# Patient Record
Sex: Female | Born: 1945 | Race: White | Hispanic: No | Marital: Married | Smoking: Former smoker
Health system: Southern US, Community
[De-identification: ages and names within clinical notes are randomized; demographics above are authoritative.]

## PROBLEM LIST (undated history)

## (undated) ENCOUNTER — Emergency Department (HOSPITAL_COMMUNITY): Payer: PPO | Source: Home / Self Care

## (undated) DIAGNOSIS — D649 Anemia, unspecified: Secondary | ICD-10-CM

## (undated) DIAGNOSIS — D329 Benign neoplasm of meninges, unspecified: Secondary | ICD-10-CM

## (undated) DIAGNOSIS — R918 Other nonspecific abnormal finding of lung field: Secondary | ICD-10-CM

## (undated) DIAGNOSIS — K900 Celiac disease: Secondary | ICD-10-CM

## (undated) DIAGNOSIS — R931 Abnormal findings on diagnostic imaging of heart and coronary circulation: Secondary | ICD-10-CM

## (undated) DIAGNOSIS — R9431 Abnormal electrocardiogram [ECG] [EKG]: Secondary | ICD-10-CM

## (undated) DIAGNOSIS — M810 Age-related osteoporosis without current pathological fracture: Secondary | ICD-10-CM

## (undated) DIAGNOSIS — Z8701 Personal history of pneumonia (recurrent): Secondary | ICD-10-CM

## (undated) DIAGNOSIS — Z9221 Personal history of antineoplastic chemotherapy: Secondary | ICD-10-CM

## (undated) DIAGNOSIS — R0600 Dyspnea, unspecified: Secondary | ICD-10-CM

## (undated) DIAGNOSIS — E785 Hyperlipidemia, unspecified: Secondary | ICD-10-CM

## (undated) DIAGNOSIS — I700 Atherosclerosis of aorta: Secondary | ICD-10-CM

## (undated) DIAGNOSIS — Z923 Personal history of irradiation: Secondary | ICD-10-CM

## (undated) DIAGNOSIS — B351 Tinea unguium: Secondary | ICD-10-CM

## (undated) DIAGNOSIS — C801 Malignant (primary) neoplasm, unspecified: Secondary | ICD-10-CM

## (undated) HISTORY — DX: Atherosclerosis of aorta: I70.0

## (undated) HISTORY — DX: Age-related osteoporosis without current pathological fracture: M81.0

## (undated) HISTORY — PX: ESOPHAGOGASTRODUODENOSCOPY: SHX1529

## (undated) HISTORY — DX: Abnormal electrocardiogram (ECG) (EKG): R94.31

## (undated) HISTORY — PX: LUNG LOBECTOMY: SHX167

## (undated) HISTORY — DX: Other nonspecific abnormal finding of lung field: R91.8

## (undated) HISTORY — PX: COLONOSCOPY: SHX174

## (undated) HISTORY — DX: Tinea unguium: B35.1

## (undated) HISTORY — DX: Benign neoplasm of meninges, unspecified: D32.9

## (undated) HISTORY — DX: Hyperlipidemia, unspecified: E78.5

## (undated) HISTORY — DX: Anemia, unspecified: D64.9

## (undated) HISTORY — DX: Abnormal findings on diagnostic imaging of heart and coronary circulation: R93.1

## (undated) HISTORY — DX: Personal history of pneumonia (recurrent): Z87.01

---

## 2009-05-26 ENCOUNTER — Ambulatory Visit: Payer: Self-pay | Admitting: Internal Medicine

## 2009-08-30 ENCOUNTER — Ambulatory Visit: Payer: Self-pay | Admitting: Unknown Physician Specialty

## 2009-08-31 LAB — PATHOLOGY REPORT

## 2010-09-14 ENCOUNTER — Ambulatory Visit: Payer: Self-pay | Admitting: Internal Medicine

## 2011-05-17 DIAGNOSIS — C801 Malignant (primary) neoplasm, unspecified: Secondary | ICD-10-CM

## 2011-05-17 HISTORY — DX: Malignant (primary) neoplasm, unspecified: C80.1

## 2011-06-01 ENCOUNTER — Ambulatory Visit: Payer: Self-pay | Admitting: Internal Medicine

## 2011-06-13 ENCOUNTER — Ambulatory Visit: Payer: Self-pay | Admitting: Internal Medicine

## 2011-06-15 ENCOUNTER — Ambulatory Visit: Payer: Self-pay | Admitting: Cardiothoracic Surgery

## 2011-06-17 ENCOUNTER — Ambulatory Visit: Payer: Self-pay | Admitting: Cardiothoracic Surgery

## 2011-06-29 LAB — CBC CANCER CENTER
Basophil #: 0.1 x10 3/mm (ref 0.0–0.1)
Basophil %: 1.3 %
Eosinophil %: 2.3 %
HCT: 38.2 % (ref 35.0–47.0)
Lymphocyte #: 2.3 x10 3/mm (ref 1.0–3.6)
Lymphocyte %: 34.2 %
MCHC: 32.5 g/dL (ref 32.0–36.0)
Monocyte #: 0.7 x10 3/mm (ref 0.2–0.9)
Neutrophil #: 3.5 x10 3/mm (ref 1.4–6.5)
Neutrophil %: 52.3 %
RBC: 4.11 10*6/uL (ref 3.80–5.20)
RDW: 14.6 % — ABNORMAL HIGH (ref 11.5–14.5)

## 2011-06-29 LAB — COMPREHENSIVE METABOLIC PANEL
Albumin: 3.8 g/dL (ref 3.4–5.0)
Alkaline Phosphatase: 98 U/L (ref 50–136)
Anion Gap: 6 — ABNORMAL LOW (ref 7–16)
Chloride: 101 mmol/L (ref 98–107)
Co2: 29 mmol/L (ref 21–32)
Creatinine: 0.78 mg/dL (ref 0.60–1.30)
EGFR (African American): >60
EGFR (Non-African Amer.): >60
Glucose: 95 mg/dL (ref 65–99)
SGPT (ALT): 27 U/L
Sodium: 136 mmol/L (ref 136–145)

## 2011-07-03 ENCOUNTER — Inpatient Hospital Stay: Payer: Self-pay

## 2011-07-04 LAB — CBC WITH DIFFERENTIAL/PLATELET
Basophil #: 0 10*3/uL (ref 0.0–0.1)
Basophil %: 0.4 %
Eosinophil #: 0 10*3/uL (ref 0.0–0.7)
Eosinophil %: 0.3 %
HCT: 30.3 % — ABNORMAL LOW (ref 35.0–47.0)
MCHC: 32.7 g/dL (ref 32.0–36.0)
Monocyte #: 0.7 x10 3/mm (ref 0.2–0.9)
Neutrophil %: 68.3 %
RDW: 14.2 % (ref 11.5–14.5)

## 2011-07-04 LAB — BASIC METABOLIC PANEL
Chloride: 107 mmol/L (ref 98–107)
EGFR (African American): >60

## 2011-07-05 LAB — CBC WITH DIFFERENTIAL/PLATELET
Basophil %: 0.4 %
Eosinophil #: 0.1 10*3/uL (ref 0.0–0.7)
Eosinophil %: 0.7 %
HCT: 27.3 % — ABNORMAL LOW (ref 35.0–47.0)
HGB: 9 g/dL — ABNORMAL LOW (ref 12.0–16.0)
Lymphocyte %: 11.3 %
MCHC: 33.1 g/dL (ref 32.0–36.0)
MCV: 93 fL (ref 80–100)
Monocyte #: 0.9 x10 3/mm (ref 0.2–0.9)
Monocyte %: 9 %
Platelet: 171 10*3/uL (ref 150–440)
RBC: 2.93 10*6/uL — ABNORMAL LOW (ref 3.80–5.20)
WBC: 9.4 10*3/uL (ref 3.6–11.0)

## 2011-07-05 LAB — BASIC METABOLIC PANEL
Calcium, Total: 7.4 mg/dL — ABNORMAL LOW (ref 8.5–10.1)
Creatinine: 0.73 mg/dL (ref 0.60–1.30)
EGFR (Non-African Amer.): >60
Glucose: 158 mg/dL — ABNORMAL HIGH (ref 65–99)
Sodium: 134 mmol/L — ABNORMAL LOW (ref 136–145)

## 2011-07-06 LAB — CBC WITH DIFFERENTIAL/PLATELET
Basophil %: 0.8 %
Eosinophil %: 2.9 %
Lymphocyte #: 2.1 10*3/uL (ref 1.0–3.6)
Monocyte %: 12.1 %
Platelet: 170 10*3/uL (ref 150–440)
RDW: 14.4 % (ref 11.5–14.5)
WBC: 7.8 10*3/uL (ref 3.6–11.0)

## 2011-07-07 LAB — BASIC METABOLIC PANEL
Chloride: 101 mmol/L (ref 98–107)
Co2: 27 mmol/L (ref 21–32)
Creatinine: 0.63 mg/dL (ref 0.60–1.30)
EGFR (African American): >60
Potassium: 3.6 mmol/L (ref 3.5–5.1)
Sodium: 136 mmol/L (ref 136–145)

## 2011-07-07 LAB — PLATELET COUNT: Platelet: 222 10*3/uL (ref 150–440)

## 2011-07-07 LAB — HEMATOCRIT: HCT: 28.6 % — ABNORMAL LOW (ref 35.0–47.0)

## 2011-07-08 LAB — CBC WITH DIFFERENTIAL/PLATELET
Basophil #: 0 10*3/uL (ref 0.0–0.1)
Eosinophil %: 2 %
HGB: 9.3 g/dL — ABNORMAL LOW (ref 12.0–16.0)
Lymphocyte #: 1.3 10*3/uL (ref 1.0–3.6)
MCH: 30.8 pg (ref 26.0–34.0)
MCHC: 33.4 g/dL (ref 32.0–36.0)
MCV: 92 fL (ref 80–100)
Monocyte #: 0.8 x10 3/mm (ref 0.2–0.9)
Neutrophil #: 4.1 10*3/uL (ref 1.4–6.5)
Neutrophil %: 64.8 %

## 2011-07-17 ENCOUNTER — Ambulatory Visit: Payer: Self-pay | Admitting: Oncology

## 2011-08-08 LAB — CBC CANCER CENTER
Basophil #: 0.1 x10 3/mm (ref 0.0–0.1)
Basophil %: 0.9 %
Eosinophil #: 0.1 x10 3/mm (ref 0.0–0.7)
HCT: 33.7 % — ABNORMAL LOW (ref 35.0–47.0)
HGB: 11 g/dL — ABNORMAL LOW (ref 12.0–16.0)
Lymphocyte %: 32.2 %
MCH: 30.3 pg (ref 26.0–34.0)
MCHC: 32.5 g/dL (ref 32.0–36.0)
MCV: 93 fL (ref 80–100)
Monocyte #: 0.7 x10 3/mm (ref 0.2–0.9)
Monocyte %: 10.7 %
Neutrophil #: 3.6 x10 3/mm (ref 1.4–6.5)
RDW: 14.5 % (ref 11.5–14.5)
WBC: 6.5 x10 3/mm (ref 3.6–11.0)

## 2011-08-08 LAB — BASIC METABOLIC PANEL
Anion Gap: 10 (ref 7–16)
BUN: 15 mg/dL (ref 7–18)
EGFR (Non-African Amer.): >60
Glucose: 89 mg/dL (ref 65–99)
Osmolality: 278 (ref 275–301)
Sodium: 139 mmol/L (ref 136–145)

## 2011-08-17 ENCOUNTER — Ambulatory Visit: Payer: Self-pay | Admitting: Oncology

## 2011-08-22 LAB — CBC CANCER CENTER
Basophil %: 0.6 %
Eosinophil %: 1.7 %
HCT: 32.1 % — ABNORMAL LOW (ref 35.0–47.0)
Lymphocyte #: 1.7 x10 3/mm (ref 1.0–3.6)
Lymphocyte %: 40.9 %
MCH: 31.3 pg (ref 26.0–34.0)
MCHC: 33.8 g/dL (ref 32.0–36.0)
MCV: 93 fL (ref 80–100)
Monocyte #: 0.6 x10 3/mm (ref 0.2–0.9)
Neutrophil #: 1.8 x10 3/mm (ref 1.4–6.5)
Neutrophil %: 43.3 %
Platelet: 274 x10 3/mm (ref 150–440)
RDW: 14.2 % (ref 11.5–14.5)

## 2011-08-29 LAB — CBC CANCER CENTER
Basophil #: 0.1 x10 3/mm (ref 0.0–0.1)
Basophil %: 1.4 %
Eosinophil %: 2.1 %
HGB: 10.7 g/dL — ABNORMAL LOW (ref 12.0–16.0)
MCH: 30.8 pg (ref 26.0–34.0)
MCV: 93 fL (ref 80–100)
Monocyte #: 1 x10 3/mm — ABNORMAL HIGH (ref 0.2–0.9)
Monocyte %: 18.4 %
Platelet: 404 x10 3/mm (ref 150–440)
RBC: 3.46 10*6/uL — ABNORMAL LOW (ref 3.80–5.20)

## 2011-09-05 LAB — CBC CANCER CENTER
Basophil #: 0.1 x10 3/mm (ref 0.0–0.1)
HGB: 11.2 g/dL — ABNORMAL LOW (ref 12.0–16.0)
Lymphocyte #: 2.2 x10 3/mm (ref 1.0–3.6)
MCH: 30.1 pg (ref 26.0–34.0)
MCV: 94 fL (ref 80–100)
Monocyte #: 0.7 x10 3/mm (ref 0.2–0.9)
Monocyte %: 10.1 %
Neutrophil %: 56.5 %
Platelet: 323 x10 3/mm (ref 150–440)
RDW: 15 % — ABNORMAL HIGH (ref 11.5–14.5)
WBC: 7.1 x10 3/mm (ref 3.6–11.0)

## 2011-09-05 LAB — COMPREHENSIVE METABOLIC PANEL
Albumin: 3.4 g/dL (ref 3.4–5.0)
Anion Gap: 10 (ref 7–16)
BUN: 13 mg/dL (ref 7–18)
Bilirubin,Total: 0.2 mg/dL (ref 0.2–1.0)
Creatinine: 0.85 mg/dL (ref 0.60–1.30)
EGFR (African American): >60
Glucose: 110 mg/dL — ABNORMAL HIGH (ref 65–99)
Potassium: 3.7 mmol/L (ref 3.5–5.1)
SGOT(AST): 33 U/L (ref 15–37)
Sodium: 136 mmol/L (ref 136–145)
Total Protein: 7.8 g/dL (ref 6.4–8.2)

## 2011-09-12 LAB — CBC CANCER CENTER
Eosinophil #: 0 x10 3/mm (ref 0.0–0.7)
HCT: 32.3 % — ABNORMAL LOW (ref 35.0–47.0)
Lymphocyte %: 36.4 %
MCHC: 33 g/dL (ref 32.0–36.0)
Monocyte #: 0.4 x10 3/mm (ref 0.2–0.9)
Monocyte %: 9.5 %
Neutrophil #: 2.3 x10 3/mm (ref 1.4–6.5)
Neutrophil %: 52 %
Platelet: 208 x10 3/mm (ref 150–440)
RDW: 14.3 % (ref 11.5–14.5)
WBC: 4.3 x10 3/mm (ref 3.6–11.0)

## 2011-09-17 ENCOUNTER — Ambulatory Visit: Payer: Self-pay | Admitting: Oncology

## 2011-09-19 LAB — CBC CANCER CENTER
Eosinophil %: 0.9 %
HCT: 32.2 % — ABNORMAL LOW (ref 35.0–47.0)
HGB: 10.6 g/dL — ABNORMAL LOW (ref 12.0–16.0)
Lymphocyte #: 1.8 x10 3/mm (ref 1.0–3.6)
MCH: 30.3 pg (ref 26.0–34.0)
MCHC: 32.8 g/dL (ref 32.0–36.0)
MCV: 93 fL (ref 80–100)
Monocyte #: 0.7 x10 3/mm (ref 0.2–0.9)
Monocyte %: 14.6 %
Neutrophil #: 2.3 x10 3/mm (ref 1.4–6.5)
Platelet: 239 x10 3/mm (ref 150–440)
RBC: 3.48 10*6/uL — ABNORMAL LOW (ref 3.80–5.20)
WBC: 4.9 x10 3/mm (ref 3.6–11.0)

## 2011-09-26 LAB — CBC CANCER CENTER
Basophil #: 0.1 x10 3/mm (ref 0.0–0.1)
Basophil %: 1.2 %
Eosinophil #: 0.1 x10 3/mm (ref 0.0–0.7)
HCT: 34.2 % — ABNORMAL LOW (ref 35.0–47.0)
HGB: 11.3 g/dL — ABNORMAL LOW (ref 12.0–16.0)
Lymphocyte #: 2 x10 3/mm (ref 1.0–3.6)
Lymphocyte %: 39.8 %
MCH: 30.3 pg (ref 26.0–34.0)
MCHC: 33 g/dL (ref 32.0–36.0)
MCV: 92 fL (ref 80–100)
Neutrophil #: 2 x10 3/mm (ref 1.4–6.5)
Platelet: 420 x10 3/mm (ref 150–440)
RBC: 3.72 10*6/uL — ABNORMAL LOW (ref 3.80–5.20)
WBC: 5 x10 3/mm (ref 3.6–11.0)

## 2011-10-03 LAB — CBC CANCER CENTER
Basophil #: 0.1 x10 3/mm (ref 0.0–0.1)
Eosinophil #: 0 x10 3/mm (ref 0.0–0.7)
HCT: 34.7 % — ABNORMAL LOW (ref 35.0–47.0)
Lymphocyte #: 2.3 x10 3/mm (ref 1.0–3.6)
Lymphocyte %: 32.5 %
MCH: 30.2 pg (ref 26.0–34.0)
MCHC: 32.3 g/dL (ref 32.0–36.0)
MCV: 94 fL (ref 80–100)
Monocyte %: 10.5 %
Neutrophil %: 55.1 %
Platelet: 272 x10 3/mm (ref 150–440)
RBC: 3.71 10*6/uL — ABNORMAL LOW (ref 3.80–5.20)
RDW: 14.9 % — ABNORMAL HIGH (ref 11.5–14.5)
WBC: 7 x10 3/mm (ref 3.6–11.0)

## 2011-10-03 LAB — COMPREHENSIVE METABOLIC PANEL
Albumin: 3.5 g/dL (ref 3.4–5.0)
Alkaline Phosphatase: 92 U/L (ref 50–136)
Bilirubin,Total: 0.2 mg/dL (ref 0.2–1.0)
Calcium, Total: 8.4 mg/dL — ABNORMAL LOW (ref 8.5–10.1)
EGFR (African American): >60
Glucose: 109 mg/dL — ABNORMAL HIGH (ref 65–99)
Osmolality: 276 (ref 275–301)
SGOT(AST): 36 U/L (ref 15–37)
SGPT (ALT): 40 U/L (ref 12–78)
Sodium: 137 mmol/L (ref 136–145)
Total Protein: 7.8 g/dL (ref 6.4–8.2)

## 2011-10-10 LAB — CBC CANCER CENTER
Basophil #: 0 x10 3/mm (ref 0.0–0.1)
Basophil %: 1 %
Eosinophil %: 0.7 %
HCT: 34 % — ABNORMAL LOW (ref 35.0–47.0)
HGB: 11 g/dL — ABNORMAL LOW (ref 12.0–16.0)
MCH: 30.4 pg (ref 26.0–34.0)
MCHC: 32.5 g/dL (ref 32.0–36.0)
MCV: 93 fL (ref 80–100)
Monocyte #: 0.5 x10 3/mm (ref 0.2–0.9)
Monocyte %: 11.3 %
RBC: 3.64 10*6/uL — ABNORMAL LOW (ref 3.80–5.20)

## 2011-10-17 ENCOUNTER — Ambulatory Visit: Payer: Self-pay | Admitting: Oncology

## 2011-10-17 LAB — CBC CANCER CENTER
Basophil #: 0 x10 3/mm (ref 0.0–0.1)
Eosinophil #: 0.1 x10 3/mm (ref 0.0–0.7)
Eosinophil %: 0.5 %
HCT: 32.6 % — ABNORMAL LOW (ref 35.0–47.0)
HGB: 10.6 g/dL — ABNORMAL LOW (ref 12.0–16.0)
Lymphocyte #: 2.2 x10 3/mm (ref 1.0–3.6)
Lymphocyte %: 23.1 %
Monocyte %: 9.9 %
Neutrophil %: 66.2 %
RBC: 3.53 10*6/uL — ABNORMAL LOW (ref 3.80–5.20)
WBC: 9.5 x10 3/mm (ref 3.6–11.0)

## 2011-10-24 LAB — CBC CANCER CENTER
Eosinophil #: 0.1 x10 3/mm (ref 0.0–0.7)
Eosinophil %: 0.8 %
HCT: 32 % — ABNORMAL LOW (ref 35.0–47.0)
Lymphocyte #: 2.5 x10 3/mm (ref 1.0–3.6)
Lymphocyte %: 30.4 %
MCV: 93 fL (ref 80–100)
Monocyte %: 19 %
Platelet: 379 x10 3/mm (ref 150–440)
RBC: 3.45 10*6/uL — ABNORMAL LOW (ref 3.80–5.20)
RDW: 15.9 % — ABNORMAL HIGH (ref 11.5–14.5)
WBC: 8.3 x10 3/mm (ref 3.6–11.0)

## 2011-10-31 LAB — CBC CANCER CENTER
Basophil #: 0.1 x10 3/mm (ref 0.0–0.1)
Eosinophil #: 0 x10 3/mm (ref 0.0–0.7)
HCT: 32.5 % — ABNORMAL LOW (ref 35.0–47.0)
Lymphocyte #: 1.8 x10 3/mm (ref 1.0–3.6)
Lymphocyte %: 28 %
MCH: 30.3 pg (ref 26.0–34.0)
MCHC: 32.4 g/dL (ref 32.0–36.0)
MCV: 94 fL (ref 80–100)
Monocyte #: 0.7 x10 3/mm (ref 0.2–0.9)
Monocyte %: 11.3 %
Neutrophil #: 3.7 x10 3/mm (ref 1.4–6.5)
RBC: 3.47 10*6/uL — ABNORMAL LOW (ref 3.80–5.20)
RDW: 15.4 % — ABNORMAL HIGH (ref 11.5–14.5)

## 2011-10-31 LAB — COMPREHENSIVE METABOLIC PANEL
Albumin: 3.1 g/dL — ABNORMAL LOW (ref 3.4–5.0)
Anion Gap: 18 — ABNORMAL HIGH (ref 7–16)
BUN: 14 mg/dL (ref 7–18)
Bilirubin,Total: 0.2 mg/dL (ref 0.2–1.0)
Calcium, Total: 8.5 mg/dL (ref 8.5–10.1)
EGFR (African American): >60
Glucose: 117 mg/dL — ABNORMAL HIGH (ref 65–99)
Potassium: 3.9 mmol/L (ref 3.5–5.1)
SGOT(AST): 25 U/L (ref 15–37)
SGPT (ALT): 26 U/L (ref 12–78)
Total Protein: 7.9 g/dL (ref 6.4–8.2)

## 2011-10-31 LAB — MAGNESIUM: Magnesium: 1.9 mg/dL

## 2011-11-07 LAB — BASIC METABOLIC PANEL
Anion Gap: 12 (ref 7–16)
Calcium, Total: 8.4 mg/dL — ABNORMAL LOW (ref 8.5–10.1)
Chloride: 101 mmol/L (ref 98–107)
Co2: 24 mmol/L (ref 21–32)
Creatinine: 0.87 mg/dL (ref 0.60–1.30)
EGFR (African American): >60
EGFR (Non-African Amer.): >60
Glucose: 120 mg/dL — ABNORMAL HIGH (ref 65–99)
Osmolality: 277 (ref 275–301)

## 2011-11-07 LAB — CBC CANCER CENTER
Basophil %: 0.9 %
Eosinophil %: 0.6 %
HGB: 10.5 g/dL — ABNORMAL LOW (ref 12.0–16.0)
Lymphocyte #: 1.5 x10 3/mm (ref 1.0–3.6)
MCV: 93 fL (ref 80–100)
Monocyte #: 0.3 x10 3/mm (ref 0.2–0.9)
Neutrophil #: 2.2 x10 3/mm (ref 1.4–6.5)
RBC: 3.45 10*6/uL — ABNORMAL LOW (ref 3.80–5.20)
WBC: 4.1 x10 3/mm (ref 3.6–11.0)

## 2011-11-17 ENCOUNTER — Ambulatory Visit: Payer: Self-pay | Admitting: Oncology

## 2011-12-12 LAB — CBC CANCER CENTER
Basophil #: 0.1 x10 3/mm (ref 0.0–0.1)
Basophil %: 1.1 %
Eosinophil #: 0.1 x10 3/mm (ref 0.0–0.7)
HCT: 34.8 % — ABNORMAL LOW (ref 35.0–47.0)
HGB: 11.5 g/dL — ABNORMAL LOW (ref 12.0–16.0)
Lymphocyte #: 1.8 x10 3/mm (ref 1.0–3.6)
Lymphocyte %: 30 %
MCH: 32.2 pg (ref 26.0–34.0)
MCHC: 33 g/dL (ref 32.0–36.0)
MCV: 98 fL (ref 80–100)
Monocyte #: 0.7 x10 3/mm (ref 0.2–0.9)
Monocyte %: 12.1 %
Neutrophil #: 3.3 x10 3/mm (ref 1.4–6.5)
Neutrophil %: 55.4 %
Platelet: 284 x10 3/mm (ref 150–440)
RDW: 17.6 % — ABNORMAL HIGH (ref 11.5–14.5)
WBC: 6 x10 3/mm (ref 3.6–11.0)

## 2011-12-17 ENCOUNTER — Ambulatory Visit: Payer: Self-pay | Admitting: Oncology

## 2011-12-19 LAB — CBC CANCER CENTER
Basophil #: 0.1 x10 3/mm (ref 0.0–0.1)
Basophil %: 1.3 %
Eosinophil #: 0.1 x10 3/mm (ref 0.0–0.7)
Eosinophil %: 1.1 %
HGB: 10.3 g/dL — ABNORMAL LOW (ref 12.0–16.0)
Lymphocyte #: 2 x10 3/mm (ref 1.0–3.6)
MCH: 32.5 pg (ref 26.0–34.0)
MCHC: 33.7 g/dL (ref 32.0–36.0)
MCV: 96 fL (ref 80–100)
Monocyte #: 0.8 x10 3/mm (ref 0.2–0.9)
Neutrophil %: 50.5 %
Platelet: 239 x10 3/mm (ref 150–440)
RBC: 3.17 10*6/uL — ABNORMAL LOW (ref 3.80–5.20)
RDW: 17.2 % — ABNORMAL HIGH (ref 11.5–14.5)
WBC: 5.8 x10 3/mm (ref 3.6–11.0)

## 2011-12-26 LAB — CBC CANCER CENTER
Eosinophil %: 1 %
Lymphocyte %: 26 %
Monocyte %: 13.8 %
Neutrophil %: 58.1 %
Platelet: 208 x10 3/mm (ref 150–440)
RBC: 3.24 10*6/uL — ABNORMAL LOW (ref 3.80–5.20)
WBC: 5.3 x10 3/mm (ref 3.6–11.0)

## 2012-01-02 LAB — CBC CANCER CENTER
Basophil #: 0 x10 3/mm (ref 0.0–0.1)
Eosinophil #: 0.1 x10 3/mm (ref 0.0–0.7)
Eosinophil %: 1.6 %
Lymphocyte %: 28.1 %
MCV: 97 fL (ref 80–100)
Monocyte #: 0.7 x10 3/mm (ref 0.2–0.9)
Monocyte %: 14.4 %
Neutrophil %: 55 %
Platelet: 207 x10 3/mm (ref 150–440)
RBC: 3.19 10*6/uL — ABNORMAL LOW (ref 3.80–5.20)
RDW: 15.6 % — ABNORMAL HIGH (ref 11.5–14.5)
WBC: 4.8 x10 3/mm (ref 3.6–11.0)

## 2012-01-16 LAB — CBC CANCER CENTER
Basophil #: 0.1 x10 3/mm (ref 0.0–0.1)
Basophil %: 1.3 %
Eosinophil #: 0.1 x10 3/mm (ref 0.0–0.7)
HCT: 32.3 % — ABNORMAL LOW (ref 35.0–47.0)
HGB: 10.7 g/dL — ABNORMAL LOW (ref 12.0–16.0)
Lymphocyte %: 28.5 %
MCHC: 33.1 g/dL (ref 32.0–36.0)
Monocyte %: 18.2 %
Neutrophil #: 2.5 x10 3/mm (ref 1.4–6.5)
Neutrophil %: 50.3 %
RBC: 3.33 10*6/uL — ABNORMAL LOW (ref 3.80–5.20)
WBC: 4.9 x10 3/mm (ref 3.6–11.0)

## 2012-01-17 ENCOUNTER — Ambulatory Visit: Payer: Self-pay | Admitting: Oncology

## 2012-01-23 LAB — CBC CANCER CENTER
Basophil #: 0 x10 3/mm (ref 0.0–0.1)
Eosinophil #: 0.1 x10 3/mm (ref 0.0–0.7)
Eosinophil %: 1.7 %
Lymphocyte %: 25.6 %
MCH: 32.6 pg (ref 26.0–34.0)
MCHC: 33.9 g/dL (ref 32.0–36.0)
Monocyte %: 16 %
Neutrophil #: 2.3 x10 3/mm (ref 1.4–6.5)
Neutrophil %: 55.7 %
Platelet: 216 x10 3/mm (ref 150–440)
RBC: 3.31 10*6/uL — ABNORMAL LOW (ref 3.80–5.20)
RDW: 14.9 % — ABNORMAL HIGH (ref 11.5–14.5)
WBC: 4.1 x10 3/mm (ref 3.6–11.0)

## 2012-02-06 LAB — COMPREHENSIVE METABOLIC PANEL
Alkaline Phosphatase: 106 U/L (ref 50–136)
Anion Gap: 8 (ref 7–16)
BUN: 16 mg/dL (ref 7–18)
Bilirubin,Total: 0.2 mg/dL (ref 0.2–1.0)
Calcium, Total: 8.7 mg/dL (ref 8.5–10.1)
Chloride: 102 mmol/L (ref 98–107)
Co2: 28 mmol/L (ref 21–32)
EGFR (African American): >60
EGFR (Non-African Amer.): >60
Glucose: 85 mg/dL (ref 65–99)
Osmolality: 276 (ref 275–301)
SGOT(AST): 38 U/L — ABNORMAL HIGH (ref 15–37)
SGPT (ALT): 33 U/L (ref 12–78)
Total Protein: 8.4 g/dL — ABNORMAL HIGH (ref 6.4–8.2)

## 2012-02-06 LAB — CBC CANCER CENTER
Basophil #: 0.1 x10 3/mm (ref 0.0–0.1)
Basophil %: 1.4 %
Eosinophil %: 2.3 %
HGB: 11.1 g/dL — ABNORMAL LOW (ref 12.0–16.0)
Lymphocyte #: 1 x10 3/mm (ref 1.0–3.6)
Lymphocyte %: 20.2 %
MCH: 32.7 pg (ref 26.0–34.0)
MCV: 96 fL (ref 80–100)
Monocyte #: 0.9 x10 3/mm (ref 0.2–0.9)
Neutrophil #: 3 x10 3/mm (ref 1.4–6.5)
Neutrophil %: 58.9 %
Platelet: 223 x10 3/mm (ref 150–440)
RBC: 3.39 10*6/uL — ABNORMAL LOW (ref 3.80–5.20)
WBC: 5.1 x10 3/mm (ref 3.6–11.0)

## 2012-02-17 ENCOUNTER — Ambulatory Visit: Payer: Self-pay | Admitting: Oncology

## 2012-03-16 ENCOUNTER — Ambulatory Visit: Payer: Self-pay | Admitting: Oncology

## 2012-05-30 ENCOUNTER — Ambulatory Visit: Payer: Self-pay | Admitting: Oncology

## 2012-06-03 ENCOUNTER — Ambulatory Visit: Payer: Self-pay | Admitting: Oncology

## 2012-06-06 LAB — COMPREHENSIVE METABOLIC PANEL
Anion Gap: 11 (ref 7–16)
BUN: 17 mg/dL (ref 7–18)
Bilirubin,Total: 0.3 mg/dL (ref 0.2–1.0)
Calcium, Total: 9.2 mg/dL (ref 8.5–10.1)
Creatinine: 0.97 mg/dL (ref 0.60–1.30)
EGFR (African American): >60
Osmolality: 282 (ref 275–301)
Potassium: 3.9 mmol/L (ref 3.5–5.1)
SGPT (ALT): 35 U/L (ref 12–78)
Sodium: 141 mmol/L (ref 136–145)

## 2012-06-06 LAB — CBC CANCER CENTER
Basophil %: 1.8 %
Eosinophil #: 0.2 x10 3/mm (ref 0.0–0.7)
Eosinophil %: 3.7 %
Lymphocyte #: 1.2 x10 3/mm (ref 1.0–3.6)
Lymphocyte %: 25.7 %
MCH: 29.7 pg (ref 26.0–34.0)
Monocyte #: 0.6 x10 3/mm (ref 0.2–0.9)
Monocyte %: 12.6 %
Neutrophil %: 56.2 %
Platelet: 222 x10 3/mm (ref 150–440)
RBC: 3.7 10*6/uL — ABNORMAL LOW (ref 3.80–5.20)
RDW: 15.4 % — ABNORMAL HIGH (ref 11.5–14.5)

## 2012-06-16 ENCOUNTER — Ambulatory Visit: Payer: Self-pay | Admitting: Oncology

## 2012-07-29 ENCOUNTER — Ambulatory Visit: Payer: Self-pay | Admitting: Unknown Physician Specialty

## 2012-09-05 ENCOUNTER — Ambulatory Visit: Payer: Self-pay | Admitting: Oncology

## 2012-09-16 ENCOUNTER — Ambulatory Visit: Payer: Self-pay | Admitting: Oncology

## 2012-11-27 ENCOUNTER — Ambulatory Visit: Payer: Self-pay | Admitting: Oncology

## 2012-12-10 ENCOUNTER — Ambulatory Visit: Payer: Self-pay | Admitting: Oncology

## 2012-12-10 LAB — CBC CANCER CENTER
Basophil #: 0.1 x10 3/mm (ref 0.0–0.1)
Eosinophil %: 3.3 %
Lymphocyte %: 22 %
MCHC: 33.2 g/dL (ref 32.0–36.0)
MCV: 93 fL (ref 80–100)
Monocyte #: 0.6 x10 3/mm (ref 0.2–0.9)
RBC: 3.73 10*6/uL — ABNORMAL LOW (ref 3.80–5.20)
RDW: 13 % (ref 11.5–14.5)

## 2012-12-10 LAB — COMPREHENSIVE METABOLIC PANEL
Albumin: 3.6 g/dL (ref 3.4–5.0)
Alkaline Phosphatase: 76 U/L
Anion Gap: 8 (ref 7–16)
BUN: 19 mg/dL — ABNORMAL HIGH (ref 7–18)
Bilirubin,Total: 0.4 mg/dL (ref 0.2–1.0)
Calcium, Total: 9.2 mg/dL (ref 8.5–10.1)
Chloride: 103 mmol/L (ref 98–107)
Co2: 28 mmol/L (ref 21–32)
Creatinine: 0.97 mg/dL (ref 0.60–1.30)
EGFR (Non-African Amer.): 60 — ABNORMAL LOW
Glucose: 89 mg/dL (ref 65–99)
Osmolality: 279 (ref 275–301)
Potassium: 3.8 mmol/L (ref 3.5–5.1)
Total Protein: 8.4 g/dL — ABNORMAL HIGH (ref 6.4–8.2)

## 2012-12-16 ENCOUNTER — Ambulatory Visit: Payer: Self-pay | Admitting: Oncology

## 2012-12-16 LAB — PROT IMMUNOELECTROPHORES(ARMC)

## 2013-01-16 ENCOUNTER — Ambulatory Visit: Payer: Self-pay | Admitting: Oncology

## 2013-01-16 ENCOUNTER — Ambulatory Visit: Payer: Self-pay | Admitting: Internal Medicine

## 2013-03-05 ENCOUNTER — Ambulatory Visit: Payer: Self-pay | Admitting: Oncology

## 2013-03-16 ENCOUNTER — Ambulatory Visit: Payer: Self-pay | Admitting: Oncology

## 2013-05-21 DIAGNOSIS — Z859 Personal history of malignant neoplasm, unspecified: Secondary | ICD-10-CM | POA: Insufficient documentation

## 2013-05-21 DIAGNOSIS — D649 Anemia, unspecified: Secondary | ICD-10-CM | POA: Insufficient documentation

## 2013-05-21 DIAGNOSIS — E785 Hyperlipidemia, unspecified: Secondary | ICD-10-CM | POA: Insufficient documentation

## 2013-06-17 ENCOUNTER — Ambulatory Visit: Payer: Self-pay | Admitting: Oncology

## 2013-09-02 ENCOUNTER — Ambulatory Visit: Payer: Self-pay | Admitting: Radiation Oncology

## 2013-09-16 ENCOUNTER — Ambulatory Visit: Payer: Self-pay | Admitting: Radiation Oncology

## 2013-09-17 ENCOUNTER — Ambulatory Visit: Payer: Self-pay

## 2013-10-16 ENCOUNTER — Ambulatory Visit: Payer: Self-pay | Admitting: Radiation Oncology

## 2013-11-26 DIAGNOSIS — M810 Age-related osteoporosis without current pathological fracture: Secondary | ICD-10-CM | POA: Insufficient documentation

## 2014-03-10 ENCOUNTER — Ambulatory Visit: Payer: Self-pay | Admitting: Oncology

## 2014-03-17 ENCOUNTER — Ambulatory Visit
Admit: 2014-03-17 | Disposition: A | Discharge status: Home / Self Care | Payer: Self-pay | Attending: Oncology | Admitting: Oncology

## 2014-04-17 ENCOUNTER — Ambulatory Visit
Admit: 2014-04-17 | Disposition: A | Discharge status: Home / Self Care | Payer: Self-pay | Attending: Oncology | Admitting: Oncology

## 2014-05-01 ENCOUNTER — Other Ambulatory Visit: Payer: Self-pay | Admitting: Oncology

## 2014-05-01 DIAGNOSIS — C3410 Malignant neoplasm of upper lobe, unspecified bronchus or lung: Secondary | ICD-10-CM

## 2014-05-05 NOTE — Consult Note (Signed)
Reason for Visit: This 69 year old Female patient presents to the clinic for initial evaluation of  Lung cancer .   Referred by Dr. Oliva Bustard.  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 She went on to have right upper lobectomy in portion of right lower lobe resected for a grade 2 invasive adenocarcinoma measuring 3.3 cm. Parietal and visceral pleural were involved. Pleural margin was 0.3 mm.5 lymph nodes were sampled at the time of surgery all negative for metastatic disease. Patient tolerated her surgery well and is undergone adjuvant 4 cycles of cis-platinum Alimta chemotherapy. Case is been discussed with surgical oncology as well as medical oncology and adjuvant radiation therapy opinion is sought. Patient is doing well at this time. She tolerated her chemotherapy extremely well. She has no cough hemoptysis or chest tightness at this time. She is having no problems with dyspnea on exertion.  Past Hx:    Lung cancer:    None: None  Past, Family and Social History:   Past Medical History positive    Respiratory Lung cancer as described above    Past Surgical History Lobectomy as described above    Family History noncontributory    Social History noncontributory    Additional Past Medical and Surgical History Patient is accompanied with her husband today. She is a nonsmoker   Allergies:   No  Known Allergies:   Home Meds:  Home Medications: Medication Instructions Status  Reglan 10 mg oral tablet 1 tab(s) orally 2 times a day Active  multivitamin 1 tab(s) orally once a day Active  folic acid 1 mg oral tablet 1 tab(s) orally once a day Active   Review of Systems:   General negative    Performance Status (ECOG) 0    Skin negative    Breast negative    Ophthalmologic negative    ENMT negative    Respiratory and Thorax see HPI    Cardiovascular negative    Gastrointestinal negative    Genitourinary negative    Musculoskeletal negative    Neurological negative    Psychiatric negative    Hematology/Lymphatics negative    Endocrine negative    Allergic/Immunologic negative    Review of Systems   as per nurse's notesPatient denies any weight loss, fatigue, weakness, fever, chills or night sweats. Patient denies any loss of vision, blurred vision. Patient denies any ringing  of the ears or hearing loss. No irregular heartbeat. Patient denies heart murmur or history of fainting. Patient denies any chest pain or pain radiating to her upper extremities. Patient denies any shortness of breath, difficulty breathing at night, cough or hemoptysis. Patient denies any swelling in the lower legs. Patient denies any nausea vomiting, vomiting of blood, or coffee ground material in the vomitus. Patient denies any stomach pain. Patient states has had normal bowel movements no significant constipation or diarrhea. Patient denies any dysuria, hematuria or significant  nocturia. Patient denies any problems walking, swelling in the joints or loss of balance. Patient denies any skin changes, loss of hair or loss of weight. Patient denies any excessive worrying or anxiety or significant depression. Patient denies any problems with insomnia. Patient denies excessive thirst, polyuria, polydipsia. Patient denies any swollen glands, patient denies easy bruising or easy bleeding. Patient denies  any recent infections, allergies or URI. Patient "s visual fields have not changed significantly in recent time.  Nursing Notes:  Nursing Vital Signs and Chemo Nursing Nursing Notes: *CC Vital Signs Flowsheet:   07-Nov-13 10:19   Temp Temperature 98.9   Pulse Pulse 69   Respirations Respirations 20   SBP SBP 159   DBP DBP 82   Pain Scale (0-10)  0   Current Weight (kg) (kg) 54.2   Height (cm) centimeters 168   BSA (m2) 1.6   Physical Exam:  General/Skin/HEENT:   General normal    Skin normal    Eyes normal    ENMT normal    Head and Neck normal    Additional PE Well-developed well-nourished female in NAD. There is no cervical or supraclavicular adenopathy identified. Lungs are clear to A&P cardiac examination shows regular rate and rhythm. Thoracotomy scar is well-healed. Abdomen is benign with no organomegaly or masses noted.   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  Plan:   I discussed the case with medical oncology and thoracic oncology. Based on the close margin as well as invasion of the visceral parietal pleura believe she would benefit from adjuvant radiation therapy treating the area of original known tumor involvement by PET CT criteria. Would plan on delivering 6000 cGy over 6 weeks that region. Will not treat her draining lymph nodes since 5 lymph nodes were negative for metastatic disease on surgical resection. Risks and benefits of treatment including some damage to normal lung volume, fatigue, possible skin irritation, possible alteration blood counts were all explained  in detail to the patient. I would like to use IMRT treatment planning and delivery to spare as much normal lung volume as possible. I have set the patient up for CT simulation early next week.  I would like to take this opportunity to thank you for allowing me to continue to participate in this patient's care.  CC Referral:   cc: Dr. Ramonita Lab   Electronic Signatures: Baruch Gouty, Roda Shutters (MD)  (Signed [phone] 12:26)  Authored: HPI, Diagnosis, Past Hx, PFSH, Allergies, Home Meds, ROS, Nursing Notes, Physical Exam, Encounter Assessment and Plan, CC Referring Physician   Last Updated: 07-Nov-13 12:26 by Armstead Peaks (MD)

## 2014-05-10 NOTE — Consult Note (Signed)
PATIENT NAME:  Krista Evans, Krista Evans MR#:  [id number] DATE OF BIRTH:  02-13-1945  DATE OF CONSULTATION:  07/04/2011  REFERRING PHYSICIAN:  Dr. Genevive Bi  CONSULTING PHYSICIAN:  Aldrick Derrig A. Posey Pronto, MD  PRIMARY CARE PHYSICIAN: Dr. Ramonita Lab   REASON FOR CONSULTATION: Postoperative medical management.   HISTORY OF PRESENT ILLNESS: Krista Evans is a 69 year old Caucasian female with no past medical history was admitted by Dr. Genevive Bi and is status post elective right-sided thoracotomy with right upper lobe lobectomy and right lower lobe wedge resection for presumed lung cancer. Patient is admitted postoperative in the Intensive Care Unit along with pain management. She is currently awake, following commands and denies any shortness of breath or wheezing. She is currently on an epidural in place followed by anesthesiology with IV fentanyl. She had a little bit of low blood pressure for which she got bolus of IV fluids and is asymptomatic. Her oxygen has been weaned off and she does continue to have a right-sided chest tube placed postoperatively.   PAST MEDICAL HISTORY: Right upper lobe lung mass.   FAMILY HISTORY: No family members with coronary artery disease.   SOCIAL HISTORY: Denies alcohol or tobacco use but has history of remote use of tobacco.   ALLERGIES: No known drug allergies.   HOME MEDICATIONS: Multivitamin p.o. daily.   REVIEW OF SYSTEMS: CONSTITUTIONAL: No fever, fatigue, weakness. EYES: No blurred or double vision. ENT: No tinnitus, ear pain, hearing loss. RESPIRATORY: No shortness of breath, wheeze, hemoptysis. CARDIOVASCULAR: Minimal chest pain at chest tube site. No palpitations or hypertension. GASTROINTESTINAL: No nausea, vomiting, diarrhea, or abdominal pain. GENITOURINARY: No dysuria, hematuria. ENDOCRINE: No polyuria, nocturia. HEMATOLOGY: No anemia or easy bruising. SKIN: No acne, rash. MUSCULOSKELETAL: No arthritis. NEUROLOGIC: No cerebrovascular accident, transient ischemic attack. PSYCH:  No anxiety or depression. All other systems reviewed and negative.   PHYSICAL EXAMINATION:  GENERAL: Patient is awake, alert, oriented x3, not in acute distress.   VITAL SIGNS: Afebrile, pulse 76, blood pressure 105/55, sats 94% on 1 liter.   HEENT: Atraumatic, normocephalic. Pupils are equal, round, and reactive to light and accommodation. Extraocular movements intact. Oral mucosa is moist.   NECK: Supple. No JVD. No carotid bruit.   RESPIRATORY: Clear to auscultation bilaterally. No rales, rhonchi, respiratory distress, or labored breathing.   CARDIOVASCULAR: Both the heart sounds are normal. Rate, rhythm is regular. PMI not lateralized. Chest nontender.   EXTREMITIES: Good pedal pulses, good femoral pulses. No lower extremity edema.   ABDOMEN: Soft, benign, nontender. No organomegaly. Positive bowel sounds.   NEUROLOGIC: Grossly intact cranial nerves II through XII. No motor or sensory deficit.   SKIN: Warm and dry.   LABORATORY, DIAGNOSTIC AND RADIOLOGICAL DATA:  Chest x-ray: Persistent small right pneumothorax, possible interstitial edema in the left lung.   White count 7.8, hemoglobin and hematocrit 9.9 and 30.3, platelet count 195. Basic metabolic panel within normal limits except calcium of 7.5.   Echo Doppler showed LVEF was 50%. There is mild to moderate MR. There is mild tricuspid regurgitation. No aortic regurgitation. EKG showed normal sinus rhythm. T wave inversion in inferior leads. Total protein 8.9. Rest of LFTs within normal limits. PT-INR 11.9 and 0.8.   ASSESSMENT: 69 year old Krista Evans with:  1. Hypotension postoperative day which improved with IV fluids. I added p.r.n. Ultram. Echo noted with ejection fraction of 50% with mild MR and TR.  2. Status post right upper lobe lobectomy, right lower lobe wedge resection for presumed right upper lobe lung mass/malignancy. Patient  is status post right-sided chest tube placement, managed by Dr. Genevive Bi.  3. Anemia.  Continue to monitor. Transfuse as needed.  4. Deep vein thrombosis prophylaxis. Continue with heparin.  5. Patient should be able to be transferred out to surgical floor later today if okay with Dr. Genevive Bi and Dr. Mortimer Fries.   Thank you for the consult. Will follow while patient is in house.   TIME SPENT: 50 minutes.  ____________________________ Hart Rochester Posey Pronto, MD sap:cms D: 07/04/2011 83:07:46 ET T: 07/04/2011 14:01:43 ET JOB#: [id number]  cc: Sulaiman Imbert A. Posey Pronto, MD, <Dictator> Adin Hector, MD Lew Dawes. Genevive Bi, MD Mariane Duval, MD  Ilda Basset MD ELECTRONICALLY SIGNED 07/08/2011 15:46

## 2014-05-10 NOTE — Discharge Summary (Signed)
PATIENT NAME:  ELESE, RANE MR#:  [id number] DATE OF BIRTH:  08-26-1945  DATE OF ADMISSION:  07/03/2011 DATE OF DISCHARGE:  07/08/2011  ADMITTING DIAGNOSIS: Right upper lobe mass.   DISCHARGE DIAGNOSIS: Adenocarcinoma right upper lobes pathologically staged as T3 N0 M0.   HOSPITAL COURSE: Ms. Zarazua is a 69 year old female with a history of tobacco use in the past. She was recently found to have a right upper lobe mass and was admitted to the hospital on 07/03/2011 where she was taken to the operating room and underwent a right upper lobectomy with partial chest wall resection as well as right lower lobe wedge resection. Her postoperative course was complicated by a slight air leak. However, she was able have her chest tubes removed on June 22nd following which a chest x-ray showed no change. Her wounds were clean and dry and she was discharged to home. She was scheduled for follow-up in one week. At the time of discharge she was given a prescription for Norco 5/325 to take as needed.   ____________________________ Lew Dawes Genevive Bi, MD teo:drc D: 07/10/2011 13:57:47 ET T: 07/11/2011 11:11:11 ET JOB#: [id number]  cc: Christia Reading E. Genevive Bi, MD, <Dictator> Louis Matte MD ELECTRONICALLY SIGNED 07/26/2011 9:40

## 2014-05-10 NOTE — Consult Note (Signed)
PATIENT NAME:  Krista Evans, STREED MR#:  [id number] DATE OF BIRTH:  1945/10/21  DATE OF CONSULTATION:  07/03/2011  REFERRING PHYSICIAN:  Lucilla Edin, MD  CONSULTING PHYSICIAN:  Corey Skains, MD PRIMARY CARE PHYSICIAN: Tama High, III, MD   REASON FOR CONSULTATION: EKG changes with known lung disease and cardiovascular risk, has lung cancer.   HISTORY OF PRESENT ILLNESS: The patient is a 69 year old female with no history of cardiovascular disease in the past, having lung cancer and possible need for lung surgery. The patient has had only minimal shortness of breath with some physical activity on occasion but nothing significantly new consistent with angina or congestive heart failure. The patient has had no history of hypertension, hyperlipidemia, or any other cardiovascular issue requiring further intervention. The patient now has had an EKG for preoperative assessment showing normal sinus rhythm and T wave inversions concerning for cardiovascular disease. In addition to that, the patient had an echocardiogram showing normal LV systolic function with minor valvular heart disease but no evidence of previous myocardial infarction or congestive heart failure; therefore, the patient is likely at lower risk.   REVIEW OF SYSTEMS: The remainder of review of systems is negative for vision change, ringing in the ears, hearing loss, cough, congestion, heartburn, nausea, vomiting, diarrhea, bloody stools, stomach pain, extremity pain, leg weakness, cramping of the buttocks, known blood clots, headaches, blackouts, dizzy spells, nosebleeds, congestion, trouble swallowing, frequent urination, urination at night, muscle weakness, numbness, anxiety, depression, skin lesions, or skin rashes.   PAST MEDICAL HISTORY: Lung cancer.   FAMILY HISTORY: No family members with early onset of cardiovascular disease.   SOCIAL HISTORY: Currently denies alcohol or tobacco use but has remote tobacco use.   ALLERGIES: No  known drug allergies.   CURRENT MEDICATIONS: As listed.   PHYSICAL EXAMINATION:  VITAL SIGNS: Blood pressure 126/68 bilaterally, heart rate 70 upright, reclining, and regular.   GENERAL: She is a well-appearing female in no acute distress.   HEENT: No icterus, thyromegaly, ulcers, hemorrhage, or xanthelasma.   CARDIOVASCULAR: Regular rate and rhythm. Normal S1 and S2 without murmur, gallop, or rub. Point of maximal impulse is normal size and placement. Carotid upstroke normal without bruit. Jugular venous pressure is normal.   LUNGS: Lungs have a few basilar crackles with a few wheezes.   ABDOMEN: Soft, nontender, without hepatosplenomegaly or masses. Abdominal aorta is normal size without bruit.   EXTREMITIES: 2+ bilateral pulses in dorsal, pedal, radial, and femoral arteries without lower extremity edema, cyanosis, clubbing, or ulcers.   NEUROLOGIC: She is oriented to time, place, and person with normal mood and affect.   ASSESSMENT: The patient is a 69 year old female with abnormal EKG and minimal shortness of breath but no evidence of angina or congestive heart failure, with an echocardiogram showing normal LV systolic function and minimal valvular heart disease, at lowest risk possible for cardiovascular complication with lung surgery.   RECOMMENDATIONS:  1. Proceed to lung surgery due to low risk. The patient will have monitoring postoperatively but no evidence of need in adjustment or addition of medications or other diagnostic testing necessary. The patient will not have any restrictions to rehabilitation.  2. Further treatment options after recovery.  ____________________________ Corey Skains, MD bjk:cbb D: 07/03/2011 12:15:39 ET T: 07/03/2011 12:42:02 ET JOB#: [id number] cc: Corey Skains, MD, <Dictator> Corey Skains MD ELECTRONICALLY SIGNED 07/05/2011 10:41

## 2014-05-10 NOTE — Consult Note (Signed)
Reason for Visit: This 69 year old Female patient presents to the clinic for initial evaluation of  Lung cancer .   Referred by Dr. Oliva Bustard.  Diagnosis:   Chief Complaint/Diagnosis   69 year old female status post wedge resection for adenocarcinoma of the right lower lobe adenocarcinoma stage IIB (T3, N0, M0)   Pathology Report Pathology report reviewed    Imaging Report CT scans and PET CT scan reviewed    Referral Report Clinical notes reviewed    Planned Treatment Regimen Adjuvant postoperative radiation    HPI   patient is a 69 year old female who presented with a several month history of dry hacking cough. She was treated with empiric antibiotics with no significant improvement. Chest x-ray was abnormal followed by CT scan. She was found to have a right lower lobe mass with partial invasion of the chest wall. She underwent wedge resectionshowing a 3.3 cm invasive adenocarcinoma with invasion of the parietal pleura. Margins were clear but close at 0.3 mm. 2 lobar lymph nodes were negative. A hilar lymph node was also negative.5 lymph nodes all were examined all negative for metastatic disease. Because of chest wall invasion this was a pathologic T3 lesion. She has done well postoperatively. She specifically denies hacking cough at this time. Her by mouth intake is good and she does have some right lateral chest wall tenderness secondary to surgery although this is improving.  Past Hx:    None: None  Past, Family and Social History:   Past Medical History noncontributory    Family History noncontributory    Social History noncontributory    Additional Past Medical and Surgical History Accompanied by husband today   Allergies:   No Known Allergies:   Home Meds:  Home Medications: Medication Instructions Status  multivitamin 1 tab(s) orally once a day Active  tylenol hydrocodone 5/325 1 tab(s) orally once a day Active   Review of Systems:   General negative     Performance Status (ECOG) 0    Skin negative    Breast negative    Ophthalmologic negative    ENMT negative    Respiratory and Thorax see HPI    Cardiovascular negative    Gastrointestinal negative    Genitourinary negative    Musculoskeletal negative    Neurological negative    Psychiatric negative    Hematology/Lymphatics negative    Endocrine negative    Allergic/Immunologic negative   Nursing Notes:  Nursing Vital Signs and Chemo Nursing Nursing Notes: *CC Vital Signs Flowsheet:   11-Jul-13 14:47   Temp Temperature 97.8   Pulse Pulse 62   Respirations Respirations 16   SBP SBP 110   DBP DBP 67   Pain Scale (0-10)  0   Current Weight (kg) (kg) 55   Height (cm) centimeters 168   BSA (m2) 1.6   Physical Exam:  General/Skin/HEENT:   General normal    Skin normal    Eyes normal    ENMT normal    Head and Neck normal    Additional PE Well-developed thin female in NAD. She is multiple laparoscopic incisions which are well healed. Lungs are clear to A&P cardiac examination shows regular rate and rhythm. Abdomen is benign with no organomegaly or masses noted.   Breasts/Resp/CV/GI/GU:   Respiratory and Thorax normal    Cardiovascular normal    Gastrointestinal normal    Genitourinary normal   MS/Neuro/Psych/Lymph:   Musculoskeletal normal    Neurological normal    Lymphatics normal   Assessment  and Plan:  Impression:   stage IIB adenocarcinoma of the right lower lobe status post resection with extremely close margin in 69 year old female  Plan:   case was presented at our weekly tumor conference. Based on extremely close margin surgical oncology and medical oncology and radiation oncology all believe adjuvant postoperative radiation therapy would be indicated. I have ordered this to the patient. I would plan on delivering 5000 cGy over 5 weeks using three-dimensional treatment planning. Concurrent chemotherapy may be an option and I will  discuss the case again with Dr. Oliva Bustard. Patient is going away to the beach and allow her in a few more weeks to heal and start treatment planning in early August. Risks and benefits of treatment includingloss of normal lung volume, skin reaction, fatigue and alteration of blood counts were all explained in detail to the patient and her husband. Situation appointment was given.  I would like to take this opportunity to thank you for allowing me to continue to participate in this patient's care.  CC Referral:   cc: Dr. Ramonita Lab, Dr. Raul Del   Electronic Signatures: Baruch Gouty, Roda Shutters (MD)  (Signed 11-Jul-13 15:45)  Authored: HPI, Diagnosis, Past Hx, PFSH, Allergies, Home Meds, ROS, Nursing Notes, Physical Exam, Encounter Assessment and Plan, CC Referring Physician   Last Updated: 11-Jul-13 15:45 by Armstead Peaks (MD)

## 2014-05-10 NOTE — Op Note (Signed)
PATIENT NAME:  Krista Evans, Krista Evans MR#:  [id number] DATE OF BIRTH:  Oct 15, 1945  DATE OF PROCEDURE:  07/03/2011  PREOPERATIVE DIAGNOSIS: Right upper lobe mass.   POSTOPERATIVE DIAGNOSIS: Right upper lobe mass.  OPERATION PERFORMED:  1. Preoperative bronchoscopy to assess endobronchial anatomy.  2. Right thoracotomy with extensive lysis of adhesions; right upper lobectomy with wedge resection right lower lobe (en bloc).   SURGEON: Hercules Hasler E. Genevive Bi, MD  ASSISTANT: Rodena Goldmann, III, MD   INDICATIONS FOR PROCEDURE: Krista Evans is a 69 year old white female with a previous long-standing smoking history. She was recently found to have a right upper lobe mass. This was evaluated with a chest CT and PET scan. This was highly suspicious for malignancy, and she was offered the above-named procedure for definitive diagnosis and treatment.   DESCRIPTION OF PROCEDURE: The patient was brought to the operating suite and placed in the supine position. General endotracheal anesthesia was given through a double-lumen tube. Preoperative bronchoscopy was carried out. The distal trachea and all endobronchial anatomy was normal. There was no evidence of tumor. The patient was then rolled into the decubitus position for a right thoracotomy. All pressure points were carefully padded. The patient was prepped and draped in the usual sterile fashion. A posterolateral fifth interspace thoracotomy was performed. The chest was entered. The serratus muscle was spared. The pleural space was obliterated with adhesions. These required extensive dissection. The thoracoscope was brought onto the field to give [place] better visualization. Once we had had the entire lung freed up, we then identified the tumor. The tumor was located in the upper lobe which crossed the fissure into the lower lobe. While I was opening the chest, I could feel the tumor and went extrapleural to be certain that we got the entire tumor. Once this was complete, we  could then feel that there were no other lesions in the lung. We started by dividing the inferior pulmonary ligament. We identified the middle lobe and upper lobe veins. We opened the fissure posteriorly and completed the fissure using a stapling device. This was done in such a manner as to include the generous portion of the right lower lobe superior segment. This then left [place] with the upper lobe with a small portion of the lower lobe attached. We then identified the arterial branches to the upper lobe, and these were taken with vascular staplers. The venous branches of the upper lobe were also taken with a vascular stapler. The bronchus was the only remaining structure, and the lymph nodes surrounding the bronchus were swept up into the specimen, and the bronchus was secured with a green load on the endoscopic stapler. The middle and lower lobes ventilated quite nicely. The bronchus was transected and checked at 30 cm of water pressure. There was no air leak. There were some air leaks along the cut surfaces of the parenchyma. We then copiously irrigated the chest and secured all hemostasis. The pulmonary arterial branch required one single 6-0 Prolene. The extrapleural dissection on the right upper lobe was marked with a multitude of small clips. This was used to identify this area and if there was any potential for postoperative radiation therapy. We then opened the pretracheal space and took the lymph nodes in the pretracheal space, including the R2 and R4 nodes. These were sent. The chest was drained with two chest tubes. An angled and a straight tube were inserted. The chest was then closed with #2 Vicryl paracostal sutures. The thoracoscopy port was closed  with multiple layers of running absorbable sutures. Nylon was used on the skin. On the thoracotomy wound, the subcutaneous tissues were closed with 2-0 Vicryl and the skin with skin clips. Sterile dressings were applied. The patient was then rolled in the  supine position where she was extubated and taken to the recovery room in stable condition.   ____________________________ Lew Dawes Genevive Bi, MD teo:cbb D: 07/03/2011 17:24:08 ET T: 07/03/2011 17:34:34 ET JOB#: [id number]  cc: Christia Reading E. Genevive Bi, MD, <Dictator> Adin Hector, MD Micheline Maze, MD Louis Matte MD ELECTRONICALLY SIGNED 07/06/2011 14:33

## 2014-06-22 ENCOUNTER — Ambulatory Visit
Admission: RE | Admit: 2014-06-22 | Discharge: 2014-06-22 | Disposition: A | Discharge status: Home / Self Care | Payer: PPO | Source: Ambulatory Visit | Attending: Oncology | Admitting: Oncology

## 2014-06-22 DIAGNOSIS — Z87891 Personal history of nicotine dependence: Secondary | ICD-10-CM | POA: Insufficient documentation

## 2014-06-22 DIAGNOSIS — C3410 Malignant neoplasm of upper lobe, unspecified bronchus or lung: Secondary | ICD-10-CM

## 2014-06-22 DIAGNOSIS — Z902 Acquired absence of lung [part of]: Secondary | ICD-10-CM | POA: Diagnosis not present

## 2014-06-22 DIAGNOSIS — I2510 Atherosclerotic heart disease of native coronary artery without angina pectoris: Secondary | ICD-10-CM | POA: Insufficient documentation

## 2014-06-22 HISTORY — DX: Malignant (primary) neoplasm, unspecified: C80.1

## 2014-07-21 ENCOUNTER — Inpatient Hospital Stay: Payer: PPO | Attending: Oncology

## 2014-07-21 ENCOUNTER — Other Ambulatory Visit: Payer: Self-pay | Admitting: *Deleted

## 2014-07-21 DIAGNOSIS — Z800 Family history of malignant neoplasm of digestive organs: Secondary | ICD-10-CM | POA: Diagnosis not present

## 2014-07-21 DIAGNOSIS — Z9221 Personal history of antineoplastic chemotherapy: Secondary | ICD-10-CM | POA: Insufficient documentation

## 2014-07-21 DIAGNOSIS — C3490 Malignant neoplasm of unspecified part of unspecified bronchus or lung: Secondary | ICD-10-CM

## 2014-07-21 DIAGNOSIS — I7090 Unspecified atherosclerosis: Secondary | ICD-10-CM | POA: Insufficient documentation

## 2014-07-21 DIAGNOSIS — Z87891 Personal history of nicotine dependence: Secondary | ICD-10-CM | POA: Diagnosis not present

## 2014-07-21 DIAGNOSIS — Z79899 Other long term (current) drug therapy: Secondary | ICD-10-CM | POA: Diagnosis not present

## 2014-07-21 DIAGNOSIS — Z85118 Personal history of other malignant neoplasm of bronchus and lung: Secondary | ICD-10-CM | POA: Diagnosis not present

## 2014-07-21 DIAGNOSIS — Z923 Personal history of irradiation: Secondary | ICD-10-CM | POA: Insufficient documentation

## 2014-07-21 LAB — CBC WITH DIFFERENTIAL/PLATELET
BASOS ABS: 0.1 10*3/uL (ref 0–0.1)
BASOS PCT: 1 %
EOS ABS: 0.2 10*3/uL (ref 0–0.7)
EOS PCT: 3 %
HEMATOCRIT: 33.6 % — AB (ref 35.0–47.0)
Hemoglobin: 11.1 g/dL — ABNORMAL LOW (ref 12.0–16.0)
Lymphocytes Relative: 31 %
Lymphs Abs: 1.8 10*3/uL (ref 1.0–3.6)
MCH: 30 pg (ref 26.0–34.0)
MCHC: 32.9 g/dL (ref 32.0–36.0)
MCV: 91.2 fL (ref 80.0–100.0)
MONO ABS: 0.5 10*3/uL (ref 0.2–0.9)
Monocytes Relative: 10 %
Neutro Abs: 3.2 10*3/uL (ref 1.4–6.5)
Neutrophils Relative %: 55 %
Platelets: 274 10*3/uL (ref 150–440)
RBC: 3.69 MIL/uL — ABNORMAL LOW (ref 3.80–5.20)
RDW: 13.8 % (ref 11.5–14.5)
WBC: 5.7 10*3/uL (ref 3.6–11.0)

## 2014-07-21 LAB — COMPREHENSIVE METABOLIC PANEL
ALT: 20 U/L (ref 14–54)
AST: 29 U/L (ref 15–41)
Albumin: 3.8 g/dL (ref 3.5–5.0)
Alkaline Phosphatase: 70 U/L (ref 38–126)
Anion gap: 5 (ref 5–15)
BUN: 16 mg/dL (ref 6–20)
CO2: 25 mmol/L (ref 22–32)
Calcium: 8.3 mg/dL — ABNORMAL LOW (ref 8.9–10.3)
Chloride: 102 mmol/L (ref 101–111)
Creatinine, Ser: 0.92 mg/dL (ref 0.44–1.00)
GFR calc Af Amer: >60 mL/min (ref >60)
Glucose, Bld: 110 mg/dL — ABNORMAL HIGH (ref 65–99)
Potassium: 3.9 mmol/L (ref 3.5–5.1)
SODIUM: 132 mmol/L — AB (ref 135–145)
Total Bilirubin: 0.4 mg/dL (ref 0.3–1.2)
Total Protein: 8.1 g/dL (ref 6.5–8.1)

## 2014-07-27 ENCOUNTER — Inpatient Hospital Stay: Payer: PPO | Admitting: Oncology

## 2014-08-04 ENCOUNTER — Encounter: Payer: Self-pay | Admitting: Oncology

## 2014-08-04 ENCOUNTER — Inpatient Hospital Stay (HOSPITAL_BASED_OUTPATIENT_CLINIC_OR_DEPARTMENT_OTHER): Payer: PPO | Admitting: Oncology

## 2014-08-04 VITALS — BP 134/72 | HR 67 | Temp 97.2°F | Wt 131.8 lb

## 2014-08-04 DIAGNOSIS — K900 Celiac disease: Secondary | ICD-10-CM | POA: Insufficient documentation

## 2014-08-04 DIAGNOSIS — Z800 Family history of malignant neoplasm of digestive organs: Secondary | ICD-10-CM

## 2014-08-04 DIAGNOSIS — Z9221 Personal history of antineoplastic chemotherapy: Secondary | ICD-10-CM | POA: Diagnosis not present

## 2014-08-04 DIAGNOSIS — Z87891 Personal history of nicotine dependence: Secondary | ICD-10-CM

## 2014-08-04 DIAGNOSIS — Z85118 Personal history of other malignant neoplasm of bronchus and lung: Secondary | ICD-10-CM | POA: Diagnosis not present

## 2014-08-04 DIAGNOSIS — Z923 Personal history of irradiation: Secondary | ICD-10-CM

## 2014-08-04 DIAGNOSIS — I7090 Unspecified atherosclerosis: Secondary | ICD-10-CM

## 2014-08-04 DIAGNOSIS — C3411 Malignant neoplasm of upper lobe, right bronchus or lung: Secondary | ICD-10-CM

## 2014-08-04 DIAGNOSIS — Z79899 Other long term (current) drug therapy: Secondary | ICD-10-CM

## 2014-08-04 NOTE — Progress Notes (Signed)
[place] @ [facility] Telephone:(336) [phone]  Fax:(336) [place]: 24-Oct-1945  MR#: [id number]  AYT#:[id number]  Patient Care Team: Adin Hector, MD as PCP - General (Internal Medicine)  CHIEF COMPLAINT:  Chief Complaint  Patient presents with  . 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  She has finished radiation therapy is also being followed by Dr. Baruch Gouty with her last visit in his office on September 03, 2013.         Cancer of lung   08/04/2014 Initial Diagnosis Cancer of lung    69 year old lady with a history of carcinoma of lung adenocarcinoma.  T3 N0 M0 tumor stage II B status post resection chemotherapy and radiation therapy. History of slightly increased and light chain but SIEP showing poor recall gammopathy in November of 2014 INTERVAL HISTORY:   68 year old lady came today further follow-up regarding carcinoma of lung.  Had a CT scan.  No chest pain no hemoptysis no shortness of breath no bony pain.  Appetite has been stable.  Patient is getting regular mammogram and endoscopy done. REVIEW OF SYSTEMS:   GENERAL:  Feels good.  Active.  No fevers, sweats or weight loss. PERFORMANCE STATUS (ECOG)0 HEENT:  No visual changes, runny nose, sore throat, mouth sores or tenderness. Lungs: No shortness of breath or cough.  No hemoptysis. Cardiac:  No chest pain, palpitations, orthopnea, or PND. GI:  No nausea, vomiting, diarrhea, constipation, melena or hematochezia. GU:  No urgency, frequency, dysuria, or hematuria. Musculoskeletal:  No back pain.  No joint pain.  No muscle tenderness. Extremities:  No pain or swelling. Skin:  No rashes or skin changes. Neuro:  No headache, numbness or weakness,  balance or coordination issues. Endocrine:  No diabetes, thyroid issues, hot flashes or night sweats. Psych:  No mood changes, depression or anxiety. Pain:  No focal pain. Review of systems:  All other systems reviewed and found to be negative. As per HPI. Otherwise, a complete review of systems is negatve.  PAST MEDICAL HISTORY: Past Medical History  Diagnosis Date  . Cancer 05/2011    Right upper Lung CA with partial Lobectomy.    Significant History/PMH:   Lung cancer:    None: None             Smoking History: Former smoker  PFSH: Family History: Mother had colon cancer. Father died of COPD.  Social History: negative alcohol, negative tobacco  Comments: patient had previous history of smoking for 25 years or more but quit smoking in 1988.  Additional Past Medical and Surgical History: None.   ADVANCED DIRECTIVES:  Patient does have advance healthcare directive, Patient   does not desire to make any changes HEALTH MAINTENANCE: History  Substance Use Topics  . Smoking status: Former Research scientist (life sciences)  . Smokeless tobacco: Not on file  . Alcohol Use: Not on file      Allergies  Allergen Reactions  . Barley Grass Other (See Comments)    And rye Doesn't absorb in small intestines  . Wheat Bran Other (See Comments)    Doesn't absorb in small intestines    Current Outpatient Prescriptions  Medication Sig Dispense Refill  . alendronate (FOSAMAX) 70 MG tablet Take by mouth.    Marland Kitchen  calcium carbonate (OS-CAL) 600 MG TABS tablet Take by mouth.    . Cholecalciferol 1000 UNITS tablet Take by mouth.    . Multiple Vitamin (MULTI-VITAMINS) TABS Take by mouth.    . potassium chloride (K-DUR,KLOR-CON) 10 MEQ tablet Take by mouth.    . TOLAK 4 % CREA APPLY TO LEGS DAILY FOR 3 WEEKS BEFORE HAVING PDT LIGHT  0   No current facility-administered medications for this visit.    OBJECTIVE:  Filed Vitals:   08/04/14 1155  BP: 134/72  Pulse: 67  Temp: 97.2 F (36.2 C)     Body mass  index is 21.94 kg/(m^2).    ECOG FS:0 - Asymptomatic  PHYSICAL EXAM: GENERAL:  Well developed, well nourished, sitting comfortably in the exam room in no acute distress. MENTAL STATUS:  Alert and oriented to person, place and time. HEAD: .  Normocephalic, atraumatic, face symmetric, no Cushingoid features.  ENT:  Oropharynx clear without lesion.  Tongue normal. Mucous membranes moist.  RESPIRATORY:  Clear to auscultation without rales, wheezes or rhonchi. CARDIOVASCULAR:  Regular rate and rhythm without murmur, rub or gallop.  ABDOMEN:  Soft, non-tender, with active bowel sounds, and no hepatosplenomegaly.  No masses. BACK:  No CVA tenderness.  No tenderness on percussion of the back or rib cage. SKIN:  No rashes, ulcers or lesions. EXTREMITIES: No edema, no skin discoloration or tenderness.  No palpable cords. LYMPH NODES: No palpable cervical, supraclavicular, axillary or inguinal adenopathy  NEUROLOGICAL: Unremarkable. PSYCH:  Appropriate. CT scan of the chest (June, 2016 )  IMPRESSION: 1. Status post right upper lobectomy, without recurrent or metastatic disease. 2. Atherosclerosis, including within the coronary arteries.  LAB RESULTS:  No visits with results within 2 Day(s) from this visit. Latest known visit with results is:  Appointment on 07/21/2014  Component Date Value Ref Range Status  . WBC 07/21/2014 5.7  3.6 - 11.0 K/uL Final  . RBC 07/21/2014 3.69* 3.80 - 5.20 MIL/uL Final  . Hemoglobin 07/21/2014 11.1* 12.0 - 16.0 g/dL Final  . HCT 07/21/2014 33.6* 35.0 - 47.0 % Final  . MCV 07/21/2014 91.2  80.0 - 100.0 fL Final  . MCH 07/21/2014 30.0  26.0 - 34.0 pg Final  . MCHC 07/21/2014 32.9  32.0 - 36.0 g/dL Final  . RDW 07/21/2014 13.8  11.5 - 14.5 % Final  . Platelets 07/21/2014 274  150 - 440 K/uL Final  . Neutrophils Relative % 07/21/2014 55   Final  . Neutro Abs 07/21/2014 3.2  1.4 - 6.5 K/uL Final  . Lymphocytes Relative 07/21/2014 31   Final  . Lymphs Abs  07/21/2014 1.8  1.0 - 3.6 K/uL Final  . Monocytes Relative 07/21/2014 10   Final  . Monocytes Absolute 07/21/2014 0.5  0.2 - 0.9 K/uL Final  . Eosinophils Relative 07/21/2014 3   Final  . Eosinophils Absolute 07/21/2014 0.2  0 - 0.7 K/uL Final  . Basophils Relative 07/21/2014 1   Final  . Basophils Absolute 07/21/2014 0.1  0 - 0.1 K/uL Final  . Sodium 07/21/2014 132* 135 - 145 mmol/L Final  . Potassium 07/21/2014 3.9  3.5 - 5.1 mmol/L Final  . Chloride 07/21/2014 102  101 - 111 mmol/L Final  . CO2 07/21/2014 25  22 - 32 mmol/L Final  . Glucose, Bld 07/21/2014 110* 65 - 99 mg/dL Final  . BUN 07/21/2014 16  6 - 20 mg/dL Final  . Creatinine, Ser 07/21/2014 0.92  0.44 - 1.00 mg/dL Final  . Calcium 07/21/2014  8.3* 8.9 - 10.3 mg/dL Final  . Total Protein 07/21/2014 8.1  6.5 - 8.1 g/dL Final  . Albumin 07/21/2014 3.8  3.5 - 5.0 g/dL Final  . AST 07/21/2014 29  15 - 41 U/L Final  . ALT 07/21/2014 20  14 - 54 U/L Final  . Alkaline Phosphatase 07/21/2014 70  38 - 126 U/L Final  . Total Bilirubin 07/21/2014 0.4  0.3 - 1.2 mg/dL Final  . GFR calc non Af Amer 07/21/2014 >60  >60 mL/min Final  . GFR calc Af Amer 07/21/2014 >60  >60 mL/min Final   Comment: (NOTE) The eGFR has been calculated using the CKD EPI equation. This calculation has not been validated in all clinical situations. eGFR's persistently <60 mL/min signify possible Chronic Kidney Disease.   . Anion gap 07/21/2014 5  5 - 15 Final       ASSESSMENT: Cancer of lung status post resection chemotherapy and radiation therapy on an adjuvant basis.  There is no evidence of recurrent disease on clinical examination.  MEDICAL DECISION MAKING:  Lab data has been reviewed.  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 She also understands that She can call clinic at any time with any questions, concerns, or complaints.    No matching staging information was found for the patient.  Forest Gleason, MD   08/04/2014 12:14 PM

## 2014-08-04 NOTE — Progress Notes (Signed)
Patient does not have living will.  Former smoker.

## 2014-09-02 ENCOUNTER — Ambulatory Visit: Payer: PPO | Attending: Radiation Oncology | Admitting: Radiation Oncology

## 2015-02-04 ENCOUNTER — Inpatient Hospital Stay: Payer: PPO | Attending: Oncology

## 2015-02-04 DIAGNOSIS — Z79899 Other long term (current) drug therapy: Secondary | ICD-10-CM | POA: Insufficient documentation

## 2015-02-04 DIAGNOSIS — R0602 Shortness of breath: Secondary | ICD-10-CM | POA: Insufficient documentation

## 2015-02-04 DIAGNOSIS — Z85118 Personal history of other malignant neoplasm of bronchus and lung: Secondary | ICD-10-CM | POA: Insufficient documentation

## 2015-02-04 DIAGNOSIS — Z9221 Personal history of antineoplastic chemotherapy: Secondary | ICD-10-CM | POA: Diagnosis not present

## 2015-02-04 DIAGNOSIS — Z923 Personal history of irradiation: Secondary | ICD-10-CM | POA: Insufficient documentation

## 2015-02-04 DIAGNOSIS — Z87891 Personal history of nicotine dependence: Secondary | ICD-10-CM | POA: Insufficient documentation

## 2015-02-04 DIAGNOSIS — C3411 Malignant neoplasm of upper lobe, right bronchus or lung: Secondary | ICD-10-CM

## 2015-02-04 LAB — CBC WITH DIFFERENTIAL/PLATELET
BASOS ABS: 0.1 10*3/uL (ref 0–0.1)
Basophils Relative: 2 %
EOS PCT: 3 %
Eosinophils Absolute: 0.1 10*3/uL (ref 0–0.7)
HCT: 34.9 % — ABNORMAL LOW (ref 35.0–47.0)
HEMOGLOBIN: 11.7 g/dL — AB (ref 12.0–16.0)
LYMPHS PCT: 31 %
Lymphs Abs: 1.4 10*3/uL (ref 1.0–3.6)
MCH: 30.5 pg (ref 26.0–34.0)
MCHC: 33.4 g/dL (ref 32.0–36.0)
MCV: 91.4 fL (ref 80.0–100.0)
Monocytes Absolute: 0.4 10*3/uL (ref 0.2–0.9)
Monocytes Relative: 10 %
NEUTROS ABS: 2.4 10*3/uL (ref 1.4–6.5)
NEUTROS PCT: 54 %
PLATELETS: 223 10*3/uL (ref 150–440)
RBC: 3.82 MIL/uL (ref 3.80–5.20)
RDW: 14.2 % (ref 11.5–14.5)
WBC: 4.4 10*3/uL (ref 3.6–11.0)

## 2015-02-04 LAB — COMPREHENSIVE METABOLIC PANEL
ALT: 15 U/L (ref 14–54)
AST: 22 U/L (ref 15–41)
Albumin: 3.9 g/dL (ref 3.5–5.0)
Alkaline Phosphatase: 71 U/L (ref 38–126)
Anion gap: 5 (ref 5–15)
BUN: 25 mg/dL — ABNORMAL HIGH (ref 6–20)
CHLORIDE: 105 mmol/L (ref 101–111)
CO2: 25 mmol/L (ref 22–32)
CREATININE: 0.87 mg/dL (ref 0.44–1.00)
Calcium: 9 mg/dL (ref 8.9–10.3)
GFR calc Af Amer: >60 mL/min (ref >60)
GFR calc non Af Amer: >60 mL/min (ref >60)
Glucose, Bld: 92 mg/dL (ref 65–99)
Potassium: 3.8 mmol/L (ref 3.5–5.1)
SODIUM: 135 mmol/L (ref 135–145)
Total Bilirubin: 0.5 mg/dL (ref 0.3–1.2)
Total Protein: 8.2 g/dL — ABNORMAL HIGH (ref 6.5–8.1)

## 2015-02-05 LAB — PROTEIN ELECTROPHORESIS, SERUM
A/G Ratio: 0.9 (ref 0.7–1.7)
ALPHA-1-GLOBULIN: 0.2 g/dL (ref 0.0–0.4)
Albumin ELP: 3.5 g/dL (ref 2.9–4.4)
Alpha-2-Globulin: 0.7 g/dL (ref 0.4–1.0)
Beta Globulin: 1 g/dL (ref 0.7–1.3)
GAMMA GLOBULIN: 1.9 g/dL — AB (ref 0.4–1.8)
Globulin, Total: 3.9 g/dL (ref 2.2–3.9)
TOTAL PROTEIN ELP: 7.4 g/dL (ref 6.0–8.5)

## 2015-02-05 LAB — KAPPA/LAMBDA LIGHT CHAINS
Kappa free light chain: 36.9 mg/L — ABNORMAL HIGH (ref 3.30–19.40)
Kappa, lambda light chain ratio: 1.64 (ref 0.26–1.65)
Lambda free light chains: 22.45 mg/L (ref 5.71–26.30)

## 2015-02-11 ENCOUNTER — Other Ambulatory Visit: Payer: Self-pay | Admitting: Internal Medicine

## 2015-02-11 ENCOUNTER — Inpatient Hospital Stay (HOSPITAL_BASED_OUTPATIENT_CLINIC_OR_DEPARTMENT_OTHER): Payer: PPO | Admitting: Oncology

## 2015-02-11 VITALS — BP 138/74 | HR 18 | Temp 94.6°F | Resp 18 | Wt 137.3 lb

## 2015-02-11 DIAGNOSIS — R0602 Shortness of breath: Secondary | ICD-10-CM | POA: Diagnosis not present

## 2015-02-11 DIAGNOSIS — C3411 Malignant neoplasm of upper lobe, right bronchus or lung: Secondary | ICD-10-CM

## 2015-02-11 DIAGNOSIS — Z85118 Personal history of other malignant neoplasm of bronchus and lung: Secondary | ICD-10-CM | POA: Diagnosis not present

## 2015-02-11 DIAGNOSIS — Z79899 Other long term (current) drug therapy: Secondary | ICD-10-CM

## 2015-02-11 DIAGNOSIS — Z9221 Personal history of antineoplastic chemotherapy: Secondary | ICD-10-CM | POA: Diagnosis not present

## 2015-02-11 DIAGNOSIS — Z1231 Encounter for screening mammogram for malignant neoplasm of breast: Secondary | ICD-10-CM

## 2015-02-11 DIAGNOSIS — Z87891 Personal history of nicotine dependence: Secondary | ICD-10-CM

## 2015-02-11 DIAGNOSIS — Z923 Personal history of irradiation: Secondary | ICD-10-CM | POA: Diagnosis not present

## 2015-02-11 NOTE — Progress Notes (Signed)
[place]  Telephone:(336) [phone]  Fax:(336) [place] DOB: 24-Aug-1945  MR#: [id number]  UMP#:[id number]  Patient Care Team: Adin Hector, MD as PCP - General (Internal Medicine)  CHIEF COMPLAINT:  Chief Complaint  Patient presents with  . Lung Cancer    INTERVAL HISTORY:  Patient is here for further follow-up regarding carcinoma of lung. She is status post right upper lobectomy and portion of right lower lobe resection for stage IIb, T3 N0 M0,as well as adjuvant chemotherapy, and radiation therapy.she reports overall feeling very well. Denies any complaints of acute shortness of breath, hemoptysis, chest pain. Patient also with a history of slightly increased serum light chains. She does report having some intermittent shortness of breath with exertional activities such as exercise.  REVIEW OF SYSTEMS:   Review of Systems  Constitutional: Negative for fever, chills, weight loss, malaise/fatigue and diaphoresis.  HENT: Negative.   Eyes: Negative.   Respiratory: Negative for cough, hemoptysis, sputum production, shortness of breath and wheezing.   Cardiovascular: Negative for chest pain, palpitations, orthopnea, claudication, leg swelling and PND.  Gastrointestinal: Negative for heartburn, nausea, vomiting, abdominal pain, diarrhea, constipation, blood in stool and melena.  Genitourinary: Negative.   Musculoskeletal: Negative.   Skin: Negative.   Neurological: Negative for dizziness, tingling, focal weakness, seizures and weakness.  Endo/Heme/Allergies: Does not bruise/bleed easily.  Psychiatric/Behavioral: Negative for depression. The patient is not nervous/anxious and does not have insomnia.     As per HPI. Otherwise, a complete review of systems is negatve.  ONCOLOGY HISTORY: Oncology History   Chief Complaint/Diagnosis:   70 year old female status post right upper lobe lobectomy and portion of right lower lobe for IIb (T3, N0, M0)  invasive adenocarcinoma with parietal pleural involvement and close margin at .3 mm status post adjuvant chemotherapy.  She has finished radiation therapy is also being followed by Dr. Baruch Gouty with her last visit in his office on September 03, 2013.         Cancer of lung ([place])   08/04/2014 Initial Diagnosis Cancer of lung    PAST MEDICAL HISTORY: Past Medical History  Diagnosis Date  . Cancer 05/2011    Right upper Lung CA with partial Lobectomy.    PAST SURGICAL HISTORY: No past surgical history on file.  FAMILY HISTORY No family history on file.  GYNECOLOGIC HISTORY:  No LMP recorded. Patient is postmenopausal.     ADVANCED DIRECTIVES:    HEALTH MAINTENANCE: Social History  Substance Use Topics  . Smoking status: Former Research scientist (life sciences)  . Smokeless tobacco: Not on file  . Alcohol Use: Not on file     Allergies  Allergen Reactions  . Barley Grass Other (See Comments)    And rye Doesn't absorb in small intestines  . Wheat Bran Other (See Comments)    Doesn't absorb in small intestines    Current Outpatient Prescriptions  Medication Sig Dispense Refill  . calcium carbonate (OS-CAL) 600 MG TABS tablet Take by mouth.    . Cholecalciferol 1000 UNITS tablet Take by mouth.    . Multiple Vitamin (MULTI-VITAMINS) TABS Take by mouth.     No current facility-administered medications for this visit.    OBJECTIVE: BP 138/74 mmHg  Pulse 18  Temp(Src) 94.6 F (34.8 C) (Tympanic)  Resp 18  Wt 137 lb 5.6 oz (62.3 kg)   Body mass index is 22.86 kg/(m^2).    ECOG FS:0 - Asymptomatic  General: Well-developed, well-nourished, no acute distress. Eyes: Pink  conjunctiva, anicteric sclera. HEENT: Normocephalic, moist mucous membranes, clear oropharnyx. Lungs: Clear to auscultation bilaterally. Heart: Regular rate and rhythm. No rubs, murmurs, or gallops. Abdomen: Soft, nontender, nondistended. No organomegaly noted, normoactive bowel sounds. Musculoskeletal: No edema, cyanosis, or  clubbing. Neuro: Alert, answering all questions appropriately. Cranial nerves grossly intact. Skin: No rashes or petechiae noted. Psych: Normal affect. Lymphatics: No cervical, clavicular LAD.   LAB RESULTS:  No visits with results within 3 Day(s) from this visit. Latest known visit with results is:  Appointment on 02/04/2015  Component Date Value Ref Range Status  . WBC 02/04/2015 4.4  3.6 - 11.0 K/uL Final  . RBC 02/04/2015 3.82  3.80 - 5.20 MIL/uL Final  . Hemoglobin 02/04/2015 11.7* 12.0 - 16.0 g/dL Final  . HCT 02/04/2015 34.9* 35.0 - 47.0 % Final  . MCV 02/04/2015 91.4  80.0 - 100.0 fL Final  . MCH 02/04/2015 30.5  26.0 - 34.0 pg Final  . MCHC 02/04/2015 33.4  32.0 - 36.0 g/dL Final  . RDW 02/04/2015 14.2  11.5 - 14.5 % Final  . Platelets 02/04/2015 223  150 - 440 K/uL Final  . Neutrophils Relative % 02/04/2015 54   Final  . Neutro Abs 02/04/2015 2.4  1.4 - 6.5 K/uL Final  . Lymphocytes Relative 02/04/2015 31   Final  . Lymphs Abs 02/04/2015 1.4  1.0 - 3.6 K/uL Final  . Monocytes Relative 02/04/2015 10   Final  . Monocytes Absolute 02/04/2015 0.4  0.2 - 0.9 K/uL Final  . Eosinophils Relative 02/04/2015 3   Final  . Eosinophils Absolute 02/04/2015 0.1  0 - 0.7 K/uL Final  . Basophils Relative 02/04/2015 2   Final  . Basophils Absolute 02/04/2015 0.1  0 - 0.1 K/uL Final  . Sodium 02/04/2015 135  135 - 145 mmol/L Final  . Potassium 02/04/2015 3.8  3.5 - 5.1 mmol/L Final  . Chloride 02/04/2015 105  101 - 111 mmol/L Final  . CO2 02/04/2015 25  22 - 32 mmol/L Final  . Glucose, Bld 02/04/2015 92  65 - 99 mg/dL Final  . BUN 02/04/2015 25* 6 - 20 mg/dL Final  . Creatinine, Ser 02/04/2015 0.87  0.44 - 1.00 mg/dL Final  . Calcium 02/04/2015 9.0  8.9 - 10.3 mg/dL Final  . Total Protein 02/04/2015 8.2* 6.5 - 8.1 g/dL Final  . Albumin 02/04/2015 3.9  3.5 - 5.0 g/dL Final  . AST 02/04/2015 22  15 - 41 U/L Final  . ALT 02/04/2015 15  14 - 54 U/L Final  . Alkaline Phosphatase  02/04/2015 71  38 - 126 U/L Final  . Total Bilirubin 02/04/2015 0.5  0.3 - 1.2 mg/dL Final  . GFR calc non Af Amer 02/04/2015 >60  >60 mL/min Final  . GFR calc Af Amer 02/04/2015 >60  >60 mL/min Final   Comment: (NOTE) The eGFR has been calculated using the CKD EPI equation. This calculation has not been validated in all clinical situations. eGFR's persistently <60 mL/min signify possible Chronic Kidney Disease.   . Anion gap 02/04/2015 5  5 - 15 Final  . Kappa free light chain 02/04/2015 36.90* 3.30 - 19.40 mg/L Final  . Lamda free light chains 02/04/2015 22.45  5.71 - 26.30 mg/L Final  . Kappa, lamda light chain ratio 02/04/2015 1.64  0.26 - 1.65 Final   Comment: (NOTE) Performed At: [facility] [place], [place] [id number] Lindon Romp MD [id number]   . Total Protein ELP 02/04/2015 7.4  6.0 -  8.5 g/dL Final  . Albumin ELP 02/04/2015 3.5  2.9 - 4.4 g/dL Final  . Alpha-1-Globulin 02/04/2015 0.2  0.0 - 0.4 g/dL Final  . Alpha-2-Globulin 02/04/2015 0.7  0.4 - 1.0 g/dL Final  . Beta Globulin 02/04/2015 1.0  0.7 - 1.3 g/dL Final  . Gamma Globulin 02/04/2015 1.9* 0.4 - 1.8 g/dL Final  . M-Spike, % 02/04/2015 Not Observed  Not Observed g/dL Final  . SPE Interp. 02/04/2015 Comment   Final   Comment: (NOTE) The SPE pattern reflects a polyclonal increase in gamma globulin due to numerous clones of plasma cells producing heterogeneous antibody in response to some form of antigenic stimulus.  Hypergammaglob- ulinemia is found in a wide variety of infectious, non-infectious, and autoimmune disease states. Evidence of monoclonal protein is not apparent. Performed At: [facility] [place], [place] [id number] Lindon Romp MD QN:[id number]   . Comment 02/04/2015 Comment   Final   Comment: (NOTE) Protein electrophoresis scan will follow via computer, mail, or courier delivery.   Marland Kitchen GLOBULIN, TOTAL 02/04/2015 3.9  2.2 - 3.9 g/dL  Corrected  . A/G Ratio 02/04/2015 0.9  0.7 - 1.7 Corrected    STUDIES: No results found.  ASSESSMENT:  Adenocarcinoma of lung, stage IIB, T3 N0 M0.  PLAN:   1. Lung cancer. Patient is status post right upper lobectomy as well as a right lower lobe resection, adjuvant chemotherapy, as well as radiation therapy. Her most recent CT scan was in June 2016 and showed no evidence of any recurrent or progressive disease. She overall is feeling very well. Interested in increasing her exercise, discussed care program with patient and will make referral. We will schedule for routine follow-up again in 6 months. We'll also schedule patient to have a CT scan of the chest prior to next appointment for reevaluation. 2. Slightly elevated light chain.labs have been reviewed and are stable. No M spike seen on SPEP.  Patient expressed understanding and was in agreement with this plan. She also understands that She can call clinic at any time with any questions, concerns, or complaints.   Dr. Oliva Bustard was available for consultation and review of plan of care for this patient.   Evlyn Kanner, NP   02/11/2015 11:44 AM

## 2015-02-18 ENCOUNTER — Ambulatory Visit: Payer: PPO

## 2015-02-25 ENCOUNTER — Other Ambulatory Visit: Payer: Self-pay | Admitting: Internal Medicine

## 2015-02-25 ENCOUNTER — Ambulatory Visit
Admission: RE | Admit: 2015-02-25 | Discharge: 2015-02-25 | Disposition: A | Discharge status: Home / Self Care | Payer: PPO | Source: Ambulatory Visit | Attending: Internal Medicine | Admitting: Internal Medicine

## 2015-02-25 DIAGNOSIS — Z1231 Encounter for screening mammogram for malignant neoplasm of breast: Secondary | ICD-10-CM

## 2015-05-03 DIAGNOSIS — C44729 Squamous cell carcinoma of skin of left lower limb, including hip: Secondary | ICD-10-CM | POA: Diagnosis not present

## 2015-05-03 DIAGNOSIS — L281 Prurigo nodularis: Secondary | ICD-10-CM | POA: Diagnosis not present

## 2015-05-03 DIAGNOSIS — D485 Neoplasm of uncertain behavior of skin: Secondary | ICD-10-CM | POA: Diagnosis not present

## 2015-05-25 DIAGNOSIS — D649 Anemia, unspecified: Secondary | ICD-10-CM | POA: Diagnosis not present

## 2015-05-25 DIAGNOSIS — E785 Hyperlipidemia, unspecified: Secondary | ICD-10-CM | POA: Diagnosis not present

## 2015-05-25 DIAGNOSIS — M810 Age-related osteoporosis without current pathological fracture: Secondary | ICD-10-CM | POA: Diagnosis not present

## 2015-06-01 DIAGNOSIS — L989 Disorder of the skin and subcutaneous tissue, unspecified: Secondary | ICD-10-CM | POA: Diagnosis not present

## 2015-06-01 DIAGNOSIS — E784 Other hyperlipidemia: Secondary | ICD-10-CM | POA: Diagnosis not present

## 2015-06-01 DIAGNOSIS — D6489 Other specified anemias: Secondary | ICD-10-CM | POA: Diagnosis not present

## 2015-06-01 DIAGNOSIS — K900 Celiac disease: Secondary | ICD-10-CM | POA: Diagnosis not present

## 2015-06-01 DIAGNOSIS — M810 Age-related osteoporosis without current pathological fracture: Secondary | ICD-10-CM | POA: Diagnosis not present

## 2015-06-01 DIAGNOSIS — E559 Vitamin D deficiency, unspecified: Secondary | ICD-10-CM | POA: Diagnosis not present

## 2015-06-01 DIAGNOSIS — Z85118 Personal history of other malignant neoplasm of bronchus and lung: Secondary | ICD-10-CM | POA: Diagnosis not present

## 2015-06-15 DIAGNOSIS — C44729 Squamous cell carcinoma of skin of left lower limb, including hip: Secondary | ICD-10-CM | POA: Diagnosis not present

## 2015-07-26 DIAGNOSIS — C44729 Squamous cell carcinoma of skin of left lower limb, including hip: Secondary | ICD-10-CM | POA: Diagnosis not present

## 2015-07-26 DIAGNOSIS — L80 Vitiligo: Secondary | ICD-10-CM | POA: Diagnosis not present

## 2015-08-09 ENCOUNTER — Ambulatory Visit
Admission: RE | Admit: 2015-08-09 | Discharge: 2015-08-09 | Disposition: A | Discharge status: Home / Self Care | Payer: PPO | Source: Ambulatory Visit | Attending: Family Medicine | Admitting: Family Medicine

## 2015-08-09 DIAGNOSIS — C3411 Malignant neoplasm of upper lobe, right bronchus or lung: Secondary | ICD-10-CM | POA: Diagnosis not present

## 2015-08-09 DIAGNOSIS — I700 Atherosclerosis of aorta: Secondary | ICD-10-CM | POA: Insufficient documentation

## 2015-08-09 LAB — POCT I-STAT CREATININE: CREATININE: 0.9 mg/dL (ref 0.44–1.00)

## 2015-08-09 MED ORDER — IOPAMIDOL (ISOVUE-300) INJECTION 61%
75.0000 mL | Freq: Once | INTRAVENOUS | Status: AC | PRN
  Administered 2015-08-09: 75 mL via INTRAVENOUS

## 2015-08-11 ENCOUNTER — Ambulatory Visit: Payer: PPO | Admitting: Oncology

## 2015-08-11 ENCOUNTER — Other Ambulatory Visit: Payer: PPO

## 2015-08-12 ENCOUNTER — Other Ambulatory Visit: Payer: PPO

## 2015-08-12 ENCOUNTER — Ambulatory Visit: Payer: PPO | Admitting: Oncology

## 2015-08-12 ENCOUNTER — Ambulatory Visit: Payer: PPO | Admitting: Hematology and Oncology

## 2015-08-16 NOTE — Progress Notes (Signed)
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  INTERVAL HISTORY: Patient returns to clinic today for routine follow-up and discussion of her imaging results. She continues to feel well and remains asymptomatic. She has no neurologic complaints. She denies any recent fevers or illnesses. She has a good appetite and denies weight loss. She denies any chest pain, shortness of breath, cough, or hemoptysis. She denies any nausea, vomiting, constipation, or diarrhea. She has no urinary complaints. Patient feels at her baseline and offers no specific complaints today.  REVIEW OF SYSTEMS:   Review of Systems  Constitutional: Negative.  Negative for fever, malaise/fatigue and weight loss.  Respiratory: Negative.  Negative for cough, hemoptysis and shortness of breath.   Cardiovascular: Negative.  Negative for chest pain.  Gastrointestinal: Negative.  Negative for abdominal pain.  Genitourinary: Negative.   Musculoskeletal: Negative.   Neurological: Negative.  Negative for weakness.  Psychiatric/Behavioral: Negative.  The patient is not nervous/anxious.     As per HPI. Otherwise, a complete review of systems is negatve.   PAST MEDICAL HISTORY: Past Medical History:  Diagnosis Date  . Cancer [facility]) 05/2011   Right upper Lung CA with partial Lobectomy.    PAST SURGICAL HISTORY: No past surgical history on file.  FAMILY HISTORY Family History  Problem Relation Age of Onset  . Breast cancer Cousin 50    GYNECOLOGIC HISTORY:  No LMP recorded. Patient is postmenopausal.     ADVANCED DIRECTIVES:    HEALTH MAINTENANCE: Social History  Substance Use Topics  . Smoking status: Former Smoker    Types: Cigarettes    Quit date: 02/28/1986  .  Smokeless tobacco: Never Used  . Alcohol use No     Allergies  Allergen Reactions  . Barley Grass Other (See Comments)    And rye Doesn't absorb in small intestines  . Wheat Bran Other (See Comments)    Doesn't absorb in small intestines    Current Outpatient Prescriptions  Medication Sig Dispense Refill  . calcium carbonate (OS-CAL) 600 MG TABS tablet Take by mouth.    . Cholecalciferol 1000 UNITS tablet Take 2,000 Units by mouth.     . Multiple Vitamin (MULTI-VITAMINS) TABS Take by mouth.    . clobetasol cream (TEMOVATE) 0.05 %      No current facility-administered medications for this visit.     OBJECTIVE: BP 132/78 (BP Location: Left Arm, Patient Position: Sitting)   Pulse 78   Temp 97 F (36.1 C) (Tympanic)   Resp 17   Ht 5' 5.5" (1.664 m)   Wt 137 lb 14.4 oz (62.5 kg)   SpO2 97%   BMI 22.60 kg/m    Body mass index is 22.6 kg/m.    ECOG FS:0 - Asymptomatic  General: Well-developed, well-nourished, no acute distress. Eyes: Pink conjunctiva, anicteric sclera. Lungs: Clear to auscultation bilaterally. Heart: Regular rate and rhythm. No rubs, murmurs, or gallops. Abdomen: Soft, nontender, nondistended. No organomegaly noted, normoactive bowel sounds. Musculoskeletal: No edema, cyanosis, or clubbing. Neuro: Alert, answering all questions appropriately. Cranial nerves grossly intact. Skin: No rashes or petechiae noted. Psych: Normal affect.   LAB RESULTS:  Appointment on 08/17/2015  Component Date Value Ref Range Status  . WBC 08/17/2015 7.3  3.6 - 11.0 K/uL Final  .  RBC 08/17/2015 3.82  3.80 - 5.20 MIL/uL Final  . Hemoglobin 08/17/2015 12.0  12.0 - 16.0 g/dL Final  . HCT 08/17/2015 34.9* 35.0 - 47.0 % Final  . MCV 08/17/2015 91.3  80.0 - 100.0 fL Final  . MCH 08/17/2015 31.4  26.0 - 34.0 pg Final  . MCHC 08/17/2015 34.4  32.0 - 36.0 g/dL Final  . RDW 08/17/2015 13.9  11.5 - 14.5 % Final  . Platelets 08/17/2015 233  150 - 440 K/uL Final  . Neutrophils  Relative % 08/17/2015 67  % Final  . Neutro Abs 08/17/2015 4.9  1.4 - 6.5 K/uL Final  . Lymphocytes Relative 08/17/2015 24  % Final  . Lymphs Abs 08/17/2015 1.8  1.0 - 3.6 K/uL Final  . Monocytes Relative 08/17/2015 6  % Final  . Monocytes Absolute 08/17/2015 0.4  0.2 - 0.9 K/uL Final  . Eosinophils Relative 08/17/2015 2  % Final  . Eosinophils Absolute 08/17/2015 0.1  0 - 0.7 K/uL Final  . Basophils Relative 08/17/2015 1  % Final  . Basophils Absolute 08/17/2015 0.1  0 - 0.1 K/uL Final  . Sodium 08/17/2015 135  135 - 145 mmol/L Final  . Potassium 08/17/2015 3.5  3.5 - 5.1 mmol/L Final  . Chloride 08/17/2015 104  101 - 111 mmol/L Final  . CO2 08/17/2015 24  22 - 32 mmol/L Final  . Glucose, Bld 08/17/2015 124* 65 - 99 mg/dL Final  . BUN 08/17/2015 13  6 - 20 mg/dL Final  . Creatinine, Ser 08/17/2015 0.94  0.44 - 1.00 mg/dL Final  . Calcium 08/17/2015 9.4  8.9 - 10.3 mg/dL Final  . Total Protein 08/17/2015 8.5* 6.5 - 8.1 g/dL Final  . Albumin 08/17/2015 4.2  3.5 - 5.0 g/dL Final  . AST 08/17/2015 27  15 - 41 U/L Final  . ALT 08/17/2015 15  14 - 54 U/L Final  . Alkaline Phosphatase 08/17/2015 76  38 - 126 U/L Final  . Total Bilirubin 08/17/2015 0.7  0.3 - 1.2 mg/dL Final  . GFR calc non Af Amer 08/17/2015 59* >60 mL/min Final  . GFR calc Af Amer 08/17/2015 >60  >60 mL/min Final   Comment: (NOTE) The eGFR has been calculated using the CKD EPI equation. This calculation has not been validated in all clinical situations. eGFR's persistently <60 mL/min signify possible Chronic Kidney Disease.   . Anion gap 08/17/2015 7  5 - 15 Final    STUDIES: No results found.  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  PLAN:    1. 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 She also completed adjuvant chemotherapy and XRT. Her most recent , adjuvant  chemotherapy, as well as radiation therapy. Her most recent CT scan on August 09, 2015 reported no new or progressive disease. No intervention is needed at this time. Return to clinic in 1 year for repeat imaging and further evaluation.  2. Elevated protein: Previously, patient did not have an M spike but was noted to have a mildly elevated Free light chain.   Patient expressed understanding and was in agreement with this plan. She also understands that She can call clinic at any time with any questions, concerns, or complaints.   Lloyd Huger, MD   08/18/2015 11:35 PM

## 2015-08-17 ENCOUNTER — Ambulatory Visit: Payer: PPO | Admitting: Oncology

## 2015-08-17 ENCOUNTER — Inpatient Hospital Stay: Payer: PPO

## 2015-08-17 ENCOUNTER — Other Ambulatory Visit: Payer: PPO

## 2015-08-17 ENCOUNTER — Other Ambulatory Visit: Payer: Self-pay

## 2015-08-17 ENCOUNTER — Encounter (INDEPENDENT_AMBULATORY_CARE_PROVIDER_SITE_OTHER): Payer: Self-pay

## 2015-08-17 ENCOUNTER — Inpatient Hospital Stay: Payer: PPO | Attending: Oncology | Admitting: Oncology

## 2015-08-17 ENCOUNTER — Encounter: Payer: Self-pay | Admitting: Oncology

## 2015-08-17 VITALS — BP 132/78 | HR 78 | Temp 97.0°F | Resp 17 | Ht 65.5 in | Wt 137.9 lb

## 2015-08-17 DIAGNOSIS — Z9221 Personal history of antineoplastic chemotherapy: Secondary | ICD-10-CM | POA: Diagnosis not present

## 2015-08-17 DIAGNOSIS — Z923 Personal history of irradiation: Secondary | ICD-10-CM | POA: Insufficient documentation

## 2015-08-17 DIAGNOSIS — C3411 Malignant neoplasm of upper lobe, right bronchus or lung: Secondary | ICD-10-CM

## 2015-08-17 DIAGNOSIS — R7989 Other specified abnormal findings of blood chemistry: Secondary | ICD-10-CM | POA: Diagnosis not present

## 2015-08-17 DIAGNOSIS — Z79899 Other long term (current) drug therapy: Secondary | ICD-10-CM | POA: Insufficient documentation

## 2015-08-17 DIAGNOSIS — Z87891 Personal history of nicotine dependence: Secondary | ICD-10-CM

## 2015-08-17 DIAGNOSIS — Z803 Family history of malignant neoplasm of breast: Secondary | ICD-10-CM | POA: Diagnosis not present

## 2015-08-17 LAB — CBC WITH DIFFERENTIAL/PLATELET
Basophils Absolute: 0.1 10*3/uL (ref 0–0.1)
Basophils Relative: 1 %
Eosinophils Absolute: 0.1 10*3/uL (ref 0–0.7)
Eosinophils Relative: 2 %
HEMATOCRIT: 34.9 % — AB (ref 35.0–47.0)
HEMOGLOBIN: 12 g/dL (ref 12.0–16.0)
LYMPHS ABS: 1.8 10*3/uL (ref 1.0–3.6)
Lymphocytes Relative: 24 %
MCH: 31.4 pg (ref 26.0–34.0)
MCHC: 34.4 g/dL (ref 32.0–36.0)
MCV: 91.3 fL (ref 80.0–100.0)
MONO ABS: 0.4 10*3/uL (ref 0.2–0.9)
MONOS PCT: 6 %
NEUTROS ABS: 4.9 10*3/uL (ref 1.4–6.5)
NEUTROS PCT: 67 %
Platelets: 233 10*3/uL (ref 150–440)
RBC: 3.82 MIL/uL (ref 3.80–5.20)
RDW: 13.9 % (ref 11.5–14.5)
WBC: 7.3 10*3/uL (ref 3.6–11.0)

## 2015-08-17 LAB — COMPREHENSIVE METABOLIC PANEL
ALK PHOS: 76 U/L (ref 38–126)
ALT: 15 U/L (ref 14–54)
ANION GAP: 7 (ref 5–15)
AST: 27 U/L (ref 15–41)
Albumin: 4.2 g/dL (ref 3.5–5.0)
BILIRUBIN TOTAL: 0.7 mg/dL (ref 0.3–1.2)
BUN: 13 mg/dL (ref 6–20)
CALCIUM: 9.4 mg/dL (ref 8.9–10.3)
CO2: 24 mmol/L (ref 22–32)
Chloride: 104 mmol/L (ref 101–111)
Creatinine, Ser: 0.94 mg/dL (ref 0.44–1.00)
GFR, EST NON AFRICAN AMERICAN: 59 mL/min — AB (ref >60)
GLUCOSE: 124 mg/dL — AB (ref 65–99)
POTASSIUM: 3.5 mmol/L (ref 3.5–5.1)
Sodium: 135 mmol/L (ref 135–145)
TOTAL PROTEIN: 8.5 g/dL — AB (ref 6.5–8.1)

## 2015-08-17 NOTE — Progress Notes (Signed)
No changes since last visit that pt

## 2015-09-28 DIAGNOSIS — H2513 Age-related nuclear cataract, bilateral: Secondary | ICD-10-CM | POA: Diagnosis not present

## 2015-10-11 DIAGNOSIS — Z85828 Personal history of other malignant neoplasm of skin: Secondary | ICD-10-CM | POA: Diagnosis not present

## 2015-10-11 DIAGNOSIS — Z08 Encounter for follow-up examination after completed treatment for malignant neoplasm: Secondary | ICD-10-CM | POA: Diagnosis not present

## 2015-10-11 DIAGNOSIS — X32XXXA Exposure to sunlight, initial encounter: Secondary | ICD-10-CM | POA: Diagnosis not present

## 2015-10-11 DIAGNOSIS — L853 Xerosis cutis: Secondary | ICD-10-CM | POA: Diagnosis not present

## 2015-10-11 DIAGNOSIS — L570 Actinic keratosis: Secondary | ICD-10-CM | POA: Diagnosis not present

## 2015-10-19 DIAGNOSIS — Z23 Encounter for immunization: Secondary | ICD-10-CM | POA: Diagnosis not present

## 2015-11-23 DIAGNOSIS — Z0000 Encounter for general adult medical examination without abnormal findings: Secondary | ICD-10-CM | POA: Diagnosis not present

## 2016-04-18 ENCOUNTER — Encounter: Payer: Self-pay | Admitting: Emergency Medicine

## 2016-04-18 ENCOUNTER — Observation Stay
Admission: EM | Admit: 2016-04-18 | Discharge: 2016-04-19 | Disposition: A | Discharge status: Skilled Nursing Facility | Payer: PPO | Attending: Internal Medicine | Admitting: Internal Medicine

## 2016-04-18 ENCOUNTER — Emergency Department: Payer: PPO

## 2016-04-18 DIAGNOSIS — W109XXA Fall (on) (from) unspecified stairs and steps, initial encounter: Secondary | ICD-10-CM | POA: Diagnosis not present

## 2016-04-18 DIAGNOSIS — G8911 Acute pain due to trauma: Secondary | ICD-10-CM

## 2016-04-18 DIAGNOSIS — Z902 Acquired absence of lung [part of]: Secondary | ICD-10-CM | POA: Diagnosis not present

## 2016-04-18 DIAGNOSIS — M810 Age-related osteoporosis without current pathological fracture: Secondary | ICD-10-CM | POA: Insufficient documentation

## 2016-04-18 DIAGNOSIS — S32512A Fracture of superior rim of left pubis, initial encounter for closed fracture: Secondary | ICD-10-CM | POA: Diagnosis not present

## 2016-04-18 DIAGNOSIS — S329XXA Fracture of unspecified parts of lumbosacral spine and pelvis, initial encounter for closed fracture: Secondary | ICD-10-CM | POA: Diagnosis present

## 2016-04-18 DIAGNOSIS — Z91018 Allergy to other foods: Secondary | ICD-10-CM | POA: Insufficient documentation

## 2016-04-18 DIAGNOSIS — K900 Celiac disease: Secondary | ICD-10-CM | POA: Diagnosis not present

## 2016-04-18 DIAGNOSIS — Y92009 Unspecified place in unspecified non-institutional (private) residence as the place of occurrence of the external cause: Secondary | ICD-10-CM | POA: Insufficient documentation

## 2016-04-18 DIAGNOSIS — W19XXXA Unspecified fall, initial encounter: Secondary | ICD-10-CM | POA: Diagnosis not present

## 2016-04-18 DIAGNOSIS — S32501A Unspecified fracture of right pubis, initial encounter for closed fracture: Secondary | ICD-10-CM | POA: Diagnosis not present

## 2016-04-18 DIAGNOSIS — S32511A Fracture of superior rim of right pubis, initial encounter for closed fracture: Secondary | ICD-10-CM | POA: Diagnosis not present

## 2016-04-18 DIAGNOSIS — M25552 Pain in left hip: Secondary | ICD-10-CM | POA: Diagnosis not present

## 2016-04-18 DIAGNOSIS — Z79899 Other long term (current) drug therapy: Secondary | ICD-10-CM | POA: Diagnosis not present

## 2016-04-18 DIAGNOSIS — R52 Pain, unspecified: Secondary | ICD-10-CM

## 2016-04-18 DIAGNOSIS — M25559 Pain in unspecified hip: Secondary | ICD-10-CM | POA: Diagnosis not present

## 2016-04-18 DIAGNOSIS — Z85118 Personal history of other malignant neoplasm of bronchus and lung: Secondary | ICD-10-CM | POA: Diagnosis not present

## 2016-04-18 DIAGNOSIS — Z87891 Personal history of nicotine dependence: Secondary | ICD-10-CM | POA: Insufficient documentation

## 2016-04-18 DIAGNOSIS — S32599A Other specified fracture of unspecified pubis, initial encounter for closed fracture: Secondary | ICD-10-CM | POA: Diagnosis not present

## 2016-04-18 HISTORY — DX: Celiac disease: K90.0

## 2016-04-18 LAB — CBC
HEMATOCRIT: 32.9 % — AB (ref 35.0–47.0)
HEMOGLOBIN: 11.1 g/dL — AB (ref 12.0–16.0)
MCH: 30.4 pg (ref 26.0–34.0)
MCHC: 33.7 g/dL (ref 32.0–36.0)
MCV: 90 fL (ref 80.0–100.0)
Platelets: 215 10*3/uL (ref 150–440)
RBC: 3.66 MIL/uL — ABNORMAL LOW (ref 3.80–5.20)
RDW: 13.6 % (ref 11.5–14.5)
WBC: 13.9 10*3/uL — ABNORMAL HIGH (ref 3.6–11.0)

## 2016-04-18 LAB — BASIC METABOLIC PANEL
Anion gap: 8 (ref 5–15)
BUN: 21 mg/dL — AB (ref 6–20)
CHLORIDE: 101 mmol/L (ref 101–111)
CO2: 20 mmol/L — AB (ref 22–32)
CREATININE: 0.91 mg/dL (ref 0.44–1.00)
Calcium: 8.3 mg/dL — ABNORMAL LOW (ref 8.9–10.3)
GFR calc Af Amer: >60 mL/min (ref >60)
GFR calc non Af Amer: >60 mL/min (ref >60)
GLUCOSE: 132 mg/dL — AB (ref 65–99)
Potassium: 3.3 mmol/L — ABNORMAL LOW (ref 3.5–5.1)
Sodium: 129 mmol/L — ABNORMAL LOW (ref 135–145)

## 2016-04-18 LAB — TSH: TSH: 3.247 u[IU]/mL (ref 0.350–4.500)

## 2016-04-18 MED ORDER — ACETAMINOPHEN 325 MG PO TABS: 650.0000 mg | ORAL_TABLET | Freq: Four times a day (QID) | ORAL | Status: DC | PRN

## 2016-04-18 MED ORDER — ONDANSETRON HCL 4 MG/2ML IJ SOLN
4.0000 mg | Freq: Once | INTRAMUSCULAR | Status: AC
  Administered 2016-04-18: 4 mg via INTRAVENOUS
  Filled 2016-04-18: qty 2

## 2016-04-18 MED ORDER — ONDANSETRON HCL 4 MG PO TABS: 4.0000 mg | ORAL_TABLET | Freq: Four times a day (QID) | ORAL | Status: DC | PRN

## 2016-04-18 MED ORDER — ACETAMINOPHEN 650 MG RE SUPP
650.0000 mg | Freq: Four times a day (QID) | RECTAL | Status: DC | PRN
Start: 2016-04-18 — End: 2016-04-19

## 2016-04-18 MED ORDER — IBUPROFEN 600 MG PO TABS
600.0000 mg | ORAL_TABLET | Freq: Once | ORAL | Status: AC
Start: 2016-04-18 — End: 2016-04-18
  Administered 2016-04-18: 600 mg via ORAL
  Filled 2016-04-18: qty 1

## 2016-04-18 MED ORDER — SODIUM CHLORIDE 0.9 % IV SOLN: INTRAVENOUS | Status: DC

## 2016-04-18 MED ORDER — MORPHINE SULFATE (PF) 2 MG/ML IV SOLN
2.0000 mg | Freq: Once | INTRAVENOUS | Status: AC
  Administered 2016-04-18: 2 mg via INTRAVENOUS
  Filled 2016-04-18: qty 1

## 2016-04-18 MED ORDER — DOCUSATE SODIUM 100 MG PO CAPS
100.0000 mg | ORAL_CAPSULE | Freq: Two times a day (BID) | ORAL | Status: DC
  Administered 2016-04-18 – 2016-04-19 (×2): 100 mg via ORAL
  Filled 2016-04-18 (×2): qty 1

## 2016-04-18 MED ORDER — CALCIUM CARBONATE ANTACID 500 MG PO CHEW
600.0000 mg | CHEWABLE_TABLET | Freq: Two times a day (BID) | ORAL | Status: DC
  Administered 2016-04-18 – 2016-04-19 (×2): 600 mg via ORAL
  Filled 2016-04-18 (×2): qty 3

## 2016-04-18 MED ORDER — ENOXAPARIN SODIUM 40 MG/0.4ML ~~LOC~~ SOLN
40.0000 mg | SUBCUTANEOUS | Status: DC
  Administered 2016-04-18: 40 mg via SUBCUTANEOUS
  Filled 2016-04-18: qty 0.4

## 2016-04-18 MED ORDER — OXYCODONE-ACETAMINOPHEN 5-325 MG PO TABS
ORAL_TABLET | ORAL | Status: AC
  Administered 2016-04-18: 1 via ORAL
  Filled 2016-04-18: qty 1

## 2016-04-18 MED ORDER — ADULT MULTIVITAMIN W/MINERALS CH
1.0000 | ORAL_TABLET | Freq: Every day | ORAL | Status: DC
  Administered 2016-04-19: 1 via ORAL
  Filled 2016-04-18: qty 1

## 2016-04-18 MED ORDER — OXYCODONE HCL 5 MG PO TABS
5.0000 mg | ORAL_TABLET | ORAL | Status: DC | PRN
  Administered 2016-04-18 – 2016-04-19 (×4): 5 mg via ORAL
  Filled 2016-04-18 (×5): qty 1

## 2016-04-18 MED ORDER — ONDANSETRON HCL 4 MG/2ML IJ SOLN: 4.0000 mg | Freq: Four times a day (QID) | INTRAMUSCULAR | Status: DC | PRN

## 2016-04-18 MED ORDER — POTASSIUM CHLORIDE CRYS ER 20 MEQ PO TBCR
40.0000 meq | EXTENDED_RELEASE_TABLET | Freq: Once | ORAL | Status: AC
  Administered 2016-04-18: 40 meq via ORAL
  Filled 2016-04-18: qty 2

## 2016-04-18 MED ORDER — ACETAMINOPHEN 325 MG PO TABS
650.0000 mg | ORAL_TABLET | Freq: Once | ORAL | Status: AC
  Administered 2016-04-18: 650 mg via ORAL
  Filled 2016-04-18: qty 2

## 2016-04-18 MED ORDER — VITAMIN D 1000 UNITS PO TABS
2000.0000 [IU] | ORAL_TABLET | Freq: Every day | ORAL | Status: DC
  Administered 2016-04-18 – 2016-04-19 (×2): 2000 [IU] via ORAL
  Filled 2016-04-18 (×2): qty 2

## 2016-04-18 MED ORDER — OXYCODONE-ACETAMINOPHEN 5-325 MG PO TABS
1.0000 | ORAL_TABLET | Freq: Once | ORAL | Status: AC
  Administered 2016-04-18: 1 via ORAL

## 2016-04-18 MED ORDER — KETOROLAC TROMETHAMINE 15 MG/ML IJ SOLN: 15.0000 mg | Freq: Four times a day (QID) | INTRAMUSCULAR | Status: DC | PRN

## 2016-04-18 MED ORDER — POLYETHYLENE GLYCOL 3350 17 G PO PACK: 17.0000 g | PACK | Freq: Every day | ORAL | Status: DC | PRN

## 2016-04-18 NOTE — ED Triage Notes (Signed)
Pt reports slipping and falling down 2 steps onto concrete. Reports left hip pain and lower pelvic pain. Pt denies hitting head, denies LOC. Pt without shortening or rotation of left leg. Positive distal pulses bilaterally.

## 2016-04-18 NOTE — H&P (Addendum)
[place] [facility] [place] NAME: Krista Evans    MR#:  [id number]  DATE OF BIRTH:  [birth year]  DATE OF ADMISSION:  04/18/2016  PRIMARY CARE PHYSICIAN: Adin Hector, MD   REQUESTING/REFERRING PHYSICIAN: Dr. Lisa Roca  CHIEF COMPLAINT:   Chief Complaint  Patient presents with  . Hip Pain    HISTORY OF PRESENT ILLNESS:  Krista Evans  is a 71 y.o. female with a known history of Right lung cancer status post partial lobectomy, celiac disease on gluten-free diet who is fairly active at baseline presents to hospital after a fall and noted to have pelvic fracture. Patient has steps coming down the kitchen into the garage, she always feels slightly dizzy and unsteady on the steps. She might have missed a step and she said she fell down. She did not hit her head. She did not pass out. X-rays in the emergency room revealed right pubic ramus and pubic body fracture. She is unable to bear weight and is in significant amount of pain. She is being admitted under observation for the same. Denies any fevers or chills. No recent nausea, vomiting, cough or chest pain.  PAST MEDICAL HISTORY:   Past Medical History:  Diagnosis Date  . Cancer [facility]) 05/2011   Right upper Lung CA with partial Lobectomy.  . Celiac disease     PAST SURGICAL HISTORY:   Past Surgical History:  Procedure Laterality Date  . LUNG LOBECTOMY     right lung    SOCIAL HISTORY:   Social History  Substance Use Topics  . Smoking status: Former Smoker    Types: Cigarettes    Quit date: 02/28/1986  . Smokeless tobacco: Never Used  . Alcohol use No    FAMILY HISTORY:   Family History  Problem Relation Age of Onset  . Breast cancer Cousin 9  . CAD Sister     DRUG ALLERGIES:   Allergies  Allergen Reactions  . Barley Grass Other (See Comments)    And rye Doesn't absorb in small intestines  . Wheat Bran Other (See Comments)    Doesn't absorb in small intestines     REVIEW OF SYSTEMS:   Review of Systems  Constitutional: Negative for fever, malaise/fatigue and weight loss.  HENT: Negative for ear discharge, ear pain, hearing loss and nosebleeds.   Eyes: Negative for blurred vision, double vision and photophobia.  Respiratory: Negative for cough, hemoptysis, shortness of breath and wheezing.   Cardiovascular: Negative for chest pain, palpitations, orthopnea and leg swelling.  Gastrointestinal: Negative for abdominal pain, constipation, diarrhea, heartburn, melena, nausea and vomiting.  Genitourinary: Negative for dysuria and urgency.  Musculoskeletal: Positive for joint pain and myalgias. Negative for back pain and neck pain.  Skin: Negative for rash.  Neurological: Negative for dizziness, tremors, sensory change, speech change and focal weakness.  Endo/Heme/Allergies: Does not bruise/bleed easily.  Psychiatric/Behavioral: Negative for depression.    MEDICATIONS AT HOME:   Prior to Admission medications   Medication Sig Start Date End Date Taking? Authorizing Provider  calcium carbonate (OS-CAL) 600 MG TABS tablet Take by mouth.    Historical Provider, MD  Cholecalciferol 1000 UNITS tablet Take 2,000 Units by mouth.     Historical Provider, MD  clobetasol cream (TEMOVATE) 0.05 %  07/27/15   Historical Provider, MD  Multiple Vitamin (MULTI-VITAMINS) TABS Take by mouth.    Historical Provider, MD      VITAL SIGNS:  Blood pressure (!) 147/89, pulse  81, temperature 98.1 F (36.7 C), temperature source Oral, resp. rate 16, height 5' 5"  (1.651 m), weight 63.5 kg (140 lb), SpO2 92 %.  PHYSICAL EXAMINATION:   Physical Exam  GENERAL:  71 y.o.-year-old patient lying in the bed with no acute distress.  EYES: Pupils equal, round, reactive to light and accommodation. No scleral icterus. Extraocular muscles intact.  HEENT: Head atraumatic, normocephalic. Oropharynx and nasopharynx clear.  NECK:  Supple, no jugular venous distention. No thyroid  enlargement, no tenderness.  LUNGS: Normal breath sounds bilaterally, no wheezing, rales,rhonchi or crepitation. No use of accessory muscles of respiration.  CARDIOVASCULAR: S1, S2 normal. No rubs, or gallops. 2/6 systolic murmur present ABDOMEN: Soft, nontender, nondistended. Bowel sounds present. No organomegaly or mass.  EXTREMITIES: No pedal edema, cyanosis, or clubbing.  NEUROLOGIC: Cranial nerves II through XII are intact. Muscle strength 5/5 in all extremities- limited movement of left hip due to pain. Sensation intact. Gait not checked.  PSYCHIATRIC: The patient is alert and oriented x 3.  SKIN: No obvious rash, lesion, or ulcer.   LABORATORY PANEL:   CBC No results for input(s): WBC, HGB, HCT, PLT in the last 168 hours. ------------------------------------------------------------------------------------------------------------------  Chemistries  No results for input(s): NA, K, CL, CO2, GLUCOSE, BUN, CREATININE, CALCIUM, MG, AST, ALT, ALKPHOS, BILITOT in the last 168 hours.  Invalid input(s): GFRCGP ------------------------------------------------------------------------------------------------------------------  Cardiac Enzymes No results for input(s): TROPONINI in the last 168 hours. ------------------------------------------------------------------------------------------------------------------  RADIOLOGY:  Dg Sacrum/coccyx  Result Date: 04/18/2016 CLINICAL DATA:  Left hip pain after falling down stairs. EXAM: SACRUM AND COCCYX - 2+ VIEW COMPARISON:  Pelvis and left hip radiographs obtained at the same time. FINDINGS: Again demonstrated is a comminuted fracture of the right pubic body and superior pubic ramus. The sacroiliac joints have normal appearances. Unremarkable hips. Lower lumbar spine degenerative changes and mild scoliosis. Abdominal aortic calcifications. IMPRESSION: 1. Comminuted right pubic body and superior pubic ramus fracture. 2. Normal appearing sacroiliac  joints. 3. Aortic atherosclerosis. Electronically Signed   By: Claudie Revering M.D.   On: 04/18/2016 09:47   Dg Hip Unilat W Or Wo Pelvis 2-3 Views Left  Result Date: 04/18/2016 CLINICAL DATA:  Left hip pain after falling down stairs. EXAM: DG HIP (WITH OR WITHOUT PELVIS) 2-3V LEFT COMPARISON:  None. FINDINGS: Comminuted fracture of the right pubic body and superior pubic ramus. Normal appearing hips without fracture or dislocation. Diffuse osteopenia. Lower lumbar spine degenerative changes and mild scoliosis. IMPRESSION: 1. Comminuted right pubic body and superior pubic ramus fracture. 2. No hip fracture or dislocation. 3. Lower lumbar spine degenerative changes and mild scoliosis. Electronically Signed   By: Claudie Revering M.D.   On: 04/18/2016 09:46    EKG:   Orders placed or performed in visit on 06/17/11  . EKG 12-Lead    IMPRESSION AND PLAN:   Krista Evans  is a 71 y.o. female with a known history of Right lung cancer status post partial lobectomy, celiac disease on gluten-free diet who is fairly active at baseline presents to hospital after a fall and noted to have pelvic fracture.  #1 pelvic fracture-secondary to fall, x-rays with right pubic body communited fracture and superior pubic from his fracture. -Pain control, weight bearing as tolerated. -Physical therapy consulted and care management consult for home health. -Continue calcium and vitamin D supplements.  #2. Celiac Disease-stable. On a gluten-free diet.  #3 other labs are pending at this time.  #4 DVT prophylaxis-Lovenox if renal function is okay.   All the  records are reviewed and case discussed with ED provider. Management plans discussed with the patient, family and they are in agreement.  CODE STATUS: Full code  TOTAL TIME TAKING CARE OF THIS PATIENT: 50 minutes.    Gladstone Lighter M.D on 04/18/2016 at 1:11 PM  Between 7am to 6pm - Pager - [phone]  After 6pm go to [email or web address] - password [email or web address]  [facility]  Office  [phone]  CC: Primary care physician; Adin Hector, MD

## 2016-04-18 NOTE — ED Provider Notes (Signed)
[facility] Emergency Department Provider Note ____________________________________________   I have reviewed the triage vital signs and the triage nursing note.  HISTORY  Chief Complaint Hip Pain   Historian Patient  HPI Krista Evans is a 71 y.o. female presenting for evaluation of left hip pain after fall. She slipped and fell down 2 steps onto concrete on her backside and left hip. She states it is very difficult getting to her knees and she was unable to stand up. She came in by EMS. Denies striking her head. No loss conscious. No neck pain. No chest pain or trouble breathing. No abdominal pain. No sensory loss in her lower extremities.    Past Medical History:  Diagnosis Date  . Cancer [facility]) 05/2011   Right upper Lung CA with partial Lobectomy.    Patient Active Problem List   Diagnosis Date Noted  . CD (celiac disease) 08/04/2014  . Cancer of upper lobe of right lung ([place]) 08/04/2014  . Age related osteoporosis 11/26/2013  . Absolute anemia 05/21/2013  . H/O malignant neoplasm 05/21/2013  . HLD (hyperlipidemia) 05/21/2013    No past surgical history on file.  Prior to Admission medications   Medication Sig Start Date End Date Taking? Authorizing Provider  calcium carbonate (OS-CAL) 600 MG TABS tablet Take by mouth.    Historical Provider, MD  Cholecalciferol 1000 UNITS tablet Take 2,000 Units by mouth.     Historical Provider, MD  clobetasol cream (TEMOVATE) 0.05 %  07/27/15   Historical Provider, MD  Multiple Vitamin (MULTI-VITAMINS) TABS Take by mouth.    Historical Provider, MD    Allergies  Allergen Reactions  . Barley Grass Other (See Comments)    And rye Doesn't absorb in small intestines  . Wheat Bran Other (See Comments)    Doesn't absorb in small intestines    Family History  Problem Relation Age of Onset  . Breast cancer Cousin 3    Social History Social History  Substance Use Topics  . Smoking status: Former  Smoker    Types: Cigarettes    Quit date: 02/28/1986  . Smokeless tobacco: Never Used  . Alcohol use No    Review of Systems  Constitutional: Negative for fever. Eyes: Negative for visual changes. ENT: Negative for sore throat. Cardiovascular: Negative for chest pain. Respiratory: Negative for shortness of breath. Gastrointestinal: Negative for abdominal pain, vomiting and diarrhea. Genitourinary: Negative for dysuria. Musculoskeletal: Negative for back pain.  Left hip pain and tailbone pain Skin: Negative for rash. Neurological: Negative for headache. 10 point Review of Systems otherwise negative ____________________________________________   PHYSICAL EXAM:  VITAL SIGNS: ED Triage Vitals  Enc Vitals Group     BP 04/18/16 0901 (!) 158/90     Pulse Rate 04/18/16 0901 82     Resp 04/18/16 0901 16     Temp 04/18/16 0901 98.1 F (36.7 C)     Temp Source 04/18/16 0901 Oral     SpO2 04/18/16 0901 97 %     Weight 04/18/16 0901 140 lb (63.5 kg)     Height 04/18/16 0901 5' 5"  (1.651 m)     Head Circumference --      Peak Flow --      Pain Score 04/18/16 0902 7     Pain Loc --      Pain Edu? --      Excl. in [place]? --      Constitutional: Alert and oriented. Well appearing and in no distress. HEENT  Head: Normocephalic and atraumatic.      Eyes: Conjunctivae are normal. PERRL. Normal extraocular movements.      Ears:         Nose: No congestion/rhinnorhea.   Mouth/Throat: Mucous membranes are moist.   Neck: No stridor.  No posterior midline c spine tenderness. Cardiovascular/Chest: Normal rate, regular rhythm.  No murmurs, rubs, or gallops. Respiratory: Normal respiratory effort without tachypnea nor retractions. Breath sounds are clear and equal bilaterally. No wheezes/rales/rhonchi. Gastrointestinal: Soft. No distention, no guarding, no rebound. Nontender.    Genitourinary/rectal:Deferred Musculoskeletal: Pelvis stable.  Pain at left hip and posteriorly at  tailbone.  No upper extremity pain or rle pain. Neurologic:  Normal speech and language. No gross or focal neurologic deficits are appreciated. Skin:  Skin is warm, dry.  Discoloration and slightly open skin on left shin. Psychiatric: Mood and affect are normal. Speech and behavior are normal. Patient exhibits appropriate insight and judgment.   ____________________________________________  LABS (pertinent positives/negatives)  Labs Reviewed - No data to display  ____________________________________________    EKG I, Lisa Roca, MD, the attending physician have personally viewed and interpreted all ECGs.  None ____________________________________________  RADIOLOGY All Xrays were viewed by me. Imaging interpreted by Radiologist.  Left hip with pelvis dg: IMPRESSION: 1. Comminuted right pubic body and superior pubic ramus fracture. 2. No hip fracture or dislocation. 3. Lower lumbar spine degenerative changes and mild scoliosis.  Sacrum/coccyx dg: IMPRESSION: 1. Comminuted right pubic body and superior pubic ramus fracture. 2. Normal appearing sacroiliac joints. 3. Aortic atherosclerosis. __________________________________________  PROCEDURES  Procedure(s) performed: None  Critical Care performed: None  ____________________________________________   ED COURSE / ASSESSMENT AND PLAN  Pertinent labs & imaging results that were available during my care of the patient were reviewed by me and considered in my medical decision making (see chart for details).   Left hip and sacral pain after fall onto buttock and left hip onto concrete.  No additional traumatic injuries.  Left shin skin changes due to treatment for skin cancer.  X-ray shows a comminuted right sided pelvic fractures. Patient was initially very hesitant about any narcotic medications and tried Tylenol and ibuprofen. She was unable to stand or step.  I did give her Percocet. After an hour so we checked  again and she was still unable to move or have adequate pain control. Her son is here from [place] and in discussions we decided that admission for pain control and physical therapy evaluation was the best option.  I spoke with Dr. Sabra Heck, orthopedics, who recommended toe-touch weightbearing, admission for acute fracture pain control as needed.   CONSULTATIONS:  Mr. Sabra Heck, orthopedics, reviewed case over the phone and viewed images.  Patient / Family / Caregiver informed of clinical course, medical decision-making process, and agree with plan.    ___________________________________________   FINAL CLINICAL IMPRESSION(S) / ED DIAGNOSES   Final diagnoses:  Pain  Acute pain due to trauma  Closed fracture of right superior pubic ramus, initial encounter [facility])              Note: This dictation was prepared with Dragon dictation. Any transcriptional errors that result from this process are unintentional    Lisa Roca, MD 04/18/16 1234

## 2016-04-19 ENCOUNTER — Encounter
Admission: RE | Admit: 2016-04-19 | Discharge: 2016-04-19 | Disposition: A | Discharge status: Home / Self Care | Payer: PPO | Source: Ambulatory Visit | Attending: Internal Medicine | Admitting: Internal Medicine

## 2016-04-19 DIAGNOSIS — S32599A Other specified fracture of unspecified pubis, initial encounter for closed fracture: Secondary | ICD-10-CM | POA: Diagnosis not present

## 2016-04-19 DIAGNOSIS — S32511A Fracture of superior rim of right pubis, initial encounter for closed fracture: Secondary | ICD-10-CM | POA: Diagnosis not present

## 2016-04-19 DIAGNOSIS — R262 Difficulty in walking, not elsewhere classified: Secondary | ICD-10-CM | POA: Diagnosis not present

## 2016-04-19 DIAGNOSIS — M6281 Muscle weakness (generalized): Secondary | ICD-10-CM | POA: Diagnosis not present

## 2016-04-19 DIAGNOSIS — K900 Celiac disease: Secondary | ICD-10-CM | POA: Diagnosis not present

## 2016-04-19 DIAGNOSIS — M80051D Age-related osteoporosis with current pathological fracture, right femur, subsequent encounter for fracture with routine healing: Secondary | ICD-10-CM | POA: Diagnosis not present

## 2016-04-19 DIAGNOSIS — N390 Urinary tract infection, site not specified: Secondary | ICD-10-CM | POA: Diagnosis not present

## 2016-04-19 DIAGNOSIS — R6889 Other general symptoms and signs: Secondary | ICD-10-CM | POA: Diagnosis not present

## 2016-04-19 DIAGNOSIS — Z7401 Bed confinement status: Secondary | ICD-10-CM | POA: Diagnosis not present

## 2016-04-19 DIAGNOSIS — B9620 Unspecified Escherichia coli [E. coli] as the cause of diseases classified elsewhere: Secondary | ICD-10-CM | POA: Diagnosis not present

## 2016-04-19 DIAGNOSIS — Z9181 History of falling: Secondary | ICD-10-CM | POA: Diagnosis not present

## 2016-04-19 DIAGNOSIS — R41841 Cognitive communication deficit: Secondary | ICD-10-CM | POA: Diagnosis not present

## 2016-04-19 LAB — BASIC METABOLIC PANEL
Anion gap: 5 (ref 5–15)
BUN: 20 mg/dL (ref 6–20)
CO2: 25 mmol/L (ref 22–32)
CREATININE: 1.15 mg/dL — AB (ref 0.44–1.00)
Calcium: 8.8 mg/dL — ABNORMAL LOW (ref 8.9–10.3)
Chloride: 105 mmol/L (ref 101–111)
GFR calc Af Amer: 53 mL/min — ABNORMAL LOW (ref >60)
GFR, EST NON AFRICAN AMERICAN: 46 mL/min — AB (ref >60)
GLUCOSE: 99 mg/dL (ref 65–99)
POTASSIUM: 3.8 mmol/L (ref 3.5–5.1)
SODIUM: 135 mmol/L (ref 135–145)

## 2016-04-19 LAB — CBC
HEMATOCRIT: 30.8 % — AB (ref 35.0–47.0)
Hemoglobin: 10.5 g/dL — ABNORMAL LOW (ref 12.0–16.0)
MCH: 31.3 pg (ref 26.0–34.0)
MCHC: 34.2 g/dL (ref 32.0–36.0)
MCV: 91.6 fL (ref 80.0–100.0)
PLATELETS: 181 10*3/uL (ref 150–440)
RBC: 3.36 MIL/uL — ABNORMAL LOW (ref 3.80–5.20)
RDW: 13.8 % (ref 11.5–14.5)
WBC: 7.3 10*3/uL (ref 3.6–11.0)

## 2016-04-19 MED ORDER — OXYCODONE-ACETAMINOPHEN 5-325 MG PO TABS
1.0000 | ORAL_TABLET | Freq: Four times a day (QID) | ORAL | Status: DC | PRN
  Administered 2016-04-19: 1 via ORAL
  Filled 2016-04-19: qty 1

## 2016-04-19 MED ORDER — OXYCODONE-ACETAMINOPHEN 5-325 MG PO TABS: 1.0000 | ORAL_TABLET | Freq: Four times a day (QID) | ORAL | 0 refills | Status: DC | PRN

## 2016-04-19 NOTE — Care Management (Signed)
RNCM consult received for home health needs. PT recommending SNF. CSW following. RNCM to sign off. Call if needs arise. Case Closed

## 2016-04-19 NOTE — Care Management Obs Status (Signed)
[place] NOTIFICATION   Patient Details  Name: Krista Evans MRN: [id number] Date of Birth: [birth year]   Medicare Observation Status Notification Given:  Yes    Jolly Mango, RN 04/19/2016, 3:00 PM

## 2016-04-19 NOTE — Discharge Summary (Signed)
[place] [place] NAME: Cristina Ceniceros    MR#:  [id number]  DATE OF BIRTH:  Jan 31, 1945  DATE OF ADMISSION:  04/18/2016 ADMITTING PHYSICIAN: Gladstone Lighter, MD  DATE OF DISCHARGE: 04/19/2016  PRIMARY CARE PHYSICIAN: Tama High III, MD    ADMISSION DIAGNOSIS:  Pain [R52] Acute pain due to trauma [G89.11] Closed fracture of right superior pubic ramus, initial encounter ([place]) [S32.511A]  DISCHARGE DIAGNOSIS:  Right side Pelvic fracture s/p mechanical fall  SECONDARY DIAGNOSIS:   Past Medical History:  Diagnosis Date  . Cancer [facility]) 05/2011   Right upper Lung CA with partial Lobectomy.  . Celiac disease     HOSPITAL COURSE:   Andreya Lacks a 71 y.o. femalewith a known history of Right lung cancer status post partial lobectomy, celiac disease on gluten-free diet who is fairly active at baseline presents to hospital after a fall and noted to have pelvic fracture.  #1 Acute pelvic fracture-secondary to fall, x-rays with right pubic body communitedfracture and superior pubic from his fracture. -Pain control, weight bearing as tolerated. -Physical therapy consulted recommends Rehab -Continue calcium and vitamin D supplements.  #2. CeliacDisease-stable. On a gluten-free diet.  #3 DVT prophylaxis-Lovenox   Pt will be discharged to rehab today  CONSULTS OBTAINED:    DRUG ALLERGIES:   Allergies  Allergen Reactions  . Barley Grass Other (See Comments)    And rye Doesn't absorb in small intestines  . Wheat Bran Other (See Comments)    Doesn't absorb in small intestines    DISCHARGE MEDICATIONS:   Current Discharge Medication List    START taking these medications   Details  oxyCODONE-acetaminophen (PERCOCET/ROXICET) 5-325 MG tablet Take 1-2 tablets by mouth every 6 (six) hours as needed for moderate pain or severe pain. Qty: 25 tablet, Refills: 0      CONTINUE these medications which have NOT CHANGED    Details  calcium carbonate (OS-CAL) 600 MG TABS tablet Take by mouth.   Associated Diagnoses: Malignant neoplasm of upper lobe of right lung (HCC)    Cholecalciferol 1000 UNITS tablet Take 2,000 Units by mouth.    Associated Diagnoses: Malignant neoplasm of upper lobe of right lung (HCC)    clobetasol cream (TEMOVATE) 0.05 %     Multiple Vitamin (MULTI-VITAMINS) TABS Take by mouth.   Associated Diagnoses: Malignant neoplasm of upper lobe of right lung [facility])        If you experience worsening of your admission symptoms, develop shortness of breath, life threatening emergency, suicidal or homicidal thoughts you must seek medical attention immediately by calling 911 or calling your MD immediately  if symptoms less severe.  You Must read complete instructions/literature along with all the possible adverse reactions/side effects for all the Medicines you take and that have been prescribed to you. Take any new Medicines after you have completely understood and accept all the possible adverse reactions/side effects.   Please note  You were cared for by a hospitalist during your hospital stay. If you have any questions about your discharge medications or the care you received while you were in the hospital after you are discharged, you can call the unit and asked to speak with the hospitalist on call if the hospitalist that took care of you is not available. Once you are discharged, your primary care physician will handle any further medical issues. Please note that NO REFILLS for any discharge medications will be authorized once you are discharged, as it  is imperative that you return to your primary care physician (or establish a relationship with a primary care physician if you do not have one) for your aftercare needs so that they can reassess your need for medications and monitor your lab values. Today   SUBJECTIVE  Hip pain   VITAL SIGNS:  Blood pressure (!) 118/57, pulse 76, temperature  98.9 F (37.2 C), temperature source Oral, resp. rate 18, height 5' 5"  (1.651 m), weight 60.7 kg (133 lb 12.8 oz), SpO2 93 %.  I/O:   Intake/Output Summary (Last 24 hours) at 04/19/16 1408 Last data filed at 04/19/16 0417  Gross per 24 hour  Intake              720 ml  Output              350 ml  Net              370 ml    PHYSICAL EXAMINATION:  GENERAL:  71 y.o.-year-old patient lying in the bed with no acute distress.  EYES: Pupils equal, round, reactive to light and accommodation. No scleral icterus. Extraocular muscles intact.  HEENT: Head atraumatic, normocephalic. Oropharynx and nasopharynx clear.  NECK:  Supple, no jugular venous distention. No thyroid enlargement, no tenderness.  LUNGS: Normal breath sounds bilaterally, no wheezing, rales,rhonchi or crepitation. No use of accessory muscles of respiration.  CARDIOVASCULAR: S1, S2 normal. No murmurs, rubs, or gallops.  ABDOMEN: Soft, non-tender, non-distended. Bowel sounds present. No organomegaly or mass.  EXTREMITIES: No pedal edema, cyanosis, or clubbing.  NEUROLOGIC: Cranial nerves II through XII are intact. Muscle strength 5/5 in all extremities. Sensation intact. Gait not checked.  PSYCHIATRIC: The patient is alert and oriented x 3.  SKIN: No obvious rash, lesion, or ulcer.   DATA REVIEW:   CBC   Recent Labs Lab 04/19/16 0521  WBC 7.3  HGB 10.5*  HCT 30.8*  PLT 181    Chemistries   Recent Labs Lab 04/19/16 0521  NA 135  K 3.8  CL 105  CO2 25  GLUCOSE 99  BUN 20  CREATININE 1.15*  CALCIUM 8.8*    Microbiology Results   No results found for this or any previous visit (from the past 240 hour(s)).  RADIOLOGY:  Dg Sacrum/coccyx  Result Date: 04/18/2016 CLINICAL DATA:  Left hip pain after falling down stairs. EXAM: SACRUM AND COCCYX - 2+ VIEW COMPARISON:  Pelvis and left hip radiographs obtained at the same time. FINDINGS: Again demonstrated is a comminuted fracture of the right pubic body and  superior pubic ramus. The sacroiliac joints have normal appearances. Unremarkable hips. Lower lumbar spine degenerative changes and mild scoliosis. Abdominal aortic calcifications. IMPRESSION: 1. Comminuted right pubic body and superior pubic ramus fracture. 2. Normal appearing sacroiliac joints. 3. Aortic atherosclerosis. Electronically Signed   By: Claudie Revering M.D.   On: 04/18/2016 09:47   Dg Hip Unilat W Or Wo Pelvis 2-3 Views Left  Result Date: 04/18/2016 CLINICAL DATA:  Left hip pain after falling down stairs. EXAM: DG HIP (WITH OR WITHOUT PELVIS) 2-3V LEFT COMPARISON:  None. FINDINGS: Comminuted fracture of the right pubic body and superior pubic ramus. Normal appearing hips without fracture or dislocation. Diffuse osteopenia. Lower lumbar spine degenerative changes and mild scoliosis. IMPRESSION: 1. Comminuted right pubic body and superior pubic ramus fracture. 2. No hip fracture or dislocation. 3. Lower lumbar spine degenerative changes and mild scoliosis. Electronically Signed   By: Claudie Revering M.D.   On: 04/18/2016  09:46     Management plans discussed with the patient, family and they are in agreement.  CODE STATUS:     Code Status Orders        Start     Ordered   04/18/16 1646  Full code  Continuous     04/18/16 1645    Code Status History    Date Active Date Inactive Code Status Order ID Comments User Context   This patient has a current code status but no historical code status.      TOTAL TIME TAKING CARE OF THIS PATIENT: 40 minutes.    Jamile Rekowski M.D on 04/19/2016 at 2:08 PM  Between 7am to 6pm - Pager - [phone] After 6pm go to [email or web address] - password [email or web address] [facility]  Office  [phone]  CC: Primary care physician; Adin Hector, MD

## 2016-04-19 NOTE — Evaluation (Signed)
Physical Therapy Evaluation Patient Details Name: Krista Evans MRN: [id number] DOB: 1945-04-19 Today's Date: 04/19/2016   History of Present Illness  Pt is a 71 y.o. female s/p fall down 2 steps onto concrete on her backside and hip.  Imaging demonstrating comminuted R pubic body and superior pubic ramus fx.  Per ED MD note, Dr. Sabra Heck (orthopedics) recommending toe-touch WB'ing.  PMH includes R upper lung CA with partial lobectomy, celiac disease, HLD, anemia, and osteoporosis.  Clinical Impression  Prior to hospital admission, pt reports being independent with functional mobility.  Pt lives with her husband on main floor of home with steps to enter.  Pt's O2 on room air 83-87% upon PT entering room (nursing notified and placed pt on 2 L O2 via nasal cannula and O2 sats 94% or greater rest of PT session).  Currently pt is max assist x1 with bed mobility, max assist x2 with standing using RW, and pt unable to take any steps with max cueing and use of RW (pt assisted back into bed for safety).  Pt demonstrating L lean in sitting and standing requiring assist to reposition (anticipate L lean d/t R pelvic pain).  Pt would benefit from skilled PT to address noted impairments and functional limitations.  Recommend pt discharge to STR when medically appropriate.    Follow Up Recommendations SNF    Equipment Recommendations  Rolling walker with 5" wheels    Recommendations for Other Services       Precautions / Restrictions Precautions Precautions: Fall Restrictions Weight Bearing Restrictions: Yes Other Position/Activity Restrictions: Toe touch WB'ing R LE      Mobility  Bed Mobility Overal bed mobility: Needs Assistance Bed Mobility: Supine to Sit;Sit to Supine     Supine to sit: Max assist;HOB elevated Sit to supine: Max assist;HOB elevated   General bed mobility comments: vc's for technique and to use bed siderail; pt requiring assist for R>L LE and trunk; increased time to  perform; 2 assist to boost pt up in bed  Transfers Overall transfer level: Needs assistance Equipment used: Rolling walker (2 wheeled) Transfers: Sit to/from Stand Sit to Stand: Max assist;+2 physical assistance         General transfer comment: pt unable to stand with max vc's and tactile cues for positioning and technique with 1 assist (x2 trials) but pt able to stand with max assist x2  Ambulation/Gait     Assistive device: Rolling walker (2 wheeled)       General Gait Details: pt unable to initiate step with R LE or L LE with max vc's and tactile cues; increased L lean noted with time standing (pt utilizing RW)  Stairs            Wheelchair Mobility    Modified Rankin (Stroke Patients Only)       Balance Overall balance assessment: Needs assistance Sitting-balance support: Feet supported;Bilateral upper extremity supported Sitting balance-Leahy Scale: Poor Sitting balance - Comments: increased L lean with sitting requiring mod to max assist to stay close to midline; max vc's and tactile cues given attempting to correct lean Postural control: Left lateral lean Standing balance support: Bilateral upper extremity supported Standing balance-Leahy Scale: Poor Standing balance comment: increased L lean with standing using RW requiring mod assist for upright                             Pertinent Vitals/Pain Pain Assessment: Faces Faces Pain Scale: Hurts  even more (0/10 at rest) Pain Location: R pelvis with acitivity Pain Descriptors / Indicators: Sore Pain Intervention(s): Limited activity within patient's tolerance;Monitored during session;Premedicated before session;Repositioned  HR WFL during session.    Home Living Family/patient expects to be discharged to:: Private residence Living Arrangements: Spouse/significant other Available Help at Discharge: Family Type of Home: House Home Access: Stairs to enter Entrance Stairs-Rails: None Entrance  Stairs-Number of Steps: 3 Home Layout: Two level;Able to live on main level with bedroom/bathroom Home Equipment: Walker - 2 wheels      Prior Function Level of Independence: Independent         Comments: Home Living and PLOF information provided by pt although pt did not appear confident with all of her answers.     Hand Dominance        Extremity/Trunk Assessment   Upper Extremity Assessment Upper Extremity Assessment: Generalized weakness    Lower Extremity Assessment Lower Extremity Assessment: RLE deficits/detail;LLE deficits/detail RLE Deficits / Details: hip flexion at least 2/5; knee flexion/extension at least 3/5 AROM; DF at least 3/5 AROM LLE Deficits / Details: hip flexion, knee flexion/extension, and DF at least 3/5 AROM       Communication   Communication: No difficulties  Cognition Arousal/Alertness: Awake/alert   Overall Cognitive Status:  (Oriented to person, place, month/year, and situation.)                                 General Comments: Pt requiring increased time to respond to questions and to initiate movement with activities.      General Comments   Nursing cleared pt for participation in physical therapy.  Pt agreeable to PT session.    Exercises  Sitting/standing balance and posture training and transfer training x25 minutes.   Assessment/Plan    PT Assessment Patient needs continued PT services  PT Problem List Decreased strength;Decreased activity tolerance;Decreased balance;Decreased mobility;Decreased knowledge of use of DME;Decreased knowledge of precautions;Pain       PT Treatment Interventions DME instruction;Gait training;Stair training;Functional mobility training;Therapeutic activities;Therapeutic exercise;Balance training;Patient/family education    PT Goals (Current goals can be found in the Care Plan section)  Acute Rehab PT Goals Patient Stated Goal: to be able to walk again PT Goal Formulation: With  patient Time For Goal Achievement: 05/03/16 Potential to Achieve Goals: Good    Frequency 7X/week   Barriers to discharge Decreased caregiver support      Co-evaluation               End of Session Equipment Utilized During Treatment: Gait belt;Oxygen (2 L O2 via nasal cannula) Activity Tolerance: Patient limited by fatigue;Patient limited by pain Patient left: in bed;with call bell/phone within reach;with family/visitor present (B heels elevated via pillow; bed alarm not activating on bed (nursing notified who reported they would check on this); pt's son and husband present in room end of session) Nurse Communication: Mobility status;Precautions (O2 sats during session) PT Visit Diagnosis: Difficulty in walking, not elsewhere classified (R26.2);History of falling (Z91.81);Muscle weakness (generalized) (M62.81);Pain Pain - Right/Left: Right Pain - part of body: Hip    Time: 0160-1093 PT Time Calculation (min) (ACUTE ONLY): 40 min   Charges:   PT Evaluation $PT Eval Low Complexity: 1 Procedure PT Treatments $Therapeutic Activity: 23-37 mins   PT G Codes:   PT G-Codes **NOT FOR INPATIENT CLASS** Functional Assessment Tool Used: AM-PAC 6 Clicks Basic Mobility Functional Limitation: Mobility: Walking and moving  around Mobility: Walking and Moving Around Current Status [phone]): 100 percent impaired, limited or restricted Mobility: Walking and Moving Around Goal Status [phone]): At least 20 percent but less than 40 percent impaired, limited or restricted    Leitha Bleak, PT 04/19/16, 11:34 AM [phone]

## 2016-04-19 NOTE — NC FL2 (Signed)
[place] LEVEL OF CARE SCREENING TOOL     IDENTIFICATION  Patient Name: Krista Evans Birthdate: 10/11/1945 Sex: female Admission Date (Current Location): 04/18/2016  [place] and [place] Number:  Engineering geologist and Address:  [facility], [address] [place], [place] [address]      Provider Number: [id number]  Attending Physician Name and Address:  Fritzi Mandes, MD  Relative Name and Phone Number:       Current Level of Care: Hospital Recommended Level of Care: [place] Prior Approval Number:    Date Approved/Denied:   PASRR Number:  ([id number])  Discharge Plan: SNF    Current Diagnoses: Patient Active Problem List   Diagnosis Date Noted  . Pelvic fracture ([place]) 04/18/2016  . CD (celiac disease) 08/04/2014  . Cancer of upper lobe of right lung ([place]) 08/04/2014  . Age related osteoporosis 11/26/2013  . Absolute anemia 05/21/2013  . H/O malignant neoplasm 05/21/2013  . HLD (hyperlipidemia) 05/21/2013    Orientation RESPIRATION BLADDER Height & Weight     Self, Situation, Place  Normal Continent Weight: 133 lb 12.8 oz (60.7 kg) Height:  5' 5"  (165.1 cm)  BEHAVIORAL SYMPTOMS/MOOD NEUROLOGICAL BOWEL NUTRITION STATUS   (none)  (none) Continent Diet (Diet: Gluten Free )  AMBULATORY STATUS COMMUNICATION OF NEEDS Skin   Extensive Assist Verbally Normal                       Personal Care Assistance Level of Assistance  Bathing, Feeding, Dressing Bathing Assistance: Limited assistance Feeding assistance: Independent Dressing Assistance: Limited assistance     Functional Limitations Info  Sight, Hearing, Speech Sight Info: Adequate Hearing Info: Adequate Speech Info: Adequate    SPECIAL CARE FACTORS FREQUENCY  PT (By licensed PT), OT (By licensed OT)     PT Frequency:  (5) OT Frequency:  (5)            Contractures      Additional Factors Info  Code Status, Allergies Code Status  Info:  (Full Code. ) Allergies Info:  (Barley Grass, Wheat Bran)           Current Medications (04/19/2016):  This is the current hospital active medication list Current Facility-Administered Medications  Medication Dose Route Frequency Provider Last Rate Last Dose  . 0.9 %  sodium chloride infusion   Intravenous Continuous Gladstone Lighter, MD      . acetaminophen (TYLENOL) tablet 650 mg  650 mg Oral Q6H PRN Gladstone Lighter, MD       Or  . acetaminophen (TYLENOL) suppository 650 mg  650 mg Rectal Q6H PRN Gladstone Lighter, MD      . calcium carbonate (TUMS - dosed in mg elemental calcium) chewable tablet 600 mg of elemental calcium  600 mg of elemental calcium Oral BID WC Gladstone Lighter, MD   600 mg of elemental calcium at 04/19/16 0952  . cholecalciferol (VITAMIN D) tablet 2,000 Units  2,000 Units Oral Daily Gladstone Lighter, MD   2,000 Units at 04/19/16 [phone]  . docusate sodium (COLACE) capsule 100 mg  100 mg Oral BID Gladstone Lighter, MD   100 mg at 04/19/16 0952  . enoxaparin (LOVENOX) injection 40 mg  40 mg Subcutaneous Q24H Gladstone Lighter, MD   40 mg at 04/18/16 1755  . ketorolac (TORADOL) 15 MG/ML injection 15 mg  15 mg Intravenous Q6H PRN Gladstone Lighter, MD      . multivitamin with minerals tablet 1 tablet  1 tablet Oral Daily Gladstone Lighter, MD   1 tablet at 04/19/16 [phone]  . ondansetron (ZOFRAN) tablet 4 mg  4 mg Oral Q6H PRN Gladstone Lighter, MD       Or  . ondansetron (ZOFRAN) injection 4 mg  4 mg Intravenous Q6H PRN Gladstone Lighter, MD      . oxyCODONE-acetaminophen (PERCOCET/ROXICET) 5-325 MG per tablet 1-2 tablet  1-2 tablet Oral Q6H PRN Fritzi Mandes, MD   1 tablet at 04/19/16 [phone]  . polyethylene glycol (MIRALAX / GLYCOLAX) packet 17 g  17 g Oral Daily PRN Gladstone Lighter, MD         Discharge Medications: Please see discharge summary for a list of discharge medications.  Relevant Imaging Results:  Relevant Lab Results:   Additional Information   (SSN: [id number])  Tasmin Exantus, Veronia Beets, LCSW

## 2016-04-19 NOTE — Clinical Social Work Note (Signed)
Clinical Social Work Assessment  Patient Details  Name: Krista Evans MRN: [id number] Date of Birth: [birth year]  Date of referral:  04/19/16               Reason for consult:  Facility Placement                Permission sought to share information with:  Chartered certified accountant granted to share information::  Yes, Verbal Permission Granted  Name::      IT sales professional::   [place]   Relationship::     Contact Information:     Housing/Transportation Living arrangements for the past 2 months:  [place] of Information:  Patient, Adult Children Patient Interpreter Needed:  None Criminal Activity/Legal Involvement Pertinent to Current Situation/Hospitalization:  No - Comment as needed Significant Relationships:  Adult Children, Spouse Lives with:  Spouse Do you feel safe going back to the place where you live?  Yes Need for family participation in patient care:  Yes (Comment)  Care giving concerns:  Patient lives with her husband Fritz Pickerel in [place].    Social Worker assessment / plan: [place] representative (CSW) received SNF consult. PT is recommending SNF. CSW contacted patient's son Elta Guadeloupe to complete assessment. Per son patient lives in [place] with her husband and they would like for her to go to [place] for rehab. CSW explained that Health Team will have to approve SNF. Son verbalized his understanding. FL2 complete and faxed out.   CSW presented bed offers to son and he chose [facility]. Per MD patient is medically stable for D/C to [facility] today. Per [facility] admissions coordinator at [facility] patient can come today to room 201-A. RN will call report at [phone] and arrange EMS for transport. Health Team authorization has been received, auth # [id number]. CSW sent D/C orders to [facility] via [place]. Patient is aware of above and son Elta Guadeloupe is aware of above. Please reconsult if future social work needs arise. CSW signing off.    Employment status:  Retired Nurse, adult PT Recommendations:  [place] / Referral to community resources:  [place]  Patient/Family's Response to care:  Patient and son Elta Guadeloupe are agreeable for patient to go to [place] [facility] today.   Patient/Family's Understanding of and Emotional Response to Diagnosis, Current Treatment, and Prognosis:  Patient and son were very pleasant and thanked CSW for assistance.   Emotional Assessment Appearance:  Appears stated age Attitude/Demeanor/Rapport:    Affect (typically observed):  Accepting, Adaptable, Pleasant Orientation:  Oriented to Self, Oriented to Place, Fluctuating Orientation (Suspected and/or reported [place]) Alcohol / Substance use:  Not Applicable Psych involvement (Current and /or in the community):  No (Comment)  Discharge Needs  Concerns to be addressed:  Discharge Planning Concerns Readmission within the last 30 days:  No Current discharge risk:  None Barriers to Discharge:  No Barriers Identified   Dayton Kenley, Veronia Beets, LCSW 04/19/2016, 2:30 PM

## 2016-04-19 NOTE — Progress Notes (Signed)
[facility] [place] NAME: Krista Evans    MR#:  [id number]  DATE OF BIRTH:  Aug 19, 1945  SUBJECTIVE:  Came in after mechanical fall at home c/o hip pain  REVIEW OF SYSTEMS:   Review of Systems  Constitutional: Negative for chills, fever and weight loss.  HENT: Negative for ear discharge, ear pain and nosebleeds.   Eyes: Negative for blurred vision, pain and discharge.  Respiratory: Negative for sputum production, shortness of breath, wheezing and stridor.   Cardiovascular: Negative for chest pain, palpitations, orthopnea and PND.  Gastrointestinal: Negative for abdominal pain, diarrhea, nausea and vomiting.  Genitourinary: Negative for frequency and urgency.  Musculoskeletal: Positive for joint pain. Negative for back pain.  Neurological: Positive for weakness. Negative for sensory change, speech change and focal weakness.  Psychiatric/Behavioral: Negative for depression and hallucinations. The patient is not nervous/anxious.    Tolerating Diet:yes Tolerating PT: rehab  DRUG ALLERGIES:   Allergies  Allergen Reactions  . Barley Grass Other (See Comments)    And rye Doesn't absorb in small intestines  . Wheat Bran Other (See Comments)    Doesn't absorb in small intestines    VITALS:  Blood pressure (!) 128/59, pulse 76, temperature 98.7 F (37.1 C), temperature source Oral, resp. rate 19, height 5' 5"  (1.651 m), weight 60.7 kg (133 lb 12.8 oz), SpO2 93 %.  PHYSICAL EXAMINATION:   Physical Exam  GENERAL:  71 y.o.-year-old patient lying in the bed with no acute distress.  EYES: Pupils equal, round, reactive to light and accommodation. No scleral icterus. Extraocular muscles intact.  HEENT: Head atraumatic, normocephalic. Oropharynx and nasopharynx clear.  NECK:  Supple, no jugular venous distention. No thyroid enlargement, no tenderness.  LUNGS: Normal breath sounds bilaterally, no wheezing, rales, rhonchi. No use of  accessory muscles of respiration.  CARDIOVASCULAR: S1, S2 normal. No murmurs, rubs, or gallops.  ABDOMEN: Soft, nontender, nondistended. Bowel sounds present. No organomegaly or mass.  EXTREMITIES: No cyanosis, clubbing or edema b/l.    NEUROLOGIC: Cranial nerves II through XII are intact. No focal Motor or sensory deficits b/l.   PSYCHIATRIC:  patient is alert and oriented x 3.  SKIN: No obvious rash, lesion, or ulcer.   LABORATORY PANEL:  CBC  Recent Labs Lab 04/19/16 0521  WBC 7.3  HGB 10.5*  HCT 30.8*  PLT 181    Chemistries   Recent Labs Lab 04/19/16 0521  NA 135  K 3.8  CL 105  CO2 25  GLUCOSE 99  BUN 20  CREATININE 1.15*  CALCIUM 8.8*   Cardiac Enzymes No results for input(s): TROPONINI in the last 168 hours. RADIOLOGY:  Dg Sacrum/coccyx  Result Date: 04/18/2016 CLINICAL DATA:  Left hip pain after falling down stairs. EXAM: SACRUM AND COCCYX - 2+ VIEW COMPARISON:  Pelvis and left hip radiographs obtained at the same time. FINDINGS: Again demonstrated is a comminuted fracture of the right pubic body and superior pubic ramus. The sacroiliac joints have normal appearances. Unremarkable hips. Lower lumbar spine degenerative changes and mild scoliosis. Abdominal aortic calcifications. IMPRESSION: 1. Comminuted right pubic body and superior pubic ramus fracture. 2. Normal appearing sacroiliac joints. 3. Aortic atherosclerosis. Electronically Signed   By: Claudie Revering M.D.   On: 04/18/2016 09:47   Dg Hip Unilat W Or Wo Pelvis 2-3 Views Left  Result Date: 04/18/2016 CLINICAL DATA:  Left hip pain after falling down stairs. EXAM: DG HIP (WITH OR WITHOUT PELVIS) 2-3V LEFT COMPARISON:  None.  FINDINGS: Comminuted fracture of the right pubic body and superior pubic ramus. Normal appearing hips without fracture or dislocation. Diffuse osteopenia. Lower lumbar spine degenerative changes and mild scoliosis. IMPRESSION: 1. Comminuted right pubic body and superior pubic ramus fracture.  2. No hip fracture or dislocation. 3. Lower lumbar spine degenerative changes and mild scoliosis. Electronically Signed   By: Claudie Revering M.D.   On: 04/18/2016 09:46   ASSESSMENT AND PLAN:  Krista Evans  is a 71 y.o. female with a known history of Right lung cancer status post partial lobectomy, celiac disease on gluten-free diet who is fairly active at baseline presents to hospital after a fall and noted to have pelvic fracture.  #1 pelvic fracture-secondary to fall, x-rays with right pubic body communited fracture and superior pubic from his fracture. -Pain control, weight bearing as tolerated. -Physical therapy consulted recommends Rehab -Continue calcium and vitamin D supplements.  #2. Celiac Disease-stable. On a gluten-free diet.  #3 DVT prophylaxis-Lovenox   CSW for d/c planning. Pt to rehab once bed available  Case discussed with Care Management/Social Worker. Management plans discussed with the patient, family and they are in agreement.  CODE STATUS: full  DVT Prophylaxis: lovenox  TOTAL TIME TAKING CARE OF THIS PATIENT: 30 minutes.  >50% time spent on counselling and coordination of care  POSSIBLE D/C IN 1 DAYS, DEPENDING ON CLINICAL CONDITION.  Note: This dictation was prepared with Dragon dictation along with smaller phrase technology. Any transcriptional errors that result from this process are unintentional.  Cutter Passey M.D on 04/19/2016 at 11:59 AM  Between 7am to 6pm - Pager - [phone]  After 6pm go to [email or web address] - password [email or web address] [facility]  Office  [phone]  CC: Primary care physician; Adin Hector, MD

## 2016-04-19 NOTE — Clinical Social Work Placement (Signed)
   CLINICAL SOCIAL WORK PLACEMENT  NOTE  Date:  04/19/2016  Patient Details  Name: Krista Evans MRN: [id number] Date of Birth: 1945/04/15  Clinical Social Work is seeking post-discharge placement for this patient at the [place] level of care (*CSW will initial, date and re-position this form in  chart as items are completed):  Yes   Patient/family provided with [place] Work Department's list of facilities offering this level of care within the geographic area requested by the patient (or if unable, by the patient's family).  Yes   Patient/family informed of their freedom to choose among providers that offer the needed level of care, that participate in Medicare, Medicaid or managed care program needed by the patient, have an available bed and are willing to accept the patient.  Yes   Patient/family informed of [facility]'s ownership interest in [facility] and [facility], as well as of the fact that they are under no obligation to receive care at these facilities.  PASRR submitted to EDS on 04/19/16     PASRR number received on 04/19/16     Existing PASRR number confirmed on       FL2 transmitted to all facilities in geographic area requested by pt/family on 04/19/16     FL2 transmitted to all facilities within larger geographic area on       Patient informed that his/her managed care company has contracts with or will negotiate with certain facilities, including the following:        Yes   Patient/family informed of bed offers received.  Patient chooses bed at  [facility] )     Physician recommends and patient chooses bed at      Patient to be transferred to  [facility] ) on 04/19/16.  Patient to be transferred to facility by  [facility] )     Patient family notified on 04/19/16 of transfer.  Name of family member notified:   (Patient's son Elta Guadeloupe is aware of D/C today. )     PHYSICIAN       Additional  Comment:    _______________________________________________ Rogerick Baldwin, Veronia Beets, LCSW 04/19/2016, 2:30 PM

## 2016-04-19 NOTE — Progress Notes (Addendum)
Patient being discharged to [facility] via EMS. Iv removed with cath intact. Report called. Waiting for transport at this time. Attempted to wean off oxygen and was unable to. MD aware. Notified son that patient was leaving at this time and he would pick up her walker tomorrow.

## 2016-04-20 ENCOUNTER — Other Ambulatory Visit
Admission: RE | Admit: 2016-04-20 | Discharge: 2016-04-20 | Disposition: A | Discharge status: Home / Self Care | Payer: PPO | Source: Ambulatory Visit | Attending: Internal Medicine | Admitting: Internal Medicine

## 2016-04-20 ENCOUNTER — Other Ambulatory Visit: Payer: Self-pay | Admitting: Internal Medicine

## 2016-04-20 ENCOUNTER — Ambulatory Visit
Admission: RE | Admit: 2016-04-20 | Discharge: 2016-04-20 | Disposition: A | Discharge status: Home / Self Care | Payer: PPO | Source: Ambulatory Visit | Attending: Internal Medicine | Admitting: Internal Medicine

## 2016-04-20 DIAGNOSIS — R0902 Hypoxemia: Secondary | ICD-10-CM | POA: Insufficient documentation

## 2016-04-20 DIAGNOSIS — Z85118 Personal history of other malignant neoplasm of bronchus and lung: Secondary | ICD-10-CM | POA: Diagnosis not present

## 2016-04-20 DIAGNOSIS — M810 Age-related osteoporosis without current pathological fracture: Secondary | ICD-10-CM | POA: Diagnosis not present

## 2016-04-20 DIAGNOSIS — Z902 Acquired absence of lung [part of]: Secondary | ICD-10-CM | POA: Insufficient documentation

## 2016-04-20 DIAGNOSIS — Z7401 Bed confinement status: Secondary | ICD-10-CM | POA: Diagnosis not present

## 2016-04-20 DIAGNOSIS — I2510 Atherosclerotic heart disease of native coronary artery without angina pectoris: Secondary | ICD-10-CM | POA: Diagnosis not present

## 2016-04-20 DIAGNOSIS — I700 Atherosclerosis of aorta: Secondary | ICD-10-CM | POA: Insufficient documentation

## 2016-04-20 DIAGNOSIS — J438 Other emphysema: Secondary | ICD-10-CM | POA: Diagnosis not present

## 2016-04-20 DIAGNOSIS — R5082 Postprocedural fever: Secondary | ICD-10-CM | POA: Diagnosis not present

## 2016-04-20 DIAGNOSIS — C801 Malignant (primary) neoplasm, unspecified: Secondary | ICD-10-CM | POA: Diagnosis not present

## 2016-04-20 LAB — URINALYSIS, COMPLETE (UACMP) WITH MICROSCOPIC
Bilirubin Urine: NEGATIVE
Glucose, UA: NEGATIVE mg/dL
Hgb urine dipstick: NEGATIVE
Ketones, ur: 5 mg/dL — AB
Nitrite: NEGATIVE
PROTEIN: NEGATIVE mg/dL
Specific Gravity, Urine: 1.021 (ref 1.005–1.030)
pH: 5 (ref 5.0–8.0)

## 2016-04-20 MED ORDER — IOPAMIDOL (ISOVUE-370) INJECTION 76%
75.0000 mL | Freq: Once | INTRAVENOUS | Status: AC | PRN
  Administered 2016-04-20: 75 mL via INTRAVENOUS

## 2016-04-23 LAB — URINE CULTURE

## 2016-04-25 ENCOUNTER — Non-Acute Institutional Stay (SKILLED_NURSING_FACILITY): Payer: PPO | Admitting: Gerontology

## 2016-04-25 DIAGNOSIS — S329XXD Fracture of unspecified parts of lumbosacral spine and pelvis, subsequent encounter for fracture with routine healing: Secondary | ICD-10-CM

## 2016-04-25 DIAGNOSIS — S32501A Unspecified fracture of right pubis, initial encounter for closed fracture: Secondary | ICD-10-CM | POA: Diagnosis not present

## 2016-05-03 NOTE — Progress Notes (Signed)
Location:      Place of Service:  SNF (31) Provider:  Toni Arthurs, NP-C  BERT Odetta Pink, MD  Patient Care Team: Adin Hector, MD as PCP - General (Internal Medicine)  Extended Emergency Contact Information Primary Emergency Contact: Grigoryan,Larry B Address: [address]          [place], [place] [address] [place] of [place] Phone: [phone] Relation: Spouse Secondary Emergency Contact: Name,Mark  [place] Phone: [phone] Relation: Son  Code Status:  Full Goals of care: Advanced Directive information Advanced Directives 04/18/2016  Does Patient Have a Medical Advance Directive? No  Would patient like information on creating a medical advance directive? No - Patient declined     Chief Complaint  Patient presents with  . Follow-up    HPI:  Pt is a 71 y.o. female seen today for follow up following admission to the facility for rehab. Pt was hospitalized for a pelvic fracture resulting from a fall. Pt has been participating in PT and OT. She reports her pain is controlled. She is mobile in the wheelchair right now, but therapy will start working towards increased walking mobility soon. Pt reports her appetite is good. She is voiding well and having regular BMs. She is on an abt for UTI currently, without any adverse effects. No edema. She remains on O2 2L [place]. Will start to wean this as tolerated as she is not on oxygen at home. Pt denies n,v,d,f,c,cp,sob, ha, abd pain, dizziness, cough. VSS. No other complaints.       Past Medical History:  Diagnosis Date  . Cancer [facility]) 05/2011   Right upper Lung CA with partial Lobectomy.  . Celiac disease    Past Surgical History:  Procedure Laterality Date  . LUNG LOBECTOMY     right lung    Allergies  Allergen Reactions  . Barley Grass Other (See Comments)    And rye Doesn't absorb in small intestines  . Wheat Bran Other (See Comments)    Doesn't absorb in small intestines    Allergies  as of 04/25/2016      Reactions   Barley Grass Other (See Comments)   And rye Doesn't absorb in small intestines   Wheat Bran Other (See Comments)   Doesn't absorb in small intestines      Medication List       Accurate as of 04/25/16 11:59 PM. Always use your most recent med list.          calcium carbonate 600 MG Tabs tablet Commonly known as:  OS-CAL Take by mouth.   Cholecalciferol 1000 units tablet Take 2,000 Units by mouth.   clobetasol cream 0.05 % Commonly known as:  TEMOVATE   MULTI-VITAMINS Tabs Take by mouth.   oxyCODONE-acetaminophen 5-325 MG tablet Commonly known as:  PERCOCET/ROXICET Take 1-2 tablets by mouth every 6 (six) hours as needed for moderate pain or severe pain.       Review of Systems  Constitutional: Negative for activity change, appetite change, chills, diaphoresis and fever.  HENT: Negative for congestion, sneezing, sore throat, trouble swallowing and voice change.   Respiratory: Negative for apnea, cough, choking, chest tightness, shortness of breath and wheezing.   Cardiovascular: Negative for chest pain, palpitations and leg swelling.  Gastrointestinal: Negative for abdominal distention, abdominal pain, constipation, diarrhea and nausea.  Genitourinary: Negative for difficulty urinating, dysuria, frequency and urgency.  Musculoskeletal: Positive for arthralgias (typical arthritis). Negative for back pain, gait problem and  myalgias.  Skin: Negative for color change, pallor, rash and wound.  Neurological: Positive for weakness. Negative for dizziness, tremors, syncope, speech difficulty, numbness and headaches.  Psychiatric/Behavioral: Negative for agitation and behavioral problems.  All other systems reviewed and are negative.    There is no immunization history on file for this patient. Pertinent  Health Maintenance Due  Topic Date Due  . COLONOSCOPY  04/04/1993  . DEXA SCAN  04/04/2008  . PNA vac Low Risk Adult (1 of 2 - PCV13)  04/04/2008  . INFLUENZA VACCINE  08/16/2016  . MAMMOGRAM  02/24/2017   No flowsheet data found. Functional Status Survey:    Vitals:   04/25/16 2000  BP: (!) 148/65  Pulse: 85  Resp: 20  Temp: 98.5 F (36.9 C)  SpO2: 98%   There is no height or weight on file to calculate BMI. Physical Exam  Constitutional: She is oriented to person, place, and time. Vital signs are normal. She appears well-developed and well-nourished. She is active and cooperative. She does not appear ill. No distress.  HENT:  Head: Normocephalic and atraumatic.  Mouth/Throat: Uvula is midline, oropharynx is clear and moist and mucous membranes are normal. Mucous membranes are not pale, not dry and not cyanotic.  Eyes: Conjunctivae, EOM and lids are normal. Pupils are equal, round, and reactive to light.  Neck: Trachea normal, normal range of motion and full passive range of motion without pain. Neck supple. No JVD present. No tracheal deviation, no edema and no erythema present. No thyromegaly present.  Cardiovascular: Normal rate, regular rhythm, normal heart sounds, intact distal pulses and normal pulses.  Exam reveals no gallop, no distant heart sounds and no friction rub.   No murmur heard. Pulmonary/Chest: Effort normal. No accessory muscle usage. No respiratory distress. She has decreased breath sounds (h/o lung cancer with lobectomy) in the right upper field. She has no wheezes. She has no rales. She exhibits no tenderness.  Abdominal: Normal appearance and bowel sounds are normal. She exhibits no distension and no ascites. There is no tenderness.  Musculoskeletal: Normal range of motion. She exhibits no edema or tenderness.       Right hip: She exhibits decreased strength (Pelvic fracture) and bony tenderness.  Expected osteoarthritis, stiffness; Calves soft, supple. Negative Homan's sign. No edema  Neurological: She is alert and oriented to person, place, and time. She has normal strength.  Skin: Skin is  warm, dry and intact. She is not diaphoretic. No cyanosis. No pallor. Nails show no clubbing.  Psychiatric: She has a normal mood and affect. Her speech is normal and behavior is normal. Judgment and thought content normal. Cognition and memory are normal.  Nursing note and vitals reviewed.   Labs reviewed:  Recent Labs  08/17/15 1451 04/18/16 1315 04/19/16 0521  NA 135 129* 135  K 3.5 3.3* 3.8  CL 104 101 105  CO2 24 20* 25  GLUCOSE 124* 132* 99  BUN 13 21* 20  CREATININE 0.94 0.91 1.15*  CALCIUM 9.4 8.3* 8.8*    Recent Labs  08/17/15 1451  AST 27  ALT 15  ALKPHOS 76  BILITOT 0.7  PROT 8.5*  ALBUMIN 4.2    Recent Labs  08/17/15 1451 04/18/16 1315 04/19/16 0521  WBC 7.3 13.9* 7.3  NEUTROABS 4.9  --   --   HGB 12.0 11.1* 10.5*  HCT 34.9* 32.9* 30.8*  MCV 91.3 90.0 91.6  PLT 233 215 181   Lab Results  Component Value Date  TSH 3.247 04/18/2016   No results found for: HGBA1C No results found for: CHOL, HDL, LDLCALC, LDLDIRECT, TRIG, CHOLHDL  Significant Diagnostic Results in last 30 days:  Dg Sacrum/coccyx  Result Date: 04/18/2016 CLINICAL DATA:  Left hip pain after falling down stairs. EXAM: SACRUM AND COCCYX - 2+ VIEW COMPARISON:  Pelvis and left hip radiographs obtained at the same time. FINDINGS: Again demonstrated is a comminuted fracture of the right pubic body and superior pubic ramus. The sacroiliac joints have normal appearances. Unremarkable hips. Lower lumbar spine degenerative changes and mild scoliosis. Abdominal aortic calcifications. IMPRESSION: 1. Comminuted right pubic body and superior pubic ramus fracture. 2. Normal appearing sacroiliac joints. 3. Aortic atherosclerosis. Electronically Signed   By: Claudie Revering M.D.   On: 04/18/2016 09:47   Ct Angio Chest Pe W Or Wo Contrast  Result Date: 04/20/2016 CLINICAL DATA:  Hypoxia today. EXAM: CT ANGIOGRAPHY CHEST WITH CONTRAST TECHNIQUE: Multidetector CT imaging of the chest was performed using the  standard protocol during bolus administration of intravenous contrast. Multiplanar CT image reconstructions and MIPs were obtained to evaluate the vascular anatomy. CONTRAST:  75 cc Isovue 370. COMPARISON:  CT chest 08/09/2015 and 06/22/2014. FINDINGS: Cardiovascular: No pulmonary embolus is identified. Heart size is mildly enlarged. No pericardial effusion. Calcific aortic and coronary atherosclerosis is identified. Mediastinum/Nodes: Small hiatal hernia is seen. No lymphadenopathy. Thyroid gland is unremarkable. Lungs/Pleura: No pleural effusion. There is volume loss in the right chest consistent with history of prior right upper lobectomy. Pleural calcifications along the posterior aspect of the right upper chest are unchanged and likely related to prior treatment. The lungs are markedly emphysematous. Mild dependent atelectasis is noted. No nodule, mass or consolidative process. Upper Abdomen: No acute abnormality. Musculoskeletal: No fracture worrisome lesion is identified. Review of the MIP images confirms the above findings. IMPRESSION: Negative for pulmonary embolus or acute disease. Emphysema. Status post right upper lobectomy.  Appearance is unchanged. Calcific aortic and coronary atherosclerosis. Electronically Signed   By: Inge Rise M.D.   On: 04/20/2016 12:24   Dg Hip Unilat W Or Wo Pelvis 2-3 Views Left  Result Date: 04/18/2016 CLINICAL DATA:  Left hip pain after falling down stairs. EXAM: DG HIP (WITH OR WITHOUT PELVIS) 2-3V LEFT COMPARISON:  None. FINDINGS: Comminuted fracture of the right pubic body and superior pubic ramus. Normal appearing hips without fracture or dislocation. Diffuse osteopenia. Lower lumbar spine degenerative changes and mild scoliosis. IMPRESSION: 1. Comminuted right pubic body and superior pubic ramus fracture. 2. No hip fracture or dislocation. 3. Lower lumbar spine degenerative changes and mild scoliosis. Electronically Signed   By: Claudie Revering M.D.   On:  04/18/2016 09:46    Assessment/Plan 1. Closed nondisplaced fracture of pelvis with routine healing, unspecified part of pelvis, subsequent encounter  Continue working with PT/OT  OOB with assistance  Pain control for increased mobility  Hydrocodone/APAP 5/325 mg 1-2 tablets PO Q 4 hours prn   Family/ staff Communication:   Total Time:  Documentation:  Face to Face:  Family/Phone:   Labs/tests ordered:    Medication list reviewed and assessed for continued appropriateness. Monthly medication orders reviewed and signed.  Vikki Ports, NP-C Geriatrics [facility] [phone] [address], [place] [address] Cell Phone (Mon-Fri 8am-5pm):  [phone] On Call:  [phone] & follow prompts after 5pm & weekends Office Phone:  [phone] Office Fax:  [phone]

## 2016-05-05 ENCOUNTER — Non-Acute Institutional Stay (SKILLED_NURSING_FACILITY): Payer: PPO | Admitting: Gerontology

## 2016-05-05 DIAGNOSIS — S329XXD Fracture of unspecified parts of lumbosacral spine and pelvis, subsequent encounter for fracture with routine healing: Secondary | ICD-10-CM

## 2016-05-11 ENCOUNTER — Non-Acute Institutional Stay (SKILLED_NURSING_FACILITY): Payer: PPO | Admitting: Gerontology

## 2016-05-11 DIAGNOSIS — S329XXD Fracture of unspecified parts of lumbosacral spine and pelvis, subsequent encounter for fracture with routine healing: Secondary | ICD-10-CM | POA: Diagnosis not present

## 2016-05-11 NOTE — Progress Notes (Signed)
Location:      Place of Service:  SNF (31) Provider:  Toni Arthurs, NP-C  BERT Odetta Pink, MD  Patient Care Team: Adin Hector, MD as PCP - General (Internal Medicine)  Extended Emergency Contact Information Primary Emergency Contact: Mikkelsen,Larry B Address: [address]          [place], [place] [address] Johnnette Litter of [place] Phone: [phone] Relation: Spouse Secondary Emergency Contact: Janney,Mark  [place] Phone: [phone] Relation: Son  Code Status:  Full Goals of care: Advanced Directive information Advanced Directives 04/18/2016  Does Patient Have a Medical Advance Directive? No  Would patient like information on creating a medical advance directive? No - Patient declined     Chief Complaint  Patient presents with  . Discharge Note    HPI:  Pt is a 71 y.o. female seen today for discharge evaluation following admission to the facility for rehab. Pt was hospitalized for a pelvic fracture resulting from a fall. Pt has been participating in PT and OT. She reports her pain is controlled. She is mobile in the wheelchair and with a rolling walker. Pt reports her appetite is good. She is voiding well and having regular BMs. She has completed the course of abts for UTI without any adverse effects. No edema. Pt denies n,v,d,f,c,cp,sob, ha, abd pain, dizziness, cough. Pt reports she is nervous about going home but does not voice any specific concerns. I emphasized HH PT/OT would be following her and could address any home health needs that may arise after discharge. Pt was very appreciative of the care received. VSS. No other complaints.       Past Medical History:  Diagnosis Date  . 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  . Celiac disease    Past Surgical History:  Procedure Laterality Date  . LUNG LOBECTOMY     right lung    Allergies  Allergen Reactions  . Barley Grass Other (See Comments)    And  rye Doesn't absorb in small intestines  . Wheat Bran Other (See Comments)    Doesn't absorb in small intestines    Allergies as of 05/11/2016      Reactions   Barley Grass Other (See Comments)   And rye Doesn't absorb in small intestines   Wheat Bran Other (See Comments)   Doesn't absorb in small intestines      Medication List       Accurate as of 05/11/16  2:24 PM. Always use your most recent med list.          calcium carbonate 600 MG Tabs tablet Commonly known as:  OS-CAL Take by mouth.   Cholecalciferol 1000 units tablet Take 2,000 Units by mouth.   clobetasol cream 0.05 % Commonly known as:  TEMOVATE   MULTI-VITAMINS Tabs Take by mouth.   oxyCODONE-acetaminophen 5-325 MG tablet Commonly known as:  PERCOCET/ROXICET Take 1-2 tablets by mouth every 6 (six) hours as needed for moderate pain or severe pain.       Review of Systems  Constitutional: Negative for activity change, appetite change, chills, diaphoresis and fever.  HENT: Negative for congestion, sneezing, sore throat, trouble swallowing and voice change.   Respiratory: Negative for apnea, cough, choking, chest tightness, shortness of breath and wheezing.   Cardiovascular: Negative for chest pain, palpitations and leg swelling.  Gastrointestinal: Negative for abdominal distention, abdominal pain, constipation, diarrhea and nausea.  Genitourinary: Negative for difficulty urinating,  dysuria, frequency and urgency.  Musculoskeletal: Positive for arthralgias (typical arthritis). Negative for back pain, gait problem and myalgias.  Skin: Negative for color change, pallor, rash and wound.  Neurological: Positive for weakness. Negative for dizziness, tremors, syncope, speech difficulty, numbness and headaches.  Psychiatric/Behavioral: Negative for agitation and behavioral problems.  All other systems reviewed and are negative.    There is no immunization history on file for this patient. Pertinent  Health  Maintenance Due  Topic Date Due  . COLONOSCOPY  04/04/1993  . DEXA SCAN  04/04/2008  . PNA vac Low Risk Adult (1 of 2 - PCV13) 04/04/2008  . INFLUENZA VACCINE  08/16/2016  . MAMMOGRAM  02/24/2017   No flowsheet data found. Functional Status Survey:    Vitals:   05/10/16 2000  BP: 128/67  Pulse: 77  Resp: 19  Temp: 98.2 F (36.8 C)  SpO2: 98%   There is no height or weight on file to calculate BMI. Physical Exam  Constitutional: She is oriented to person, place, and time. Vital signs are normal. She appears well-developed and well-nourished. She is active and cooperative. She does not appear ill. No distress.  HENT:  Head: Normocephalic and atraumatic.  Mouth/Throat: Uvula is midline, oropharynx is clear and moist and mucous membranes are normal. Mucous membranes are not pale, not dry and not cyanotic.  Eyes: Conjunctivae, EOM and lids are normal. Pupils are equal, round, and reactive to light.  Neck: Trachea normal, normal range of motion and full passive range of motion without pain. Neck supple. No JVD present. No tracheal deviation, no edema and no erythema present. No thyromegaly present.  Cardiovascular: Normal rate, regular rhythm, normal heart sounds, intact distal pulses and normal pulses.  Exam reveals no gallop, no distant heart sounds and no friction rub.   No murmur heard. Pulmonary/Chest: Effort normal. No accessory muscle usage. No respiratory distress. She has decreased breath sounds (h/o lung cancer with lobectomy) in the right upper field. She has no wheezes. She has no rales. She exhibits no tenderness.  Abdominal: Normal appearance and bowel sounds are normal. She exhibits no distension and no ascites. There is no tenderness.  Musculoskeletal: Normal range of motion. She exhibits no edema or tenderness.       Right hip: She exhibits decreased strength (Pelvic fracture) and bony tenderness.  Expected osteoarthritis, stiffness; Calves soft, supple. Negative Homan's  sign. No edema  Neurological: She is alert and oriented to person, place, and time. She has normal strength.  Skin: Skin is warm, dry and intact. She is not diaphoretic. No cyanosis. No pallor. Nails show no clubbing.  Psychiatric: She has a normal mood and affect. Her speech is normal and behavior is normal. Judgment and thought content normal. Cognition and memory are normal.  Nursing note and vitals reviewed.   Labs reviewed:  Recent Labs  08/17/15 1451 04/18/16 1315 04/19/16 0521  NA 135 129* 135  K 3.5 3.3* 3.8  CL 104 101 105  CO2 24 20* 25  GLUCOSE 124* 132* 99  BUN 13 21* 20  CREATININE 0.94 0.91 1.15*  CALCIUM 9.4 8.3* 8.8*    Recent Labs  08/17/15 1451  AST 27  ALT 15  ALKPHOS 76  BILITOT 0.7  PROT 8.5*  ALBUMIN 4.2    Recent Labs  08/17/15 1451 04/18/16 1315 04/19/16 0521  WBC 7.3 13.9* 7.3  NEUTROABS 4.9  --   --   HGB 12.0 11.1* 10.5*  HCT 34.9* 32.9* 30.8*  MCV 91.3  90.0 91.6  PLT 233 215 181   Lab Results  Component Value Date   TSH 3.247 04/18/2016   No results found for: HGBA1C No results found for: CHOL, HDL, LDLCALC, LDLDIRECT, TRIG, CHOLHDL  Significant Diagnostic Results in last 30 days:  Dg Sacrum/coccyx  Result Date: 04/18/2016 CLINICAL DATA:  Left hip pain after falling down stairs. EXAM: SACRUM AND COCCYX - 2+ VIEW COMPARISON:  Pelvis and left hip radiographs obtained at the same time. FINDINGS: Again demonstrated is a comminuted fracture of the right pubic body and superior pubic ramus. The sacroiliac joints have normal appearances. Unremarkable hips. Lower lumbar spine degenerative changes and mild scoliosis. Abdominal aortic calcifications. IMPRESSION: 1. Comminuted right pubic body and superior pubic ramus fracture. 2. Normal appearing sacroiliac joints. 3. Aortic atherosclerosis. Electronically Signed   By: Claudie Revering M.D.   On: 04/18/2016 09:47   Ct Angio Chest Pe W Or Wo Contrast  Result Date: 04/20/2016 CLINICAL DATA:   Hypoxia today. EXAM: CT ANGIOGRAPHY CHEST WITH CONTRAST TECHNIQUE: Multidetector CT imaging of the chest was performed using the standard protocol during bolus administration of intravenous contrast. Multiplanar CT image reconstructions and MIPs were obtained to evaluate the vascular anatomy. CONTRAST:  75 cc Isovue 370. COMPARISON:  CT chest 08/09/2015 and 06/22/2014. FINDINGS: Cardiovascular: No pulmonary embolus is identified. Heart size is mildly enlarged. No pericardial effusion. Calcific aortic and coronary atherosclerosis is identified. Mediastinum/Nodes: Small hiatal hernia is seen. No lymphadenopathy. Thyroid gland is unremarkable. Lungs/Pleura: No pleural effusion. There is volume loss in the right chest consistent with history of prior right upper lobectomy. Pleural calcifications along the posterior aspect of the right upper chest are unchanged and likely related to prior treatment. The lungs are markedly emphysematous. Mild dependent atelectasis is noted. No nodule, mass or consolidative process. Upper Abdomen: No acute abnormality. Musculoskeletal: No fracture worrisome lesion is identified. Review of the MIP images confirms the above findings. IMPRESSION: Negative for pulmonary embolus or acute disease. Emphysema. Status post right upper lobectomy.  Appearance is unchanged. Calcific aortic and coronary atherosclerosis. Electronically Signed   By: Inge Rise M.D.   On: 04/20/2016 12:24   Dg Hip Unilat W Or Wo Pelvis 2-3 Views Left  Result Date: 04/18/2016 CLINICAL DATA:  Left hip pain after falling down stairs. EXAM: DG HIP (WITH OR WITHOUT PELVIS) 2-3V LEFT COMPARISON:  None. FINDINGS: Comminuted fracture of the right pubic body and superior pubic ramus. Normal appearing hips without fracture or dislocation. Diffuse osteopenia. Lower lumbar spine degenerative changes and mild scoliosis. IMPRESSION: 1. Comminuted right pubic body and superior pubic ramus fracture. 2. No hip fracture or  dislocation. 3. Lower lumbar spine degenerative changes and mild scoliosis. Electronically Signed   By: Claudie Revering M.D.   On: 04/18/2016 09:46    Assessment/Plan 1. Closed nondisplaced fracture of pelvis with routine healing, unspecified part of pelvis, subsequent encounter  Continue working with PT/OT  OOB with assistance  Pain control for increased mobility  Hydrocodone/APAP 5/325 mg 1-2 tablets PO Q 4 hours prn #25, no refills  Follow up with orthopedist asap after discharge for continuity of care   Family/ staff Communication:   Total Time:  Documentation:  Face to Face:  Family/Phone:   Labs/tests ordered:    Patient is being discharged with the following home health services:  HHPT/OT  Patient is being discharged with the following durable medical equipment:  walker  Patient has been advised to f/u with their PCP in 1-2 weeks to bring them  up to date on their rehab stay.  Social services at facility was responsible for arranging this appointment.  Pt was provided with a 30 day supply of prescriptions for medications and refills must be obtained from their PCP.  For controlled substances, a more limited supply may be provided adequate until PCP appointment only.  Medication list reviewed and assessed for continued appropriateness. Monthly medication orders reviewed and signed.  Vikki Ports, NP-C [facility] [phone] [address], [place] [address] Cell Phone (Mon-Fri 8am-5pm):  [phone] On Call:  [phone] & follow prompts after 5pm & weekends Office Phone:  [phone] Office Fax:  [phone]

## 2016-05-11 NOTE — Progress Notes (Signed)
Location:      Place of Service:  SNF (31) Provider:  Toni Arthurs, NP-C  BERT Odetta Pink, MD  Patient Care Team: Adin Hector, MD as PCP - General (Internal Medicine)  Extended Emergency Contact Information Primary Emergency Contact: Casimir,Larry B Address: [address]          [place], [place] [address] Johnnette Litter of [place] Phone: [phone] Relation: Spouse Secondary Emergency Contact: Murakami,Mark  [place] Phone: [phone] Relation: Son  Code Status:  Full Goals of care: Advanced Directive information Advanced Directives 04/18/2016  Does Patient Have a Medical Advance Directive? No  Would patient like information on creating a medical advance directive? No - Patient declined     Chief Complaint  Patient presents with  . Follow-up    HPI:  Pt is a 71 y.o. female seen today for follow up following admission to the facility for rehab. Pt was hospitalized for a pelvic fracture resulting from a fall. Pt has been participating in PT and OT. She reports her pain is controlled. She is mobile in the wheelchair right now, but therapy has been working towards increased walking mobility. Pt reports her appetite is good. She is voiding well and having regular BMs. She has completed the course of abts for UTI without any adverse effects. No edema. Pt denies n,v,d,f,c,cp,sob, ha, abd pain, dizziness, cough. VSS. No other complaints.       Past Medical History:  Diagnosis Date  . Cancer [facility]) 05/2011   Right upper Lung CA with partial Lobectomy.  . Celiac disease    Past Surgical History:  Procedure Laterality Date  . LUNG LOBECTOMY     right lung    Allergies  Allergen Reactions  . Barley Grass Other (See Comments)    And rye Doesn't absorb in small intestines  . Wheat Bran Other (See Comments)    Doesn't absorb in small intestines    Allergies as of 05/05/2016      Reactions   Barley Grass Other (See Comments)   And rye Doesn't  absorb in small intestines   Wheat Bran Other (See Comments)   Doesn't absorb in small intestines      Medication List       Accurate as of 05/05/16 11:59 PM. Always use your most recent med list.          calcium carbonate 600 MG Tabs tablet Commonly known as:  OS-CAL Take by mouth.   Cholecalciferol 1000 units tablet Take 2,000 Units by mouth.   clobetasol cream 0.05 % Commonly known as:  TEMOVATE   MULTI-VITAMINS Tabs Take by mouth.   oxyCODONE-acetaminophen 5-325 MG tablet Commonly known as:  PERCOCET/ROXICET Take 1-2 tablets by mouth every 6 (six) hours as needed for moderate pain or severe pain.       Review of Systems  Constitutional: Negative for activity change, appetite change, chills, diaphoresis and fever.  HENT: Negative for congestion, sneezing, sore throat, trouble swallowing and voice change.   Respiratory: Negative for apnea, cough, choking, chest tightness, shortness of breath and wheezing.   Cardiovascular: Negative for chest pain, palpitations and leg swelling.  Gastrointestinal: Negative for abdominal distention, abdominal pain, constipation, diarrhea and nausea.  Genitourinary: Negative for difficulty urinating, dysuria, frequency and urgency.  Musculoskeletal: Positive for arthralgias (typical arthritis). Negative for back pain, gait problem and myalgias.  Skin: Negative for color change, pallor, rash and wound.  Neurological: Positive for weakness. Negative for dizziness, tremors, syncope,  speech difficulty, numbness and headaches.  Psychiatric/Behavioral: Negative for agitation and behavioral problems.  All other systems reviewed and are negative.    There is no immunization history on file for this patient. Pertinent  Health Maintenance Due  Topic Date Due  . COLONOSCOPY  04/04/1993  . DEXA SCAN  04/04/2008  . PNA vac Low Risk Adult (1 of 2 - PCV13) 04/04/2008  . INFLUENZA VACCINE  08/16/2016  . MAMMOGRAM  02/24/2017   No flowsheet  data found. Functional Status Survey:    Vitals:   05/05/16 2020  BP: (!) 145/59  Pulse: 83  Resp: 18  Temp: 98.1 F (36.7 C)  SpO2: 94%   There is no height or weight on file to calculate BMI. Physical Exam  Constitutional: She is oriented to person, place, and time. Vital signs are normal. She appears well-developed and well-nourished. She is active and cooperative. She does not appear ill. No distress.  HENT:  Head: Normocephalic and atraumatic.  Mouth/Throat: Uvula is midline, oropharynx is clear and moist and mucous membranes are normal. Mucous membranes are not pale, not dry and not cyanotic.  Eyes: Conjunctivae, EOM and lids are normal. Pupils are equal, round, and reactive to light.  Neck: Trachea normal, normal range of motion and full passive range of motion without pain. Neck supple. No JVD present. No tracheal deviation, no edema and no erythema present. No thyromegaly present.  Cardiovascular: Normal rate, regular rhythm, normal heart sounds, intact distal pulses and normal pulses.  Exam reveals no gallop, no distant heart sounds and no friction rub.   No murmur heard. Pulmonary/Chest: Effort normal. No accessory muscle usage. No respiratory distress. She has decreased breath sounds (h/o lung cancer with lobectomy) in the right upper field. She has no wheezes. She has no rales. She exhibits no tenderness.  Abdominal: Normal appearance and bowel sounds are normal. She exhibits no distension and no ascites. There is no tenderness.  Musculoskeletal: Normal range of motion. She exhibits no edema or tenderness.       Right hip: She exhibits decreased strength (Pelvic fracture) and bony tenderness.  Expected osteoarthritis, stiffness; Calves soft, supple. Negative Homan's sign. No edema  Neurological: She is alert and oriented to person, place, and time. She has normal strength.  Skin: Skin is warm, dry and intact. She is not diaphoretic. No cyanosis. No pallor. Nails show no  clubbing.  Psychiatric: She has a normal mood and affect. Her speech is normal and behavior is normal. Judgment and thought content normal. Cognition and memory are normal.  Nursing note and vitals reviewed.   Labs reviewed:  Recent Labs  08/17/15 1451 04/18/16 1315 04/19/16 0521  NA 135 129* 135  K 3.5 3.3* 3.8  CL 104 101 105  CO2 24 20* 25  GLUCOSE 124* 132* 99  BUN 13 21* 20  CREATININE 0.94 0.91 1.15*  CALCIUM 9.4 8.3* 8.8*    Recent Labs  08/17/15 1451  AST 27  ALT 15  ALKPHOS 76  BILITOT 0.7  PROT 8.5*  ALBUMIN 4.2    Recent Labs  08/17/15 1451 04/18/16 1315 04/19/16 0521  WBC 7.3 13.9* 7.3  NEUTROABS 4.9  --   --   HGB 12.0 11.1* 10.5*  HCT 34.9* 32.9* 30.8*  MCV 91.3 90.0 91.6  PLT 233 215 181   Lab Results  Component Value Date   TSH 3.247 04/18/2016   No results found for: HGBA1C No results found for: CHOL, HDL, LDLCALC, LDLDIRECT, TRIG, CHOLHDL  Significant Diagnostic Results in last 30 days:  Dg Sacrum/coccyx  Result Date: 04/18/2016 CLINICAL DATA:  Left hip pain after falling down stairs. EXAM: SACRUM AND COCCYX - 2+ VIEW COMPARISON:  Pelvis and left hip radiographs obtained at the same time. FINDINGS: Again demonstrated is a comminuted fracture of the right pubic body and superior pubic ramus. The sacroiliac joints have normal appearances. Unremarkable hips. Lower lumbar spine degenerative changes and mild scoliosis. Abdominal aortic calcifications. IMPRESSION: 1. Comminuted right pubic body and superior pubic ramus fracture. 2. Normal appearing sacroiliac joints. 3. Aortic atherosclerosis. Electronically Signed   By: Claudie Revering M.D.   On: 04/18/2016 09:47   Ct Angio Chest Pe W Or Wo Contrast  Result Date: 04/20/2016 CLINICAL DATA:  Hypoxia today. EXAM: CT ANGIOGRAPHY CHEST WITH CONTRAST TECHNIQUE: Multidetector CT imaging of the chest was performed using the standard protocol during bolus administration of intravenous contrast. Multiplanar CT  image reconstructions and MIPs were obtained to evaluate the vascular anatomy. CONTRAST:  75 cc Isovue 370. COMPARISON:  CT chest 08/09/2015 and 06/22/2014. FINDINGS: Cardiovascular: No pulmonary embolus is identified. Heart size is mildly enlarged. No pericardial effusion. Calcific aortic and coronary atherosclerosis is identified. Mediastinum/Nodes: Small hiatal hernia is seen. No lymphadenopathy. Thyroid gland is unremarkable. Lungs/Pleura: No pleural effusion. There is volume loss in the right chest consistent with history of prior right upper lobectomy. Pleural calcifications along the posterior aspect of the right upper chest are unchanged and likely related to prior treatment. The lungs are markedly emphysematous. Mild dependent atelectasis is noted. No nodule, mass or consolidative process. Upper Abdomen: No acute abnormality. Musculoskeletal: No fracture worrisome lesion is identified. Review of the MIP images confirms the above findings. IMPRESSION: Negative for pulmonary embolus or acute disease. Emphysema. Status post right upper lobectomy.  Appearance is unchanged. Calcific aortic and coronary atherosclerosis. Electronically Signed   By: Inge Rise M.D.   On: 04/20/2016 12:24   Dg Hip Unilat W Or Wo Pelvis 2-3 Views Left  Result Date: 04/18/2016 CLINICAL DATA:  Left hip pain after falling down stairs. EXAM: DG HIP (WITH OR WITHOUT PELVIS) 2-3V LEFT COMPARISON:  None. FINDINGS: Comminuted fracture of the right pubic body and superior pubic ramus. Normal appearing hips without fracture or dislocation. Diffuse osteopenia. Lower lumbar spine degenerative changes and mild scoliosis. IMPRESSION: 1. Comminuted right pubic body and superior pubic ramus fracture. 2. No hip fracture or dislocation. 3. Lower lumbar spine degenerative changes and mild scoliosis. Electronically Signed   By: Claudie Revering M.D.   On: 04/18/2016 09:46    Assessment/Plan 1. Closed nondisplaced fracture of pelvis with routine  healing, unspecified part of pelvis, subsequent encounter  Continue working with PT/OT  OOB with assistance  Pain control for increased mobility  Hydrocodone/APAP 5/325 mg 1-2 tablets PO Q 4 hours prn   Family/ staff Communication:   Total Time:  Documentation:  Face to Face:  Family/Phone:   Labs/tests ordered:    Medication list reviewed and assessed for continued appropriateness. Monthly medication orders reviewed and signed.  Vikki Ports, NP-C Geriatrics [facility] [phone], [place] [address] Cell Phone (Mon-Fri 8am-5pm):  [phone] On Call:  [phone] & follow prompts after 5pm & weekends Office Phone:  [phone] Office Fax:  [phone]

## 2016-05-15 DIAGNOSIS — Z902 Acquired absence of lung [part of]: Secondary | ICD-10-CM | POA: Diagnosis not present

## 2016-05-15 DIAGNOSIS — B9620 Unspecified Escherichia coli [E. coli] as the cause of diseases classified elsewhere: Secondary | ICD-10-CM | POA: Diagnosis not present

## 2016-05-15 DIAGNOSIS — N390 Urinary tract infection, site not specified: Secondary | ICD-10-CM | POA: Diagnosis not present

## 2016-05-15 DIAGNOSIS — Z79891 Long term (current) use of opiate analgesic: Secondary | ICD-10-CM | POA: Diagnosis not present

## 2016-05-15 DIAGNOSIS — Z85118 Personal history of other malignant neoplasm of bronchus and lung: Secondary | ICD-10-CM | POA: Diagnosis not present

## 2016-05-15 DIAGNOSIS — Z9181 History of falling: Secondary | ICD-10-CM | POA: Diagnosis not present

## 2016-05-15 DIAGNOSIS — K900 Celiac disease: Secondary | ICD-10-CM | POA: Diagnosis not present

## 2016-05-15 DIAGNOSIS — R296 Repeated falls: Secondary | ICD-10-CM | POA: Diagnosis not present

## 2016-05-15 DIAGNOSIS — Z87891 Personal history of nicotine dependence: Secondary | ICD-10-CM | POA: Diagnosis not present

## 2016-05-15 DIAGNOSIS — M80051D Age-related osteoporosis with current pathological fracture, right femur, subsequent encounter for fracture with routine healing: Secondary | ICD-10-CM | POA: Diagnosis not present

## 2016-05-16 DIAGNOSIS — S32501A Unspecified fracture of right pubis, initial encounter for closed fracture: Secondary | ICD-10-CM | POA: Diagnosis not present

## 2016-05-17 DIAGNOSIS — B9620 Unspecified Escherichia coli [E. coli] as the cause of diseases classified elsewhere: Secondary | ICD-10-CM | POA: Diagnosis not present

## 2016-05-17 DIAGNOSIS — K900 Celiac disease: Secondary | ICD-10-CM | POA: Diagnosis not present

## 2016-05-17 DIAGNOSIS — M80051D Age-related osteoporosis with current pathological fracture, right femur, subsequent encounter for fracture with routine healing: Secondary | ICD-10-CM | POA: Diagnosis not present

## 2016-05-17 DIAGNOSIS — Z87891 Personal history of nicotine dependence: Secondary | ICD-10-CM | POA: Diagnosis not present

## 2016-05-17 DIAGNOSIS — Z85118 Personal history of other malignant neoplasm of bronchus and lung: Secondary | ICD-10-CM | POA: Diagnosis not present

## 2016-05-17 DIAGNOSIS — N390 Urinary tract infection, site not specified: Secondary | ICD-10-CM | POA: Diagnosis not present

## 2016-05-17 DIAGNOSIS — Z9181 History of falling: Secondary | ICD-10-CM | POA: Diagnosis not present

## 2016-05-17 DIAGNOSIS — R296 Repeated falls: Secondary | ICD-10-CM | POA: Diagnosis not present

## 2016-05-17 DIAGNOSIS — Z902 Acquired absence of lung [part of]: Secondary | ICD-10-CM | POA: Diagnosis not present

## 2016-05-17 DIAGNOSIS — Z79891 Long term (current) use of opiate analgesic: Secondary | ICD-10-CM | POA: Diagnosis not present

## 2016-05-23 DIAGNOSIS — Z902 Acquired absence of lung [part of]: Secondary | ICD-10-CM | POA: Diagnosis not present

## 2016-05-23 DIAGNOSIS — M80051D Age-related osteoporosis with current pathological fracture, right femur, subsequent encounter for fracture with routine healing: Secondary | ICD-10-CM | POA: Diagnosis not present

## 2016-05-23 DIAGNOSIS — Z79891 Long term (current) use of opiate analgesic: Secondary | ICD-10-CM | POA: Diagnosis not present

## 2016-05-23 DIAGNOSIS — K900 Celiac disease: Secondary | ICD-10-CM | POA: Diagnosis not present

## 2016-05-23 DIAGNOSIS — Z9181 History of falling: Secondary | ICD-10-CM | POA: Diagnosis not present

## 2016-05-23 DIAGNOSIS — R296 Repeated falls: Secondary | ICD-10-CM | POA: Diagnosis not present

## 2016-05-23 DIAGNOSIS — B9620 Unspecified Escherichia coli [E. coli] as the cause of diseases classified elsewhere: Secondary | ICD-10-CM | POA: Diagnosis not present

## 2016-05-23 DIAGNOSIS — Z85118 Personal history of other malignant neoplasm of bronchus and lung: Secondary | ICD-10-CM | POA: Diagnosis not present

## 2016-05-23 DIAGNOSIS — N390 Urinary tract infection, site not specified: Secondary | ICD-10-CM | POA: Diagnosis not present

## 2016-05-23 DIAGNOSIS — Z87891 Personal history of nicotine dependence: Secondary | ICD-10-CM | POA: Diagnosis not present

## 2016-05-26 DIAGNOSIS — Z79891 Long term (current) use of opiate analgesic: Secondary | ICD-10-CM | POA: Diagnosis not present

## 2016-05-26 DIAGNOSIS — Z902 Acquired absence of lung [part of]: Secondary | ICD-10-CM | POA: Diagnosis not present

## 2016-05-26 DIAGNOSIS — Z9181 History of falling: Secondary | ICD-10-CM | POA: Diagnosis not present

## 2016-05-26 DIAGNOSIS — R296 Repeated falls: Secondary | ICD-10-CM | POA: Diagnosis not present

## 2016-05-26 DIAGNOSIS — B9620 Unspecified Escherichia coli [E. coli] as the cause of diseases classified elsewhere: Secondary | ICD-10-CM | POA: Diagnosis not present

## 2016-05-26 DIAGNOSIS — Z85118 Personal history of other malignant neoplasm of bronchus and lung: Secondary | ICD-10-CM | POA: Diagnosis not present

## 2016-05-26 DIAGNOSIS — K900 Celiac disease: Secondary | ICD-10-CM | POA: Diagnosis not present

## 2016-05-26 DIAGNOSIS — N390 Urinary tract infection, site not specified: Secondary | ICD-10-CM | POA: Diagnosis not present

## 2016-05-26 DIAGNOSIS — Z87891 Personal history of nicotine dependence: Secondary | ICD-10-CM | POA: Diagnosis not present

## 2016-05-26 DIAGNOSIS — M80051D Age-related osteoporosis with current pathological fracture, right femur, subsequent encounter for fracture with routine healing: Secondary | ICD-10-CM | POA: Diagnosis not present

## 2016-05-30 DIAGNOSIS — N390 Urinary tract infection, site not specified: Secondary | ICD-10-CM | POA: Diagnosis not present

## 2016-05-30 DIAGNOSIS — Z902 Acquired absence of lung [part of]: Secondary | ICD-10-CM | POA: Diagnosis not present

## 2016-05-30 DIAGNOSIS — Z79891 Long term (current) use of opiate analgesic: Secondary | ICD-10-CM | POA: Diagnosis not present

## 2016-05-30 DIAGNOSIS — K900 Celiac disease: Secondary | ICD-10-CM | POA: Diagnosis not present

## 2016-05-30 DIAGNOSIS — Z87891 Personal history of nicotine dependence: Secondary | ICD-10-CM | POA: Diagnosis not present

## 2016-05-30 DIAGNOSIS — Z9181 History of falling: Secondary | ICD-10-CM | POA: Diagnosis not present

## 2016-05-30 DIAGNOSIS — R296 Repeated falls: Secondary | ICD-10-CM | POA: Diagnosis not present

## 2016-05-30 DIAGNOSIS — Z85118 Personal history of other malignant neoplasm of bronchus and lung: Secondary | ICD-10-CM | POA: Diagnosis not present

## 2016-05-30 DIAGNOSIS — M80051D Age-related osteoporosis with current pathological fracture, right femur, subsequent encounter for fracture with routine healing: Secondary | ICD-10-CM | POA: Diagnosis not present

## 2016-05-30 DIAGNOSIS — B9620 Unspecified Escherichia coli [E. coli] as the cause of diseases classified elsewhere: Secondary | ICD-10-CM | POA: Diagnosis not present

## 2016-05-31 DIAGNOSIS — E784 Other hyperlipidemia: Secondary | ICD-10-CM | POA: Diagnosis not present

## 2016-05-31 DIAGNOSIS — D6489 Other specified anemias: Secondary | ICD-10-CM | POA: Diagnosis not present

## 2016-05-31 DIAGNOSIS — E559 Vitamin D deficiency, unspecified: Secondary | ICD-10-CM | POA: Diagnosis not present

## 2016-06-01 ENCOUNTER — Other Ambulatory Visit: Payer: Self-pay | Admitting: Internal Medicine

## 2016-06-01 DIAGNOSIS — Z1231 Encounter for screening mammogram for malignant neoplasm of breast: Secondary | ICD-10-CM

## 2016-06-01 DIAGNOSIS — I700 Atherosclerosis of aorta: Secondary | ICD-10-CM | POA: Diagnosis not present

## 2016-06-01 DIAGNOSIS — E784 Other hyperlipidemia: Secondary | ICD-10-CM | POA: Diagnosis not present

## 2016-06-01 DIAGNOSIS — Z85118 Personal history of other malignant neoplasm of bronchus and lung: Secondary | ICD-10-CM | POA: Diagnosis not present

## 2016-06-01 DIAGNOSIS — Z0000 Encounter for general adult medical examination without abnormal findings: Secondary | ICD-10-CM | POA: Diagnosis not present

## 2016-06-01 DIAGNOSIS — Z8781 Personal history of (healed) traumatic fracture: Secondary | ICD-10-CM | POA: Diagnosis not present

## 2016-06-01 DIAGNOSIS — D649 Anemia, unspecified: Secondary | ICD-10-CM | POA: Diagnosis not present

## 2016-06-01 DIAGNOSIS — M810 Age-related osteoporosis without current pathological fracture: Secondary | ICD-10-CM | POA: Diagnosis not present

## 2016-06-01 DIAGNOSIS — K900 Celiac disease: Secondary | ICD-10-CM | POA: Diagnosis not present

## 2016-06-02 DIAGNOSIS — Z85118 Personal history of other malignant neoplasm of bronchus and lung: Secondary | ICD-10-CM | POA: Diagnosis not present

## 2016-06-02 DIAGNOSIS — M80051D Age-related osteoporosis with current pathological fracture, right femur, subsequent encounter for fracture with routine healing: Secondary | ICD-10-CM | POA: Diagnosis not present

## 2016-06-02 DIAGNOSIS — Z87891 Personal history of nicotine dependence: Secondary | ICD-10-CM | POA: Diagnosis not present

## 2016-06-02 DIAGNOSIS — Z79891 Long term (current) use of opiate analgesic: Secondary | ICD-10-CM | POA: Diagnosis not present

## 2016-06-02 DIAGNOSIS — N390 Urinary tract infection, site not specified: Secondary | ICD-10-CM | POA: Diagnosis not present

## 2016-06-02 DIAGNOSIS — R296 Repeated falls: Secondary | ICD-10-CM | POA: Diagnosis not present

## 2016-06-02 DIAGNOSIS — Z9181 History of falling: Secondary | ICD-10-CM | POA: Diagnosis not present

## 2016-06-02 DIAGNOSIS — K900 Celiac disease: Secondary | ICD-10-CM | POA: Diagnosis not present

## 2016-06-02 DIAGNOSIS — B9620 Unspecified Escherichia coli [E. coli] as the cause of diseases classified elsewhere: Secondary | ICD-10-CM | POA: Diagnosis not present

## 2016-06-02 DIAGNOSIS — Z902 Acquired absence of lung [part of]: Secondary | ICD-10-CM | POA: Diagnosis not present

## 2016-06-06 DIAGNOSIS — Z902 Acquired absence of lung [part of]: Secondary | ICD-10-CM | POA: Diagnosis not present

## 2016-06-06 DIAGNOSIS — R296 Repeated falls: Secondary | ICD-10-CM | POA: Diagnosis not present

## 2016-06-06 DIAGNOSIS — M80051D Age-related osteoporosis with current pathological fracture, right femur, subsequent encounter for fracture with routine healing: Secondary | ICD-10-CM | POA: Diagnosis not present

## 2016-06-06 DIAGNOSIS — B9620 Unspecified Escherichia coli [E. coli] as the cause of diseases classified elsewhere: Secondary | ICD-10-CM | POA: Diagnosis not present

## 2016-06-06 DIAGNOSIS — Z87891 Personal history of nicotine dependence: Secondary | ICD-10-CM | POA: Diagnosis not present

## 2016-06-06 DIAGNOSIS — K900 Celiac disease: Secondary | ICD-10-CM | POA: Diagnosis not present

## 2016-06-06 DIAGNOSIS — N390 Urinary tract infection, site not specified: Secondary | ICD-10-CM | POA: Diagnosis not present

## 2016-06-06 DIAGNOSIS — Z79891 Long term (current) use of opiate analgesic: Secondary | ICD-10-CM | POA: Diagnosis not present

## 2016-06-06 DIAGNOSIS — Z9181 History of falling: Secondary | ICD-10-CM | POA: Diagnosis not present

## 2016-06-06 DIAGNOSIS — Z85118 Personal history of other malignant neoplasm of bronchus and lung: Secondary | ICD-10-CM | POA: Diagnosis not present

## 2016-06-07 ENCOUNTER — Ambulatory Visit
Admission: RE | Admit: 2016-06-07 | Discharge: 2016-06-07 | Disposition: A | Discharge status: Home / Self Care | Payer: PPO | Source: Ambulatory Visit | Attending: Internal Medicine | Admitting: Internal Medicine

## 2016-06-07 DIAGNOSIS — Z1231 Encounter for screening mammogram for malignant neoplasm of breast: Secondary | ICD-10-CM | POA: Insufficient documentation

## 2016-06-13 DIAGNOSIS — M810 Age-related osteoporosis without current pathological fracture: Secondary | ICD-10-CM | POA: Diagnosis not present

## 2016-06-13 DIAGNOSIS — S32501A Unspecified fracture of right pubis, initial encounter for closed fracture: Secondary | ICD-10-CM | POA: Diagnosis not present

## 2016-06-21 DIAGNOSIS — B9620 Unspecified Escherichia coli [E. coli] as the cause of diseases classified elsewhere: Secondary | ICD-10-CM | POA: Diagnosis not present

## 2016-06-21 DIAGNOSIS — M80051D Age-related osteoporosis with current pathological fracture, right femur, subsequent encounter for fracture with routine healing: Secondary | ICD-10-CM | POA: Diagnosis not present

## 2016-06-21 DIAGNOSIS — N390 Urinary tract infection, site not specified: Secondary | ICD-10-CM | POA: Diagnosis not present

## 2016-06-21 DIAGNOSIS — K900 Celiac disease: Secondary | ICD-10-CM | POA: Diagnosis not present

## 2016-07-13 DIAGNOSIS — R102 Pelvic and perineal pain: Secondary | ICD-10-CM | POA: Diagnosis not present

## 2016-07-13 DIAGNOSIS — R2689 Other abnormalities of gait and mobility: Secondary | ICD-10-CM | POA: Diagnosis not present

## 2016-07-21 DIAGNOSIS — R102 Pelvic and perineal pain: Secondary | ICD-10-CM | POA: Diagnosis not present

## 2016-07-21 DIAGNOSIS — R2689 Other abnormalities of gait and mobility: Secondary | ICD-10-CM | POA: Diagnosis not present

## 2016-07-25 DIAGNOSIS — R2689 Other abnormalities of gait and mobility: Secondary | ICD-10-CM | POA: Diagnosis not present

## 2016-07-25 DIAGNOSIS — R102 Pelvic and perineal pain: Secondary | ICD-10-CM | POA: Diagnosis not present

## 2016-08-07 DIAGNOSIS — L280 Lichen simplex chronicus: Secondary | ICD-10-CM | POA: Diagnosis not present

## 2016-08-07 DIAGNOSIS — D225 Melanocytic nevi of trunk: Secondary | ICD-10-CM | POA: Diagnosis not present

## 2016-08-07 DIAGNOSIS — D2261 Melanocytic nevi of right upper limb, including shoulder: Secondary | ICD-10-CM | POA: Diagnosis not present

## 2016-08-07 DIAGNOSIS — Z85828 Personal history of other malignant neoplasm of skin: Secondary | ICD-10-CM | POA: Diagnosis not present

## 2016-08-15 ENCOUNTER — Telehealth: Payer: Self-pay | Admitting: *Deleted

## 2016-08-15 NOTE — Telephone Encounter (Signed)
Patient called to say she did not want to get a CT scan with contrast scheduled for 08/16/2016. She stated that another Dr. Had told her she did not need one. She was expressed concern over the necessity and the contrast. I advised her that she can cancel and discuss with Dr. Maryjane Hurter at her next appointment.

## 2016-08-16 ENCOUNTER — Ambulatory Visit: Payer: PPO

## 2016-08-17 ENCOUNTER — Ambulatory Visit: Payer: PPO | Admitting: Oncology

## 2016-08-21 NOTE — Progress Notes (Signed)
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  INTERVAL HISTORY: Patient returns to clinic today for routine follow-up and discussion of her imaging results from last April when she was in the hospital after a fall and fractured pelvis. She continues to require a cane to walk, but is nearly back to her baseline. She has no neurologic complaints. She denies any recent fevers or illnesses. She has a good appetite and denies weight loss. She denies any chest pain, shortness of breath, cough, or hemoptysis. She denies any nausea, vomiting, constipation, or diarrhea. She has no urinary complaints. Patient offers no further specific complaints today.  REVIEW OF SYSTEMS:   Review of Systems  Constitutional: Negative.  Negative for fever, malaise/fatigue and weight loss.  Respiratory: Negative.  Negative for cough, hemoptysis and shortness of breath.   Cardiovascular: Negative.  Negative for chest pain and leg swelling.  Gastrointestinal: Negative.  Negative for abdominal pain.  Genitourinary: Negative.   Musculoskeletal: Positive for joint pain.  Skin: Negative.  Negative for rash.  Neurological: Negative.  Negative for weakness.  Psychiatric/Behavioral: Negative.  The patient is not nervous/anxious.     As per HPI. Otherwise, a complete review of systems is negative.   PAST MEDICAL HISTORY: Past Medical History:  Diagnosis Date  . Cancer [facility]) 05/2011   Right upper Lung CA with partial Lobectomy.  . Celiac disease     PAST SURGICAL HISTORY: Past Surgical History:  Procedure Laterality Date  . LUNG LOBECTOMY     right lung    FAMILY HISTORY Family History  Problem Relation Age of Onset  . Breast cancer Cousin 61  . CAD Sister      GYNECOLOGIC HISTORY:  No LMP recorded. Patient is postmenopausal.     ADVANCED DIRECTIVES:    HEALTH MAINTENANCE: Social History  Substance Use Topics  . Smoking status: Former Smoker    Types: Cigarettes    Quit date: 02/28/1986  . Smokeless tobacco: Never Used  . Alcohol use No     Allergies  Allergen Reactions  . Barley Grass Other (See Comments)    And rye Doesn't absorb in small intestines  . Gluten Meal   . Wheat Bran Other (See Comments)    Doesn't absorb in small intestines    Current Outpatient Prescriptions  Medication Sig Dispense Refill  . alendronate (FOSAMAX) 70 MG tablet Take 70 mg by mouth once a week.    . calcium carbonate (OS-CAL) 600 MG TABS tablet Take by mouth.    . Cholecalciferol 1000 UNITS tablet Take 2,000 Units by mouth.     . clobetasol cream (TEMOVATE) 0.05 %     . Multiple Vitamin (MULTI-VITAMINS) TABS Take by mouth.    . oxyCODONE-acetaminophen (PERCOCET/ROXICET) 5-325 MG tablet Take 1-2 tablets by mouth every 6 (six) hours as needed for moderate pain or severe pain. 25 tablet 0   No current facility-administered medications for this visit.     OBJECTIVE: BP (!) 169/76 (BP Location: Right Arm, Patient Position: Sitting)   Pulse 73   Temp (!) 97.5 F (36.4 C) (Tympanic)   Wt 132 lb (59.9 kg)   BMI 21.97 kg/m    Body mass index is 21.97 kg/m.    ECOG FS:0 - Asymptomatic  General: Well-developed, well-nourished, no acute distress. Eyes: Pink conjunctiva, anicteric sclera. Lungs: Clear to auscultation bilaterally. Heart: Regular rate and rhythm. No rubs, murmurs, or gallops. Abdomen: Soft, nontender, nondistended. No organomegaly noted, normoactive bowel sounds. Musculoskeletal: No edema, cyanosis, or clubbing. Neuro: Alert, answering all questions appropriately. Cranial nerves grossly intact. Skin: No rashes or petechiae noted. Psych: Normal affect.   LAB RESULTS:  No visits with results within 3 Day(s) from this visit.   Latest known visit with results is:  Hospital Outpatient Visit on 04/20/2016  Component Date Value Ref Range Status  . Specimen Description 04/20/2016 URINE, RANDOM   Final  . Special Requests 04/20/2016 NONE   Final  . Culture 04/20/2016 >=100,000 COLONIES/mL ESCHERICHIA COLI*  Final  . Report Status 04/20/2016 04/23/2016 FINAL   Final  . Organism ID, Bacteria 04/20/2016 ESCHERICHIA COLI*  Final  . Color, Urine 04/20/2016 AMBER* YELLOW Final   BIOCHEMICALS MAY BE AFFECTED BY COLOR  . APPearance 04/20/2016 CLOUDY* CLEAR Final  . Specific Gravity, Urine 04/20/2016 1.021  1.005 - 1.030 Final  . pH 04/20/2016 5.0  5.0 - 8.0 Final  . Glucose, UA 04/20/2016 NEGATIVE  NEGATIVE mg/dL Final  . Hgb urine dipstick 04/20/2016 NEGATIVE  NEGATIVE Final  . Bilirubin Urine 04/20/2016 NEGATIVE  NEGATIVE Final  . Ketones, ur 04/20/2016 5* NEGATIVE mg/dL Final  . Protein, ur 04/20/2016 NEGATIVE  NEGATIVE mg/dL Final  . Nitrite 04/20/2016 NEGATIVE  NEGATIVE Final  . Leukocytes, UA 04/20/2016 LARGE* NEGATIVE Final  . RBC / HPF 04/20/2016 TOO NUMEROUS TO COUNT  0 - 5 RBC/hpf Final  . WBC, UA 04/20/2016 TOO NUMEROUS TO COUNT  0 - 5 WBC/hpf Final  . Bacteria, UA 04/20/2016 MANY* NONE SEEN Final  . Squamous Epithelial / LPF 04/20/2016 0-5* NONE SEEN Final  . Mucous 04/20/2016 PRESENT   Final    STUDIES: No results found.  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  PLAN:    1. 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 She also completed adjuvant chemotherapy and XRT. Her most recent CT scan on April 20, 2016 reported no new or progressive disease. No intervention is needed at this time. Patient is now greater than 5 years removed from completing her treatment and can be discharged from clinic. Please refer her back if there are any questions or concerns.  2.  Elevated protein: Previously, patient did not have an M spike but was noted to have a mildly elevated Free light chain. 3. Fractured pelvis: Continue rehabilitation and physical therapy as directed.  Approximately 20 minutes was spent in discussion of which greater than 50% was consultation.  Patient expressed understanding and was in agreement with this plan. She also understands that She can call clinic at any time with any questions, concerns, or complaints.   Lloyd Huger, MD   08/25/2016 4:48 PM

## 2016-08-22 ENCOUNTER — Inpatient Hospital Stay: Payer: PPO | Attending: Oncology | Admitting: Oncology

## 2016-08-22 VITALS — BP 169/76 | HR 73 | Temp 97.5°F | Wt 132.0 lb

## 2016-08-22 DIAGNOSIS — Z87891 Personal history of nicotine dependence: Secondary | ICD-10-CM | POA: Diagnosis not present

## 2016-08-22 DIAGNOSIS — C3411 Malignant neoplasm of upper lobe, right bronchus or lung: Secondary | ICD-10-CM | POA: Insufficient documentation

## 2016-08-22 DIAGNOSIS — Z8781 Personal history of (healed) traumatic fracture: Secondary | ICD-10-CM | POA: Insufficient documentation

## 2016-08-22 DIAGNOSIS — Z923 Personal history of irradiation: Secondary | ICD-10-CM | POA: Diagnosis not present

## 2016-08-22 DIAGNOSIS — K900 Celiac disease: Secondary | ICD-10-CM | POA: Insufficient documentation

## 2016-08-22 DIAGNOSIS — R7982 Elevated C-reactive protein (CRP): Secondary | ICD-10-CM | POA: Diagnosis not present

## 2016-08-22 DIAGNOSIS — Z9221 Personal history of antineoplastic chemotherapy: Secondary | ICD-10-CM | POA: Insufficient documentation

## 2016-08-22 DIAGNOSIS — Z9181 History of falling: Secondary | ICD-10-CM | POA: Insufficient documentation

## 2016-08-22 DIAGNOSIS — Z79899 Other long term (current) drug therapy: Secondary | ICD-10-CM | POA: Insufficient documentation

## 2016-08-22 DIAGNOSIS — Z803 Family history of malignant neoplasm of breast: Secondary | ICD-10-CM | POA: Insufficient documentation

## 2016-08-22 DIAGNOSIS — Z780 Asymptomatic menopausal state: Secondary | ICD-10-CM | POA: Diagnosis not present

## 2016-08-31 DIAGNOSIS — D649 Anemia, unspecified: Secondary | ICD-10-CM | POA: Diagnosis not present

## 2016-08-31 DIAGNOSIS — E784 Other hyperlipidemia: Secondary | ICD-10-CM | POA: Diagnosis not present

## 2016-09-04 DIAGNOSIS — K900 Celiac disease: Secondary | ICD-10-CM | POA: Diagnosis not present

## 2016-09-04 DIAGNOSIS — Z85118 Personal history of other malignant neoplasm of bronchus and lung: Secondary | ICD-10-CM | POA: Diagnosis not present

## 2016-09-04 DIAGNOSIS — D649 Anemia, unspecified: Secondary | ICD-10-CM | POA: Diagnosis not present

## 2016-09-04 DIAGNOSIS — E784 Other hyperlipidemia: Secondary | ICD-10-CM | POA: Diagnosis not present

## 2016-09-04 DIAGNOSIS — I700 Atherosclerosis of aorta: Secondary | ICD-10-CM | POA: Diagnosis not present

## 2016-09-04 DIAGNOSIS — M810 Age-related osteoporosis without current pathological fracture: Secondary | ICD-10-CM | POA: Diagnosis not present

## 2016-09-04 DIAGNOSIS — G8929 Other chronic pain: Secondary | ICD-10-CM | POA: Diagnosis not present

## 2016-09-04 DIAGNOSIS — M25552 Pain in left hip: Secondary | ICD-10-CM | POA: Diagnosis not present

## 2016-09-12 DIAGNOSIS — H2513 Age-related nuclear cataract, bilateral: Secondary | ICD-10-CM | POA: Diagnosis not present

## 2016-10-01 IMAGING — MG MM DIGITAL SCREENING BILAT W/ TOMO W/ CAD
9 of 12 series · 9 of 28 positions shown · non-contrast
Comparison: Previous exam(s).

CLINICAL DATA: Screening.

EXAM:
DIGITAL SCREENING BILATERAL MAMMOGRAM WITH 3D TOMO WITH CAD

[L MLO]
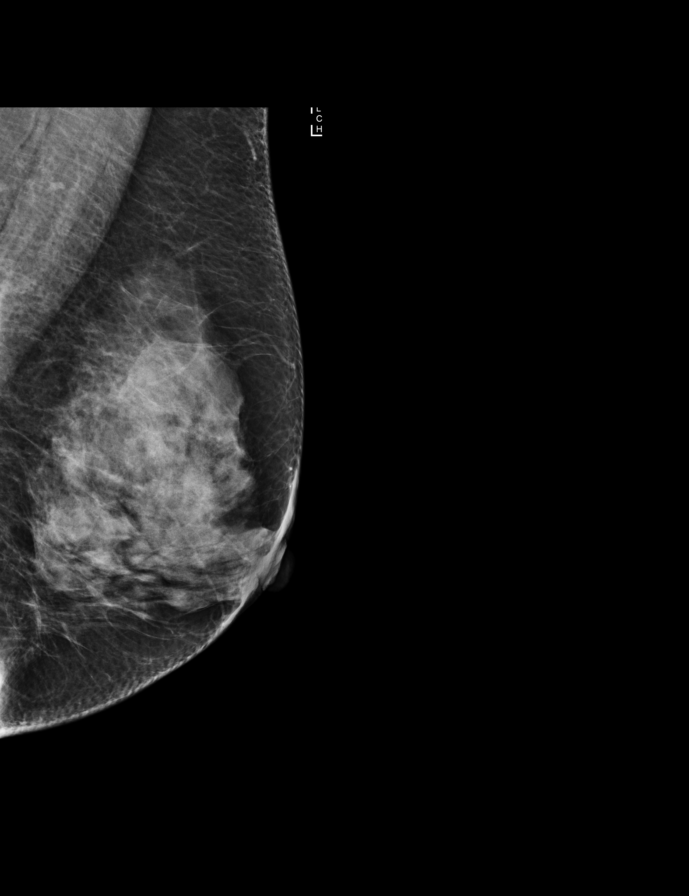

[L MLO synth-2D]
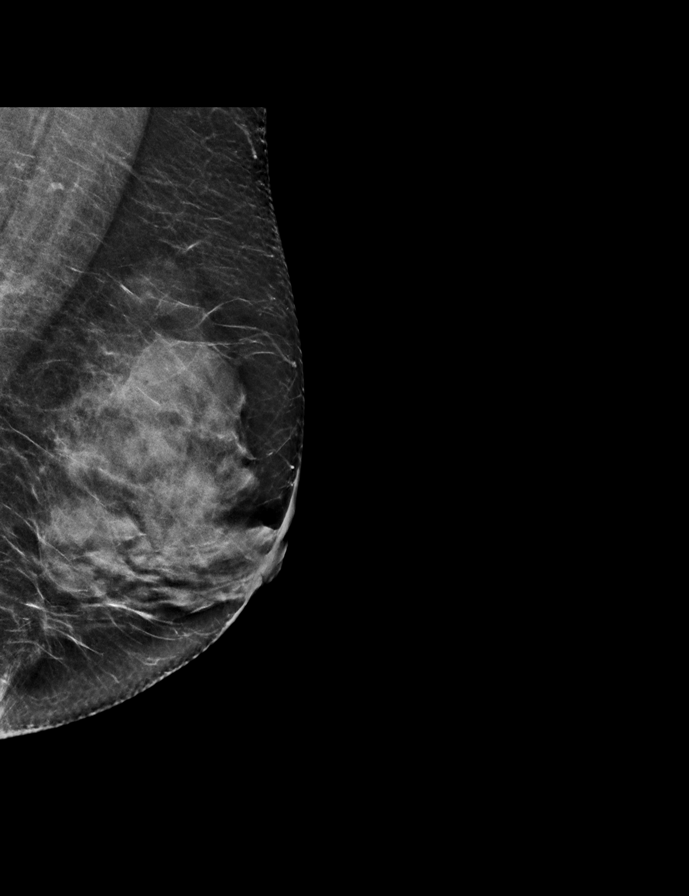

[L CC]
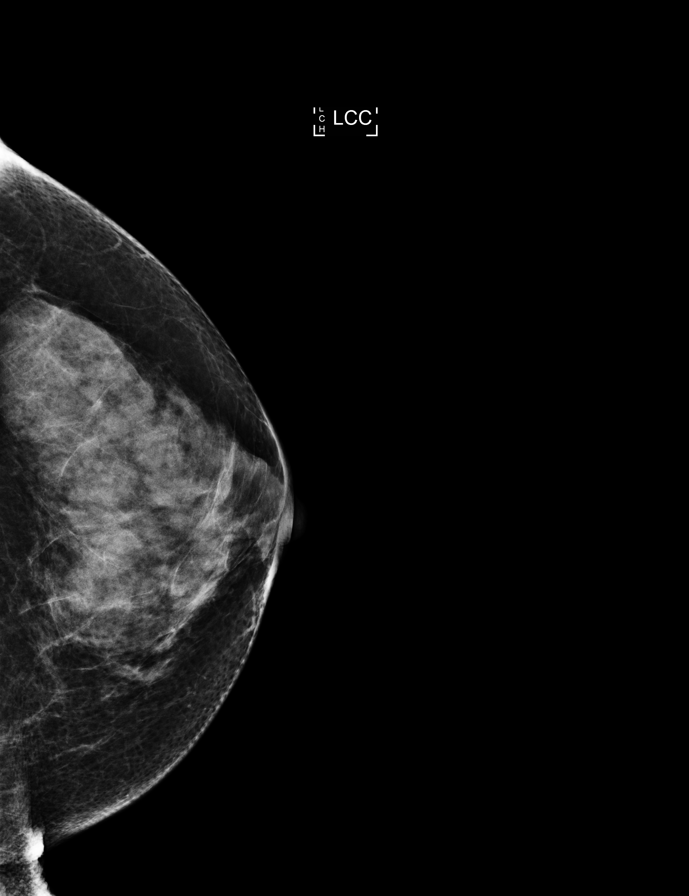

[R MLO]
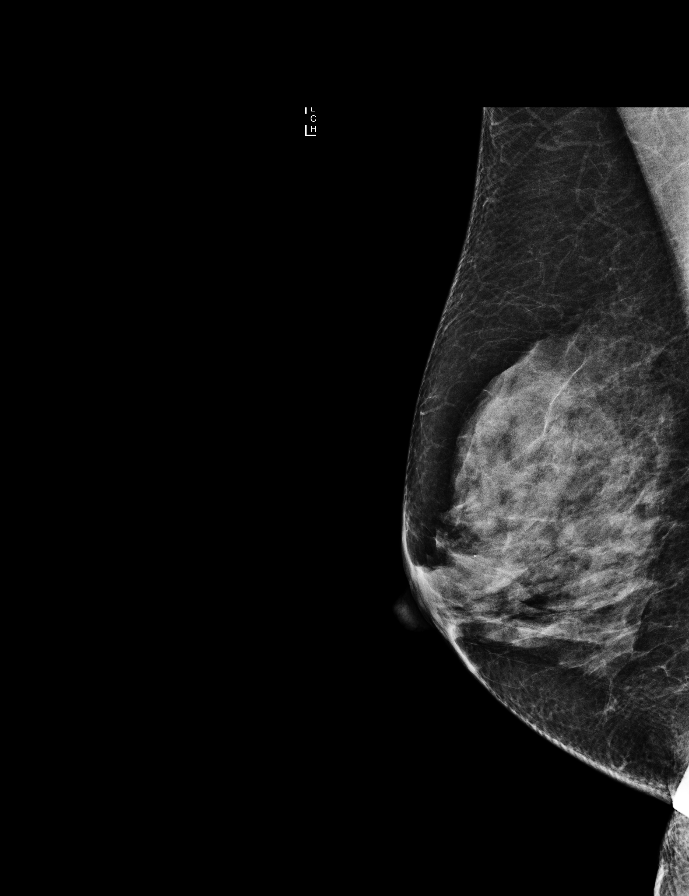

[R MLO synth-2D]
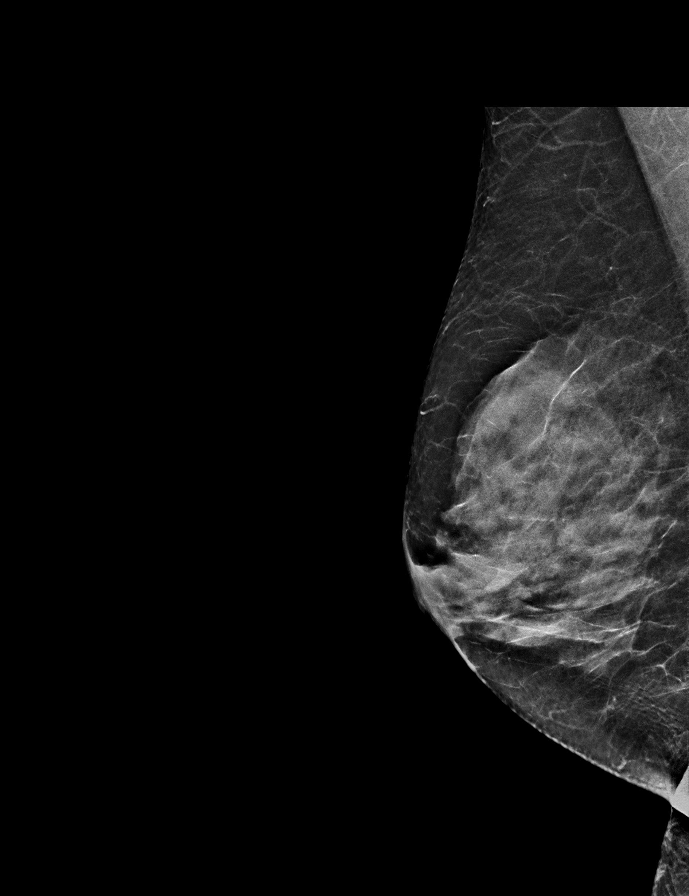

[L CC synth-2D]
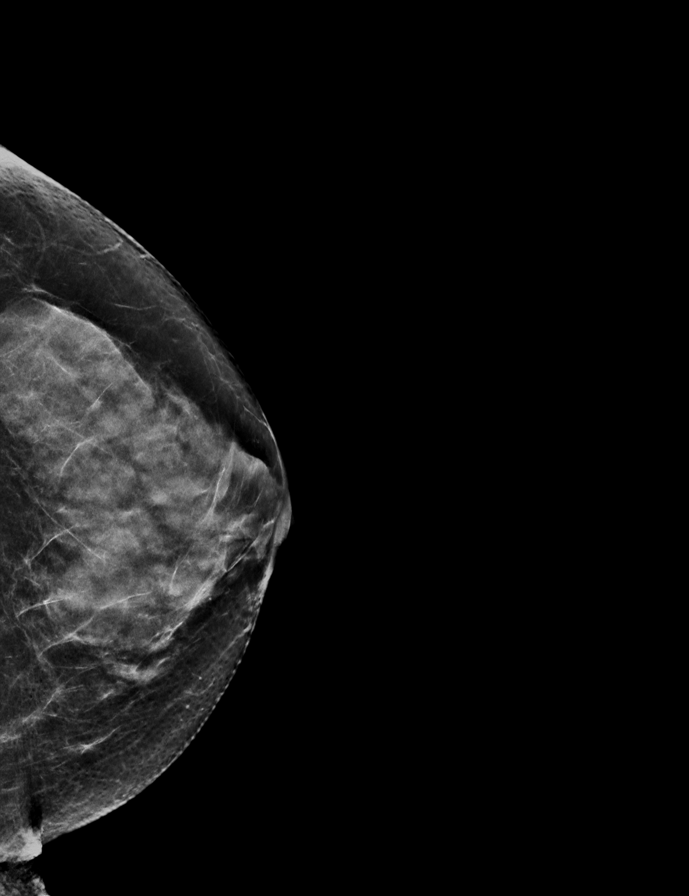

[R CC synth-2D]
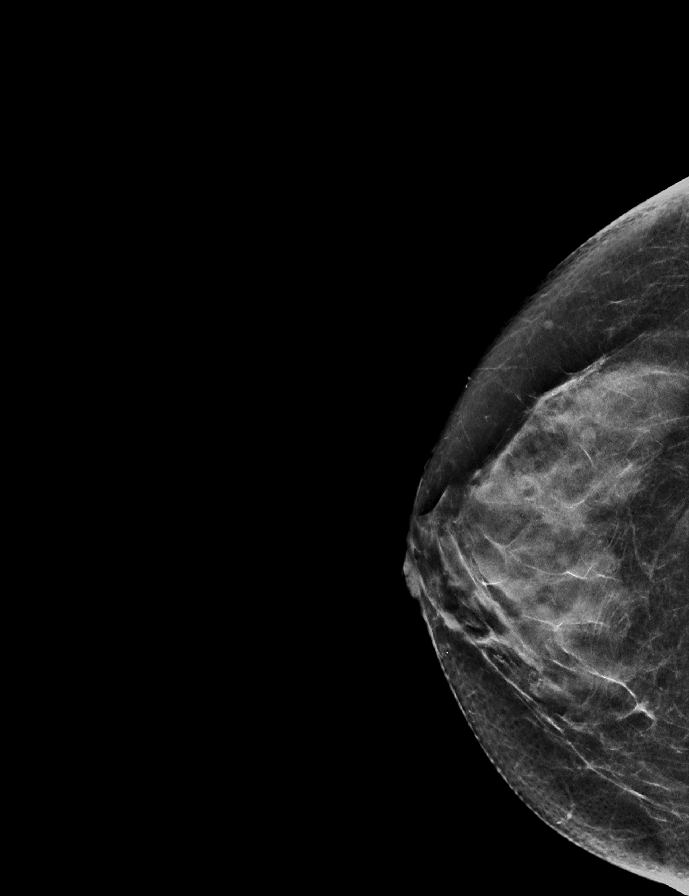

[R CC]
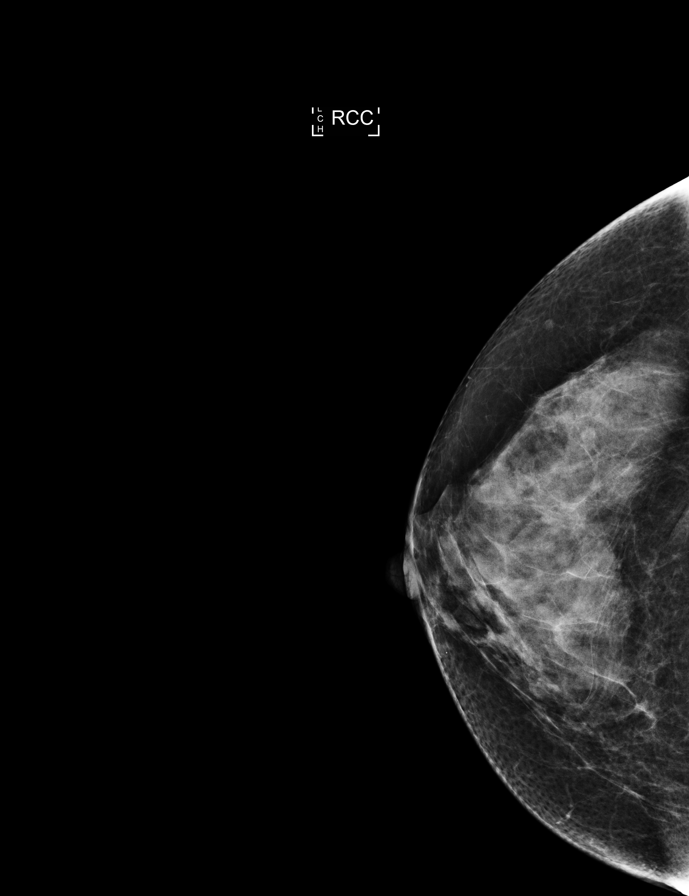

[R MLO tomo · tomo slice 29/56.0]
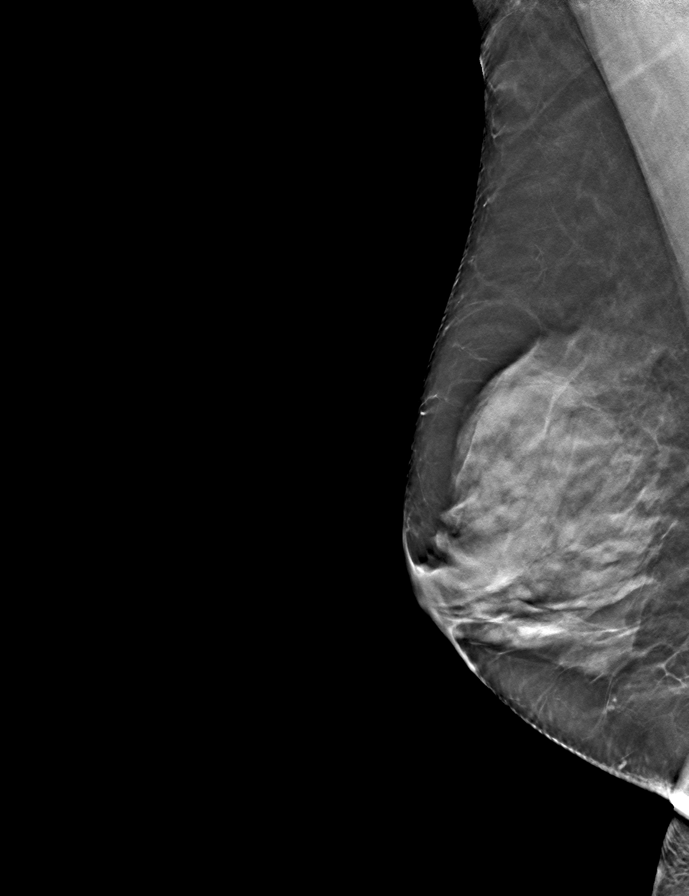

[9 of 28 positions shown; findings below may reference images not displayed]

ACR Breast Density Category d: The breast tissue is extremely dense,
which lowers the sensitivity of mammography.
FINDINGS: There are no findings suspicious for malignancy. Images were
processed with CAD.
IMPRESSION: No mammographic evidence of malignancy. A result letter of this
screening mammogram will be mailed directly to the patient.

RECOMMENDATION:
Screening mammogram in one year. (Code:JD-S-SN3)

BI-RADS CATEGORY  1: Negative.

## 2016-10-04 DIAGNOSIS — M1612 Unilateral primary osteoarthritis, left hip: Secondary | ICD-10-CM | POA: Diagnosis not present

## 2016-10-04 DIAGNOSIS — R2689 Other abnormalities of gait and mobility: Secondary | ICD-10-CM | POA: Diagnosis not present

## 2016-10-04 DIAGNOSIS — M21372 Foot drop, left foot: Secondary | ICD-10-CM | POA: Diagnosis not present

## 2016-10-24 DIAGNOSIS — R29898 Other symptoms and signs involving the musculoskeletal system: Secondary | ICD-10-CM | POA: Diagnosis not present

## 2016-10-24 DIAGNOSIS — R2681 Unsteadiness on feet: Secondary | ICD-10-CM | POA: Diagnosis not present

## 2016-11-21 ENCOUNTER — Ambulatory Visit: Payer: PPO | Attending: Neurology | Admitting: Physical Therapy

## 2016-11-21 ENCOUNTER — Encounter: Payer: Self-pay | Admitting: Physical Therapy

## 2016-11-21 DIAGNOSIS — R2681 Unsteadiness on feet: Secondary | ICD-10-CM

## 2016-11-21 DIAGNOSIS — M6281 Muscle weakness (generalized): Secondary | ICD-10-CM | POA: Insufficient documentation

## 2016-11-21 NOTE — Therapy (Signed)
[place] MAIN [facility] SERVICES [address] [place], [place], [address] Phone: [phone]   Fax:  [phone]  Physical Therapy Evaluation  Patient Details  Name: Krista Evans MRN: [id number] Date of Birth: 1945/04/12 Referring Provider: [name]; following up with [name];    Encounter Date: 11/21/2016  PT End of Session - 11/21/16 1247    Visit Number  1    Number of Visits  17    Date for PT Re-Evaluation  01/16/17    Authorization Type  gcode 1    Authorization Time Period  10    PT Start Time  1025    PT Stop Time  1115    PT Time Calculation (min)  50 min    Equipment Utilized During Treatment  Gait belt    Activity Tolerance  Patient tolerated treatment well;No increased pain    Behavior During Therapy  WFL for tasks assessed/performed       Past Medical History:  Diagnosis Date  . Cancer [facility]) 05/2011   Right upper Lung CA with partial Lobectomy.  . Celiac disease     Past Surgical History:  Procedure Laterality Date  . LUNG LOBECTOMY     right lung    There were no vitals filed for this visit.   Subjective Assessment - 11/21/16 1026    Subjective  "I still feel unsteady when I walk."     Pertinent History  71 yo Female reports falling at home and sustained multiple pelvic fractures; She had therapy at [facility] place for a while and reports that she didn't get as much rehab. she reports that she didn't recover well; She was discharged home and pursued home health PT; She had a few weeks of home health PT; Patient then pursued outpatient PT at [facility] for 1-2 months. At the time of discharge (Aug 2018) she was able to walk in home without AD holding onto furniture and would use SPC when outside home. She does have a RW but doesn't like to use it. She reports increased falls in last few months. She reports she was stepping on a bug and fell backwards during the summer; She denies any falls since she stopped  therapy; Patient reports that she is still walking in her home without the cane (holding onto furniture); She reports that she is still feels unsteady; Patient was referred to [name], neurologist for unsteadiness and foot drop; She will not be following up with [name]; [name] is her PCP and she will be following up with him; Patient denies any numbness/tingling; Patient reports that she was given HEP with outpatient therapy but stopped HEP a week after stopping therapy; She reports that when weather was pretty she would walk outside some, 2x a day; She reports since the weather has changed and gotten cooler she hasn't been walking as much and has started seeing a decline;     How long can you sit comfortably?  NA    How long can you stand comfortably?  30+ min;     How long can you walk comfortably?  <100 feet;     Currently in Pain?  Yes    Pain Score  0-No pain    Pain Location  Hip    Pain Orientation  Posterior    Pain Descriptors / Indicators  Aching    Pain Type  Chronic pain    Pain Onset  More than a month ago  Pain Frequency  Intermittent    Aggravating Factors   standing/walk for long distance;     Pain Relieving Factors  better with sitting/rest    Effect of Pain on Daily Activities  decreased standing/walking tolerance;     Multiple Pain Sites  No         [facility] PT Assessment - 11/21/16 0001      Assessment   Medical Diagnosis  Unsteadiness     Referring Provider  [name]; following up with [name];     Onset Date/Surgical Date  04/16/16    Hand Dominance  Left    Next MD Visit  none scheduled with Melrose Nakayama; following up with [name] in Feb 2019    Prior Therapy  Had multiple bouts of therapy including SNF, home health and outpatient PT; Finished outpatient in August 2018 for this condition;       Precautions   Precautions  Fall      Restrictions   Weight Bearing Restrictions  No      Balance Screen   Has the patient fallen in the past 6 months  Yes     How many times?  3    Has the patient had a decrease in activity level because of a fear of falling?   Yes    Is the patient reluctant to leave their home because of a fear of falling?   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; retired Corporate treasurer   Leisure  watch TV; visit family;       Cognition   Overall Cognitive Status  Difficult to assess seems to have some memory loss;    seems to have some memory loss;      Sensation   Light Touch  Appears Intact    Proprioception  Appears Intact      Coordination   Gross Motor Movements are Fluid and Coordinated  Yes    Fine Motor Movements are Fluid and Coordinated  Yes    Finger Nose Finger Test  accurate bilaterally;      Posture/Postural Control   Posture Comments  sits with erect posture, no abnormality noted; in standing:       ROM / Strength   AROM / PROM / Strength  AROM;Strength      AROM   Overall AROM Comments  BUE and BLE are [facility]      Strength   Right Hip Flexion  4-/5    Right Hip ABduction  4-/5    Right Hip ADduction  4-/5    Left Hip Flexion  3+/5    Left Hip ABduction  4-/5    Left Hip ADduction  4-/5    Right Knee Flexion  4/5    Right Knee Extension  4/5    Left Knee Flexion  4-/5    Left Knee Extension  4/5    Right Ankle Dorsiflexion  4/5    Left Ankle Dorsiflexion  4/5      Transfers   Comments  requires at least 1 HHA to push up from chair;  Ambulation/Gait   Gait Comments  ambulates without AD on level surface with wide base of support, arms bent, decreased gait speed with short step length; good foot clearance, no loss of balance laterally; when turning,  turns with wide base of support with short steps, slower speed;       Standardized Balance Assessment   Five times sit to stand comments   35 sec with 1 HHA (>15 sec indicates increased risk for falls)    10 Meter Walk  0.8 m/s without AD, home ambulator, slight risk for falls;       Berg Balance Test   Sit to Stand  Able to stand  independently using hands    Standing Unsupported  Able to stand 2 minutes with supervision    Sitting with Back Unsupported but Feet Supported on Floor or Stool  Able to sit safely and securely 2 minutes    Stand to Sit  Controls descent by using hands    Transfers  Able to transfer safely, definite need of hands    Standing Unsupported with Eyes Closed  Able to stand 10 seconds with supervision    Standing Ubsupported with Feet Together  Needs help to attain position but able to stand for 30 seconds with feet together    From Standing, Reach Forward with Outstretched Arm  Can reach forward >5 cm safely (2")    From Standing Position, Pick up Object from Floor  Able to pick up shoe, needs supervision    From Standing Position, Turn to Look Behind Over each Shoulder  Looks behind from both sides and weight shifts well    Turn 360 Degrees  Needs close supervision or verbal cueing    Standing Unsupported, Alternately Place Feet on Step/Stool  Needs assistance to keep from falling or unable to try    Standing Unsupported, One Foot in [employer] balance while stepping or standing    Standing on One Leg  Unable to try or needs assist to prevent fall    Total Score  30    Berg comment:  <36/56 indicates high fall risk;              Objective measurements completed on examination: See above findings.              PT Education - 11/21/16 1247    Education provided  Yes    Education Details  recommendations;     Person(s) Educated  Patient    Methods  Explanation    Comprehension  Verbalized understanding       PT Short Term Goals - 11/21/16  1521      PT SHORT TERM GOAL #1   Title  Patient will increase 10 meter walk test to >1.2ms as to improve gait speed for better community ambulation and to reduce fall risk.    Time  4    Period  Weeks    Status  New    Target Date  12/19/16      PT SHORT TERM GOAL #2   Title  Patient will be able to transfer sit<>Stand from regular height chair without pushing on arm rests to exhibit improved independence in sit<>Stand transfers and reduce fall risk;     Time  4    Period  Weeks    Status  New    Target Date  12/19/16      PT SHORT TERM GOAL #3   Title  Patient will demonstrate an improved  Berg Balance Score of >36/56  as to demonstrate improved balance with ADLs such as sitting/standing and transfer balance and reduced fall risk.     Time  4    Period  Weeks    Status  New    Target Date  12/19/16        PT Long Term Goals - 11/21/16 1522      PT LONG TERM GOAL #1   Title  Patient will be independent in home exercise program to improve strength/mobility for better functional independence with ADLs.    Time  8    Period  Weeks    Status  New    Target Date  01/16/17      PT LONG TERM GOAL #2   Title  Patient (> 35 years old) will complete five times sit to stand test in < 15 seconds indicating an increased LE strength and improved balance.    Time  8    Period  Weeks    Status  New    Target Date  01/16/17      PT LONG TERM GOAL #3   Title  Patient will demonstrate an improved Berg Balance Score of >42/56 as to demonstrate improved balance with ADLs such as sitting/standing and transfer balance and reduced fall risk.     Time  8    Period  Weeks    Status  New    Target Date  01/16/17      PT LONG TERM GOAL #4   Title  Patient will be able to negotiate a curb with LRAD, mod I exhibiting good safety awareness to improve community ambulation and reduce fall risk;     Time  8    Period  Weeks    Status  New    Target Date  01/16/17      PT LONG TERM GOAL #5    Title  Patient will increase BLE gross strength to 4+/5 as to improve functional strength for independent gait, increased standing tolerance and increased ADL ability.    Time  8    Period  Weeks    Status  New    Target Date  01/16/17             Plan - 11/21/16 1248    Clinical Impression Statement  71 yo Female presents to therapy reporting that she is unsteady with walking. Patient reports that this all began in April 2018 after a fall with pelvic fractures. She has had multiple bouts of PT and recently stopped outpatient PT in August 2018. Patient reports that even though she is able to walk short distances without AD she still feels unsteady and is not at her PLOF. Patient exhibits some weakness in BLE. She has difficulty with sit<>Stand transfers requiring at least 1 rail assist. Patient ambulates with wide base of support having difficulty with turns. Patient tested as a high fall risk with low Berg score. Patient would benefit from additional skilled PT Intervention to improve strength, balance and gait safety;     History and Personal Factors relevant to plan of care:  high fall risk, has steps to enter home, inconsistent with using AD; no recent falls; some memory issue;     Clinical Presentation  Evolving    Clinical Presentation due to:  high fall risk with decreased mobility;     Clinical Decision Making  Moderate    Rehab Potential  Fair    Clinical Impairments Affecting Rehab Potential  negative: concerned about HEP adherence and safety awareness;     PT Frequency  2x / week    PT Duration  8 weeks    PT Treatment/Interventions  ADLs/Self Care Home Management;Cryotherapy;Moist Heat;Gait training;Stair training;Functional mobility training;Therapeutic activities;Therapeutic exercise;Balance training;Neuromuscular re-education;Patient/family education;Energy conservation    PT Next Visit Plan  initiate HEP    PT Home Exercise Plan  will address next visit    Consulted and  Agree with Plan of Care  Patient       Patient will benefit from skilled therapeutic intervention in order to improve the following deficits and impairments:  Abnormal gait, Decreased endurance, Decreased strength, Decreased knowledge of use of DME, Decreased activity tolerance, Decreased balance, Decreased mobility, Difficulty walking, Decreased safety awareness  Visit Diagnosis: Unsteadiness on feet - Plan: PT plan of care cert/re-cert  Muscle weakness (generalized) - Plan: PT plan of care cert/re-cert  G-Codes - 45/80/99 1525    Functional Assessment Tool Used (Outpatient Only)  10 meter walk, Berg, transfer ability, clinical judgement;     Functional Limitation  Mobility: Walking and moving around    Mobility: Walking and Moving Around Current Status [phone])  At least 40 percent but less than 60 percent impaired, limited or restricted    Mobility: Walking and Moving Around Goal Status [phone])  At least 20 percent but less than 40 percent impaired, limited or restricted        Problem List Patient Active Problem List   Diagnosis Date Noted  . Pelvic fracture ([place]) 04/18/2016  . CD (celiac disease) 08/04/2014  . Cancer of upper lobe of right lung ([place]) 08/04/2014  . Age related osteoporosis 11/26/2013  . Absolute anemia 05/21/2013  . H/O malignant neoplasm 05/21/2013  . HLD (hyperlipidemia) 05/21/2013     Ebonie Westerlund PT, DPT 11/21/2016, 3:26 PM  [place] MAIN [facility] SERVICES [address] [place], [place], [address] Phone: [phone]   Fax:  [phone]  Name: Krista Evans MRN: [id number] Date of Birth: 02-14-1945

## 2016-11-23 ENCOUNTER — Ambulatory Visit: Payer: PPO | Admitting: Physical Therapy

## 2016-11-28 ENCOUNTER — Encounter: Payer: Self-pay | Admitting: Physical Therapy

## 2016-11-28 ENCOUNTER — Ambulatory Visit: Payer: PPO | Admitting: Physical Therapy

## 2016-11-28 DIAGNOSIS — M6281 Muscle weakness (generalized): Secondary | ICD-10-CM

## 2016-11-28 DIAGNOSIS — R2681 Unsteadiness on feet: Secondary | ICD-10-CM | POA: Diagnosis not present

## 2016-11-28 NOTE — Therapy (Signed)
[place] MAIN [facility] SERVICES [address] [place], [place], [address] Phone: [phone]   Fax:  [phone]  Physical Therapy Treatment  Patient Details  Name: Krista Evans MRN: [id number] Date of Birth: [birth year] Referring Provider: [name]; following up with [name];    Encounter Date: 11/28/2016  PT End of Session - 11/28/16 1027    Visit Number  2    Number of Visits  17    Date for PT Re-Evaluation  01/16/17    Authorization Type  gcode 2    Authorization Time Period  10    PT Start Time  1025    PT Stop Time  1100    PT Time Calculation (min)  35 min    Equipment Utilized During Treatment  Gait belt    Activity Tolerance  Patient tolerated treatment well;No increased pain    Behavior During Therapy  WFL for tasks assessed/performed       Past Medical History:  Diagnosis Date  . Cancer [facility]) 05/2011   Right upper Lung CA with partial Lobectomy.  . Celiac disease     Past Surgical History:  Procedure Laterality Date  . LUNG LOBECTOMY     right lung    There were no vitals filed for this visit.  Subjective Assessment - 11/28/16 1027    Subjective  Patient reports, "I have been trying to get up easier at home." She denies any pain; reports no changes since last session;     Pertinent History  71 yo Female reports falling at home and sustained multiple pelvic fractures; She had therapy at [facility] place for a while and reports that she didn't get as much rehab. she reports that she didn't recover well; She was discharged home and pursued home health PT; She had a few weeks of home health PT; Patient then pursued outpatient PT at [facility] for 1-2 months. At the time of discharge (Aug 2018) she was able to walk in home without AD holding onto furniture and would use SPC when outside home. She does have a RW but doesn't like to use it. She reports increased falls in last few months. She reports she was stepping on a bug  and fell backwards during the summer; She denies any falls since she stopped therapy; Patient reports that she is still walking in her home without the cane (holding onto furniture); She reports that she is still feels unsteady; Patient was referred to [name], neurologist for unsteadiness and foot drop; She will not be following up with [name]; [name] is her PCP and she will be following up with him; Patient denies any numbness/tingling; Patient reports that she was given HEP with outpatient therapy but stopped HEP a week after stopping therapy; She reports that when weather was pretty she would walk outside some, 2x a day; She reports since the weather has changed and gotten cooler she hasn't been walking as much and has started seeing a decline;     How long can you sit comfortably?  NA    How long can you stand comfortably?  30+ min;     How long can you walk comfortably?  <100 feet;     Currently in Pain?  No/denies         TREATMENT: Warm up on Nustep BUE/BLE level 2 x4 min  (unbilled):  PT initiated HEP: Seated with green tband around BLE: Hip flexion march x10 bilaterally; Hip abduction/ER x10  bilaterally; LAQ x10 bilaterally Ankle DF x10 bilaterally; Patient required min-moderate verbal/tactile cues for correct exercise technique including cues to increase ROM for better strengthening;  Seated ankle DF/PF for ankle ROM x10 reps;  Sit<>Stand from chair with pushing with 1 UE only x10 reps;  Backwards walking with rail to side (1 HHA as needed) x8 feet x4 laps; Patient required cues to increase step length for better reciprocal pattern; Patient reports increased fatigue with advanced exercise;   Standing on airex: Modified tandem stance (staggered stance): Head turns up/down, side/side x5 each foot in front with min A for safety Patient required min VCs for balance stability, including to increase trunk control for less loss of balance with smaller base of  support                    PT Education - 11/28/16 1027    Education provided  Yes    Education Details  HEP initiated, LE strengthening/balance;     Person(s) Educated  Patient    Methods  Explanation;Demonstration;Verbal cues;Handout    Comprehension  Verbalized understanding;Returned demonstration;Verbal cues required;Need further instruction       PT Short Term Goals - 11/21/16 1521      PT SHORT TERM GOAL #1   Title  Patient will increase 10 meter walk test to >1.54ms as to improve gait speed for better community ambulation and to reduce fall risk.    Time  4    Period  Weeks    Status  New    Target Date  12/19/16      PT SHORT TERM GOAL #2   Title  Patient will be able to transfer sit<>Stand from regular height chair without pushing on arm rests to exhibit improved independence in sit<>Stand transfers and reduce fall risk;     Time  4    Period  Weeks    Status  New    Target Date  12/19/16      PT SHORT TERM GOAL #3   Title  Patient will demonstrate an improved Berg Balance Score of >36/56  as to demonstrate improved balance with ADLs such as sitting/standing and transfer balance and reduced fall risk.     Time  4    Period  Weeks    Status  New    Target Date  12/19/16        PT Long Term Goals - 11/21/16 1522      PT LONG TERM GOAL #1   Title  Patient will be independent in home exercise program to improve strength/mobility for better functional independence with ADLs.    Time  8    Period  Weeks    Status  New    Target Date  01/16/17      PT LONG TERM GOAL #2   Title  Patient (> 67years old) will complete five times sit to stand test in < 15 seconds indicating an increased LE strength and improved balance.    Time  8    Period  Weeks    Status  New    Target Date  01/16/17      PT LONG TERM GOAL #3   Title  Patient will demonstrate an improved Berg Balance Score of >42/56 as to demonstrate improved balance with ADLs such as  sitting/standing and transfer balance and reduced fall risk.     Time  8    Period  Weeks    Status  New  Target Date  01/16/17      PT LONG TERM GOAL #4   Title  Patient will be able to negotiate a curb with LRAD, mod I exhibiting good safety awareness to improve community ambulation and reduce fall risk;     Time  8    Period  Weeks    Status  New    Target Date  01/16/17      PT LONG TERM GOAL #5   Title  Patient will increase BLE gross strength to 4+/5 as to improve functional strength for independent gait, increased standing tolerance and increased ADL ability.    Time  8    Period  Weeks    Status  New    Target Date  01/16/17            Plan - 11/28/16 1028    Clinical Impression Statement  Patient late to session; Instructed patient in HEP with LE strengthening and balance exercise. She requires min Vcs for correct positioning and LE movement for better strengthening; patient reports increased fatigue with advanced exercise. She required at least 1 rail with sit<>Stand and cues for forward weight shift for better transfer ability; Instructed patient in advanced balance exercise. She requires at least 1 rail assist with cues for weight shift and trunk control for better balance. she would benefit from additional skilled PT intervention to improve strength, balance and gait safety;     Rehab Potential  Fair    Clinical Impairments Affecting Rehab Potential  negative: concerned about HEP adherence and safety awareness;     PT Frequency  2x / week    PT Duration  8 weeks    PT Treatment/Interventions  ADLs/Self Care Home Management;Cryotherapy;Moist Heat;Gait training;Stair training;Functional mobility training;Therapeutic activities;Therapeutic exercise;Balance training;Neuromuscular re-education;Patient/family education;Energy conservation    PT Next Visit Plan  initiate HEP    PT Home Exercise Plan  will address next visit    Consulted and Agree with Plan of Care  Patient        Patient will benefit from skilled therapeutic intervention in order to improve the following deficits and impairments:  Abnormal gait, Decreased endurance, Decreased strength, Decreased knowledge of use of DME, Decreased activity tolerance, Decreased balance, Decreased mobility, Difficulty walking, Decreased safety awareness  Visit Diagnosis: Unsteadiness on feet  Muscle weakness (generalized)     Problem List Patient Active Problem List   Diagnosis Date Noted  . Pelvic fracture ([place]) 04/18/2016  . CD (celiac disease) 08/04/2014  . Cancer of upper lobe of right lung ([place]) 08/04/2014  . Age related osteoporosis 11/26/2013  . Absolute anemia 05/21/2013  . H/O malignant neoplasm 05/21/2013  . HLD (hyperlipidemia) 05/21/2013    Trotter,Margaret PT, DPT 11/28/2016, 10:54 AM  [place] MAIN [facility] SERVICES [address] [place], [place], [address] Phone: [phone]   Fax:  [phone]  Name: Krista Evans MRN: [id number] Date of Birth: 11/03/1945

## 2016-11-28 NOTE — Patient Instructions (Signed)
ABDUCTION: Sitting - Exercise Ball: Resistance Band (Active)   Sit with feet flat. With band tied around both legs, Lift right leg slightly and, against resistance band, draw it out to side. Complete __2_ sets of __10_ repetitions. Perform _2__ sessions per day.  Copyright  VHI. All rights reserved.  FLEXION: Sitting - Resistance Band (Active)   Sit, both feet flat. Have band tied around both legs above knees, lift right knee toward ceiling.Repeat with other knee Complete _2__ sets of _10__ repetitions. Perform _2__ sessions per day.  [email or web address]   Knee Extension: Resisted (Sitting)   With band looped around right ankle and under other foot, straighten leg with ankle loop. Keep other leg bent to increase resistance. Repeat _10___ times per set. Do __2__ sets per session. Do _2___ sessions per day.  http://[email or web address].[email or web address]   Copyright  VHI. All rights reserved.   Copyright  VHI. All rights reserved.  Copyright  VHI. All rights reserved.  FLEXION: Sitting - Resistance Band (Active)   Sit with right foot flat. Have band tied around both feet, bend ankle, bringing toes toward head. Complete __2_ sets of __10_ repetitions. Perform _2__ sessions per day.  Copyright  VHI. All rights reserved.  Toe / Heel Raise (Sitting)   Sitting, raise heels, then rock back on heels and raise toes. Repeat _10___ times.  Copyright  VHI. All rights reserved.  HIP / KNEE: Extension - Sit to Stand   Sitting, lean chest forward, raise hips up from surface. Straighten hips and knees. Weight bear equally on left and right sides. Backs of legs should not push off surface. __10_ reps per set, __2_ sets per day, _5__ days per week Use assistive device as needed.   Backward Walking   Walk backward, toes of each foot coming down first. Take long, even strides. Make sure you have a clear pathway with no obstructions when you do this. Stand beside counter and walk backward  And then  walk forward doing opposite directions; repeat 10 laps 2x a day at least 5 days a week.

## 2016-11-30 ENCOUNTER — Ambulatory Visit: Payer: PPO | Admitting: Physical Therapy

## 2016-12-05 ENCOUNTER — Ambulatory Visit: Payer: PPO | Admitting: Physical Therapy

## 2016-12-05 ENCOUNTER — Encounter: Payer: Self-pay | Admitting: Physical Therapy

## 2016-12-05 DIAGNOSIS — R2681 Unsteadiness on feet: Secondary | ICD-10-CM | POA: Diagnosis not present

## 2016-12-05 DIAGNOSIS — M6281 Muscle weakness (generalized): Secondary | ICD-10-CM

## 2016-12-05 NOTE — Therapy (Signed)
[place] MAIN [facility] SERVICES [address] [place], [place], [address] Phone: [phone]   Fax:  [phone]  Physical Therapy Treatment  Patient Details  Name: Krista Evans MRN: [id number] Date of Birth: 29-Jan-1945 Referring Provider: [name]; following up with [name];    Encounter Date: 12/05/2016  PT End of Session - 12/05/16 0931    Visit Number  3    Number of Visits  17    Date for PT Re-Evaluation  01/16/17    Authorization Type  gcode 3    Authorization Time Period  10    PT Start Time  0928    PT Stop Time  1010    PT Time Calculation (min)  42 min    Equipment Utilized During Treatment  Gait belt    Activity Tolerance  Patient tolerated treatment well;No increased pain    Behavior During Therapy  WFL for tasks assessed/performed       Past Medical History:  Diagnosis Date  . Cancer [facility]) 05/2011   Right upper Lung CA with partial Lobectomy.  . Celiac disease     Past Surgical History:  Procedure Laterality Date  . LUNG LOBECTOMY     right lung    There were no vitals filed for this visit.  Subjective Assessment - 12/05/16 0930    Subjective  Patient reports "I have been doing my exercise, but I don't think that I am much better." Patient denies any new falls; She reports no changes since last session;     Pertinent History  71 yo Female reports falling at home and sustained multiple pelvic fractures; She had therapy at [facility] place for a while and reports that she didn't get as much rehab. she reports that she didn't recover well; She was discharged home and pursued home health PT; She had a few weeks of home health PT; Patient then pursued outpatient PT at [facility] for 1-2 months. At the time of discharge (Aug 2018) she was able to walk in home without AD holding onto furniture and would use SPC when outside home. She does have a RW but doesn't like to use it. She reports increased falls in last few  months. She reports she was stepping on a bug and fell backwards during the summer; She denies any falls since she stopped therapy; Patient reports that she is still walking in her home without the cane (holding onto furniture); She reports that she is still feels unsteady; Patient was referred to [name], neurologist for unsteadiness and foot drop; She will not be following up with [name]; [name] is her PCP and she will be following up with him; Patient denies any numbness/tingling; Patient reports that she was given HEP with outpatient therapy but stopped HEP a week after stopping therapy; She reports that when weather was pretty she would walk outside some, 2x a day; She reports since the weather has changed and gotten cooler she hasn't been walking as much and has started seeing a decline;     How long can you sit comfortably?  NA    How long can you stand comfortably?  30+ min;     How long can you walk comfortably?  <100 feet;     Currently in Pain?  No/denies          TREATMENT: Warm up on Nustep BUE/BLE level 2 x4 min  (unbilled):  PT Advanced HEP: Standing with green tband around BLE:  Hip abduction x10 bilaterally; Hip extension x10 bilaterally; Side stepping x10 feet x2 laps each direction; Patient required min-moderate verbal/tactile cues for correct exercise technique including cues to increase ROM for better strengthening; She also required cues to avoid hip ER during abduction or knee flexion during hip extension for better hip strengthening;   Tandem stance on firm surface with 1-0 rail assist x10 sec hold x3 reps each foot in front with cues to improve core stabilization and upper trunk control for better static stance;  Leg press: BLE plate 75# 8G50 with cues to slow down LE movement for better LE control/strengthening; BLE heel raises 75# 2x10 with cues for positioning for better ankle strengthening;   4 square stepping clockwise x4 laps with min A and cues to  increase step length and narrow base of support; Patient had significant difficulty exhibiting short shuffled steps with wide base of support (ataxic gait pattern);  Instructed patient in side step over 1/2 bolster x5 each direction with B rail assist; Patient able to exhibit better step length with rail support. She required cues to increase step length for better foot clearance;  Forward/backward step over 1/2 bolster with B rail assist x5 each direction; Patient hesitant with backwards stepping due to weakness; She required cues for better weight shift and increase step length for better foot clearance;  Patient reports mild fatigue at end of session; She verbalized understanding in HEP exercise;                     PT Education - 12/05/16 0930    Education provided  Yes    Education Details  HEP advanced, LE strengthening, balance;     Person(s) Educated  Patient    Methods  Explanation;Demonstration;Verbal cues    Comprehension  Verbalized understanding;Returned demonstration;Verbal cues required;Need further instruction       PT Short Term Goals - 11/21/16 1521      PT SHORT TERM GOAL #1   Title  Patient will increase 10 meter walk test to >1.38ms as to improve gait speed for better community ambulation and to reduce fall risk.    Time  4    Period  Weeks    Status  New    Target Date  12/19/16      PT SHORT TERM GOAL #2   Title  Patient will be able to transfer sit<>Stand from regular height chair without pushing on arm rests to exhibit improved independence in sit<>Stand transfers and reduce fall risk;     Time  4    Period  Weeks    Status  New    Target Date  12/19/16      PT SHORT TERM GOAL #3   Title  Patient will demonstrate an improved Berg Balance Score of >36/56  as to demonstrate improved balance with ADLs such as sitting/standing and transfer balance and reduced fall risk.     Time  4    Period  Weeks    Status  New    Target Date  12/19/16         PT Long Term Goals - 11/21/16 1522      PT LONG TERM GOAL #1   Title  Patient will be independent in home exercise program to improve strength/mobility for better functional independence with ADLs.    Time  8    Period  Weeks    Status  New    Target Date  01/16/17  PT LONG TERM GOAL #2   Title  Patient (> 73 years old) will complete five times sit to stand test in < 15 seconds indicating an increased LE strength and improved balance.    Time  8    Period  Weeks    Status  New    Target Date  01/16/17      PT LONG TERM GOAL #3   Title  Patient will demonstrate an improved Berg Balance Score of >42/56 as to demonstrate improved balance with ADLs such as sitting/standing and transfer balance and reduced fall risk.     Time  8    Period  Weeks    Status  New    Target Date  01/16/17      PT LONG TERM GOAL #4   Title  Patient will be able to negotiate a curb with LRAD, mod I exhibiting good safety awareness to improve community ambulation and reduce fall risk;     Time  8    Period  Weeks    Status  New    Target Date  01/16/17      PT LONG TERM GOAL #5   Title  Patient will increase BLE gross strength to 4+/5 as to improve functional strength for independent gait, increased standing tolerance and increased ADL ability.    Time  8    Period  Weeks    Status  New    Target Date  01/16/17            Plan - 12/05/16 1029    Clinical Impression Statement  Patient instructed in advanced balance/strengthening exercise. Advanced HEP for better strengthening. She had some difficulty with standing tband exercise requiring cues to increase ROM for better strengthening. Patient instructed to hold onto counter with standing exercise for better balance control. She had significant difficulty with 4 square stepping due to impaired dynamic balance. Patient would benefit from additional skilled PT intervention to improve strength, balance and gait safety;     Rehab Potential   Fair    Clinical Impairments Affecting Rehab Potential  negative: concerned about HEP adherence and safety awareness;     PT Frequency  2x / week    PT Duration  8 weeks    PT Treatment/Interventions  ADLs/Self Care Home Management;Cryotherapy;Moist Heat;Gait training;Stair training;Functional mobility training;Therapeutic activities;Therapeutic exercise;Balance training;Neuromuscular re-education;Patient/family education;Energy conservation    PT Next Visit Plan  work on balance/strengthening    PT Home Exercise Plan  advanced- see patient instructions;     Consulted and Agree with Plan of Care  Patient       Patient will benefit from skilled therapeutic intervention in order to improve the following deficits and impairments:  Abnormal gait, Decreased endurance, Decreased strength, Decreased knowledge of use of DME, Decreased activity tolerance, Decreased balance, Decreased mobility, Difficulty walking, Decreased safety awareness  Visit Diagnosis: Unsteadiness on feet  Muscle weakness (generalized)     Problem List Patient Active Problem List   Diagnosis Date Noted  . Pelvic fracture ([place]) 04/18/2016  . CD (celiac disease) 08/04/2014  . Cancer of upper lobe of right lung ([place]) 08/04/2014  . Age related osteoporosis 11/26/2013  . Absolute anemia 05/21/2013  . H/O malignant neoplasm 05/21/2013  . HLD (hyperlipidemia) 05/21/2013    Trotter,Margaret PT, DPT 12/05/2016, 10:32 AM  [place] MAIN [facility] SERVICES [address] [place], [place], [address] Phone: [phone]   Fax:  [phone]  Name: Krista Evans MRN: [id number]  Date of Birth: [birth year]

## 2016-12-05 NOTE — Patient Instructions (Signed)
Tandem Standing   Stand beside kitchen sink and place one foot in front of the other, lift your hand and try to hold position for 10 sec. Repeat with other foot in front; Repeat 5 reps with each foot in front 5 days a week.Balance: Unilateral  Balance, Proprioception: Hip Abduction With Tubing   With tubing attached to both ankles, Standing holding onto counter, kick one leg out to side and then Return.  Repeat _10___ times  On each side.  Do ___2_ sessions per day.  [email or web address].[email or web address].[email or web address]   Balance, Proprioception: Hip Extension With Tubing   With tubing tied around both legs, holding onto kitchen counter, swing leg back. Return. Repeat _10___ times . Do __2__ sessions per day.  [email or web address].[email or web address]     Band Walk: Side Stepping   Tie band around legs, AROUND ANKLES. Step _10__ feet to one side, then step back to start. Repeat _2-3__ feet per session. Note: Small towel between band and skin eases rubbing.  [email or web address].[email or web address]

## 2016-12-12 ENCOUNTER — Ambulatory Visit: Payer: PPO | Admitting: Physical Therapy

## 2016-12-12 ENCOUNTER — Encounter: Payer: Self-pay | Admitting: Physical Therapy

## 2016-12-12 DIAGNOSIS — R2681 Unsteadiness on feet: Secondary | ICD-10-CM | POA: Diagnosis not present

## 2016-12-12 DIAGNOSIS — M6281 Muscle weakness (generalized): Secondary | ICD-10-CM

## 2016-12-12 NOTE — Therapy (Signed)
[place] MAIN [facility] SERVICES [address] [place], [place], [address] Phone: [phone]   Fax:  [phone]  Physical Therapy Treatment  Patient Details  Name: Krista Evans MRN: [id number] Date of Birth: 03-26-1945 Referring Provider: [name]; following up with [name];    Encounter Date: 12/12/2016  PT End of Session - 12/12/16 1104    Visit Number  4    Number of Visits  17    Date for PT Re-Evaluation  01/16/17    Authorization Type  gcode 4    Authorization Time Period  10    PT Start Time  0930    PT Stop Time  1015    PT Time Calculation (min)  45 min    Equipment Utilized During Treatment  Gait belt    Activity Tolerance  Patient tolerated treatment well;No increased pain    Behavior During Therapy  WFL for tasks assessed/performed       Past Medical History:  Diagnosis Date  . Cancer [facility]) 05/2011   Right upper Lung CA with partial Lobectomy.  . Celiac disease     Past Surgical History:  Procedure Laterality Date  . LUNG LOBECTOMY     right lung    There were no vitals filed for this visit.  Subjective Assessment - 12/12/16 1104    Subjective  Patient reports doing well; She denies any pain currently; Reports no new falls; Patient states that she has been working on her HEP and has had increased difficulty with tandem stance;     Pertinent History  71 yo Female reports falling at home and sustained multiple pelvic fractures; She had therapy at [facility] for a while and reports that she didn't get as much rehab. she reports that she didn't recover well; She was discharged home and pursued home health PT; She had a few weeks of home health PT; Patient then pursued outpatient PT at [facility] for 1-2 months. At the time of discharge (Aug 2018) she was able to walk in home without AD holding onto furniture and would use SPC when outside home. She does have a RW but doesn't like to use it. She reports increased  falls in last few months. She reports she was stepping on a bug and fell backwards during the summer; She denies any falls since she stopped therapy; Patient reports that she is still walking in her home without the cane (holding onto furniture); She reports that she is still feels unsteady; Patient was referred to [name], neurologist for unsteadiness and foot drop; She will not be following up with [name]; [name] is her PCP and she will be following up with him; Patient denies any numbness/tingling; Patient reports that she was given HEP with outpatient therapy but stopped HEP a week after stopping therapy; She reports that when weather was pretty she would walk outside some, 2x a day; She reports since the weather has changed and gotten cooler she hasn't been walking as much and has started seeing a decline;     How long can you sit comfortably?  NA    How long can you stand comfortably?  30+ min;     How long can you walk comfortably?  <100 feet;     Currently in Pain?  No/denies          TREATMENT: Warm up on Nustep BUE/BLE level 2 x5 min (unbilled):   Tandem stance on 1/2 bolster with 1-0  rail assist x10 sec hold x3 reps each foot in front with cues to improve core stabilization and upper trunk control for better static stance;  4 square stepping clockwise/counterclockwise x3 laps with min A and cues to increase step length and narrow base of support; multiple direction stepping x3 min with min A for safety and cues for sequencing;  Patient had significant difficulty exhibiting short shuffled steps with wide base of support (ataxic gait pattern);  Instructed patient in side step over 1/2 bolster x5 each direction with 1 rail assist; Patient able to exhibit better step length with rail support. She required cues to increase step length for better foot clearance;  Forward/backward step over 1/2 bolster with 1 rail assist x5 each direction; Patient hesitant with backwards  stepping due to weakness; She required cues for better weight shift and increase step length for better foot clearance;  Ladder drills: Attempted forward reciprocal gait pattern x4 laps; Patient required 1 HHA and max VCs for sequencing/foot placement. She had difficult not stepping on rungs with short shuffled step with ataxic gait pattern; after the 2nd attempt, patient able to progress to reciprocal walking with CGA and better smoothe steps with improved foot clearance;   Resisted walking, 7.5# forward/backward, x2 laps each with min A for safety and cues to slow down eccentric return for better dynamic balance; Patient had significant difficulty with backwards walking requiring 1 HHA and to  Improve weight shift for better dynamic balance;   Patient reports mild fatigue at end of session; She verbalized understanding in HEP exercise;                      PT Education - 12/12/16 1104    Education provided  Yes    Education Details  HEP reinforced, balance exercise    Person(s) Educated  Patient    Methods  Explanation;Demonstration;Verbal cues    Comprehension  Verbalized understanding;Returned demonstration;Verbal cues required;Need further instruction       PT Short Term Goals - 11/21/16 1521      PT SHORT TERM GOAL #1   Title  Patient will increase 10 meter walk test to >1.74ms as to improve gait speed for better community ambulation and to reduce fall risk.    Time  4    Period  Weeks    Status  New    Target Date  12/19/16      PT SHORT TERM GOAL #2   Title  Patient will be able to transfer sit<>Stand from regular height chair without pushing on arm rests to exhibit improved independence in sit<>Stand transfers and reduce fall risk;     Time  4    Period  Weeks    Status  New    Target Date  12/19/16      PT SHORT TERM GOAL #3   Title  Patient will demonstrate an improved Berg Balance Score of >36/56  as to demonstrate improved balance with ADLs such as  sitting/standing and transfer balance and reduced fall risk.     Time  4    Period  Weeks    Status  New    Target Date  12/19/16        PT Long Term Goals - 11/21/16 1522      PT LONG TERM GOAL #1   Title  Patient will be independent in home exercise program to improve strength/mobility for better functional independence with ADLs.    Time  8  Period  Weeks    Status  New    Target Date  01/16/17      PT LONG TERM GOAL #2   Title  Patient (> 62 years old) will complete five times sit to stand test in < 15 seconds indicating an increased LE strength and improved balance.    Time  8    Period  Weeks    Status  New    Target Date  01/16/17      PT LONG TERM GOAL #3   Title  Patient will demonstrate an improved Berg Balance Score of >42/56 as to demonstrate improved balance with ADLs such as sitting/standing and transfer balance and reduced fall risk.     Time  8    Period  Weeks    Status  New    Target Date  01/16/17      PT LONG TERM GOAL #4   Title  Patient will be able to negotiate a curb with LRAD, mod I exhibiting good safety awareness to improve community ambulation and reduce fall risk;     Time  8    Period  Weeks    Status  New    Target Date  01/16/17      PT LONG TERM GOAL #5   Title  Patient will increase BLE gross strength to 4+/5 as to improve functional strength for independent gait, increased standing tolerance and increased ADL ability.    Time  8    Period  Weeks    Status  New    Target Date  01/16/17            Plan - 12/12/16 1105    Clinical Impression Statement  Patient instructed in advanced dynamic balance exercise. She was able to exhibit better sequencing with 4 square but has significant difficulty when stepping backwards. Progress dynamic balance with reducing rail assist with stepping over obstacles. Instructed patient in ladder drills. Initially she had significant difficulty with ataxic pattern. However with increased repetition  was able to progress to reciprocal gait pattern; Patient would benefit from additional skilled PT intervention to improve dynamic balance and gait safety while improving LE strength;     Rehab Potential  Fair    Clinical Impairments Affecting Rehab Potential  negative: concerned about HEP adherence and safety awareness;     PT Frequency  2x / week    PT Duration  8 weeks    PT Treatment/Interventions  ADLs/Self Care Home Management;Cryotherapy;Moist Heat;Gait training;Stair training;Functional mobility training;Therapeutic activities;Therapeutic exercise;Balance training;Neuromuscular re-education;Patient/family education;Energy conservation    PT Next Visit Plan  work on balance/strengthening    PT Home Exercise Plan  advanced- see patient instructions;     Consulted and Agree with Plan of Care  Patient       Patient will benefit from skilled therapeutic intervention in order to improve the following deficits and impairments:  Abnormal gait, Decreased endurance, Decreased strength, Decreased knowledge of use of DME, Decreased activity tolerance, Decreased balance, Decreased mobility, Difficulty walking, Decreased safety awareness  Visit Diagnosis: Unsteadiness on feet  Muscle weakness (generalized)     Problem List Patient Active Problem List   Diagnosis Date Noted  . Pelvic fracture ([place]) 04/18/2016  . CD (celiac disease) 08/04/2014  . Cancer of upper lobe of right lung ([place]) 08/04/2014  . Age related osteoporosis 11/26/2013  . Absolute anemia 05/21/2013  . H/O malignant neoplasm 05/21/2013  . HLD (hyperlipidemia) 05/21/2013    Trotter,Margaret PT, DPT 12/12/2016, 11:13  AM  [place] MAIN [facility] SERVICES [address] [place], [place], [address] Phone: [phone]   Fax:  [phone]  Name: Krista Evans MRN: [id number] Date of Birth: 1945/03/31

## 2016-12-14 ENCOUNTER — Ambulatory Visit: Payer: PPO | Admitting: Physical Therapy

## 2016-12-21 ENCOUNTER — Ambulatory Visit: Payer: PPO | Admitting: Physical Therapy

## 2016-12-26 ENCOUNTER — Ambulatory Visit: Payer: PPO | Admitting: Physical Therapy

## 2016-12-28 ENCOUNTER — Ambulatory Visit: Payer: PPO | Admitting: Physical Therapy

## 2017-01-02 ENCOUNTER — Ambulatory Visit: Payer: PPO | Attending: Neurology | Admitting: Physical Therapy

## 2017-01-02 ENCOUNTER — Encounter: Payer: Self-pay | Admitting: Physical Therapy

## 2017-01-02 DIAGNOSIS — M6281 Muscle weakness (generalized): Secondary | ICD-10-CM | POA: Diagnosis not present

## 2017-01-02 DIAGNOSIS — R2681 Unsteadiness on feet: Secondary | ICD-10-CM | POA: Insufficient documentation

## 2017-01-02 NOTE — Therapy (Signed)
[place] MAIN [facility] SERVICES [address] [place], [place], [address] Phone: [phone]   Fax:  [phone]  Physical Therapy Treatment  Patient Details  Name: Krista Evans MRN: [id number] Date of Birth: August 26, 1945 Referring Provider: [name]; following up with [name];    Encounter Date: 01/02/2017  PT End of Session - 01/02/17 1022    Visit Number  5    Number of Visits  17    Date for PT Re-Evaluation  01/16/17    Authorization Type  gcode 5    Authorization Time Period  10    PT Start Time  1020    PT Stop Time  1100    PT Time Calculation (min)  40 min    Equipment Utilized During Treatment  Gait belt    Activity Tolerance  Patient tolerated treatment well;No increased pain    Behavior During Therapy  WFL for tasks assessed/performed       Past Medical History:  Diagnosis Date  . Cancer [facility]) 05/2011   Right upper Lung CA with partial Lobectomy.  . Celiac disease     Past Surgical History:  Procedure Laterality Date  . LUNG LOBECTOMY     right lung    There were no vitals filed for this visit.  Subjective Assessment - 01/02/17 1025    Subjective  Patient reports doing okay this morning; She states, "after the last time I was here I did fall when I went home. My legs just gave out from under me." She denies any injury related to fall; She denies any pain today;     Pertinent History  71 yo Female reports falling at home and sustained multiple pelvic fractures; She had therapy at [facility] for a while and reports that she didn't get as much rehab. she reports that she didn't recover well; She was discharged home and pursued home health PT; She had a few weeks of home health PT; Patient then pursued outpatient PT at [facility] for 1-2 months. At the time of discharge (Aug 2018) she was able to walk in home without AD holding onto furniture and would use SPC when outside home. She does have a RW but doesn't like to  use it. She reports increased falls in last few months. She reports she was stepping on a bug and fell backwards during the summer; She denies any falls since she stopped therapy; Patient reports that she is still walking in her home without the cane (holding onto furniture); She reports that she is still feels unsteady; Patient was referred to [name], neurologist for unsteadiness and foot drop; She will not be following up with [name]; [name] is her PCP and she will be following up with him; Patient denies any numbness/tingling; Patient reports that she was given HEP with outpatient therapy but stopped HEP a week after stopping therapy; She reports that when weather was pretty she would walk outside some, 2x a day; She reports since the weather has changed and gotten cooler she hasn't been walking as much and has started seeing a decline;     How long can you sit comfortably?  NA    How long can you stand comfortably?  30+ min;     How long can you walk comfortably?  <100 feet;     Currently in Pain?  No/denies         [facility] PT Assessment - 01/02/17 0001  Standardized Balance Assessment   Five times sit to stand comments   32 sec with 1 HHA (>15 sec indicates increased risk for falls, no change from 11/21/16 which was 35 sec with 1 HHA)    10 Meter Walk  0.77 m/s without AD, limited home ambulator, no significant change from 11/21/16 which was 0.8 m/s; did not use any assistive device;      Berg Balance Test   Sit to Stand  Able to stand  independently using hands    Standing Unsupported  Able to stand 2 minutes with supervision    Sitting with Back Unsupported but Feet Supported on Floor or Stool  Able to sit safely and securely 2 minutes    Stand to Sit  Controls descent by using hands    Transfers  Able to transfer safely, definite need of hands    Standing Unsupported with Eyes Closed  Able to stand 10 seconds with supervision    Standing Ubsupported with Feet Together  Needs  help to attain position but able to stand for 30 seconds with feet together    From Standing, Reach Forward with Outstretched Arm  Can reach forward >12 cm safely (5")    From Standing Position, Pick up Object from Floor  Able to pick up shoe, needs supervision    From Standing Position, Turn to Look Behind Over each Shoulder  Looks behind from both sides and weight shifts well    Turn 360 Degrees  Needs assistance while turning    Standing Unsupported, Alternately Place Feet on Step/Stool  Needs assistance to keep from falling or unable to try    Standing Unsupported, One Foot in Front  Able to take small step independently and hold 30 seconds    Standing on One Leg  Unable to try or needs assist to prevent fall    Total Score  32    Berg comment:  <36/56 indicates 100% risk for falls; no significant change from 11/21/16 which was 30/56     TREATMENT: Warm up on Nustep BUE/BLE level 0 x4 min (Unbilled);  PT instructed patient in 10 meter walk, 5 times sit<>stand and Berg Balance assessment to address goals; see above;    Patient educated in importance of HEP adherence/compliance as she has not been very adherent these last few weeks which has limited her progress; In addition spoke with patient and her caregiver about the importance of therapy attendance; Patient has missed several weeks due to various issues which has limited her progress as well;  Patient and caregiver expressed desire to continue with therapy and will be more adherent in the future.                   PT Education - 01/02/17 1026    Education provided  Yes    Education Details  HEP reinforced, progress towards goals;     Person(s) Educated  Patient    Methods  Explanation;Demonstration;Verbal cues    Comprehension  Verbalized understanding;Returned demonstration;Verbal cues required;Need further instruction       PT Short Term Goals - 01/02/17 1027      PT SHORT TERM GOAL #1   Title  Patient will  increase 10 meter walk test to >1.16ms as to improve gait speed for better community ambulation and to reduce fall risk.    Time  4    Period  Weeks    Status  Not Met    Target Date  12/19/16  PT SHORT TERM GOAL #2   Title  Patient will be able to transfer sit<>Stand from regular height chair without pushing on arm rests to exhibit improved independence in sit<>Stand transfers and reduce fall risk;     Time  4    Period  Weeks    Status  Not Met    Target Date  12/19/16      PT SHORT TERM GOAL #3   Title  Patient will demonstrate an improved Berg Balance Score of >36/56  as to demonstrate improved balance with ADLs such as sitting/standing and transfer balance and reduced fall risk.     Time  4    Period  Weeks    Status  Not Met    Target Date  12/19/16        PT Long Term Goals - 01/02/17 1027      PT LONG TERM GOAL #1   Title  Patient will be independent in home exercise program to improve strength/mobility for better functional independence with ADLs.    Time  8    Period  Weeks    Status  Not Met    Target Date  01/16/17      PT LONG TERM GOAL #2   Title  Patient (> 59 years old) will complete five times sit to stand test in < 15 seconds indicating an increased LE strength and improved balance.    Time  8    Period  Weeks    Status  Not Met    Target Date  01/16/17      PT LONG TERM GOAL #3   Title  Patient will demonstrate an improved Berg Balance Score of >42/56 as to demonstrate improved balance with ADLs such as sitting/standing and transfer balance and reduced fall risk.     Time  8    Period  Weeks    Status  Not Met    Target Date  01/16/17      PT LONG TERM GOAL #4   Title  Patient will be able to negotiate a curb with LRAD, mod I exhibiting good safety awareness to improve community ambulation and reduce fall risk;     Time  8    Period  Weeks    Status  Not Met    Target Date  01/16/17      PT LONG TERM GOAL #5   Title  Patient will increase  BLE gross strength to 4+/5 as to improve functional strength for independent gait, increased standing tolerance and increased ADL ability.    Time  8    Period  Weeks    Status  Not Met    Target Date  01/16/17            Plan - 01/02/17 1213    Clinical Impression Statement  patient instructed in outcome measures to assess progress towards goals. She demonstrates no significant change in gait speed, transfer ability or balance since starting therapy in November 2018. PT spoke with patient and she says that she is doing her exercises daily. Her husband reports that he has never seen her exercise. PT reinforced importance of HEP compliance and adherence to make progress. In addition, patient has missed several appointments becuase of bad weather and other issues. Reinforced importance of therapy attendance. patient would benefit from skilled PT intervention to improve strength, balance and gait safety; Patient verbalized understanding that she will need to be more compliant in this next month in order  to make progress to continue with therapy;     Rehab Potential  Fair    Clinical Impairments Affecting Rehab Potential  negative: concerned about HEP adherence and safety awareness;     PT Frequency  2x / week    PT Duration  8 weeks    PT Treatment/Interventions  ADLs/Self Care Home Management;Cryotherapy;Moist Heat;Gait training;Stair training;Functional mobility training;Therapeutic activities;Therapeutic exercise;Balance training;Neuromuscular re-education;Patient/family education;Energy conservation    PT Next Visit Plan  work on balance/strengthening    PT Home Exercise Plan  advanced- see patient instructions;     Consulted and Agree with Plan of Care  Patient       Patient will benefit from skilled therapeutic intervention in order to improve the following deficits and impairments:  Abnormal gait, Decreased endurance, Decreased strength, Decreased knowledge of use of DME, Decreased  activity tolerance, Decreased balance, Decreased mobility, Difficulty walking, Decreased safety awareness  Visit Diagnosis: Unsteadiness on feet  Muscle weakness (generalized)     Problem List Patient Active Problem List   Diagnosis Date Noted  . Pelvic fracture ([place]) 04/18/2016  . CD (celiac disease) 08/04/2014  . Cancer of upper lobe of right lung ([place]) 08/04/2014  . Age related osteoporosis 11/26/2013  . Absolute anemia 05/21/2013  . H/O malignant neoplasm 05/21/2013  . HLD (hyperlipidemia) 05/21/2013    Dimas Scheck PT, DPT 01/02/2017, 12:18 PM  [place] MAIN [facility] SERVICES [address] [place], [place], [address] Phone: [phone]   Fax:  [phone]  Name: Krista Evans MRN: [id number] Date of Birth: [birth year]

## 2017-01-04 ENCOUNTER — Ambulatory Visit: Payer: PPO | Admitting: Physical Therapy

## 2017-01-04 ENCOUNTER — Encounter: Payer: Self-pay | Admitting: Physical Therapy

## 2017-01-04 DIAGNOSIS — M6281 Muscle weakness (generalized): Secondary | ICD-10-CM

## 2017-01-04 DIAGNOSIS — R2681 Unsteadiness on feet: Secondary | ICD-10-CM

## 2017-01-04 NOTE — Therapy (Signed)
[place] MAIN [facility] SERVICES [address] [place], [place], [address] Phone: [phone]   Fax:  [phone]  Physical Therapy Treatment  Patient Details  Name: Krista Evans MRN: [id number] Date of Birth: [birth year] Referring Provider: [name]; following up with [name];    Encounter Date: 01/04/2017  PT End of Session - 01/04/17 1033    Visit Number  6    Number of Visits  17    Date for PT Re-Evaluation  01/16/17    Authorization Type  gcode 6    Authorization Time Period  10    PT Start Time  1020    PT Stop Time  1100    PT Time Calculation (min)  40 min    Equipment Utilized During Treatment  Gait belt    Activity Tolerance  Patient tolerated treatment well;No increased pain    Behavior During Therapy  WFL for tasks assessed/performed       Past Medical History:  Diagnosis Date  . Cancer [facility]) 05/2011   Right upper Lung CA with partial Lobectomy.  . Celiac disease     Past Surgical History:  Procedure Laterality Date  . LUNG LOBECTOMY     right lung    There were no vitals filed for this visit.  Subjective Assessment - 01/04/17 1031    Subjective  Patient reports doing her exercises more at home and states that she was more fatigued and sore. She reports no soreness today but states that she is a little tired;     Pertinent History  71 yo Female reports falling at home and sustained multiple pelvic fractures; She had therapy at [facility] for a while and reports that she didn't get as much rehab. she reports that she didn't recover well; She was discharged home and pursued home health PT; She had a few weeks of home health PT; Patient then pursued outpatient PT at [facility] for 1-2 months. At the time of discharge (Aug 2018) she was able to walk in home without AD holding onto furniture and would use SPC when outside home. She does have a RW but doesn't like to use it. She reports increased falls in last few  months. She reports she was stepping on a bug and fell backwards during the summer; She denies any falls since she stopped therapy; Patient reports that she is still walking in her home without the cane (holding onto furniture); She reports that she is still feels unsteady; Patient was referred to [name], neurologist for unsteadiness and foot drop; She will not be following up with [name]; [name] is her PCP and she will be following up with him; Patient denies any numbness/tingling; Patient reports that she was given HEP with outpatient therapy but stopped HEP a week after stopping therapy; She reports that when weather was pretty she would walk outside some, 2x a day; She reports since the weather has changed and gotten cooler she hasn't been walking as much and has started seeing a decline;     How long can you sit comfortably?  NA    How long can you stand comfortably?  30+ min;     How long can you walk comfortably?  <100 feet;     Currently in Pain?  No/denies          TREATMENT: Warm up on Nustep BUE/BLE level 2 x5 min (unbilled): Exercise: Leg press: BLE plate 90# 6E70 with cues to  avoid terminal knee extension and to slow down eccentric return for better motor control and strengthening; BLE heel raises 90# 2x12 with cues for positioning for better calf strengthening;   Seated: Hip flexion march 2# x15 bilaterally with visual cues to increase ROM to improve strengthening; LAQ 2# x15 bilaterally with cues to increase ROM for better strengthening; Seated hip adduction 3 sec hold x15 reps for better hip strengthening  Sit<>stand unsupported with yellow weighted ball overhead lift x10 reps with mod VCs to increase forward lean for better transfer ability; Patient had a lot of difficulty requiring mod A for better forward weight shift;   Balance: Attempted Resisted walking, 12.5# forward/backward, x1 laps each with mod A for safety and cues to slow down eccentric return for  better dynamic balance; Patient had significant posterior lean with less of balance and reports increased LE weakness having difficulty with exercise;   Patient reports mild fatigue at end of session; She verbalized understanding in HEP exercise;                     PT Education - 01/04/17 1031    Education provided  Yes    Education Details  HEP reinforced, LE strengthening, balance;     Person(s) Educated  Patient    Methods  Explanation;Demonstration;Verbal cues    Comprehension  Verbalized understanding;Returned demonstration;Verbal cues required;Need further instruction       PT Short Term Goals - 01/02/17 1027      PT SHORT TERM GOAL #1   Title  Patient will increase 10 meter walk test to >1.77ms as to improve gait speed for better community ambulation and to reduce fall risk.    Time  4    Period  Weeks    Status  Not Met    Target Date  12/19/16      PT SHORT TERM GOAL #2   Title  Patient will be able to transfer sit<>Stand from regular height chair without pushing on arm rests to exhibit improved independence in sit<>Stand transfers and reduce fall risk;     Time  4    Period  Weeks    Status  Not Met    Target Date  12/19/16      PT SHORT TERM GOAL #3   Title  Patient will demonstrate an improved Berg Balance Score of >36/56  as to demonstrate improved balance with ADLs such as sitting/standing and transfer balance and reduced fall risk.     Time  4    Period  Weeks    Status  Not Met    Target Date  12/19/16        PT Long Term Goals - 01/02/17 1027      PT LONG TERM GOAL #1   Title  Patient will be independent in home exercise program to improve strength/mobility for better functional independence with ADLs.    Time  8    Period  Weeks    Status  Not Met    Target Date  01/16/17      PT LONG TERM GOAL #2   Title  Patient (> 631years old) will complete five times sit to stand test in < 15 seconds indicating an increased LE strength and  improved balance.    Time  8    Period  Weeks    Status  Not Met    Target Date  01/16/17      PT LONG TERM GOAL #3  Title  Patient will demonstrate an improved Berg Balance Score of >42/56 as to demonstrate improved balance with ADLs such as sitting/standing and transfer balance and reduced fall risk.     Time  8    Period  Weeks    Status  Not Met    Target Date  01/16/17      PT LONG TERM GOAL #4   Title  Patient will be able to negotiate a curb with LRAD, mod I exhibiting good safety awareness to improve community ambulation and reduce fall risk;     Time  8    Period  Weeks    Status  Not Met    Target Date  01/16/17      PT LONG TERM GOAL #5   Title  Patient will increase BLE gross strength to 4+/5 as to improve functional strength for independent gait, increased standing tolerance and increased ADL ability.    Time  8    Period  Weeks    Status  Not Met    Target Date  01/16/17            Plan - 01/04/17 1200    Clinical Impression Statement  Patient instructed in advanced LE strengthening exercise. She required min VCs for correct exercise technique; Patient fatigues quickly requiring short rest breaks. In addition she often forgets instruction with new exercise requiring increased cues. Patient had significant difficulty with sit<>Stands unsupported. She also had significant difficulty with resisted walking due to poor weight shift. She would benefit from additional skilled PT intervention to improve strength, balance and gait safety;     Rehab Potential  Fair    Clinical Impairments Affecting Rehab Potential  negative: concerned about HEP adherence and safety awareness;     PT Frequency  2x / week    PT Duration  8 weeks    PT Treatment/Interventions  ADLs/Self Care Home Management;Cryotherapy;Moist Heat;Gait training;Stair training;Functional mobility training;Therapeutic activities;Therapeutic exercise;Balance training;Neuromuscular re-education;Patient/family  education;Energy conservation    PT Next Visit Plan  work on balance/strengthening    PT Bier  continue as given;     Consulted and Agree with Plan of Care  Patient       Patient will benefit from skilled therapeutic intervention in order to improve the following deficits and impairments:  Abnormal gait, Decreased endurance, Decreased strength, Decreased knowledge of use of DME, Decreased activity tolerance, Decreased balance, Decreased mobility, Difficulty walking, Decreased safety awareness  Visit Diagnosis: Unsteadiness on feet  Muscle weakness (generalized)     Problem List Patient Active Problem List   Diagnosis Date Noted  . Pelvic fracture ([place]) 04/18/2016  . CD (celiac disease) 08/04/2014  . Cancer of upper lobe of right lung ([place]) 08/04/2014  . Age related osteoporosis 11/26/2013  . Absolute anemia 05/21/2013  . H/O malignant neoplasm 05/21/2013  . HLD (hyperlipidemia) 05/21/2013    Brailyn Killion PT, DPT 01/04/2017, 12:02 PM  [place] MAIN [facility] SERVICES [address] [place], [place], [address] Phone: [phone]   Fax:  [phone]  Name: Krista Evans MRN: [id number] Date of Birth: 05/22/1945

## 2017-01-10 ENCOUNTER — Ambulatory Visit: Payer: PPO | Admitting: Physical Therapy

## 2017-01-10 ENCOUNTER — Encounter: Payer: Self-pay | Admitting: Physical Therapy

## 2017-01-10 DIAGNOSIS — R2681 Unsteadiness on feet: Secondary | ICD-10-CM | POA: Diagnosis not present

## 2017-01-10 DIAGNOSIS — M6281 Muscle weakness (generalized): Secondary | ICD-10-CM

## 2017-01-10 NOTE — Therapy (Signed)
[place] MAIN [facility] SERVICES [address] [place], [place], [address] Phone: [phone]   Fax:  [phone]  Physical Therapy Treatment  Patient Details  Name: Krista Evans MRN: [id number] Date of Birth: July 24, 1945 Referring Provider: [name]; following up with [name];    Encounter Date: 01/10/2017  PT End of Session - 01/10/17 1123    Visit Number  7    Number of Visits  17    Date for PT Re-Evaluation  01/16/17    Authorization Type  gcode 7    Authorization Time Period  10    PT Start Time  1115    PT Stop Time  1200    PT Time Calculation (min)  45 min    Equipment Utilized During Treatment  Gait belt    Activity Tolerance  Patient tolerated treatment well;No increased pain    Behavior During Therapy  WFL for tasks assessed/performed       Past Medical History:  Diagnosis Date  . Cancer [facility]) 05/2011   Right upper Lung CA with partial Lobectomy.  . Celiac disease     Past Surgical History:  Procedure Laterality Date  . LUNG LOBECTOMY     right lung    There were no vitals filed for this visit.  Subjective Assessment - 01/10/17 1122    Subjective  Patient reports doing okay; She states, "I am wearing heavier tennis shoes so its easier for me to walk." Her husband reports that she didn't exercise yesterday on Christmas but otherwise has been doing her exercises.     Pertinent History  71 yo Female reports falling at home and sustained multiple pelvic fractures; She had therapy at [facility] for a while and reports that she didn't get as much rehab. she reports that she didn't recover well; She was discharged home and pursued home health PT; She had a few weeks of home health PT; Patient then pursued outpatient PT at [facility] for 1-2 months. At the time of discharge (Aug 2018) she was able to walk in home without AD holding onto furniture and would use SPC when outside home. She does have a RW but doesn't like to  use it. She reports increased falls in last few months. She reports she was stepping on a bug and fell backwards during the summer; She denies any falls since she stopped therapy; Patient reports that she is still walking in her home without the cane (holding onto furniture); She reports that she is still feels unsteady; Patient was referred to [name], neurologist for unsteadiness and foot drop; She will not be following up with [name]; [name] is her PCP and she will be following up with him; Patient denies any numbness/tingling; Patient reports that she was given HEP with outpatient therapy but stopped HEP a week after stopping therapy; She reports that when weather was pretty she would walk outside some, 2x a day; She reports since the weather has changed and gotten cooler she hasn't been walking as much and has started seeing a decline;     How long can you sit comfortably?  NA    How long can you stand comfortably?  30+ min;     How long can you walk comfortably?  <100 feet;     Currently in Pain?  No/denies            TREATMENT: Warm up on Nustep BUE/BLE level 2 x41mn (unbilled): Exercise: Leg press:  BLE plate 105# 1W29 with cues to avoid terminal knee extension and to slow down eccentric return for better motor control and strengthening; BLE heel raises 105# 2x12 with cues for positioning for better calf strengthening;   Seated: Seated hip adduction 3 sec hold x15 reps for better hip strengthening  Standing: Mini squat with 2 HHA (fingertip hold) as needed for balance x10 reps with cues for positioning to improve quad control and LE strength; Standing with green tband around BLE: Hip abduction x12 bilaterally with cues to increase ROM for better hip strengthening and to keep knee straight for better hip strengthening; Hip extension x12 bilaterally; Required cues to keep knee straight for better gluteal/hamstring activation;  Side stepping x10 feet x2 laps each  direction with cues to keep feet oriented forward for better hip abductor strengthening;   Sit<>stand unsupported with yellow weighted ball overhead lift x10 reps with mod VCs to increase forward lean for better transfer ability; Patient had a lot of difficulty requiring mod A for better forward weight shift and increased time;   Balance: Resisted walking, 7.5# forward/backward, x1 laps each with mod A for safety and cues to slow down eccentric return for better dynamic balance;Patient had significant posterior lean with less of balance and reports increased LE weakness having difficulty with exercise;   Patient reports mild fatigue at end of session; She verbalized understanding in HEP exercise;                   PT Education - 01/10/17 1123    Education provided  Yes    Education Details  HEP reinforced, LE strengthening, balance;     Person(s) Educated  Patient    Methods  Explanation;Demonstration;Verbal cues    Comprehension  Verbalized understanding;Returned demonstration;Verbal cues required;Need further instruction       PT Short Term Goals - 01/02/17 1027      PT SHORT TERM GOAL #1   Title  Patient will increase 10 meter walk test to >1.86ms as to improve gait speed for better community ambulation and to reduce fall risk.    Time  4    Period  Weeks    Status  Not Met    Target Date  12/19/16      PT SHORT TERM GOAL #2   Title  Patient will be able to transfer sit<>Stand from regular height chair without pushing on arm rests to exhibit improved independence in sit<>Stand transfers and reduce fall risk;     Time  4    Period  Weeks    Status  Not Met    Target Date  12/19/16      PT SHORT TERM GOAL #3   Title  Patient will demonstrate an improved Berg Balance Score of >36/56  as to demonstrate improved balance with ADLs such as sitting/standing and transfer balance and reduced fall risk.     Time  4    Period  Weeks    Status  Not Met    Target Date   12/19/16        PT Long Term Goals - 01/02/17 1027      PT LONG TERM GOAL #1   Title  Patient will be independent in home exercise program to improve strength/mobility for better functional independence with ADLs.    Time  8    Period  Weeks    Status  Not Met    Target Date  01/16/17      PT LONG  TERM GOAL #2   Title  Patient (> 75 years old) will complete five times sit to stand test in < 15 seconds indicating an increased LE strength and improved balance.    Time  8    Period  Weeks    Status  Not Met    Target Date  01/16/17      PT LONG TERM GOAL #3   Title  Patient will demonstrate an improved Berg Balance Score of >42/56 as to demonstrate improved balance with ADLs such as sitting/standing and transfer balance and reduced fall risk.     Time  8    Period  Weeks    Status  Not Met    Target Date  01/16/17      PT LONG TERM GOAL #4   Title  Patient will be able to negotiate a curb with LRAD, mod I exhibiting good safety awareness to improve community ambulation and reduce fall risk;     Time  8    Period  Weeks    Status  Not Met    Target Date  01/16/17      PT LONG TERM GOAL #5   Title  Patient will increase BLE gross strength to 4+/5 as to improve functional strength for independent gait, increased standing tolerance and increased ADL ability.    Time  8    Period  Weeks    Status  Not Met    Target Date  01/16/17            Plan - 01/10/17 1255    Clinical Impression Statement  Patient instructed in advanced LE strengthening exercise. Advanced HEP with hip strengthening. Patient verbalized and demonstrated understanding; Patient does fatigue quickly; She had significant difficulty with sit<>stand requiring increased assistance and more time; Patient instructed in resisted walking at decreased resistance, but she continues to have trouble with weight shift and difficulty with walking; patient would benefit from additional skilled PT intervention to improve  strength, balance and gait safety;     Rehab Potential  Fair    Clinical Impairments Affecting Rehab Potential  negative: concerned about HEP adherence and safety awareness;     PT Frequency  2x / week    PT Duration  8 weeks    PT Treatment/Interventions  ADLs/Self Care Home Management;Cryotherapy;Moist Heat;Gait training;Stair training;Functional mobility training;Therapeutic activities;Therapeutic exercise;Balance training;Neuromuscular re-education;Patient/family education;Energy conservation    PT Next Visit Plan  work on balance/strengthening    PT [place]  continue as given;     Consulted and Agree with Plan of Care  Patient       Patient will benefit from skilled therapeutic intervention in order to improve the following deficits and impairments:  Abnormal gait, Decreased endurance, Decreased strength, Decreased knowledge of use of DME, Decreased activity tolerance, Decreased balance, Decreased mobility, Difficulty walking, Decreased safety awareness  Visit Diagnosis: Unsteadiness on feet  Muscle weakness (generalized)     Problem List Patient Active Problem List   Diagnosis Date Noted  . Pelvic fracture ([place]) 04/18/2016  . CD (celiac disease) 08/04/2014  . Cancer of upper lobe of right lung ([place]) 08/04/2014  . Age related osteoporosis 11/26/2013  . Absolute anemia 05/21/2013  . H/O malignant neoplasm 05/21/2013  . HLD (hyperlipidemia) 05/21/2013    Camey Edell PT, DPT 01/10/2017, 12:57 PM  [place] MAIN [facility] SERVICES [address] [place], [place], [address] Phone: [phone]   Fax:  [phone]  Name: Krista Evans [facility]  MRN: [id number] Date of Birth: [birth year]

## 2017-01-10 NOTE — Patient Instructions (Addendum)
Adduction: Hip - Knees Together (Sitting)    Sit with towel roll between knees. Push knees together. Hold for _5__ seconds. Repeat _15__ times. Do __2_ times a day.  Copyright  VHI. All rights reserved.    Mini Squat: Double Leg    With feet shoulder width apart, reach forward for balance and do a mini squat. Keep knees in line with second toe. Knees do not go past toes. (Hold onto kitchen counter/sink as needed for balance) Repeat _10__ times per set.  Do __2_ sets per session.  http://plyo.exer.us/70   Copyright  VHI. All rights reserved.

## 2017-01-18 ENCOUNTER — Encounter: Payer: Self-pay | Admitting: Physical Therapy

## 2017-01-18 ENCOUNTER — Ambulatory Visit: Payer: PPO | Attending: Neurology | Admitting: Physical Therapy

## 2017-01-18 DIAGNOSIS — R2681 Unsteadiness on feet: Secondary | ICD-10-CM | POA: Diagnosis not present

## 2017-01-18 DIAGNOSIS — M6281 Muscle weakness (generalized): Secondary | ICD-10-CM | POA: Insufficient documentation

## 2017-01-18 NOTE — Therapy (Signed)
[place] MAIN [facility] SERVICES [address] [place], [place], [address] Phone: [phone]   Fax:  [phone]  Physical Therapy Treatment  Patient Details  Name: Krista Evans MRN: [id number] Date of Birth: 1945/05/13 Referring Provider: [name]; following up with [name];    Encounter Date: 01/18/2017  PT End of Session - 01/18/17 1010    Visit Number  8    Number of Visits  25    Date for PT Re-Evaluation  02/15/17    Authorization Type  visits 2019: 1    PT Start Time  0093    PT Stop Time  1100    PT Time Calculation (min)  45 min    Equipment Utilized During Treatment  Gait belt    Activity Tolerance  Patient tolerated treatment well;No increased pain    Behavior During Therapy  WFL for tasks assessed/performed       Past Medical History:  Diagnosis Date  . 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  . Celiac disease     Past Surgical History:  Procedure Laterality Date  . LUNG LOBECTOMY     right lung    There were no vitals filed for this visit.  Subjective Assessment - 01/18/17 1025    Subjective  Patient reports feeling okay this morning. She reports compliance with HEP; She reports, "I have a little soreness/ache in my left hip. It could be because of the wet weather. I have had it before."     Pertinent History  72 yo Female reports falling at home and sustained multiple pelvic fractures; She had therapy at [facility] for a while and reports that she didn't get as much rehab. she reports that she didn't recover well; She was discharged home and pursued home health PT; She had a few weeks of home health PT; Patient then pursued outpatient PT at [facility] for 1-2 months. At the time of discharge (Aug 2018) she was able to walk in home without AD holding onto furniture and would use SPC when outside home. She does have a RW but doesn't like to use it. She reports increased falls in last few  months. She reports she was stepping on a bug and fell backwards during the summer; She denies any falls since she stopped therapy; Patient reports that she is still walking in her home without the cane (holding onto furniture); She reports that she is still feels unsteady; Patient was referred to [name], neurologist for unsteadiness and foot drop; She will not be following up with [name]; [name] is her PCP and she will be following up with him; Patient denies any numbness/tingling; Patient reports that she was given HEP with outpatient therapy but stopped HEP a week after stopping therapy; She reports that when weather was pretty she would walk outside some, 2x a day; She reports since the weather has changed and gotten cooler she hasn't been walking as much and has started seeing a decline;     How long can you sit comfortably?  NA    How long can you stand comfortably?  30+ min;     How long can you walk comfortably?  <100 feet;     Currently in Pain?  Yes    Pain Score  2     Pain Location  Hip    Pain Orientation  Left    Pain Descriptors / Indicators  Aching  Pain Type  Chronic pain    Pain Onset  More than a month ago    Pain Frequency  Intermittent    Aggravating Factors   worse with wet weather; standing/walking long distance    Pain Relieving Factors  better sitting/rest    Effect of Pain on Daily Activities  decreased standing/walking tolerance;     Multiple Pain Sites  No           TREATMENT: Warm up on Nustep BUE/BLE level 2 x21mn (unbilled): Exercise: Leg press: BLE plate 105# [id number] cues to avoid terminal knee extension and to slow down eccentric return for better motor control and strengthening; BLE heel raises 105# 2x12 with cues for positioning for better calf strengthening;  Seated: Seated hip adduction 3 sec hold x10 reps for better hip strengthening  Standing with green tband around BLE: Hip abduction x15 bilaterally with cues to increase ROM  for better hip strengthening and to keep knee straight for better hip strengthening;  Sit<>stand unsupported  x10 reps with mod VCs to increase forward lean for better transfer ability;Patient had a lot of difficulty requiring min A for better forward weight shift and increased time;   Balance: Standing on airex: Alternate toe taps on 4 inch step with 1 rail assist x12 bilaterally with mod VCs for sequencing, LE positioning and to improve weight shift and reduce UE rail assist for  Better balance challenge; Standing one foot on airex, one foot on 4 inch step with BUE ball pass side/side x10 reps each direction with min A for safety and cues to improve weight shift for less lean on parallel bars; Modified tandem stance, unsupported, head turns side/side x5 reps each direction with verbal cues to improve gaze stabilization for less dizziness/unsteadiness;   Patient reports moderate fatigue at end of session; She verbalized understanding in HEP exercise;                      PT Education - 01/18/17 1009    Education provided  Yes    Education Details  HEP reinforced, balance/LE strengthening;     Person(s) Educated  Patient    Methods  Explanation;Demonstration;Verbal cues    Comprehension  Verbalized understanding;Returned demonstration;Verbal cues required;Need further instruction       PT Short Term Goals - 01/18/17 1011      PT SHORT TERM GOAL #1   Title  Patient will increase 10 meter walk test to >1.061m as to improve gait speed for better community ambulation and to reduce fall risk.    Time  4    Period  Weeks    Status  Not Met    Target Date  02/15/17      PT SHORT TERM GOAL #2   Title  Patient will be able to transfer sit<>Stand from regular height chair without pushing on arm rests to exhibit improved independence in sit<>Stand transfers and reduce fall risk;     Time  4    Period  Weeks    Status  Not Met    Target Date  02/15/17      PT  SHORT TERM GOAL #3   Title  Patient will demonstrate an improved Berg Balance Score of >36/56  as to demonstrate improved balance with ADLs such as sitting/standing and transfer balance and reduced fall risk.     Time  4    Period  Weeks    Status  Not Met    Target  Date  02/15/17        PT Long Term Goals - 01/18/17 1010      PT LONG TERM GOAL #1   Title  Patient will be independent in home exercise program to improve strength/mobility for better functional independence with ADLs.    Time  4    Period  Weeks    Status  Not Met    Target Date  02/15/17      PT LONG TERM GOAL #2   Title  Patient (> 27 years old) will complete five times sit to stand test in < 15 seconds indicating an increased LE strength and improved balance.    Time  4    Period  Weeks    Status  Not Met    Target Date  02/15/17      PT LONG TERM GOAL #3   Title  Patient will demonstrate an improved Berg Balance Score of >42/56 as to demonstrate improved balance with ADLs such as sitting/standing and transfer balance and reduced fall risk.     Time  4    Period  Weeks    Status  Not Met    Target Date  02/15/17      PT LONG TERM GOAL #4   Title  Patient will be able to negotiate a curb with LRAD, mod I exhibiting good safety awareness to improve community ambulation and reduce fall risk;     Time  4    Period  Weeks    Status  Not Met    Target Date  02/15/17      PT LONG TERM GOAL #5   Title  Patient will increase BLE gross strength to 4+/5 as to improve functional strength for independent gait, increased standing tolerance and increased ADL ability.    Time  4    Period  Weeks    Status  Not Met    Target Date  02/15/17            Plan - 01/18/17 1141    Clinical Impression Statement  patient reports increased fatigue today; She had a harder time walking exhibiting a wider based of support and slower gait speed. Instructed patient in advanced balance exercise. patient had signficant  difficulty with toe taps especially with less rail assist. During balance exercise, patient became heavily fatigued. Concerned that patient is still having significant weakness despite reporting compliance with HEP; Reinforced importace of HEP compliance/adherence for strengthening. Patient would benefit from additional skilled PT intervention to improve strength, balance and gait safety;     Rehab Potential  Fair    Clinical Impairments Affecting Rehab Potential  negative: concerned about HEP adherence and safety awareness;     PT Frequency  2x / week    PT Duration  8 weeks    PT Treatment/Interventions  ADLs/Self Care Home Management;Cryotherapy;Moist Heat;Gait training;Stair training;Functional mobility training;Therapeutic activities;Therapeutic exercise;Balance training;Neuromuscular re-education;Patient/family education;Energy conservation    PT Next Visit Plan  work on balance/strengthening    PT [place]  continue as given;     Consulted and Agree with Plan of Care  Patient       Patient will benefit from skilled therapeutic intervention in order to improve the following deficits and impairments:  Abnormal gait, Decreased endurance, Decreased strength, Decreased knowledge of use of DME, Decreased activity tolerance, Decreased balance, Decreased mobility, Difficulty walking, Decreased safety awareness  Visit Diagnosis: Unsteadiness on feet  Muscle weakness (generalized)     Problem  List Patient Active Problem List   Diagnosis Date Noted  . Pelvic fracture ([place]) 04/18/2016  . CD (celiac disease) 08/04/2014  . Cancer of upper lobe of right lung ([place]) 08/04/2014  . Age related osteoporosis 11/26/2013  . Absolute anemia 05/21/2013  . H/O malignant neoplasm 05/21/2013  . HLD (hyperlipidemia) 05/21/2013    Trotter,Margaret PT, DPT 01/18/2017, 11:49 AM  [place] MAIN [facility] SERVICES [address] [place], [place], [address] Phone:  [phone]   Fax:  [phone]  Name: Krista Evans MRN: [id number] Date of Birth: Dec 30, 1945

## 2017-01-22 ENCOUNTER — Ambulatory Visit: Payer: PPO | Admitting: Physical Therapy

## 2017-01-22 ENCOUNTER — Encounter: Payer: Self-pay | Admitting: Physical Therapy

## 2017-01-22 DIAGNOSIS — R2681 Unsteadiness on feet: Secondary | ICD-10-CM

## 2017-01-22 DIAGNOSIS — M6281 Muscle weakness (generalized): Secondary | ICD-10-CM

## 2017-01-22 NOTE — Therapy (Signed)
[place] MAIN [facility] SERVICES [address] [place], [place], [address] Phone: [phone]   Fax:  [phone]  Physical Therapy Treatment  Patient Details  Name: Krista Evans MRN: [id number] Date of Birth: 1945/12/17 Referring Provider: [name]; following up with [name];    Encounter Date: 01/22/2017  PT End of Session - 01/22/17 0857    Visit Number  9    Number of Visits  25    Date for PT Re-Evaluation  02/15/17    Authorization Type  visits 2019: 2    PT Start Time  0845    PT Stop Time  0930    PT Time Calculation (min)  45 min    Equipment Utilized During Treatment  Gait belt    Activity Tolerance  Patient tolerated treatment well;No increased pain    Behavior During Therapy  WFL for tasks assessed/performed       Past Medical History:  Diagnosis Date  . Cancer [facility]) 05/2011   Right upper Lung CA with partial Lobectomy.  . Celiac disease     Past Surgical History:  Procedure Laterality Date  . LUNG LOBECTOMY     right lung    There were no vitals filed for this visit.  Subjective Assessment - 01/22/17 0856    Subjective  patient reports compliance with HEP; She reports still using older green band because, "It was more comfortable" Patient denies any soreness or pain today;     Pertinent History  72 yo Female reports falling at home and sustained multiple pelvic fractures; She had therapy at [facility] for a while and reports that she didn't get as much rehab. she reports that she didn't recover well; She was discharged home and pursued home health PT; She had a few weeks of home health PT; Patient then pursued outpatient PT at [facility] for 1-2 months. At the time of discharge (Aug 2018) she was able to walk in home without AD holding onto furniture and would use SPC when outside home. She does have a RW but doesn't like to use it. She reports increased falls in last few months. She reports she was stepping on a  bug and fell backwards during the summer; She denies any falls since she stopped therapy; Patient reports that she is still walking in her home without the cane (holding onto furniture); She reports that she is still feels unsteady; Patient was referred to [name], neurologist for unsteadiness and foot drop; She will not be following up with [name]; [name] is her PCP and she will be following up with him; Patient denies any numbness/tingling; Patient reports that she was given HEP with outpatient therapy but stopped HEP a week after stopping therapy; She reports that when weather was pretty she would walk outside some, 2x a day; She reports since the weather has changed and gotten cooler she hasn't been walking as much and has started seeing a decline;     How long can you sit comfortably?  NA    How long can you stand comfortably?  30+ min;     How long can you walk comfortably?  <100 feet;     Currently in Pain?  No/denies    Pain Onset  More than a month ago    Multiple Pain Sites  No            TREATMENT: Warm up on Nustep BUE/BLE level 2 x31mn (unbilled): Exercise: Leg press:  BLE plate105# 2x12 with cues to avoid terminal knee extension and to slow down eccentric return for better motor control and strengthening; BLE heel raises105# 2x12 with cues for positioning for better calf strengthening;  Seated: Seated hip adduction 3 sec hold x10 reps for better hip strengthening  Gait on treadmill 1.5- 1.0 mph with 2 HHA on treadmill x4 min with mod VCs to increase step length, improve ankle DF at heel strike for better foot clearance; patient had a harder time at the faster speed with increased toe drag; She was able to clear her foot better at the slower speed but was inconsistent;   Sit<>stand unsupported  x10 reps with mod VCs to increase forward lean for better transfer ability;Patient had a lot of difficulty requiring min A for better forward weight shiftand increased  time;  Balance: Standing on airex: Alternate toe taps on 4 inch step with 1 rail assist x12 bilaterally with mod VCs for sequencing, LE positioning and to improve weight shift and reduce UE rail assist for  Better balance challenge; Standing one foot on airex, one foot on 4 inch step with BUE ball pass side/side x10 reps each direction with min A for safety and cues to improve weight shift for less lean on parallel bars;   Patient reports moderate fatigue at end of session; She verbalized understanding in HEP exercise;                       PT Education - 01/22/17 0857    Education provided  Yes    Education Details  LE strengthening, balance, HEP reinforced;     Person(s) Educated  Patient    Methods  Explanation;Demonstration;Verbal cues    Comprehension  Verbalized understanding;Returned demonstration;Verbal cues required;Need further instruction       PT Short Term Goals - 01/18/17 1011      PT SHORT TERM GOAL #1   Title  Patient will increase 10 meter walk test to >1.28ms as to improve gait speed for better community ambulation and to reduce fall risk.    Time  4    Period  Weeks    Status  Not Met    Target Date  02/15/17      PT SHORT TERM GOAL #2   Title  Patient will be able to transfer sit<>Stand from regular height chair without pushing on arm rests to exhibit improved independence in sit<>Stand transfers and reduce fall risk;     Time  4    Period  Weeks    Status  Not Met    Target Date  02/15/17      PT SHORT TERM GOAL #3   Title  Patient will demonstrate an improved Berg Balance Score of >36/56  as to demonstrate improved balance with ADLs such as sitting/standing and transfer balance and reduced fall risk.     Time  4    Period  Weeks    Status  Not Met    Target Date  02/15/17        PT Long Term Goals - 01/18/17 1010      PT LONG TERM GOAL #1   Title  Patient will be independent in home exercise program to improve  strength/mobility for better functional independence with ADLs.    Time  4    Period  Weeks    Status  Not Met    Target Date  02/15/17      PT LONG TERM GOAL #  2   Title  Patient (> 7 years old) will complete five times sit to stand test in < 15 seconds indicating an increased LE strength and improved balance.    Time  4    Period  Weeks    Status  Not Met    Target Date  02/15/17      PT LONG TERM GOAL #3   Title  Patient will demonstrate an improved Berg Balance Score of >42/56 as to demonstrate improved balance with ADLs such as sitting/standing and transfer balance and reduced fall risk.     Time  4    Period  Weeks    Status  Not Met    Target Date  02/15/17      PT LONG TERM GOAL #4   Title  Patient will be able to negotiate a curb with LRAD, mod I exhibiting good safety awareness to improve community ambulation and reduce fall risk;     Time  4    Period  Weeks    Status  Not Met    Target Date  02/15/17      PT LONG TERM GOAL #5   Title  Patient will increase BLE gross strength to 4+/5 as to improve functional strength for independent gait, increased standing tolerance and increased ADL ability.    Time  4    Period  Weeks    Status  Not Met    Target Date  02/15/17            Plan - 01/22/17 3007    Clinical Impression Statement  Patient instructed in advanced LE strengthening and balance exercise. She was able to exhibit better toe taps with less rail assist but still requires min-mod A for safety due to poor weight shift. Patient instructed in gait on treadmill to facilitate better gait speed. She had difficulty clearing foot with increased toe drag. Patient would benefit from additional skilled PT intervention to improve strength, balance and gait safety;     Rehab Potential  Fair    Clinical Impairments Affecting Rehab Potential  negative: concerned about HEP adherence and safety awareness;     PT Frequency  2x / week    PT Duration  8 weeks    PT  Treatment/Interventions  ADLs/Self Care Home Management;Cryotherapy;Moist Heat;Gait training;Stair training;Functional mobility training;Therapeutic activities;Therapeutic exercise;Balance training;Neuromuscular re-education;Patient/family education;Energy conservation    PT Next Visit Plan  work on balance/strengthening    PT [place]  continue as given;     Consulted and Agree with Plan of Care  Patient       Patient will benefit from skilled therapeutic intervention in order to improve the following deficits and impairments:  Abnormal gait, Decreased endurance, Decreased strength, Decreased knowledge of use of DME, Decreased activity tolerance, Decreased balance, Decreased mobility, Difficulty walking, Decreased safety awareness  Visit Diagnosis: Unsteadiness on feet  Muscle weakness (generalized)     Problem List Patient Active Problem List   Diagnosis Date Noted  . Pelvic fracture ([place]) 04/18/2016  . CD (celiac disease) 08/04/2014  . Cancer of upper lobe of right lung ([place]) 08/04/2014  . Age related osteoporosis 11/26/2013  . Absolute anemia 05/21/2013  . H/O malignant neoplasm 05/21/2013  . HLD (hyperlipidemia) 05/21/2013    Elysa Womac PT, DPT 01/22/2017, 11:27 AM  [place] MAIN [facility] SERVICES [address] [place], [place], [address] Phone: [phone]   Fax:  [phone]  Name: ZAAKIRAH KISTNER MRN: [id number] Date  of Birth: 1945-07-05

## 2017-01-25 ENCOUNTER — Encounter: Payer: Self-pay | Admitting: Physical Therapy

## 2017-01-25 ENCOUNTER — Ambulatory Visit: Payer: PPO | Admitting: Physical Therapy

## 2017-01-25 DIAGNOSIS — R2681 Unsteadiness on feet: Secondary | ICD-10-CM

## 2017-01-25 DIAGNOSIS — M6281 Muscle weakness (generalized): Secondary | ICD-10-CM

## 2017-01-25 NOTE — Therapy (Signed)
[place] MAIN [facility] SERVICES [address] [place], [place], [address] Phone: [phone]   Fax:  [phone]  Physical Therapy Treatment  Patient Details  Name: Krista Evans MRN: [id number] Date of Birth: 1945/07/09 Referring Provider: [name]; following up with [name];    Encounter Date: 01/25/2017  PT End of Session - 01/25/17 0859    Visit Number  10    Number of Visits  25    Date for PT Re-Evaluation  02/15/17    Authorization Type  visits 2019: 3    PT Start Time  0850    PT Stop Time  0930    PT Time Calculation (min)  40 min    Equipment Utilized During Treatment  Gait belt    Activity Tolerance  Patient tolerated treatment well;No increased pain    Behavior During Therapy  WFL for tasks assessed/performed       Past Medical History:  Diagnosis Date  . Cancer [facility]) 05/2011   Right upper Lung CA with partial Lobectomy.  . Celiac disease     Past Surgical History:  Procedure Laterality Date  . LUNG LOBECTOMY     right lung    There were no vitals filed for this visit.  Subjective Assessment - 01/25/17 0859    Subjective  Patient reports doing well; denies any new changes; reports working on her exercises at home.     Pertinent History  72 yo Female reports falling at home and sustained multiple pelvic fractures; She had therapy at [facility] for a while and reports that she didn't get as much rehab. she reports that she didn't recover well; She was discharged home and pursued home health PT; She had a few weeks of home health PT; Patient then pursued outpatient PT at [facility] for 1-2 months. At the time of discharge (Aug 2018) she was able to walk in home without AD holding onto furniture and would use SPC when outside home. She does have a RW but doesn't like to use it. She reports increased falls in last few months. She reports she was stepping on a bug and fell backwards during the summer; She denies any  falls since she stopped therapy; Patient reports that she is still walking in her home without the cane (holding onto furniture); She reports that she is still feels unsteady; Patient was referred to [name], neurologist for unsteadiness and foot drop; She will not be following up with [name]; [name] is her PCP and she will be following up with him; Patient denies any numbness/tingling; Patient reports that she was given HEP with outpatient therapy but stopped HEP a week after stopping therapy; She reports that when weather was pretty she would walk outside some, 2x a day; She reports since the weather has changed and gotten cooler she hasn't been walking as much and has started seeing a decline;     How long can you sit comfortably?  NA    How long can you stand comfortably?  30+ min;     How long can you walk comfortably?  <100 feet;     Currently in Pain?  No/denies          TREATMENT: Warm up on Nustep BUE/BLE level 2 x46mn (unbilled): Exercise: Leg press: BLE plate105# 2x15 with cues to avoid terminal knee extension and to slow down eccentric return for better motor control and strengthening; BLE heel raises105# 2x15 with cues for positioning  for better calf strengthening;   Gait on treadmill 1.2 mph with 2 HHA on treadmill x4 min with mod VCs to increase step length, improve ankle DF at heel strike for better foot clearance; patient had a harder time at the faster speed with increased toe drag; She was able to clear her foot better at the slower speed but was inconsistent;   Balance: Standing on airex: Alternate toe taps on 4 inch step with 1 rail assist x12 bilaterally with mod VCs for sequencing, LE positioning and to improve weight shift and reduce UE rail assist for Better balance challenge; Patient had significant difficulty lifting LLE due to lack of weight shift to RLE;  Standing in front of  Mirror: Therapist to patient's right side with arm around hips,  facilitating right weight shift with LLE hip flexion x15 reps unsupported; Patient then progressed to LLE hip flexion unsupported with CGA and visual cues for lateral weight shift;  When patient shifts to right side she has no difficulty lifting LLE however she is inconsistent with falling to left side without cues;   Patient reportsmoderatefatigue at end of session; She verbalized understanding in HEP exercise;                       PT Education - 01/25/17 0859    Education provided  Yes    Education Details  LE strengthening, balance, HEP reinforced;     Person(s) Educated  Patient    Methods  Explanation;Demonstration;Verbal cues    Comprehension  Verbalized understanding;Returned demonstration;Verbal cues required;Need further instruction       PT Short Term Goals - 01/18/17 1011      PT SHORT TERM GOAL #1   Title  Patient will increase 10 meter walk test to >1.58ms as to improve gait speed for better community ambulation and to reduce fall risk.    Time  4    Period  Weeks    Status  Not Met    Target Date  02/15/17      PT SHORT TERM GOAL #2   Title  Patient will be able to transfer sit<>Stand from regular height chair without pushing on arm rests to exhibit improved independence in sit<>Stand transfers and reduce fall risk;     Time  4    Period  Weeks    Status  Not Met    Target Date  02/15/17      PT SHORT TERM GOAL #3   Title  Patient will demonstrate an improved Berg Balance Score of >36/56  as to demonstrate improved balance with ADLs such as sitting/standing and transfer balance and reduced fall risk.     Time  4    Period  Weeks    Status  Not Met    Target Date  02/15/17        PT Long Term Goals - 01/18/17 1010      PT LONG TERM GOAL #1   Title  Patient will be independent in home exercise program to improve strength/mobility for better functional independence with ADLs.    Time  4    Period  Weeks    Status  Not Met     Target Date  02/15/17      PT LONG TERM GOAL #2   Title  Patient (> 69years old) will complete five times sit to stand test in < 15 seconds indicating an increased LE strength and improved balance.    Time  4    Period  Weeks    Status  Not Met    Target Date  02/15/17      PT LONG TERM GOAL #3   Title  Patient will demonstrate an improved Berg Balance Score of >42/56 as to demonstrate improved balance with ADLs such as sitting/standing and transfer balance and reduced fall risk.     Time  4    Period  Weeks    Status  Not Met    Target Date  02/15/17      PT LONG TERM GOAL #4   Title  Patient will be able to negotiate a curb with LRAD, mod I exhibiting good safety awareness to improve community ambulation and reduce fall risk;     Time  4    Period  Weeks    Status  Not Met    Target Date  02/15/17      PT LONG TERM GOAL #5   Title  Patient will increase BLE gross strength to 4+/5 as to improve functional strength for independent gait, increased standing tolerance and increased ADL ability.    Time  4    Period  Weeks    Status  Not Met    Target Date  02/15/17            Plan - 01/25/17 1443    Clinical Impression Statement  Advanced LE strengthening exercise with increased repetition. Patient is still very slow with exercise requiring increased time to complete tasks. She reports increased fatigue at end of session; Patient instructed in dynamic balance exercise to facilitate better gait ability including 4 square/walking on treadmill, tandem walk; She requires min A for safety and cues to improve weight shift/upper trunk control for better balance. She would benefit from additional skilled PT intervention to improve strength, balance and gait safety;     Rehab Potential  Fair    Clinical Impairments Affecting Rehab Potential  negative: concerned about HEP adherence and safety awareness;     PT Frequency  2x / week    PT Duration  8 weeks    PT Treatment/Interventions   ADLs/Self Care Home Management;Cryotherapy;Moist Heat;Gait training;Stair training;Functional mobility training;Therapeutic activities;Therapeutic exercise;Balance training;Neuromuscular re-education;Patient/family education;Energy conservation    PT Next Visit Plan  work on balance/strengthening    PT [place]  continue as given;     Consulted and Agree with Plan of Care  Patient       Patient will benefit from skilled therapeutic intervention in order to improve the following deficits and impairments:  Abnormal gait, Decreased endurance, Decreased strength, Decreased knowledge of use of DME, Decreased activity tolerance, Decreased balance, Decreased mobility, Difficulty walking, Decreased safety awareness  Visit Diagnosis: Unsteadiness on feet  Muscle weakness (generalized)     Problem List Patient Active Problem List   Diagnosis Date Noted  . Pelvic fracture ([place]) 04/18/2016  . CD (celiac disease) 08/04/2014  . Cancer of upper lobe of right lung ([place]) 08/04/2014  . Age related osteoporosis 11/26/2013  . Absolute anemia 05/21/2013  . H/O malignant neoplasm 05/21/2013  . HLD (hyperlipidemia) 05/21/2013    Siedah Sedor PT, DPT 01/25/2017, 12:20 PM  [place] MAIN [facility] SERVICES [address] [place], [place], [address] Phone: [phone]   Fax:  [phone]  Name: Krista Evans MRN: [id number] Date of Birth: 1945/10/24

## 2017-01-30 ENCOUNTER — Ambulatory Visit: Payer: PPO | Admitting: Physical Therapy

## 2017-02-01 ENCOUNTER — Ambulatory Visit: Payer: PPO | Admitting: Physical Therapy

## 2017-02-06 ENCOUNTER — Ambulatory Visit: Payer: PPO | Admitting: Physical Therapy

## 2017-02-13 ENCOUNTER — Ambulatory Visit: Payer: PPO | Admitting: Physical Therapy

## 2017-02-13 ENCOUNTER — Encounter: Payer: Self-pay | Admitting: Physical Therapy

## 2017-02-13 DIAGNOSIS — M6281 Muscle weakness (generalized): Secondary | ICD-10-CM

## 2017-02-13 DIAGNOSIS — R2681 Unsteadiness on feet: Secondary | ICD-10-CM

## 2017-02-13 NOTE — Therapy (Signed)
[place] MAIN [facility] SERVICES [address] [place], [place], [address] Phone: [phone]   Fax:  [phone]  Physical Therapy Treatment/Progress Note  Patient Details  Name: Krista Evans MRN: [id number] Date of Birth: November 28, 1945 Referring Provider: [name]; following up with [name];    Encounter Date: 02/13/2017  PT End of Session - 02/13/17 0947    Visit Number  11    Number of Visits  25    Date for PT Re-Evaluation  02/15/17    Authorization Type  visits 2019: 4    PT Start Time  0943    PT Stop Time  1030    PT Time Calculation (min)  47 min    Equipment Utilized During Treatment  Gait belt    Activity Tolerance  Patient tolerated treatment well;No increased pain    Behavior During Therapy  WFL for tasks assessed/performed       Past Medical History:  Diagnosis Date  . Cancer [facility]) 05/2011   Right upper Lung CA with partial Lobectomy.  . Celiac disease     Past Surgical History:  Procedure Laterality Date  . LUNG LOBECTOMY     right lung    There were no vitals filed for this visit.  Subjective Assessment - 02/13/17 0946    Subjective  Patient reports doing well; She denies any pain; She reports feeling better after missing a few weeks due to sickness. She reports doing some exercise but states she hasn't been able to do a lot.     Pertinent History  72 yo Female reports falling at home and sustained multiple pelvic fractures; She had therapy at [facility] place for a while and reports that she didn't get as much rehab. she reports that she didn't recover well; She was discharged home and pursued home health PT; She had a few weeks of home health PT; Patient then pursued outpatient PT at [facility] for 1-2 months. At the time of discharge (Aug 2018) she was able to walk in home without AD holding onto furniture and would use SPC when outside home. She does have a RW but doesn't like to use it. She reports increased  falls in last few months. She reports she was stepping on a bug and fell backwards during the summer; She denies any falls since she stopped therapy; Patient reports that she is still walking in her home without the cane (holding onto furniture); She reports that she is still feels unsteady; Patient was referred to [name], neurologist for unsteadiness and foot drop; She will not be following up with [name]; [name] is her PCP and she will be following up with him; Patient denies any numbness/tingling; Patient reports that she was given HEP with outpatient therapy but stopped HEP a week after stopping therapy; She reports that when weather was pretty she would walk outside some, 2x a day; She reports since the weather has changed and gotten cooler she hasn't been walking as much and has started seeing a decline;     How long can you sit comfortably?  NA    How long can you stand comfortably?  30+ min;     How long can you walk comfortably?  <100 feet;     Currently in Pain?  No/denies         [facility] PT Assessment - 02/13/17 0001      Standardized Balance Assessment   Five times sit to stand comments  36 sec with 1 HHA on chair, (>15 sec indicates increased risk for falls, more impaired than 01/02/18 which was 32 sec with 1 HHA)    10 Meter Walk  0.8 m/s without AD, limited home/community ambulator, no significant change from 01/02/18 which was 0.77 m/s      Berg Balance Test   Sit to Stand  Able to stand  independently using hands    Standing Unsupported  Able to stand 2 minutes with supervision    Sitting with Back Unsupported but Feet Supported on Floor or Stool  Able to sit safely and securely 2 minutes    Stand to Sit  Controls descent by using hands    Transfers  Able to transfer safely, definite need of hands    Standing Unsupported with Eyes Closed  Able to stand 10 seconds with supervision    Standing Ubsupported with Feet Together  Able to place feet together independently and  stand for 1 minute with supervision    From Standing, Reach Forward with Outstretched Arm  Can reach forward >12 cm safely (5")    From Standing Position, Pick up Object from [place] to pick up shoe, needs supervision    From Standing Position, Turn to Look Behind Over each Shoulder  Looks behind from both sides and weight shifts well    Turn 360 Degrees  Needs assistance while turning    Standing Unsupported, Alternately Place Feet on Step/Stool  Needs assistance to keep from falling or unable to try    Standing Unsupported, One Foot in [employer] balance while stepping or standing    Standing on One Leg  Unable to try or needs assist to prevent fall    Total Score  32    Berg comment:  <36/56 indicates high fall risk, no change from 01/02/18 which was 32/56      TREATMENT: Warm up on Nustep BUE/BLE level 2 x4 min (Unbilled); PT instructed patient in 10 meter walk, 5 times sit<>Stand and Berg Balance assessment to address goals, see above;   Leg press: BLE plate 90# 7C58 with cues to slow down LE movement for better motor control and strengthening;  BLE heel raises 90# x12 with cues for positioning for better calf strengthening;  Standing with green tband around BLE: Hip abduction x10 bilaterally; HIp extension x10 bilaterally; Patient required min-moderate verbal/tactile cues for correct exercise technique including cues to increase ROM and avoid trunk lean for better strengthening;  Standing alternate march x10 reps with 1 rail assist with visual and verbal cues to increase ROM for better hip flexion and better balance control; Patient had significant difficulty lifting RLE due to LLE weakness and poor stance control;                    PT Education - 02/13/17 0947    Education provided  Yes    Education Details  progress towards goals, recommendations, HEP reinforced;     Person(s) Educated  Patient    Methods  Explanation;Demonstration;Verbal cues     Comprehension  Returned demonstration;Verbalized understanding;Verbal cues required;Need further instruction       PT Short Term Goals - 02/13/17 1004      PT SHORT TERM GOAL #1   Title  Patient will increase 10 meter walk test to >1.25ms as to improve gait speed for better community ambulation and to reduce fall risk.    Time  4    Period  Weeks  Status  Not Met    Target Date  02/15/17      PT SHORT TERM GOAL #2   Title  Patient will be able to transfer sit<>Stand from regular height chair without pushing on arm rests to exhibit improved independence in sit<>Stand transfers and reduce fall risk;     Time  4    Period  Weeks    Status  Not Met    Target Date  02/15/17      PT SHORT TERM GOAL #3   Title  Patient will demonstrate an improved Berg Balance Score of >36/56  as to demonstrate improved balance with ADLs such as sitting/standing and transfer balance and reduced fall risk.     Time  4    Period  Weeks    Status  Not Met    Target Date  02/15/17        PT Long Term Goals - 02/13/17 1005      PT LONG TERM GOAL #1   Title  Patient will be independent in home exercise program to improve strength/mobility for better functional independence with ADLs.    Time  4    Period  Weeks    Status  Not Met    Target Date  02/15/17      PT LONG TERM GOAL #2   Title  Patient (> 19 years old) will complete five times sit to stand test in < 15 seconds indicating an increased LE strength and improved balance.    Time  4    Period  Weeks    Status  Not Met    Target Date  02/15/17      PT LONG TERM GOAL #3   Title  Patient will demonstrate an improved Berg Balance Score of >42/56 as to demonstrate improved balance with ADLs such as sitting/standing and transfer balance and reduced fall risk.     Time  4    Period  Weeks    Status  Not Met    Target Date  02/15/17      PT LONG TERM GOAL #4   Title  Patient will be able to negotiate a curb with LRAD, mod I exhibiting good  safety awareness to improve community ambulation and reduce fall risk;     Time  4    Period  Weeks    Status  Not Met    Target Date  02/15/17      PT LONG TERM GOAL #5   Title  Patient will increase BLE gross strength to 4+/5 as to improve functional strength for independent gait, increased standing tolerance and increased ADL ability.    Time  4    Period  Weeks    Status  Not Met    Target Date  02/15/17            Plan - 02/13/17 1016    Clinical Impression Statement  Patient has missed 2 weeks of skilled PT Intervention due to sickness. She reports lack of adherence to her HEP. PT assessed progress towards goals. She exhibits no change in balance or gait/transfer ability. Patient expressed that she tries to do her HEP but states that sometimes she just doesn't get it done. PT educated patient in different options including supervised forever fit class. Patient would benefit from additional skilled PT intervention to advance HEP and discuss discharge planning for transition to forever fit class;     Rehab Potential  Fair    Clinical  Impairments Affecting Rehab Potential  negative: concerned about HEP adherence and safety awareness;     PT Frequency  2x / week    PT Duration  8 weeks    PT Treatment/Interventions  ADLs/Self Care Home Management;Cryotherapy;Moist Heat;Gait training;Stair training;Functional mobility training;Therapeutic activities;Therapeutic exercise;Balance training;Neuromuscular re-education;Patient/family education;Energy conservation    PT Next Visit Plan  work on balance/strengthening    PT [place]  continue as given;     Consulted and Agree with Plan of Care  Patient       Patient will benefit from skilled therapeutic intervention in order to improve the following deficits and impairments:  Abnormal gait, Decreased endurance, Decreased strength, Decreased knowledge of use of DME, Decreased activity tolerance, Decreased balance, Decreased  mobility, Difficulty walking, Decreased safety awareness  Visit Diagnosis: Unsteadiness on feet  Muscle weakness (generalized)     Problem List Patient Active Problem List   Diagnosis Date Noted  . Pelvic fracture ([place]) 04/18/2016  . CD (celiac disease) 08/04/2014  . Cancer of upper lobe of right lung ([place]) 08/04/2014  . Age related osteoporosis 11/26/2013  . Absolute anemia 05/21/2013  . H/O malignant neoplasm 05/21/2013  . HLD (hyperlipidemia) 05/21/2013    Donita Newland PT, DPT 02/13/2017, 12:34 PM  [place] MAIN [facility] SERVICES [address] [place], [place], [address] Phone: [phone]   Fax:  [phone]  Name: Krista Evans MRN: [id number] Date of Birth: [birth year]

## 2017-02-15 ENCOUNTER — Encounter: Payer: Self-pay | Admitting: Physical Therapy

## 2017-02-15 ENCOUNTER — Ambulatory Visit: Payer: PPO | Admitting: Physical Therapy

## 2017-02-15 DIAGNOSIS — R2681 Unsteadiness on feet: Secondary | ICD-10-CM

## 2017-02-15 DIAGNOSIS — M6281 Muscle weakness (generalized): Secondary | ICD-10-CM

## 2017-02-15 NOTE — Therapy (Signed)
[place] MAIN [facility] SERVICES [address] [place], [place], [address] Phone: [phone]   Fax:  [phone]  Physical Therapy Treatment/Discharge Summary  Patient Details  Name: Krista Evans MRN: [id number] Date of Birth: Mar 28, 1945 Referring Provider: [name]; following up with [name];    Encounter Date: 02/15/2017  PT End of Session - 02/15/17 1118    Visit Number  12    Number of Visits  25    Date for PT Re-Evaluation  02/15/17    Authorization Type  visits 2019: 5    PT Start Time  1112    PT Stop Time  1155    PT Time Calculation (min)  43 min    Equipment Utilized During Treatment  Gait belt    Activity Tolerance  Patient tolerated treatment well;No increased pain    Behavior During Therapy  WFL for tasks assessed/performed       Past Medical History:  Diagnosis Date  . Cancer [facility]) 05/2011   Right upper Lung CA with partial Lobectomy.  . Celiac disease     Past Surgical History:  Procedure Laterality Date  . LUNG LOBECTOMY     right lung    There were no vitals filed for this visit.  Subjective Assessment - 02/15/17 1117    Subjective  patient reports doing well; She denies any pain; Reports no new falls; "I just feel weak today."     Pertinent History  72 yo Female reports falling at home and sustained multiple pelvic fractures; She had therapy at [facility] for a while and reports that she didn't get as much rehab. she reports that she didn't recover well; She was discharged home and pursued home health PT; She had a few weeks of home health PT; Patient then pursued outpatient PT at [facility] for 1-2 months. At the time of discharge (Aug 2018) she was able to walk in home without AD holding onto furniture and would use SPC when outside home. She does have a RW but doesn't like to use it. She reports increased falls in last few months. She reports she was stepping on a bug and fell backwards during the  summer; She denies any falls since she stopped therapy; Patient reports that she is still walking in her home without the cane (holding onto furniture); She reports that she is still feels unsteady; Patient was referred to [name], neurologist for unsteadiness and foot drop; She will not be following up with [name]; [name] is her PCP and she will be following up with him; Patient denies any numbness/tingling; Patient reports that she was given HEP with outpatient therapy but stopped HEP a week after stopping therapy; She reports that when weather was pretty she would walk outside some, 2x a day; She reports since the weather has changed and gotten cooler she hasn't been walking as much and has started seeing a decline;     How long can you sit comfortably?  NA    How long can you stand comfortably?  30+ min;     How long can you walk comfortably?  <100 feet;     Currently in Pain?  No/denies                TREATMENT: Warm up on Nustep BUE/BLE level 2 x67mn (unbilled):  Exercise: Leg press: BLE plate105# 2x15 with cues to avoid terminal knee extension and to slow down eccentric return for better motor control  and strengthening; BLE heel raises105# x15 with cues for positioning for better calf strengthening;  Standing with red tband around BLE: Hip extension x15 bilaterally with cues to keep knee straight for better gluteal activation;  Side step over 1/2 bolster with red tband x5 reps each direction with 2 Rail assist with cues to increase step length for better foot clearance;    Balance:  Standing on 1/2 bolster: Heel/toe rock x15 with rail assist for balance; Feet in neutral, BUE arms overhead lift x10 reps with min A for safety and cues to improve ankle strategies for better balance/stance control;  Standing on airex: -modified tandem stance: BUE ball up/down unsupported standing x5 reps with each foot in front with min A for safety and cues to improve weight  shift through LE for better stance control; -feet slightly apart, BUE ball pass side/side x5 reps each direction with cues to keep both hands on ball to facilitate better trunk rotation and min A for stance control;  Patient reportsmoderatefatigue at end of session; She verbalized understanding in HEP exercise; Educated patient on transition to forever fit class;                 PT Education - 02/15/17 1118    Education provided  Yes    Education Details  LE strengthening, balance exercise, HEP reinforced;     Person(s) Educated  Patient    Methods  Explanation;Demonstration;Verbal cues    Comprehension  Verbalized understanding;Returned demonstration;Verbal cues required;Need further instruction       PT Short Term Goals - 02/13/17 1004      PT SHORT TERM GOAL #1   Title  Patient will increase 10 meter walk test to >1.36ms as to improve gait speed for better community ambulation and to reduce fall risk.    Time  4    Period  Weeks    Status  Not Met    Target Date  02/15/17      PT SHORT TERM GOAL #2   Title  Patient will be able to transfer sit<>Stand from regular height chair without pushing on arm rests to exhibit improved independence in sit<>Stand transfers and reduce fall risk;     Time  4    Period  Weeks    Status  Not Met    Target Date  02/15/17      PT SHORT TERM GOAL #3   Title  Patient will demonstrate an improved Berg Balance Score of >36/56  as to demonstrate improved balance with ADLs such as sitting/standing and transfer balance and reduced fall risk.     Time  4    Period  Weeks    Status  Not Met    Target Date  02/15/17        PT Long Term Goals - 02/13/17 1005      PT LONG TERM GOAL #1   Title  Patient will be independent in home exercise program to improve strength/mobility for better functional independence with ADLs.    Time  4    Period  Weeks    Status  Not Met    Target Date  02/15/17      PT LONG TERM GOAL #2   Title   Patient (> 626years old) will complete five times sit to stand test in < 15 seconds indicating an increased LE strength and improved balance.    Time  4    Period  Weeks    Status  Not  Met    Target Date  02/15/17      PT LONG TERM GOAL #3   Title  Patient will demonstrate an improved Berg Balance Score of >42/56 as to demonstrate improved balance with ADLs such as sitting/standing and transfer balance and reduced fall risk.     Time  4    Period  Weeks    Status  Not Met    Target Date  02/15/17      PT LONG TERM GOAL #4   Title  Patient will be able to negotiate a curb with LRAD, mod I exhibiting good safety awareness to improve community ambulation and reduce fall risk;     Time  4    Period  Weeks    Status  Not Met    Target Date  02/15/17      PT LONG TERM GOAL #5   Title  Patient will increase BLE gross strength to 4+/5 as to improve functional strength for independent gait, increased standing tolerance and increased ADL ability.    Time  4    Period  Weeks    Status  Not Met    Target Date  02/15/17            Plan - 02/15/17 1140    Clinical Impression Statement  Patient instructed in advanced LE strengthening exercise today; She does require cues to increase ROM for better hip strengthening and to improve foot clearance; Patient instructed in advanced balance exercise. She continues to have difficulty standing on compliant surfaces with less rail assist. She requires min A for balance control and cues for better postural support/weight shift. Patient has receieved 8+ weeks of skilled PT intervention. She shows no significant change in balance/strength likely due to lack of adherence to HEP. recommend patient be transitioned to forever fit supervised exercise class for patient continue working towards goals. Patient agreeable. Will disharge her at this time so that she can pursue supervised exercise class;     Rehab Potential  Fair    Clinical Impairments Affecting Rehab  Potential  negative: concerned about HEP adherence and safety awareness;     PT Frequency  2x / week    PT Duration  8 weeks    PT Treatment/Interventions  ADLs/Self Care Home Management;Cryotherapy;Moist Heat;Gait training;Stair training;Functional mobility training;Therapeutic activities;Therapeutic exercise;Balance training;Neuromuscular re-education;Patient/family education;Energy conservation    PT Next Visit Plan  work on balance/strengthening    PT [place]  continue as given;     Consulted and Agree with Plan of Care  Patient       Patient will benefit from skilled therapeutic intervention in order to improve the following deficits and impairments:  Abnormal gait, Decreased endurance, Decreased strength, Decreased knowledge of use of DME, Decreased activity tolerance, Decreased balance, Decreased mobility, Difficulty walking, Decreased safety awareness  Visit Diagnosis: Unsteadiness on feet  Muscle weakness (generalized)     Problem List Patient Active Problem List   Diagnosis Date Noted  . Pelvic fracture ([place]) 04/18/2016  . CD (celiac disease) 08/04/2014  . Cancer of upper lobe of right lung ([place]) 08/04/2014  . Age related osteoporosis 11/26/2013  . Absolute anemia 05/21/2013  . H/O malignant neoplasm 05/21/2013  . HLD (hyperlipidemia) 05/21/2013    Tyauna Lacaze,MargaretPT, DPT 02/15/2017, 11:44 AM  [place] MAIN [facility] SERVICES [address] [place], [place], [address] Phone: [phone]   Fax:  [phone]  Name: KALI DEADWYLER MRN: [id number] Date of Birth: 1945/10/02

## 2017-02-20 ENCOUNTER — Ambulatory Visit: Payer: PPO | Admitting: Physical Therapy

## 2017-02-22 ENCOUNTER — Ambulatory Visit: Payer: PPO | Admitting: Physical Therapy

## 2017-02-27 ENCOUNTER — Ambulatory Visit: Payer: PPO | Admitting: Physical Therapy

## 2017-03-01 ENCOUNTER — Ambulatory Visit: Payer: PPO | Admitting: Physical Therapy

## 2017-03-12 DIAGNOSIS — E7849 Other hyperlipidemia: Secondary | ICD-10-CM | POA: Diagnosis not present

## 2017-03-12 DIAGNOSIS — D649 Anemia, unspecified: Secondary | ICD-10-CM | POA: Diagnosis not present

## 2017-03-14 ENCOUNTER — Other Ambulatory Visit: Payer: Self-pay | Admitting: Internal Medicine

## 2017-03-14 DIAGNOSIS — I700 Atherosclerosis of aorta: Secondary | ICD-10-CM | POA: Diagnosis not present

## 2017-03-14 DIAGNOSIS — R531 Weakness: Secondary | ICD-10-CM | POA: Diagnosis not present

## 2017-03-14 DIAGNOSIS — M810 Age-related osteoporosis without current pathological fracture: Secondary | ICD-10-CM | POA: Diagnosis not present

## 2017-03-14 DIAGNOSIS — Z85118 Personal history of other malignant neoplasm of bronchus and lung: Secondary | ICD-10-CM

## 2017-03-14 DIAGNOSIS — Z8781 Personal history of (healed) traumatic fracture: Secondary | ICD-10-CM | POA: Diagnosis not present

## 2017-03-14 DIAGNOSIS — D649 Anemia, unspecified: Secondary | ICD-10-CM | POA: Diagnosis not present

## 2017-03-14 DIAGNOSIS — E7849 Other hyperlipidemia: Secondary | ICD-10-CM | POA: Diagnosis not present

## 2017-03-14 DIAGNOSIS — K900 Celiac disease: Secondary | ICD-10-CM | POA: Diagnosis not present

## 2017-03-15 ENCOUNTER — Other Ambulatory Visit: Payer: Self-pay | Admitting: Internal Medicine

## 2017-03-15 DIAGNOSIS — R531 Weakness: Secondary | ICD-10-CM

## 2017-03-15 DIAGNOSIS — Z85118 Personal history of other malignant neoplasm of bronchus and lung: Secondary | ICD-10-CM

## 2017-03-16 ENCOUNTER — Ambulatory Visit: Admission: RE | Admit: 2017-03-16 | Payer: PPO | Source: Ambulatory Visit

## 2017-03-17 ENCOUNTER — Ambulatory Visit
Admission: RE | Admit: 2017-03-17 | Discharge: 2017-03-17 | Disposition: A | Discharge status: Home / Self Care | Payer: PPO | Source: Ambulatory Visit | Attending: Internal Medicine | Admitting: Internal Medicine

## 2017-03-17 DIAGNOSIS — R531 Weakness: Secondary | ICD-10-CM

## 2017-03-17 DIAGNOSIS — Z85118 Personal history of other malignant neoplasm of bronchus and lung: Secondary | ICD-10-CM | POA: Diagnosis present

## 2017-03-17 DIAGNOSIS — R9089 Other abnormal findings on diagnostic imaging of central nervous system: Secondary | ICD-10-CM | POA: Insufficient documentation

## 2017-03-17 DIAGNOSIS — D320 Benign neoplasm of cerebral meninges: Secondary | ICD-10-CM | POA: Diagnosis not present

## 2017-03-17 DIAGNOSIS — G9389 Other specified disorders of brain: Secondary | ICD-10-CM | POA: Insufficient documentation

## 2017-03-17 DIAGNOSIS — E7849 Other hyperlipidemia: Secondary | ICD-10-CM | POA: Diagnosis present

## 2017-03-17 MED ORDER — GADOBENATE DIMEGLUMINE 529 MG/ML IV SOLN
13.0000 mL | Freq: Once | INTRAVENOUS | Status: AC | PRN
  Administered 2017-03-17: 13 mL via INTRAVENOUS

## 2017-04-10 DIAGNOSIS — D649 Anemia, unspecified: Secondary | ICD-10-CM | POA: Diagnosis not present

## 2017-04-10 DIAGNOSIS — M810 Age-related osteoporosis without current pathological fracture: Secondary | ICD-10-CM | POA: Diagnosis not present

## 2017-04-10 DIAGNOSIS — E7849 Other hyperlipidemia: Secondary | ICD-10-CM | POA: Diagnosis not present

## 2017-04-10 DIAGNOSIS — I700 Atherosclerosis of aorta: Secondary | ICD-10-CM | POA: Diagnosis not present

## 2017-04-10 DIAGNOSIS — K900 Celiac disease: Secondary | ICD-10-CM | POA: Diagnosis not present

## 2017-04-10 DIAGNOSIS — Z85118 Personal history of other malignant neoplasm of bronchus and lung: Secondary | ICD-10-CM | POA: Diagnosis not present

## 2017-04-10 DIAGNOSIS — Z8781 Personal history of (healed) traumatic fracture: Secondary | ICD-10-CM | POA: Diagnosis not present

## 2017-04-10 DIAGNOSIS — D329 Benign neoplasm of meninges, unspecified: Secondary | ICD-10-CM | POA: Diagnosis not present

## 2017-05-30 ENCOUNTER — Encounter: Payer: Self-pay | Admitting: Neurology

## 2017-05-30 ENCOUNTER — Ambulatory Visit (INDEPENDENT_AMBULATORY_CARE_PROVIDER_SITE_OTHER): Payer: PPO | Admitting: Neurology

## 2017-05-30 VITALS — BP 153/76 | HR 73 | Ht 65.0 in | Wt 126.0 lb

## 2017-05-30 DIAGNOSIS — G912 (Idiopathic) normal pressure hydrocephalus: Secondary | ICD-10-CM | POA: Diagnosis not present

## 2017-05-30 NOTE — Progress Notes (Signed)
Subjective:    Patient ID: Krista Evans is a 72 y.o. female.  HPI     History:   Dear Dr. Caryl Comes,   I saw your patient, Krista Evans, upon your kind request in my neurologic clinic today for initial consultation of her gait disorder, concern for NPH. The patient is accompanied by her husband and her son today. As you know, [name] is a 72 year old right-handed woman with an underlying medical history of lung cancer status post partial lobectomy, celiac disease managed with gluten-free diet, fall with pelvic fracture in April 2018 with subsequent inpatient rehabilitation, who has had a gait disorder for the past few years, about 3-4 years, per son. Gait disorder was gradual in onset, he noted that she started walking more wider based on slowly and cautiously. Up until her fall last year she was able to walk independently however. Her fall made her gait disorder much worse, this was a big setback for her. Her husband adds that she lost confidence and she is afraid of falling. I reviewed your office note from 04/10/2017, which you kindly included. She was seen at [facility] clinic neurology in October 2018. I reviewed the note. She has been in physical therapy. She recently had a brain MRI with and without contrast on 03/17/2017, I reviewed the results, including the report and images through the PACS system: IMPRESSION: 1. No acute or subacute infarct. Moderate for age cerebral white matter signal changes which are most commonly due to chronic small vessel disease. But no prior cortical infarct is identified, and no prior ischemia identified in the deep gray matter nuclei, brainstem, or cerebellum. 2. Lateral and third ventriculomegaly is nonspecific but favored to be ex vacuo in nature. In the appropriate clinical setting consider normal pressure hydrocephalus. 3. Subtle right frontal convexity meningioma, appears inconsequential. 4.  No metastatic disease identified.  She does not  endorse much in the way of memory loss. She has had some slurring of speech from time to time, some slowness in speech, denies any bladder dysfunction, no incontinence reported. She has not had any serious or recent falls but has had a couple of falls since her significant fall with pelvic fracture last year. Her husband reports that she slipped one time and had a forceful sitting down event.  She is fearful of falling. She has to either hold onto someone or uses her walker for shorter distances but feels insecure. When she was in physical therapy last year she did not feel she made any progress. Her husband feels that she had regressed after that. She lives with her husband in a single story home but there are 3 steps to get inside the house. She has a daughter who is not involved in the care of his far as I understand. She quit smoking in 1988, she does not utilize alcohol on a regular basis, she drinks some caffeine.   Her Past Medical History Is Significant For: Past Medical History:  Diagnosis Date  . Abnormal Q waves on electrocardiogram   . Anemia   . Aortic atherosclerosis ([place])   . Cancer [facility]) 05/2011   Right upper Lung CA with partial Lobectomy.  . Celiac disease   . Celiac disease   . Hyperlipidemia   . Hyperlipidemia   . Lung mass   . Meningioma ([place])   . Osteoporosis     Her Past Surgical History Is Significant For: Past Surgical History:  Procedure Laterality Date  . LUNG LOBECTOMY  right lung    Her Family History Is Significant For: Family History  Problem Relation Age of Onset  . Breast cancer Cousin 102  . CAD Sister   . Asthma Mother   . Heart disease Mother   . Hypertension Mother   . COPD Father   . Diabetes Maternal Grandmother   . Diabetes Maternal Grandfather     Her Social History Is Significant For: Social History   Socioeconomic History  . Marital status: Married    Spouse name: Not on file  . Number of children: Not on file  . Years of  education: Not on file  . Highest education level: Not on file  Occupational History  . Not on file  Social Needs  . Financial resource strain: Not on file  . Food insecurity:    Worry: Not on file    Inability: Not on file  . Transportation needs:    Medical: Not on file    Non-medical: Not on file  Tobacco Use  . Smoking status: Former Smoker    Types: Cigarettes    Last attempt to quit: 02/28/1986    Years since quitting: 31.2  . Smokeless tobacco: Never Used  Substance and Sexual Activity  . Alcohol use: No    Alcohol/week: 0.0 oz  . Drug use: No  . Sexual activity: Not on file  Lifestyle  . Physical activity:    Days per week: Not on file    Minutes per session: Not on file  . Stress: Not on file  Relationships  . Social connections:    Talks on phone: Not on file    Gets together: Not on file    Attends religious service: Not on file    Active member of club or organization: Not on file    Attends meetings of clubs or organizations: Not on file    Relationship status: Not on file  Other Topics Concern  . Not on file  Social History Narrative   Lives at home with husband    Her Allergies Are:  Allergies  Allergen Reactions  . Barley Grass Other (See Comments)    And rye Doesn't absorb in small intestines  . Gluten Meal   . Wheat Bran Other (See Comments)    Doesn't absorb in small intestines  :   Her Current Medications Are:  Outpatient Encounter Medications as of 05/30/2017  Medication Sig  . aspirin EC 81 MG tablet Take 81 mg daily by mouth.  . calcium carbonate (TUMS - DOSED IN MG ELEMENTAL CALCIUM) 500 MG chewable tablet Chew 1 tablet by mouth daily.  . Cholecalciferol 1000 UNITS tablet Take 2,000 Units by mouth.   . Multiple Vitamin (MULTI-VITAMINS) TABS Take by mouth.  . [DISCONTINUED] alendronate (FOSAMAX) 70 MG tablet Take 70 mg by mouth once a week.  . [DISCONTINUED] calcium carbonate (OS-CAL) 600 MG TABS tablet Take by mouth.  .  [DISCONTINUED] clobetasol cream (TEMOVATE) 0.05 %   . [DISCONTINUED] oxyCODONE-acetaminophen (PERCOCET/ROXICET) 5-325 MG tablet Take 1-2 tablets by mouth every 6 (six) hours as needed for moderate pain or severe pain.   No facility-administered encounter medications on file as of 05/30/2017.   :  Review of Systems:  Out of a complete 14 point review of systems, all are reviewed and negative with the exception of these symptoms as listed below: Review of Systems  Neurological:       Pt presents today to discuss her weakness. Pt had a fall in April of  2018 with a subsequent fractured pelvis. She completed rehab. However, pt has gotten progressively weaker. Pt is in a wheelchair. Pt denies dizziness or lightheadedness upon standing.    Objective:  Neurological Exam  Physical Exam Physical Examination:   Vitals:   05/30/17 1008  BP: (!) 153/76  Pulse: 73   General Examination: The patient is a very pleasant 72 y.o. female in no acute distress. She thin and somewhat frail appearing. She is well groomed. She is situated in her wheelchair.   HEENT: Normocephalic, atraumatic, pupils are equal, round and reactive to light and accommodation. Fshe wears corrective eyeglasses. Face is symmetric, speech is not dysarthric but slow. Extraocular tracking shows mild difficulty. Hearing is grossly intact, neck is supple, she has no lip, neck or jaw tremor. Airway examination reveals mild mouth dryness, otherwise nonfocal findings. Tongue protrudes centrally and palate elevates symmetrically.  Chest: Clear to auscultation without wheezing, rhonchi or crackles noted.  Heart: S1+S2+0, regular and normal without murmurs, rubs or gallops noted.   Abdomen: Soft, non-tender and non-distended with normal bowel sounds appreciated on auscultation.  Extremities: There is no pitting edema in the distal lower extremities bilaterally.  Skin: Warm and dry without trophic changes noted.  Musculoskeletal: exam  reveals no obvious joint deformities, tenderness or joint swelling or erythema.   Neurologically:  Mental status: The patient is awake, alert and oriented in all 4 spheres. Her immediate and remote memory, attention, language skills and fund of knowledge are fairly appropriate. Speech is slow, maybe some slowness in thinking.   Cranial nerves II - XII are as described above under HEENT exam. In addition: shoulder shrug is normal with equal shoulder height noted. Motor exam: thin bulk, global strength of 4+5, no resting or postural tremor, tone is normal. Romberg is not tested due to safety concerns. Reflexes are 1+ throughout. Fine motor skills are globally mildly impaired. On cerebellar testing she has no obvious dysmetria or intention tremor. Sensory exam: intact to light touch in the upper and lower extremities.  Gait, station and balance: She stands with difficulty and has to push herself up from the wheelchair but also requires assistance which is provided by her son. She leans onto him and hold onto his right upper arm with both her hands. She walks wide-based stance is wide-based. She does not lift her feet, she does have a magnetic appearing gait, she is insecure, she is fearful of falling. She is not able to walk more than a few steps. She has difficulty getting back into her wheelchair as she has difficulty backing up.  Assessment and Plan:   In summary, TIARE ROHLMAN is a very pleasant 72 y.o.-year old female with an underlying medical history of lung cancer status post partial lobectomy, celiac disease managed with gluten-free diet, fall with pelvic fracture in April 2018 with subsequent inpatient rehabilitation, who presents for neurologic consultation of her gait disorder and concern for NPH. Her history and physical examination, her neurological examination in particular, along with her brain MRI findings could be supportive of normal pressure hydrocephalus. I had an extended discussion  of over 1 hour with the patient and her family regarding this diagnosis. I explained that there is no single diagnostic test for this or definitive test for this condition. Her gait disorder has been gradual in onset and certainly culminated in her serious fall last year with injury. This in and of itself has been topics that back for her and she had an extended rehabilitation after  that. Having fallen is also an independent risk factor for falls. She has lost confidence. She is quite insecure in her walking today and certainly required maximum assistance with a single person. I am not sure that she would be even stable enough to use a walker independently but around the house she apparently is able to use her rolling walker to some degree. I talked to him about further testing in the form of large volume spinal tap with before and after gait evaluation with the help of physical therapy. We would be happy to arrange for these and coordinate her spinal tap with physical therapy. I also talked to them about treatment in the form of shunt placement, which would require neurosurgical consultation. I encouraged the patient and her family to discuss this and let me know how they would like to proceed.  I answered all their numerous and very appropriate questions today and questions today and suggested that they call me or email me as to how she would like to proceed. I will see her back as needed. We talked at length about fall risk and gait safety today.  Thank you very much for allowing me to participate in the care of this nice patient. If I can be of any further assistance to you please do not hesitate to call me at [phone].  Sincerely,   Star Age, MD, PhD

## 2017-05-30 NOTE — Patient Instructions (Addendum)
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 As discussed, this disorder is manifested with increased fluid spaces and subsequent increase fluid pressure on the brain.  There is no single diagnostic test for this condition, symptoms can include speech disorder, memory loss, bladder dysfunction, and gait disorder.  Your History and physical exam as well as your brain MRI could be indicative of his condition.  We often request a large volume lumbar puncture/spinal tap to help with the diagnosis and we assess walking and improvement in gait and balance after the spinal tap and compare with findings from before. Unfortunately, improvement after the large volume spinal tap are only temporary, may last for days, typically not longer.  We request physical therapy evaluation for Evaluations before and after the lumbar puncture.   The request for spinal tap through radiology. Ultimately, the treatment option that would help reduce the amount of fluid around the brain on a more longterm basis, is the placement of a permanent shunt. We request a neurosurgery consultation for this. Please think about the spinal tap and let me know.

## 2017-05-31 ENCOUNTER — Telehealth: Payer: Self-pay | Admitting: Neurology

## 2017-05-31 DIAGNOSIS — G912 (Idiopathic) normal pressure hydrocephalus: Secondary | ICD-10-CM

## 2017-05-31 NOTE — Telephone Encounter (Signed)
VO for referral to [facility] received from Dr. Rexene Alberts. Order placed.

## 2017-05-31 NOTE — Telephone Encounter (Signed)
Pts son Elta Guadeloupe calling requesting the referral for [place] [facility] spine go ahead and fax in. Elta Guadeloupe also would like to see Dr. Arnoldo Morale there if possible. Please call to advise

## 2017-06-15 DIAGNOSIS — R030 Elevated blood-pressure reading, without diagnosis of hypertension: Secondary | ICD-10-CM | POA: Diagnosis not present

## 2017-06-15 DIAGNOSIS — G910 Communicating hydrocephalus: Secondary | ICD-10-CM | POA: Diagnosis not present

## 2017-06-22 ENCOUNTER — Other Ambulatory Visit: Payer: Self-pay | Admitting: Neurosurgery

## 2017-06-26 ENCOUNTER — Other Ambulatory Visit: Payer: Self-pay | Admitting: Neurosurgery

## 2017-06-26 NOTE — Pre-Procedure Instructions (Signed)
[facility]ubriana [facility]  06/26/2017      [id number] [id number]: [phone]: [phone]   Your procedure is scheduled on 07/03/2017.  [facility]  Call this number if you have problems the morning of surgery:  [phone]  Remember:  Nothing to eat or drink after midnight the night before your surgery    Take these medicines the morning of surgery with A SIP OF WATER:  NONE  7 days prior to surgery STOP taking any Aspirin(unless otherwise instructed by your surgeon), Aleve, Naproxen, Ibuprofen, Motrin, Advil, Goody's, BC's, all herbal medications, fish oil, and all vitamins  Follow your doctors instructions regarding your Aspirin.  If no instructions were given by your doctor, then you will need to call your surgeon's office to get instructions.        Do not wear jewelry, make-up or nail polish.  Do not wear lotions, powders, or perfumes, or deodorant.  Do not shave 48 hours prior to surgery.    Do not bring valuables to the hospital.  [facility] not responsible for any belongings or valuables.  Hearing aids, eyeglasses, contacts, dentures or bridgework may not be worn into surgery.  Leave your suitcase in the car.  After surgery it may be brought to your room.  For patients admitted to the hospital, discharge time will be determined by your treatment team.  Patients discharged the day of surgery will not be allowed to drive home.   Name and phone number of your driver:    Special instructions:   [facility] [place] [facility]  Before surgery, you can play an important role. Because skin is not sterile, your skin needs to be as free of germs as possible. You can reduce the number of germs on your skin by washing with CHG (chlorahexidine gluconate) Soap before surgery.  CHG is an antiseptic cleaner which kills germs and bonds with the skin  to continue killing germs even after washing.    Oral Hygiene is also important to reduce your risk of infection.  Remember - BRUSH YOUR TEETH THE MORNING OF SURGERY WITH YOUR REGULAR TOOTHPASTE  Please do not use if you have an allergy to CHG or antibacterial soaps. If your skin becomes reddened/irritated stop using the CHG.  Do not shave (including legs and underarms) for at least 48 hours prior to first CHG shower. It is OK to shave your face.  Please follow these instructions carefully.   1. Shower the NIGHT BEFORE SURGERY and the MORNING OF SURGERY with CHG.   2. If you chose to wash your hair, wash your hair first as usual with your normal shampoo.  3. After you shampoo, rinse your hair and body thoroughly to remove the shampoo.  4. Use CHG as you would any other liquid soap. You can apply CHG directly to the skin and wash gently with a scrungie or a clean washcloth.   5. Apply the CHG Soap to your body ONLY FROM THE NECK DOWN.  Do not use on open wounds or open sores. Avoid contact with your eyes, ears, mouth and genitals (private parts). Wash Face and genitals (private parts)  with your normal soap.  6. Wash thoroughly, paying special attention to the area where your surgery will be performed.  7. Thoroughly rinse your body with warm water from the neck down.  8.  DO NOT shower/wash with your normal soap after using and rinsing off the CHG Soap.  9. Pat yourself dry with a CLEAN TOWEL.  10. Wear CLEAN PAJAMAS to bed the night before surgery, wear comfortable clothes the morning of surgery  11. Place CLEAN SHEETS on your bed the night of your first shower and DO NOT SLEEP WITH PETS.    Day of Surgery:  Do not apply any deodorants/lotions.  Please wear clean clothes to the hospital/surgery center.   Remember to brush your teeth WITH YOUR REGULAR TOOTHPASTE.    Please read over the following fact sheets that you were given.

## 2017-06-27 ENCOUNTER — Other Ambulatory Visit: Payer: Self-pay

## 2017-06-27 ENCOUNTER — Encounter (HOSPITAL_COMMUNITY): Payer: Self-pay

## 2017-06-27 ENCOUNTER — Encounter (HOSPITAL_COMMUNITY)
Admission: RE | Admit: 2017-06-27 | Discharge: 2017-06-27 | Disposition: A | Discharge status: Home / Self Care | Payer: PPO | Source: Ambulatory Visit | Attending: Neurosurgery | Admitting: Neurosurgery

## 2017-06-27 DIAGNOSIS — Z79899 Other long term (current) drug therapy: Secondary | ICD-10-CM | POA: Diagnosis not present

## 2017-06-27 DIAGNOSIS — Z7982 Long term (current) use of aspirin: Secondary | ICD-10-CM | POA: Diagnosis not present

## 2017-06-27 DIAGNOSIS — I083 Combined rheumatic disorders of mitral, aortic and tricuspid valves: Secondary | ICD-10-CM | POA: Diagnosis not present

## 2017-06-27 DIAGNOSIS — K900 Celiac disease: Secondary | ICD-10-CM | POA: Insufficient documentation

## 2017-06-27 DIAGNOSIS — Z01810 Encounter for preprocedural cardiovascular examination: Secondary | ICD-10-CM | POA: Diagnosis not present

## 2017-06-27 DIAGNOSIS — E785 Hyperlipidemia, unspecified: Secondary | ICD-10-CM | POA: Diagnosis not present

## 2017-06-27 DIAGNOSIS — G910 Communicating hydrocephalus: Secondary | ICD-10-CM | POA: Diagnosis not present

## 2017-06-27 DIAGNOSIS — Z01812 Encounter for preprocedural laboratory examination: Secondary | ICD-10-CM | POA: Diagnosis not present

## 2017-06-27 DIAGNOSIS — Z85118 Personal history of other malignant neoplasm of bronchus and lung: Secondary | ICD-10-CM | POA: Insufficient documentation

## 2017-06-27 DIAGNOSIS — Z902 Acquired absence of lung [part of]: Secondary | ICD-10-CM | POA: Insufficient documentation

## 2017-06-27 HISTORY — DX: Dyspnea, unspecified: R06.00

## 2017-06-27 LAB — CBC
HEMATOCRIT: 35 % — AB (ref 36.0–46.0)
Hemoglobin: 11.4 g/dL — ABNORMAL LOW (ref 12.0–15.0)
MCH: 30.6 pg (ref 26.0–34.0)
MCHC: 32.6 g/dL (ref 30.0–36.0)
MCV: 94.1 fL (ref 78.0–100.0)
Platelets: 257 10*3/uL (ref 150–400)
RBC: 3.72 MIL/uL — ABNORMAL LOW (ref 3.87–5.11)
RDW: 13.5 % (ref 11.5–15.5)
WBC: 6.1 10*3/uL (ref 4.0–10.5)

## 2017-06-27 LAB — BASIC METABOLIC PANEL
ANION GAP: 10 (ref 5–15)
BUN: 13 mg/dL (ref 6–20)
CO2: 24 mmol/L (ref 22–32)
Calcium: 9.4 mg/dL (ref 8.9–10.3)
Chloride: 106 mmol/L (ref 101–111)
Creatinine, Ser: 0.77 mg/dL (ref 0.44–1.00)
GFR calc Af Amer: >60 mL/min (ref >60)
Glucose, Bld: 90 mg/dL (ref 65–99)
POTASSIUM: 3.8 mmol/L (ref 3.5–5.1)
SODIUM: 140 mmol/L (ref 135–145)

## 2017-06-27 NOTE — Progress Notes (Signed)
PCP - Dr Caryl Comes Cardiologist - denies cardiologist or cardiac hx/symptoms  EKG - 06/27/2017  ECHO - 06/2011, records requested from [facility]  Aspirin Instructions: pt has already stopped ASA per MD  Anesthesia review: EKG and follow up echo results from [facility]  Patient denies shortness of breath, fever, cough and chest pain at PAT appointment   Patient verbalized understanding of instructions that were given to them at the PAT appointment. Patient was also instructed that they will need to review over the PAT instructions again at home before surgery.

## 2017-06-28 NOTE — Progress Notes (Signed)
Anesthesia Chart Review:   Case:  [id number] Date/Time:  07/03/17 0900   Procedure:  SHUNT INSERTION VENTRICULAR-PERITONEAL (Right ) - SHUNT INSERTION VENTRICULAR-PERITONEAL   Anesthesia type:  General   Pre-op diagnosis:  COMMUNICATING HYDROCEPHALUS   Location:  [place] OR ROOM 19 / [place] OR   Surgeon:  Newman Pies, MD      DISCUSSION: - Pt is a 72 year old female with hx lung cancer (s/p RUL lobectomy)   VS: BP (!) 153/62   Pulse 74   Temp 36.8 C   Resp 18   Ht 5' 5"  (1.651 m)   Wt 122 lb 14.4 oz (55.7 kg)   SpO2 97%   BMI 20.45 kg/m    PROVIDERS: PCP is Adin Hector, MD Neurologist is Star Age, MD who referred pt for surgery   LABS: Labs reviewed: Acceptable for surgery. (all labs ordered are listed, but only abnormal results are displayed)  Labs Reviewed  CBC - Abnormal; Notable for the following components:      Result Value   RBC 3.72 (*)    Hemoglobin 11.4 (*)    HCT 35.0 (*)    All other components within normal limits  BASIC METABOLIC PANEL    EKG 0/24/09: Sinus rhythm with short PR Possible Inferior infarct, age undetermined.  - Prior T wave inversion lead II has resolved; T wave inversion III and aVF also present on prior EKG 06/29/11  CV:  Echo 07/03/2011 [facility]): 1.  LV systolic function normal.  EF 50%. 2.  Mild to moderate mitral regurgitation. 3.  Mild tricuspid regurgitation. 4.  Mild aortic regurgitation.   Past Medical History:  Diagnosis Date  . Abnormal Q waves on electrocardiogram   . Anemia   . Aortic atherosclerosis ([place])    "I could possibly have it, my grandmother had it"  . Cancer [facility]) 05/2011   Right upper Lung CA with partial Lobectomy.  . Celiac disease   . Celiac disease   . Dyspnea    with exertion  . Hyperlipidemia   . Hyperlipidemia   . Lung mass   . Meningioma ([place])   . Osteoporosis     Past Surgical History:  Procedure Laterality Date  . LUNG LOBECTOMY     right lung    MEDICATIONS: . aspirin EC 81  MG tablet  . calcium carbonate (TUMS - DOSED IN MG ELEMENTAL CALCIUM) 500 MG chewable tablet  . Cholecalciferol (VITAMIN D) 2000 units tablet  . Multiple Vitamin (MULTI-VITAMINS) TABS   No current facility-administered medications for this encounter.     If no changes, I anticipate pt can proceed with surgery as scheduled.   Willeen Cass, FNP-BC [facility]/Anesthesiology Phone: [phone] 06/28/2017 12:11 PM

## 2017-07-03 ENCOUNTER — Inpatient Hospital Stay (HOSPITAL_COMMUNITY): Payer: PPO | Admitting: Vascular Surgery

## 2017-07-03 ENCOUNTER — Inpatient Hospital Stay (HOSPITAL_COMMUNITY)
Admission: RE | Admit: 2017-07-03 | Discharge: 2017-07-07 | DRG: 033 | Disposition: A | Discharge status: Rehabilitation Facility | Payer: PPO | Attending: Neurosurgery | Admitting: Neurosurgery

## 2017-07-03 ENCOUNTER — Inpatient Hospital Stay (HOSPITAL_COMMUNITY): Payer: PPO | Admitting: Certified Registered Nurse Anesthetist

## 2017-07-03 ENCOUNTER — Encounter (HOSPITAL_COMMUNITY): Payer: Self-pay | Admitting: Surgery

## 2017-07-03 ENCOUNTER — Other Ambulatory Visit: Payer: Self-pay

## 2017-07-03 ENCOUNTER — Encounter (HOSPITAL_COMMUNITY)
Admission: RE | Disposition: A | Discharge status: Rehabilitation Facility | Payer: Self-pay | Source: Home / Self Care | Attending: Neurosurgery

## 2017-07-03 DIAGNOSIS — R269 Unspecified abnormalities of gait and mobility: Secondary | ICD-10-CM | POA: Diagnosis not present

## 2017-07-03 DIAGNOSIS — G910 Communicating hydrocephalus: Principal | ICD-10-CM | POA: Diagnosis present

## 2017-07-03 DIAGNOSIS — R2689 Other abnormalities of gait and mobility: Secondary | ICD-10-CM | POA: Diagnosis present

## 2017-07-03 DIAGNOSIS — Z825 Family history of asthma and other chronic lower respiratory diseases: Secondary | ICD-10-CM | POA: Diagnosis not present

## 2017-07-03 DIAGNOSIS — H9311 Tinnitus, right ear: Secondary | ICD-10-CM | POA: Diagnosis present

## 2017-07-03 DIAGNOSIS — G441 Vascular headache, not elsewhere classified: Secondary | ICD-10-CM | POA: Diagnosis not present

## 2017-07-03 DIAGNOSIS — Z833 Family history of diabetes mellitus: Secondary | ICD-10-CM | POA: Diagnosis not present

## 2017-07-03 DIAGNOSIS — Z803 Family history of malignant neoplasm of breast: Secondary | ICD-10-CM

## 2017-07-03 DIAGNOSIS — R3981 Functional urinary incontinence: Secondary | ICD-10-CM | POA: Diagnosis not present

## 2017-07-03 DIAGNOSIS — Z7982 Long term (current) use of aspirin: Secondary | ICD-10-CM | POA: Diagnosis not present

## 2017-07-03 DIAGNOSIS — Z86011 Personal history of benign neoplasm of the brain: Secondary | ICD-10-CM | POA: Diagnosis not present

## 2017-07-03 DIAGNOSIS — M810 Age-related osteoporosis without current pathological fracture: Secondary | ICD-10-CM | POA: Diagnosis present

## 2017-07-03 DIAGNOSIS — G919 Hydrocephalus, unspecified: Secondary | ICD-10-CM | POA: Diagnosis present

## 2017-07-03 DIAGNOSIS — Z79899 Other long term (current) drug therapy: Secondary | ICD-10-CM | POA: Diagnosis not present

## 2017-07-03 DIAGNOSIS — R4189 Other symptoms and signs involving cognitive functions and awareness: Secondary | ICD-10-CM | POA: Diagnosis not present

## 2017-07-03 DIAGNOSIS — Z8249 Family history of ischemic heart disease and other diseases of the circulatory system: Secondary | ICD-10-CM

## 2017-07-03 DIAGNOSIS — Z902 Acquired absence of lung [part of]: Secondary | ICD-10-CM

## 2017-07-03 DIAGNOSIS — M21372 Foot drop, left foot: Secondary | ICD-10-CM | POA: Diagnosis present

## 2017-07-03 DIAGNOSIS — R0989 Other specified symptoms and signs involving the circulatory and respiratory systems: Secondary | ICD-10-CM | POA: Diagnosis not present

## 2017-07-03 DIAGNOSIS — Z85118 Personal history of other malignant neoplasm of bronchus and lung: Secondary | ICD-10-CM

## 2017-07-03 DIAGNOSIS — Z87891 Personal history of nicotine dependence: Secondary | ICD-10-CM

## 2017-07-03 DIAGNOSIS — D62 Acute posthemorrhagic anemia: Secondary | ICD-10-CM | POA: Diagnosis not present

## 2017-07-03 DIAGNOSIS — K5901 Slow transit constipation: Secondary | ICD-10-CM | POA: Diagnosis not present

## 2017-07-03 DIAGNOSIS — E785 Hyperlipidemia, unspecified: Secondary | ICD-10-CM | POA: Diagnosis not present

## 2017-07-03 DIAGNOSIS — R531 Weakness: Secondary | ICD-10-CM | POA: Diagnosis not present

## 2017-07-03 DIAGNOSIS — K900 Celiac disease: Secondary | ICD-10-CM | POA: Diagnosis present

## 2017-07-03 DIAGNOSIS — Z982 Presence of cerebrospinal fluid drainage device: Secondary | ICD-10-CM | POA: Diagnosis not present

## 2017-07-03 DIAGNOSIS — D649 Anemia, unspecified: Secondary | ICD-10-CM | POA: Diagnosis not present

## 2017-07-03 HISTORY — PX: VENTRICULOPERITONEAL SHUNT: SHX204

## 2017-07-03 LAB — MRSA PCR SCREENING: MRSA by PCR: NEGATIVE

## 2017-07-03 SURGERY — SHUNT INSERTION VENTRICULAR-PERITONEAL
Anesthesia: General | Site: Head | Laterality: Right

## 2017-07-03 MED ORDER — SUGAMMADEX SODIUM 200 MG/2ML IV SOLN
INTRAVENOUS | Status: DC | PRN
  Administered 2017-07-03: 120 mg via INTRAVENOUS

## 2017-07-03 MED ORDER — ACETAMINOPHEN 650 MG RE SUPP: 650.0000 mg | RECTAL | Status: DC | PRN

## 2017-07-03 MED ORDER — CEFAZOLIN SODIUM-DEXTROSE 2-4 GM/100ML-% IV SOLN
2.0000 g | INTRAVENOUS | Status: AC
  Administered 2017-07-03: 2 g via INTRAVENOUS
  Filled 2017-07-03: qty 100

## 2017-07-03 MED ORDER — BACITRACIN 50000 UNITS IM SOLR
INTRAMUSCULAR | Status: DC | PRN
  Administered 2017-07-03: 500 mL

## 2017-07-03 MED ORDER — LABETALOL HCL 5 MG/ML IV SOLN
10.0000 mg | INTRAVENOUS | Status: DC | PRN
  Administered 2017-07-03 (×2): 10 mg via INTRAVENOUS

## 2017-07-03 MED ORDER — DEXAMETHASONE SODIUM PHOSPHATE 10 MG/ML IJ SOLN
INTRAMUSCULAR | Status: AC
  Filled 2017-07-03: qty 1

## 2017-07-03 MED ORDER — ONDANSETRON HCL 4 MG/2ML IJ SOLN: 4.0000 mg | INTRAMUSCULAR | Status: DC | PRN

## 2017-07-03 MED ORDER — EPHEDRINE SULFATE 50 MG/ML IJ SOLN
INTRAMUSCULAR | Status: AC
  Filled 2017-07-03: qty 1

## 2017-07-03 MED ORDER — BUPIVACAINE-EPINEPHRINE (PF) 0.5% -1:200000 IJ SOLN
INTRAMUSCULAR | Status: AC
  Filled 2017-07-03: qty 30

## 2017-07-03 MED ORDER — CHLORHEXIDINE GLUCONATE CLOTH 2 % EX PADS: 6.0000 | MEDICATED_PAD | Freq: Once | CUTANEOUS | Status: DC

## 2017-07-03 MED ORDER — PHENYLEPHRINE 40 MCG/ML (10ML) SYRINGE FOR IV PUSH (FOR BLOOD PRESSURE SUPPORT)
PREFILLED_SYRINGE | INTRAVENOUS | Status: DC | PRN
  Administered 2017-07-03: 40 ug via INTRAVENOUS

## 2017-07-03 MED ORDER — MORPHINE SULFATE (PF) 2 MG/ML IV SOLN: 1.0000 mg | INTRAVENOUS | Status: DC | PRN

## 2017-07-03 MED ORDER — FENTANYL CITRATE (PF) 250 MCG/5ML IJ SOLN
INTRAMUSCULAR | Status: AC
Start: 2017-07-03 — End: ?
  Filled 2017-07-03: qty 5

## 2017-07-03 MED ORDER — FENTANYL CITRATE (PF) 250 MCG/5ML IJ SOLN
INTRAMUSCULAR | Status: DC | PRN
  Administered 2017-07-03 (×2): 50 ug via INTRAVENOUS

## 2017-07-03 MED ORDER — PROPOFOL 10 MG/ML IV BOLUS
INTRAVENOUS | Status: AC
  Filled 2017-07-03: qty 20

## 2017-07-03 MED ORDER — THROMBIN 5000 UNITS EX SOLR
CUTANEOUS | Status: AC
  Filled 2017-07-03: qty 10000

## 2017-07-03 MED ORDER — BACITRACIN ZINC 500 UNIT/GM EX OINT
TOPICAL_OINTMENT | CUTANEOUS | Status: AC
  Filled 2017-07-03: qty 28.35

## 2017-07-03 MED ORDER — SUCCINYLCHOLINE CHLORIDE 200 MG/10ML IV SOSY
PREFILLED_SYRINGE | INTRAVENOUS | Status: AC
  Filled 2017-07-03: qty 10

## 2017-07-03 MED ORDER — DEXAMETHASONE SODIUM PHOSPHATE 10 MG/ML IJ SOLN
INTRAMUSCULAR | Status: DC | PRN
  Administered 2017-07-03: 5 mg via INTRAVENOUS

## 2017-07-03 MED ORDER — 0.9 % SODIUM CHLORIDE (POUR BTL) OPTIME
TOPICAL | Status: DC | PRN
  Administered 2017-07-03: 1000 mL

## 2017-07-03 MED ORDER — ONDANSETRON HCL 4 MG/2ML IJ SOLN
INTRAMUSCULAR | Status: AC
  Filled 2017-07-03: qty 2

## 2017-07-03 MED ORDER — LIDOCAINE 2% (20 MG/ML) 5 ML SYRINGE
INTRAMUSCULAR | Status: AC
  Filled 2017-07-03: qty 5

## 2017-07-03 MED ORDER — ACETAMINOPHEN 325 MG PO TABS: 650.0000 mg | ORAL_TABLET | ORAL | Status: DC | PRN

## 2017-07-03 MED ORDER — LACTATED RINGERS IV SOLN
INTRAVENOUS | Status: DC
  Administered 2017-07-03: 07:00:00 via INTRAVENOUS

## 2017-07-03 MED ORDER — ONDANSETRON HCL 4 MG/2ML IJ SOLN
INTRAMUSCULAR | Status: DC | PRN
  Administered 2017-07-03: 4 mg via INTRAVENOUS

## 2017-07-03 MED ORDER — PROMETHAZINE HCL 25 MG PO TABS: 12.5000 mg | ORAL_TABLET | ORAL | Status: DC | PRN

## 2017-07-03 MED ORDER — HYDROCODONE-ACETAMINOPHEN 5-325 MG PO TABS
1.0000 | ORAL_TABLET | ORAL | Status: DC | PRN
  Administered 2017-07-03 – 2017-07-07 (×13): 1 via ORAL
  Filled 2017-07-03 (×13): qty 1

## 2017-07-03 MED ORDER — ONDANSETRON HCL 4 MG PO TABS: 4.0000 mg | ORAL_TABLET | ORAL | Status: DC | PRN

## 2017-07-03 MED ORDER — THROMBIN 5000 UNITS EX SOLR
OROMUCOSAL | Status: DC | PRN
  Administered 2017-07-03: 5 mL

## 2017-07-03 MED ORDER — LABETALOL HCL 5 MG/ML IV SOLN
INTRAVENOUS | Status: AC
  Filled 2017-07-03: qty 4

## 2017-07-03 MED ORDER — BACITRACIN ZINC 500 UNIT/GM EX OINT
TOPICAL_OINTMENT | CUTANEOUS | Status: DC | PRN
  Administered 2017-07-03: 1 via TOPICAL

## 2017-07-03 MED ORDER — PHENYLEPHRINE 40 MCG/ML (10ML) SYRINGE FOR IV PUSH (FOR BLOOD PRESSURE SUPPORT)
PREFILLED_SYRINGE | INTRAVENOUS | Status: AC
  Filled 2017-07-03: qty 10

## 2017-07-03 MED ORDER — POTASSIUM CHLORIDE IN NACL 20-0.9 MEQ/L-% IV SOLN
INTRAVENOUS | Status: DC
  Administered 2017-07-03 – 2017-07-04 (×3): via INTRAVENOUS
  Filled 2017-07-03 (×5): qty 1000

## 2017-07-03 MED ORDER — SUGAMMADEX SODIUM 200 MG/2ML IV SOLN
INTRAVENOUS | Status: AC
  Filled 2017-07-03: qty 2

## 2017-07-03 MED ORDER — ROCURONIUM BROMIDE 10 MG/ML (PF) SYRINGE
PREFILLED_SYRINGE | INTRAVENOUS | Status: DC | PRN
  Administered 2017-07-03: 50 mg via INTRAVENOUS

## 2017-07-03 MED ORDER — LIDOCAINE 2% (20 MG/ML) 5 ML SYRINGE
INTRAMUSCULAR | Status: DC | PRN
  Administered 2017-07-03: 20 mg via INTRAVENOUS

## 2017-07-03 MED ORDER — FENTANYL CITRATE (PF) 100 MCG/2ML IJ SOLN
25.0000 ug | INTRAMUSCULAR | Status: DC | PRN
  Administered 2017-07-03: 50 ug via INTRAVENOUS

## 2017-07-03 MED ORDER — EPHEDRINE SULFATE-NACL 50-0.9 MG/10ML-% IV SOSY
PREFILLED_SYRINGE | INTRAVENOUS | Status: DC | PRN
  Administered 2017-07-03: 5 mg via INTRAVENOUS

## 2017-07-03 MED ORDER — BUPIVACAINE-EPINEPHRINE 0.5% -1:200000 IJ SOLN
INTRAMUSCULAR | Status: DC | PRN
  Administered 2017-07-03: 15 mL

## 2017-07-03 MED ORDER — ROCURONIUM BROMIDE 50 MG/5ML IV SOLN
INTRAVENOUS | Status: AC
  Filled 2017-07-03: qty 1

## 2017-07-03 MED ORDER — CEFAZOLIN SODIUM-DEXTROSE 1-4 GM/50ML-% IV SOLN
1.0000 g | Freq: Three times a day (TID) | INTRAVENOUS | Status: AC
  Administered 2017-07-03 – 2017-07-04 (×2): 1 g via INTRAVENOUS
  Filled 2017-07-03 (×2): qty 50

## 2017-07-03 MED ORDER — FENTANYL CITRATE (PF) 100 MCG/2ML IJ SOLN
INTRAMUSCULAR | Status: AC
  Filled 2017-07-03: qty 2

## 2017-07-03 MED ORDER — DOCUSATE SODIUM 100 MG PO CAPS
100.0000 mg | ORAL_CAPSULE | Freq: Two times a day (BID) | ORAL | Status: DC
  Administered 2017-07-03 – 2017-07-07 (×8): 100 mg via ORAL
  Filled 2017-07-03 (×8): qty 1

## 2017-07-03 MED ORDER — PROPOFOL 10 MG/ML IV BOLUS
INTRAVENOUS | Status: DC | PRN
  Administered 2017-07-03: 80 mg via INTRAVENOUS

## 2017-07-03 SURGICAL SUPPLY — 64 items
BAG DECANTER FOR FLEXI CONT (MISCELLANEOUS) ×3 IMPLANT
BENZOIN TINCTURE PRP APPL 2/3 (GAUZE/BANDAGES/DRESSINGS) ×3 IMPLANT
BLADE CLIPPER SURG (BLADE) IMPLANT
BOOT SUTURE AID YELLOW STND (SUTURE) ×3 IMPLANT
BUR ACORN 6.0 PRECISION (BURR) ×2 IMPLANT
BUR ACORN 6.0MM PRECISION (BURR) ×1
BUR PRECISION FLUTE 6.0 (BURR) ×3 IMPLANT
CANISTER SUCT 3000ML PPV (MISCELLANEOUS) ×3 IMPLANT
CARTRIDGE OIL MAESTRO DRILL (MISCELLANEOUS) ×1 IMPLANT
CLIP RANEY DISP (INSTRUMENTS) ×3 IMPLANT
CLOSURE STERI-STRIP 1/2X4 (GAUZE/BANDAGES/DRESSINGS) ×1
CLOSURE WOUND 1/2 X4 (GAUZE/BANDAGES/DRESSINGS) ×1
CLSR STERI-STRIP ANTIMIC 1/2X4 (GAUZE/BANDAGES/DRESSINGS) ×2 IMPLANT
DIFFUSER DRILL AIR PNEUMATIC (MISCELLANEOUS) ×3 IMPLANT
DRAPE INCISE IOBAN 85X60 (DRAPES) ×3 IMPLANT
DRAPE ORTHO SPLIT 77X108 STRL (DRAPES) ×4
DRAPE POUCH INSTRU U-SHP 10X18 (DRAPES) ×3 IMPLANT
DRAPE SURG 17X23 STRL (DRAPES) ×18 IMPLANT
DRAPE SURG ORHT 6 SPLT 77X108 (DRAPES) ×2 IMPLANT
ELECT REM PT RETURN 9FT ADLT (ELECTROSURGICAL) ×3
ELECTRODE REM PT RTRN 9FT ADLT (ELECTROSURGICAL) ×1 IMPLANT
GAUZE SPONGE 4X4 12PLY STRL LF (GAUZE/BANDAGES/DRESSINGS) ×3 IMPLANT
GAUZE SPONGE 4X4 16PLY XRAY LF (GAUZE/BANDAGES/DRESSINGS) IMPLANT
GLOVE BIO SURGEON STRL SZ8 (GLOVE) ×3 IMPLANT
GLOVE BIO SURGEON STRL SZ8.5 (GLOVE) ×3 IMPLANT
GLOVE BIOGEL PI IND STRL 6.5 (GLOVE) ×2 IMPLANT
GLOVE BIOGEL PI INDICATOR 6.5 (GLOVE) ×4
GLOVE EXAM NITRILE LRG STRL (GLOVE) IMPLANT
GLOVE EXAM NITRILE XL STR (GLOVE) IMPLANT
GLOVE EXAM NITRILE XS STR PU (GLOVE) IMPLANT
GLOVE SURG SS PI 6.5 STRL IVOR (GLOVE) ×9 IMPLANT
GOWN STRL REUS W/ TWL LRG LVL3 (GOWN DISPOSABLE) ×1 IMPLANT
GOWN STRL REUS W/ TWL XL LVL3 (GOWN DISPOSABLE) ×1 IMPLANT
GOWN STRL REUS W/TWL LRG LVL3 (GOWN DISPOSABLE) ×2
GOWN STRL REUS W/TWL XL LVL3 (GOWN DISPOSABLE) ×2
KIT BASIN OR (CUSTOM PROCEDURE TRAY) ×3 IMPLANT
KIT TURNOVER KIT B (KITS) ×3 IMPLANT
NEEDLE HYPO 22GX1.5 SAFETY (NEEDLE) ×3 IMPLANT
NS IRRIG 1000ML POUR BTL (IV SOLUTION) ×3 IMPLANT
OIL CARTRIDGE MAESTRO DRILL (MISCELLANEOUS) ×3
PACK LAMINECTOMY NEURO (CUSTOM PROCEDURE TRAY) ×3 IMPLANT
PAD ARMBOARD 7.5X6 YLW CONV (MISCELLANEOUS) ×9 IMPLANT
SHEATH PERITONEAL INTRO 46 (MISCELLANEOUS) IMPLANT
SHEATH PERITONEAL INTRO 61 (MISCELLANEOUS) ×3 IMPLANT
SPONGE LAP 4X18 RFD (DISPOSABLE) IMPLANT
SPONGE SURGIFOAM ABS GEL SZ50 (HEMOSTASIS) IMPLANT
STAPLER SKIN PROX WIDE 3.9 (STAPLE) ×3 IMPLANT
STRIP CLOSURE SKIN 1/2X4 (GAUZE/BANDAGES/DRESSINGS) ×2 IMPLANT
SUT ETHILON 3 0 PS 1 (SUTURE) IMPLANT
SUT NURALON 4 0 TR CR/8 (SUTURE) IMPLANT
SUT SILK 0 TIES 10X30 (SUTURE) ×3 IMPLANT
SUT SILK 3 0 SH 30 (SUTURE) ×3 IMPLANT
SUT VIC AB 1 CT1 18XBRD ANBCTR (SUTURE) IMPLANT
SUT VIC AB 1 CT1 8-18 (SUTURE)
SUT VIC AB 2-0 CP2 18 (SUTURE) ×3 IMPLANT
SUT VIC AB 3-0 SH 8-18 (SUTURE) ×3 IMPLANT
SYR CONTROL 10ML LL (SYRINGE) ×3 IMPLANT
TAPE CLOTH SURG 4X10 WHT LF (GAUZE/BANDAGES/DRESSINGS) ×3 IMPLANT
TOWEL GREEN STERILE (TOWEL DISPOSABLE) ×3 IMPLANT
TOWEL GREEN STERILE FF (TOWEL DISPOSABLE) ×3 IMPLANT
TRAY FOLEY MTR SLVR 16FR STAT (SET/KITS/TRAYS/PACK) IMPLANT
UNDERPAD 30X30 (UNDERPADS AND DIAPERS) ×3 IMPLANT
VALVE RT ANGLE UNITIZE DIST (Valve) ×3 IMPLANT
WATER STERILE IRR 1000ML POUR (IV SOLUTION) ×3 IMPLANT

## 2017-07-03 NOTE — Anesthesia Preprocedure Evaluation (Addendum)
Anesthesia Evaluation  Patient identified by MRN, date of birth, ID band Patient awake    Reviewed: Allergy & Precautions, NPO status , Patient's Chart, lab work & pertinent test results  History of Anesthesia Complications Negative for: history of anesthetic complications  Airway Mallampati: II  TM Distance: >3 FB Neck ROM: Full    Dental  (+) Dental Advisory Given   Pulmonary former smoker,  Lung cancer: S/p lobectomy RUL   breath sounds clear to auscultation       Cardiovascular negative cardio ROS   Rhythm:Regular Rate:Normal     Neuro/Psych Hydrocephalus H/o meningioma    GI/Hepatic Neg liver ROS, neg GERD  ,celiac   Endo/Other  negative endocrine ROS  Renal/GU negative Renal ROS     Musculoskeletal  (+) Arthritis , Osteoarthritis,    Abdominal   Peds  Hematology  (+) Blood dyscrasia (Hb 11.4), anemia ,   Anesthesia Other Findings   Reproductive/Obstetrics                            Anesthesia Physical Anesthesia Plan  ASA: III  Anesthesia Plan: General   Post-op Pain Management:    Induction:   PONV Risk Score and Plan: 3 and Ondansetron, Dexamethasone and Treatment may vary due to age or medical condition  Airway Management Planned: Oral ETT  Additional Equipment:   Intra-op Plan:   Post-operative Plan: Extubation in OR  Informed Consent: I have reviewed the patients History and Physical, chart, labs and discussed the procedure including the risks, benefits and alternatives for the proposed anesthesia with the patient or authorized representative who has indicated his/her understanding and acceptance.   Dental advisory given  Plan Discussed with: CRNA and Surgeon  Anesthesia Plan Comments: (Plan routine monitors, GETA)        Anesthesia Quick Evaluation

## 2017-07-03 NOTE — Anesthesia Procedure Notes (Signed)
Procedure Name: Intubation Date/Time: 07/03/2017 9:25 AM Performed by: Julieta Bellini, CRNA Pre-anesthesia Checklist: Patient identified, Emergency Drugs available, Suction available and Patient being monitored Patient Re-evaluated:Patient Re-evaluated prior to induction Oxygen Delivery Method: Circle system utilized Preoxygenation: Pre-oxygenation with 100% oxygen Induction Type: IV induction Ventilation: Mask ventilation without difficulty Laryngoscope Size: Mac and 3 Grade View: Grade I Tube type: Oral Tube size: 7.0 mm Number of attempts: 1 Airway Equipment and Method: Stylet Placement Confirmation: ETT inserted through vocal cords under direct vision,  positive ETCO2 and breath sounds checked- equal and bilateral Secured at: 21 cm Tube secured with: Tape Dental Injury: Teeth and Oropharynx as per pre-operative assessment

## 2017-07-03 NOTE — Anesthesia Postprocedure Evaluation (Signed)
Anesthesia Post Note  Patient: Krista Evans  Procedure(s) Performed: SHUNT INSERTION VENTRICULAR-PERITONEAL (Right Head)     Patient location during evaluation: PACU Anesthesia Type: General Level of consciousness: awake and alert, oriented and patient cooperative Pain management: pain level controlled Vital Signs Assessment: post-procedure vital signs reviewed and stable Respiratory status: spontaneous breathing, nonlabored ventilation and respiratory function stable Cardiovascular status: blood pressure returned to baseline and stable Postop Assessment: no apparent nausea or vomiting Anesthetic complications: no    Last Vitals:  Vitals:   07/03/17 1215 07/03/17 1230  BP: (!) 137/58 140/68  Pulse: 74 72  Resp: 13 16  Temp:    SpO2: 96% 95%    Last Pain:  Vitals:   07/03/17 1155  TempSrc: Oral  PainSc: 0-No pain                 Ladonte Verstraete,E. Tomer Chalmers

## 2017-07-03 NOTE — Op Note (Signed)
Brief history: The patient is a 72 year old white female who has complained of memory difficulties, unsteady gait, and urinary incontinence.  She was worked up with a brain MRI which demonstrated brain atrophy and ventriculomegaly.  I discussed the various treatment option with the patient.  She has weighed the risk, benefits, and alternatives surgery and decided to proceed with placement of a ventriculoperitoneal shunt.  Preop diagnosis: Communicating hydrocephalus  Postop diagnosis: The same  Procedure: Placement of right retroauricular ventriculoperitoneal shunt (Codman programmable Hakim shunt set at 80 mmHg)  Surgeon: Dr. Earle Gell  Assistant: Dr. Sherley Bounds  Anesthesia: General tracheal  Estimated blood loss: Minimal  Specimens: None  Complications: None  Description of procedure: The patient was brought to the operating room by the anesthesia team.  General endotracheal anesthesia was induced.  The patient remained in the supine position.  A roll was placed on her right flank.  Her head was turned to the left.  The patient's right retroauricular region was then shaved with clippers.  This region, her neck thorax and abdomen were then prepared with Betadine scrub and Betadine solution.  Sterile drapes were applied.  I injected the areas to be incised with Marcaine with epinephrine solution.  I made an abdominal incision in the patient's right upper quadrant.  I used the Metzenbaum scissors to divide the anterior rectus sheath and dissected through the rectus muscle.  I identified the posterior rectus sheath and divided it.  I then identified the peritoneum and divided it.  I entered into the peritoneal cavity.  I then made a second incision in patient's retroauricular region.  We used the cerebellar retractor for exposure.  I then used a high-speed drill to create a right retroauricular bur hole.  I then used a shunt passer to pass the ventriculoperitoneal shunt from the scalp  incision to the abdominal incision.  I coagulated the dura with electrocautery.  I then cannulated the patient's ventricular system with the ventricular catheter on the first pass at approximately 3 cm.  I threaded the catheter over the stylette to approximately 9 cm.  There was vigorous good flow of cerebrospinal fluid.  I connected the ventricular catheter to the Rickham reservoir.  We secured the connection with a silk tie.  Then pulled the slack out of the peritoneal catheter and seated the ventricular peritoneal shunt valve.  I secured the valve to the periosteum with a figure-of-eight 0 Vicryl suture.  There was good flow of CSF through the distal end of the perineal catheter.  We then inserted the renal catheter into the peritoneum.  We then removed the retractors.  The scalp incision was closed with 2-0 Vicryl suture in the galea and stainless steel staples in the skin.  The abdominal incision was repaired with 0 Vicryl suture in the anterior rectus sheath.  2-0 Vicryl was used to close the subcutaneous tissue.  Stainless steel staples reapproximate the skin at the abdominal incision.  The wounds were then coated with bacitracin ointment.  A sterile dressing was applied.  The drapes were removed.  By report all sponge, instrument, and needle counts were correct at the end of this case.

## 2017-07-03 NOTE — Plan of Care (Signed)
SpO2 98% on RA. Resp e/u. NAD. VSS

## 2017-07-03 NOTE — Progress Notes (Signed)
Subjective: The patient is alert and pleasant.  She is in no apparent distress.  She looks well.  Objective: Vital signs in last 24 hours: Temp:  [97.7 F (36.5 C)-98.4 F (36.9 C)] (P) 97.7 F (36.5 C) (06/18 1040) Pulse Rate:  [73-91] 89 (06/18 1040) Resp:  [17-18] 18 (06/18 1040) BP: (158-162)/(66-73) 158/73 (06/18 1040) SpO2:  [97 %-100 %] 97 % (06/18 1040) Weight:  [55.3 kg (122 lb)] 55.3 kg (122 lb) (06/18 0711) Estimated body mass index is 20.3 kg/m as calculated from the following:   Height as of this encounter: 5' 5"  (1.651 m).   Weight as of this encounter: 55.3 kg (122 lb).   Intake/Output from previous day: No intake/output data recorded. Intake/Output this shift: Total I/O In: 600 [I.V.:600] Out: 10 [Blood:10]  Physical exam the patient is alert and pleasant.  She is moving all 4 extremities well.  Her dressings are clean and dry.  Lab Results: No results for input(s): WBC, HGB, HCT, PLT in the last 72 hours. BMET No results for input(s): NA, K, CL, CO2, GLUCOSE, BUN, CREATININE, CALCIUM in the last 72 hours.  Studies/Results: No results found.  Assessment/Plan: The patient is doing well.  LOS: 0 days     Ophelia Charter 07/03/2017, 10:46 AM

## 2017-07-03 NOTE — Transfer of Care (Signed)
Immediate Anesthesia Transfer of Care Note  Patient: Krista Evans  Procedure(s) Performed: SHUNT INSERTION VENTRICULAR-PERITONEAL (Right Head)  Patient Location: PACU  Anesthesia Type:General  Level of Consciousness: alert , oriented, drowsy and patient cooperative  Airway & Oxygen Therapy: Patient Spontanous Breathing  Post-op Assessment: Report given to RN, Post -op Vital signs reviewed and stable and Patient moving all extremities X 4  Post vital signs: Reviewed and stable  Last Vitals:  Vitals Value Taken Time  BP 158/73 07/03/2017 10:37 AM  Temp    Pulse 90 07/03/2017 10:40 AM  Resp 16 07/03/2017 10:40 AM  SpO2 97 % 07/03/2017 10:40 AM  Vitals shown include unvalidated device data.  Last Pain:  Vitals:   07/03/17 0727  TempSrc:   PainSc: 0-No pain      Patients Stated Pain Goal: 0 (02/06/22 1146)  Complications: No apparent anesthesia complications

## 2017-07-03 NOTE — H&P (Signed)
Subjective: The patient is a 72 year old white female who has complained of gait instability, memory changes and incontinence.  She was worked up with a brain MRI which demonstrated ventriculomegaly.  I discussed the various treatment options with the patient.  She has decided to proceed with placement of a ventriculoperitoneal shunt.  Past Medical History:  Diagnosis Date  . Abnormal Q waves on electrocardiogram   . Anemia   . Aortic atherosclerosis ([place])    "I could possibly have it, my grandmother had it"  . Cancer [facility]) 05/2011   Right upper Lung CA with partial Lobectomy.  . Celiac disease   . Celiac disease   . Dyspnea    with exertion  . Hyperlipidemia   . Hyperlipidemia   . Lung mass   . Meningioma ([place])   . Osteoporosis     Past Surgical History:  Procedure Laterality Date  . LUNG LOBECTOMY     right lung    Allergies  Allergen Reactions  . Barley Grass Other (See Comments)    And rye Doesn't absorb in small intestines  . Gluten Meal Other (See Comments)    Cant absorb in small intestines   . Wheat Bran Other (See Comments)    Doesn't absorb in small intestines    Social History   Tobacco Use  . Smoking status: Former Smoker    Types: Cigarettes    Last attempt to quit: 02/28/1986    Years since quitting: 31.3  . Smokeless tobacco: Never Used  Substance Use Topics  . Alcohol use: No    Alcohol/week: 0.0 oz    Family History  Problem Relation Age of Onset  . Breast cancer Cousin 66  . CAD Sister   . Asthma Mother   . Heart disease Mother   . Hypertension Mother   . COPD Father   . Diabetes Maternal Grandmother   . Diabetes Maternal Grandfather    Prior to Admission medications   Medication Sig Start Date End Date Taking? Authorizing Provider  aspirin EC 81 MG tablet Take 81 mg daily by mouth.   Yes [provider]  calcium carbonate (TUMS - DOSED IN MG ELEMENTAL CALCIUM) 500 MG chewable tablet Chew 2 tablets by mouth daily.    Yes [provider]  Cholecalciferol (VITAMIN D) 2000 units tablet Take 2,000 Units by mouth daily.   Yes [provider]  Multiple Vitamin (MULTI-VITAMINS) TABS Take 1 tablet by mouth daily.    Yes [provider]     Review of Systems  Positive ROS: As above  All other systems have been reviewed and were otherwise negative with the exception of those mentioned in the HPI and as above.  Objective: Vital signs in last 24 hours: Temp:  [98.4 F (36.9 C)] 98.4 F (36.9 C) (06/18 0711) Pulse Rate:  [73] 73 (06/18 0711) Resp:  [18] 18 (06/18 0711) BP: (162)/(66) 162/66 (06/18 0711) SpO2:  [98 %] 98 % (06/18 0711) Weight:  [55.3 kg (122 lb)] 55.3 kg (122 lb) (06/18 0711) Estimated body mass index is 20.3 kg/m as calculated from the following:   Height as of this encounter: 5' 5"  (1.651 m).   Weight as of this encounter: 55.3 kg (122 lb).   General Appearance: Alert Head: Normocephalic, without obvious abnormality, atraumatic Eyes: PERRL, conjunctiva/corneas clear, EOM's intact,    Ears: Normal  Throat: Normal  Neck: Supple, Back: unremarkable Lungs: Clear to auscultation bilaterally, respirations unlabored Heart: Regular rate and rhythm, no murmur, rub  or gallop Abdomen: Soft, non-tender Extremities: Extremities normal, atraumatic, no cyanosis or edema Skin: unremarkable  NEUROLOGIC:   Mental status: alert and oriented,Motor Exam - grossly normal Sensory Exam - grossly normal Reflexes:  Coordination - grossly normal Gait -unsteady Balance -unsteady Cranial Nerves: I: smell Not tested  II: visual acuity  OS: Normal  OD: Normal   II: visual fields Full to confrontation  II: pupils Equal, round, reactive to light  III,VII: ptosis None  III,IV,VI: extraocular muscles  Full ROM  V: mastication Normal  V: facial light touch sensation  Normal  V,VII: corneal reflex  Present  VII: facial muscle function - upper  Normal  VII: facial muscle function - lower  Normal  VIII: hearing Not tested  IX: soft palate elevation  Normal  IX,X: gag reflex Present  XI: trapezius strength  5/5  XI: sternocleidomastoid strength 5/5  XI: neck flexion strength  5/5  XII: tongue strength  Normal    Data Review Lab Results  Component Value Date   WBC 6.1 06/27/2017   HGB 11.4 (L) 06/27/2017   HCT 35.0 (L) 06/27/2017   MCV 94.1 06/27/2017   PLT 257 06/27/2017   Lab Results  Component Value Date   NA 140 06/27/2017   K 3.8 06/27/2017   CL 106 06/27/2017   CO2 24 06/27/2017   BUN 13 06/27/2017   CREATININE 0.77 06/27/2017   GLUCOSE 90 06/27/2017   Lab Results  Component Value Date   INR 0.8 06/29/2011    Assessment/Plan: Hydrocephalus: I have discussed the situation with the patient and her son.  I have reviewed her MRI scan with him and pointed out the abnormalities.  We have discussed the various treatment options including surgery.  I have described the surgical treatment option of a placement of a right ventriculoperitoneal shunt.  Have given her a pamphlet on hydrocephalus.  We have discussed the risks, benefits, alternatives, expected postoperative course, and likelihood of achieving our goals with surgery.  I have answered all the patient's questions.  She has decided to proceed with placement of a right ventriculoperitoneal shunt.   Krista Evans 07/03/2017 8:48 AM

## 2017-07-03 NOTE — Progress Notes (Signed)
Patient transferred to 4N27, receiving RN at bedside, no questions from patient or RN, patient placed on tele, notified RN of giving 65m of labetalol for systolic BP > 1923 Patient family updated of pts location.  BRowe Pavy RN

## 2017-07-04 ENCOUNTER — Encounter (HOSPITAL_COMMUNITY): Payer: Self-pay | Admitting: Neurosurgery

## 2017-07-04 ENCOUNTER — Inpatient Hospital Stay (HOSPITAL_COMMUNITY): Payer: PPO

## 2017-07-04 NOTE — Progress Notes (Signed)
Subjective: Patient reports mild head and abdomen soreness. Denies any headaches. Still feels weak  Objective: Vital signs in last 24 hours: Temp:  [97.5 F (36.4 C)-97.7 F (36.5 C)] 97.6 F (36.4 C) (06/19 0400) Pulse Rate:  [67-93] 69 (06/19 0700) Resp:  [12-21] 21 (06/19 0700) BP: (103-167)/(49-83) 131/60 (06/19 0700) SpO2:  [92 %-100 %] 96 % (06/19 0700) Weight:  [57.4 kg (126 lb 8.7 oz)] 57.4 kg (126 lb 8.7 oz) (06/18 1155)  Intake/Output from previous day: 06/18 0701 - 06/19 0700 In: 1872.5 [I.V.:1872.5] Out: 1685 [Urine:1675; Blood:10] Intake/Output this shift: No intake/output data recorded.  Neurologic: Grossly normal  Lab Results: Lab Results  Component Value Date   WBC 6.1 06/27/2017   HGB 11.4 (L) 06/27/2017   HCT 35.0 (L) 06/27/2017   MCV 94.1 06/27/2017   PLT 257 06/27/2017   Lab Results  Component Value Date   INR 0.8 06/29/2011   BMET Lab Results  Component Value Date   NA 140 06/27/2017   K 3.8 06/27/2017   CL 106 06/27/2017   CO2 24 06/27/2017   GLUCOSE 90 06/27/2017   BUN 13 06/27/2017   CREATININE 0.77 06/27/2017   CALCIUM 9.4 06/27/2017    Studies/Results: Ct Head Wo Contrast  Result Date: 07/04/2017 CLINICAL DATA:  72 y/o  F; follow-up post shunt. EXAM: CT HEAD WITHOUT CONTRAST TECHNIQUE: Contiguous axial images were obtained from the base of the skull through the vertex without intravenous contrast. COMPARISON:  03/17/2017 MRI of the head. FINDINGS: Brain: Interval placement of a right parietal approach ventriculostomy catheter with tip in the frontal horn of right lateral ventricle abutting the septum pellucidum. Stable ventricle size. Trace volume of pneumocephalus overlying right frontal lobe. No acute hemorrhage, stroke, or mass effect. Stable chronic microvascular ischemic changes and parenchymal volume loss of the brain. Stable right frontal convexity meningioma without mass effect. Vascular: Calcific atherosclerosis of the carotid  siphons. Skull: Right parietal burr hole postsurgical changes and postsurgical changes in the scalp along the course of the ventriculostomy catheter. Sinuses/Orbits: No acute finding. Other: None. IMPRESSION: 1. Postsurgical changes related to right parietal burr hole and parietal approach ventriculostomy catheter. Trace pneumocephalus overlying right frontal lobe. 2. No acute abnormality identified. 3. Stable ventricle size, chronic microvascular ischemic changes, and parenchymal volume loss of the brain. Electronically Signed   By: Kristine Garbe M.D.   On: 07/04/2017 05:23    Assessment/Plan: PT/OT today for possible evaluation of SNF placement? Doing well. Will transfer to floor   LOS: 1 day    Miller 07/04/2017, 8:02 AM

## 2017-07-05 DIAGNOSIS — G910 Communicating hydrocephalus: Principal | ICD-10-CM

## 2017-07-05 DIAGNOSIS — R269 Unspecified abnormalities of gait and mobility: Secondary | ICD-10-CM

## 2017-07-05 NOTE — Evaluation (Signed)
Occupational Therapy Evaluation Patient Details Name: Krista Evans MRN: [id number] DOB: 1945-10-13 Today's Date: 07/05/2017    History of Present Illness The patient is a 72 year old white female who has complained of gait instability, memory changes and incontinence. 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    Clinical Impression   Pt reports she was independent with ADL PTA; husband only assisted for shower transfers. Currently pt requires min assist for ADL and functional mobility. Pt presenting with impaired cognition, generalized weakness, L hand fine motor coordination deficits, poor balance, and decreased activity tolerance impacting her independence and safety with ADL and functional mobility. Recommending CIR level therapies to maximize independence and safety with ADL and functional mobility prior to return home. Pt would benefit from continued skilled OT to address established goals.    Follow Up Recommendations  CIR;Supervision/Assistance - 24 hour    Equipment Recommendations  Other (comment)(TBD at next venue)    Recommendations for Other Services Rehab consult     Precautions / Restrictions Precautions Precautions: Fall Restrictions Weight Bearing Restrictions: No      Mobility Bed Mobility               General bed mobility comments: Pt OOB in chair upon arrival  Transfers Overall transfer level: Needs assistance Equipment used: Rolling walker (2 wheeled) Transfers: Sit to/from Stand Sit to Stand: Min assist Stand pivot transfers: Min assist       General transfer comment: verbal cues for hand placement. Assist to power up.     Balance Overall balance assessment: Needs assistance Sitting-balance support: No upper extremity supported;Feet supported Sitting balance-Leahy Scale: Good     Standing balance support: Bilateral upper extremity  supported;During functional activity Standing balance-Leahy Scale: Poor Standing balance comment: reliant on RW                            ADL either performed or assessed with clinical judgement   ADL Overall ADL's : Needs assistance/impaired Eating/Feeding: Set up;Sitting   Grooming: Minimal assistance;Sitting   Upper Body Bathing: Min guard;Sitting   Lower Body Bathing: Minimal assistance;Sit to/from stand   Upper Body Dressing : Min guard;Sitting   Lower Body Dressing: Minimal assistance;Sit to/from stand   Toilet Transfer: Minimal assistance;Ambulation;RW Toilet Transfer Details (indicate cue type and reason): Simulated by sit to stand with functional mobility          Functional mobility during ADLs: Minimal assistance;Rolling walker General ADL Comments: Pt reports fatigue with mobility; requires frequent rest breaks     Vision         Perception     Praxis      Pertinent Vitals/Pain Pain Assessment: Faces Faces Pain Scale: Hurts little more Pain Location: head/neck Pain Descriptors / Indicators: Aching;Grimacing Pain Intervention(s): Monitored during session;Premedicated before session;Repositioned     Hand Dominance Left   Extremity/Trunk Assessment Upper Extremity Assessment Upper Extremity Assessment: LUE deficits/detail LUE Deficits / Details: Pt c/o fine motor coordination deficits since fall ~1 year ago; has difficulty signing name LUE Coordination: decreased fine motor   Lower Extremity Assessment Lower Extremity Assessment: Defer to PT evaluation   Cervical / Trunk Assessment Cervical / Trunk Assessment: Normal   Communication Communication Communication: HOH   Cognition Arousal/Alertness: Awake/alert Behavior During Therapy: Flat affect Overall Cognitive Status: Impaired/Different from baseline Area of Impairment: Safety/judgement;Problem solving;Memory;Following commands  Memory: Decreased  short-term memory Following Commands: Follows one step commands consistently;Follows multi-step commands inconsistently;Follows multi-step commands with increased time Safety/Judgement: Decreased awareness of deficits;Decreased awareness of safety   Problem Solving: Slow processing;Difficulty sequencing;Requires verbal cues     General Comments       Exercises     Shoulder Instructions      Home Living Family/patient expects to be discharged to:: Private residence Living Arrangements: Spouse/significant other Available Help at Discharge: Family;Available 24 hours/day Type of Home: House Home Access: Stairs to enter [employer] of Steps: 3 Entrance Stairs-Rails: None Home Layout: Two level;Able to live on main level with bedroom/bathroom     Bathroom Shower/Tub: Occupational psychologist: Standard     Home Equipment: Environmental consultant - 4 wheels;Walker - 2 wheels;Shower seat - built in          Prior Functioning/Environment Level of Independence: Needs assistance  Gait / Transfers Assistance Needed: ambulates mod I with [id number] ADL's / Homemaking Assistance Needed: husband assists with transfer in/out of shower and provides set up            OT Problem List: Decreased strength;Decreased activity tolerance;Impaired balance (sitting and/or standing);Decreased cognition;Decreased knowledge of use of DME or AE;Impaired sensation;Impaired UE functional use      OT Treatment/Interventions: Self-care/ADL training;Therapeutic exercise;Energy conservation;Neuromuscular education;DME and/or AE instruction;Therapeutic activities;Cognitive remediation/compensation;Patient/family education;Balance training    OT Goals(Current goals can be found in the care plan section) Acute Rehab OT Goals Patient Stated Goal: to drive and go shopping OT Goal Formulation: With patient/family Time For Goal Achievement: 07/19/17 Potential to Achieve Goals: Good ADL Goals Pt Will Perform  Grooming: with supervision;standing Pt Will Perform Upper Body Bathing: with set-up;sitting Pt Will Perform Lower Body Bathing: with supervision;sit to/from stand Pt Will Transfer to Toilet: with supervision;ambulating;regular height toilet Pt Will Perform Toileting - Clothing Manipulation and hygiene: with supervision;sit to/from stand Pt/caregiver will Perform Home Exercise Program: Left upper extremity;With theraputty;Independently;With written HEP provided(increase fine motor coordination)  OT Frequency: Min 2X/week   Barriers to D/C:            Co-evaluation PT/OT/SLP Co-Evaluation/Treatment: Yes Reason for Co-Treatment: Necessary to address cognition/behavior during functional activity;For patient/therapist safety PT goals addressed during session: Mobility/safety with mobility OT goals addressed during session: ADL's and self-care      AM-PAC PT "6 Clicks" Daily Activity     Outcome Measure Help from another person eating meals?: A Little Help from another person taking care of personal grooming?: A Little Help from another person toileting, which includes using toliet, bedpan, or urinal?: A Little Help from another person bathing (including washing, rinsing, drying)?: A Little Help from another person to put on and taking off regular upper body clothing?: A Little Help from another person to put on and taking off regular lower body clothing?: A Little 6 Click Score: 18   End of Session Equipment Utilized During Treatment: Gait belt;Rolling walker Nurse Communication: Mobility status  Activity Tolerance: Patient tolerated treatment well Patient left: in chair;with call bell/phone within reach;with family/visitor present  OT Visit Diagnosis: Unsteadiness on feet (R26.81);Other abnormalities of gait and mobility (R26.89);History of falling (Z91.81);Muscle weakness (generalized) (M62.81)                Time: 5956-3875 OT Time Calculation (min): 24 min Charges:  OT General  Charges $OT Visit: 1 Visit OT Evaluation $OT Eval Moderate Complexity: 1 Mod G-Codes:     Malisa Ruggiero A. Ulice Brilliant, M.S., OTR/L Acute Rehab Department: [phone]  Binnie Kand 07/05/2017, 10:52 AM

## 2017-07-05 NOTE — Consult Note (Signed)
Physical Medicine and Rehabilitation Consult   Reason for Consult: NPH with gait disorder Referring Physician: Dr. Arnoldo Morale   HPI: Krista Evans is a 72 y.o. female with history of lung cancer, pelvic fracture with left foot drop, celiac disease, who has had worsening of gait disorder with bladder incontinence and memory difficulties due to NPH. She was admitted on 07/03/17 for placement of VPS by Dr. Arnoldo Morale. Therapy evaluations revealed weakness with balance deficits and cognitive deficits affecting ADLs and mobility. CIR recommended for follow up therapy.   Patient states her pelvic fracture was greater than 1 year ago.  Her lung cancer was back in 2013.  Her worsening of gait has been over the last 2 months or so.  She does feel like her ambulation distance today is greater than it has been for a couple months.  Her husband can assist to some degree at home however the patient states that he would likely not be of much help.  Review of Systems  Constitutional: Negative for chills and fever.  HENT: Positive for tinnitus (chronic in right ear). Negative for hearing loss.   Eyes: Negative for blurred vision and double vision.  Respiratory: Negative for cough and shortness of breath.   Cardiovascular: Negative for chest pain, palpitations and leg swelling.  Gastrointestinal: Negative for heartburn and nausea.  Genitourinary: Negative for frequency and urgency.  Musculoskeletal: Positive for back pain.  Skin: Negative for itching.  Neurological: Positive for sensory change (LLE) and headaches (since surgery).  Psychiatric/Behavioral: Negative for memory loss.      Past Medical History:  Diagnosis Date  . Abnormal Q waves on electrocardiogram   . Anemia   . Aortic atherosclerosis ([place])    "I could possibly have it, my grandmother had it"  . Cancer [facility]) 05/2011   Right upper Lung CA with partial Lobectomy.  . Celiac disease   . Celiac disease   . Dyspnea    with exertion    . Hyperlipidemia   . Hyperlipidemia   . Lung mass   . Meningioma ([place])   . Osteoporosis     Past Surgical History:  Procedure Laterality Date  . LUNG LOBECTOMY     right lung  . 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 Breast cancer Cousin 26  . CAD Sister   . Asthma Mother   . Heart disease Mother   . Hypertension Mother   . COPD Father   . Diabetes Maternal Grandmother   . Diabetes Maternal Grandfather     Social History:   Married. Independent with AD PTA. Retired Corporate treasurer.  Husband supportive and has been managing home for a few months. She reports that she quit smoking about 31 years ago. Her smoking use included cigarettes. She has never used smokeless tobacco. She reports that she does not drink alcohol or use drugs.    Allergies  Allergen Reactions  . Barley Grass Other (See Comments)    And rye Doesn't absorb in small intestines  . Gluten Meal Other (See Comments)    Cant absorb in small intestines   . Wheat Bran Other (See Comments)    Doesn't absorb in small intestines    Medications Prior to Admission  Medication Sig Dispense Refill  . aspirin EC 81 MG tablet Take 81 mg  daily by mouth.    . calcium carbonate (TUMS - DOSED IN MG ELEMENTAL CALCIUM) 500 MG chewable tablet Chew 2 tablets by mouth daily.     . Cholecalciferol (VITAMIN D) 2000 units tablet Take 2,000 Units by mouth daily.    . Multiple Vitamin (MULTI-VITAMINS) TABS Take 1 tablet by mouth daily.       Home: Home Living Family/patient expects to be discharged to:: Private residence Living Arrangements: Spouse/significant other Available Help at Discharge: Family, Available 24 hours/day Type of Home: House Home Access: Stairs to enter [employer] of Steps: 3 Entrance Stairs-Rails:  None Home Layout: Two level, Able to live on main level with bedroom/bathroom Bathroom Shower/Tub: Multimedia programmer: Bailey: Environmental consultant - 4 wheels, Environmental consultant - 2 wheels, Shower seat - built in  Functional History: Prior Function Level of Independence: Needs assistance Gait / Transfers Assistance Needed: ambulates mod I with 5WL ADL's / Homemaking Assistance Needed: husband assists with transfer in/out of shower and provides set up Functional Status:  Mobility: Bed Mobility General bed mobility comments: Pt OOB in chair upon arrival Transfers Overall transfer level: Needs assistance Equipment used: Rolling walker (2 wheeled) Transfers: Sit to/from Stand Sit to Stand: Min assist Stand pivot transfers: Min assist General transfer comment: verbal cues for hand placement. Assist to power up.  Ambulation/Gait Ambulation/Gait assistance: Min assist Gait Distance (Feet): 120 Feet Assistive device: Rolling walker (2 wheeled) Gait Pattern/deviations: Step-through pattern, Decreased stride length General Gait Details: stiff-leg gait with decreased knee flexion bilat. Pt with several episodes of bilat LE 'freezing' during ambulation requiring cues/assist to re-initiate mobility.  Gait velocity: decreased Gait velocity interpretation: <1.31 ft/sec, indicative of household ambulator    ADL: ADL Overall ADL's : Needs assistance/impaired Eating/Feeding: Set up, Sitting Grooming: Minimal assistance, Sitting Upper Body Bathing: Min guard, Sitting Lower Body Bathing: Minimal assistance, Sit to/from stand Upper Body Dressing : Min guard, Sitting Lower Body Dressing: Minimal assistance, Sit to/from stand Toilet Transfer: Minimal assistance, Ambulation, RW Toilet Transfer Details (indicate cue type and reason): Simulated by sit to stand with functional mobility  Functional mobility during ADLs: Minimal assistance, Rolling walker General ADL Comments: Pt reports fatigue  with mobility; requires frequent rest breaks  Cognition: Cognition Overall Cognitive Status: Impaired/Different from baseline Orientation Level: Oriented X4 Cognition Arousal/Alertness: Awake/alert Behavior During Therapy: Flat affect Overall Cognitive Status: Impaired/Different from baseline Area of Impairment: Safety/judgement, Problem solving, Memory, Following commands Memory: Decreased short-term memory Following Commands: Follows one step commands consistently, Follows multi-step commands inconsistently, Follows multi-step commands with increased time Safety/Judgement: Decreased awareness of deficits, Decreased awareness of safety Problem Solving: Slow processing, Difficulty sequencing, Requires verbal cues   Blood pressure (!) 156/65, pulse 77, temperature 97.9 F (36.6 C), temperature source Oral, resp. rate 17, height 5' 5"  (1.651 m), weight 57.4 kg (126 lb 8.7 oz), SpO2 96 %. Physical Exam  Nursing note and vitals reviewed. Constitutional: She is oriented to person, place, and time. She appears well-developed and well-nourished.  HENT:  Head: Normocephalic and atraumatic.  Eyes: Pupils are equal, round, and reactive to light. Conjunctivae are normal.  Neck: Normal range of motion.  Cardiovascular: Normal rate and regular rhythm.  No murmur heard. Respiratory: Effort normal and breath sounds normal.  GI: Soft. Bowel sounds are normal. She exhibits no distension.  Neurological: She is alert and oriented to person, place, and time.  Motor strength is 5/5 bilateral deltoid, bicep, tricep, grip 4/5 bilateral hip flexor knee extensor ankle dorsiflex. Sensation intact light  touch bilateral upper and lower limb Patient has mild dysmetria left finger-nose-finger normal on the right side.   Skin: Skin is warm and dry.  Abdominal incision with dressing.  Psychiatric: She has a normal mood and affect.    No results found for this or any previous visit (from the past 24  hour(s)). Ct Head Wo Contrast  Result Date: 07/04/2017 CLINICAL DATA:  72 y/o  F; follow-up post shunt. EXAM: CT HEAD WITHOUT CONTRAST TECHNIQUE: Contiguous axial images were obtained from the base of the skull through the vertex without intravenous contrast. COMPARISON:  03/17/2017 MRI of the head. FINDINGS: Brain: Interval placement of a right parietal approach ventriculostomy catheter with tip in the frontal horn of right lateral ventricle abutting the septum pellucidum. Stable ventricle size. Trace volume of pneumocephalus overlying right frontal lobe. No acute hemorrhage, stroke, or mass effect. Stable chronic microvascular ischemic changes and parenchymal volume loss of the brain. Stable right frontal convexity meningioma without mass effect. Vascular: Calcific atherosclerosis of the carotid siphons. Skull: Right parietal burr hole postsurgical changes and postsurgical changes in the scalp along the course of the ventriculostomy catheter. Sinuses/Orbits: No acute finding. Other: None. IMPRESSION: 1. Postsurgical changes related to right parietal burr hole and parietal approach ventriculostomy catheter. Trace pneumocephalus overlying right frontal lobe. 2. No acute abnormality identified. 3. Stable ventricle size, chronic microvascular ischemic changes, and parenchymal volume loss of the brain. Electronically Signed   By: Kristine Garbe M.D.   On: 07/04/2017 05:23     Assessment/Plan: Diagnosis: Normal pressure hydrocephalus with gait disorder. 1. Does the need for close, 24 hr/day medical supervision in concert with the patient's rehab needs make it unreasonable for this patient to be served in a less intensive setting? Potentially 2. Co-Morbidities requiring supervision/potential complications: History of osteoporosis 3. Due to safety, skin/wound care, medication administration, pain management and patient education, does the patient require 24 hr/day rehab nursing? Potentially 4. Does  the patient require coordinated care of a physician, rehab nurse, PT (1-2 hrs/day, 5 days/week) and OT (1-2 hrs/day, 5 days/week) to address physical and functional deficits in the context of the above medical diagnosis(es)? Potentially Addressing deficits in the following areas: balance, endurance, locomotion, strength, transferring, bowel/bladder control, bathing, dressing, feeding and toileting 5. Can the patient actively participate in an intensive therapy program of at least 3 hrs of therapy per day at least 5 days per week? Yes 6. The potential for patient to make measurable gains while on inpatient rehab is good 7. Anticipated functional outcomes upon discharge from inpatient rehab are modified independent  with PT, modified independent with OT, n/a with SLP. 8. Estimated rehab length of stay to reach the above functional goals is: 7d 9. Anticipated D/C setting: Home 10. Anticipated post D/C treatments: Foreman therapy 11. Overall Rehab/Functional Prognosis: excellent  RECOMMENDATIONS: This patient's condition is appropriate for continued rehabilitative care in the following setting: If patient remains a current functional levels, would recommend short CIR stay if patient progresses to supervision can go home with husband supervision Patient has agreed to participate in recommended program. Yes Note that insurance prior authorization may be required for reimbursement for recommended care.  Comment:   "I have personally performed a face to face diagnostic evaluation of this patient.  Additionally, I have reviewed and concur with the physician assistant's documentation above." Charlett Blake M.D. [facility] FAAPM&R (Sports Med, Neuromuscular Med) Diplomate Am Board of [place], PA-C 07/05/2017

## 2017-07-05 NOTE — Evaluation (Signed)
Physical Therapy Evaluation Patient Details Name: Krista Evans MRN: [id number] DOB: 1945-09-20 Today's Date: 07/05/2017   History of Present Illness  The patient is a 72 year old white female who has complained of gait instability, memory changes and incontinence. 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     Clinical Impression  Pt admitted with above diagnosis. Pt currently with functional limitations due to the deficits listed below (see PT Problem List). PTA pt lived at home with her husband, mod I mobility using a RW. Prior to her fall April 2018 pt was independent, no AD needed and still driving. On eval, she required min assist transfers and min assist ambulation 120 feet with RW. She presents with flat affect, decreased balance and decreased activity tolerance. See below for further details. Pt will benefit from skilled PT to increase their independence and safety with mobility to allow discharge to the venue listed below.       Follow Up Recommendations CIR    Equipment Recommendations  None recommended by PT    Recommendations for Other Services       Precautions / Restrictions Precautions Precautions: Fall Restrictions Weight Bearing Restrictions: No      Mobility  Bed Mobility               General bed mobility comments: Pt received in recliner.  Transfers Overall transfer level: Needs assistance Equipment used: Rolling walker (2 wheeled) Transfers: Sit to/from [facility] Sit to Stand: Min assist Stand pivot transfers: Min assist       General transfer comment: verbal cues for hand placement. Assist to power up.   Ambulation/Gait Ambulation/Gait assistance: Min assist Gait Distance (Feet): 120 Feet Assistive device: Rolling walker (2 wheeled) Gait Pattern/deviations: Step-through pattern;Decreased stride length Gait velocity:  decreased Gait velocity interpretation: <1.31 ft/sec, indicative of household ambulator General Gait Details: stiff-leg gait with decreased knee flexion bilat. Pt with several episodes of bilat LE 'freezing' during ambulation requiring cues/assist to re-initiate mobility.   Stairs            Wheelchair Mobility    Modified Rankin (Stroke Patients Only)       Balance Overall balance assessment: Needs assistance Sitting-balance support: No upper extremity supported;Feet supported Sitting balance-Leahy Scale: Good     Standing balance support: Bilateral upper extremity supported;During functional activity Standing balance-Leahy Scale: Poor Standing balance comment: reliant on RW                              Pertinent Vitals/Pain Pain Assessment: Faces Faces Pain Scale: Hurts little more Pain Location: head/neck Pain Descriptors / Indicators: Aching;Grimacing Pain Intervention(s): Monitored during session;Premedicated before session;Repositioned    Home Living Family/patient expects to be discharged to:: Private residence Living Arrangements: Spouse/significant other Available Help at Discharge: Family;Available 24 hours/day Type of Home: House Home Access: Stairs to enter Entrance Stairs-Rails: None Entrance Stairs-Number of Steps: 3 Home Layout: Two level;Able to live on main level with bedroom/bathroom Home Equipment: Walker - 4 wheels;Walker - 2 wheels      Prior Function Level of Independence: Needs assistance   Gait / Transfers Assistance Needed: ambulates mod I with 7QI  ADL's / Homemaking Assistance Needed: husband assists with transfer in/out of shower and provides set up        Hand Dominance   Dominant Hand: Left    Extremity/Trunk  Assessment   Upper Extremity Assessment Upper Extremity Assessment: Defer to OT evaluation    Lower Extremity Assessment Lower Extremity Assessment: Generalized weakness    Cervical / Trunk  Assessment Cervical / Trunk Assessment: Normal  Communication   Communication: HOH  Cognition Arousal/Alertness: Awake/alert Behavior During Therapy: Flat affect Overall Cognitive Status: Impaired/Different from baseline Area of Impairment: Safety/judgement;Problem solving;Memory;Following commands                     Memory: Decreased short-term memory Following Commands: Follows one step commands consistently;Follows multi-step commands inconsistently;Follows multi-step commands with increased time Safety/Judgement: Decreased awareness of deficits;Decreased awareness of safety   Problem Solving: Slow processing;Difficulty sequencing;Requires verbal cues        General Comments      Exercises     Assessment/Plan    PT Assessment Patient needs continued PT services  PT Problem List Decreased strength;Decreased mobility;Decreased safety awareness;Decreased coordination;Decreased activity tolerance;Decreased cognition;Pain;Decreased balance       PT Treatment Interventions DME instruction;Therapeutic activities;Cognitive remediation;Gait training;Therapeutic exercise;Patient/family education;Balance training;Stair training;Functional mobility training    PT Goals (Current goals can be found in the Care Plan section)  Acute Rehab PT Goals Patient Stated Goal: to drive and go shopping PT Goal Formulation: With patient/family Time For Goal Achievement: 07/19/17 Potential to Achieve Goals: Good    Frequency Min 3X/week   Barriers to discharge        Co-evaluation PT/OT/SLP Co-Evaluation/Treatment: Yes Reason for Co-Treatment: Necessary to address cognition/behavior during functional activity PT goals addressed during session: Mobility/safety with mobility         AM-PAC PT "6 Clicks" Daily Activity  Outcome Measure Difficulty turning over in bed (including adjusting bedclothes, sheets and blankets)?: A Little Difficulty moving from lying on back to sitting  on the side of the bed? : A Lot Difficulty sitting down on and standing up from a chair with arms (e.g., wheelchair, bedside commode, etc,.)?: A Lot Help needed moving to and from a bed to chair (including a wheelchair)?: A Little Help needed walking in hospital room?: A Little Help needed climbing 3-5 steps with a railing? : A Lot 6 Click Score: 15    End of Session Equipment Utilized During Treatment: Gait belt Activity Tolerance: Patient tolerated treatment well Patient left: in chair;with call bell/phone within reach;with family/visitor present Nurse Communication: Mobility status PT Visit Diagnosis: Other abnormalities of gait and mobility (R26.89);Pain;Difficulty in walking, not elsewhere classified (R26.2)    Time: 9791-5041 PT Time Calculation (min) (ACUTE ONLY): 29 min   Charges:   PT Evaluation $PT Eval Moderate Complexity: 1 Mod PT Treatments $Therapeutic Activity: 8-22 mins   PT G Codes:        Lorrin Goodell, PT  Office # [phone] Pager [phone]   Lorriane Shire 07/05/2017, 10:36 AM

## 2017-07-05 NOTE — Progress Notes (Signed)
Spoke with pt son who is currently in [place]. He stated he spoke with his mother today and she told him she would be discharged home. His concern is that his father is unable to help her. He feels that should would best benefit from in patient PT to increase her strength. He would like for someone to call him on Friday 07/06/17 to address these issues with him. His name and number are in the chart. Will report this to day shift nurse. Tori Cupps, 07/05/2017, 9:50 PM

## 2017-07-05 NOTE — Progress Notes (Signed)
Doing well, a little sore, no headache, no NTW, walking ok, PT/OT rec CIR, incisions CDI

## 2017-07-06 NOTE — Progress Notes (Signed)
Physical Therapy Treatment Patient Details Name: Krista Evans MRN: [id number] DOB: 1945-06-12 Today's Date: 07/06/2017    History of Present Illness The patient is a 72 year old white female who has complained of gait instability, memory changes and incontinence. 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     PT Comments    Pt in bed on arrival, very pleasant and eager to participate in therapy. Pt required min assist transfers and ambulation 70 feet with RW. Starting/stopping episodes still present during gait with cues needed for more fluid pattern. Pt positioned in chair at end of session. Pt declining exercises due to her pastor arriving. PT to continue per POC.    Follow Up Recommendations  CIR     Equipment Recommendations  None recommended by PT    Recommendations for Other Services       Precautions / Restrictions Precautions Precautions: Fall    Mobility  Bed Mobility Overal bed mobility: Needs Assistance Bed Mobility: Supine to Sit     Supine to sit: Min guard;HOB elevated     General bed mobility comments: +rail, increased time and effort  Transfers Overall transfer level: Needs assistance Equipment used: Rolling walker (2 wheeled) Transfers: Sit to/from Stand Sit to Stand: Min assist         General transfer comment: cues for hand placement, increased time to stabilize initial standing balance  Ambulation/Gait Ambulation/Gait assistance: Min assist Gait Distance (Feet): 70 Feet Assistive device: Rolling walker (2 wheeled) Gait Pattern/deviations: Step-through pattern;Decreased stride length;Wide base of support Gait velocity: decreased Gait velocity interpretation: <1.31 ft/sec, indicative of household ambulator General Gait Details: decreased knee flexion bilat with wide BOS. Assist with RW management and cues to stay on task. Multi standing rest breaks  due to fatigue. Pt with c/o dizziness.   Stairs             Wheelchair Mobility    Modified Rankin (Stroke Patients Only)       Balance Overall balance assessment: Needs assistance Sitting-balance support: No upper extremity supported;Feet supported Sitting balance-Leahy Scale: Good     Standing balance support: Bilateral upper extremity supported;During functional activity Standing balance-Leahy Scale: Poor Standing balance comment: reliant on RW and external support                            Cognition Arousal/Alertness: Awake/alert Behavior During Therapy: WFL for tasks assessed/performed Overall Cognitive Status: Impaired/Different from baseline Area of Impairment: Safety/judgement;Problem solving;Memory;Following commands                     Memory: Decreased short-term memory Following Commands: Follows one step commands consistently;Follows multi-step commands inconsistently;Follows multi-step commands with increased time Safety/Judgement: Decreased awareness of deficits;Decreased awareness of safety   Problem Solving: Slow processing;Difficulty sequencing;Requires verbal cues        Exercises      General Comments        Pertinent Vitals/Pain Pain Assessment: 0-10 Pain Score: 4  Pain Location: head/neck Pain Descriptors / Indicators: Aching;Grimacing Pain Intervention(s): Premedicated before session;Monitored during session;Repositioned;Limited activity within patient's tolerance    Home Living                      Prior Function            PT Goals (current goals can now be found in the care  plan section) Acute Rehab PT Goals Patient Stated Goal: to drive and go shopping PT Goal Formulation: With patient/family Time For Goal Achievement: 07/19/17 Potential to Achieve Goals: Good Progress towards PT goals: Progressing toward goals    Frequency    Min 3X/week      PT Plan Current plan remains  appropriate    Co-evaluation              AM-PAC PT "6 Clicks" Daily Activity  Outcome Measure  Difficulty turning over in bed (including adjusting bedclothes, sheets and blankets)?: None Difficulty moving from lying on back to sitting on the side of the bed? : A Little Difficulty sitting down on and standing up from a chair with arms (e.g., wheelchair, bedside commode, etc,.)?: A Lot Help needed moving to and from a bed to chair (including a wheelchair)?: A Little Help needed walking in hospital room?: A Little Help needed climbing 3-5 steps with a railing? : A Lot 6 Click Score: 17    End of Session Equipment Utilized During Treatment: Gait belt Activity Tolerance: Patient tolerated treatment well Patient left: in chair;with call bell/phone within reach;with family/visitor present Nurse Communication: Mobility status PT Visit Diagnosis: Other abnormalities of gait and mobility (R26.89);Pain;Difficulty in walking, not elsewhere classified (R26.2)     Time: 0912-0925 PT Time Calculation (min) (ACUTE ONLY): 13 min  Charges:  $Gait Training: 8-22 mins                    G Codes:       Krista Evans, PT  Office # [phone] Pager [phone]    Krista Evans 07/06/2017, 10:03 AM

## 2017-07-06 NOTE — PMR Pre-admission (Signed)
PMR Admission Coordinator Pre-Admission Assessment  Patient: Krista Evans is an 72 y.o., female MRN: [id number] DOB: 1945-05-05 Height: 5' 5"  (165.1 cm) Weight: 57.4 kg (126 lb 8.7 oz)              Insurance Information HMO:     PPO: Yes     PCP:      IPA:      80/20:      OTHER:  PRIMARY: [employer] (plan type: HTA Plan 1 PPO)      Policy#: [id number]      Subscriber: Patient  CM Name: Christinia Gully      Phone#: [phone]     Fax#: [phone] Per Tommie Raymond (who gave auth on 07/07/17) updates due on day 7 (5/42/70) Pre-Cert#: [id number]      Employer:  Benefits:  Phone #: [phone]     Name: HTA portal (Online) Eff. Date: 01/16/17     Deduct: $0      Out of Pocket Max: $3,400      Life Max: NA CIR: $295/day for days 1-6; $0/day for days 7-90      SNF: $20/day for days 1-20; $160/day for days 21-100 (100 day limit) Outpatient: per necessity     Co-Pay: $15/visit Home Health: 100% per necessity     Co-Pay:  DME: 80%     Co-Pay: 20% Providers:   Medicaid Application Date:       Case Manager:  Disability Application Date:       Case Worker:   Emergency Contact Information Contact Information    Name Relation Home Work Mobile   Gilson B Spouse Soham Son [phone]       Current Medical History  Patient Admitting Diagnosis: Normal pressure hydrocephalus with gait disorder History of Present Illness: Krista Evans is a 72 y.o. female with history of lung cancer, pelvic fracture with left foot drop, celiac disease, who has had worsening of gait disorder with bladder incontinence and memory difficulties due to NPH. She was admitted on 07/03/17 for placement of VPS by Dr. Arnoldo Morale. Therapy evaluations revealed weakness with balance deficits and cognitive deficits affecting ADLs and mobility. CIR recommended for follow up therapy.   Patient states her pelvic fracture was greater than 1 year ago.  Her lung cancer was back in 2013.  Her  worsening of gait has been over the last 2 months or so.  She does feel like her ambulation distance today is greater than it has been for a couple months.  Her husband can assist to some degree at home however the patient states that he would likely not be of much help.       Past Medical History  Past Medical History:  Diagnosis Date  . Abnormal Q waves on electrocardiogram   . Anemia   . Aortic atherosclerosis ([place])    "I could possibly have it, my grandmother had it"  . Cancer [facility]) 05/2011   Right upper Lung CA with partial Lobectomy.  . Celiac disease   . Celiac disease   . Dyspnea    with exertion  . Hyperlipidemia   . Hyperlipidemia   . Lung mass   . Meningioma ([place])   . Osteoporosis     Family History  family history includes Asthma in her mother; Breast cancer (age of onset: 9) in her cousin; CAD in her sister; COPD in her father; Diabetes in her maternal grandfather and maternal grandmother; Heart disease in her mother;  Hypertension in her mother.  Prior Rehab/Hospitalizations:  Has the patient had major surgery during 100 days prior to admission? No  Current Medications   Current Facility-Administered Medications:  .  0.9 % NaCl with KCl 20 mEq/ L  infusion, , Intravenous, Continuous, Meyran, Ocie Cornfield, NP, Stopped at 07/04/17 2238 .  acetaminophen (TYLENOL) tablet 650 mg, 650 mg, Oral, Q4H PRN **OR** acetaminophen (TYLENOL) suppository 650 mg, 650 mg, Rectal, Q4H PRN, Meyran, Ocie Cornfield, NP .  docusate sodium (COLACE) capsule 100 mg, 100 mg, Oral, BID, Meyran, Ocie Cornfield, NP, 100 mg at 07/07/17 1008 .  HYDROcodone-acetaminophen (NORCO/VICODIN) 5-325 MG per tablet 1 tablet, 1 tablet, Oral, Q4H PRN, Meyran, Ocie Cornfield, NP, 1 tablet at 07/07/17 [phone] .  labetalol (NORMODYNE,TRANDATE) injection 10-40 mg, 10-40 mg, Intravenous, Q10 min PRN, Meyran, Ocie Cornfield, NP, 10 mg at 07/03/17 1120 .  lactated ringers infusion, , Intravenous, Continuous,  Meyran, Ocie Cornfield, NP, Last Rate: 10 mL/hr at 07/03/17 0729 .  morphine 2 MG/ML injection 1-2 mg, 1-2 mg, Intravenous, Q2H PRN, Meyran, Ocie Cornfield, NP .  ondansetron (ZOFRAN) tablet 4 mg, 4 mg, Oral, Q4H PRN **OR** ondansetron (ZOFRAN) injection 4 mg, 4 mg, Intravenous, Q4H PRN, Meyran, Ocie Cornfield, NP .  promethazine (PHENERGAN) tablet 12.5-25 mg, 12.5-25 mg, Oral, Q4H PRN, Meyran, Ocie Cornfield, NP  Patients Current Diet:  Diet Order           Diet - low sodium heart healthy        Diet regular Room service appropriate? Yes; Fluid consistency: Thin  Diet effective now          Precautions / Restrictions Precautions Precautions: Fall Restrictions Weight Bearing Restrictions: No   Has the patient had 2 or more falls or a fall with injury in the past year?Yes  Prior Activity Level Limited Community (1-2x/wk): will get out with husband who drives  [place] / [place] Devices/Equipment: Eyeglasses, Environmental consultant (specify type), Wheelchair, Dentures (specify type)(partials upper and lower) Home Equipment: Walker - 4 wheels, Walker - 2 wheels, Shower seat - built in  Prior Device Use: Indicate devices/aids used by the patient prior to current illness, exacerbation or injury? at home, recenlty been using Rollator  Prior Functional Level Prior Function Level of Independence: Needs assistance Gait / Transfers Assistance Needed: ambulates mod I with 4PY ADL's / Homemaking Assistance Needed: husband assists with transfer in/out of shower and provides set up  Self Care: Did the patient need help bathing, dressing, using the toilet or eating?  Independent, but did need assist for transfer into and out of shower with Set up A.   Indoor Mobility: Did the patient need assistance with walking from room to room (with or without device)? Independent  Stairs: Did the patient need assistance with internal or external stairs (with or without device)?  Needed some help  Functional Cognition: Did the patient need help planning regular tasks such as shopping or remembering to take medications? Independent  Current Functional Level Cognition  Overall Cognitive Status: Impaired/Different from baseline Orientation Level: Oriented X4 Following Commands: Follows one step commands consistently, Follows multi-step commands inconsistently, Follows multi-step commands with increased time Safety/Judgement: Decreased awareness of deficits, Decreased awareness of safety    Extremity Assessment (includes Sensation/Coordination)  Upper Extremity Assessment: LUE deficits/detail LUE Deficits / Details: Pt c/o fine motor coordination deficits since fall ~1 year ago; has difficulty signing name LUE Coordination: decreased fine motor  Lower Extremity Assessment: Defer to PT evaluation  ADLs  Overall ADL's : Needs assistance/impaired Eating/Feeding: Set up, Sitting Grooming: Minimal assistance, Sitting Upper Body Bathing: Min guard, Sitting Lower Body Bathing: Minimal assistance, Sit to/from stand Upper Body Dressing : Min guard, Sitting Lower Body Dressing: Minimal assistance, Sit to/from stand Toilet Transfer: Minimal assistance, Ambulation, RW Toilet Transfer Details (indicate cue type and reason): Simulated by sit to stand with functional mobility  Functional mobility during ADLs: Minimal assistance, Rolling walker General ADL Comments: Pt reports fatigue with mobility; requires frequent rest breaks    Mobility  Overal bed mobility: Needs Assistance Bed Mobility: Supine to Sit Supine to sit: Min guard, HOB elevated General bed mobility comments: +rail, increased time and effort    Transfers  Overall transfer level: Needs assistance Equipment used: Rolling walker (2 wheeled) Transfers: Sit to/from Stand Sit to Stand: Min assist Stand pivot transfers: Min assist General transfer comment: cues for hand placement, increased time to stabilize  initial standing balance    Ambulation / Gait / Stairs / Wheelchair Mobility  Ambulation/Gait Ambulation/Gait assistance: Herbalist (Feet): 70 Feet Assistive device: Rolling walker (2 wheeled) Gait Pattern/deviations: Step-through pattern, Decreased stride length, Wide base of support General Gait Details: decreased knee flexion bilat with wide BOS. Assist with RW management and cues to stay on task. Multi standing rest breaks due to fatigue. Pt with c/o dizziness. Gait velocity: decreased Gait velocity interpretation: <1.31 ft/sec, indicative of household ambulator    Posture / Balance Balance Overall balance assessment: Needs assistance Sitting-balance support: No upper extremity supported, Feet supported Sitting balance-Leahy Scale: Good Standing balance support: Bilateral upper extremity supported, During functional activity Standing balance-Leahy Scale: Poor Standing balance comment: reliant on RW and external support    Special needs/care consideration BiPAP/CPAP: No CPM: No Continuous Drip IV: 0.9% NaCl with KCL 20 mEq/L infusion Dialysis: No        Days: NA Life Vest: No Oxygen: No Special Bed: No Trach Size: No Wound Vac (area): No      Location: NA Skin: Surgical incision along R scalp and mid abdominal region                    Bowel mgmt:No BM since admission  Bladder mgmt:continent  Diabetic mgmt: No     Previous Home Environment Living Arrangements: Spouse/significant other Available Help at Discharge: Family, Available 24 hours/day Type of Home: House Home Layout: Two level, Able to live on main level with bedroom/bathroom Home Access: Stairs to enter Entrance Stairs-Rails: None Entrance Stairs-Number of Steps: 3 Bathroom Shower/Tub: Multimedia programmer: Standard Home Care Services: No  Discharge Living Setting Plans for Discharge Living Setting: Patient's home, Lives with (comment)(lives with husband) Type of Home at  Discharge: House Discharge Home Layout: One level Discharge Home Access: Stairs to enter Entrance Stairs-Rails: Right Entrance Stairs-Number of Steps: 3 Discharge Bathroom Shower/Tub: Tub/shower unit Discharge Bathroom Toilet: Standard Discharge Bathroom Accessibility: No Does the patient have any problems obtaining your medications?: No  Social/Family/Support Systems Patient Roles: Spouse Contact Information: pt listed son as Emergency Contact [phone] Anticipated Caregiver: husband; however pt has Mod I goals Anticipated Caregiver's Contact Information: husband-Larry: [phone]; son Elta [place]) [phone] Ability/Limitations of Caregiver: husband is unable to physically assist due to own instability Caregiver Availability: Intermittent(supervision intermittantly) Discharge Plan Discussed with Primary Caregiver: Yes Is Caregiver In Agreement with Plan?: Yes Does Caregiver/Family have Issues with Lodging/Transportation while Pt is in Rehab?: No   Goals/Additional Needs Patient/Family Goal for Rehab: PT/OT: Mod I; SLP: NA Expected  length of stay: 7 dyas Cultural Considerations: [employer] Dietary Needs: Pt requests gluten free Equipment Needs: TBD Special Service Needs: NA Additional Information: NA Pt/Family Agrees to Admission and willing to participate: Yes Program Orientation Provided & Reviewed with Pt/Caregiver Including Roles  & Responsibilities: Yes(reviewed with pt and son) Additional Information Needs: NA Information Needs to be Provided By: nA  Barriers to Discharge: Home environment access/layout, Lack of/limited family support  Barriers to Discharge Comments: Per son and pt, husband unable to provide any phsycial assist due to own comorbidities    Decrease burden of Care through IP rehab admission: NA   Possible need for SNF placement upon discharge: Not anticipated    Patient Condition: This patient's medical and functional status has changed since the  consult dated: 07/05/17 in which the Rehabilitation Physician determined and documented that the patient's condition is appropriate for intensive rehabilitative care in an inpatient rehabilitation facility. See "History of Present Illness" (above) for medical update. Functional changes are: Min A for 70 feet . Patient's medical and functional status update has been discussed with the Rehabilitation physician and patient remains appropriate for inpatient rehabilitation. Will admit to inpatient rehab today.  Preadmission Screen Completed By:  Jhonnie Garner, 07/07/2017 12:00 PM ______________________________________________________________________   Discussed status with Dr. Letta Pate on 07/07/17 at 12:00PM and received telephone approval for admission today.  Admission Coordinator:  Jhonnie Garner, time 12:00PM/Date 07/07/17

## 2017-07-06 NOTE — Progress Notes (Signed)
Subjective: Patient reports no headache, some unsteadiness to gait  Objective: Vital signs in last 24 hours: Temp:  [97.7 F (36.5 C)-98.6 F (37 C)] 98 F (36.7 C) (06/21 0732) Pulse Rate:  [69-86] 74 (06/21 0837) Resp:  [16-19] 18 (06/20 1500) BP: (124-163)/(63-76) 143/76 (06/21 0732) SpO2:  [93 %-97 %] 97 % (06/21 0837)  Intake/Output from previous day: 06/20 0701 - 06/21 0700 In: 400 [P.O.:400] Out: 250 [Urine:250] Intake/Output this shift: Total I/O In: 240 [P.O.:240] Out: -   Neurologic: Grossly normal  Lab Results: Lab Results  Component Value Date   WBC 6.1 06/27/2017   HGB 11.4 (L) 06/27/2017   HCT 35.0 (L) 06/27/2017   MCV 94.1 06/27/2017   PLT 257 06/27/2017   Lab Results  Component Value Date   INR 0.8 06/29/2011   BMET Lab Results  Component Value Date   NA 140 06/27/2017   K 3.8 06/27/2017   CL 106 06/27/2017   CO2 24 06/27/2017   GLUCOSE 90 06/27/2017   BUN 13 06/27/2017   CREATININE 0.77 06/27/2017   CALCIUM 9.4 06/27/2017    Studies/Results: No results found.  Assessment/Plan: Stable, await CIR vs snf  Estimated body mass index is 21.06 kg/m as calculated from the following:   Height as of this encounter: 5' 5"  (1.651 m).   Weight as of this encounter: 57.4 kg (126 lb 8.7 oz).    LOS: 3 days    Ahni Bradwell S 07/06/2017, 10:59 AM

## 2017-07-06 NOTE — Care Management Note (Signed)
Case Management Note  Patient Details  Name: Krista Evans MRN: [id number] Date of Birth: 08-16-1945  Subjective/Objective:  The patient is a 72 year old white female who has complained of gait instability, memory changes and incontinence.  Pt s/p elective VP shunt insertion on 07/03/17.  PTA, pt needs assistance with ADLS; lives with spouse who is able to provide assistance.                  Action/Plan: PT/OT recommending CIR, and consult in progress.  Insurance authorization pending for possible CIR admission pending medical stability.   Expected Discharge Date:                  Expected Discharge Plan:  IP Rehab Facility  In-House Referral:  Clinical Social Work  Discharge planning Services  CM Consult  Post Acute Care Choice:    Choice offered to:     DME Arranged:    DME Agency:     HH Arranged:    [place] Agency:     Status of Service:  In process, will continue to follow  If discussed at Long Length of Stay Meetings, dates discussed:    Additional Comments:  Reinaldo Raddle, RN, BSN  Trauma/Neuro ICU Case Manager [phone] se Oren Section, RN, BSN

## 2017-07-06 NOTE — Progress Notes (Signed)
Inpatient Rehabilitation-Admissions Coordinator    Met with patient at the bedside to discuss team's recommendation for inpatient rehabilitation. Shared booklets, expectations while in CIR, expected length of stay, and anticipated functional level at DC. Pt wanting CIR at this time. Spoke with pt's son over the phone [facility] noted RN note from 6/20) with pt's son updated on current rehab process. AC has began process for insurance auth; [facility] anticipates auth decision Saturday. Plan to follow for timing of medical readiness, insurance authorization, and IP Rehab bed availability. Call if questions.   Jhonnie Garner, OTR/L  Rehab Admissions Coordinator  [phone] 07/06/2017 11:29 AM

## 2017-07-07 ENCOUNTER — Inpatient Hospital Stay (HOSPITAL_COMMUNITY)
Admission: RE | Admit: 2017-07-07 | Discharge: 2017-07-14 | DRG: 092 | Disposition: A | Discharge status: Home Health | Payer: PPO | Source: Intra-hospital | Attending: Physical Medicine & Rehabilitation | Admitting: Physical Medicine & Rehabilitation

## 2017-07-07 ENCOUNTER — Other Ambulatory Visit: Payer: Self-pay

## 2017-07-07 DIAGNOSIS — E785 Hyperlipidemia, unspecified: Secondary | ICD-10-CM | POA: Diagnosis present

## 2017-07-07 DIAGNOSIS — Z7982 Long term (current) use of aspirin: Secondary | ICD-10-CM | POA: Diagnosis not present

## 2017-07-07 DIAGNOSIS — K5901 Slow transit constipation: Secondary | ICD-10-CM

## 2017-07-07 DIAGNOSIS — R0989 Other specified symptoms and signs involving the circulatory and respiratory systems: Secondary | ICD-10-CM | POA: Diagnosis not present

## 2017-07-07 DIAGNOSIS — R4189 Other symptoms and signs involving cognitive functions and awareness: Secondary | ICD-10-CM | POA: Diagnosis not present

## 2017-07-07 DIAGNOSIS — Z87891 Personal history of nicotine dependence: Secondary | ICD-10-CM | POA: Diagnosis not present

## 2017-07-07 DIAGNOSIS — K900 Celiac disease: Secondary | ICD-10-CM | POA: Diagnosis present

## 2017-07-07 DIAGNOSIS — Z825 Family history of asthma and other chronic lower respiratory diseases: Secondary | ICD-10-CM

## 2017-07-07 DIAGNOSIS — R531 Weakness: Secondary | ICD-10-CM | POA: Diagnosis present

## 2017-07-07 DIAGNOSIS — Z85118 Personal history of other malignant neoplasm of bronchus and lung: Secondary | ICD-10-CM | POA: Diagnosis not present

## 2017-07-07 DIAGNOSIS — D62 Acute posthemorrhagic anemia: Secondary | ICD-10-CM

## 2017-07-07 DIAGNOSIS — M810 Age-related osteoporosis without current pathological fracture: Secondary | ICD-10-CM | POA: Diagnosis not present

## 2017-07-07 DIAGNOSIS — Z8249 Family history of ischemic heart disease and other diseases of the circulatory system: Secondary | ICD-10-CM

## 2017-07-07 DIAGNOSIS — Z902 Acquired absence of lung [part of]: Secondary | ICD-10-CM

## 2017-07-07 DIAGNOSIS — R2689 Other abnormalities of gait and mobility: Principal | ICD-10-CM | POA: Diagnosis present

## 2017-07-07 DIAGNOSIS — G910 Communicating hydrocephalus: Secondary | ICD-10-CM | POA: Diagnosis not present

## 2017-07-07 DIAGNOSIS — R269 Unspecified abnormalities of gait and mobility: Secondary | ICD-10-CM

## 2017-07-07 DIAGNOSIS — Z982 Presence of cerebrospinal fluid drainage device: Secondary | ICD-10-CM

## 2017-07-07 DIAGNOSIS — G441 Vascular headache, not elsewhere classified: Secondary | ICD-10-CM

## 2017-07-07 MED ORDER — ONDANSETRON HCL 4 MG PO TABS: 4.0000 mg | ORAL_TABLET | ORAL | Status: DC | PRN

## 2017-07-07 MED ORDER — DOCUSATE SODIUM 100 MG PO CAPS
100.0000 mg | ORAL_CAPSULE | Freq: Two times a day (BID) | ORAL | Status: DC
  Administered 2017-07-07 – 2017-07-12 (×8): 100 mg via ORAL
  Filled 2017-07-07 (×13): qty 1

## 2017-07-07 MED ORDER — TRAZODONE HCL 50 MG PO TABS: 25.0000 mg | ORAL_TABLET | Freq: Every evening | ORAL | Status: DC | PRN

## 2017-07-07 MED ORDER — FLEET ENEMA 7-19 GM/118ML RE ENEM: 1.0000 | ENEMA | Freq: Once | RECTAL | Status: DC | PRN

## 2017-07-07 MED ORDER — POLYETHYLENE GLYCOL 3350 17 G PO PACK
17.0000 g | PACK | Freq: Every day | ORAL | Status: DC | PRN
  Administered 2017-07-07: 17 g via ORAL
  Filled 2017-07-07: qty 1

## 2017-07-07 MED ORDER — GUAIFENESIN-DM 100-10 MG/5ML PO SYRP: 5.0000 mL | ORAL_SOLUTION | Freq: Four times a day (QID) | ORAL | Status: DC | PRN

## 2017-07-07 MED ORDER — BISACODYL 10 MG RE SUPP: 10.0000 mg | Freq: Every day | RECTAL | Status: DC | PRN

## 2017-07-07 MED ORDER — DIPHENHYDRAMINE HCL 12.5 MG/5ML PO ELIX: 12.5000 mg | ORAL_SOLUTION | Freq: Four times a day (QID) | ORAL | Status: DC | PRN

## 2017-07-07 MED ORDER — HYDROCODONE-ACETAMINOPHEN 5-325 MG PO TABS
1.0000 | ORAL_TABLET | Freq: Four times a day (QID) | ORAL | Status: DC | PRN
  Administered 2017-07-07 – 2017-07-12 (×5): 1 via ORAL
  Filled 2017-07-07 (×5): qty 1

## 2017-07-07 MED ORDER — ONDANSETRON HCL 4 MG/2ML IJ SOLN: 4.0000 mg | INTRAMUSCULAR | Status: DC | PRN

## 2017-07-07 MED ORDER — ALUM & MAG HYDROXIDE-SIMETH 200-200-20 MG/5ML PO SUSP: 30.0000 mL | ORAL | Status: DC | PRN

## 2017-07-07 MED ORDER — ACETAMINOPHEN 325 MG PO TABS
325.0000 mg | ORAL_TABLET | ORAL | Status: DC | PRN
  Administered 2017-07-08 – 2017-07-12 (×9): 650 mg via ORAL
  Administered 2017-07-13: 325 mg via ORAL
  Administered 2017-07-13 – 2017-07-14 (×2): 650 mg via ORAL
  Filled 2017-07-07 (×13): qty 2

## 2017-07-07 NOTE — H&P (Signed)
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  She was admitted 07/03/2017 for placement of VP shunt by Dr. Arnoldo Morale.  Therapy evaluations done revealing weakness with balance deficits as well as cognitive deficits affecting ADLs and mobility.  CIR was recommended for follow-up therapy.     Review of Systems  Constitutional: Negative for chills and fever.  HENT: Negative for hearing loss and tinnitus.   Eyes: Negative for blurred vision and double vision.  Respiratory: Positive for shortness of breath (chronic). Negative for cough.   Cardiovascular: Negative for chest pain and palpitations.  Gastrointestinal: Negative for constipation, heartburn and nausea.  Musculoskeletal: Positive for back pain. Negative for joint pain.       LLE cramps with activity  Skin: Negative for rash.  Neurological: Positive for dizziness and headaches.  Psychiatric/Behavioral: The patient has insomnia.           Past Medical History:  Diagnosis Date  . Abnormal Q waves on electrocardiogram    . Anemia    . Aortic atherosclerosis ([place])      "I could possibly have it, my grandmother had it"  . Cancer [employer]) 05/2011    Right upper Lung CA with partial Lobectomy.  . Celiac disease    . Celiac disease    . Dyspnea      with exertion  . Hyperlipidemia    . Hyperlipidemia    . Lung mass    . Meningioma ([place])    . Osteoporosis             Past Surgical History:  Procedure Laterality Date  . LUNG LOBECTOMY        right lung  . VENTRICULOPERITONEAL SHUNT Right 07/03/2017    Procedure: SHUNT INSERTION VENTRICULAR-PERITONEAL;  Surgeon: Newman Pies, MD;  Location: [facility];  Service: Neurosurgery;  Laterality: Right;           Family History  Problem Relation Age of Onset  .  Breast cancer Cousin 94  . CAD Sister    . Asthma Mother    . Heart disease Mother    . Hypertension Mother    . COPD Father    . Diabetes Maternal Grandmother    . Diabetes Maternal Grandfather        Social History: Married. Retired Corporate treasurer. Independent with walker for the past 6 months. She  reports that she quit smoking about 31 years ago. Her smoking use included cigarettes. She has never used smokeless tobacco. She reports that she does not drink alcohol or use drugs.           Allergies  Allergen Reactions  . Barley Grass Other (See Comments)      And rye Doesn't absorb in small intestines  . Gluten Meal Other (See Comments)      Cant absorb in small intestines   . Wheat Bran Other (See Comments)      Doesn't absorb in small intestines            Medications Prior to Admission  Medication Sig Dispense Refill  . aspirin EC 81 MG tablet Take 81 mg daily by mouth.      . calcium carbonate (TUMS - DOSED IN MG ELEMENTAL CALCIUM) 500 MG chewable  tablet Chew 2 tablets by mouth daily.       . Cholecalciferol (VITAMIN D) 2000 units tablet Take 2,000 Units by mouth daily.      . Multiple Vitamin (MULTI-VITAMINS) TABS Take 1 tablet by mouth daily.           Drug Regimen Review  Drug regimen was reviewed and remains appropriate with no significant issues identified   Home: Home Living Family/patient expects to be discharged to:: Private residence Living Arrangements: Spouse/significant other Available Help at Discharge: Family, Available 24 hours/day Type of Home: House Home Access: Stairs to enter [employer] of Steps: 3 Entrance Stairs-Rails: None Home Layout: Two level, Able to live on main level with bedroom/bathroom Bathroom Shower/Tub: Multimedia programmer: Standard Home Equipment: Environmental consultant - 4 wheels, Environmental consultant - 2 wheels, Shower seat - built in   Functional History: Prior Function Level of Independence: Needs assistance Gait / Transfers Assistance  Needed: ambulates mod I with 0HK ADL's / Homemaking Assistance Needed: husband assists with transfer in/out of shower and provides set up   Functional Status:  Mobility: Bed Mobility Overal bed mobility: Needs Assistance Bed Mobility: Supine to Sit Supine to sit: Min guard, HOB elevated General bed mobility comments: +rail, increased time and effort Transfers Overall transfer level: Needs assistance Equipment used: Rolling walker (2 wheeled) Transfers: Sit to/from Stand Sit to Stand: Min assist Stand pivot transfers: Min assist General transfer comment: cues for hand placement, increased time to stabilize initial standing balance Ambulation/Gait Ambulation/Gait assistance: Min assist Gait Distance (Feet): 70 Feet Assistive device: Rolling walker (2 wheeled) Gait Pattern/deviations: Step-through pattern, Decreased stride length, Wide base of support General Gait Details: decreased knee flexion bilat with wide BOS. Assist with RW management and cues to stay on task. Multi standing rest breaks due to fatigue. Pt with c/o dizziness. Gait velocity: decreased Gait velocity interpretation: <1.31 ft/sec, indicative of household ambulator   ADL: ADL Overall ADL's : Needs assistance/impaired Eating/Feeding: Set up, Sitting Grooming: Minimal assistance, Sitting Upper Body Bathing: Min guard, Sitting Lower Body Bathing: Minimal assistance, Sit to/from stand Upper Body Dressing : Min guard, Sitting Lower Body Dressing: Minimal assistance, Sit to/from stand Toilet Transfer: Minimal assistance, Ambulation, RW Toilet Transfer Details (indicate cue type and reason): Simulated by sit to stand with functional mobility  Functional mobility during ADLs: Minimal assistance, Rolling walker General ADL Comments: Pt reports fatigue with mobility; requires frequent rest breaks   Cognition: Cognition Overall Cognitive Status: Impaired/Different from baseline Orientation Level: Oriented  X4 Cognition Arousal/Alertness: Awake/alert Behavior During Therapy: WFL for tasks assessed/performed Overall Cognitive Status: Impaired/Different from baseline Area of Impairment: Safety/judgement, Problem solving, Memory, Following commands Memory: Decreased short-term memory Following Commands: Follows one step commands consistently, Follows multi-step commands inconsistently, Follows multi-step commands with increased time Safety/Judgement: Decreased awareness of deficits, Decreased awareness of safety Problem Solving: Slow processing, Difficulty sequencing, Requires verbal cues   Physical Exam: Blood pressure (!) 143/76, pulse 74, temperature 98 F (36.7 C), temperature source Oral, resp. rate 18, height 5' 5"  (1.651 m), weight 57.4 kg (126 lb 8.7 oz), SpO2 97 %. Physical Exam  Nursing note and vitals reviewed. Musculoskeletal:  Healing abrasions left shin.     General: No acute distress Mood and affect are appropriate Heart: Regular rate and rhythm no rubs murmurs or extra sounds Lungs: Clear to auscultation, breathing unlabored, no rales or wheezes Abdomen: Positive bowel sounds, soft nontender to palpation, nondistended Extremities: No clubbing, cyanosis, or edema Skin: Right occipital and RUQ Abd incision  healing well Neurologic: Cranial nerves II through XII intact, motor strength is 5/5 in bilateral deltoid, bicep, tricep, grip, hip flexor, knee extensors, ankle dorsiflexor and plantar flexor Sensory exam normal sensation to light touch and proprioception in bilateral upper and lower extremities Cerebellar exam normal finger to nose to finger as well as heel to shin in bilateral upper and lower extremities Musculoskeletal: Full range of motion in all 4 extremities. No joint swelling   Lab Results Last 48 Hours  No results found for this or any previous visit (from the past 48 hour(s)).   Imaging Results (Last 48 hours)  No results found.           Medical Problem  List and Plan: 1.  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  DVT Prophylaxis/Anticoagulation: Mechanical: Sequential compression devices, below knee Bilateral lower extremities 3. Pain Management: Continue Norco prn for HA/pain 4. Mood: LCSW to follow for evaluation and support.  5. Neuropsych: This patient is capable of making decisions on her own behalf. 6. Skin/Wound Care: routine pressure relief measures.  7. Fluids/Electrolytes/Nutrition: monitor I/O. Check lytes in am.  8.  Constipation: May need bowel regimen augmented    Post Admission Physician Evaluation: 1. Functional deficits secondary  to Communicating Hydrocephalus. 2. Patient admitted to receive collaborative, interdisciplinary care between the physiatrist, rehab nursing staff, and therapy team. 3. Patient's level of medical complexity and substantial therapy needs in context of that medical necessity cannot be provided at a lesser intensity of care. 4. Patient has experienced substantial functional loss from his/her baseline. Judging by the patient's diagnosis, physical exam, and functional history, the patient has potential for functional progress which will result in measurable gains while on inpatient rehab.  These gains will be of substantial and practical use upon discharge in facilitating mobility and self-care at the household level. 5. Physiatrist will provide 24 hour management of medical needs as well as oversight of the therapy plan/treatment and provide guidance as appropriate regarding the interaction of the two. 6. 24 hour rehab nursing will assist in the management of  bladder management, bowel management, safety, skin/wound care, disease management, medication administration, pain management and patient education  and help integrate therapy concepts, techniques,education, etc. 7. PT will assess and treat for pre gait, gait training, endurance , safety, equipment, neuromuscular re  education:  .  Goals are: independent with assistive device. 8. OT will assess and treat for ADLs, Cognitive perceptual skills, Neuromuscular re education, safety, endurance, equipment  .  Goals are: independent with increased time.  9. SLP will assess and treat for  .  Goals are: N/A. 10. Case Management and Social Worker will assess and treat for psychological issues and discharge planning. 11. Team conference will be held weekly to assess progress toward goals and to determine barriers to discharge. 12.  Patient will receive at least 3 hours of therapy per day at least 5 days per week. 13. ELOS and Prognosis: 7d good     "I have personally performed a face to face diagnostic evaluation of this patient.  Additionally, I have reviewed and concur with the physician assistant's documentation above."  Charlett Blake M.D. [facility] FAAPM&R (Sports Med, Neuromuscular Med) Diplomate [place] Board of [place], PA-C 07/06/2017

## 2017-07-07 NOTE — Discharge Summary (Signed)
Physician Discharge Summary  Patient ID: Krista Evans MRN: [id number] DOB/AGE: Oct 24, 1945 72 y.o.  Admit date: 07/03/2017 Discharge date: 07/07/2017  Admission Diagnoses: hydrocephalus    Discharge Diagnoses: same   Discharged Condition: stable  Hospital Course: The patient was admitted on 07/03/2017 and taken to the operating room where the patient underwent VP shunt placement. The patient tolerated the procedure well and was taken to the recovery room and then to the ICU in stable condition. The hospital course was routine. There were no complications. The wound remained clean dry and intact. Pt had appropriate head soreness. No complaints of  new N/T/W. The patient remained afebrile with stable vital signs, and tolerated a regular diet. The patient continued to increase activities, and pain was well controlled with oral pain medications. She was discharged to inpatient rehabilitation  Consults: rehabilitation medicine  Significant Diagnostic Studies:  Results for orders placed or performed during the hospital encounter of 07/03/17  MRSA PCR Screening  Result Value Ref Range   MRSA by PCR NEGATIVE NEGATIVE    Ct Head Wo Contrast  Result Date: 07/04/2017 CLINICAL DATA:  72 y/o  F; follow-up post shunt. EXAM: CT HEAD WITHOUT CONTRAST TECHNIQUE: Contiguous axial images were obtained from the base of the skull through the vertex without intravenous contrast. COMPARISON:  03/17/2017 MRI of the head. FINDINGS: Brain: Interval placement of a right parietal approach ventriculostomy catheter with tip in the frontal horn of right lateral ventricle abutting the septum pellucidum. Stable ventricle size. Trace volume of pneumocephalus overlying right frontal lobe. No acute hemorrhage, stroke, or mass effect. Stable chronic microvascular ischemic changes and parenchymal volume loss of the brain. Stable right frontal convexity meningioma without mass effect. Vascular: Calcific atherosclerosis of the  carotid siphons. Skull: Right parietal burr hole postsurgical changes and postsurgical changes in the scalp along the course of the ventriculostomy catheter. Sinuses/Orbits: No acute finding. Other: None. IMPRESSION: 1. Postsurgical changes related to right parietal burr hole and parietal approach ventriculostomy catheter. Trace pneumocephalus overlying right frontal lobe. 2. No acute abnormality identified. 3. Stable ventricle size, chronic microvascular ischemic changes, and parenchymal volume loss of the brain. Electronically Signed   By: Kristine Garbe M.D.   On: 07/04/2017 05:23    Antibiotics:  Anti-infectives (From admission, onward)   Start     Dose/Rate Route Frequency Ordered Stop   07/03/17 1700  ceFAZolin (ANCEF) IVPB 1 g/50 mL premix     1 g 100 mL/hr over 30 Minutes Intravenous Every 8 hours 07/03/17 1146 07/04/17 0138   07/03/17 0947  bacitracin 50,000 Units in sodium chloride 0.9 % 500 mL irrigation  Status:  Discontinued       As needed 07/03/17 0948 07/03/17 1031   07/03/17 0815  ceFAZolin (ANCEF) IVPB 2g/100 mL premix     2 g 200 mL/hr over 30 Minutes Intravenous On call to O.R. 07/03/17 0706 07/03/17 0034      Discharge Exam: Blood pressure (!) 111/56, pulse 67, temperature 97.9 F (36.6 C), temperature source Oral, resp. rate 16, height 5' 5"  (1.651 m), weight 57.4 kg (126 lb 8.7 oz), SpO2 95 %. Neurologic: Grossly normal Incisions clean dry and intact  Discharge Medications:   Allergies as of 07/07/2017      Reactions   Barley Grass Other (See Comments)   And rye Doesn't absorb in small intestines   Gluten Meal Other (See Comments)   Cant absorb in small intestines    Wheat Bran Other (See Comments)   Doesn't absorb  in small intestines      Medication List    TAKE these medications   aspirin EC 81 MG tablet Take 81 mg daily by mouth.   calcium carbonate 500 MG chewable tablet Commonly known as:  TUMS - dosed in mg elemental calcium Chew 2  tablets by mouth daily.   MULTI-VITAMINS Tabs Take 1 tablet by mouth daily.   Vitamin D 2000 units tablet Take 2,000 Units by mouth daily.            Discharge Care Instructions  (From admission, onward)        Start     Ordered   07/07/17 0000  Discharge wound care:    Comments:  Staple removal in 7 days   07/07/17 0756      Disposition: CIR   Final Dx:VP shunt  Discharge Instructions    Call MD for:  difficulty breathing, headache or visual disturbances   Complete by:  As directed    Call MD for:  persistant nausea and vomiting   Complete by:  As directed    Call MD for:  redness, tenderness, or signs of infection (pain, swelling, redness, odor or green/yellow discharge around incision site)   Complete by:  As directed    Call MD for:  severe uncontrolled pain   Complete by:  As directed    Call MD for:  temperature >100.4   Complete by:  As directed    Diet - low sodium heart healthy   Complete by:  As directed    Discharge wound care:   Complete by:  As directed    Staple removal in 7 days   Increase activity slowly   Complete by:  As directed       Follow-up Information    Newman Pies, MD. Schedule an appointment as soon as possible for a visit in 2 week(s).   Specialty:  Neurosurgery Contact information: [address] [place] [address] [phone]            Signed: Eustace Moore 07/07/2017, 7:56 AM

## 2017-07-07 NOTE — Progress Notes (Signed)
Spoke w [facility] for CIR, ins Josem Kaufmann has been obtained and plan is to move patient to CIR today.

## 2017-07-07 NOTE — Progress Notes (Signed)
Inpatient Rehabilitation-Admissions Coordinator   CIR noted medical approval from Neurosurgery for DC to CIR per Discharge Summary and Order placed (dated 07/07/17). AC just received insurance approval for admit to CIR today. AC has bed available and will admit pt to CIR today. Floor RN and CM notified of plan. Please call if questions.   Jhonnie Garner, OTR/L  Rehab Admissions Coordinator  [phone] 07/07/2017 12:35 PM

## 2017-07-07 NOTE — Progress Notes (Signed)
Physical Medicine and Rehabilitation Consult   Reason for Consult: NPH with gait disorder Referring Physician: Dr. Arnoldo Morale   HPI: Krista Evans is a 72 y.o. female with history of lung cancer, pelvic fracture with left foot drop, celiac disease, who has had worsening of gait disorder with bladder incontinence and memory difficulties due to NPH. She was admitted on 07/03/17 for placement of VPS by Dr. Arnoldo Morale. Therapy evaluations revealed weakness with balance deficits and cognitive deficits affecting ADLs and mobility. CIR recommended for follow up therapy.   Patient states her pelvic fracture was greater than 1 year ago.  Her lung cancer was back in 2013.  Her worsening of gait has been over the last 2 months or so.  She does feel like her ambulation distance today is greater than it has been for a couple months.  Her husband can assist to some degree at home however the patient states that he would likely not be of much help.  Review of Systems  Constitutional: Negative for chills and fever.  HENT: Positive for tinnitus (chronic in right ear). Negative for hearing loss.   Eyes: Negative for blurred vision and double vision.  Respiratory: Negative for cough and shortness of breath.   Cardiovascular: Negative for chest pain, palpitations and leg swelling.  Gastrointestinal: Negative for heartburn and nausea.  Genitourinary: Negative for frequency and urgency.  Musculoskeletal: Positive for back pain.  Skin: Negative for itching.  Neurological: Positive for sensory change (LLE) and headaches (since surgery).  Psychiatric/Behavioral: Negative for memory loss.          Past Medical History:  Diagnosis Date  . Abnormal Q waves on electrocardiogram   . Anemia   . Aortic atherosclerosis ([place])    "I could possibly have it, my grandmother had it"  . Cancer [facility]) 05/2011   Right upper Lung CA with partial Lobectomy.  . Celiac disease   . Celiac disease   . Dyspnea     with exertion  . Hyperlipidemia   . Hyperlipidemia   . Lung mass   . Meningioma ([place])   . Osteoporosis          Past Surgical History:  Procedure Laterality Date  . LUNG LOBECTOMY     right lung  . VENTRICULOPERITONEAL SHUNT Right 07/03/2017   Procedure: SHUNT INSERTION VENTRICULAR-PERITONEAL;  Surgeon: Newman Pies, MD;  Location: [facility];  Service: Neurosurgery;  Laterality: Right;         Family History  Problem Relation Age of Onset  . Breast cancer Cousin 71  . CAD Sister   . Asthma Mother   . Heart disease Mother   . Hypertension Mother   . COPD Father   . Diabetes Maternal Grandmother   . Diabetes Maternal Grandfather     Social History:   Married. Independent with AD PTA. Retired Corporate treasurer.  Husband supportive and has been managing home for a few months. She reports that she quit smoking about 31 years ago. Her smoking use included cigarettes. She has never used smokeless tobacco. She reports that she does not drink alcohol or use drugs.         Allergies  Allergen Reactions  . Barley Grass Other (See Comments)    And rye Doesn't absorb in small intestines  . Gluten Meal Other (See Comments)    Cant absorb in small intestines   . Wheat Bran Other (See Comments)    Doesn't absorb in small intestines  Medications Prior to Admission  Medication Sig Dispense Refill  . aspirin EC 81 MG tablet Take 81 mg daily by mouth.    . calcium carbonate (TUMS - DOSED IN MG ELEMENTAL CALCIUM) 500 MG chewable tablet Chew 2 tablets by mouth daily.     . Cholecalciferol (VITAMIN D) 2000 units tablet Take 2,000 Units by mouth daily.    . Multiple Vitamin (MULTI-VITAMINS) TABS Take 1 tablet by mouth daily.       Home: Home Living Family/patient expects to be discharged to:: Private residence Living Arrangements: Spouse/significant other Available Help at Discharge: Family, Available 24 hours/day Type of Home: House Home Access:  Stairs to enter [employer] of Steps: 3 Entrance Stairs-Rails: None Home Layout: Two level, Able to live on main level with bedroom/bathroom Bathroom Shower/Tub: Multimedia programmer: [place]: Environmental consultant - 4 wheels, Environmental consultant - 2 wheels, Shower seat - built in  Functional History: Prior Function Level of Independence: Needs assistance Gait / Transfers Assistance Needed: ambulates mod I with 2JJ ADL's / Homemaking Assistance Needed: husband assists with transfer in/out of shower and provides set up Functional Status:  Mobility: Bed Mobility General bed mobility comments: Pt OOB in chair upon arrival Transfers Overall transfer level: Needs assistance Equipment used: Rolling walker (2 wheeled) Transfers: Sit to/from Stand Sit to Stand: Min assist Stand pivot transfers: Min assist General transfer comment: verbal cues for hand placement. Assist to power up.  Ambulation/Gait Ambulation/Gait assistance: Min assist Gait Distance (Feet): 120 Feet Assistive device: Rolling walker (2 wheeled) Gait Pattern/deviations: Step-through pattern, Decreased stride length General Gait Details: stiff-leg gait with decreased knee flexion bilat. Pt with several episodes of bilat LE 'freezing' during ambulation requiring cues/assist to re-initiate mobility.  Gait velocity: decreased Gait velocity interpretation: <1.31 ft/sec, indicative of household ambulator  ADL: ADL Overall ADL's : Needs assistance/impaired Eating/Feeding: Set up, Sitting Grooming: Minimal assistance, Sitting Upper Body Bathing: Min guard, Sitting Lower Body Bathing: Minimal assistance, Sit to/from stand Upper Body Dressing : Min guard, Sitting Lower Body Dressing: Minimal assistance, Sit to/from stand Toilet Transfer: Minimal assistance, Ambulation, RW Toilet Transfer Details (indicate cue type and reason): Simulated by sit to stand with functional mobility  Functional mobility during ADLs:  Minimal assistance, Rolling walker General ADL Comments: Pt reports fatigue with mobility; requires frequent rest breaks  Cognition: Cognition Overall Cognitive Status: Impaired/Different from baseline Orientation Level: Oriented X4 Cognition Arousal/Alertness: Awake/alert Behavior During Therapy: Flat affect Overall Cognitive Status: Impaired/Different from baseline Area of Impairment: Safety/judgement, Problem solving, Memory, Following commands Memory: Decreased short-term memory Following Commands: Follows one step commands consistently, Follows multi-step commands inconsistently, Follows multi-step commands with increased time Safety/Judgement: Decreased awareness of deficits, Decreased awareness of safety Problem Solving: Slow processing, Difficulty sequencing, Requires verbal cues   Blood pressure (!) 156/65, pulse 77, temperature 97.9 F (36.6 C), temperature source Oral, resp. rate 17, height 5' 5"  (1.651 m), weight 57.4 kg (126 lb 8.7 oz), SpO2 96 %. Physical Exam  Nursing note and vitals reviewed. Constitutional: She is oriented to person, place, and time. She appears well-developed and well-nourished.  HENT:  Head: Normocephalic and atraumatic.  Eyes: Pupils are equal, round, and reactive to light. Conjunctivae are normal.  Neck: Normal range of motion.  Cardiovascular: Normal rate and regular rhythm.  No murmur heard. Respiratory: Effort normal and breath sounds normal.  GI: Soft. Bowel sounds are normal. She exhibits no distension.  Neurological: She is alert and oriented to person, place, and time.  Motor strength is  5/5 bilateral deltoid, bicep, tricep, grip 4/5 bilateral hip flexor knee extensor ankle dorsiflex. Sensation intact light touch bilateral upper and lower limb Patient has mild dysmetria left finger-nose-finger normal on the right side.   Skin: Skin is warm and dry.  Abdominal incision with dressing.  Psychiatric: She has a normal mood and affect.      LabResultsLast24Hours  No results found for this or any previous visit (from the past 24 hour(s)).    ImagingResults(Last48hours)  Ct Head Wo Contrast  Result Date: 07/04/2017 CLINICAL DATA:  72 y/o  F; follow-up post shunt. EXAM: CT HEAD WITHOUT CONTRAST TECHNIQUE: Contiguous axial images were obtained from the base of the skull through the vertex without intravenous contrast. COMPARISON:  03/17/2017 MRI of the head. FINDINGS: Brain: Interval placement of a right parietal approach ventriculostomy catheter with tip in the frontal horn of right lateral ventricle abutting the septum pellucidum. Stable ventricle size. Trace volume of pneumocephalus overlying right frontal lobe. No acute hemorrhage, stroke, or mass effect. Stable chronic microvascular ischemic changes and parenchymal volume loss of the brain. Stable right frontal convexity meningioma without mass effect. Vascular: Calcific atherosclerosis of the carotid siphons. Skull: Right parietal burr hole postsurgical changes and postsurgical changes in the scalp along the course of the ventriculostomy catheter. Sinuses/Orbits: No acute finding. Other: None. IMPRESSION: 1. Postsurgical changes related to right parietal burr hole and parietal approach ventriculostomy catheter. Trace pneumocephalus overlying right frontal lobe. 2. No acute abnormality identified. 3. Stable ventricle size, chronic microvascular ischemic changes, and parenchymal volume loss of the brain. Electronically Signed   By: Kristine Garbe M.D.   On: 07/04/2017 05:23      Assessment/Plan: Diagnosis: Normal pressure hydrocephalus with gait disorder. 1. Does the need for close, 24 hr/day medical supervision in concert with the patient's rehab needs make it unreasonable for this patient to be served in a less intensive setting? Potentially 2. Co-Morbidities requiring supervision/potential complications: History of osteoporosis 3. Due to safety,  skin/wound care, medication administration, pain management and patient education, does the patient require 24 hr/day rehab nursing? Potentially 4. Does the patient require coordinated care of a physician, rehab nurse, PT (1-2 hrs/day, 5 days/week) and OT (1-2 hrs/day, 5 days/week) to address physical and functional deficits in the context of the above medical diagnosis(es)? Potentially Addressing deficits in the following areas: balance, endurance, locomotion, strength, transferring, bowel/bladder control, bathing, dressing, feeding and toileting 5. Can the patient actively participate in an intensive therapy program of at least 3 hrs of therapy per day at least 5 days per week? Yes 6. The potential for patient to make measurable gains while on inpatient rehab is good 7. Anticipated functional outcomes upon discharge from inpatient rehab are modified independent  with PT, modified independent with OT, n/a with SLP. 8. Estimated rehab length of stay to reach the above functional goals is: 7d 9. Anticipated D/C setting: Home 10. Anticipated post D/C treatments: [place] therapy 11. Overall Rehab/Functional Prognosis: excellent  RECOMMENDATIONS: This patient's condition is appropriate for continued rehabilitative care in the following setting: If patient remains a current functional levels, would recommend short CIR stay if patient progresses to supervision can go home with husband supervision Patient has agreed to participate in recommended program. Yes Note that insurance prior authorization may be required for reimbursement for recommended care.  Comment:   "I have personally performed a face to face diagnostic evaluation of this patient.  Additionally, I have reviewed and concur with the physician assistant's documentation above." Luanna Salk.  Kirsteins M.D. [facility] FAAPM&R (Sports Med, Neuromuscular Med) Diplomate Am Board of [place],  PA-C 07/05/2017          Revision History                        Routing History

## 2017-07-07 NOTE — Progress Notes (Signed)
Patient discharged to inpatient rehab instable condition with all belongings. She verbalized understanding of all discharge instructions.

## 2017-07-07 NOTE — Progress Notes (Signed)
Patient ID: Krista Evans, female   DOB: [birth year], 72 y.o.   MRN: [id number] Admit to unit, oriented to unit, plan of care and rehab routine. States an understanding of information provided. Margarito Liner

## 2017-07-07 NOTE — Progress Notes (Signed)
PMR Admission Coordinator Pre-Admission Assessment  Patient: Krista Evans is an 72 y.o., female MRN: [id number] DOB: Jun 04, 1945 Height: 5' 5"  (165.1 cm) Weight: 57.4 kg (126 lb 8.7 oz)                                                                                                                                                  Insurance Information HMO:     PPO: Yes     PCP:      IPA:      80/20:      OTHER:  PRIMARY: [employer] (plan type: HTA Plan 1 PPO)      Policy#: [id number]      Subscriber: Patient  CM Name: Krista Evans      Phone#: [phone]     Fax#: [phone] Per Tommie Raymond (who gave auth on 07/07/17) updates due on day 7 (07/08/27) Pre-Cert#: [id number]      Employer:  Benefits:  Phone #: [phone]     Name: HTA portal (Online) Eff. Date: 01/16/17     Deduct: $0      Out of Pocket Max: $3,400      Life Max: NA CIR: $295/day for days 1-6; $0/day for days 7-90      SNF: $20/day for days 1-20; $160/day for days 21-100 (100 day limit) Outpatient: per necessity     Co-Pay: $15/visit Home Health: 100% per necessity     Co-Pay:  DME: 80%     Co-Pay: 20% Providers:   Medicaid Application Date:       Case Manager:  Disability Application Date:       Case Worker:   Emergency Contact Information         Contact Information    Name Relation Home Work Mobile   Krista Evans Spouse Krista Evans Son [phone]       Current Medical History  Patient Admitting Diagnosis: Normal pressure hydrocephalus with gait disorder History of Present Illness: Krista Gamarra Mahaffeyis a 72 y.o.femalewith history of lung cancer, pelvic fracture with left foot drop, celiac disease, who has had worsening of gait disorder with bladder incontinence and memory difficulties due to NPH. She was admitted on 07/03/17 for placement of VPS by Dr. Arnoldo Morale. Therapy evaluations revealed weakness with balance deficits and cognitive deficits affecting ADLs and mobility.  CIR recommended for follow up therapy.  Patient states her pelvic fracture was greater than 1 year ago. Her lung cancer was back in 2013. Her worsening of gait has been over the last 2 months or so. She does feel like her ambulation distance today is greater than it has been for a couple months. Her husband can assist to some degree at home however the patient states that he would likely not be of much help.   Past Medical History  Past Medical History:  Diagnosis Date  . Abnormal Q waves on electrocardiogram   . Anemia   . Aortic atherosclerosis ([place])    "I could possibly have it, my grandmother had it"  . Cancer [facility]) 05/2011   Right upper Lung CA with partial Lobectomy.  . Celiac disease   . Celiac disease   . Dyspnea    with exertion  . Hyperlipidemia   . Hyperlipidemia   . Lung mass   . Meningioma ([place])   . Osteoporosis     Family History  family history includes Asthma in her mother; Breast cancer (age of onset: 37) in her cousin; CAD in her sister; COPD in her father; Diabetes in her maternal grandfather and maternal grandmother; Heart disease in her mother; Hypertension in her mother.  Prior Rehab/Hospitalizations:  Has the patient had major surgery during 100 days prior to admission? No  Current Medications   Current Facility-Administered Medications:  .  0.9 % NaCl with KCl 20 mEq/ L  infusion, , Intravenous, Continuous, Meyran, Ocie Cornfield, NP, Stopped at 07/04/17 2238 .  acetaminophen (TYLENOL) tablet 650 mg, 650 mg, Oral, Q4H PRN **OR** acetaminophen (TYLENOL) suppository 650 mg, 650 mg, Rectal, Q4H PRN, Meyran, Ocie Cornfield, NP .  docusate sodium (COLACE) capsule 100 mg, 100 mg, Oral, BID, Meyran, Ocie Cornfield, NP, 100 mg at 07/07/17 1008 .  HYDROcodone-acetaminophen (NORCO/VICODIN) 5-325 MG per tablet 1 tablet, 1 tablet, Oral, Q4H PRN, Meyran, Ocie Cornfield, NP, 1 tablet at 07/07/17 [phone] .  labetalol (NORMODYNE,TRANDATE)  injection 10-40 mg, 10-40 mg, Intravenous, Q10 min PRN, Meyran, Ocie Cornfield, NP, 10 mg at 07/03/17 1120 .  lactated ringers infusion, , Intravenous, Continuous, Meyran, Ocie Cornfield, NP, Last Rate: 10 mL/hr at 07/03/17 0729 .  morphine 2 MG/ML injection 1-2 mg, 1-2 mg, Intravenous, Q2H PRN, Meyran, Ocie Cornfield, NP .  ondansetron (ZOFRAN) tablet 4 mg, 4 mg, Oral, Q4H PRN **OR** ondansetron (ZOFRAN) injection 4 mg, 4 mg, Intravenous, Q4H PRN, Meyran, Ocie Cornfield, NP .  promethazine (PHENERGAN) tablet 12.5-25 mg, 12.5-25 mg, Oral, Q4H PRN, Meyran, Ocie Cornfield, NP  Patients Current Diet:       Diet Order           Diet - low sodium heart healthy        Diet regular Room service appropriate? Yes; Fluid consistency: Thin  Diet effective now          Precautions / Restrictions Precautions Precautions: Fall Restrictions Weight Bearing Restrictions: No   Has the patient had 2 or more falls or a fall with injury in the past year?Yes  Prior Activity Level Limited Community (1-2x/wk): will get out with husband who drives  [place] / [place] Devices/Equipment: Eyeglasses, Environmental consultant (specify type), Wheelchair, Dentures (specify type)(partials upper and lower) Home Equipment: Walker - 4 wheels, Walker - 2 wheels, Shower seat - built in  Prior Device Use: Indicate devices/aids used by the patient prior to current illness, exacerbation or injury? at home, recenlty been using Rollator  Prior Functional Level Prior Function Level of Independence: Needs assistance Gait / Transfers Assistance Needed: ambulates mod I with 5TI ADL's / Homemaking Assistance Needed: husband assists with transfer in/out of shower and provides set up  Self Care: Did the patient need help bathing, dressing, using the toilet or eating?  Independent, but did need assist for transfer into and out of shower with Set up A.   Indoor Mobility: Did the patient  need assistance with walking from room to room (with  or without device)? Independent  Stairs: Did the patient need assistance with internal or external stairs (with or without device)? Needed some help  Functional Cognition: Did the patient need help planning regular tasks such as shopping or remembering to take medications? Independent  Current Functional Level Cognition  Overall Cognitive Status: Impaired/Different from baseline Orientation Level: Oriented X4 Following Commands: Follows one step commands consistently, Follows multi-step commands inconsistently, Follows multi-step commands with increased time Safety/Judgement: Decreased awareness of deficits, Decreased awareness of safety    Extremity Assessment (includes Sensation/Coordination)  Upper Extremity Assessment: LUE deficits/detail LUE Deficits / Details: Pt c/o fine motor coordination deficits since fall ~1 year ago; has difficulty signing name LUE Coordination: decreased fine motor  Lower Extremity Assessment: Defer to PT evaluation    ADLs  Overall ADL's : Needs assistance/impaired Eating/Feeding: Set up, Sitting Grooming: Minimal assistance, Sitting Upper Body Bathing: Min guard, Sitting Lower Body Bathing: Minimal assistance, Sit to/from stand Upper Body Dressing : Min guard, Sitting Lower Body Dressing: Minimal assistance, Sit to/from stand Toilet Transfer: Minimal assistance, Ambulation, RW Toilet Transfer Details (indicate cue type and reason): Simulated by sit to stand with functional mobility  Functional mobility during ADLs: Minimal assistance, Rolling walker General ADL Comments: Pt reports fatigue with mobility; requires frequent rest breaks    Mobility  Overal bed mobility: Needs Assistance Bed Mobility: Supine to Sit Supine to sit: Min guard, HOB elevated General bed mobility comments: +rail, increased time and effort    Transfers  Overall transfer level: Needs assistance Equipment used:  Rolling walker (2 wheeled) Transfers: Sit to/from Stand Sit to Stand: Min assist Stand pivot transfers: Min assist General transfer comment: cues for hand placement, increased time to stabilize initial standing balance    Ambulation / Gait / Stairs / Wheelchair Mobility  Ambulation/Gait Ambulation/Gait assistance: Herbalist (Feet): 70 Feet Assistive device: Rolling walker (2 wheeled) Gait Pattern/deviations: Step-through pattern, Decreased stride length, Wide base of support General Gait Details: decreased knee flexion bilat with wide BOS. Assist with RW management and cues to stay on task. Multi standing rest breaks due to fatigue. Pt with c/o dizziness. Gait velocity: decreased Gait velocity interpretation: <1.31 ft/sec, indicative of household ambulator    Posture / Balance Balance Overall balance assessment: Needs assistance Sitting-balance support: No upper extremity supported, Feet supported Sitting balance-Leahy Scale: Good Standing balance support: Bilateral upper extremity supported, During functional activity Standing balance-Leahy Scale: Poor Standing balance comment: reliant on RW and external support    Special needs/care consideration BiPAP/CPAP: No CPM: No Continuous Drip IV: 0.9% NaCl with KCL 20 mEq/L infusion Dialysis: No        Days: NA Life Vest: No Oxygen: No Special Bed: No Trach Size: No Wound Vac (area): No      Location: NA Skin: Surgical incision along R scalp and mid abdominal region                    Bowel mgmt:No BM since admission  Bladder mgmt:continent  Diabetic mgmt: No     Previous Home Environment Living Arrangements: Spouse/significant other Available Help at Discharge: Family, Available 24 hours/day Type of Home: House Home Layout: Two level, Able to live on main level with bedroom/bathroom Home Access: Stairs to enter Entrance Stairs-Rails: None Entrance Stairs-Number of Steps: 3 Bathroom Shower/Tub: Clinical cytogeneticist: Standard Home Care Services: No  Discharge Living Setting Plans for Discharge Living Setting: Patient's home, Lives with (comment)(lives with husband) Type of Home at Discharge:  House Discharge Home Layout: One level Discharge Home Access: Stairs to enter Entrance Stairs-Rails: Right Entrance Stairs-Number of Steps: 3 Discharge Bathroom Shower/Tub: Tub/shower unit Discharge Bathroom Toilet: Standard Discharge Bathroom Accessibility: No Does the patient have any problems obtaining your medications?: No  Social/Family/Support Systems Patient Roles: Spouse Contact Information: pt listed son as Emergency Contact [phone] Anticipated Caregiver: husband; however pt has Mod I goals Anticipated Caregiver's Contact Information: husband-Larry: [phone]; son Elta [place]) [phone] Ability/Limitations of Caregiver: husband is unable to physically assist due to own instability Caregiver Availability: Intermittent(supervision intermittantly) Discharge Plan Discussed with Primary Caregiver: Yes Is Caregiver In Agreement with Plan?: Yes Does Caregiver/Family have Issues with Lodging/Transportation while Pt is in Rehab?: No   Goals/Additional Needs Patient/Family Goal for Rehab: PT/OT: Mod I; SLP: NA Expected length of stay: 7 dyas Cultural Considerations: [employer] Dietary Needs: Pt requests gluten free Equipment Needs: TBD Special Service Needs: NA Additional Information: NA Pt/Family Agrees to Admission and willing to participate: Yes Program Orientation Provided & Reviewed with Pt/Caregiver Including Roles  & Responsibilities: Yes(reviewed with pt and son) Additional Information Needs: NA Information Needs to be Provided By: nA  Barriers to Discharge: Home environment access/layout, Lack of/limited family support  Barriers to Discharge Comments: Per son and pt, husband unable to provide any phsycial assist due to own comorbidities    Decrease burden  of Care through IP rehab admission: NA   Possible need for SNF placement upon discharge: Not anticipated    Patient Condition: This patient's medical and functional status has changed since the consult dated: 07/05/17 in which the Rehabilitation Physician determined and documented that the patient's condition is appropriate for intensive rehabilitative care in an inpatient rehabilitation facility. See "History of Present Illness" (above) for medical update. Functional changes are: Min A for 70 feet . Patient's medical and functional status update has been discussed with the Rehabilitation physician and patient remains appropriate for inpatient rehabilitation. Will admit to inpatient rehab today.  Preadmission Screen Completed By:  Jhonnie Garner, 07/07/2017 12:00 PM ______________________________________________________________________   Discussed status with Dr. Letta Pate on 07/07/17 at 12:00PM and received telephone approval for admission today.  Admission Coordinator:  Jhonnie Garner, time 12:00PM/Date 07/07/17             Cosigned by: Charlett Blake, MD at 07/07/2017 12:23 PM  Revision History

## 2017-07-08 ENCOUNTER — Inpatient Hospital Stay (HOSPITAL_COMMUNITY): Payer: PPO | Admitting: Occupational Therapy

## 2017-07-08 ENCOUNTER — Inpatient Hospital Stay (HOSPITAL_COMMUNITY): Payer: PPO

## 2017-07-08 ENCOUNTER — Inpatient Hospital Stay (HOSPITAL_COMMUNITY): Payer: PPO | Admitting: Physical Therapy

## 2017-07-08 ENCOUNTER — Other Ambulatory Visit: Payer: Self-pay

## 2017-07-08 ENCOUNTER — Encounter (HOSPITAL_COMMUNITY): Payer: Self-pay | Admitting: *Deleted

## 2017-07-08 LAB — CBC WITH DIFFERENTIAL/PLATELET
ABS IMMATURE GRANULOCYTES: 0 10*3/uL (ref 0.0–0.1)
BASOS ABS: 0.1 10*3/uL (ref 0.0–0.1)
BASOS PCT: 1 %
Eosinophils Absolute: 0.2 10*3/uL (ref 0.0–0.7)
Eosinophils Relative: 3 %
HCT: 36.3 % (ref 36.0–46.0)
HEMOGLOBIN: 11.7 g/dL — AB (ref 12.0–15.0)
IMMATURE GRANULOCYTES: 0 %
Lymphocytes Relative: 27 %
Lymphs Abs: 1.9 10*3/uL (ref 0.7–4.0)
MCH: 30.8 pg (ref 26.0–34.0)
MCHC: 32.2 g/dL (ref 30.0–36.0)
MCV: 95.5 fL (ref 78.0–100.0)
MONO ABS: 0.7 10*3/uL (ref 0.1–1.0)
Monocytes Relative: 10 %
NEUTROS ABS: 4.2 10*3/uL (ref 1.7–7.7)
NEUTROS PCT: 59 %
PLATELETS: 280 10*3/uL (ref 150–400)
RBC: 3.8 MIL/uL — AB (ref 3.87–5.11)
RDW: 13.6 % (ref 11.5–15.5)
WBC: 7.1 10*3/uL (ref 4.0–10.5)

## 2017-07-08 LAB — COMPREHENSIVE METABOLIC PANEL
ALBUMIN: 3.5 g/dL (ref 3.5–5.0)
ALT: 21 U/L (ref 14–54)
AST: 27 U/L (ref 15–41)
Alkaline Phosphatase: 64 U/L (ref 38–126)
Anion gap: 8 (ref 5–15)
BUN: 22 mg/dL — AB (ref 6–20)
CHLORIDE: 102 mmol/L (ref 101–111)
CO2: 26 mmol/L (ref 22–32)
Calcium: 9.1 mg/dL (ref 8.9–10.3)
Creatinine, Ser: 0.87 mg/dL (ref 0.44–1.00)
GFR calc Af Amer: >60 mL/min (ref >60)
GFR calc non Af Amer: >60 mL/min (ref >60)
GLUCOSE: 97 mg/dL (ref 65–99)
POTASSIUM: 4.1 mmol/L (ref 3.5–5.1)
SODIUM: 136 mmol/L (ref 135–145)
Total Bilirubin: 0.6 mg/dL (ref 0.3–1.2)
Total Protein: 7.5 g/dL (ref 6.5–8.1)

## 2017-07-08 MED ORDER — POLYETHYLENE GLYCOL 3350 17 G PO PACK
17.0000 g | PACK | Freq: Two times a day (BID) | ORAL | Status: DC
  Administered 2017-07-08 – 2017-07-10 (×5): 17 g via ORAL
  Filled 2017-07-08 (×10): qty 1

## 2017-07-08 NOTE — Plan of Care (Signed)
LTGs established 07/08/17

## 2017-07-08 NOTE — Evaluation (Addendum)
Occupational Therapy Assessment and Plan  Patient Details  Name: Krista Evans MRN: [id number] Date of Birth: Feb 07, 1945  OT Diagnosis: abnormal posture and muscle weakness (generalized) Rehab Potential: Rehab Potential (ACUTE ONLY): Excellent ELOS: 7-10 days   Today's Date: 07/08/2017 OT Individual Time: 1301-1430 OT Individual Time Calculation (min): 89 min     Problem List:  Patient Active Problem List   Diagnosis Date Noted  . 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 Hydrocephalus 07/03/2017  . 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 Pelvic fracture ([place]) 04/18/2016  . CD (celiac disease) 08/04/2014  . Cancer of upper lobe of right lung ([place]) 08/04/2014  . Age related osteoporosis 11/26/2013  . Absolute anemia 05/21/2013  . H/O malignant neoplasm 05/21/2013  . HLD (hyperlipidemia) 05/21/2013    Past Medical History:  Past Medical History:  Diagnosis Date  . Abnormal Q waves on electrocardiogram   . Anemia   . Aortic atherosclerosis ([place] )    "I could possibly have it, my grandmother had it"  . Cancer [facility]) 05/2011   Right upper Lung CA with partial Lobectomy.  . Celiac disease   . Celiac disease   . Dyspnea    with exertion  . Hyperlipidemia   . Hyperlipidemia   . Lung mass   . Meningioma ([place])   . Osteoporosis    Past Surgical History:  Past Surgical History:  Procedure Laterality Date  . LUNG LOBECTOMY     right lung  . 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 She was admitted 07/03/2017 for placement of VP shunt by Dr. Arnoldo Morale. Therapy evaluations done revealing weakness with  balance deficits as well as cognitive deficits affecting ADLs and mobility. CIR was recommended for follow-up therapy.  Patient currently requires min with basic self-care skills secondary to muscle weakness, decreased cardiorespiratoy endurance and decreased standing balance.  Prior to hospitalization, patient could complete BADLs with supervision.  Patient will benefit from skilled intervention to increase independence with basic self-care skills prior to discharge home with care partner.  Anticipate patient will require 24 hour supervision and follow up home health.  OT Assessment Rehab Potential (ACUTE ONLY): Excellent OT Barriers to Discharge: (N/A) OT Patient demonstrates impairments in the following area(s): Balance;Safety;Sensory;Endurance;Motor OT Basic ADL's Functional Problem(s): Grooming;Bathing;Dressing;Toileting OT Advanced ADL's Functional Problem(s): Simple Meal Preparation OT Transfers Functional Problem(s): Toilet;Tub/Shower OT Additional Impairment(s): None OT Plan OT Intensity: Minimum of 1-2 x/day, 45 to 90 minutes OT Frequency: 5 out of 7 days OT Duration/Estimated Length of Stay: 7-10 days OT Treatment/Interventions: Balance/vestibular training;Discharge planning;Functional electrical stimulation;Pain management;Self Care/advanced ADL retraining;Therapeutic Activities;UE/LE Coordination activities;Visual/perceptual remediation/compensation;Skin care/wound managment;Therapeutic Exercise;Patient/family education;Functional mobility training;Disease mangement/prevention;Cognitive remediation/compensation;Community reintegration;DME/adaptive equipment instruction;Neuromuscular re-education;Psychosocial support;Splinting/orthotics;UE/LE Strength taining/ROM;Wheelchair propulsion/positioning OT Self Feeding Anticipated Outcome(s): No goal OT Basic Self-Care Anticipated Outcome(s): Supervision/cuing  OT Toileting Anticipated Outcome(s): Supervision/cuing  OT Bathroom Transfers  Anticipated Outcome(s): Supervision/cuing  OT Recommendation Recommendations for Other Services: Therapeutic Recreation consult;Speech consult Therapeutic Recreation Interventions: Outing/community reintergration;Pet therapy Patient destination: Home Follow Up Recommendations: Home health OT Equipment Recommended: 3 in 1 bedside comode;Tub/shower bench Equipment Details: TTB pending on if spouse agrees to it.   Skilled Therapeutic Intervention Pt greeted in recliner with  no c/o pain. Tx focus on initial evaluation, education on OT role/POC, and establishment of patient-centered goals. Pt completed bathing, dressing, toileting, and grooming tasks with steady assist using RW at ambulatory level. Staple sites were covered in shower. Pt required vcs for walker mgt/safety throughout. Initiated planning for DME needs with pt/spouse. Pt left with all needs within reach and chair alarm set. Spouse present.   OT Evaluation Precautions/Restrictions  Precautions Precautions: Fall Restrictions Weight Bearing Restrictions: No General Chart Reviewed: Yes Family/Caregiver Present: Carmelia Roller) Vital Signs Therapy Vitals Temp: 98 F (36.7 C) Temp Source: Oral Pulse Rate: 73 Resp: 20 BP: (!) 141/78 Oxygen Therapy SpO2: 98 % O2 Device: Room Air Pain Pain Assessment Pain Scale: 0-10 Pain Score: 0-No pain Pain Type: Surgical pain;Acute pain Pain Location: Head Pain Orientation: Right Pain Descriptors / Indicators: Aching Pain Frequency: Intermittent Pain Onset: On-going Patients Stated Pain Goal: 2 Pain Intervention(s): Medication (See eMAR) Home Living/Prior Functioning Home Living Family/patient expects to be discharged to:: Private residence Living Arrangements: Spouse/significant other Available Help at Discharge: Family, Available 24 hours/day Type of Home: House Home Access: Stairs to enter Technical brewer of Steps: 3 Entrance Stairs-Rails: Right Home Layout: Two  level, Able to live on main level with bedroom/bathroom Bathroom Shower/Tub: Gaffer, Chiropodist: Standard Bathroom Accessibility: No Additional Comments: Has 2 bathrooms. Neither is walker accessible and pt "furniture walks" using countertops  Lives With: Spouse IADL History Homemaking Responsibilities: No(Spouse takes care of most IADLs) Occupation: Retired Type of Occupation: LPN Leisure and Hobbies: Taking care of her 3 small dogs  Prior Function Level of Independence: Requires assistive device for independence, Needs assistance with ADLs(Spouse provided supervision/Min A for ADL tasks)  Able to Take Stairs?: Yes Driving: No ADL ADL ADL Comments: Please see functional navigator for ADL status Vision Baseline Vision/History: Wears glasses Wears Glasses: At all times Patient Visual Report: No change from baseline Vision Assessment?: No apparent visual deficits Additional Comments: Pt able to scan for small grooming items in toiletry purse and on sink and search Rt>Lt for clothing items.  Perception  Perception: Within Functional Limits Praxis Praxis: Intact Cognition Overall Cognitive Status: Within Functional Limits for tasks assessed Arousal/Alertness: Awake/alert Orientation Level: Person;Situation;Place Person: Oriented Place: Oriented Situation: Oriented Year: 2019 Month: June Day of Week: Correct Memory: Appears intact Immediate Memory Recall: Sock;Blue;Bed Memory Recall: Sock;Blue;Bed Memory Recall Sock: Without Cue Memory Recall Blue: Without Cue Memory Recall Bed: Without Cue Attention: Sustained Sustained Attention: Appears intact Awareness: Impaired Awareness Impairment: Emergent impairment;Anticipatory impairment Problem Solving: Impaired Safety/Judgment: Appears intact Sensation Sensation Hot/Cold: Appears Intact Coordination Gross Motor Movements are Fluid and Coordinated: No Fine Motor Movements are Fluid and  Coordinated: Yes Motor  Motor Motor: Hemiplegia Motor - Skilled Clinical Observations: mild Lt hemiplegia Trunk/Postural Assessment  Cervical Assessment Cervical Assessment: (Forward head) Thoracic Assessment Thoracic Assessment: Exceptions to WFL(Rounded shoulders) Lumbar Assessment Lumbar Assessment: Exceptions to WFL(Posterior pelvic tilt) Postural Control Postural Control: Deficits on evaluation  Balance Balance Balance Assessed: Yes Dynamic Sitting Balance Dynamic Sitting - Level of Assistance: 4: Min assist(doffing pants while sitting on toilet) Dynamic Standing Balance Dynamic Standing - Level of Assistance: 4: Min assist(completing pericare while standing in shower) Extremity/Trunk Assessment RUE Assessment RUE Assessment: Within Functional Limits(4-/5 MMT proximal to distal) LUE Assessment LUE Assessment: Within Functional Limits(3+/5 MMT proximal to distal)   See Function Navigator for Current Functional Status.   Refer to Care Plan for Long Term Goals  Recommendations for other services: Therapeutic Recreation  Pet  therapy and Outing/community reintegration   Discharge Criteria: Patient will be discharged from OT if patient refuses treatment 3 consecutive times without medical reason, if treatment goals not met, if there is a change in medical status, if patient makes no progress towards goals or if patient is discharged from hospital.  The above assessment, treatment plan, treatment alternatives and goals were discussed and mutually agreed upon: by patient and by family  Skeet Simmer 07/08/2017, 4:33 PM

## 2017-07-08 NOTE — Evaluation (Signed)
Physical Therapy Assessment and Plan  Patient Details  Name: Krista Evans MRN: [id number] Date of Birth: Jan 15, 1946  PT Diagnosis: Abnormality of gait, Difficulty walking, Impaired sensation and Muscle weakness Rehab Potential: Good ELOS: 7-10 days   Today's Date: 07/08/2017 PT Individual Time: 0815-0925 PT Individual Time Calculation (min): 70 min    Problem List:  Patient Active Problem List   Diagnosis Date Noted  . 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 Hydrocephalus 07/03/2017  . 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 Pelvic fracture ([place]) 04/18/2016  . CD (celiac disease) 08/04/2014  . Cancer of upper lobe of right lung ([place]) 08/04/2014  . Age related osteoporosis 11/26/2013  . Absolute anemia 05/21/2013  . H/O malignant neoplasm 05/21/2013  . HLD (hyperlipidemia) 05/21/2013    Past Medical History:  Past Medical History:  Diagnosis Date  . Abnormal Q waves on electrocardiogram   . Anemia   . Aortic atherosclerosis ([place])    "I could possibly have it, my grandmother had it"  . Cancer [facility]) 05/2011   Right upper Lung CA with partial Lobectomy.  . Celiac disease   . Celiac disease   . Dyspnea    with exertion  . Hyperlipidemia   . Hyperlipidemia   . Lung mass   . Meningioma ([place])   . Osteoporosis    Past Surgical History:  Past Surgical History:  Procedure Laterality Date  . LUNG LOBECTOMY     right lung  . 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 year old female with recent admission to the hospital on 07/03/2017 for placement of VP shunt.  Patient transferred to CIR on 07/07/2017 .   Patient currently requires min with mobility secondary to muscle weakness and decreased standing balance, decreased postural control and decreased balance  strategies.  Prior to hospitalization, patient was modified independent  with mobility and lived with Spouse in a House home.  Home access is 3Stairs to enter.  Patient will benefit from skilled PT intervention to maximize safe functional mobility, minimize fall risk and decrease caregiver burden for planned discharge home with 24 hour supervision.  Anticipate patient will benefit from follow up OP at discharge.  PT - End of Session Activity Tolerance: Tolerates 30+ min activity with multiple rests Endurance Deficit: Yes PT Assessment Rehab Potential (ACUTE/IP ONLY): Good PT Patient demonstrates impairments in the following area(s): Balance;Endurance;Motor;Pain;Safety;Sensory PT Transfers Functional Problem(s): Bed Mobility;Bed to Chair;Car;Furniture;Floor PT Locomotion Functional Problem(s): Ambulation;Wheelchair Mobility;Stairs PT Plan PT Intensity: Minimum of 1-2 x/day ,45 to 90 minutes PT Frequency: 5 out of 7 days PT Duration Estimated Length of Stay: 7-10 days PT Treatment/Interventions: Ambulation/gait training;Discharge planning;Therapeutic Activities;Functional mobility training;Balance/vestibular training;Neuromuscular re-education;Therapeutic Exercise;Wheelchair propulsion/positioning;UE/LE Strength taining/ROM;Splinting/orthotics;Pain management;DME/adaptive equipment instruction;Cognitive remediation/compensation;Community reintegration;Functional electrical stimulation;Patient/family education;Stair training;UE/LE Coordination activities PT Transfers Anticipated Outcome(s): supervision PT Locomotion Anticipated Outcome(s): supervision with RW PT Recommendation Recommendations for Other Services: Neuropsych consult Follow Up Recommendations: Outpatient PT;24 hour supervision/assistance Patient destination: Home Equipment Recommended: To be determined  Skilled Therapeutic Intervention Pt participated in skilled PT eval and was educated on PT POC and goals.  Pt performs stand  pivot transfers throughout session with min A.  Pt performs gait with RW with min A, wide BOS, small step and stride length, decreased Lt DF and knee flexion during gait.  Simulated car transfer with min A.  Gait with  RW up/down ramp with min A, cues for wt shift with descending ramp.  Standing balance with dressing with steadying assist.  nustep for UE/LE strengthening x 6 minutes level 4. Pt requires frequent rests due to fatigue, good motivation to improve. Pt left in w/c with alarm set, needs at hand.  PT Evaluation Precautions/Restrictions Precautions Precautions: Fall Restrictions Weight Bearing Restrictions: No Pain Pain Assessment Pain Scale: 0-10 Pain Score: 4  Faces Pain Scale: Hurts little more Pain Type: Surgical pain Pain Location: Head Pain Orientation: Right Pain Descriptors / Indicators: Aching Pain Frequency: Intermittent Pain Onset: On-going Patients Stated Pain Goal: 1 Pain Intervention(s): Repositioned;RN made aware Home Living/Prior Functioning Home Living Available Help at Discharge: Family;Available 24 hours/day Type of Home: House Home Access: Stairs to enter [employer] of Steps: 3 Entrance Stairs-Rails: Right Home Layout: Two level;Able to live on main level with bedroom/bathroom  Lives With: Spouse Prior Function Level of Independence: Independent with basic ADLs;Requires assistive device for independence  Able to Take Stairs?: Yes Driving: No Vocation: Retired Comments: pt states she used RW at home  Cognition Overall Cognitive Status: Impaired/Different from baseline Orientation Level: Oriented X4 Awareness: Impaired Awareness Impairment: Emergent impairment;Anticipatory impairment Problem Solving: Impaired Safety/Judgment: Impaired Sensation Sensation Light Touch: Impaired Detail Light Touch Impaired Details: Impaired LUE;Impaired LLE Proprioception: Impaired Detail Proprioception Impaired Details: Impaired  LUE Coordination Gross Motor Movements are Fluid and Coordinated: No Coordination and Movement Description: wide BOS during gait, requires verbal and visual cues for motor planning and sequencing Motor  Motor Motor: Hemiplegia Motor - Skilled Clinical Observations: mild Lt hemiplegia  Mobility Bed Mobility Bed Mobility: Supine to Sit Supine to Sit: Supervision/Verbal cueing Transfers Transfers: Stand Pivot Transfers Stand Pivot Transfers: Minimal Assistance - Patient > 75% Transfer (Assistive device): None Locomotion  Gait Ambulation: Yes Gait Assistance: Minimal Assistance - Patient > 75% Gait Distance (Feet): 50 Feet Assistive device: Rolling walker Stairs / Additional Locomotion Stairs: Yes Stairs Assistance: Minimal Assistance - Patient > 75% Stair Management Technique: One rail Right Number of Stairs: 4 Ramp: Minimal Assistance - Patient >75% Wheelchair Mobility Wheelchair Mobility: No  Trunk/Postural Assessment  Cervical Assessment Cervical Assessment: (fwd head) Thoracic Assessment Thoracic Assessment: (rounded shoulders) Lumbar Assessment Lumbar Assessment: (posterior pelvic tilt) Postural Control Postural Control: Deficits on evaluation Righting Reactions: delayed  Balance Balance Balance Assessed: Yes Dynamic Standing Balance Dynamic Standing - Comments: min A with RW Extremity Assessment      RLE Assessment General Strength Comments: grossly 3+/5 LLE Assessment General Strength Comments: grossly 3/5, pt does not use full Lt ankle ROM during gait   See Function Navigator for Current Functional Status.   Refer to Care Plan for Long Term Goals  Recommendations for other services: Neuropsych  Discharge Criteria: Patient will be discharged from PT if patient refuses treatment 3 consecutive times without medical reason, if treatment goals not met, if there is a change in medical status, if patient makes no progress towards goals or if patient is  discharged from hospital.  The above assessment, treatment plan, treatment alternatives and goals were discussed and mutually agreed upon: by patient  [facility] 07/08/2017, 9:29 AM

## 2017-07-08 NOTE — Progress Notes (Signed)
Occupational Therapy Session Note  Patient Details  Name: Krista Evans MRN: [id number] Date of Birth: Mar 03, 1945  [id number] 27 min  Short Term Goals: Week 1:  OT Short Term Goal 1 (Week 1): STGs=LTGs due to ELOS  Skilled Therapeutic Interventions/Progress Updates:    1:1. Pt and spouse present upon arrival. Pt with no c/o pian. Pt completes ambulatory toilet transfer with RW and CGA with VC for RW management and longer strides d/t wide shuffling gait. Pt with tendency to push walker too far out in front of her. Pt completes toileting with CGA for 3/3 components. Ot reviews TTB transfer and pt completes with min HHA. With time pt may be able to complete step over transfer, however pt reports liking TTB. Exited session with pt seated in recliner, chair alarm on and call light in reach  Therapy Documentation Precautions:  Precautions Precautions: Fall Restrictions Weight Bearing Restrictions: No General:    See Function Navigator for Current Functional Status.   Therapy/Group: Individual Therapy  Tonny Branch 07/08/2017, 3:29 PM

## 2017-07-08 NOTE — Progress Notes (Signed)
  Subjective/Complaints: Rigth sided neck pain, explained the track of the VP shunt Voiding well, good appetite  ROS: denies CP, SOB, NVD Objective: Vital Signs: Blood pressure (!) 144/65, pulse 71, temperature 97.8 F (36.6 C), temperature source Oral, resp. rate 18, height 5' 5"  (1.651 m), weight 56.4 kg (124 lb 5.4 oz), SpO2 95 %. No results found. No results found for this or any previous visit (from the past 72 hour(s)).   HEENT: Bruising RIght side of neck Cardio: RRR and No murmur Resp: CTA B/L and Unlabored GI: BS positive and NT, ND Extremity:  No Edema Skin:   Other RIght occipital and Right UQ abd incsion CDI Neuro: Alert/Oriented, Normal Sensory and Normal Motor Musc/Skel:  Other no pain with UE or LE ROM Gen NAD   Assessment/Plan: 1. 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 Physiatrist is providing close team supervision and 24 hour management of active medical problems listed below. Physiatrist and rehab team continue to assess barriers to discharge/monitor patient progress toward functional and medical goals. FIM:             Function - Chair/bed transfer Chair/bed transfer assist level: Touching or steadying assistance (Pt > 75%)  Function - Locomotion: Wheelchair Will patient use wheelchair at discharge?: No Function - Locomotion: Ambulation Assistive device: No device Max distance: 10 Assist level: Touching or steadying assistance (Pt > 75%) Assist level: Touching or steadying assistance (Pt > 75%) Assist level: Touching or steadying assistance (Pt > 75%)  Function - Comprehension Comprehension: Auditory Comprehension assist level: Understands basic 75 - 89% of the time/ requires cueing 10 - 24% of the time  Function - Expression Expression: Verbal Expression assist level: Expresses basic 90% of the time/requires cueing <  10% of the time.  Function - Social Interaction Social Interaction assist level: Interacts appropriately 75 - 89% of the time - Needs redirection for appropriate language or to initiate interaction.  Function - Problem Solving Problem solving assist level: Solves basic 90% of the time/requires cueing < 10% of the time  Function - Memory Memory assist level: Recognizes or recalls 75 - 89% of the time/requires cueing 10 - 24% of the time  Medical Problem List and Plan: 1. Gait disorder and decline in ADLs secondary to Communicating hydrocephalus PT, OT, SLP initial evals today 2. DVT Prophylaxis/Anticoagulation: Mechanical:Sequential compression devices, below kneeBilateral lower extremities 3. Pain Management:Continue Norco prn for HA/pain 4. Mood:LCSW to follow for evaluation and support. 5. Neuropsych: This patientiscapable of making decisions onherown behalf. 6. Skin/Wound Care:routine pressure relief measures, Wounds look good. 7. Fluids/Electrolytes/Nutrition:monitor I/O. Check lytes in am.  8. Constipation: May need bowel regimen augmented no BM since admission, cont colace BID, increase Miralax to BID, may need bisacodyl supp today   LOS (Days) 1 A FACE TO FACE EVALUATION WAS PERFORMED  Charlett Blake 07/08/2017, 9:22 AM

## 2017-07-09 ENCOUNTER — Inpatient Hospital Stay (HOSPITAL_COMMUNITY): Payer: PPO

## 2017-07-09 DIAGNOSIS — K5901 Slow transit constipation: Secondary | ICD-10-CM

## 2017-07-09 DIAGNOSIS — D62 Acute posthemorrhagic anemia: Secondary | ICD-10-CM

## 2017-07-09 DIAGNOSIS — G441 Vascular headache, not elsewhere classified: Secondary | ICD-10-CM

## 2017-07-09 NOTE — Progress Notes (Signed)
Physical Therapy Session Note  Patient Details  Name: RHODIA ACRES MRN: [id number] Date of Birth: [birth year]  Today's Date: 07/09/2017 PT Individual Time: 1000-1115 PT Individual Time Calculation (min): 75 min   Short Term Goals: Week 1:  PT Short Term Goal 1 (Week 1): = LTG  Skilled Therapeutic Interventions/Progress Updates:    Functional transfers with and without RW overall min assist with cues for hand placement, some facilitation for anterior weightshift (tendency for posterior bias) and assist for balance and positioning of RW. Pt reports using rollator PTA and states that sometimes it got too far ahead of her too fast. Discussed potentially recommending a RW instead for improved safety but would continue to assess while patient here prior to d/c. Pt reports she does have access to a RW at home if needed. Gait training with RW x 100' with min assist and noted wide BOS, decreased knee flexion (L > R), decreased trunk rotation and difficulty with fluidity of movement (stutter and shuffle throughout and difficulty with motor planning and coordination (L > R). Nustep x 5 min with BUE and BLE for reciprocal movement pattern retraining and then re-attempted gait with noted improvement of fluid movement x 50' and then trial without AD which pt requires min to light mod assist for facilitation of weightshift and cues for initiation of steps as pt tendency to get stuck. Stair negotiation training with R rail only (using BUE for support) in a manner of how she did it at home with overall min assist and cues for attention to foot placement (ataxia noted on LLE with placement) repeated 8 steps x 2 reps with seated rest break between trials. Administered TUG (see results below) using RW and average of 3 trials ( Trial 1: 1 min 23 sec; Trial 2: 1 min 9 sec; Trial 3: 1 min 25 sec)  Five times Sit to Stand Test (FTSS) Method: Use a straight back chair with a solid seat that is 16-18" high. Ask participant  to sit on the chair with arms folded across their chest.   Instructions: "Stand up and sit down as quickly as possible 5 times, keeping your arms folded across your chest."   Measurement: Stop timing when the participant stands the 5th time.  TIME: __66____ (in seconds)  Times > 13.6 seconds is associated with increased disability and morbidity (Guralnik, 2000) Times > 15 seconds is predictive of recurrent falls in healthy individuals aged 74 and older (Buatois, et al., 2008) Normal performance values in community dwelling individuals aged 74 and older (Bohannon, 2006): o 74-69 years: 11.4 seconds o 74-79 years: 12.6 seconds o 74-89 years: 14.8 seconds  MCID: ? 2.3 seconds for Vestibular Disorders (Meretta, 2006)   End of session returned back to recliner with steadying assist without AD and all needs in reach.    Therapy Documentation Precautions:  Precautions Precautions: Fall Restrictions Weight Bearing Restrictions: No    Pain: Pain Assessment Pain Scale: 0-10 Pain Score: 6  Pain Type: Acute pain;Surgical pain Pain Location: Head Pain Orientation: Right;Lateral Pain Descriptors / Indicators: Sharp Pain Onset: With Activity Pain Intervention(s): Medication (See eMAR)    Balance: Standardized Balance Assessment Standardized Balance Assessment: Timed Up and Go Test Timed Up and Go Test TUG: Normal TUG(with RW) Normal TUG (seconds): 79(avg 3 trials)  See Function Navigator for Current Functional Status.   Therapy/Group: Individual Therapy  Canary Brim Ivory Broad, PT, DPT  07/09/2017, 11:17 AM

## 2017-07-09 NOTE — Progress Notes (Signed)
Social Work  Social Work Assessment and Plan  Patient Details  Name: Krista Evans MRN: [id number] Date of Birth: [birth year]  Today's Date: 07/09/2017  Problem List:  Patient Active Problem List   Diagnosis Date Noted  . 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 Hydrocephalus 07/03/2017  . 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 Pelvic fracture ([place]) 04/18/2016  . CD (celiac disease) 08/04/2014  . Cancer of upper lobe of right lung ([place]) 08/04/2014  . Age related osteoporosis 11/26/2013  . Absolute anemia 05/21/2013  . H/O malignant neoplasm 05/21/2013  . HLD (hyperlipidemia) 05/21/2013   Past Medical History:  Past Medical History:  Diagnosis Date  . Abnormal Q waves on electrocardiogram   . Anemia   . Aortic atherosclerosis ([place])    "I could possibly have it, my grandmother had it"  . Cancer [facility]) 05/2011   Right upper Lung CA with partial Lobectomy.  . Celiac disease   . Celiac disease   . Dyspnea    with exertion  . Hyperlipidemia   . Hyperlipidemia   . Lung mass   . Meningioma ([place])   . Osteoporosis    Past Surgical History:  Past Surgical History:  Procedure Laterality Date  . LUNG LOBECTOMY     right lung  . 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 Her smoking use included cigarettes. She has never used smokeless tobacco. She reports that she does not drink alcohol or use drugs.  Family / Support Systems Marital Status: Married Patient Roles: Spouse, Parent Spouse/Significant Other: Fritz Pickerel [phone] Children: Mark-son [phone] Other Supports: Daughter in [employer] and church members Anticipated Caregiver: Husband  Ability/Limitations of Caregiver: Husband has some health issues can provide supervision level Caregiver  Availability: 24/7 Family Dynamics: Close knit with both of their children, who are involved and willing to assist. Husband does have his own health issues so needs her as high level as possible. She is so glad to be here to get lots of therapies.  Social History Preferred language: English Religion: Baptist Cultural Background: No issues Education: [employer] Read: Yes Write: Yes Employment Status: Retired Freight forwarder Issues: No issues Guardian/Conservator: None-according to MD pt is capable of making her own decisions while here. Will make sure husband is involved also due to pt still having some deficits from her VP shunt   Abuse/Neglect Abuse/Neglect Assessment Can Be Completed: Yes Physical Abuse: Denies Verbal Abuse: Denies Sexual Abuse: Denies Exploitation of patient/patient's resources: Denies Self-Neglect: Denies  Emotional Status Pt's affect, behavior adn adjustment status: Pt is motivated to be here and ready to do lots of therpaies. She wants to do all that she can for herself before she leave. Her balance and ambulation needs to get better, she hopes while here. Recent Psychosocial Issues: other health issues Pyschiatric History: No issues-deferred depression screen due to still adjusting to the program and feels it is going great. Will monitor and see if would need to be seen by neuro-psych while here Substance Abuse History: No issues  Patient / Family Perceptions, Expectations & Goals Pt/Family understanding of illness & functional limitations: Pt and husband can explain her gait issues and surgery she has had. Both are hopeful she will do well here and make progress. They both talk with the MD and feel they have a good understanding going  forward in her treatment plan. Premorbid pt/family roles/activities: Wife, mom, grandmother, retiree, church member, etc Anticipated changes in roles/activities/participation: resume Pt/family expectations/goals: Pt  states: " I want to be able to have better balance and be steady on my feet."  Husband states: " I hope she is safer walking at home."  [employer]: None Premorbid Home Care/DME Agencies: Other (Comment)(has DME from previous admits) Transportation available at discharge: Husband Resource referrals recommended: Support group (specify)  Discharge Planning Living Arrangements: Spouse/significant other Support Systems: Spouse/significant other, Children, Friends/neighbors, Social worker community Type of Residence: Private residence Insurance Resources: Multimedia programmer (specify)(Health Team Advantage) Financial Resources: Social Security Financial Screen Referred: No Living Expenses: Own Money Management: Spouse Does the patient have any problems obtaining your medications?: No Home Management: Husband does most of it due to pt's balance issues Patient/Family Preliminary Plans: Return home with husband who was assisting prior to admission and will still assist. Both hope she is mod/i and moving better on her feet before going home. Will work on discharge needs and encourage husband to be ehre to attend therapies with pt. Social Work Anticipated Follow Up Needs: HH/OP, Support Group  Clinical Impression Pleasant female who is grateful to be here on rehab where she will get intensive therapies. She would like to get more steady on her feet and not as much risk to fall. She does not want to burden her husband due to her has his own health issues. Will work on a safe plan for both of them.  Elease Hashimoto 07/09/2017, 9:43 AM

## 2017-07-09 NOTE — Care Management Note (Signed)
Inpatient [facility] Individual Statement of Services  Patient Name:  Krista Evans  Date:  07/09/2017  Welcome to the [facility].  Our goal is to provide you with an individualized program based on your diagnosis and situation, designed to meet your specific needs.  With this comprehensive rehabilitation program, you will be expected to participate in at least 3 hours of rehabilitation therapies Monday-Friday, with modified therapy programming on the weekends.  Your rehabilitation program will include the following services:  Physical Therapy (PT), Occupational Therapy (OT), Speech Therapy (ST), 24 hour per day rehabilitation nursing, Case Management (Social Worker), Rehabilitation Medicine, Nutrition Services and Pharmacy Services  Weekly team conferences will be held on Wednesday to discuss your progress.  Your Social Worker will talk with you frequently to get your input and to update you on team discussions.  Team conferences with you and your family in attendance may also be held.  Expected length of stay: 12-14 days Overall anticipated outcome: supervision verbal cues  Depending on your progress and recovery, your program may change. Your Social Worker will coordinate services and will keep you informed of any changes. Your Social Worker's name and contact numbers are listed  below.  The following services may also be recommended but are not provided by the [facility]:    [place] will be made to provide these services after discharge if needed.  Arrangements include referral to agencies that provide these services.  Your insurance has been verified to be: Health Team Advantage Your primary doctor is: Ramonita Lab   Pertinent information will be shared with your doctor and your insurance company.  Social Worker:  Ovidio Kin, [place] or ([phone]  Information discussed with and copy given to patient by: Elease Hashimoto, 07/09/2017, 9:45 AM

## 2017-07-09 NOTE — IPOC Note (Addendum)
Overall Plan of Care [facility]) Patient Details Name: Krista Evans MRN: [id number] DOB: 1945-06-16  Admitting Diagnosis: <principal problem not specified>  Hospital Problems: Active Problems:   Neurologic gait disorder     Functional Problem List: Nursing Bowel, Sensory, Endurance, Medication Management, Pain, Safety  PT Balance, Endurance, Motor, Pain, Safety, Sensory  OT Balance, Safety, Sensory, Endurance, Motor  SLP    TR         Basic ADL's: OT Grooming, Bathing, Dressing, Toileting     Advanced  ADL's: OT Simple Meal Preparation     Transfers: PT Bed Mobility, Bed to Chair, Car, Furniture, Floor  OT Toilet, Metallurgist: PT Ambulation, Emergency planning/management officer, Stairs     Additional Impairments: OT None  SLP        TR      Anticipated Outcomes Item Anticipated Outcome  Self Feeding No goal  Swallowing      Basic self-care  Supervision/cuing   Toileting  Supervision/cuing    Bathroom Transfers Supervision/cuing   Bowel/Bladder  manage bowel with mod I assist  Transfers  supervision  Locomotion  supervision with RW  Communication     Cognition     Pain  Pain at or below level 2  Safety/Judgment  maintain safety with cues/reminders/24 hr supervison   Therapy Plan: PT Intensity: Minimum of 1-2 x/day ,45 to 90 minutes PT Frequency: 5 out of 7 days PT Duration Estimated Length of Stay: 7-10 days OT Intensity: Minimum of 1-2 x/day, 45 to 90 minutes OT Frequency: 5 out of 7 days OT Duration/Estimated Length of Stay: 7-10 days      Team Interventions: Nursing Interventions Patient/Family Education, Pain Management, Medication Management, Discharge Planning, Bowel Management, Disease Management/Prevention  PT interventions Ambulation/gait training, Discharge planning, Therapeutic Activities, Functional mobility training, Balance/vestibular training, Neuromuscular re-education, Therapeutic Exercise, Wheelchair propulsion/positioning,  UE/LE Strength taining/ROM, Splinting/orthotics, Pain management, DME/adaptive equipment instruction, Cognitive remediation/compensation, Community reintegration, Functional electrical stimulation, Patient/family education, IT trainer, UE/LE Coordination activities  OT Interventions Training and development officer, Discharge planning, Functional electrical stimulation, Pain management, Self Care/advanced ADL retraining, Therapeutic Activities, UE/LE Coordination activities, Visual/perceptual remediation/compensation, Skin care/wound managment, Therapeutic Exercise, Patient/family education, Functional mobility training, Disease mangement/prevention, Cognitive remediation/compensation, Academic librarian, Engineer, drilling, Neuromuscular re-education, Psychosocial support, Splinting/orthotics, UE/LE Strength taining/ROM, Wheelchair propulsion/positioning  SLP Interventions    TR Interventions    SW/CM Interventions  Psychosocial Assessment, Pt & Family Education & Discharge Planning   Barriers to Discharge MD  Medical stability  Nursing      PT      OT (N/A)    SLP      SW       Team Discharge Planning: Destination: PT-Home ,OT- Home , SLP-  Projected Follow-up: PT-Outpatient PT, 24 hour supervision/assistance, OT-  Home health OT, SLP-  Projected Equipment Needs: PT-To be determined, OT- 3 in 1 bedside comode, Tub/shower bench, SLP-  Equipment Details: PT- , OT-TTB pending on if spouse agrees to it.  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 She was admitted 07/03/2017 for placement of VP shunt by Dr. Arnoldo Morale. Therapy evaluations done revealing weakness with balance deficits  as well as cognitive deficits affecting ADLs and mobility   Now requiring 24/7 Rehab  RN,MD, as well as CIR level PT, OT and SLP.  Treatment team will focus on ADLs and mobility with goals set at Sup   See Team Conference Notes for weekly updates to the plan of care

## 2017-07-09 NOTE — Progress Notes (Signed)
Occupational Therapy Session Note  Patient Details  Name: Krista Evans MRN: [id number] Date of Birth: 09-Jul-1945  Today's Date: 07/09/2017 OT Individual Time: 0700-0754 Session 2: 6415-8309 OT Individual Time Calculation (min): 54 min Session 2: 34 min  Short Term Goals: Week 1:  OT Short Term Goal 1 (Week 1): STGs=LTGs due to ELOS  Skilled Therapeutic Interventions/Progress Updates:    Session 1: Pt supine in bed agreeable to therapy with no c/o pain. Session focused on b/d tasks at shower level, functional mobility, and activity tolerance. Vc for RW management provided during functional mobility into bathroom. Pt completed 3/3 toileting tasks with CGA for safety during standing. Pt completed LB/UB bathing seated on TTB with (S). Pt recalled TTB transfer with 1 vc for RW management. Pt stood in shower completing anterior and posterior peri-cleansing with CGA and vc for grab bar use. Pt demo good carryover of RW management vc when completing functional mobility back to room following shower. Vc for UE use during stand to sit transfer and edu re safety awareness. Vc only required for pt to clasp bra posteriorly. Vc for orienting shirt but no manual A for any steps. Pt threaded pants and underwear sitting unsupported EOB with increased time for problem solving. Pt demo improved safety awareness while donning shoes, suggesting she sit to reduce fall risk. Pt was set up to eat breakfast in recliner and left with chair alarm set.    Session 2: Pt seated in recliner agreeable to therapy with no c/o pain throughout session. Session focused on handwriting d/t difficulty with writing checks. Pt was transported to therapy room for time management. Several blank checks were printed out for pt to practice with. Skilled observation of handwriting skills took place initially with various strategies trial-ed to improve legibility and speed, including a built up handle, tilted writing space to facilitate wrist  extension, and using a reinforced line to prevent writing below line. Pt's handwriting went from 50% to 75% legible with the use of a reinforced line, verbal cueing for wrist stability on table, and vc for pacing. Pt was returned to room and left with several more checks and a pen to practice with. Pt left in recliner with chair alarm set.   Therapy Documentation Precautions:  Precautions Precautions: Fall Restrictions Weight Bearing Restrictions: No   Vital Signs: Therapy Vitals Temp: 98 F (36.7 C) Temp Source: Oral Pulse Rate: 69 Resp: 18 BP: 128/62 Patient Position (if appropriate): Lying Oxygen Therapy SpO2: 97 % O2 Device: Room Air Pain: Pain Assessment Pain Scale: 0-10 Pain Score: 0-No pain ADL: ADL ADL Comments: Please see functional navigator for ADL status  See Function Navigator for Current Functional Status.   Therapy/Group: Individual Therapy  Curtis Sites 07/09/2017, 7:57 AM

## 2017-07-09 NOTE — Progress Notes (Signed)
Physical Therapy Session Note  Patient Details  Name: Krista Evans MRN: [id number] Date of Birth: Sep 15, 1945  Today's Date: 07/09/2017 PT Individual Time: 1345-1415 PT Individual Time Calculation (min): 30 min   Short Term Goals: Week 1:  PT Short Term Goal 1 (Week 1): = LTG  Skilled Therapeutic Interventions/Progress Updates:    Session focused on neuro re-ed to address gait impairments (with and without RW to challenge balance) and using Biodex for limits of stability (static and compliant level 9 for 2 trials) using BUE support (pt not comfortable without UE support at this time). Requires verbal and tactile cues for weightshifting during Biodex activities when going to the L or anterior and to the L. During gait this PM, pt demonstrating improved initiation of stepping sequence though continues to demonstrate wider BOS, decreased knee flexion (circumduction pattern > on L), and difficulty with motor planning/sequencing during turning or in tight environments. Pt overall close supervision to min assist throughout session for mobility. Improved sit <> stands with no needed facilitation for anterior weightshift like she needed this AM. Able to gait x 120' with RW x 2 and again without RW (HHA on L). Pt pleased with progress.   Therapy Documentation Precautions:  Precautions Precautions: Fall Restrictions Weight Bearing Restrictions: No   Pain: C/o ongoing neck/head pain - premedicated. Repositioned to tolerance at end of session with pillows.  See Function Navigator for Current Functional Status.   Therapy/Group: Individual Therapy  Canary Brim Ivory Broad, PT, DPT  07/09/2017, 2:20 PM

## 2017-07-09 NOTE — Progress Notes (Signed)
Subjective/Complaints: Patient seen  Sitting up at the edge of the bed this morning working with therapies. She states she slept well overnight. She states she had a good first in therapies yesterday.  ROS: Denies CP, SOB, NVD  Objective: Vital Signs: Blood pressure 128/62, pulse 69, temperature 98 F (36.7 C), temperature source Oral, resp. rate 18, height _0  (1.651 m), weight 56.4 kg (124 lb 5.4 oz), SpO2 97 %. No results found. Results for orders placed or performed during the hospital encounter of 07/07/17 (from the past 72 hour(s))  CBC WITH DIFFERENTIAL     Status: Abnormal   Collection Time: 07/08/17 10:36 AM  Result Value Ref Range   WBC 7.1 4.0 - 10.5 K/uL   RBC 3.80 (L) 3.87 - 5.11 MIL/uL   Hemoglobin 11.7 (L) 12.0 - 15.0 g/dL   HCT 36.3 36.0 - 46.0 %   MCV 95.5 78.0 - 100.0 fL   MCH 30.8 26.0 - 34.0 pg   MCHC 32.2 30.0 - 36.0 g/dL   RDW 13.6 11.5 - 15.5 %   Platelets 280 150 - 400 K/uL   Neutrophils Relative % 59 %   Neutro Abs 4.2 1.7 - 7.7 K/uL   Lymphocytes Relative 27 %   Lymphs Abs 1.9 0.7 - 4.0 K/uL   Monocytes Relative 10 %   Monocytes Absolute 0.7 0.1 - 1.0 K/uL   Eosinophils Relative 3 %   Eosinophils Absolute 0.2 0.0 - 0.7 K/uL   Basophils Relative 1 %   Basophils Absolute 0.1 0.0 - 0.1 K/uL   Immature Granulocytes 0 %   Abs Immature Granulocytes 0.0 0.0 - 0.1 K/uL    Comment: Performed at [facility], [address], [place], [place] [address]  Comprehensive metabolic panel     Status: Abnormal   Collection Time: 07/08/17 10:36 AM  Result Value Ref Range   Sodium 136 135 - 145 mmol/L   Potassium 4.1 3.5 - 5.1 mmol/L   Chloride 102 101 - 111 mmol/L   CO2 26 22 - 32 mmol/L   Glucose, Bld 97 65 - 99 mg/dL   BUN 22 (H) 6 - 20 mg/dL   Creatinine, Ser 0.87 0.44 - 1.00 mg/dL   Calcium 9.1 8.9 - 10.3 mg/dL   Total Protein 7.5 6.5 - 8.1 g/dL   Albumin 3.5 3.5 - 5.0 g/dL   AST 27 15 - 41 U/L   ALT 21 14 - 54 U/L   Alkaline Phosphatase 64 38 -  126 U/L   Total Bilirubin 0.6 0.3 - 1.2 mg/dL   GFR calc non Af Amer >60 >60 mL/min   GFR calc Af Amer >60 >60 mL/min    Comment: (NOTE) The eGFR has been calculated using the CKD EPI equation. This calculation has not been validated in all clinical situations. eGFR's persistently <60 mL/min signify possible Chronic Kidney Disease.    Anion gap 8 5 - 15    Comment: Performed at [facility] [address] [place], [place] [address]     Constitutional: No distress . Vital signs reviewed. HENT: Normocephalic.  Atraumatic. Eyes: EOMI. No discharge. Cardiovascular: RRR. No JVD. Respiratory: CTA Bilaterally. Normal effort. GI: BS +. Non-distended. Musc: No edema or tenderness in extremities. Neuro: Alert and Oriented Motor: 4/5 grossly throughout Skin:   Incisions C/D/I Psych: Normal mood and behavior.  Assessment/Plan: 1. 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  Physiatrist is providing close team supervision and 24 hour management of active medical problems listed below. Physiatrist and rehab team continue to assess barriers to discharge/monitor patient progress toward functional and medical goals. FIM: Function - Bathing Position: Shower Body parts bathed by patient: Right arm, Left arm, Chest, Abdomen, Front perineal area, Buttocks, Right upper leg, Left upper leg, Right lower leg, Left lower leg Body parts bathed by helper: Back Assist Level: Supervision or verbal cues  Function- Upper Body Dressing/Undressing What is the patient wearing?: Bra, Pull over shirt/dress Bra - Perfomed by patient: Thread/unthread right bra strap, Thread/unthread left bra strap, Hook/unhook bra (pull down sports bra) Bra - Perfomed by helper: Hook/unhook bra (pull down sports bra) Pull over shirt/dress - Perfomed by patient: Thread/unthread right sleeve,  Thread/unthread left sleeve, Put head through opening, Pull shirt over trunk Assist Level: Supervision or verbal cues Function - Lower Body Dressing/Undressing What is the patient wearing?: Underwear, Pants, Shoes Position: Sitting EOB Underwear - Performed by patient: Thread/unthread right underwear leg, Thread/unthread left underwear leg, Pull underwear up/down Pants- Performed by patient: Thread/unthread right pants leg, Thread/unthread left pants leg, Pull pants up/down Non-skid slipper socks- Performed by patient: Don/doff right sock, Don/doff left sock Shoes - Performed by patient: Don/doff right shoe, Don/doff left shoe Assist for footwear: Supervision/touching assist Assist for lower body dressing: Touching or steadying assistance (Pt > 75%)  Function - Toileting Toileting steps completed by patient: Adjust clothing prior to toileting, Performs perineal hygiene, Adjust clothing after toileting Assist level: Touching or steadying assistance (Pt.75%)  Function - Air cabin crew transfer assistive device: Walker, Elevated toilet seat/BSC over toilet, Grab bar Assist level to toilet: Touching or steadying assistance (Pt > 75%) Assist level from toilet: Touching or steadying assistance (Pt > 75%)  Function - Chair/bed transfer Chair/bed transfer method: Ambulatory Chair/bed transfer assist level: Touching or steadying assistance (Pt > 75%) Chair/bed transfer assistive device: Walker, Armrests Chair/bed transfer details: Verbal cues for precautions/safety, Verbal cues for safe use of DME/AE  Function - Locomotion: Wheelchair Will patient use wheelchair at discharge?: No Function - Locomotion: Ambulation Assistive device: Walker-rolling Max distance: 100' Assist level: Touching or steadying assistance (Pt > 75%) Assist level: Touching or steadying assistance (Pt > 75%) Assist level: Touching or steadying assistance (Pt > 75%) Assist level: Touching or steadying assistance  (Pt > 75%)  Function - Comprehension Comprehension: Auditory Comprehension assist level: Understands basic 75 - 89% of the time/ requires cueing 10 - 24% of the time  Function - Expression Expression: Verbal Expression assist level: Expresses basic 90% of the time/requires cueing < 10% of the time.  Function - Social Interaction Social Interaction assist level: Interacts appropriately 75 - 89% of the time - Needs redirection for appropriate language or to initiate interaction.  Function - Problem Solving Problem solving assist level: Solves basic 90% of the time/requires cueing < 10% of the time  Function - Memory Memory assist level: Recognizes or recalls 75 - 89% of the time/requires cueing 10 - 24% of the time Patient normally able to recall (first 3 days only): That he or she is in a hospital  Medical Problem List and Plan: 1. Gait disorder and decline in ADLs secondary to Communicating hydrocephalus  Cont CIR  Notes reviewed, images reviewed, labs reviewed 2. DVT Prophylaxis/Anticoagulation: Mechanical:Sequential compression devices, below kneeBilateral lower extremities 3. Pain Management:Continue Norco prn for HA/pain 4. Mood:LCSW to follow for evaluation and support. 5. Neuropsych: This patientiscapable of making decisions onherown behalf. 6. Skin/Wound  Care:routine pressure relief measures, Wounds look good. 7. Fluids/Electrolytes/Nutrition:monitor I/O.   8. Constipation:   Cont colace BID, increased Miralax to BID 9. Acute blood loss anemia  Hemoglobin 11.7 on 6/23  Cont to monitor  LOS (Days) 2 A FACE TO FACE EVALUATION WAS PERFORMED  Ramey Ketcherside Lorie Phenix 07/09/2017, 11:56 AM

## 2017-07-09 NOTE — Progress Notes (Signed)
Pt has history of Celiac disease and on regular diet.  Discussed with pt per PA request. Pt able to verbalize foods that she should not eat and reports that has not had an issue with any of the meals.  She reports that has been able to order alt foods to avoid any issues. No changes at this time.

## 2017-07-10 ENCOUNTER — Inpatient Hospital Stay (HOSPITAL_COMMUNITY): Payer: PPO

## 2017-07-10 ENCOUNTER — Inpatient Hospital Stay (HOSPITAL_COMMUNITY): Payer: PPO | Admitting: Occupational Therapy

## 2017-07-10 ENCOUNTER — Inpatient Hospital Stay (HOSPITAL_COMMUNITY): Payer: PPO | Admitting: Physical Therapy

## 2017-07-10 DIAGNOSIS — R0989 Other specified symptoms and signs involving the circulatory and respiratory systems: Secondary | ICD-10-CM

## 2017-07-10 NOTE — Progress Notes (Signed)
Physical Therapy Session Note  Patient Details  Name: Krista Evans MRN: [id number] Date of Birth: 1946-01-01  Today's Date: 07/10/2017 PT Individual Time: 1515-1625 PT Individual Time Calculation (min): 70 min   Short Term Goals: Week 1:  PT Short Term Goal 1 (Week 1): = LTG  Skilled Therapeutic Interventions/Progress Updates:    Pt seated in recliner upon PT arrival, agreeable to therapy tx and denies pain. Pt ambulated from room>gym x 150 ft with RW and min assist, verbal cues for increased step length and RW management. Pt worked on dynamic standing balance this session without UE support including toe taps on 6 inch step, standing on airex with eyes open/eyes closed,standing on airex while tossing horseshoes, and lateral stepping in place over object, min assist for balance. Pt ambulated to the dayroom with RW and min assist x 120 ft, verbal cues for step length and upright posture. Pt used biodex for limits of stability training, x 2 trials with UE support and x 1 trial without UE support. Pt ambulated to the nustep with RW, used nustep x 6 minutes on workload 5 for global strengthening and endurance. Pt transported back to gym. Pt worked on dynamic standing balance without UE support and L UE coordination in order to participate in ball toss, x 2 trials. Pt transported back to room at end of session and ambulated from w/c>recliner with RW and min assist, left seated with chair alarm set and needs in reach.   Therapy Documentation Precautions:  Precautions Precautions: Fall Restrictions Weight Bearing Restrictions: No   See Function Navigator for Current Functional Status.   Therapy/Group: Individual Therapy  Netta Corrigan, PT, DPT 07/10/2017, 12:50 PM

## 2017-07-10 NOTE — Progress Notes (Signed)
Physical Therapy Session Note  Patient Details  Name: Krista Evans MRN: [id number] Date of Birth: Jun 24, 1945  Today's Date: 07/10/2017 PT Individual Time: 0800-0855 PT Individual Time Calculation (min): 55 min   Short Term Goals: Week 1:  PT Short Term Goal 1 (Week 1): = LTG  Skilled Therapeutic Interventions/Progress Updates:   Pt in supine and agreeable to therapy, pain 4/10 in back of neck and head this morning. RN present w/ tylenol. Session focused on functional endurance and pregait tasks in standing. Provided set-up assist to don UE and LE garments w/ increased time and verbal cues for energy conservation strategies to eliminate need to perform multiple stands. Ambulated to/from therapy gym w/ close supervision and verbal cues for RW management. Pt washed hands and performed pericare/LE garment management w/ supervision as well. Brief seated rest while RN present providing medications. Total assist w/c transport to/from therapy gym. Performed pregait tasks while standing at walker including reciprocal stepping forward and backward w/ emphasis on foot clearance and increased hip flexion as well as decreasing BOS. Ambulated 51' x2 reps w/ RW and min guard for safety w/ verbal cues for increasing hip flexion and decreasing BOS. Returned to room and ended session in recliner, call bell within reach and all needs met.   Therapy Documentation Precautions:  Precautions Precautions: Fall Restrictions Weight Bearing Restrictions: No Vital Signs:   See Function Navigator for Current Functional Status.   Therapy/Group: Individual Therapy  Ashvin Adelson K Arnette 07/10/2017, 9:01 AM

## 2017-07-10 NOTE — Evaluation (Signed)
Recreational Therapy Assessment and Plan  Patient Details  Name: DREONNA HUSSEIN MRN: [id number] Date of Birth: August 31, 1945 Today's Date: 07/10/2017  Rehab Potential: Good ELOS: 10 days  Assessment Problem List:      Patient Active Problem List   Diagnosis Date Noted  . 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 Hydrocephalus 07/03/2017  . 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 Pelvic fracture ([place]) 04/18/2016  . CD (celiac disease) 08/04/2014  . Cancer of upper lobe of right lung ([place]) 08/04/2014  . Age related osteoporosis 11/26/2013  . Absolute anemia 05/21/2013  . H/O malignant neoplasm 05/21/2013  . HLD (hyperlipidemia) 05/21/2013    Past Medical History:      Past Medical History:  Diagnosis Date  . Abnormal Q waves on electrocardiogram   . Anemia   . Aortic atherosclerosis ([place])    "I could possibly have it, my grandmother had it"  . Cancer [facility]) 05/2011   Right upper Lung CA with partial Lobectomy.  . Celiac disease   . Celiac disease   . Dyspnea    with exertion  . Hyperlipidemia   . Hyperlipidemia   . Lung mass   . Meningioma ([place])   . Osteoporosis    Past Surgical History:       Past Surgical History:  Procedure Laterality Date  . LUNG LOBECTOMY     right lung  . 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 year old female with recent admission to the hospital on 07/03/2017 for placement of VP shunt.  Patient transferred to CIR on 07/07/2017 .  Pt presents with decreased activity tolerance, decreased functional mobility, decreased balance, decreased coordination Limiting pt's independence with leisure/community pursuits.  Plan Min 1 TR session for community reintegration/education >60 minutes during LOS Recommendations  for other services: None   Discharge Criteria: Patient will be discharged from TR if patient refuses treatment 3 consecutive times without medical reason.  If treatment goals not met, if there is a change in medical status, if patient makes no progress towards goals or if patient is discharged from hospital.  The above assessment, treatment plan, treatment alternatives and goals were discussed and mutually agreed upon: by patient  [place] 07/10/2017, 1:08 PM

## 2017-07-10 NOTE — Progress Notes (Signed)
Subjective/Complaints: Patient seen sitting up in bed this morning. She states she slept well overnight. She requests more sugar for her coffee.  ROS: denies CP, SOB, NVD  Objective: Vital Signs: Blood pressure (!) 145/63, pulse 74, temperature 98.5 F (36.9 C), temperature source Oral, resp. rate 17, height 5' 5"  (1.651 m), weight 56.4 kg (124 lb 5.4 oz), SpO2 94 %. No results found. Results for orders placed or performed during the hospital encounter of 07/07/17 (from the past 72 hour(s))  CBC WITH DIFFERENTIAL     Status: Abnormal   Collection Time: 07/08/17 10:36 AM  Result Value Ref Range   WBC 7.1 4.0 - 10.5 K/uL   RBC 3.80 (L) 3.87 - 5.11 MIL/uL   Hemoglobin 11.7 (L) 12.0 - 15.0 g/dL   HCT 36.3 36.0 - 46.0 %   MCV 95.5 78.0 - 100.0 fL   MCH 30.8 26.0 - 34.0 pg   MCHC 32.2 30.0 - 36.0 g/dL   RDW 13.6 11.5 - 15.5 %   Platelets 280 150 - 400 K/uL   Neutrophils Relative % 59 %   Neutro Abs 4.2 1.7 - 7.7 K/uL   Lymphocytes Relative 27 %   Lymphs Abs 1.9 0.7 - 4.0 K/uL   Monocytes Relative 10 %   Monocytes Absolute 0.7 0.1 - 1.0 K/uL   Eosinophils Relative 3 %   Eosinophils Absolute 0.2 0.0 - 0.7 K/uL   Basophils Relative 1 %   Basophils Absolute 0.1 0.0 - 0.1 K/uL   Immature Granulocytes 0 %   Abs Immature Granulocytes 0.0 0.0 - 0.1 K/uL    Comment: Performed at [facility], [address], [place], [place] [address]  Comprehensive metabolic panel     Status: Abnormal   Collection Time: 07/08/17 10:36 AM  Result Value Ref Range   Sodium 136 135 - 145 mmol/L   Potassium 4.1 3.5 - 5.1 mmol/L   Chloride 102 101 - 111 mmol/L   CO2 26 22 - 32 mmol/L   Glucose, Bld 97 65 - 99 mg/dL   BUN 22 (H) 6 - 20 mg/dL   Creatinine, Ser 0.87 0.44 - 1.00 mg/dL   Calcium 9.1 8.9 - 10.3 mg/dL   Total Protein 7.5 6.5 - 8.1 g/dL   Albumin 3.5 3.5 - 5.0 g/dL   AST 27 15 - 41 U/L   ALT 21 14 - 54 U/L   Alkaline Phosphatase 64 38 - 126 U/L   Total Bilirubin 0.6 0.3 - 1.2 mg/dL   GFR calc non Af Amer >60 >60 mL/min   GFR calc Af Amer >60 >60 mL/min    Comment: (NOTE) The eGFR has been calculated using the CKD EPI equation. This calculation has not been validated in all clinical situations. eGFR's persistently <60 mL/min signify possible Chronic Kidney Disease.    Anion gap 8 5 - 15    Comment: Performed at [facility] [address], [place], [place] [address]     Constitutional: No distress . Vital signs reviewed. HENT: Normocephalic.  Atraumatic. Eyes: EOMI. No discharge. Cardiovascular: RRR. No JVD. Respiratory: CTA Bilaterally. Normal effort. GI: BS +. Non-distended. Musc: No edema or tenderness in extremities. Neuro: Alert and Oriented Motor: 4-4+/5 grossly throughout Skin:   Incisions C/D/I Psych: Normal mood and behavior.  Assessment/Plan: 1. 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 Physiatrist is providing close team supervision and 24 hour management of  active medical problems listed below. Physiatrist and rehab team continue to assess barriers to discharge/monitor patient progress toward functional and medical goals. FIM: Function - Bathing Position: Shower Body parts bathed by patient: Right arm, Left arm, Chest, Abdomen, Front perineal area, Buttocks, Right upper leg, Left upper leg, Right lower leg, Left lower leg Body parts bathed by helper: Back Assist Level: Supervision or verbal cues  Function- Upper Body Dressing/Undressing What is the patient wearing?: Bra, Pull over shirt/dress Bra - Perfomed by patient: Thread/unthread right bra strap, Thread/unthread left bra strap, Hook/unhook bra (pull down sports bra) Bra - Perfomed by helper: Hook/unhook bra (pull down sports bra) Pull over shirt/dress - Perfomed by patient: Thread/unthread right sleeve, Thread/unthread left sleeve, Put head through opening, Pull  shirt over trunk Assist Level: Supervision or verbal cues Function - Lower Body Dressing/Undressing What is the patient wearing?: Underwear, Pants, Shoes Position: Sitting EOB Underwear - Performed by patient: Thread/unthread right underwear leg, Thread/unthread left underwear leg, Pull underwear up/down Pants- Performed by patient: Thread/unthread right pants leg, Thread/unthread left pants leg, Pull pants up/down Non-skid slipper socks- Performed by patient: Don/doff right sock, Don/doff left sock Shoes - Performed by patient: Don/doff right shoe, Don/doff left shoe Assist for footwear: Supervision/touching assist Assist for lower body dressing: Touching or steadying assistance (Pt > 75%)  Function - Toileting Toileting steps completed by patient: Adjust clothing prior to toileting, Adjust clothing after toileting, Performs perineal hygiene Toileting steps completed by helper: Performs perineal hygiene Toileting Assistive Devices: Grab bar or rail Assist level: Touching or steadying assistance (Pt.75%)  Function - Air cabin crew transfer assistive device: Walker, Elevated toilet seat/BSC over toilet, Grab bar Assist level to toilet: Touching or steadying assistance (Pt > 75%) Assist level from toilet: Touching or steadying assistance (Pt > 75%)  Function - Chair/bed transfer Chair/bed transfer method: Stand pivot Chair/bed transfer assist level: Touching or steadying assistance (Pt > 75%) Chair/bed transfer assistive device: Armrests, Walker Chair/bed transfer details: Verbal cues for precautions/safety, Verbal cues for safe use of DME/AE  Function - Locomotion: Wheelchair Will patient use wheelchair at discharge?: No Function - Locomotion: Ambulation Assistive device: Walker-rolling Max distance: 4' Assist level: Touching or steadying assistance (Pt > 75%) Assist level: Touching or steadying assistance (Pt > 75%) Assist level: Touching or steadying assistance (Pt >  75%) Assist level: Touching or steadying assistance (Pt > 75%)  Function - Comprehension Comprehension: Auditory Comprehension assist level: Understands basic 75 - 89% of the time/ requires cueing 10 - 24% of the time  Function - Expression Expression: Verbal Expression assist level: Expresses basic 90% of the time/requires cueing < 10% of the time.  Function - Social Interaction Social Interaction assist level: Interacts appropriately 75 - 89% of the time - Needs redirection for appropriate language or to initiate interaction.  Function - Problem Solving Problem solving assist level: Solves basic 90% of the time/requires cueing < 10% of the time  Function - Memory Memory assist level: Recognizes or recalls 90% of the time/requires cueing < 10% of the time Patient normally able to recall (first 3 days only): Staff names and faces, That he or she is in a hospital  Medical Problem List and Plan: 1. 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 DVT Prophylaxis/Anticoagulation: Mechanical:Sequential compression devices, below kneeBilateral lower extremities 3. Pain Management:Continue Norco prn for HA/pain 4. Mood:LCSW to follow for evaluation and support. 5. Neuropsych: This patientiscapable of making decisions onherown behalf. 6. Skin/Wound Care:routine  pressure relief measures, Wounds look good. 7. Fluids/Electrolytes/Nutrition:monitor I/O.   8. Constipation:   Cont colace BID, increased Miralax to BID  Improving 9. Acute blood loss anemia  Hemoglobin 11.7 on 6/23  Cont to monitor 10. Blood pressure  Slightly labile, but overall controlled on 6/25  LOS (Days) 3 A FACE TO FACE EVALUATION WAS PERFORMED  Joson Sapp Lorie Phenix 07/10/2017, 9:17 AM

## 2017-07-10 NOTE — Progress Notes (Signed)
Occupational Therapy Session Note  Patient Details  Name: Krista Evans MRN: [id number] Date of Birth: Nov 13, 1945  Today's Date: 07/10/2017 OT Individual Time: 1005-1100 OT Individual Time Calculation (min): 55 min   Short Term Goals: Week 1:  OT Short Term Goal 1 (Week 1): STGs=LTGs due to ELOS  Skilled Therapeutic Interventions/Progress Updates:    OT treatment session focused on functional ambulation, L NMR and fine motor control. Pt ambulated to therapy gym with RW and CGA. 2 standing rest breaks within ambulation. Provided pt with medium soft red theraputty and exercise handout. Worked on pinch strength, grip strength, and in-hand manipulation. Progressed to graded peg board task using small pegs. Had pt pick up 2 at a time and focus on palmer translation  And rotation to bring into pincer grasp prior to placing in board. Pt returned to room at end of session vis wc for time management. Pt left seated in recliner at end of session with chair alarm and needs met.   Therapy Documentation Precautions:  Precautions Precautions: Fall Restrictions Weight Bearing Restrictions: No ADL: ADL ADL Comments: Please see functional navigator for ADL status  See Function Navigator for Current Functional Status.   Therapy/Group: Individual Therapy  Valma Cava 07/10/2017, 10:46 AM

## 2017-07-11 ENCOUNTER — Ambulatory Visit (HOSPITAL_COMMUNITY): Payer: PPO | Admitting: *Deleted

## 2017-07-11 ENCOUNTER — Inpatient Hospital Stay (HOSPITAL_COMMUNITY): Payer: PPO

## 2017-07-11 ENCOUNTER — Inpatient Hospital Stay (HOSPITAL_COMMUNITY): Payer: PPO | Admitting: Occupational Therapy

## 2017-07-11 NOTE — Progress Notes (Signed)
Occupational Therapy Session Note  Patient Details  Name: Krista Evans MRN: [id number] Date of Birth: 1945-05-29  Today's Date: 07/11/2017 OT Individual Time: 1300-1400 OT Individual Time Calculation (min): 60 min    Short Term Goals: Week 1:  OT Short Term Goal 1 (Week 1): STGs=LTGs due to ELOS Week 2:     Skilled Therapeutic Interventions/Progress Updates:    1:1 Self care retraining at shower level with focus on functional ambulation/ mobility around room and over thresholds with HHA. Pt always stops and needs extra time to cross thresholds - she is very cautious. Pt able to bathe sit to stand with supervision as well as dress at EOB with close supervision.  Ambulated with supervision with RW to the dayroom. Performed limits of stability and weight shifts laterally on Biodex with and without stable base without UE support.  PT with more difficulty with weight shifts to the right requiring facilitation.  Ambulated to the gym with HHA/ min A to the gym and performed greater weight shifts on the rocker board with focus on weight shifts to the right and maintaining balance with UE support.   REturned to room and in recliner.   Therapy Documentation Precautions:  Precautions Precautions: Fall Restrictions Weight Bearing Restrictions: No General:   Vital Signs: Therapy Vitals Temp: 97.7 F (36.5 C) Temp Source: Oral Pulse Rate: 78 BP: 127/66 Patient Position (if appropriate): Sitting Oxygen Therapy SpO2: 98 % O2 Device: Room Air Pain: No c/ o in session  ADL: ADL ADL Comments: Please see functional navigator for ADL status  See Function Navigator for Current Functional Status.   Therapy/Group: Individual Therapy  Willeen Cass [facility] 07/11/2017, 4:28 PM

## 2017-07-11 NOTE — Progress Notes (Signed)
Social Work Patient ID: Krista Evans, female   DOB: 21-Oct-1945, 72 y.o.   MRN: [id number]  Met with pt to discuss team conference goals supervision level and target discharge date 6/29. Have also called husband who is not planning on coming in today but aware of the discharge and pt's goals. Will work on discharge needs.

## 2017-07-11 NOTE — Progress Notes (Signed)
Physical Therapy Session Note  Patient Details  Name: ANECIA NUSBAUM MRN: [id number] Date of Birth: 1945/09/19  Today's Date: 07/11/2017 PT Individual Time: 0800-0915 AND 1000-1045 AND 1130-1200 PT Individual Time Calculation (min): 75 min AND 45 min AND 30 min   Short Term Goals: Week 1:  PT Short Term Goal 1 (Week 1): = LTG  Skilled Therapeutic Interventions/Progress Updates:    Session 1: Pt with RN upon PT arrival, agreeable to therapy tx and denies pain. Pt requesting to use bathroom, ambulated from bed<>bathroom with RW and CGA, verbal cues for increased step length and narrow BOS. Pt performed toileting with close supervision, ambulated to sink with RW and CGA to wash hands and don dentures. Pt ambulated to bed with RW and CGA, seated EOB performed upper and lower body dressing with min assist to don sports bra, otherwise supervision. Pt ambulated to w/c with RW and CGA. Pt transported outside in w/c. Pt worked on community distance ambulation and ambulating on unlevel surfaces while outside in hospital courtyard. Pt ambulated 2 x 100 ft with RW and min assist, verbal cues for step length, narrow BOS and RW management. Pt worked on dynamic standing balance to perform marches in place, x 1 trial with UE support on RW and x 1 trial without UE support, min assist. Pt transported back to gym in unit. Pt trialed use of rollator for ambulation, min assist x 80 ft with verbal cues for device management. Pt reports she has a rollator at home, but feels like it goes too fast. Pt worked on balance and ambulation to ambulate without AD 2 x 30 ft with min assist, verbal cues for L arm swing/relaxation and step length. Pt transported back to room and transferred to recliner, left seated with needs in reach and chair alarm set.   Session 2:  Pt seated in recliner upon PT arrival, agreeable to therapy tx and reports pain in R neck area, described as an ache. Recreational therapist present throughout session  for co-treat, educated pt on lateral cervical flexion stretch for pain relief. Pt requesting to use bathroom, ambulated from bed<>bathroom with RW and CGA, verbal cues for increased step length and narrow BOS. Pt performed toileting with close supervision, ambulated to sink with RW and CGA to wash hands. Pt ambulated to the gym with RW and CGA. PT and RT dicussed planned community outing for tomorrow with the pt regarding where pt will go and what to expect. Pt worked on dynamic standing balance without UE support to perform ball toss activity, x 1 trial standing with feet shoulder width apart, x 1 trial incorporating mini squat before throwing, and x1 trial while standing in staggered stance. Pt worked on ambulation without AD ambulating from gym<>rehabaprtment x 80 ft in each direction with min assist, transferred on and off recliner. Pt transported back to room in w/c and transferred to recliner, left seated with needs in reach and chair alarm set.   Session 3: Pt seated in recliner upon PT arrival, agreeable to therapy tx and denies pain. Pt ambulated to the gym with RW and CGA x 150 ft. Pt ascended/descended 4 steps x 2 with B UE support on single R handrail, min assist and verbal cues for techniques. Pt worked on dynamic standing balance while performing toe taps on 4 inch step. Pt ambulated back to room with RW and CGA, left seated in recliner with needs in reach and chair alarm set.     Therapy Documentation Precautions:  Precautions Precautions: Fall Restrictions Weight Bearing Restrictions: No   See Function Navigator for Current Functional Status.   Therapy/Group: Individual Therapy  Netta Corrigan, PT, DPT 07/11/2017, 7:45 AM

## 2017-07-11 NOTE — Progress Notes (Signed)
Recreational Therapy Session Note  Patient Details  Name: Krista Evans MRN: [id number] Date of Birth: [birth year] Today's Date: 07/11/2017 Time:  10-1028 Pain: c/o R neck pain, premedicated, minor relief with rest break Skilled Therapeutic Interventions/Progress Updates: Session focused on activity tolerance, ambulation with RW, dynamic standing balance during co-treat with PT.  Also discussed community reintegration to be scheduled tomorrow and pt agreeable to participate.  Pt ambulated from her room to the gym with RW with contact guard assist.  Pt stood for ball toss/tap tasks with contact guard assist with various foot placements for balance challenge.  Pt with intermittent c/o R neck pain needing seated rest breaks.  Therapy/Group: Co-Treatment   [name] 07/11/2017, 3:44 PM

## 2017-07-11 NOTE — Progress Notes (Signed)
Subjective/Complaints: Pt seen lying in bed this AM.  She states she slept well overnight.  She feels she is getting stronger.    ROS: Denies CP, SOB, NVD  Objective: Vital Signs: Blood pressure 140/66, pulse 70, temperature 98.1 F (36.7 C), temperature source Oral, resp. rate 16, height 5' 5"  (1.651 m), weight 56.3 kg (124 lb 1.9 oz), SpO2 98 %. No results found. No results found for this or any previous visit (from the past 72 hour(s)).   Constitutional: No distress . Vital signs reviewed. HENT: Normocephalic.  Atraumatic. Eyes: EOMI. No discharge. Cardiovascular: RRR. No JVD. Respiratory: CTA Bilaterally. Normal effort. GI: BS +. Non-distended. Musc: No edema or tenderness in extremities. Neuro: Alert and Oriented Motor: 4-4+/5 grossly throughout (stable) Skin:   Incisions C/D/I Psych: Normal mood and behavior.  Assessment/Plan: 1. 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 Physiatrist is providing close team supervision and 24 hour management of active medical problems listed below. Physiatrist and rehab team continue to assess barriers to discharge/monitor patient progress toward functional and medical goals. FIM: Function - Bathing Position: Shower Body parts bathed by patient: Right arm, Left arm, Chest, Abdomen, Front perineal area, Buttocks, Right upper leg, Left upper leg, Right lower leg, Left lower leg Body parts bathed by helper: Back Assist Level: Supervision or verbal cues  Function- Upper Body Dressing/Undressing What is the patient wearing?: Bra, Pull over shirt/dress Bra - Perfomed by patient: Thread/unthread right bra strap, Thread/unthread left bra strap, Hook/unhook bra (pull down sports bra) Bra - Perfomed by helper: Hook/unhook bra (pull down sports bra) Pull over shirt/dress - Perfomed by patient: Thread/unthread right sleeve,  Thread/unthread left sleeve, Put head through opening, Pull shirt over trunk Assist Level: Supervision or verbal cues Function - Lower Body Dressing/Undressing What is the patient wearing?: Underwear, Pants, Shoes Position: Sitting EOB Underwear - Performed by patient: Thread/unthread right underwear leg, Thread/unthread left underwear leg, Pull underwear up/down Pants- Performed by patient: Thread/unthread right pants leg, Thread/unthread left pants leg, Pull pants up/down Non-skid slipper socks- Performed by patient: Don/doff right sock, Don/doff left sock Shoes - Performed by patient: Don/doff right shoe, Don/doff left shoe Assist for footwear: Supervision/touching assist Assist for lower body dressing: Touching or steadying assistance (Pt > 75%)  Function - Toileting Toileting steps completed by patient: Adjust clothing prior to toileting, Performs perineal hygiene, Adjust clothing after toileting Toileting steps completed by helper: Adjust clothing prior to toileting, Adjust clothing after toileting(per Gaston Islam, NT report) Toileting Assistive Devices: Grab bar or rail Assist level: Touching or steadying assistance (Pt.75%)  Function - Air cabin crew transfer assistive device: Walker, Elevated toilet seat/BSC over toilet, Grab bar Assist level to toilet: Touching or steadying assistance (Pt > 75%) Assist level from toilet: Touching or steadying assistance (Pt > 75%)  Function - Chair/bed transfer Chair/bed transfer method: Stand pivot, Ambulatory Chair/bed transfer assist level: Touching or steadying assistance (Pt > 75%) Chair/bed transfer assistive device: Armrests, Walker Chair/bed transfer details: Verbal cues for precautions/safety, Verbal cues for safe use of DME/AE  Function - Locomotion: Wheelchair Will patient use wheelchair at discharge?: No Function - Locomotion: Ambulation Assistive device: Walker-rolling Max distance: 80 ft Assist level: Touching or  steadying assistance (Pt > 75%) Assist level: Touching or steadying assistance (Pt > 75%) Walk 50 feet with 2 turns activity did not occur: Safety/medical concerns(per report) Assist level: Touching or steadying assistance (Pt > 75%) Walk 150 feet  activity did not occur: Safety/medical concerns(per report) Assist level: Touching or steadying assistance (Pt > 75%) Assist level: Touching or steadying assistance (Pt > 75%)  Function - Comprehension Comprehension: Auditory Comprehension assist level: Follows basic conversation/direction with no assist  Function - Expression Expression: Verbal Expression assist level: Expresses basic needs/ideas: With no assist  Function - Social Interaction Social Interaction assist level: Interacts appropriately 90% of the time - Needs monitoring or encouragement for participation or interaction.  Function - Problem Solving Problem solving assist level: Solves basic problems with no assist  Function - Memory Memory assist level: Recognizes or recalls 90% of the time/requires cueing < 10% of the time Patient normally able to recall (first 3 days only): Staff names and faces, That he or she is in a hospital  Medical Problem List and Plan: 1. 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 DVT Prophylaxis/Anticoagulation: Mechanical:Sequential compression devices, below kneeBilateral lower extremities 3. Pain Management:Continue Norco prn for HA/pain 4. Mood:LCSW to follow for evaluation and support. 5. Neuropsych: This patientiscapable of making decisions onherown behalf. 6. Skin/Wound Care:routine pressure relief measures, Wounds look good. 7. Fluids/Electrolytes/Nutrition:monitor I/O.   8. Constipation:   Cont colace BID, increased Miralax to BID  Improving 9. Acute blood loss anemia  Hemoglobin 11.7 on 6/23  Cont to monitor 10. Blood pressure  Relatively controlled on 6/26  LOS (Days)  4 A FACE TO FACE EVALUATION WAS PERFORMED  Damian Buckles Lorie Phenix 07/11/2017, 12:11 PM

## 2017-07-11 NOTE — Patient Care Conference (Signed)
Inpatient RehabilitationTeam Conference and Plan of Care Update Date: 07/11/2017   Time: 11:25 AM    Patient Name: Krista Evans      Medical Record Number: [id number]  Date of Birth: 1945/05/30 Sex: Female         Room/Bed: 4W19C/4W19C-01 Payor Info: Payor: [employer]AGE / Plan: Tennis Must / Product Type: *No Product type* /    Admitting Diagnosis: hydrocephalus  Admit Date/Time:  07/07/2017  3:14 PM Admission Comments: No comment available   Primary Diagnosis:  <principal problem not specified> Principal Problem: <principal problem not specified>  Patient Active Problem List   Diagnosis Date Noted  . Labile blood pressure   . Acute blood loss anemia   . Slow transit constipation   . Vascular headache   . 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 Hydrocephalus 07/03/2017  . 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 Pelvic fracture ([place]) 04/18/2016  . CD (celiac disease) 08/04/2014  . Cancer of upper lobe of right lung ([place]) 08/04/2014  . Age related osteoporosis 11/26/2013  . Absolute anemia 05/21/2013  . H/O malignant neoplasm 05/21/2013  . HLD (hyperlipidemia) 05/21/2013    Expected Discharge Date: Expected Discharge Date: 07/14/17  Team Members Present: Physician leading conference: Dr. Delice Lesch Social Worker Present: Ovidio Kin, LCSW Nurse Present: Rayetta Humphrey, RN PT Present: Michaelene Song, PT OT Present: Cherylynn Ridges, OT;Roanna Epley, COTA SLP Present: Windell Moulding, SLP PPS Coordinator present : Daiva Nakayama, RN, CRRN     Current Status/Progress Goal Weekly Team Focus  Medical   Gait disorder and decline in ADLs secondary to Communicating hydrocephalus  Improve mobility, BP, constipation  See above   Bowel/Bladder   continent of bowel & bladder, LBM 07/10/17, has colace & miralax  remain continent with min assist  assist as needed   Swallow/Nutrition/ Hydration             ADL's   Min A /supervision overall  Supervision  L fine motor  coordination, balance, atcivity tolerance, pt/family education   Mobility   minA/CGA overall with RW  supervision  L NMR, coordination, balance, activity tolerance, pt/family ed, d/c planning   Communication             Safety/Cognition/ Behavioral Observations            Pain   c/o pain to surgical site on her the posterior right side of her head, she has tylenol & Norco prn  pain scale <4  assess & treat as needed   Skin   staples to posterior right scalp & mid upper abdomen  no new areas of skin break down  assess q shift      *See Care Plan and progress notes for long and short-term goals.     Barriers to Discharge  Current Status/Progress Possible Resolutions Date Resolved   Physician    Medical stability     See above  Therapies, optimize BP meds, follow labs, increase bowel reg      Nursing                  PT                    OT (N/A)                SLP                SW  Discharge Planning/Teaching Needs:  Home with husband who can provide supervision level, would like her to be safer on her feet, before leaving here.      Team Discussion:  Goals supervision level due to safety issues. Working on fine motor coordination and balance issues. MD adjusting BP meds and bowels better. Activity tolerance and endurance are issues but are getting better. Needs speech evaluation for cognition  Revisions to Treatment Plan:  DC 6/29    Continued Need for Acute Rehabilitation Level of Care: The patient requires daily medical management by a physician with specialized training in physical medicine and rehabilitation for the following conditions: Daily direction of a multidisciplinary physical rehabilitation program to ensure safe treatment while eliciting the highest outcome that is of practical value to the patient.: Yes Daily medical management of patient stability for increased activity during participation in an intensive rehabilitation regime.:  Yes Daily analysis of laboratory values and/or radiology reports with any subsequent need for medication adjustment of medical intervention for : Neurological problems;Blood pressure problems  Kally Cadden, Gardiner Rhyme 07/11/2017, 2:19 PM

## 2017-07-12 ENCOUNTER — Inpatient Hospital Stay (HOSPITAL_COMMUNITY): Payer: PPO | Admitting: Occupational Therapy

## 2017-07-12 ENCOUNTER — Encounter (HOSPITAL_COMMUNITY): Payer: PPO | Admitting: *Deleted

## 2017-07-12 ENCOUNTER — Inpatient Hospital Stay (HOSPITAL_COMMUNITY): Payer: PPO | Admitting: Physical Therapy

## 2017-07-12 ENCOUNTER — Inpatient Hospital Stay (HOSPITAL_COMMUNITY): Payer: PPO | Admitting: Speech Pathology

## 2017-07-12 DIAGNOSIS — R4189 Other symptoms and signs involving cognitive functions and awareness: Secondary | ICD-10-CM

## 2017-07-12 NOTE — Progress Notes (Signed)
Subjective/Complaints: Patient seen sitting up in bed eating breakfast this morning. She states she slept well overnight. She is happy to hear about her discharge day being Saturday. She has questions about staple removal.  ROS: denies CP, SOB, NVD  Objective: Vital Signs: Blood pressure 118/62, pulse 69, temperature 98 F (36.7 C), temperature source Oral, resp. rate 16, height 5' 5"  (1.651 m), weight 56.3 kg (124 lb 1.9 oz), SpO2 96 %. No results found. No results found for this or any previous visit (from the past 72 hour(s)).   Constitutional: No distress . Vital signs reviewed. HENT: Normocephalic.  Atraumatic. Eyes: EOMI. No discharge. Cardiovascular: RRR. No JVD. Respiratory: CTA Bilaterally. Normal effort. GI: BS +. Non-distended. Musc: No edema or tenderness in extremities. Neuro: Alert and Oriented Motor: 4-4+/5 grossly throughout (unchanged) Skin:   Incisions C/D/I Psych: Normal mood and behavior.  Assessment/Plan: 1. 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 Physiatrist is providing close team supervision and 24 hour management of active medical problems listed below. Physiatrist and rehab team continue to assess barriers to discharge/monitor patient progress toward functional and medical goals. FIM: Function - Bathing Position: Shower Body parts bathed by patient: Right arm, Left arm, Chest, Abdomen, Front perineal area, Buttocks, Right upper leg, Left upper leg, Right lower leg, Left lower leg Body parts bathed by helper: Back Assist Level: Supervision or verbal cues  Function- Upper Body Dressing/Undressing What is the patient wearing?: Bra, Pull over shirt/dress Bra - Perfomed by patient: Thread/unthread right bra strap, Thread/unthread left bra strap, Hook/unhook bra (pull down sports bra) Bra - Perfomed by helper: Hook/unhook bra (pull down  sports bra) Pull over shirt/dress - Perfomed by patient: Thread/unthread right sleeve, Thread/unthread left sleeve, Put head through opening, Pull shirt over trunk Assist Level: Supervision or verbal cues Function - Lower Body Dressing/Undressing What is the patient wearing?: Underwear, Pants, Shoes Position: Sitting EOB Underwear - Performed by patient: Thread/unthread right underwear leg, Thread/unthread left underwear leg, Pull underwear up/down Pants- Performed by patient: Thread/unthread right pants leg, Thread/unthread left pants leg, Pull pants up/down Non-skid slipper socks- Performed by patient: Don/doff right sock, Don/doff left sock Shoes - Performed by patient: Don/doff right shoe, Don/doff left shoe Assist for footwear: Supervision/touching assist Assist for lower body dressing: Touching or steadying assistance (Pt > 75%)  Function - Toileting Toileting steps completed by patient: Adjust clothing prior to toileting, Performs perineal hygiene, Adjust clothing after toileting Toileting steps completed by helper: Adjust clothing prior to toileting, Adjust clothing after toileting(per Gaston Islam, NT report) Toileting Assistive Devices: Grab bar or rail Assist level: Touching or steadying assistance (Pt.75%)  Function - Air cabin crew transfer assistive device: Walker, Elevated toilet seat/BSC over toilet, Grab bar Assist level to toilet: Touching or steadying assistance (Pt > 75%) Assist level from toilet: Touching or steadying assistance (Pt > 75%)  Function - Chair/bed transfer Chair/bed transfer method: Stand pivot, Ambulatory Chair/bed transfer assist level: Touching or steadying assistance (Pt > 75%) Chair/bed transfer assistive device: Armrests, Walker Chair/bed transfer details: Verbal cues for precautions/safety, Verbal cues for safe use of DME/AE  Function - Locomotion: Wheelchair Will patient use wheelchair at discharge?: No Function - Locomotion:  Ambulation Assistive device: Walker-rolling Max distance: 80 ft Assist level: Touching or steadying assistance (Pt > 75%) Assist level: Touching or steadying assistance (Pt > 75%) Walk 50 feet with 2 turns activity did not occur: Safety/medical concerns(per report) Assist level:  Touching or steadying assistance (Pt > 75%) Walk 150 feet activity did not occur: Safety/medical concerns(per report) Assist level: Touching or steadying assistance (Pt > 75%) Assist level: Touching or steadying assistance (Pt > 75%)  Function - Comprehension Comprehension: Auditory Comprehension assist level: Follows basic conversation/direction with no assist  Function - Expression Expression: Verbal Expression assist level: Expresses basic needs/ideas: With no assist  Function - Social Interaction Social Interaction assist level: Interacts appropriately 90% of the time - Needs monitoring or encouragement for participation or interaction.  Function - Problem Solving Problem solving assist level: Solves basic problems with no assist  Function - Memory Memory assist level: Recognizes or recalls 90% of the time/requires cueing < 10% of the time Patient normally able to recall (first 3 days only): Staff names and faces, That he or she is in a hospital  Medical Problem List and Plan: 1. Gait disorder and decline in ADLs secondary to Communicating hydrocephalus  Cont CIR  Plan to DC staples tomorrow 2. DVT Prophylaxis/Anticoagulation: Mechanical:Sequential compression devices, below kneeBilateral lower extremities 3. Pain Management:Continue Norco prn for HA/pain 4. Mood:LCSW to follow for evaluation and support. 5. Neuropsych: This patientiscapable of making decisions onherown behalf.  SLP evaluation ordered - likely higher-level 6. Skin/Wound Care:routine pressure relief measures 7. Fluids/Electrolytes/Nutrition:monitor I/O.   8. Constipation:   Cont colace BID, increased Miralax to  BID  Improving overall 9. Acute blood loss anemia  Hemoglobin 11.7 on 6/23  Cont to monitor 10. Blood pressure  Controlled on 6/27  LOS (Days) 5 A FACE TO FACE EVALUATION WAS PERFORMED  Daphine Loch Lorie Phenix 07/12/2017, 10:40 AM

## 2017-07-12 NOTE — Progress Notes (Signed)
Physical Therapy Session Note  Patient Details  Name: [name] MRN: [id number] Date of Birth: 29-May-1945  Today's Date: 07/12/2017 PT Individual Time: 1000-1125 PT Individual Time Calculation (min): 85 min   Short Term Goals: Week 1:  PT Short Term Goal 1 (Week 1): = LTG  Skilled Therapeutic Interventions/Progress Updates:   Pt participated in community reintegration/outing to [place] at overall supervision ambulatory level using RW. Supervision level assist required for occasional verbal cues for safety awareness, RW management, and gait pattern. Goals focused on safe community mobility, identification & negotiation of obstacles, accessing public restroom, energy conservation techniques/education. See outing goal sheet in shadow chart for full details. Pt able to demonstrated good awareness of fatigue level and initiated seated rest breaks throughout the outing. Pt stated appreciation for the outing and the extensive education provided.  Therapy Documentation Precautions:  Precautions Precautions: Fall Restrictions Weight Bearing Restrictions: No Pain: Pain Assessment Pain Scale: 0-10 Pain Score: 0-No pain Pain Type: Acute pain Pain Location: Head Pain Orientation: Right Pain Descriptors / Indicators: Aching Pain Frequency: Intermittent Pain Onset: Gradual Patients Stated Pain Goal: 4 Pain Intervention(s): Medication (See eMAR)  See Function Navigator for Current Functional Status.   Therapy/Group: Individual Therapy  Denzil Mceachron K Arnette 07/12/2017, 11:58 AM

## 2017-07-12 NOTE — Progress Notes (Signed)
Recreational Therapy Discharge Summary Patient Details  Name: HESSIE VARONE MRN: [id number] Date of Birth: Nov 02, 1945 Today's Date: 07/12/2017  Comments on progress toward goals: Pt has made excellent progress during LOS and is ready for discharge home with family 6/29 at supervision ambulatory level.  TR sessions focused on activity tolerance, energy conservation, discharge planning and community reintegration.  Goals met. Reasons for discharge: discharge from hospital  Patient/family agrees with progress made and goals achieved: Yes  Iley Breeden 07/12/2017, 11:34 AM

## 2017-07-12 NOTE — Progress Notes (Signed)
Occupational Therapy Session Note  Patient Details  Name: Krista Evans MRN: [id number] Date of Birth: 1945/06/24  Today's Date: 07/12/2017 OT Individual Time: 1400-1500 OT Individual Time Calculation (min): 60 min    Short Term Goals: Week 1:  OT Short Term Goal 1 (Week 1): STGs=LTGs due to ELOS  Skilled Therapeutic Interventions/Progress Updates:    Pt seen for OT session focusing on functional transfers and activity tolerance. Pt sitting up in recliner upon arrival with husband present. Pt agreeable to tx session.  Attempted to complete family education/ discussion with pt and husband regarding home bathroom set-up and DME. Discussion of shower chair vs tub transfer bench. Therapist recommending tub transfer bench to reduce fall risk and reduce caregiver burden as pt requires assist with shower stall transfer. Upon discussion, pt's husband stated he refused seeing demonstration or practicing with use of tub transfer bench and stated pt will be using shower stall at d/c. He refused hands on or demonstration training during session and left following discussion. Pt apologetic for husband's behavior. She ambulated throughout room with close supervision using RW, toileting transfer and tasks completed with supervision.  She ambulated to ADL apartment, requiring VCs for RW management as pt with difficulty maintaining straight trajectory and staying inside RW. In apartment, completed simulated shower stall transfers using RW and simulated tub transfer bench. Steadying assist provided for shower stall transfer and supervision assist for transfer bench. Pt voiced feeling more comfortable with use of tub bench. At end of session, CSW made aware of events of session with pt's husband and recommendations. In therapy gym, completed x4 sets of 5 sit>stand without UE support, mat being lowered on subsequent on trials. Completed with supervision overall with cuing and demonstration provided throughout for  effective techniques.   Completed floor transfer in simulation of fall at home. Reviewed what to do in case of fall including calling 911 if injury is suspected. Pt completed transfer with min A following demonstration and cuing for technique. Pt returned to room at end of session, left seated in recliner with all needs in reach and chair alarm on.    Therapy Documentation Precautions:  Precautions Precautions: Fall Restrictions Weight Bearing Restrictions: No Pain:    No/denies pain ADL: ADL ADL Comments: Please see functional navigator for ADL status  See Function Navigator for Current Functional Status.   Therapy/Group: Individual Therapy  Vanessa Alesi L 07/12/2017, 7:13 AM

## 2017-07-12 NOTE — Progress Notes (Signed)
Recreational Therapy Session Note  Patient Details  Name: Krista Evans MRN: [id number] Date of Birth: December 14, 1945 Today's Date: 07/12/2017 Time:  1000-1130  Pain: no c/o, premedicated Skilled Therapeutic Interventions/Progress Updates: Pt participated in community reintegration/outing to [place] at overall supervision ambulatory level using RW. Goals focused on safe community mobility, identification & negotiation of obstacles, accessing public restroom, energy conservation techniques/education.  See outing goal sheet in shadow chart for full details.  Pt able to demonstrated good awareness of fatigue level and initiated seated rest breaks throughout the outing.  Pt stated appreciation for the outing and the extensive education provided.   Therapy/Group: [employer]  William Laske 07/12/2017, 11:32 AM

## 2017-07-12 NOTE — Evaluation (Signed)
Speech Language Pathology Assessment and Plan  Patient Details  Name: Krista Evans MRN: [id number] Date of Birth: 1945/06/14   Today's Date: 07/12/2017 SLP Individual Time: 0900-1000 SLP Individual Time Calculation (min): 60 min   Problem List:  Patient Active Problem List   Diagnosis Date Noted  . Cognitive deficits   . Labile blood pressure   . Acute blood loss anemia   . Slow transit constipation   . Vascular headache   . 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 Hydrocephalus 07/03/2017  . 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 Pelvic fracture ([place]) 04/18/2016  . CD (celiac disease) 08/04/2014  . Cancer of upper lobe of right lung ([place]) 08/04/2014  . Age related osteoporosis 11/26/2013  . Absolute anemia 05/21/2013  . H/O malignant neoplasm 05/21/2013  . HLD (hyperlipidemia) 05/21/2013   Past Medical History:  Past Medical History:  Diagnosis Date  . Abnormal Q waves on electrocardiogram   . Anemia   . Aortic atherosclerosis ([place])    "I could possibly have it, my grandmother had it"  . Cancer [facility]) 05/2011   Right upper Lung CA with partial Lobectomy.  . Celiac disease   . Celiac disease   . Dyspnea    with exertion  . Hyperlipidemia   . Hyperlipidemia   . Lung mass   . Meningioma ([place])   . Osteoporosis    Past Surgical History:  Past Surgical History:  Procedure Laterality Date  . LUNG LOBECTOMY     right lung  . 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 She was admitted 07/03/2017 for placement of VP shunt by Dr. Arnoldo Morale. Therapy evaluations done revealing  weakness with balance deficits as well as cognitive deficits affecting ADLs and mobility. CIR was recommended for follow-up therapy.  SLP evaluation completed on 07/12/2017 with results as follows:  Pt presents with grossly intact cognitive-linguistic function.  Pt demonstrated appropriate safety awareness as well as task organization and sequencing during ADLs and scored 30/30 on the Mini Mental State Exam.  Pt reports that she initially had difficulty with speech intelligibility and word finding immediately post shunt placement; however, that has gotten better every day and pt suspects that lingering deficits are mostly related to medication effects given that they only occur after taking Vicodin.    As a result, no further acute ST needs are indicated at this time.  I discussed home health or outpatient therapy as a resource should pt have cognitive-linguistic concerns in the future; however, I do not think it likely that the pt will need these services as pt is so close to her baseline and husband was assisting with IADLs such as cooking and med management prior to admission.    Skilled Therapeutic Interventions          Cognitive-linguistic evaluation completed with results and recommendations reviewed with patient.     SLP Assessment  Patient does not need any further Speech [facility] Pathology Services    Recommendations  Patient destination: Home Follow up Recommendations: None Equipment Recommended: None recommended by SLP           Pain Pain Assessment Pain Scale: 0-10  Pain Score: 1  Pain Type: Acute pain Pain Location: Head Pain Orientation: Right Pain Descriptors / Indicators: Aching Pain Frequency: Intermittent Pain Onset: Gradual Patients Stated Pain Goal: 4 Pain Intervention(s): RN made aware  Prior Functioning Cognitive/Linguistic Baseline: Within functional limits Type of Home: House  Lives With: Spouse Available Help at Discharge: Family;Available 24  hours/day Vocation: Retired  Function:  Eating Eating                 Cognition Comprehension Comprehension assist level: Follows basic conversation/direction with no assist  Expression   Expression assist level: Expresses basic needs/ideas: With no assist  Social Interaction Social Interaction assist level: Interacts appropriately 90% of the time - Needs monitoring or encouragement for participation or interaction.  Problem Solving Problem solving assist level: Solves basic problems with no assist  Memory Memory assist level: Recognizes or recalls 90% of the time/requires cueing < 10% of the time     Recommendations for other services: None   Discharge Criteria: Patient will be discharged from SLP if patient refuses treatment 3 consecutive times without medical reason, if treatment goals not met, if there is a change in medical status, if patient makes no progress towards goals or if patient is discharged from hospital.  The above assessment, treatment plan, treatment alternatives and goals were discussed and mutually agreed upon: by patient  Emilio Math 07/12/2017, 1:26 PM

## 2017-07-13 ENCOUNTER — Inpatient Hospital Stay (HOSPITAL_COMMUNITY): Payer: PPO | Admitting: Physical Therapy

## 2017-07-13 ENCOUNTER — Inpatient Hospital Stay (HOSPITAL_COMMUNITY): Payer: PPO | Admitting: Occupational Therapy

## 2017-07-13 MED ORDER — DOCUSATE SODIUM 100 MG PO CAPS: 100.0000 mg | ORAL_CAPSULE | Freq: Two times a day (BID) | ORAL | 0 refills | Status: DC

## 2017-07-13 MED ORDER — HYDROCODONE-ACETAMINOPHEN 5-325 MG PO TABS: 1.0000 | ORAL_TABLET | Freq: Four times a day (QID) | ORAL | 0 refills | Status: DC | PRN

## 2017-07-13 NOTE — Progress Notes (Signed)
Occupational Therapy Discharge Summary  Patient Details  Name: Krista Evans MRN: [id number] Date of Birth: 1945/11/17   Patient has met 9 of 9 long term goals due to improved activity tolerance, improved balance, postural control, ability to compensate for deficits,  improved attention, improved awareness and improved coordination.  Patient to discharge at overall Supervision level.  Patient's husband is the reported caregiver at d/c. CSW reports that pt's husband is in agreement to provide the recommended 24hr assist at d/c, however, he has refused hands on training with pt during OT session and did not attend therapy.   Recommendation:  Patient will benefit from ongoing skilled OT services in home health setting to continue to advance functional skills in the area of BADL, iADL and Reduce care partner burden.  Equipment: BSC, recommending use of tub transfer bench, however, pt's husband refused  Reasons for discharge: treatment goals met and discharge from hospital  Patient/family agrees with progress made and goals achieved: Yes  OT Discharge Precautions/Restrictions  Precautions Precautions: Fall Restrictions Weight Bearing Restrictions: No ADL ADL ADL Comments: Please see functional navigator for ADL status Vision Baseline Vision/History: Wears glasses Wears Glasses: At all times Patient Visual Report: No change from baseline Vision Assessment?: No apparent visual deficits Perception  Perception: Within Functional Limits Praxis Praxis: Intact Cognition Overall Cognitive Status: Within Functional Limits for tasks assessed Arousal/Alertness: Awake/alert Orientation Level: Oriented X4 Attention: Selective Sustained Attention: Appears intact Selective Attention: Appears intact Memory: Appears intact Awareness: Impaired Problem Solving: Impaired Problem Solving Impairment: Verbal basic;Functional complex Safety/Judgment: Appears intact Sensation Sensation Light  Touch: Appears Intact Hot/Cold: Appears Intact Proprioception: Appears Intact Additional Comments: Reports minimal tingling in L hand, hwoever, much improved since admission.  Coordination Gross Motor Movements are Fluid and Coordinated: No Fine Motor Movements are Fluid and Coordinated: No Coordination and Movement Description: MIld dysmetria w/ UE tasks, and mild LE coordination deficits/ataxic  Motor  Motor Motor: Hemiplegia;Ataxia Motor - Discharge Observations: Mild L hemi Trunk/Postural Assessment  Cervical Assessment Cervical Assessment: Exceptions to WFL(Forward head) Thoracic Assessment Thoracic Assessment: Exceptions to WFL(Kyphotic) Lumbar Assessment Lumbar Assessment: Exceptions to WFL(Posterior pelvic tilt) Postural Control Postural Control: Deficits on evaluation  Balance Balance Balance Assessed: Yes Standardized Balance Assessment Standardized Balance Assessment: Timed Up and Go Test;Berg Balance Test Berg Balance Test Sit to Stand: Able to stand without using hands and stabilize independently Standing Unsupported: Able to stand safely 2 minutes Sitting with Back Unsupported but Feet Supported on Floor or Stool: Able to sit safely and securely 2 minutes Stand to Sit: Controls descent by using hands Transfers: Able to transfer safely, minor use of hands Standing Unsupported with Eyes Closed: Able to stand 10 seconds with supervision Standing Ubsupported with Feet Together: Able to place feet together independently and stand for 1 minute with supervision From Standing, Reach Forward with Outstretched Arm: Can reach forward >12 cm safely (5") From Standing Position, Pick up Object from Floor: Able to pick up shoe, needs supervision From Standing Position, Turn to Look Behind Over each Shoulder: Looks behind one side only/other side shows less weight shift Turn 360 Degrees: Able to turn 360 degrees safely but slowly Standing Unsupported, Alternately Place Feet on  Step/Stool: Able to complete >2 steps/needs minimal assist Dynamic Sitting Balance Dynamic Sitting - Balance Support: During functional activity;Feet supported Dynamic Sitting - Level of Assistance: 5: Stand by assistance;6: Modified independent (Device/Increase time) Static Standing Balance Static Standing - Balance Support: During functional activity Static Standing - Level of Assistance: 5: Stand  by assistance Dynamic Standing Balance Dynamic Standing - Balance Support: During functional activity Dynamic Standing - Level of Assistance: 5: Stand by assistance Dynamic Standing - Comments: During bathing/dressing tasks Extremity/Trunk Assessment RUE Assessment RUE Assessment: Within Functional Limits LUE Assessment LUE Assessment: Within Functional Limits   See Function Navigator for Current Functional Status.  Eric Nees L 07/13/2017, 10:40 AM

## 2017-07-13 NOTE — Progress Notes (Signed)
Social Work  Discharge Note  The overall goal for the admission was met for: DC-SAT 6/29  Discharge location: Yes-HOME WITH HUSBAND WHO CAN PROVIDE 24 HR SUPERVISION  Length of Stay: Yes-7 DAYS  Discharge activity level: Yes-SUPERVISION WITH SOME CUES  Home/community participation: Yes  Services provided included: MD, RD, PT, OT, SLP, RN, CM, Pharmacy and [place]  Follow-up services arranged: Home Health: [facility] and Patient/Family has no preference for HH/DME agencies  Comments (or additional information):HUSBAND HAS BEEN IN FOR EDUCATION AND HE CAN PROVIDE 24 HR SUPERVISION  Patient/Family verbalized understanding of follow-up arrangements: Yes  Individual responsible for coordination of the follow-up plan: SELF & LARRY-HUSBAND  Confirmed correct DME delivered: Elease Hashimoto 07/13/2017    Elease Hashimoto

## 2017-07-13 NOTE — Progress Notes (Signed)
Physical Therapy Discharge Summary  Patient Details  Name: Krista Evans MRN: [id number] Date of Birth: [birth year]  Today's Date: 07/13/2017 PT Individual Time: 3710-6269 AND 1455-1545 PT Individual Time Calculation (min): 75 min AND 50 min  Session 1:  Pt in recliner and agreeable to therapy, pain as detailed below. Session focused on functional mobility and balance. Pt ambulated around unit in >150' bouts w/ supervision using RW, negotiated 4 steps as per home set-up, and performed car transfer w/ supervision. Additionally trial-ed use of rollator as this is what she used previously, however decreased safety w/ rollator requiring close supervision to min guard. Pt will continue to use her standard walker around the home for safety until balance and coordination improves and she discusses use of rollator w/ her physical therapist after d/c. Additionally discussed and practiced navigating home-like environment requiring tight spaces w/ RW and discussed taking her time when moving over area rugs and carpet thresholds. Pt verbalized understanding. Performed Berg Balance Scale as detailed below and explained significance of results to pt. Performed NuStep 10 min @ level 4 for LE strengthening in reciprocal movement pattern. Returned to room and ended session in recliner, call bell within reach and all needs met.   Session 2:  Pt in recliner and agreeable to therapy, denies pain. Session focused on functional mobility and challenging static standing balance to mimic standing to perform tasks at sink or at counter top at home. Ambulated to/from therapy gym w/ supervision using RW. Worked on static standing while standing on compliant surface w/o UE support, performing card matching task. Close supervision overall w/ mod assist to correct 1 LOB posteriorly. Verbal and visual cues for ankle balance strategies. Encouraged pt with how well she is doing, she is very fearful, limiting her postural control muscles  from appropriately activating. Returned to room and ended session in recliner, call bell within reach and all needs met. Reinforced discussion of husband and/or son providing supervision level assist. Pt continues to demonstrate good carryover of maintaining adequate safety awareness and being able to express her needs.   Patient has met 7 of 7 long term goals due to improved activity tolerance, improved balance, improved postural control, increased strength, improved attention, improved awareness and improved coordination. Patient to discharge at an ambulatory level Supervision.  Patient's care partner, husband, has not been present for education on providing supervision level assist despite requests from staff. However, pt is able to communicate her needs and level of assist to this therapist w/o cues and anticipate she will be able to express her needs to her husband.    Reasons goals not met: n/a  Recommendation:  Patient will benefit from ongoing skilled PT services in home health setting to continue to advance safe functional mobility, address ongoing impairments in functional balance, quality of gait, LE strengthening, and coordination, and minimize fall risk.  Equipment: No equipment provided  Reasons for discharge: treatment goals met and discharge from hospital  Patient/family agrees with progress made and goals achieved: Yes  PT Discharge Precautions/Restrictions Precautions Precautions: Fall Restrictions Weight Bearing Restrictions: No Pain Pain Assessment Pain Scale: 0-10 Pain Score: 1  Pain Location: Neck Pain Orientation: Posterior Pain Descriptors / Indicators: Aching Pain Onset: On-going Vision/Perception  Perception Perception: Within Functional Limits Praxis Praxis: Intact  Cognition Overall Cognitive Status: Within Functional Limits for tasks assessed Arousal/Alertness: Awake/alert Orientation Level: Oriented X4 Attention: Selective Sustained Attention:  Appears intact Selective Attention: Appears intact Memory: Appears intact Awareness: Impaired Problem Solving: Impaired Problem Solving  Impairment: Verbal basic;Functional complex Safety/Judgment: Appears intact Sensation Sensation Light Touch: Appears Intact Hot/Cold: Appears Intact Proprioception: Appears Intact Additional Comments: Reports minimal tingling in L hand, hwoever, much improved since admission.  Coordination Gross Motor Movements are Fluid and Coordinated: No Fine Motor Movements are Fluid and Coordinated: No Coordination and Movement Description: MIld dysmetria w/ UE tasks, and mild LE coordination deficits/ataxic  Motor  Motor Motor: Hemiplegia;Ataxia Motor - Discharge Observations: Mild L hemi  Mobility Bed Mobility Bed Mobility: Supine to Sit;Rolling Left;Rolling Right;Sit to Supine Rolling Right: Independent Rolling Left: Independent Supine to Sit: Independent Sit to Supine: Independent Transfers Transfers: Stand Pivot Transfers Stand Pivot Transfers: Set up assist Transfer (Assistive device): Rolling walker Locomotion  Gait Ambulation: Yes Gait Assistance: Supervision/Verbal cueing Gait Distance (Feet): 150 Feet Assistive device: Rolling walker Gait Gait: Yes Gait Pattern: Wide base of support;Ataxic;Shuffle Gait velocity: decreased Stairs / Additional Locomotion Stairs: Yes Stairs Assistance: Supervision/Verbal cueing Stair Management Technique: One rail Right Number of Stairs: 4 Height of Stairs: 6 Wheelchair Mobility Wheelchair Mobility: No  Trunk/Postural Assessment  Cervical Assessment Cervical Assessment: Within Functional Limits Thoracic Assessment Thoracic Assessment: Within Functional Limits Lumbar Assessment Lumbar Assessment: Within Functional Limits Postural Control Postural Control: Within Functional Limits  Balance Balance Balance Assessed: Yes Standardized Balance Assessment Standardized Balance Assessment: Timed Up  and Go Test;Berg Balance Test Berg Balance Test Sit to Stand: Able to stand without using hands and stabilize independently Standing Unsupported: Able to stand safely 2 minutes Sitting with Back Unsupported but Feet Supported on Floor or Stool: Able to sit safely and securely 2 minutes Stand to Sit: Controls descent by using hands Transfers: Able to transfer safely, minor use of hands Standing Unsupported with Eyes Closed: Able to stand 10 seconds with supervision Standing Ubsupported with Feet Together: Able to place feet together independently and stand for 1 minute with supervision From Standing, Reach Forward with Outstretched Arm: Can reach forward >12 cm safely (5") From Standing Position, Pick up Object from Floor: Able to pick up shoe, needs supervision From Standing Position, Turn to Look Behind Over each Shoulder: Looks behind one side only/other side shows less weight shift Turn 360 Degrees: Able to turn 360 degrees safely but slowly Standing Unsupported, Alternately Place Feet on Step/Stool: Able to complete >2 steps/needs minimal assist Standing Unsupported, One Foot in Front: Needs help to step but can hold 15 seconds Standing on One Leg: Unable to try or needs assist to prevent fall Total Score: 38 Dynamic Sitting Balance Dynamic Sitting - Balance Support: During functional activity;Feet supported Dynamic Sitting - Level of Assistance: 5: Stand by assistance Static Standing Balance Static Standing - Balance Support: During functional activity;No upper extremity supported Static Standing - Level of Assistance: 5: Stand by assistance Dynamic Standing Balance Dynamic Standing - Balance Support: During functional activity;No upper extremity supported Dynamic Standing - Level of Assistance: 5: Stand by assistance Extremity Assessment  RUE Assessment RUE Assessment: Within Functional Limits LUE Assessment LUE Assessment: Within Functional Limits RLE Assessment RLE Assessment:  Exceptions to [facility] General Strength Comments: 4-/5 globally LLE Assessment LLE Assessment: Exceptions to [facility] General Strength Comments: 3+/5 globally   See Function Navigator for Current Functional Status.  Jamera Vanloan K Arnette 07/13/2017, 10:57 AM

## 2017-07-13 NOTE — Discharge Instructions (Signed)
Inpatient Rehab Discharge Instructions  [place] Discharge date and time: No discharge date for patient encounter.   Activities/Precautions/ Functional Status: Activity: activity as tolerated Diet: regular diet Wound Care: none needed Functional status:  ___ No restrictions     ___ Walk up steps independently ___ 24/7 supervision/assistance   ___ Walk up steps with assistance ___ Intermittent supervision/assistance  ___ Bathe/dress independently ___ Walk with walker     _x__ Bathe/dress with assistance ___ Walk Independently    ___ Shower independently ___ Walk with assistance    ___ Shower with assistance ___ No alcohol     ___ Return to work/school ________  Special Instructions: No driving  No aspirin products until follow-up with neurosurgery Dr. Newman Pies   COMMUNITY REFERRALS UPON DISCHARGE:    Home Health:   PT & OT   Agency:[facility] Phone:[phone]   Date of last service:07/14/2017  Medical Equipment/Items Ordered:HAS FROM PREVIOUS ADMITS    My questions have been answered and I understand these instructions. I will adhere to these goals and the provided educational materials after my discharge from the hospital.  Patient/Caregiver Signature _______________________________ Date __________  Clinician Signature _______________________________________ Date __________  Please bring this form and your medication list with you to all your follow-up doctor's appointments.

## 2017-07-13 NOTE — Progress Notes (Signed)
Occupational Therapy Session Note  Patient Details  Name: Krista Evans MRN: [id number] Date of Birth: [birth year]  Today's Date: 07/13/2017 OT Individual Time: 0730-0826 OT Individual Time Calculation (min): 56 min    Short Term Goals: Week 1:  OT Short Term Goal 1 (Week 1): STGs=LTGs due to ELOS  Skilled Therapeutic Interventions/Progress Updates:    Pt seen for OT ADL bathing/dressing session. Pt awake in bed upon arrival, having just finished breakfast and agreeable to tx session. MD reports staples being removed today prior to d/c home tomorrow. Staples covered during AM shower this morning as not yet removed, however, MD reports pt cleared to wash hair following removal of staples.  Pt ambulated throughout session using RW with supervision. She gathered clothing items from drawers and completed functional transfers from toilet and tub transfer bench with supervision and VCs for reassurance of proper technique. She bathed seated on tub bench, standing with use of grab bars to complete pericare/buttock hygiene. She returned to chair to dress. Cont to report some tingling in L hand though reports much improved since admission. Demonstrates ability to use hand functionally to assist with donning bra and openning self care items. Following seated rest break, pt ambulated to therapy day room. Completed simulated simple meal prep task utilizing microwave. Discussed problem solving how to transport items while managing RW, etc. Pt demonstrated good problem solving abilities to transport items and manage RW. Pt returned to room at end of session, left seated in recliner with chair alarm on and all needs in reach.     Therapy Documentation Precautions:  Precautions Precautions: Fall Restrictions Weight Bearing Restrictions: No Pain: Pain Assessment Pain Scale: 0-10 Pain Score: 0-No pain  ADL: ADL ADL Comments: Please see functional navigator for ADL status  See Function Navigator for  Current Functional Status.   Therapy/Group: Individual Therapy  Kristy Schomburg L 07/13/2017, 7:01 AM

## 2017-07-13 NOTE — Progress Notes (Signed)
Subjective/Complaints: Patient seen sitting up in bed this morning. She states she slept well overnight. She states she really enjoyed going shopping yesterday. Discussed bathing with therapies.  ROS: denies CP, SOB, NVD  Objective: Vital Signs: Blood pressure 134/68, pulse 75, temperature 98 F (36.7 C), temperature source Oral, resp. rate 18, height 5' 5"  (1.651 m), weight 56.3 kg (124 lb 1.9 oz), SpO2 97 %. No results found. No results found for this or any previous visit (from the past 72 hour(s)).   Constitutional: No distress . Vital signs reviewed. HENT: Normocephalic.  Atraumatic. Eyes: EOMI. No discharge. Cardiovascular: RRR. No JVD. Respiratory: CTA Bilaterally. Normal effort. GI: BS +. Non-distended. Musc: No edema or tenderness in extremities. Neuro: Alert and Oriented Motor: 4-4+/5 grossly throughout (stable) Skin:   Incisions C/D/I Psych: Normal mood and behavior.  Assessment/Plan: 1. 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 Physiatrist is providing close team supervision and 24 hour management of active medical problems listed below. Physiatrist and rehab team continue to assess barriers to discharge/monitor patient progress toward functional and medical goals. FIM: Function - Bathing Position: Shower Body parts bathed by patient: Right arm, Left arm, Chest, Abdomen, Front perineal area, Buttocks, Right upper leg, Left upper leg, Right lower leg, Left lower leg, Back Body parts bathed by helper: Back Assist Level: Supervision or verbal cues  Function- Upper Body Dressing/Undressing What is the patient wearing?: Bra, Pull over shirt/dress Bra - Perfomed by patient: Thread/unthread right bra strap, Thread/unthread left bra strap, Hook/unhook bra (pull down sports bra) Bra - Perfomed by helper: Hook/unhook bra (pull down sports bra) Pull over  shirt/dress - Perfomed by patient: Thread/unthread right sleeve, Thread/unthread left sleeve, Put head through opening, Pull shirt over trunk Assist Level: More than reasonable time Function - Lower Body Dressing/Undressing What is the patient wearing?: Underwear, Pants, Shoes Position: Wheelchair/chair at sink Underwear - Performed by patient: Thread/unthread right underwear leg, Thread/unthread left underwear leg, Pull underwear up/down Pants- Performed by patient: Thread/unthread right pants leg, Thread/unthread left pants leg, Pull pants up/down Non-skid slipper socks- Performed by patient: Don/doff right sock, Don/doff left sock Shoes - Performed by patient: Don/doff right shoe, Don/doff left shoe Assist for footwear: Supervision/touching assist Assist for lower body dressing: Supervision or verbal cues  Function - Toileting Toileting steps completed by patient: Adjust clothing prior to toileting, Performs perineal hygiene, Adjust clothing after toileting Toileting steps completed by helper: Adjust clothing prior to toileting, Adjust clothing after toileting(per Gaston Islam, NT report) Toileting Assistive Devices: Grab bar or rail Assist level: Supervision or verbal cues  Function - Air cabin crew transfer assistive device: Walker, Elevated toilet seat/BSC over toilet, Grab bar Assist level to toilet: Supervision or verbal cues Assist level from toilet: Supervision or verbal cues  Function - Chair/bed transfer Chair/bed transfer method: Stand pivot, Ambulatory Chair/bed transfer assist level: Supervision or verbal cues Chair/bed transfer assistive device: Armrests, Walker Chair/bed transfer details: Verbal cues for precautions/safety, Verbal cues for safe use of DME/AE  Function - Locomotion: Wheelchair Will patient use wheelchair at discharge?: No Function - Locomotion: Ambulation Assistive device: Walker-rolling Max distance: >150' Assist level: Supervision or  verbal cues Assist level: Supervision or verbal cues Walk 50 feet with 2 turns activity did not occur: Safety/medical concerns(per report) Assist level: Supervision or verbal cues Walk 150 feet activity did not occur: Safety/medical concerns(per report) Assist level: Supervision or verbal cues Assist level: Supervision or verbal  cues  Function - Comprehension Comprehension: Auditory Comprehension assist level: Follows basic conversation/direction with no assist  Function - Expression Expression: Verbal Expression assist level: Expresses basic needs/ideas: With no assist  Function - Social Interaction Social Interaction assist level: Interacts appropriately 90% of the time - Needs monitoring or encouragement for participation or interaction.  Function - Problem Solving Problem solving assist level: Solves basic problems with no assist  Function - Memory Memory assist level: Recognizes or recalls 90% of the time/requires cueing < 10% of the time Patient normally able to recall (first 3 days only): Staff names and faces, That he or she is in a hospital, Current season, Location of own room  Medical Problem List and Plan: 1. Gait disorder and decline in ADLs secondary to Communicating hydrocephalus  Cont CIR, plan for DC tomorrow  Plan to DC staples tomorrow 2. DVT Prophylaxis/Anticoagulation: Mechanical:Sequential compression devices, below kneeBilateral lower extremities 3. Pain Management:Continue Norco prn for HA/pain 4. Mood:LCSW to follow for evaluation and support. 5. Neuropsych: This patientiscapable of making decisions onherown behalf.  SLP evaluation ordered - likely higher-level 6. Skin/Wound Care:routine pressure relief measures 7. Fluids/Electrolytes/Nutrition:monitor I/O.   8. Constipation:   Cont colace BID, increased Miralax to BID  improved 9. Acute blood loss anemia  Hemoglobin 11.7 on 6/23  Cont to monitor 10. Blood pressure  Controlled on  6/28  LOS (Days) 6 A FACE TO FACE EVALUATION WAS PERFORMED  Maisa Bedingfield Lorie Phenix 07/13/2017, 12:58 PM

## 2017-07-14 ENCOUNTER — Encounter (HOSPITAL_COMMUNITY): Payer: Self-pay | Admitting: *Deleted

## 2017-07-14 NOTE — Progress Notes (Signed)
Pt. Got d/c papers and equipment.Amy Pt. Was consulted about toilet chair,as per therapy is not necessary.Pt. Ready to go home with her family.Staples on head and abdomen were removed successfully.

## 2017-07-14 NOTE — Progress Notes (Signed)
Subjective/Complaints: Patient seen lying in bed this morning. She states she slept well overnight. She is ready for discharge today. She is appreciative of her care.  ROS: denies CP, SOB, NVD  Objective: Vital Signs: Blood pressure 132/64, pulse 67, temperature 97.8 F (36.6 C), temperature source Oral, resp. rate 18, height 5' 5"  (1.651 m), weight 56.3 kg (124 lb 1.9 oz), SpO2 94 %. No results found. No results found for this or any previous visit (from the past 72 hour(s)).   Constitutional: No distress . Vital signs reviewed. HENT: Normocephalic.  Atraumatic. Eyes: EOMI. No discharge. Cardiovascular: RRR. No JVD. Respiratory: CTA Bilaterally. Normal effort. GI: BS +. Non-distended. Musc: No edema or tenderness in extremities. Neuro: Alert and Oriented Motor: 4+/5 grossly throughout  Skin:   Incisions C/D/I Psych: Normal mood and behavior.  Assessment/Plan: 1. 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 Physiatrist is providing close team supervision and 24 hour management of active medical problems listed below. Physiatrist and rehab team continue to assess barriers to discharge/monitor patient progress toward functional and medical goals. FIM: Function - Bathing Position: Shower Body parts bathed by patient: Right arm, Left arm, Chest, Abdomen, Front perineal area, Buttocks, Right upper leg, Left upper leg, Right lower leg, Left lower leg, Back Body parts bathed by helper: Back Assist Level: Supervision or verbal cues  Function- Upper Body Dressing/Undressing What is the patient wearing?: Bra, Pull over shirt/dress Bra - Perfomed by patient: Thread/unthread right bra strap, Thread/unthread left bra strap, Hook/unhook bra (pull down sports bra) Bra - Perfomed by helper: Hook/unhook bra (pull down sports bra) Pull over shirt/dress - Perfomed by patient:  Thread/unthread right sleeve, Thread/unthread left sleeve, Put head through opening, Pull shirt over trunk Assist Level: More than reasonable time Function - Lower Body Dressing/Undressing What is the patient wearing?: Underwear, Pants, Shoes Position: Wheelchair/chair at sink Underwear - Performed by patient: Thread/unthread right underwear leg, Thread/unthread left underwear leg, Pull underwear up/down Pants- Performed by patient: Thread/unthread right pants leg, Thread/unthread left pants leg, Pull pants up/down Non-skid slipper socks- Performed by patient: Don/doff right sock, Don/doff left sock Shoes - Performed by patient: Don/doff right shoe, Don/doff left shoe Assist for footwear: Supervision/touching assist Assist for lower body dressing: Supervision or verbal cues  Function - Toileting Toileting steps completed by patient: Adjust clothing prior to toileting, Performs perineal hygiene, Adjust clothing after toileting Toileting steps completed by helper: Adjust clothing prior to toileting, Adjust clothing after toileting(per Gaston Islam, NT report) Toileting Assistive Devices: Grab bar or rail Assist level: Supervision or verbal cues  Function - Air cabin crew transfer assistive device: Walker, Elevated toilet seat/BSC over toilet, Grab bar Assist level to toilet: Supervision or verbal cues Assist level from toilet: Supervision or verbal cues  Function - Chair/bed transfer Chair/bed transfer method: Stand pivot, Ambulatory Chair/bed transfer assist level: Supervision or verbal cues Chair/bed transfer assistive device: Armrests, Walker Chair/bed transfer details: Verbal cues for precautions/safety, Verbal cues for safe use of DME/AE  Function - Locomotion: Wheelchair Will patient use wheelchair at discharge?: No Function - Locomotion: Ambulation Assistive device: Walker-rolling Max distance: >150' Assist level: Supervision or verbal cues Assist level: Supervision  or verbal cues Walk 50 feet with 2 turns activity did not occur: Safety/medical concerns(per report) Assist level: Supervision or verbal cues Walk 150 feet activity did not occur: Safety/medical concerns(per report) Assist level: Supervision or verbal cues Assist level: Supervision or verbal cues  Function - Comprehension Comprehension: Auditory Comprehension assist level: Follows basic conversation/direction with no assist  Function - Expression Expression: Verbal Expression assist level: Expresses basic needs/ideas: With no assist  Function - Social Interaction Social Interaction assist level: Interacts appropriately 90% of the time - Needs monitoring or encouragement for participation or interaction.  Function - Problem Solving Problem solving assist level: Solves basic problems with no assist  Function - Memory Memory assist level: Recognizes or recalls 90% of the time/requires cueing < 10% of the time Patient normally able to recall (first 3 days only): Staff names and faces, That he or she is in a hospital, Current season, Location of own room  Medical Problem List and Plan: 1. Gait disorder and decline in ADLs secondary to Communicating hydrocephalus  D/c today  Dr. Read Drivers to see patient in 1 month for hospital follow-up  DC staples  2. DVT Prophylaxis/Anticoagulation: Mechanical:Sequential compression devices, below kneeBilateral lower extremities 3. Pain Management:Continue Norco prn for HA/pain 4. Mood:LCSW to follow for evaluation and support. 5. Neuropsych: This patientiscapable of making decisions onherown behalf. 6. Skin/Wound Care:routine pressure relief measures 7. Fluids/Electrolytes/Nutrition:monitor I/O.   8. Constipation:   Cont colace BID, increased Miralax to BID  resolved 9. Acute blood loss anemia  Hemoglobin 11.7 on 6/23  Cont to monitor 10. Blood pressure  Controlled on 6/29  LOS (Days) 7 A FACE TO FACE EVALUATION WAS  PERFORMED  Ankit Lorie Phenix 07/14/2017, 7:55 AM

## 2017-07-16 ENCOUNTER — Other Ambulatory Visit: Payer: Self-pay | Admitting: *Deleted

## 2017-07-16 DIAGNOSIS — K900 Celiac disease: Secondary | ICD-10-CM | POA: Diagnosis not present

## 2017-07-16 DIAGNOSIS — G910 Communicating hydrocephalus: Secondary | ICD-10-CM | POA: Diagnosis not present

## 2017-07-16 DIAGNOSIS — M810 Age-related osteoporosis without current pathological fracture: Secondary | ICD-10-CM | POA: Diagnosis not present

## 2017-07-16 DIAGNOSIS — Z48811 Encounter for surgical aftercare following surgery on the nervous system: Secondary | ICD-10-CM | POA: Diagnosis not present

## 2017-07-16 DIAGNOSIS — Z87891 Personal history of nicotine dependence: Secondary | ICD-10-CM | POA: Diagnosis not present

## 2017-07-16 DIAGNOSIS — Z85118 Personal history of other malignant neoplasm of bronchus and lung: Secondary | ICD-10-CM | POA: Diagnosis not present

## 2017-07-16 DIAGNOSIS — Z982 Presence of cerebrospinal fluid drainage device: Secondary | ICD-10-CM | POA: Diagnosis not present

## 2017-07-16 DIAGNOSIS — E785 Hyperlipidemia, unspecified: Secondary | ICD-10-CM | POA: Diagnosis not present

## 2017-07-16 DIAGNOSIS — R269 Unspecified abnormalities of gait and mobility: Secondary | ICD-10-CM | POA: Diagnosis not present

## 2017-07-16 NOTE — Discharge Summary (Signed)
Physician Discharge Summary  Patient ID: Krista Evans MRN: [id number] DOB/AGE: May 10, 1945 72 y.o.  Admit date: 07/07/2017 Discharge date: 07/14/2017  Discharge Diagnoses:  Principal Problem:   Communicating hydrocephalus Active Problems:   Neurologic gait disorder   Acute blood loss anemia   Slow transit constipation   Vascular headache   Labile blood pressure   Cognitive deficits   Discharged Condition: stable   Significant Diagnostic Studies: Ct Head Wo Contrast  Result Date: 07/04/2017 CLINICAL DATA:  72 y/o  F; follow-up post shunt. EXAM: CT HEAD WITHOUT CONTRAST TECHNIQUE: Contiguous axial images were obtained from the base of the skull through the vertex without intravenous contrast. COMPARISON:  03/17/2017 MRI of the head. FINDINGS: Brain: Interval placement of a right parietal approach ventriculostomy catheter with tip in the frontal horn of right lateral ventricle abutting the septum pellucidum. Stable ventricle size. Trace volume of pneumocephalus overlying right frontal lobe. No acute hemorrhage, stroke, or mass effect. Stable chronic microvascular ischemic changes and parenchymal volume loss of the brain. Stable right frontal convexity meningioma without mass effect. Vascular: Calcific atherosclerosis of the carotid siphons. Skull: Right parietal burr hole postsurgical changes and postsurgical changes in the scalp along the course of the ventriculostomy catheter. Sinuses/Orbits: No acute finding. Other: None. IMPRESSION: 1. Postsurgical changes related to right parietal burr hole and parietal approach ventriculostomy catheter. Trace pneumocephalus overlying right frontal lobe. 2. No acute abnormality identified. 3. Stable ventricle size, chronic microvascular ischemic changes, and parenchymal volume loss of the brain. Electronically Signed   By: Kristine Garbe M.D.   On: 07/04/2017 05:23    Labs:  Basic Metabolic Panel: BMP Latest Ref Rng & Units 07/08/2017  06/27/2017 04/19/2016  Glucose 65 - 99 mg/dL 97 90 99  BUN 6 - 20 mg/dL 22(H) 13 20  Creatinine 0.44 - 1.00 mg/dL 0.87 0.77 1.15(H)  Sodium 135 - 145 mmol/L 136 140 135  Potassium 3.5 - 5.1 mmol/L 4.1 3.8 3.8  Chloride 101 - 111 mmol/L 102 106 105  CO2 22 - 32 mmol/L 26 24 25   Calcium 8.9 - 10.3 mg/dL 9.1 9.4 8.8(L)    CBC: CBC Latest Ref Rng & Units 07/08/2017 06/27/2017 04/19/2016  WBC 4.0 - 10.5 K/uL 7.1 6.1 7.3  Hemoglobin 12.0 - 15.0 g/dL 11.7(L) 11.4(L) 10.5(L)  Hematocrit 36.0 - 46.0 % 36.3 35.0(L) 30.8(L)  Platelets 150 - 400 K/uL 280 257 181    CBG: No results for input(s): GLUCAP in the last 168 hours.  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  She was admitted on 07/03/2017 for placement of VPS by Dr. Arnoldo Morale.  Therapy evaluations done revealing weakness with balance deficits as well as cognitive deficits affecting ADLs and mobility.  CIR was recommended for follow-up therapy.   Hospital Course: Krista Evans was admitted to rehab 07/07/2017 for inpatient therapies to consist of PT and OT at least three hours five days a week. Past admission physiatrist, therapy team and rehab RN have worked together to provide customized collaborative inpatient rehab.  Blood pressures have been controlled and she has been afebrile during her stay. Follow up CBC showed that H/H is improving and renal status is stable. Bowel program was augmented to help manage constipation. She is continent of bowel and bladder. Crani incision has healed well without signs of infection and staples  were removed without difficulty on 6/29 piror to discharge.  She has made good progress and supervision was recommended due to safety/impulsivity with rollater use. She will continue to receive follow up HHPT and [place] by [place] after discharge.     Rehab course: During patient's stay in rehab weekly team conferences were held to monitor patient's progress, set goals and discuss barriers to discharge. At admission, patient required min assist with mobility and self care tasks. She  has had improvement in activity tolerance, balance, postural control as well as ability to compensate for deficits. She is able to complete ADL tasks with supervision.  She is independent for transfers and requires supervision for ambulating 150' with RW. Multiple attempts to get husband in for education were unsuccessful.  He was instructed on providing supervision after discharge.   Disposition: Home   Diet: Regular.   Special Instructions: 1. No Driving. 2. No Aspirin products till follow up with Dr. Arnoldo Morale.    Discharge Instructions    Ambulatory referral to Physical Medicine Rehab   Complete by:  As directed    Moderate complexity follow-up 1 to 2 weeks communicating hydrocephalus     Allergies as of 07/14/2017      Reactions   Barley Grass Other (See Comments)   And rye Doesn't absorb in small intestines   Gluten Meal Other (See Comments)   Cant absorb in small intestines    Wheat Bran Other (See Comments)   Doesn't absorb in small intestines      Medication List    STOP taking these medications   aspirin EC 81 MG tablet     TAKE these medications   calcium carbonate 500 MG chewable tablet Commonly known as:  TUMS - dosed in mg elemental calcium Chew 2 tablets by mouth daily.   docusate sodium 100 MG capsule Commonly known as:  COLACE Take 1 capsule (100 mg total) by mouth 2 (two) times daily.   HYDROcodone-acetaminophen 5-325 MG tablet--Rx #30 pills Commonly known as:  NORCO/VICODIN Take 1 tablet by mouth every 6 (six) hours as needed for severe pain.   MULTI-VITAMINS Tabs Take 1 tablet by mouth daily.   Vitamin D 2000 units tablet Take 2,000 Units by mouth daily.      Follow-up Information    Kirsteins, Luanna Salk,  MD Follow up.   Specialty:  Physical Medicine and Rehabilitation Why:  Office to call for appointment Contact information: [place] [address] [phone]        Adin Hector, MD Follow up on 07/20/2017.   Specialty:  Internal Medicine Why:  Appointment @ 8:45 AM Contact information: [place] [address] [phone]        Newman Pies, MD Follow up.   Specialty:  Neurosurgery Why:  Call for appointment Contact information: [address] [place] [address] [phone]           Signed: Bary Leriche 07/16/2017, 10:56 AM

## 2017-07-16 NOTE — Patient Outreach (Signed)
[facility]) Care Management  07/16/2017  Krista Evans [birth year] [id number]   Transition of Care Referral   Referral Date:  07/13/17 Referral Source: UM referral HTA referral  Date of Admission: 07/07/17 Diagnosis: communicating hydrocephalus neurologic gait disorder, acute blood loss anemia, slow transit constipation, vascular headache, labile blood pressure, cognitive deficits  Date of Discharge: 07/14/17 Facility:  [facility] Insurance:   Outreach attempt # 1 No answer. [facility] RN CM left HIPAA compliant voicemail message along with CM's contact info.  D/c home with Home Health: [facility] and per EPIC IPR notes     Plan: [facility] RN CM sent an unsuccessful outreach letter and scheduled this patient for another call attempt within 4 business days  Kimberly L. Lavina Hamman, RN, BSN, CCM [facility] Telephonic Care Management Care Coordinator Direct number [phone]  Main [facility] number [phone] Fax number [phone]

## 2017-07-17 ENCOUNTER — Ambulatory Visit: Payer: Self-pay | Admitting: *Deleted

## 2017-07-17 ENCOUNTER — Other Ambulatory Visit: Payer: Self-pay

## 2017-07-17 ENCOUNTER — Telehealth: Payer: Self-pay

## 2017-07-17 NOTE — Patient Outreach (Signed)
[facility]) Care Management  07/17/2017  LUIS SAMI 02-16-1945 [id number]  Referral Date:  07/13/17 Referral Source: UM referral HTA referral  Date of Admission: 07/07/17 Diagnosis: communicating hydrocephalus neurologic gait disorder, acute blood loss anemia, slow transit constipation, vascular headache, labile blood pressure, cognitive deficits  Date of Discharge: 07/14/17 Facility:  [facility] Attempt #2  Telephone call to patient regarding utilization management referral. Unable to reach patient. HIPAA compliant voice message left with call back phone number.   PLAN; RNCM will attempt 2nd telephone call to patient within 4 business days.   Quinn Plowman RN,BSN,CCM [facility] Telephonic  [phone]

## 2017-07-17 NOTE — Telephone Encounter (Signed)
error 

## 2017-07-20 DIAGNOSIS — D649 Anemia, unspecified: Secondary | ICD-10-CM | POA: Diagnosis not present

## 2017-07-20 DIAGNOSIS — G912 (Idiopathic) normal pressure hydrocephalus: Secondary | ICD-10-CM | POA: Diagnosis not present

## 2017-07-20 DIAGNOSIS — I700 Atherosclerosis of aorta: Secondary | ICD-10-CM | POA: Diagnosis not present

## 2017-07-20 DIAGNOSIS — D329 Benign neoplasm of meninges, unspecified: Secondary | ICD-10-CM | POA: Diagnosis not present

## 2017-07-20 DIAGNOSIS — K900 Celiac disease: Secondary | ICD-10-CM | POA: Diagnosis not present

## 2017-07-20 DIAGNOSIS — M810 Age-related osteoporosis without current pathological fracture: Secondary | ICD-10-CM | POA: Diagnosis not present

## 2017-07-20 DIAGNOSIS — Z85118 Personal history of other malignant neoplasm of bronchus and lung: Secondary | ICD-10-CM | POA: Diagnosis not present

## 2017-07-20 DIAGNOSIS — E7849 Other hyperlipidemia: Secondary | ICD-10-CM | POA: Diagnosis not present

## 2017-07-23 ENCOUNTER — Other Ambulatory Visit: Payer: Self-pay

## 2017-07-23 NOTE — Patient Outreach (Signed)
[facility]) Care Management  07/23/2017  Krista Evans [facility] 1945-04-12 [id number]  Referral Date:07/13/17 Referral Source:UM referral HTA referral Date of Admission:07/07/17 Diagnosis:communicating hydrocephalus neurologic gait disorder, acute blood loss anemia, slow transit constipation, vascular headache, labile blood pressure, cognitive deficits Date of Discharge:07/14/17 Facility:[facility]  Telephone call to patient regarding transition of care follow up. HIPAA verified with patient. Explained reason for call. Patient states she was in the hospital to have a shunt placed in her brain to drain off spinal fluid. Patient reports she saw her primary MD on 07/20/17 and has seen the surgeon. Patient reports her next follow up with both doctors is in 3 months. Patient states she has transportation to her appointments.  Patient states her incision is healing well. RNCM discussed sign/symptoms of infection. Advised to call doctor for symptoms.  Patient denies having any additional symptoms.  Patient reports she has  her medications and takes them  as prescribed. Patient reports [facility] is providing occupational and physical therapy.  RNCM discussed and offered ongoing transition of care follow up. Patient declined.  RNCM advised patient to notify MD of any changes in condition prior to scheduled appointment. RNCM provided contact name and number: [phone] or main office number [phone] and 24 hour nurse advise line [phone].  RNCM verified patient aware of 911 services for urgent/ emergent needs.   PLAN; RNCM will close patient due to refusal of services.  RNCM will send closure notification to patients primary MD.  [facility] will send patient [facility] care management   Quinn Plowman RN,BSN,CCM [facility] Telephonic  [phone]

## 2017-07-24 ENCOUNTER — Encounter: Payer: PPO | Admitting: Physical Medicine & Rehabilitation

## 2017-07-30 DIAGNOSIS — Z48811 Encounter for surgical aftercare following surgery on the nervous system: Secondary | ICD-10-CM | POA: Diagnosis not present

## 2017-07-30 DIAGNOSIS — M810 Age-related osteoporosis without current pathological fracture: Secondary | ICD-10-CM | POA: Diagnosis not present

## 2017-07-30 DIAGNOSIS — Z982 Presence of cerebrospinal fluid drainage device: Secondary | ICD-10-CM | POA: Diagnosis not present

## 2017-07-30 DIAGNOSIS — G910 Communicating hydrocephalus: Secondary | ICD-10-CM | POA: Diagnosis not present

## 2017-07-30 DIAGNOSIS — Z87891 Personal history of nicotine dependence: Secondary | ICD-10-CM | POA: Diagnosis not present

## 2017-07-30 DIAGNOSIS — K5900 Constipation, unspecified: Secondary | ICD-10-CM | POA: Diagnosis not present

## 2017-07-30 DIAGNOSIS — R269 Unspecified abnormalities of gait and mobility: Secondary | ICD-10-CM | POA: Diagnosis not present

## 2017-07-30 DIAGNOSIS — Z85118 Personal history of other malignant neoplasm of bronchus and lung: Secondary | ICD-10-CM | POA: Diagnosis not present

## 2017-09-03 ENCOUNTER — Ambulatory Visit: Payer: PPO | Attending: Internal Medicine

## 2017-09-11 ENCOUNTER — Ambulatory Visit: Payer: PPO

## 2017-09-19 ENCOUNTER — Ambulatory Visit: Payer: PPO

## 2017-10-23 ENCOUNTER — Other Ambulatory Visit: Payer: Self-pay

## 2017-10-23 ENCOUNTER — Ambulatory Visit: Payer: PPO | Attending: Internal Medicine

## 2017-10-23 DIAGNOSIS — M6281 Muscle weakness (generalized): Secondary | ICD-10-CM | POA: Diagnosis not present

## 2017-10-23 DIAGNOSIS — Z23 Encounter for immunization: Secondary | ICD-10-CM | POA: Diagnosis not present

## 2017-10-23 DIAGNOSIS — R2681 Unsteadiness on feet: Secondary | ICD-10-CM | POA: Insufficient documentation

## 2017-10-23 DIAGNOSIS — R269 Unspecified abnormalities of gait and mobility: Secondary | ICD-10-CM | POA: Insufficient documentation

## 2017-10-23 NOTE — Therapy (Addendum)
[place] [facility] Main SERVICES [address] [place], [place], [address] Phone: [phone]   Fax:  [phone]  Physical Therapy Evaluation  Patient Details  Name: Krista Evans MRN: [id number] Date of Birth: September 19, 1945 Referring Provider (PT): Ramonita Lab   Encounter Date: 10/23/2017  PT End of Session - 10/23/17 1258    Visit Number  1    Number of Visits  17    Date for PT Re-Evaluation  12/18/17    Authorization Type  1/10 PN begain 10/23/17    PT Start Time  1015    PT Stop Time  1113    PT Time Calculation (min)  58 min    Equipment Utilized During Treatment  Gait belt    Activity Tolerance  Patient tolerated treatment well    Behavior During Therapy  WFL for tasks assessed/performed       Past Medical History:  Diagnosis Date  . Abnormal Q waves on electrocardiogram   . Anemia   . Aortic atherosclerosis ([place])    "I could possibly have it, my grandmother had it"  . Cancer [facility]) 05/2011   Right upper Lung CA with partial Lobectomy.  . Celiac disease   . Celiac disease   . Dyspnea    with exertion  . Hyperlipidemia   . Hyperlipidemia   . Lung mass   . Meningioma ([place])   . Osteoporosis     Past Surgical History:  Procedure Laterality Date  . LUNG LOBECTOMY     right lung  . 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   Subjective Assessment - 10/23/17 1237    Subjective  Patient is a pleasant 72 year old female. She denies pain and only achiness in LLE when she walks. States she has not fallen in over a year but stumbled this morning when standing up.     Pertinent History  Patient stated she received therapy here about a year ago and went home and did not do a lot of exercise. Patient reports around June 2019 she had an MRI  which showed enlarged ventricles and hydrocephalus and shunt was placed. Spent about a week and half in the hospital where she also received therapy. Also received home health PT and OT for about 3 weeks when discharged from hospital but has not done her exercises since. States she is not aware of any shunt precautions but feels the shunt which is described as a sharp pain when she lays late and leans forward. Reports her doctor is currently unaware of these symptoms. Patient states she walks around the house daily and 1x week she walks down the street and back with husband. Patient reports L leg aching after she begins walking to bedroom and back with rollator. Reports this has been happening for a couple years. Patient would like to get stronger, improve balance, and become more independent. Currently requires assistance getting rollator in and out of car.      Limitations  Lifting;Standing;Walking;House hold activities    How long can you sit comfortably?  n/a    How long can you stand comfortably?  5 minutes    How long can you walk comfortably?  limited but patient unsure of duration     Patient Stated Goals  improve balance, strength, be more independent  Currently in Pain?  No/denies     Vitals: 147/38mHg 74bpm  PAIN: Denies pain at rest   STRENGTH:  Graded on a 0-5 scale Muscle Group Left Right  Hip Flex 3+/5 4-/5  Hip Abd 4/5 4/5  Hip Add 4/5 4-/5  Knee Flex 4-/5 4-/5  Knee Ext 4/5 4/5  Ankle DF 4/5 5/5  Ankle PF 4/5 5/5   SENSATION: Sensation intact   FUNCTIONAL MOBILITY: 5 times sit to stand- 26 seconds with use of UEs   BALANCE/Coodination: BERG- 37/56 Finger to nose- WNL Heel to shin- decreased coordination with LLE    Static Sitting Balance  Normal Able to maintain balance against maximal resistance   Good Able to maintain balance against moderate resistance   Good-/Fair+ Accepts minimal resistance x  Fair Able to sit unsupported without balance loss and  without UE support   Poor+ Able to maintain with Minimal assistance from individual or chair   Poor Unable to maintain balance-requires mod/max support from individual or chair    Static Standing Balance  Normal Able to maintain standing balance against maximal resistance   Good Able to maintain standing balance against moderate resistance   Good-/Fair+ Able to maintain standing balance against minimal resistance x  Fair Able to stand unsupported without UE support and without LOB for 1-2 min   Fair- Requires Min A and UE support to maintain standing without loss of balance   Poor+ Requires mod A and UE support to maintain standing without loss of balance   Poor Requires max A and UE support to maintain standing balance without loss    Dynamic Sitting Balance  Normal Able to sit unsupported and weight shift across midline maximally   Good Able to sit unsupported and weight shift across midline moderately   Good-/Fair+ Able to sit unsupported and weight shift across midline minimally   Fair Minimal weight shifting ipsilateral/front, difficulty crossing midline x  Fair- Reach to ipsilateral side and unable to weight shift   Poor + Able to sit unsupported with min A and reach to ipsilateral side, unable to weight shift   Poor Able to sit unsupported with mod A and reach ipsilateral/front-can't cross midline    Standing Dynamic Balance  Normal Stand independently unsupported, able to weight shift and cross midline maximally   Good Stand independently unsupported, able to weight shift and cross midline moderately   Good-/Fair+ Stand independently unsupported, able to weight shift across midline minimally   Fair Stand independently unsupported, weight shift, and reach ipsilaterally, loss of balance when crossing midline x  Poor+ Able to stand with Min A and reach ipsilaterally, unable to weight shift   Poor Able to stand with Mod A and minimally reach ipsilaterally, unable to cross midline.     GAIT: Ambulates with rollator- Sway and inability to maintain straight gait pattern  634m-845ft with rollator- onset of LLE aching during first 10080ff 6mw32mRelieves with rest   10mw84m0seconds; = 19m/s 22mh rollator  OUTCOME MEASURES: TEST Outcome Interpretation  5 times sit<>stand 26 sec with BUE use >60 yo, >15 sec indicates increased risk for falls  10 meter walk test            1.0     M/s with rollator <1.0 m/s indicates increased risk for falls; limited community ambulator  6 minute walk test      845          Feet 1000 feet is community ambulaWater quality scientist  37/56  37-45 (80% risk for falls);   LEFS-45/80  This patient presents with  3, personal factors/ comorbidities osteoporosis, ventriculoperitoneal shunt, and dyspnea with exertion, and 4  body elements including body structures and functions, activity limitations and or participation restrictions. Patient's condition is evolving.       [facility] PT Assessment - 10/23/17 1034      Assessment   Medical Diagnosis  L sided weakness     Referring Provider (PT)  Ramonita Lab    Onset Date/Surgical Date  06/16/17    Hand Dominance  Left    Next MD Visit  November 5   Outpatient discharged 01/19; inpatient and home health 7/19   Prior Therapy  Had multiple bouts of therapy including SNF, home health and outpatient PT; Finished outpatient in August 2018 for this condition;       Precautions   Precautions  None      Balance Screen   Has the patient fallen in the past 6 months  No    How many times?  0    Has the patient had a decrease in activity level because of a fear of falling?   No    Is the patient reluctant to leave their home because of a fear of falling?   No      Home Film/video editor residence    Living Arrangements  Spouse/significant other    Available Help at Discharge  Family    Type of [place] to enter    Entrance Stairs-Number of Steps  [place]  One level    [place] - standard;[place] - single point;Other (comment)   rollator     Prior Function   Level of Independence  Independent with basic ADLs;Independent with household mobility with device   needs assistance with getting in and out of shower   Vocation  Retired    Leisure  play games on phone      Cognition   Overall Cognitive Status  Within Functional Limits for tasks assessed      [place] to [place] to stand  independently using hands    Standing Unsupported  Able to stand safely 2 minutes    Sitting with Back Unsupported but Feet Supported on Floor or Stool  Able to sit safely and securely 2 minutes    Stand to Sit  Controls descent by using hands    Transfers  Able to transfer safely, definite need of hands    Standing Unsupported with Eyes Closed  Able to stand 10 seconds safely    Standing Ubsupported with Feet Together  Able to place feet together independently and stand 1 minute safely    From Standing, Reach Forward with Outstretched Arm  Can reach forward >12 cm safely (5")    From Standing Position, Pick up Object from [place] to pick up shoe safely and easily    From Standing Position, Turn to Look Behind Over each Shoulder  Looks behind from both sides and weight shifts well    Turn 360 Degrees  Needs close supervision or verbal cueing    Standing Unsupported, Alternately Place Feet on Step/Stool  Needs assistance to keep from falling or unable to try    Standing Unsupported, One Foot in [place] while  stepping or standing    Standing on One Leg  Unable to try or needs assist to prevent fall    Total Score  37                Objective measurements completed on examination: See above findings.              PT Education - 10/23/17 1239    Education provided  Yes    Education Details  communication with doctor, goals    Person(s) Educated   Patient    Methods  Explanation;Verbal cues    Comprehension  Verbalized understanding;Returned demonstration;Need further instruction       PT Short Term Goals - 10/23/17 1313      PT SHORT TERM GOAL #1   Title  Patient will be independent in HEP demonstrating successful between session carryover and improve functional mobility and ease of completing ADLs.    Baseline  10/8: HEP given next session    Time  2    Period  Weeks    Status  New    Target Date  11/06/17      PT SHORT TERM GOAL #2   Title  Patient will be able to transfer sit<>Stand from regular height chair without pushing on arm rests to exhibit improved independence improved functional strength.     Baseline  10/8: BUE support     Time  2    Period  Weeks    Status  New    Target Date  11/06/17        PT Long Term Goals - 10/23/17 1316      PT LONG TERM GOAL #1   Title  Patient will ambulate >102f with rollator during 619m demonstrating improved community ambulation and ease with shopping    Baseline  10/8: 84513f  Time  8    Period  Weeks    Status  New    Target Date  12/18/17      PT LONG TERM GOAL #2   Title  Patient (> 60 24ars old) will complete five times sit to stand test in < 15 seconds indicating an increased LE strength and improved balance.    Baseline  10/8: 26seconds with BUE    Time  8    Period  Weeks    Status  New    Target Date  12/18/17      PT LONG TERM GOAL #3   Title  Patient will demonstrate an improved Berg Balance Score of >42/56 as to demonstrate improved balance with ADLs such as sitting/standing and transfer balance and reduced fall risk.     Baseline  10/8: 37/56    Time  8    Period  Weeks    Status  New    Target Date  12/18/17      PT LONG TERM GOAL #4   Title  Patient will improve LEFS to 54/80 to demonstrate improved fucnctional strenth and mobility and ease of indepedence    Baseline  10/8: 45/80    Time  8    Period  Weeks    Status  New    Target Date   12/18/17      PT LONG TERM GOAL #5   Title  Patient will be able to load and unload rollator indepedently to improve independence and quality of life.     Baseline  10/8: needs assistance to put rollator in and out of car  Time  8    Period  Weeks    Status  New    Target Date  12/18/17             Plan - 10/23/17 1304    Clinical Impression Statement  Patient presents to therapy with L sided weakness and decreased balance. Patient scored 37/56 on BERG balance assessment demonstrating possible fall risk. Patient requires use of rollater for ambulation and has limited dynamic and static stability potentially secondary to bilateral weakness.  Patient requires use of UEs for sit to stands and completed in 26 seconds demonstrating decreased functional strength and increased fall risk. Patient educated about following up with doctor regarding shunt symptoms. Patient will benefit from skilled physical therapy services to improve strength, balance, and improve independence to aid in improving quality of life.     History and Personal Factors relevant to plan of care:  This patient presents with  3, personal factors/ comorbidities osteoporosis, ventriculoperitoneal shunt, and dyspnea with exertion, and 4  body elements including body structures and functions, activity limitations and or participation restrictions. Patient's condition is evolving.     Clinical Presentation  Evolving    Clinical Presentation due to:  fall risk, L sided weakness, shunt    Clinical Decision Making  Moderate    Rehab Potential  Good    Clinical Impairments Affecting Rehab Potential  (+) improvement made within last year, eagerness to participate (-) HEP compliance, safety awareness     PT Frequency  2x / week    PT Duration  8 weeks    PT Treatment/Interventions  ADLs/Self Care Home Management;Cryotherapy;Electrical Stimulation;Iontophoresis 84m/ml Dexamethasone;Moist Heat;Ultrasound;DME Instruction;Gait  training;Stair training;Balance training;Therapeutic exercise;Therapeutic activities;Functional mobility training;Neuromuscular re-education;Patient/family education;Manual techniques;Passive range of motion;Energy conservation    PT Next Visit Plan  give HEP    PT Home Exercise Plan  next session    Consulted and Agree with Plan of Care  Patient       Patient will benefit from skilled therapeutic intervention in order to improve the following deficits and impairments:  Abnormal gait, Decreased activity tolerance, Decreased balance, Decreased coordination, Decreased endurance, Decreased mobility, Decreased range of motion, Decreased safety awareness, Decreased strength, Difficulty walking, Impaired flexibility, Impaired perceived functional ability, Improper body mechanics, Postural dysfunction, Pain, Impaired UE functional use, Decreased knowledge of precautions  Visit Diagnosis: Unsteadiness on feet  Muscle weakness (generalized)     Problem List Patient Active Problem List   Diagnosis Date Noted  . Cognitive deficits   . Labile blood pressure   . Acute blood loss anemia   . Slow transit constipation   . Vascular headache   . 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 Hydrocephalus ([place] 07/03/2017  . 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 Pelvic fracture ([place] 04/18/2016  . CD (celiac disease) 08/04/2014  . [place] of [place] of right [place] 08/04/2014  . Age related osteoporosis 11/26/2013  . Absolute anemia 05/21/2013  . H/O malignant neoplasm 05/21/2013  . HLD (hyperlipidemia) 05/21/2013   AErick Blinks SPT This entire session was performed under direct supervision and direction of a licensed therapist/therapist assistant . I have personally read, edited and approve of the note as written.  MJanna Arch PT, DPT   10/23/2017, 2:36 PM  CWelcomeMAIN RDen[facility] Surgic[facility] LLCSERVICES [address]RScott [address] [address]Phone:  [phone]  Fax:  [phone] Name: Krista MARTONMRN: [id number] of Birth: [birth year]

## 2017-10-25 ENCOUNTER — Ambulatory Visit: Payer: PPO

## 2017-10-25 DIAGNOSIS — R2681 Unsteadiness on feet: Secondary | ICD-10-CM | POA: Diagnosis not present

## 2017-10-25 DIAGNOSIS — M6281 Muscle weakness (generalized): Secondary | ICD-10-CM

## 2017-10-25 NOTE — Therapy (Addendum)
[place] MAIN [facility] SERVICES [address] [place], [place], [address] Phone: [phone]   Fax:  [phone]  Physical Therapy Treatment  Patient Details  Name: Krista Evans MRN: [id number] Date of Birth: 1945-08-05 Referring Provider (PT): Ramonita Lab   Encounter Date: 10/25/2017  PT End of Session - 10/25/17 1308    Visit Number  2    Number of Visits  17    Date for PT Re-Evaluation  12/18/17    Authorization Type  2/10 PN begain 10/23/17    PT Start Time  1023    PT Stop Time  1109    PT Time Calculation (min)  46 min    Equipment Utilized During Treatment  Gait belt    Activity Tolerance  Patient tolerated treatment well    Behavior During Therapy  WFL for tasks assessed/performed       Past Medical History:  Diagnosis Date  . Abnormal Q waves on electrocardiogram   . Anemia   . Aortic atherosclerosis ([place])    "I could possibly have it, my grandmother had it"  . Cancer [facility]) 05/2011   Right upper Lung CA with partial Lobectomy.  . Celiac disease   . Celiac disease   . Dyspnea    with exertion  . Hyperlipidemia   . Hyperlipidemia   . Lung mass   . Meningioma ([place])   . Osteoporosis     Past Surgical History:  Procedure Laterality Date  . LUNG LOBECTOMY     right lung  . 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  Subjective Assessment - 10/25/17 1307    Subjective  Patient states she is doing well. States she has not had sharp pain around shunt since last visit. Reports no falls or stumbles.     Pertinent History  Patient stated she received therapy here about a year ago and went home and did not do a lot of exercise. Patient reports around June 2019 she had an MRI which showed enlarged ventricles and hydrocephalus and shunt  was placed. Spent about a week and half in the hospital where she also received therapy. Also received home health PT and OT for about 3 weeks when discharged from hospital but has not done her exercises since. States she is not aware of any shunt precautions but feels the shunt which is described as a sharp pain when she lays late and leans forward. Reports her doctor is currently unaware of these symptoms. Patient states she walks around the house daily and 1x week she walks down the street and back with husband. Patient reports L leg aching after she begins walking to bedroom and back with rollator. Reports this has been happening for a couple years. Patient would like to get stronger, improve balance, and become more independent. Currently requires assistance getting rollator in and out of car.      Limitations  Lifting;Standing;Walking;House hold activities    How long can you sit comfortably?  n/a    How long can you stand comfortably?  5 minutes    How long can you walk comfortably?  limited but patient unsure of duration     Patient Stated Goals  improve balance, strength, be more independent     Currently in Pain?  No/denies  Vitals: BP 152/48mHg HR 72bpm   Sit to stands with BUE support. Verbal cues to increase width of feet and push through legs   In //bars: Standing step overs and back orange hurdle. x10 each leg. BUE support, CGA Verbal cues to increase step height. Increased difficulty with LLE  Standing airex pad left/right/up/down head turns x10 each direction. No UE support. CGA  Standing hip abduction x10 each leg BUE support. Verbal cues to maintain neutral LE alignment.   Standing hip extension x10 each leg BUE support. Verbal cues for upright posture  Standing toe taps x10 each leg 1UE support with RUE. 4in step. Verbal cues for appropriate weight shift   Standing tapping 3 stepping stones with each LE BUE and CGA. Focus on LLE due to decreased motor coordination.  Verbal cues to tap and turn to neutral before tapping next color x536mutes  Seated adduction isometric squeeze. x10 3 second holds. Verbal cues to increase strength of contraction on LLE  Seated abduction with RTB. x10 3 second holds. Verbal cues to maintain contraction.  Patient requires cueing for appropriate mechanics, technique, and task orientation throughout session.  HEP performed with patient demonstrating understanding: Paper copy given.   Access Code: [email or web address]  URL: [email or web address]  Date: 10/25/2017  Prepared by: MaJanna Arch Exercises  . Standing Hip Abduction with Counter Support - 10 reps - 2 sets - 5 hold - 1x daily - 7x weekly  . Standing Hip Extension with Counter Support - 10 reps - 2 sets - 5 hold - 1x daily - 7x weekly  Seated March - 10 reps - 2 sets - 5 hold - 1x daily - 7x weekly                        PT Education - 10/25/17 1308    Education provided  Yes    Education Details  exercise technique, HEP    Person(s) Educated  Patient    Methods  Explanation;Demonstration;Verbal cues;Handout    Comprehension  Verbalized understanding;Returned demonstration;Need further instruction       PT Short Term Goals - 10/23/17 1313      PT SHORT TERM GOAL #1   Title  Patient will be independent in HEP demonstrating successful between session carryover and improve functional mobility and ease of completing ADLs.    Baseline  10/8: HEP given next session    Time  2    Period  Weeks    Status  New    Target Date  11/06/17      PT SHORT TERM GOAL #2   Title  Patient will be able to transfer sit<>Stand from regular height chair without pushing on arm rests to exhibit improved independence improved functional strength.     Baseline  10/8: BUE support     Time  2    Period  Weeks    Status  New    Target Date  11/06/17        PT Long Term Goals - 10/23/17 1316      PT LONG TERM GOAL #1   Title  Patient will ambulate  >100076fith rollator during [id number] improved community ambulation and ease with shopping    Baseline  10/8: [id number]  8    Period  Weeks    Status  New    Target Date  12/18/17      PT LONG TERM GOAL #2   Title  Patient (> 29 years old) will complete five times sit to stand test in < 15 seconds indicating an increased LE strength and improved balance.    Baseline  10/8: 26seconds with BUE    Time  8    Period  Weeks    Status  New    Target Date  12/18/17      PT LONG TERM GOAL #3   Title  Patient will demonstrate an improved Berg Balance Score of >42/56 as to demonstrate improved balance with ADLs such as sitting/standing and transfer balance and reduced fall risk.     Baseline  10/8: 37/56    Time  8    Period  Weeks    Status  New    Target Date  12/18/17      PT LONG TERM GOAL #4   Title  Patient will improve LEFS to 54/80 to demonstrate improved fucnctional strenth and mobility and ease of indepedence    Baseline  10/8: 45/80    Time  8    Period  Weeks    Status  New    Target Date  12/18/17      PT LONG TERM GOAL #5   Title  Patient will be able to load and unload rollator indepedently to improve independence and quality of life.     Baseline  10/8: needs assistance to put rollator in and out of car    Time  8    Period  Weeks    Status  New    Target Date  12/18/17            Plan - 10/25/17 1316    Clinical Impression Statement  Patient was given HEP for improved strength and balance due to weakness and increased fall risk and educated about the importance of compliance. Patient has decreased motor coordination with LLE evident with standing toe taps and stepping stone exercise. Focus placed on LLE during stepping stone task and repetition for improved motor coordination of LLE. Patient will continue to benefit from skilled physical therapy to improve strength, balance, and functional mobility.     Rehab Potential  Good    Clinical  Impairments Affecting Rehab Potential  (+) improvement made within last year, eagerness to participate (-) HEP compliance, safety awareness     PT Frequency  2x / week    PT Duration  8 weeks    PT Treatment/Interventions  ADLs/Self Care Home Management;Cryotherapy;Electrical Stimulation;Iontophoresis 22m/ml Dexamethasone;Moist Heat;Ultrasound;DME Instruction;Gait training;Stair training;Balance training;Therapeutic exercise;Therapeutic activities;Functional mobility training;Neuromuscular re-education;Patient/family education;Manual techniques;Passive range of motion;Energy conservation    PT Next Visit Plan  strength, balance, motor coordination, ambulation with turns    PT Home Exercise Plan  see sheet    Consulted and Agree with Plan of Care  Patient       Patient will benefit from skilled therapeutic intervention in order to improve the following deficits and impairments:  Abnormal gait, Decreased activity tolerance, Decreased balance, Decreased coordination, Decreased endurance, Decreased mobility, Decreased range of motion, Decreased safety awareness, Decreased strength, Difficulty walking, Impaired flexibility, Impaired perceived functional ability, Improper body mechanics, Postural dysfunction, Pain, Impaired UE functional use, Decreased knowledge of precautions  Visit Diagnosis: Unsteadiness on feet  Muscle weakness (generalized)     Problem List Patient Active Problem List   Diagnosis Date Noted  . Cognitive deficits   . Labile blood pressure   . Acute blood loss anemia   . Slow transit constipation   . Vascular headache   .  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 Hydrocephalus ([place]) 07/03/2017  . 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 Pelvic fracture ([place]) 04/18/2016  . CD (celiac disease) 08/04/2014  . Cancer of upper lobe of right lung ([place]) 08/04/2014  . Age related osteoporosis 11/26/2013  . Absolute anemia 05/21/2013  . H/O malignant neoplasm 05/21/2013   . HLD (hyperlipidemia) 05/21/2013   Erick Blinks, SPT  This entire session was performed under direct supervision and direction of a licensed therapist/therapist assistant . I have personally read, edited and approve of the note as written.  Janna Arch, PT, DPT   10/25/2017, 1:28 PM  [place] MAIN [facility] SERVICES [address] [place], [place], [address] Phone: [phone]   Fax:  [phone]  Name: Krista Evans MRN: [id number] Date of Birth: [birth year]

## 2017-10-25 NOTE — Patient Instructions (Signed)
Access Code: [email or web address]  URL: [email or web address]  Date: 10/25/2017  Prepared by: Janna Arch   Exercises  . Standing Hip Abduction with Counter Support - 10 reps - 2 sets - 5 hold - 1x daily - 7x weekly  . Standing Hip Extension with Counter Support - 10 reps - 2 sets - 5 hold - 1x daily - 7x weekly  Seated March - 10 reps - 2 sets - 5 hold - 1x daily - 7x weekly

## 2017-10-29 ENCOUNTER — Ambulatory Visit: Payer: PPO

## 2017-10-29 DIAGNOSIS — R2681 Unsteadiness on feet: Secondary | ICD-10-CM | POA: Diagnosis not present

## 2017-10-29 DIAGNOSIS — M6281 Muscle weakness (generalized): Secondary | ICD-10-CM

## 2017-10-29 NOTE — Therapy (Addendum)
[place] MAIN [facility] SERVICES [address] [place], [place], [address] Phone: [phone]   Fax:  [phone]  Physical Therapy Treatment  Patient Details  Name: Krista Evans MRN: [id number] Date of Birth: [birth year] Referring Provider (PT): Ramonita Lab   Encounter Date: 10/29/2017  PT End of Session - 10/29/17 1114    Visit Number  3    Number of Visits  17    Date for PT Re-Evaluation  12/18/17    Authorization Type  3/10 PN begain 10/23/17    PT Start Time  1018    PT Stop Time  1103    PT Time Calculation ([place])  45 Evans    Equipment Utilized During Treatment  Gait belt    Activity Tolerance  Patient tolerated treatment well    Behavior During Therapy  WFL for tasks assessed/performed       Past Medical History:  Diagnosis Date  . Abnormal Q waves on electrocardiogram   . Anemia   . Aortic atherosclerosis ([place])    "I could possibly have it, my grandmother had it"  . Cancer [facility]) 05/2011   Right upper Lung CA with partial Lobectomy.  . Celiac disease   . Celiac disease   . Dyspnea    with exertion  . Hyperlipidemia   . Hyperlipidemia   . Lung mass   . Meningioma ([place])   . Osteoporosis     Past Surgical History:  Procedure Laterality Date  . LUNG LOBECTOMY     right lung  . 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  Subjective Assessment - 10/29/17 1032    Subjective  Patient states she had a good weekend. Reports HEP compliance. States she did exercises everyday 1x sometimes 2x a day.    Pertinent History  Patient stated she received therapy here about a year ago and went home and did not do a lot of exercise. Patient reports around June 2019 she had an MRI which showed enlarged ventricles and hydrocephalus and shunt was  placed. Spent about a week and half in the hospital where she also received therapy. Also received home health PT and OT for about 3 weeks when discharged from hospital but has not done her exercises since. States she is not aware of any shunt precautions but feels the shunt which is described as a sharp pain when she lays late and leans forward. Reports her doctor is currently unaware of these symptoms. Patient states she walks around the house daily and 1x week she walks down the street and back with husband. Patient reports L leg aching after she begins walking to bedroom and back with rollator. Reports this has been happening for a couple years. Patient would like to get stronger, improve balance, and become more independent. Currently requires assistance getting rollator in and out of car.      Limitations  Lifting;Standing;Walking;House hold activities    How long can you sit comfortably?  n/a    How long can you stand comfortably?  5 minutes    How long can you walk comfortably?  limited but patient unsure of duration     Patient Stated Goals  improve balance, strength, be more independent     Currently in Pain?  No/denies      BP: 149/51mHg HR 84bpm  Review HEP for appropriate technique with modifications as needed -Seated marches x10 each leg -Standing hip abduction x10 each legVerbal cues to maintain neutral LE alignment.  -Standing hip abduction x10 each leg    Sit to stands with 1 hand on knee 1 hand on chair. Verbal cues to increase width of feet and push through legs    Seated DF/PF x15 for motor coordination and foot strength  In //bars: Standing step overs and back orange hurdle. x10 each leg. BUE support, CGA Verbal cues to increase step height. Increased difficulty with LLE  Lateral step overs with orange hurdle x10 each leg BUE. CGA Verbal cues to increase step height.    Standing airex pad left/right/up/down head turns x10 each direction. No UE support. CGA      Standing tapping 3 stepping stones with each LE BUE and CGA. Focus on LLE due to decreased motor coordination. Verbal cues to tap and turn to neutral before tapping next color x77mnutes    Side step with BUE. 3x length of bars CGA. Verbal cues to not drag feet and to decrease speed.  Modified tandem stance 2x30 seconds each leg. No UE support CGA  Patient requires cueing for appropriate mechanics, technique, and task orientation throughout session.                         PT Education - 10/29/17 1112    Education provided  Yes    Education Details  exercise technique, HEP    Person(s) Educated  Patient    Methods  Explanation;Demonstration;Verbal cues    Comprehension  Verbalized understanding;Returned demonstration;Need further instruction       PT Short Term Goals - 10/23/17 1313      PT SHORT TERM GOAL #1   Title  Patient will be independent in HEP demonstrating successful between session carryover and improve functional mobility and ease of completing ADLs.    Baseline  10/8: HEP given next session    Time  2    Period  Weeks    Status  New    Target Date  11/06/17      PT SHORT TERM GOAL #2   Title  Patient will be able to transfer sit<>Stand from regular height chair without pushing on arm rests to exhibit improved independence improved functional strength.     Baseline  10/8: BUE support     Time  2    Period  Weeks    Status  New    Target Date  11/06/17        PT Long Term Goals - 10/23/17 1316      PT LONG TERM GOAL #1   Title  Patient will ambulate >10071fwith rollator during 54m61mdemonstrating improved community ambulation and ease with shopping    Baseline  10/8: [id number] Time  8    Period  Weeks    Status  New    Target Date  12/18/17      PT LONG TERM GOAL #2   Title  Patient (> 58 y19rs old) will complete five times sit to stand test in < 15 seconds indicating an increased LE strength and improved balance.    Baseline  10/8:  26seconds with BUE    Time  8    Period  Weeks    Status  New    Target Date  12/18/17      PT LONG TERM GOAL #3  Title  Patient will demonstrate an improved Berg Balance Score of >42/56 as to demonstrate improved balance with ADLs such as sitting/standing and transfer balance and reduced fall risk.     Baseline  10/8: 37/56    Time  8    Period  Weeks    Status  New    Target Date  12/18/17      PT LONG TERM GOAL #4   Title  Patient will improve LEFS to 54/80 to demonstrate improved fucnctional strenth and mobility and ease of indepedence    Baseline  10/8: 45/80    Time  8    Period  Weeks    Status  New    Target Date  12/18/17      PT LONG TERM GOAL #5   Title  Patient will be able to load and unload rollator indepedently to improve independence and quality of life.     Baseline  10/8: needs assistance to put rollator in and out of car    Time  8    Period  Weeks    Status  New    Target Date  12/18/17            Plan - 10/29/17 1111    Clinical Impression Statement  Patient returns to therapy demonstrating HEP compliance with minimal corrections needed for exercise technique. Continued balance and strengthening exercises to decrease fall risk. Patient had improved motor coordination with LLE with increased repetition and when compared to previous session with stepping stone task. Patient challenged with side stepping due to decreased foot clearance and motor coordination of LLE. Patient will continue to benefit from skilled physical therapy to improve strength, balance, and functional mobility.      Rehab Potential  Good    Clinical Impairments Affecting Rehab Potential  (+) improvement made within last year, eagerness to participate (-) HEP compliance, safety awareness     PT Frequency  2x / week    PT Duration  8 weeks    PT Treatment/Interventions  ADLs/Self Care Home Management;Cryotherapy;Electrical Stimulation;Iontophoresis 42m/ml Dexamethasone;Moist  Heat;Ultrasound;DME Instruction;Gait training;Stair training;Balance training;Therapeutic exercise;Therapeutic activities;Functional mobility training;Neuromuscular re-education;Patient/family education;Manual techniques;Passive range of motion;Energy conservation    PT Next Visit Plan  strength, balance, motor coordination, ambulation with turns    PT Home Exercise Plan  see sheet    Consulted and Agree with Plan of Care  Patient       Patient will benefit from skilled therapeutic intervention in order to improve the following deficits and impairments:  Abnormal gait, Decreased activity tolerance, Decreased balance, Decreased coordination, Decreased endurance, Decreased mobility, Decreased range of motion, Decreased safety awareness, Decreased strength, Difficulty walking, Impaired flexibility, Impaired perceived functional ability, Improper body mechanics, Postural dysfunction, Pain, Impaired UE functional use, Decreased knowledge of precautions  Visit Diagnosis: Unsteadiness on feet  Muscle weakness (generalized)     Problem List Patient Active Problem List   Diagnosis Date Noted  . Cognitive deficits   . Labile blood pressure   . Acute blood loss anemia   . Slow transit constipation   . Vascular headache   . 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 [place] ([place] 07/03/2017  . Communicating [place]cephalus ([place] 07/03/2017  . [place] 04/18/2016  . CD (celiac disease) 08/04/2014  . [place] 08/04[place]2014  . Age related osteoporosis 11/26/2013  . Absolute anemia 05/21/2013  . H/O malignant neoplasm 05/21/2013  . HLD (hyperlipidemia) 05/21/2013   AErick Blinks SPT  This entire session was performed under direct supervision and direction of a licensed therapist/therapist assistant . I have personally read, edited and approve of the note as written.  Janna Arch, PT, DPT   10/29/2017, 1:09 PM  [place] MAIN [facility] SERVICES [address] [place], [place], [address] Phone: [phone]   Fax:  [phone]  Name: Krista Evans MRN: [id number] Date of Birth: December 30, 1945

## 2017-10-31 ENCOUNTER — Ambulatory Visit: Payer: PPO

## 2017-10-31 DIAGNOSIS — M6281 Muscle weakness (generalized): Secondary | ICD-10-CM

## 2017-10-31 DIAGNOSIS — R2681 Unsteadiness on feet: Secondary | ICD-10-CM

## 2017-10-31 DIAGNOSIS — R269 Unspecified abnormalities of gait and mobility: Secondary | ICD-10-CM

## 2017-10-31 NOTE — Therapy (Addendum)
[place] MAIN [facility] SERVICES [address] [place], [place], [address] Phone: [phone]   Fax:  [phone]  Physical Therapy Treatment  Patient Details  Name: Krista Evans MRN: [id number] Date of Birth: 1945/10/03 Referring Provider (PT): Ramonita Lab   Encounter Date: 10/31/2017  PT End of Session - 10/31/17 1142    Visit Number  4    Number of Visits  17    Date for PT Re-Evaluation  12/18/17    Authorization Type  4/10 PN begain 10/23/17    PT Start Time  1052    PT Stop Time  1138    PT Time Calculation (min)  46 min    Equipment Utilized During Treatment  Gait belt    Activity Tolerance  Patient tolerated treatment well    Behavior During Therapy  WFL for tasks assessed/performed       Past Medical History:  Diagnosis Date  . Abnormal Q waves on electrocardiogram   . Anemia   . Aortic atherosclerosis ([place])    "I could possibly have it, my grandmother had it"  . Cancer [facility]) 05/2011   Right upper Lung CA with partial Lobectomy.  . Celiac disease   . Celiac disease   . Dyspnea    with exertion  . Hyperlipidemia   . Hyperlipidemia   . Lung mass   . Meningioma ([place])   . Osteoporosis     Past Surgical History:  Procedure Laterality Date  . LUNG LOBECTOMY     right lung  . 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  Subjective Assessment - 10/31/17 1056    Subjective  Patient states her L leg buckled Monday afternoon going up the steps but she was able to catch herself. Reports HEP compliance.     Pertinent History  Patient stated she received therapy here about a year ago and went home and did not do a lot of exercise. Patient reports around June 2019 she had an MRI which showed enlarged ventricles and hydrocephalus and shunt  was placed. Spent about a week and half in the hospital where she also received therapy. Also received home health PT and OT for about 3 weeks when discharged from hospital but has not done her exercises since. States she is not aware of any shunt precautions but feels the shunt which is described as a sharp pain when she lays late and leans forward. Reports her doctor is currently unaware of these symptoms. Patient states she walks around the house daily and 1x week she walks down the street and back with husband. Patient reports L leg aching after she begins walking to bedroom and back with rollator. Reports this has been happening for a couple years. Patient would like to get stronger, improve balance, and become more independent. Currently requires assistance getting rollator in and out of car.      Limitations  Lifting;Standing;Walking;House hold activities    How long can you sit comfortably?  n/a    How long can you stand comfortably?  5 minutes    How long can you walk comfortably?  limited but patient unsure of duration     Patient Stated Goals  improve balance, strength, be more independent     Currently in Pain?  No/denies  Nustep level 2 3 minutes >60rpm for cardiovascular challenge   Ambulating around 6 cones with rollator for improved ability to navigate turns while ambulating. CGA . Verbal cues to increase step height and length  Ambulating and scanning room to collect 6 cones. Verbal cues to look throughout rooms for cones.  In //bars: Standing step overs and back orange hurdle. x10 each leg. BUE support, CGA Verbal cues to increase step height. Increased difficulty with LLE   Lateral step overs with orange hurdle x10 each leg BUE. CGA Verbal cues to increase step height.    Standing airex pad left/right/up/down head turns x10 each direction. No UE support. CGA   Standing tapping 3 stepping stones with  LLE BUE and CGA. Focus on LLE due to decreased motor coordination.  Verbal cues to tap and turn to neutral before tapping next color. Introdcued 1 step commands calling out specific color x14mnutes   Side step with BUE. 3x length of bars CGA. Verbal cues to not drag feet and to decrease speed.   Modified tandem stance 1x30 seconds each leg. Progressed to tandem stance 2x30 seconds for increased challenge. No UE support CGA. Occasional use of hands for stabilization in tandem stance.   Standing balloon taps on airex pad reaching in/out of BOS for improved balance reactions. x327mutes    Patient requires cueing for appropriate mechanics, technique, and task orientation throughout session.                        PT Education - 10/31/17 1057    Education provided  Yes    Education Details  exercise technique, HEP    Person(s) Educated  Patient    Methods  Explanation;Demonstration;Verbal cues    Comprehension  Verbalized understanding;Returned demonstration       PT Short Term Goals - 10/23/17 1313      PT SHORT TERM GOAL #1   Title  Patient will be independent in HEP demonstrating successful between session carryover and improve functional mobility and ease of completing ADLs.    Baseline  10/8: HEP given next session    Time  2    Period  Weeks    Status  New    Target Date  11/06/17      PT SHORT TERM GOAL #2   Title  Patient will be able to transfer sit<>Stand from regular height chair without pushing on arm rests to exhibit improved independence improved functional strength.     Baseline  10/8: BUE support     Time  2    Period  Weeks    Status  New    Target Date  11/06/17        PT Long Term Goals - 10/23/17 1316      PT LONG TERM GOAL #1   Title  Patient will ambulate >100023fith rollator during [id number] improved community ambulation and ease with shopping    Baseline  10/8: [id number]  8    Period  Weeks    Status  New    Target Date  12/18/17      PT LONG TERM GOAL #2   Title  Patient (> 60  y45rs old) will complete five times sit to stand test in < 15 seconds indicating an increased LE strength and improved balance.    Baseline  10/8: 26seconds with BUE    Time  8    Period  Weeks  Status  New    Target Date  12/18/17      PT LONG TERM GOAL #3   Title  Patient will demonstrate an improved Berg Balance Score of >42/56 as to demonstrate improved balance with ADLs such as sitting/standing and transfer balance and reduced fall risk.     Baseline  10/8: 37/56    Time  8    Period  Weeks    Status  New    Target Date  12/18/17      PT LONG TERM GOAL #4   Title  Patient will improve LEFS to 54/80 to demonstrate improved fucnctional strenth and mobility and ease of indepedence    Baseline  10/8: 45/80    Time  8    Period  Weeks    Status  New    Target Date  12/18/17      PT LONG TERM GOAL #5   Title  Patient will be able to load and unload rollator indepedently to improve independence and quality of life.     Baseline  10/8: needs assistance to put rollator in and out of car    Time  8    Period  Weeks    Status  New    Target Date  12/18/17            Plan - 10/31/17 1142    Clinical Impression Statement  Patient demonstrates slight improvements with motor coordination of LLE with progression of 1step commands following repetitive tapping successfully. Progressed to tandem balance for increased challenge requiring frequent use of UE to maintain stabilization. Patient prefers to hit balloon with only RLE due to left sided impairments. Patient will continue to benefit from skilled physical therapy to improve strength, balance, and functional mobility.    Rehab Potential  Good    Clinical Impairments Affecting Rehab Potential  (+) improvement made within last year, eagerness to participate (-) HEP compliance, safety awareness     PT Frequency  2x / week    PT Duration  8 weeks    PT Treatment/Interventions  ADLs/Self Care Home Management;Cryotherapy;Electrical  Stimulation;Iontophoresis 69m/ml Dexamethasone;Moist Heat;Ultrasound;DME Instruction;Gait training;Stair training;Balance training;Therapeutic exercise;Therapeutic activities;Functional mobility training;Neuromuscular re-education;Patient/family education;Manual techniques;Passive range of motion;Energy conservation    PT Next Visit Plan  strength, balance, motor coordination, ambulation with turns    PT Home Exercise Plan  see sheet    Consulted and Agree with Plan of Care  Patient       Patient will benefit from skilled therapeutic intervention in order to improve the following deficits and impairments:  Abnormal gait, Decreased activity tolerance, Decreased balance, Decreased coordination, Decreased endurance, Decreased mobility, Decreased range of motion, Decreased safety awareness, Decreased strength, Difficulty walking, Impaired flexibility, Impaired perceived functional ability, Improper body mechanics, Postural dysfunction, Pain, Impaired UE functional use, Decreased knowledge of precautions  Visit Diagnosis: Unsteadiness on feet  Muscle weakness (generalized)  Neurologic gait disorder     Problem List Patient Active Problem List   Diagnosis Date Noted  . Cognitive deficits   . Labile blood pressure   . Acute blood loss anemia   . Slow transit constipation   . Vascular headache   . 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 [place] ([place] 07/03/2017  . 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 Pelvic fracture ([place] 04/18/2016  . CD (celiac disease) 08/04/2014  . Cancer of upper lobe of right lung ([place] 08/04/2014  . Age related osteoporosis 11/26/2013  . Absolute anemia 05/21/2013  . H/O malignant neoplasm  05/21/2013  . HLD (hyperlipidemia) 05/21/2013   Erick Blinks, SPT  This entire session was performed under direct supervision and direction of a licensed therapist/therapist assistant . I have personally read, edited and approve of the note as  written.  Janna Arch, PT, DPT   10/31/2017, 12:52 PM  [place] MAIN [facility] SERVICES [address] [place], [place], [address] Phone: [phone]   Fax:  [phone]  Name: MICHAELINE ECKERSLEY MRN: [id number] Date of Birth: May 13, 1945

## 2017-11-05 ENCOUNTER — Ambulatory Visit: Payer: PPO

## 2017-11-05 DIAGNOSIS — M6281 Muscle weakness (generalized): Secondary | ICD-10-CM

## 2017-11-05 DIAGNOSIS — R269 Unspecified abnormalities of gait and mobility: Secondary | ICD-10-CM

## 2017-11-05 DIAGNOSIS — R2681 Unsteadiness on feet: Secondary | ICD-10-CM

## 2017-11-05 NOTE — Therapy (Addendum)
[place] MAIN [facility] SERVICES [address] [place], [place], [address] Phone: [phone]   Fax:  [phone]  Physical Therapy Treatment  Patient Details  Name: Krista Evans MRN: [id number] Date of Birth: [birth year] Referring Provider (PT): Ramonita Lab   Encounter Date: 11/05/2017  PT End of Session - 11/05/17 1214    Visit Number  5    Number of Visits  17    Date for PT Re-Evaluation  12/18/17    Authorization Type  5/10 PN begain 10/23/17    PT Start Time  1031    PT Stop Time  1115    PT Time Calculation (min)  44 min    Equipment Utilized During Treatment  Gait belt    Activity Tolerance  Patient tolerated treatment well    Behavior During Therapy  WFL for tasks assessed/performed       Past Medical History:  Diagnosis Date  . Abnormal Q waves on electrocardiogram   . Anemia   . Aortic atherosclerosis ([place])    "I could possibly have it, my grandmother had it"  . Cancer [facility]) 05/2011   Right upper Lung CA with partial Lobectomy.  . Celiac disease   . Celiac disease   . Dyspnea    with exertion  . Hyperlipidemia   . Hyperlipidemia   . Lung mass   . Meningioma ([place])   . Osteoporosis     Past Surgical History:  Procedure Laterality Date  . LUNG LOBECTOMY     right lung  . 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  Subjective Assessment - 11/05/17 1213    Subjective  Patient states she is doing well. States she has not had any falls or stumbles since last visit. Reports HEP compliance.     Pertinent History  Patient stated she received therapy here about a year ago and went home and did not do a lot of exercise. Patient reports around June 2019 she had an MRI which showed enlarged ventricles and hydrocephalus and shunt was  placed. Spent about a week and half in the hospital where she also received therapy. Also received home health PT and OT for about 3 weeks when discharged from hospital but has not done her exercises since. States she is not aware of any shunt precautions but feels the shunt which is described as a sharp pain when she lays late and leans forward. Reports her doctor is currently unaware of these symptoms. Patient states she walks around the house daily and 1x week she walks down the street and back with husband. Patient reports L leg aching after she begins walking to bedroom and back with rollator. Reports this has been happening for a couple years. Patient would like to get stronger, improve balance, and become more independent. Currently requires assistance getting rollator in and out of car.      Limitations  Lifting;Standing;Walking;House hold activities    How long can you sit comfortably?  n/a    How long can you stand comfortably?  5 minutes    How long can you walk comfortably?  limited but patient unsure of duration     Patient Stated Goals  improve balance, strength, be more independent     Currently in Pain?  No/denies  Ambulating searching for 6 cones with rollator CGA . Verbal cues to increase step height and length. Verbal cues to reach with L UE.    Sit to stands with hands on knees CGA. x10. Verbal cues to sit down slowly.   Seated abduction with RTB. x10 3 seconds holds. Verbal cues to maintain contraction hold.   In //bars: Standing step overs and back with both LEorange hurdle. x10 each leg. BUE support, CGA Verbal cues to increase step height. Increased difficulty with LLE   Lateral step overs with orange hurdle x10 each leg 1 UE support. CGA Verbal cues to increase step height.    Standing airex pad tapping 4in step x10 each LE. 1 UE support. CGA. Verbal cues to increase step height   Standing tapping 3 stepping stones with  LLE BUE and CGA. Focus on LLE due  to decreased motor coordination. Verbal cues to tap and turn to neutral before tapping next color. 1-2-3 step commands calling out specific color x362mnutes    Tandem stance 3x30 seconds for increased challenge. No UE support CGA. Frequent use of hands for stabilization in tandem stance.    Standing heel raises x10 with BUE support. CGA  Standing toe raises x10 with BUE support. CGA. Verbal cues to go entire length of motion.  Standing upright raises with 2lb bar for core activation and strength.   Patient requires cueing for appropriate mechanics, technique, and task orientation throughout session.                     PT Education - 11/05/17 1213    Education provided  Yes    Education Details  exercise technique, HEP    Person(s) Educated  Patient    Methods  Explanation;Demonstration;Verbal cues    Comprehension  Verbalized understanding;Returned demonstration       PT Short Term Goals - 10/23/17 1313      PT SHORT TERM GOAL #1   Title  Patient will be independent in HEP demonstrating successful between session carryover and improve functional mobility and ease of completing ADLs.    Baseline  10/8: HEP given next session    Time  2    Period  Weeks    Status  New    Target Date  11/06/17      PT SHORT TERM GOAL #2   Title  Patient will be able to transfer sit<>Stand from regular height chair without pushing on arm rests to exhibit improved independence improved functional strength.     Baseline  10/8: BUE support     Time  2    Period  Weeks    Status  New    Target Date  11/06/17        PT Long Term Goals - 10/23/17 1316      PT LONG TERM GOAL #1   Title  Patient will ambulate >10059fwith rollator during 62m71mdemonstrating improved community ambulation and ease with shopping    Baseline  10/8: [id number] Time  8    Period  Weeks    Status  New    Target Date  12/18/17      PT LONG TERM GOAL #2   Title  Patient (> 60 y9rs old) will complete  five times sit to stand test in < 15 seconds indicating an increased LE strength and improved balance.    Baseline  10/8: 26seconds with BUE    Time  8  Period  Weeks    Status  New    Target Date  12/18/17      PT LONG TERM GOAL #3   Title  Patient will demonstrate an improved Berg Balance Score of >42/56 as to demonstrate improved balance with ADLs such as sitting/standing and transfer balance and reduced fall risk.     Baseline  10/8: 37/56    Time  8    Period  Weeks    Status  New    Target Date  12/18/17      PT LONG TERM GOAL #4   Title  Patient will improve LEFS to 54/80 to demonstrate improved fucnctional strenth and mobility and ease of indepedence    Baseline  10/8: 45/80    Time  8    Period  Weeks    Status  New    Target Date  12/18/17      PT LONG TERM GOAL #5   Title  Patient will be able to load and unload rollator indepedently to improve independence and quality of life.     Baseline  10/8: needs assistance to put rollator in and out of car    Time  8    Period  Weeks    Status  New    Target Date  12/18/17            Plan - 11/05/17 1210    Clinical Impression Statement  Patient demonstrated improved motor coordination with stepping stone task with improved control and LE movement. Introduced standing toe and heel raises for improved LE and balance. Patient required frequent use of UEs for stabilization during tandem stance to prevent LOB. Patient will continue to benefit from skilled physical therapy to improve strength, balance, and functional mobility.     Rehab Potential  Good    Clinical Impairments Affecting Rehab Potential  (+) improvement made within last year, eagerness to participate (-) HEP compliance, safety awareness     PT Frequency  2x / week    PT Duration  8 weeks    PT Treatment/Interventions  ADLs/Self Care Home Management;Cryotherapy;Electrical Stimulation;Iontophoresis 30m/ml Dexamethasone;Moist Heat;Ultrasound;DME Instruction;Gait  training;Stair training;Balance training;Therapeutic exercise;Therapeutic activities;Functional mobility training;Neuromuscular re-education;Patient/family education;Manual techniques;Passive range of motion;Energy conservation    PT Next Visit Plan  strength, balance, motor coordination, ambulation with turns    PT Home Exercise Plan  see sheet    Consulted and Agree with Plan of Care  Patient       Patient will benefit from skilled therapeutic intervention in order to improve the following deficits and impairments:  Abnormal gait, Decreased activity tolerance, Decreased balance, Decreased coordination, Decreased endurance, Decreased mobility, Decreased range of motion, Decreased safety awareness, Decreased strength, Difficulty walking, Impaired flexibility, Impaired perceived functional ability, Improper body mechanics, Postural dysfunction, Pain, Impaired UE functional use, Decreased knowledge of precautions  Visit Diagnosis: Unsteadiness on feet  Muscle weakness (generalized)  Neurologic gait disorder     Problem List Patient Active Problem List   Diagnosis Date Noted  . Cognitive deficits   . Labile blood pressure   . Acute blood loss anemia   . Slow transit constipation   . Vascular headache   . 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 [place]us ([place] 07/03/2017  . Communicating [place]cephalus ([place] 07/03/2017  . Pelvic fracture ([place] 04/18/2016  . CD (celiac disease) 08/04/2014  . Cancer of [place] lobe of right lung ([place] 08/04/2014  . Age related osteoporosis 11/26/2013  . Absolute anemia 05/21/2013  . H/O malignant  neoplasm 05/21/2013  . HLD (hyperlipidemia) 05/21/2013   Erick Blinks, SPT This entire session was performed under direct supervision and direction of a licensed therapist/therapist assistant . I have personally read, edited and approve of the note as written.  Janna Arch, PT, DPT   11/05/2017, 1:01 PM  [place]  MAIN [facility] SERVICES [address] [place], [place], [address] Phone: [phone]   Fax:  [phone]  Name: Krista Evans MRN: [id number] Date of Birth: [birth year]

## 2017-11-07 ENCOUNTER — Ambulatory Visit: Payer: PPO

## 2017-11-07 DIAGNOSIS — M6281 Muscle weakness (generalized): Secondary | ICD-10-CM

## 2017-11-07 DIAGNOSIS — R2681 Unsteadiness on feet: Secondary | ICD-10-CM | POA: Diagnosis not present

## 2017-11-07 DIAGNOSIS — R269 Unspecified abnormalities of gait and mobility: Secondary | ICD-10-CM

## 2017-11-07 NOTE — Therapy (Addendum)
[place] MAIN [facility] SERVICES [address] [place], [place], [address] Phone: [phone]   Fax:  [phone]  Physical Therapy Treatment  Patient Details  Name: Krista Evans MRN: [id number] Date of Birth: 10/09/1945 Referring Provider (PT): Ramonita Lab   Encounter Date: 11/07/2017  PT End of Session - 11/07/17 1124    Visit Number  6    Number of Visits  17    Date for PT Re-Evaluation  12/18/17    Authorization Type  6/10 PN begain 10/23/17    PT Start Time  1029    PT Stop Time  1114    PT Time Calculation (min)  45 min    Equipment Utilized During Treatment  Gait belt    Activity Tolerance  Patient tolerated treatment well    Behavior During Therapy  WFL for tasks assessed/performed       Past Medical History:  Diagnosis Date  . Abnormal Q waves on electrocardiogram   . Anemia   . Aortic atherosclerosis ([place])    "I could possibly have it, my grandmother had it"  . Cancer [facility]) 05/2011   Right upper Lung CA with partial Lobectomy.  . Celiac disease   . Celiac disease   . Dyspnea    with exertion  . Hyperlipidemia   . Hyperlipidemia   . Lung mass   . Meningioma ([name])   . Osteoporosis     Past Surgical History:  Procedure Laterality Date  . LUNG LOBECTOMY     right lung  . 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  Subjective Assessment - 11/07/17 1032    Subjective  Patient states she does not have any pain or soreness. States she has not had any falls since last session. Reports she has been doing her exercises.     Pertinent History  Patient stated she received therapy here about a year ago and went home and did not do a lot of exercise. Patient reports around June 2019 she had an MRI which showed enlarged ventricles and  hydrocephalus and shunt was placed. Spent about a week and half in the hospital where she also received therapy. Also received home health PT and OT for about 3 weeks when discharged from hospital but has not done her exercises since. States she is not aware of any shunt precautions but feels the shunt which is described as a sharp pain when she lays late and leans forward. Reports her doctor is currently unaware of these symptoms. Patient states she walks around the house daily and 1x week she walks down the street and back with husband. Patient reports L leg aching after she begins walking to bedroom and back with rollator. Reports this has been happening for a couple years. Patient would like to get stronger, improve balance, and become more independent. Currently requires assistance getting rollator in and out of car.      Limitations  Lifting;Standing;Walking;House hold activities    How long can you sit comfortably?  n/a    How long can you stand comfortably?  5 minutes    How long can you walk comfortably?  limited but patient unsure of duration     Patient Stated Goals  improve balance, strength, be more independent     Currently in Pain?  No/denies  Nustep lvl 2 3 minutes >60rpm for cardiovascular challenge  Ambulating around 4 cones with rollator for navigating turns in community. Verbal cues to increase step length and not to shuffle feet. x4 laps    In //bars: Standing step overs and back with both LE orange hurdle. x10 each leg. BUE support, CGA Verbal cues to increase step height. Increased difficulty with LLE   Lateral step overs with orange hurdle x10 each leg 1 UE support. CGA Verbal cues to increase step height.     Standing tapping 3 stepping stones placed on top of airex pad with  LLE BUE and CGA. Focus on LLE due to decreased motor coordination. Verbal cues to tap and turn to neutral before tapping next color. 1-2-3 step commands calling out specific color  x39mnutes     Standing heel to rock on half forma roller, flat side up. CGA. BUE faded to 1UE support  Standing heel raises x10 with BUE support. CGA   Standing toe raises x10 with BUE support. CGA. Verbal cues to go entire length of motion.   Standing upright raises with 2lb bar on airex pad for core activation and strength. CGA  Standing rotations with 2lb bar airex pad CGA. Verbal cues to maintain elbow extension and     Standing walking marches in //bars. CGA. BUE. Verbal cue to bring knees towards ceiling and decrease speed of movement.   Patient requires cueing for appropriate mechanics, technique, and task orientation throughout session.                     PT Education - 11/07/17 1034    Education provided  Yes    Education Details  exercise technique, HEP    Person(s) Educated  Patient    Methods  Explanation;Demonstration;Verbal cues    Comprehension  Verbalized understanding;Returned demonstration       PT Short Term Goals - 10/23/17 1313      PT SHORT TERM GOAL #1   Title  Patient will be independent in HEP demonstrating successful between session carryover and improve functional mobility and ease of completing ADLs.    Baseline  10/8: HEP given next session    Time  2    Period  Weeks    Status  New    Target Date  11/06/17      PT SHORT TERM GOAL #2   Title  Patient will be able to transfer sit<>Stand from regular height chair without pushing on arm rests to exhibit improved independence improved functional strength.     Baseline  10/8: BUE support     Time  2    Period  Weeks    Status  New    Target Date  11/06/17        PT Long Term Goals - 10/23/17 1316      PT LONG TERM GOAL #1   Title  Patient will ambulate >10055fwith rollator during 82m682mdemonstrating improved community ambulation and ease with shopping    Baseline  10/8: [id number] Time  8    Period  Weeks    Status  New    Target Date  12/18/17      PT LONG TERM GOAL #2    Title  Patient (> 60 y63rs old) will complete five times sit to stand test in < 15 seconds indicating an increased LE strength and improved balance.    Baseline  10/8: 26seconds with BUE    Time  8    Period  Weeks    Status  New    Target Date  12/18/17      PT LONG TERM GOAL #3   Title  Patient will demonstrate an improved Berg Balance Score of >42/56 as to demonstrate improved balance with ADLs such as sitting/standing and transfer balance and reduced fall risk.     Baseline  10/8: 37/56    Time  8    Period  Weeks    Status  New    Target Date  12/18/17      PT LONG TERM GOAL #4   Title  Patient will improve LEFS to 54/80 to demonstrate improved fucnctional strenth and mobility and ease of indepedence    Baseline  10/8: 45/80    Time  8    Period  Weeks    Status  New    Target Date  12/18/17      PT LONG TERM GOAL #5   Title  Patient will be able to load and unload rollator indepedently to improve independence and quality of life.     Baseline  10/8: needs assistance to put rollator in and out of car    Time  8    Period  Weeks    Status  New    Target Date  12/18/17            Plan - 11/07/17 1122    Clinical Impression Statement  Patient demonstrated carry over with improved motor control with stepping stone task with ability to complete from raised surface. Patient challenged with walking marches due to poor motor control and difficulty developing movement pattern. Progressed upright raises from airex pad for increased challenge for dynamic balance and stability. Patient will continue to benefit from skilled physical therapy to improve strength, balance, and functional mobility.    Rehab Potential  Good    Clinical Impairments Affecting Rehab Potential  (+) improvement made within last year, eagerness to participate (-) HEP compliance, safety awareness     PT Frequency  2x / week    PT Duration  8 weeks    PT Treatment/Interventions  ADLs/Self Care Home  Management;Cryotherapy;Electrical Stimulation;Iontophoresis 53m/ml Dexamethasone;Moist Heat;Ultrasound;DME Instruction;Gait training;Stair training;Balance training;Therapeutic exercise;Therapeutic activities;Functional mobility training;Neuromuscular re-education;Patient/family education;Manual techniques;Passive range of motion;Energy conservation    PT Next Visit Plan  strength, balance, motor coordination, ambulation with turns, HEP for motor control    PT Home Exercise Plan  see sheet    Consulted and Agree with Plan of Care  Patient       Patient will benefit from skilled therapeutic intervention in order to improve the following deficits and impairments:  Abnormal gait, Decreased activity tolerance, Decreased balance, Decreased coordination, Decreased endurance, Decreased mobility, Decreased range of motion, Decreased safety awareness, Decreased strength, Difficulty walking, Impaired flexibility, Impaired perceived functional ability, Improper body mechanics, Postural dysfunction, Pain, Impaired UE functional use, Decreased knowledge of precautions  Visit Diagnosis: Unsteadiness on feet  Muscle weakness (generalized)  Neurologic gait disorder     Problem List Patient Active Problem List   Diagnosis Date Noted  . Cognitive deficits   . Labile blood pressure   . Acute blood loss anemia   . Slow transit constipation   . Vascular headache   . 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 Hydrocephalus ([place] 07/03/2017  . 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 Pelvic fracture ([place] 04/18/2016  . CD (celiac disease) 08/04/2014  . [place] of [place] of [place] 08/04/2014  .  Age related osteoporosis 11/26/2013  . Absolute anemia 05/21/2013  . H/O malignant neoplasm 05/21/2013  . HLD (hyperlipidemia) 05/21/2013   Erick Blinks, SPT  This entire session was performed under direct supervision and direction of a licensed therapist/therapist assistant . I have personally  read, edited and approve of the note as written.  Janna Arch, PT, DPT   11/07/2017, 12:48 PM  [place] MAIN [facility] SERVICES [address] [place], [place], [address] Phone: [phone]   Fax:  [phone]  Name: MARYSUE FAIT MRN: [id number] Date of Birth: May 03, 1945

## 2017-11-12 ENCOUNTER — Ambulatory Visit: Payer: PPO

## 2017-11-12 DIAGNOSIS — R269 Unspecified abnormalities of gait and mobility: Secondary | ICD-10-CM

## 2017-11-12 DIAGNOSIS — R2681 Unsteadiness on feet: Secondary | ICD-10-CM

## 2017-11-12 DIAGNOSIS — M6281 Muscle weakness (generalized): Secondary | ICD-10-CM

## 2017-11-12 NOTE — Therapy (Addendum)
[place] MAIN [facility] SERVICES [address] [place], [place], [address] Phone: [phone]   Fax:  [phone]  Physical Therapy Treatment  Patient Details  Name: Krista Evans MRN: [id number] Date of Birth: 1945-05-18 Referring Provider (PT): Ramonita Lab   Encounter Date: 11/12/2017  PT End of Session - 11/12/17 1115    Visit Number  7    Number of Visits  17    Date for PT Re-Evaluation  12/18/17    Authorization Type  7/10 PN begain 10/23/17    PT Start Time  1024    PT Stop Time  1110    PT Time Calculation (min)  46 min    Equipment Utilized During Treatment  Gait belt    Activity Tolerance  Patient tolerated treatment well    Behavior During Therapy  WFL for tasks assessed/performed       Past Medical History:  Diagnosis Date  . Abnormal Q waves on electrocardiogram   . Anemia   . Aortic atherosclerosis ([place])    "I could possibly have it, my grandmother had it"  . Cancer [facility]) 05/2011   Right upper Lung CA with partial Lobectomy.  . Celiac disease   . Celiac disease   . Dyspnea    with exertion  . Hyperlipidemia   . Hyperlipidemia   . Lung mass   . Meningioma ([place])   . Osteoporosis     Past Surgical History:  Procedure Laterality Date  . LUNG LOBECTOMY     right lung  . 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  Subjective Assessment - 11/12/17 1028    Subjective  Patient reports doing well and had a good weekend. States she had mild soreness after last session. Reports HEP compliance. Reports no falls or stumbles since last visit.  Patient asked when we could begin practicing walking without rollator.     Pertinent History  Patient stated she received therapy here about a year ago and went home and did not do a lot of  exercise. Patient reports around June 2019 she had an MRI which showed enlarged ventricles and hydrocephalus and shunt was placed. Spent about a week and half in the hospital where she also received therapy. Also received home health PT and OT for about 3 weeks when discharged from hospital but has not done her exercises since. States she is not aware of any shunt precautions but feels the shunt which is described as a sharp pain when she lays late and leans forward. Reports her doctor is currently unaware of these symptoms. Patient states she walks around the house daily and 1x week she walks down the street and back with husband. Patient reports L leg aching after she begins walking to bedroom and back with rollator. Reports this has been happening for a couple years. Patient would like to get stronger, improve balance, and become more independent. Currently requires assistance getting rollator in and out of car.      Limitations  Lifting;Standing;Walking;House hold activities    How long can you sit comfortably?  n/a    How long can you stand comfortably?  5 minutes    How long can you walk comfortably?  limited but patient unsure of duration     Patient Stated Goals  improve balance, strength, be  more independent     Currently in Pain?  No/denies          Nustep lvl 2 4 minutes >60rpm for cardiovascular challenge  Leg press x15 double leg. 90lbs. Verbal cues to control speed of descend and not let knee lock.    In //bars:  Side stepping x 2 lengths of bars. Verbal cues to maintain neutral feet alignment. Verbal cues to not slide feet.  Standing walking marches in //bars. CGA. BUE. Verbal cue to bring knees towards ceiling and decrease speed of movement. x3 lengths of bars   Standing tapping 3 cones with LLE BUE and CGA. Focus on LLE due to decreased motor coordination. Verbal cues to tap and turn to neutral before tapping next color. 1-2-3 step commands calling out specific color x48mnutes      Standing heel to rock on half form roller, flat side up. CGA.  1UE support   Standing heel raises x10 with 1 UE support. CGA   Standing toe raises x10 with 1 UE support. CGA. Verbal cues to go entire length of motion.   Standing upright raises with 3lb bar on airex pad for core activation and strength. CGA   Standing rotations with 3lb bar airex pad CGA. Verbal cues to maintain elbow extension and    Standing on airex pad  Spelling out EXERCISE on sticky notes. Cued to reach for one letter at a time with LUE. Reaching in all directions No UE support. CGA x2. 2nd set in narrow BOS.     Patient requires cueing for appropriate mechanics, technique, and task orientation throughout session.                       PT Education - 11/12/17 1115    Education provided  Yes    Education Details  exercise technique, HEP    Person(s) Educated  Patient    Methods  Explanation;Demonstration;Verbal cues    Comprehension  Returned demonstration;Verbalized understanding       PT Short Term Goals - 10/23/17 1313      PT SHORT TERM GOAL #1   Title  Patient will be independent in HEP demonstrating successful between session carryover and improve functional mobility and ease of completing ADLs.    Baseline  10/8: HEP given next session    Time  2    Period  Weeks    Status  New    Target Date  11/06/17      PT SHORT TERM GOAL #2   Title  Patient will be able to transfer sit<>Stand from regular height chair without pushing on arm rests to exhibit improved independence improved functional strength.     Baseline  10/8: BUE support     Time  2    Period  Weeks    Status  New    Target Date  11/06/17        PT Long Term Goals - 10/23/17 1316      PT LONG TERM GOAL #1   Title  Patient will ambulate >1003fwith rollator during 47m109mdemonstrating improved community ambulation and ease with shopping    Baseline  10/8: 845f10f Time  8    Period  Weeks    Status  New     Target Date  12/18/17      PT LONG TERM GOAL #2   Title  Patient (> 60 y60rs old) will complete five times sit to stand test in <  15 seconds indicating an increased LE strength and improved balance.    Baseline  10/8: 26seconds with BUE    Time  8    Period  Weeks    Status  New    Target Date  12/18/17      PT LONG TERM GOAL #3   Title  Patient will demonstrate an improved Berg Balance Score of >42/56 as to demonstrate improved balance with ADLs such as sitting/standing and transfer balance and reduced fall risk.     Baseline  10/8: 37/56    Time  8    Period  Weeks    Status  New    Target Date  12/18/17      PT LONG TERM GOAL #4   Title  Patient will improve LEFS to 54/80 to demonstrate improved fucnctional strenth and mobility and ease of indepedence    Baseline  10/8: 45/80    Time  8    Period  Weeks    Status  New    Target Date  12/18/17      PT LONG TERM GOAL #5   Title  Patient will be able to load and unload rollator indepedently to improve independence and quality of life.     Baseline  10/8: needs assistance to put rollator in and out of car    Time  8    Period  Weeks    Status  New    Target Date  12/18/17            Plan - 11/12/17 1120    Clinical Impression Statement  Progressed patient to tapping cones to continue address motor control impairments with LLE with addition of controlling magnitude of force to not knock over cones. Patient successfully completed task with 0 cones knocked over. Introduced leg press with increased challenge and fatigue of BLE.  Patient will continue to benefit from skilled physical therapy to improve strength, balance, and functional mobility.     Rehab Potential  Good    Clinical Impairments Affecting Rehab Potential  (+) improvement made within last year, eagerness to participate (-) HEP compliance, safety awareness     PT Frequency  2x / week    PT Duration  8 weeks    PT Treatment/Interventions  ADLs/Self Care Home  Management;Cryotherapy;Electrical Stimulation;Iontophoresis 60m/ml Dexamethasone;Moist Heat;Ultrasound;DME Instruction;Gait training;Stair training;Balance training;Therapeutic exercise;Therapeutic activities;Functional mobility training;Neuromuscular re-education;Patient/family education;Manual techniques;Passive range of motion;Energy conservation    PT Next Visit Plan  new HEP, ambulate without rollator    PT Home Exercise Plan  see sheet    Consulted and Agree with Plan of Care  Patient       Patient will benefit from skilled therapeutic intervention in order to improve the following deficits and impairments:  Abnormal gait, Decreased activity tolerance, Decreased balance, Decreased coordination, Decreased endurance, Decreased mobility, Decreased range of motion, Decreased safety awareness, Decreased strength, Difficulty walking, Impaired flexibility, Impaired perceived functional ability, Improper body mechanics, Postural dysfunction, Pain, Impaired UE functional use, Decreased knowledge of precautions  Visit Diagnosis: Unsteadiness on feet  Muscle weakness (generalized)  Neurologic gait disorder     Problem List Patient Active Problem List   Diagnosis Date Noted  . Cognitive deficits   . Labile blood pressure   . Acute blood loss anemia   . Slow transit constipation   . Vascular headache   . 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 [place]cephal[place] ([place] 07/03/2017  . Communicating hydrocephalus ([place] 07/03/2017  . Pelvic fracture ([place] 04/18/2016  .  CD (celiac disease) 08/04/2014  . Cancer of upper lobe of right lung ([place]) 08/04/2014  . Age related osteoporosis 11/26/2013  . Absolute anemia 05/21/2013  . H/O malignant neoplasm 05/21/2013  . HLD (hyperlipidemia) 05/21/2013   Erick Blinks, SPT This entire session was performed under direct supervision and direction of a licensed therapist/therapist assistant . I have personally read, edited and approve of the note as  written.  Janna Arch, PT, DPT   11/12/2017, 1:05 PM  [place] MAIN [facility] SERVICES [address] [place], [place], [address] Phone: [phone]   Fax:  [phone]  Name: ODEAL WELDEN MRN: [id number] Date of Birth: 1946-01-13

## 2017-11-14 ENCOUNTER — Ambulatory Visit: Payer: PPO

## 2017-11-16 DIAGNOSIS — R030 Elevated blood-pressure reading, without diagnosis of hypertension: Secondary | ICD-10-CM | POA: Diagnosis not present

## 2017-11-16 DIAGNOSIS — G910 Communicating hydrocephalus: Secondary | ICD-10-CM | POA: Diagnosis not present

## 2017-11-19 ENCOUNTER — Ambulatory Visit: Payer: PPO | Attending: Internal Medicine

## 2017-11-19 DIAGNOSIS — M6281 Muscle weakness (generalized): Secondary | ICD-10-CM

## 2017-11-19 DIAGNOSIS — R2681 Unsteadiness on feet: Secondary | ICD-10-CM | POA: Diagnosis not present

## 2017-11-19 DIAGNOSIS — R269 Unspecified abnormalities of gait and mobility: Secondary | ICD-10-CM | POA: Diagnosis not present

## 2017-11-19 NOTE — Patient Instructions (Signed)
Access Code: [email or web address]  URL: [email or web address]  Date: 11/12/2017  Prepared by: Janna Arch

## 2017-11-19 NOTE — Therapy (Addendum)
[place] MAIN [facility] SERVICES [address] [place], [place], [address] Phone: [phone]   Fax:  [phone]  Physical Therapy Treatment  Patient Details  Name: Krista Evans MRN: [id number] Date of Birth: January 17, 1974 Referring Provider (PT): Ramonita Lab   Encounter Date: 11/19/2017  PT End of Session - 11/19/17 1115    Visit Number  8    Number of Visits  17    Date for PT Re-Evaluation  12/18/17    Authorization Type  8/10 PN begain 10/23/17    PT Start Time  1028    PT Stop Time  1114    PT Time Calculation (min)  46 min    Equipment Utilized During Treatment  Gait belt    Activity Tolerance  Patient tolerated treatment well    Behavior During Therapy  WFL for tasks assessed/performed       Past Medical History:  Diagnosis Date  . Abnormal Q waves on electrocardiogram   . Anemia   . Aortic atherosclerosis ([place])    "I could possibly have it, my grandmother had it"  . Cancer [facility]) 05/2011   Right upper Lung CA with partial Lobectomy.  . Celiac disease   . Celiac disease   . Dyspnea    with exertion  . Hyperlipidemia   . Hyperlipidemia   . Lung mass   . Meningioma ([place])   . Osteoporosis     Past Surgical History:  Procedure Laterality Date  . LUNG LOBECTOMY     right lung  . 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  Subjective Assessment - 11/19/17 1031    Subjective  Patient states she went to doctor for routine visit on Friday and reports everything went well. Reports no falls since last visit. Reports no pain. States husband has surgery on Wednesday and she may have to change appointment.     Pertinent History  Patient stated she received therapy here about a year ago and went home and did not do a lot of exercise. Patient  reports around June 2019 she had an MRI which showed enlarged ventricles and hydrocephalus and shunt was placed. Spent about a week and half in the hospital where she also received therapy. Also received home health PT and OT for about 3 weeks when discharged from hospital but has not done her exercises since. States she is not aware of any shunt precautions but feels the shunt which is described as a sharp pain when she lays late and leans forward. Reports her doctor is currently unaware of these symptoms. Patient states she walks around the house daily and 1x week she walks down the street and back with husband. Patient reports L leg aching after she begins walking to bedroom and back with rollator. Reports this has been happening for a couple years. Patient would like to get stronger, improve balance, and become more independent. Currently requires assistance getting rollator in and out of car.      Limitations  Lifting;Standing;Walking;House hold activities    How long can you sit comfortably?  n/a    How long can you stand comfortably?  5 minutes    How long can you walk comfortably?  limited but patient unsure of duration     Patient Stated Goals  improve balance, strength, be more  independent     Currently in Pain?  No/denies            Leg press 2x10 double leg. 90lbs. Verbal cues to control speed of descend and not let knee lock. 2nd set 105lbs. Verbal cues to prevent knee valgus   Review new updated HEP . Sit to Stand - 10 reps - 2 sets - 1x daily - 7x weekly  . Tandem Stance with Support - 10 reps - 2 sets - 30 hold - 1x daily - 7x weekly  . Standing Hip Abduction - 10 reps - 2 sets - 5 hold - 1x daily - 7x weekly  . Standing Marching - 10 reps - 2 sets - 5 hold - 1x daily - 7x weekly   Ambulate 1 lap around therapy room with no AD. CGA. Verbal cues for increased BOS. No LOB  In //bars:  Ambulate without UE support. X8 lengths of bars. CGA    Standing heel to rock on half form  roller, flat side up. CGA.  1UE support. x20   Standing heel raises x10 with 1 UE support. CGA   Standing toe raises x10 with 1 UE support. CGA. Verbal cues to go entire length of motion.   Standing upright raises with 3lb bar on airex pad for core activation and strength. CGA   Standing rotations with 3lb bar airex pad CGA. Verbal visual cues to maintain elbow extension and to use trunk and abdominal muscles   Standing on airex pad in narrow BOS  playing tic tac toe with LUE. No UE support. CGA   Patient requires cueing for appropriate mechanics, technique, and task orientation throughout session.   Access Code: [email or web address]  URL: [email or web address]  Date: 11/12/2017  Prepared by: Janna Arch                       PT Education - 11/19/17 1115    Education provided  Yes    Education Details  exercise technique, new HEP, gait    Person(s) Educated  Patient    Methods  Explanation;Demonstration;Verbal cues;Handout    Comprehension  Verbalized understanding;Returned demonstration       PT Short Term Goals - 10/23/17 1313      PT SHORT TERM GOAL #1   Title  Patient will be independent in HEP demonstrating successful between session carryover and improve functional mobility and ease of completing ADLs.    Baseline  10/8: HEP given next session    Time  2    Period  Weeks    Status  New    Target Date  11/06/17      PT SHORT TERM GOAL #2   Title  Patient will be able to transfer sit<>Stand from regular height chair without pushing on arm rests to exhibit improved independence improved functional strength.     Baseline  10/8: BUE support     Time  2    Period  Weeks    Status  New    Target Date  11/06/17        PT Long Term Goals - 10/23/17 1316      PT LONG TERM GOAL #1   Title  Patient will ambulate >1055f with rollator during 653m demonstrating improved community ambulation and ease with shopping    Baseline  10/8: 84590f  Time   8    Period  Weeks    Status  New    Target Date  12/18/17      PT LONG TERM GOAL #2   Title  Patient (> 74 years old) will complete five times sit to stand test in < 15 seconds indicating an increased LE strength and improved balance.    Baseline  10/8: 26seconds with BUE    Time  8    Period  Weeks    Status  New    Target Date  12/18/17      PT LONG TERM GOAL #3   Title  Patient will demonstrate an improved Berg Balance Score of >42/56 as to demonstrate improved balance with ADLs such as sitting/standing and transfer balance and reduced fall risk.     Baseline  10/8: 37/56    Time  8    Period  Weeks    Status  New    Target Date  12/18/17      PT LONG TERM GOAL #4   Title  Patient will improve LEFS to 54/80 to demonstrate improved fucnctional strenth and mobility and ease of indepedence    Baseline  10/8: 45/80    Time  8    Period  Weeks    Status  New    Target Date  12/18/17      PT LONG TERM GOAL #5   Title  Patient will be able to load and unload rollator indepedently to improve independence and quality of life.     Baseline  10/8: needs assistance to put rollator in and out of car    Time  8    Period  Weeks    Status  New    Target Date  12/18/17            Plan - 11/19/17 1119    Clinical Impression Statement  Introduced ambulating without assistive device inside parallel bars with no LOB or requirement of UEs for stabilization. Progressed to ambulating 1 lap around therapy room without assistive device with no LOB and minimal cues for increased BOS. Increased weight of leg press for increased lower extremity strength but still requires verbal cues for technique and prevention of knee valgus. Patient will continue to benefit from skilled physical therapy to improve strength, balance, and functional mobility.     Rehab Potential  Good    Clinical Impairments Affecting Rehab Potential  (+) improvement made within last year, eagerness to participate (-) HEP  compliance, safety awareness     PT Frequency  2x / week    PT Duration  8 weeks    PT Treatment/Interventions  ADLs/Self Care Home Management;Cryotherapy;Electrical Stimulation;Iontophoresis 40m/ml Dexamethasone;Moist Heat;Ultrasound;DME Instruction;Gait training;Stair training;Balance training;Therapeutic exercise;Therapeutic activities;Functional mobility training;Neuromuscular re-education;Patient/family education;Manual techniques;Passive range of motion;Energy conservation    PT Next Visit Plan  ambulate without rollator    PT Home Exercise Plan  sit to stands, standing hip abduction, standing marches, tandem stance    Consulted and Agree with Plan of Care  Patient       Patient will benefit from skilled therapeutic intervention in order to improve the following deficits and impairments:  Abnormal gait, Decreased activity tolerance, Decreased balance, Decreased coordination, Decreased endurance, Decreased mobility, Decreased range of motion, Decreased safety awareness, Decreased strength, Difficulty walking, Impaired flexibility, Impaired perceived functional ability, Improper body mechanics, Postural dysfunction, Pain, Impaired UE functional use, Decreased knowledge of precautions  Visit Diagnosis: Unsteadiness on feet  Muscle weakness (generalized)  Neurologic gait disorder     Problem List Patient Active Problem List   Diagnosis Date Noted  . Cognitive  deficits   . Labile blood pressure   . Acute blood loss anemia   . Slow transit constipation   . Vascular headache   . 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 Hydrocephalus ([place]) 07/03/2017  . 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 Pelvic fracture ([place]) 04/18/2016  . CD (celiac disease) 08/04/2014  . Cancer of upper lobe of right lung ([place]) 08/04/2014  . Age related osteoporosis 11/26/2013  . Absolute anemia 05/21/2013  . H/O malignant neoplasm 05/21/2013  . HLD (hyperlipidemia) 05/21/2013    Erick Blinks,  SPT This entire session was performed under direct supervision and direction of a licensed therapist/therapist assistant . I have personally read, edited and approve of the note as written.  Janna Arch, PT, DPT   11/19/2017, 11:29 AM  [place] MAIN [facility] SERVICES [address] [place], [place], [address] Phone: [phone]   Fax:  [phone]  Name: Krista Evans MRN: [id number] Date of Birth: Apr 02, 1945

## 2017-11-21 ENCOUNTER — Ambulatory Visit: Payer: PPO

## 2017-11-21 DIAGNOSIS — M6281 Muscle weakness (generalized): Secondary | ICD-10-CM

## 2017-11-21 DIAGNOSIS — R2681 Unsteadiness on feet: Secondary | ICD-10-CM | POA: Diagnosis not present

## 2017-11-21 DIAGNOSIS — R269 Unspecified abnormalities of gait and mobility: Secondary | ICD-10-CM

## 2017-11-21 NOTE — Therapy (Addendum)
[place] MAIN [facility] SERVICES [address] [place], [place], [address] Phone: [phone]   Fax:  [phone]  Physical Therapy Treatment  Patient Details  Name: Krista Evans MRN: [id number] Date of Birth: 04/22/1945 Referring Provider (PT): Ramonita Lab   Encounter Date: 11/21/2017  PT End of Session - 11/21/17 1025    Visit Number  9    Number of Visits  17    Date for PT Re-Evaluation  12/18/17    Authorization Type  9/10 PN begain 10/23/17    PT Start Time  1021    PT Stop Time  1105    PT Time Calculation (min)  44 min    Equipment Utilized During Treatment  Gait belt    Activity Tolerance  Patient tolerated treatment well    Behavior During Therapy  WFL for tasks assessed/performed       Past Medical History:  Diagnosis Date  . Abnormal Q waves on electrocardiogram   . Anemia   . Aortic atherosclerosis ([place])    "I could possibly have it, my grandmother had it"  . Cancer [facility]) 05/2011   Right upper Lung CA with partial Lobectomy.  . Celiac disease   . Celiac disease   . Dyspnea    with exertion  . Hyperlipidemia   . Hyperlipidemia   . Lung mass   . Meningioma ([place])   . Osteoporosis     Past Surgical History:  Procedure Laterality Date  . LUNG LOBECTOMY     right lung  . 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  Subjective Assessment - 11/21/17 1023    Subjective  Patient states she is doing well. Reports husbands surgery went well. Denies any falls since last visit. Patient reports difficulty with curbs when out in community.     Pertinent History  Patient stated she received therapy here about a year ago and went home and did not do a lot of exercise. Patient reports around June 2019 she had an MRI which showed enlarged  ventricles and hydrocephalus and shunt was placed. Spent about a week and half in the hospital where she also received therapy. Also received home health PT and OT for about 3 weeks when discharged from hospital but has not done her exercises since. States she is not aware of any shunt precautions but feels the shunt which is described as a sharp pain when she lays late and leans forward. Reports her doctor is currently unaware of these symptoms. Patient states she walks around the house daily and 1x week she walks down the street and back with husband. Patient reports L leg aching after she begins walking to bedroom and back with rollator. Reports this has been happening for a couple years. Patient would like to get stronger, improve balance, and become more independent. Currently requires assistance getting rollator in and out of car.      Limitations  Lifting;Standing;Walking;House hold activities    How long can you sit comfortably?  n/a    How long can you stand comfortably?  5 minutes    How long can you walk comfortably?  limited but patient unsure of duration     Patient Stated Goals  improve balance, strength, be more independent     Currently in Pain?  No/denies  Nustep lvl 2 4 minutes >60rpm for cardiovascular challenge   In //bars:  Standing step up onto 4 inch step. x15 1 UE support. x10 without UE support. Patient required constant cueing to not use UEs due to difficulty and fear. Verbal cues to go up with RLE and down with LLE  Stepping backwards over RTB x10 each LE. 1 UE support. CGA  Backwards walking x3 lengths of bars. CGA. 1UE support. Verbal and visual cues to take big steps.   Standing heel raises x10 with 1 UE support. CGA   Standing toe raises x10 with 1 UE support. CGA. Verbal cues to go entire length of motion.  Standing tapping 3 stepping stone on 4 inch step with BLE BUE and CGA. Focus on LLE due to decreased motor coordination. Verbal cues to tap  and turn to neutral before tapping next color. 1-2-3-4  step commands calling out specific color x45mnutes      Patient requires cueing for appropriate mechanics, technique, and task orientation throughout session.                      PT Education - 11/21/17 1024    Education provided  Yes    Education Details  exercise technique, HEP, gait    Person(s) Educated  Patient    Methods  Explanation;Demonstration;Verbal cues    Comprehension  Verbalized understanding;Returned demonstration       PT Short Term Goals - 10/23/17 1313      PT SHORT TERM GOAL #1   Title  Patient will be independent in HEP demonstrating successful between session carryover and improve functional mobility and ease of completing ADLs.    Baseline  10/8: HEP given next session    Time  2    Period  Weeks    Status  New    Target Date  11/06/17      PT SHORT TERM GOAL #2   Title  Patient will be able to transfer sit<>Stand from regular height chair without pushing on arm rests to exhibit improved independence improved functional strength.     Baseline  10/8: BUE support     Time  2    Period  Weeks    Status  New    Target Date  11/06/17        PT Long Term Goals - 10/23/17 1316      PT LONG TERM GOAL #1   Title  Patient will ambulate >10055fwith rollator during 45m18mdemonstrating improved community ambulation and ease with shopping    Baseline  10/8: 845f48f Time  8    Period  Weeks    Status  New    Target Date  12/18/17      PT LONG TERM GOAL #2   Title  Patient (> 58 y76rs old) will complete five times sit to stand test in < 15 seconds indicating an increased LE strength and improved balance.    Baseline  10/8: 26seconds with BUE    Time  8    Period  Weeks    Status  New    Target Date  12/18/17      PT LONG TERM GOAL #3   Title  Patient will demonstrate an improved Berg Balance Score of >42/56 as to demonstrate improved balance with ADLs such as sitting/standing and  transfer balance and reduced fall risk.     Baseline  10/8: 37/56    Time  8  Period  Weeks    Status  New    Target Date  12/18/17      PT LONG TERM GOAL #4   Title  Patient will improve LEFS to 54/80 to demonstrate improved fucnctional strenth and mobility and ease of indepedence    Baseline  10/8: 45/80    Time  8    Period  Weeks    Status  New    Target Date  12/18/17      PT LONG TERM GOAL #5   Title  Patient will be able to load and unload rollator indepedently to improve independence and quality of life.     Baseline  10/8: needs assistance to put rollator in and out of car    Time  8    Period  Weeks    Status  New    Target Date  12/18/17            Plan - 11/21/17 1110    Clinical Impression Statement  Patient was able to complete step up onto 4inch step without UE support to increase ease of navigating curbs however required constant cueing and encouragement due to difficulty and fear of LOB. Patient had difficulty with backwards walking with decreased motor control of LLE and completed with small shuffled steps with mild improvement with cueing. Patient will continue to benefit from skilled physical therapy to improve strength, balance, and functional mobility.      Rehab Potential  Good    Clinical Impairments Affecting Rehab Potential  (+) improvement made within last year, eagerness to participate (-) HEP compliance, safety awareness     PT Frequency  2x / week    PT Duration  8 weeks    PT Treatment/Interventions  ADLs/Self Care Home Management;Cryotherapy;Electrical Stimulation;Iontophoresis 47m/ml Dexamethasone;Moist Heat;Ultrasound;DME Instruction;Gait training;Stair training;Balance training;Therapeutic exercise;Therapeutic activities;Functional mobility training;Neuromuscular re-education;Patient/family education;Manual techniques;Passive range of motion;Energy conservation    PT Next Visit Plan  ambulate without rollator    PT Home Exercise Plan  sit to  stands, standing hip abduction, standing marches, tandem stance    Consulted and Agree with Plan of Care  Patient       Patient will benefit from skilled therapeutic intervention in order to improve the following deficits and impairments:  Abnormal gait, Decreased activity tolerance, Decreased balance, Decreased coordination, Decreased endurance, Decreased mobility, Decreased range of motion, Decreased safety awareness, Decreased strength, Difficulty walking, Impaired flexibility, Impaired perceived functional ability, Improper body mechanics, Postural dysfunction, Pain, Impaired UE functional use, Decreased knowledge of precautions  Visit Diagnosis: Unsteadiness on feet  Muscle weakness (generalized)  Neurologic gait disorder     Problem List Patient Active Problem List   Diagnosis Date Noted  . Cognitive deficits   . Labile blood pressure   . Acute blood loss anemia   . Slow transit constipation   . Vascular headache   . 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 Hydrocephalus ([place] 07/03/2017  . [place]icating [place] ([place] 07/03/2017  . Pelvic [place] ([place] 04/18/2016  . CD (celiac disease) 08/04/2014  . [place] of [place] of right lung ([place] 08/04/2014  . Age related osteoporosis 11/26/2013  . Absolute anemia 05/21/2013  . H/O malignant neoplasm 05/21/2013  . HLD (hyperlipidemia) 05/21/2013  AErick Blinks SPT  This entire session was performed under direct supervision and direction of a licensed therapist/therapist assistant . I have personally read, edited and approve of the note as written.  MJanna Arch PT, DPT   11/21/2017, 12:07 PM  [place]  [place] MAIN [facility] SERVICES [place], [place], [address] Phone: [phone]   Fax:  [phone]  Name: Krista Evans MRN: [id number] Date of Birth: 1945-07-08

## 2017-11-26 ENCOUNTER — Ambulatory Visit: Payer: PPO

## 2017-11-26 DIAGNOSIS — M6281 Muscle weakness (generalized): Secondary | ICD-10-CM

## 2017-11-26 DIAGNOSIS — R2681 Unsteadiness on feet: Secondary | ICD-10-CM

## 2017-11-26 DIAGNOSIS — R269 Unspecified abnormalities of gait and mobility: Secondary | ICD-10-CM

## 2017-11-26 NOTE — Therapy (Addendum)
[place] MAIN [facility] SERVICES [address] [place], [place], [address] Phone: [phone]   Fax:  [phone]  Physical Therapy Treatment Physical Therapy Progress Note   Dates of reporting period  10/23/17   to   11/26/17   Patient Details  Name: Krista Evans MRN: [id number] Date of Birth: 1945-10-08 Referring Provider (PT): Ramonita Lab   Encounter Date: 11/26/2017  PT End of Session - 11/26/17 1256    Visit Number  10    Number of Visits  17    Date for PT Re-Evaluation  12/18/17    Authorization Type  10/10 PN begain 10/23/17 (1/10 PN begin 11/11)    PT Start Time  1030    PT Stop Time  1114    PT Time Calculation (min)  44 min    Equipment Utilized During Treatment  Gait belt    Activity Tolerance  Patient tolerated treatment well    Behavior During Therapy  WFL for tasks assessed/performed       Past Medical History:  Diagnosis Date  . Abnormal Q waves on electrocardiogram   . Anemia   . Aortic atherosclerosis ([place])    "I could possibly have it, my grandmother had it"  . Cancer [facility]) 05/2011   Right upper Lung CA with partial Lobectomy.  . Celiac disease   . Celiac disease   . Dyspnea    with exertion  . Hyperlipidemia   . Hyperlipidemia   . Lung mass   . Meningioma ([place])   . Osteoporosis     Past Surgical History:  Procedure Laterality Date  . LUNG LOBECTOMY     right lung  . 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  Subjective Assessment - 11/26/17 1034    Subjective  Patient states she had a good weekend. Reports she is nervous about completing her goals today. Reports she is getting stronger which has helped with her walking. Reports HEP compliance. Denies any falls since last visit.     Pertinent History  Patient  stated she received therapy here about a year ago and went home and did not do a lot of exercise. Patient reports around June 2019 she had an MRI which showed enlarged ventricles and hydrocephalus and shunt was placed. Spent about a week and half in the hospital where she also received therapy. Also received home health PT and OT for about 3 weeks when discharged from hospital but has not done her exercises since. States she is not aware of any shunt precautions but feels the shunt which is described as a sharp pain when she lays late and leans forward. Reports her doctor is currently unaware of these symptoms. Patient states she walks around the house daily and 1x week she walks down the street and back with husband. Patient reports L leg aching after she begins walking to bedroom and back with rollator. Reports this has been happening for a couple years. Patient would like to get stronger, improve balance, and become more independent. Currently requires assistance getting rollator in and out of car.      Limitations  Lifting;Standing;Walking;House hold activities    How long can you sit comfortably?  n/a    How long can you stand comfortably?  5 minutes    How long can you walk  comfortably?  limited but patient unsure of duration     Patient Stated Goals  improve balance, strength, be more independent     Currently in Pain?  No/denies         [facility] PT Assessment - 11/26/17 0001      Berg Balance Test   Sit to Stand  Able to stand  independently using hands    Standing Unsupported  Able to stand safely 2 minutes    Sitting with Back Unsupported but Feet Supported on Floor or Stool  Able to sit safely and securely 2 minutes    Stand to Sit  Controls descent by using hands    Transfers  Able to transfer safely, definite need of hands    Standing Unsupported with Eyes Closed  Able to stand 10 seconds safely    Standing Ubsupported with Feet Together  Able to place feet together independently and  stand 1 minute safely    From Standing, Reach Forward with Outstretched Arm  Can reach forward >12 cm safely (5")    From Standing Position, Pick up Object from Floor  Able to pick up shoe safely and easily    From Standing Position, Turn to Look Behind Over each Shoulder  Looks behind from both sides and weight shifts well    Turn 360 Degrees  Needs close supervision or verbal cueing    Standing Unsupported, Alternately Place Feet on Step/Stool  Needs assistance to keep from falling or unable to try    Standing Unsupported, One Foot in Front  Able to take small step independently and hold 30 seconds    Standing on One Leg  Tries to lift leg/unable to hold 3 seconds but remains standing independently    Total Score  40        PROGRESS NOTE  HEP- compliance per report Sit to stand without UE support- with hands on knees 29mt- 8990fwith rollator 5xsts- 24 seconds BUE 1x with 1 UE support BERG- 40/46 LEFS- 41/80 Load/unload rollator independently- patient still requires assistance per report   Nustep lvl 2 4 minutes >60rpm for cardiovascular challenge  Leg press x12 105lbs. Verbal cues to complete slowly and not let knees snap back. Cues to keep knees from coming together.     Patient's condition has the potential to improve in response to therapy. Maximum improvement is yet to be obtained. The anticipated improvement is attainable and reasonable in a generally predictable time.  Patient reports she is stronger                       PT Education - 11/26/17 1256    Education provided  Yes    Education Details  exercise technique, HEP, goals    Person(s) Educated  Patient    Methods  Explanation;Demonstration;Verbal cues    Comprehension  Verbalized understanding;Returned demonstration       PT Short Term Goals - 11/26/17 1251      PT SHORT TERM GOAL #1   Title  Patient will be independent in HEP demonstrating successful between session carryover and improve  functional mobility and ease of completing ADLs.    Baseline  10/8: HEP given next session 11/11: HEP compliance    Time  2    Period  Weeks    Status  Achieved      PT SHORT TERM GOAL #2   Title  Patient will be able to transfer sit<>Stand from regular height chair without pushing  on arm rests to exhibit improved independence improved functional strength.     Baseline  10/8: BUE support 11/11: hands on knees    Time  2    Period  Weeks    Status  Achieved        PT Long Term Goals - 11/26/17 1039      PT LONG TERM GOAL #1   Title  Patient will ambulate >1013f with rollator during 61m demonstrating improved community ambulation and ease with shopping    Baseline  10/8: 84562f1/11: 890f53f Time  8    Period  Weeks    Status  Partially Met    Target Date  12/18/17      PT LONG TERM GOAL #2   Title  Patient (> 60 y37rs old) will complete five times sit to stand test in < 15 seconds indicating an increased LE strength and improved balance.    Baseline  10/8: 26seconds with BUE 11/11: 24 seconds BUE    Time  8    Period  Weeks    Status  Partially Met    Target Date  12/18/17      PT LONG TERM GOAL #3   Title  Patient will demonstrate an improved Berg Balance Score of >42/56 as to demonstrate improved balance with ADLs such as sitting/standing and transfer balance and reduced fall risk.     Baseline  10/8: 37/56 11/11: 40/56    Time  8    Period  Weeks    Status  Partially Met    Target Date  12/18/17      PT LONG TERM GOAL #4   Title  Patient will improve LEFS to 54/80 to demonstrate improved fucnctional strenth and mobility and ease of indepedence    Baseline  10/8: 45/80 11/11: 41/80    Time  8    Period  Weeks    Status  Partially Met    Target Date  12/18/17      PT LONG TERM GOAL #5   Title  Patient will be able to load and unload rollator indepedently to improve independence and quality of life.     Baseline  10/8: needs assistance to put rollator in and out  of car 11/11: requires assistance    Time  8    Period  Weeks    Status  On-going    Target Date  12/18/17            Plan - 11/26/17 1255    Clinical Impression Statement  Patient demonstrated progress towards all goals. Patient has improved lower extremity strength demonstrated by ability to complete sit to stand without use of arm rests on chair. Patient improved 6mwt69mom 845ft 66f90ft d56fstrating improving community ambulation and ambulation distance  with rollator. Patient has also improved BERG balance score but still has difficulty with toe taps and tandem stance remaining a fall risk.  Patient's condition has the potential to improve in response to therapy. Maximum improvement is yet to be obtained. The anticipated improvement is attainable and reasonable in a generally predictable time.Patient will continue to benefit from skilled physical therapy to improve strength, balance, and functional mobility.     Rehab Potential  Good    Clinical Impairments Affecting Rehab Potential  (+) improvement made within last year, eagerness to participate (-) HEP compliance, safety awareness     PT Frequency  2x / week    PT Duration  8 weeks  PT Treatment/Interventions  ADLs/Self Care Home Management;Cryotherapy;Electrical Stimulation;Iontophoresis 63m/ml Dexamethasone;Moist Heat;Ultrasound;DME Instruction;Gait training;Stair training;Balance training;Therapeutic exercise;Therapeutic activities;Functional mobility training;Neuromuscular re-education;Patient/family education;Manual techniques;Passive range of motion;Energy conservation    PT Next Visit Plan  ambulate without rollator    PT Home Exercise Plan  sit to stands, standing hip abduction, standing marches, tandem stance    Consulted and Agree with Plan of Care  Patient       Patient will benefit from skilled therapeutic intervention in order to improve the following deficits and impairments:  Abnormal gait, Decreased activity  tolerance, Decreased balance, Decreased coordination, Decreased endurance, Decreased mobility, Decreased range of motion, Decreased safety awareness, Decreased strength, Difficulty walking, Impaired flexibility, Impaired perceived functional ability, Improper body mechanics, Postural dysfunction, Pain, Impaired UE functional use, Decreased knowledge of precautions  Visit Diagnosis: Unsteadiness on feet  Muscle weakness (generalized)  Neurologic gait disorder     Problem List Patient Active Problem List   Diagnosis Date Noted  . Cognitive deficits   . Labile blood pressure   . Acute blood loss anemia   . Slow transit constipation   . Vascular headache   . 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 Hydrocephalus ([place] 07/03/2017  . 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 Pelvic fracture ([place] 04/18/2016  . CD (celiac disease) 08/04/2014  . Cancer of upper lobe of right lung ([place] 08/04/2014  . Age related osteoporosis 11/26/2013  . Absolute anemia 05/21/2013  . H/O malignant neoplasm 05/21/2013  . HLD (hyperlipidemia) 05/21/2013   AErick Blinks SPT  This entire session was performed under direct supervision and direction of a licensed therapist/therapist assistant . I have personally read, edited and approve of the note as written.   MJanna Arch PT, DPT   11/26/2017, 1:11 PM  [place] [facility] [address] [address]Phone: [phone]  Fax:  [phone] Name: MVIRGINIE JOSTENMRN: [id number] of Birth: 304/27/1947

## 2017-11-28 ENCOUNTER — Ambulatory Visit: Payer: PPO

## 2017-11-28 DIAGNOSIS — R2681 Unsteadiness on feet: Secondary | ICD-10-CM

## 2017-11-28 DIAGNOSIS — R269 Unspecified abnormalities of gait and mobility: Secondary | ICD-10-CM

## 2017-11-28 DIAGNOSIS — M6281 Muscle weakness (generalized): Secondary | ICD-10-CM

## 2017-11-28 NOTE — Therapy (Addendum)
[place] MAIN [facility] SERVICES [address] [place], [place], [address] Phone: [phone]   Fax:  [phone]  Physical Therapy Treatment  Patient Details  Name: Krista Evans MRN: [id number] Date of Birth: [birth year] Referring Provider (PT): Ramonita Lab   Encounter Date: 11/28/2017  PT End of Session - 11/28/17 1124    Visit Number  11    Number of Visits  17    Date for PT Re-Evaluation  12/18/17    Authorization Type  1/10 PN begin 11/11    PT Start Time  1031    PT Stop Time  1115    PT Time Calculation (min)  44 min    Equipment Utilized During Treatment  Gait belt    Activity Tolerance  Patient tolerated treatment well    Behavior During Therapy  WFL for tasks assessed/performed       Past Medical History:  Diagnosis Date  . Abnormal Q waves on electrocardiogram   . Anemia   . Aortic atherosclerosis ([place])    "I could possibly have it, my grandmother had it"  . Cancer [facility]) 05/2011   Right upper Lung CA with partial Lobectomy.  . Celiac disease   . Celiac disease   . Dyspnea    with exertion  . Hyperlipidemia   . Hyperlipidemia   . Lung mass   . Meningioma ([place])   . Osteoporosis     Past Surgical History:  Procedure Laterality Date  . LUNG LOBECTOMY     right lung  . 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  Subjective Assessment - 11/28/17 1034    Subjective  Patient states she is doing well. States she went shopping with grandkids after session on Monday and walked alot. Denies any falls or LOB since last visit . HEP compliance.     Pertinent History  Patient stated she received therapy here about a year ago and went home and did not do a lot of exercise. Patient reports around June 2019 she had an MRI which showed  enlarged ventricles and hydrocephalus and shunt was placed. Spent about a week and half in the hospital where she also received therapy. Also received home health PT and OT for about 3 weeks when discharged from hospital but has not done her exercises since. States she is not aware of any shunt precautions but feels the shunt which is described as a sharp pain when she lays late and leans forward. Reports her doctor is currently unaware of these symptoms. Patient states she walks around the house daily and 1x week she walks down the street and back with husband. Patient reports L leg aching after she begins walking to bedroom and back with rollator. Reports this has been happening for a couple years. Patient would like to get stronger, improve balance, and become more independent. Currently requires assistance getting rollator in and out of car.      Limitations  Lifting;Standing;Walking;House hold activities    How long can you sit comfortably?  n/a    How long can you stand comfortably?  5 minutes    How long can you walk comfortably?  limited but patient unsure of duration     Patient Stated Goals  improve balance, strength, be more independent     Currently in Pain?  No/denies          Nustep lvl 2 4 minutes >60rpm for cardiovascular challenge  Ambulate in hallway without rollator 8x39f. Verbal cues to maintain wide BOS and pick up feet. Verbal cues to weight shift onto each LE. 1 LOB CGA  Leg press 2x12 105lbs. Verbal cues to complete slowly and not let knees snap back. Cues to keep knees from coming together. Focus on equal force through each LE   In //bars: Standing on airex pad with hands locked together drawing a clock for dynamic balance challenge. X2. 2nd set in narrow BOS. CGA  Toe taps on 4in step holding rainbow ball to prevent UE support. CGA. Occasional use of finger for stability    Stepping backwards over foam roller x10 each LE. 1 UE support. CGA  Standing tapping 3 cones  with LLE  1 UE support and CGA. Focus on LLE due to decreased motor coordination. Verbal cues to tap and turn to neutral before tapping next color. 1-2-3-4  step commands calling out specific color x44mutes   Seated hip abduction with RTB x15 3 second holds.                      PT Education - 11/28/17 1035    Education provided  Yes    Education Details  exercise technique, HEP    Person(s) Educated  Patient    Methods  Explanation;Demonstration;Verbal cues    Comprehension  Verbalized understanding;Returned demonstration       PT Short Term Goals - 11/26/17 1251      PT SHORT TERM GOAL #1   Title  Patient will be independent in HEP demonstrating successful between session carryover and improve functional mobility and ease of completing ADLs.    Baseline  10/8: HEP given next session 11/11: HEP compliance    Time  2    Period  Weeks    Status  Achieved      PT SHORT TERM GOAL #2   Title  Patient will be able to transfer sit<>Stand from regular height chair without pushing on arm rests to exhibit improved independence improved functional strength.     Baseline  10/8: BUE support 11/11: hands on knees    Time  2    Period  Weeks    Status  Achieved        PT Long Term Goals - 11/26/17 1039      PT LONG TERM GOAL #1   Title  Patient will ambulate >100040fith rollator during 6mw30memonstrating improved community ambulation and ease with shopping    Baseline  10/8: [id number]: 890ft 69fime  8    Period  Weeks    Status  Partially Met    Target Date  12/18/17      PT LONG TERM GOAL #2   Title  Patient (> 58 yea51 old) will complete five times sit to stand test in < 15 seconds indicating an increased LE strength and improved balance.    Baseline  10/8: 26seconds with BUE 11/11: 24 seconds BUE    Time  8    Period  Weeks    Status  Partially Met    Target Date  12/18/17      PT LONG TERM GOAL #3   Title  Patient will demonstrate an improved Berg  Balance Score of >42/56 as to demonstrate improved balance with ADLs such as sitting/standing and transfer balance and reduced fall  risk.     Baseline  10/8: 37/56 11/11: 40/56    Time  8    Period  Weeks    Status  Partially Met    Target Date  12/18/17      PT LONG TERM GOAL #4   Title  Patient will improve LEFS to 54/80 to demonstrate improved fucnctional strenth and mobility and ease of indepedence    Baseline  10/8: 45/80 11/11: 41/80    Time  8    Period  Weeks    Status  Partially Met    Target Date  12/18/17      PT LONG TERM GOAL #5   Title  Patient will be able to load and unload rollator indepedently to improve independence and quality of life.     Baseline  10/8: needs assistance to put rollator in and out of car 11/11: requires assistance    Time  8    Period  Weeks    Status  On-going    Target Date  12/18/17            Plan - 11/28/17 1121    Clinical Impression Statement  Patient tolerated session well. Introduced ambulating in hallway without assistive device to improve independence and household and community ambulation. Patient had 1 LOB due to decreased LLE foot clearance and requires CGA and cueing for increased step height. Patient completed toe taps without UE support demonstrating improved balance but remains fearful of LOB.  Patient will continue to benefit from skilled physical therapy to improve strength, balance, and functional mobility.     Rehab Potential  Good    Clinical Impairments Affecting Rehab Potential  (+) improvement made within last year, eagerness to participate (-) HEP compliance, safety awareness     PT Frequency  2x / week    PT Duration  8 weeks    PT Treatment/Interventions  ADLs/Self Care Home Management;Cryotherapy;Electrical Stimulation;Iontophoresis 110m/ml Dexamethasone;Moist Heat;Ultrasound;DME Instruction;Gait training;Stair training;Balance training;Therapeutic exercise;Therapeutic activities;Functional mobility  training;Neuromuscular re-education;Patient/family education;Manual techniques;Passive range of motion;Energy conservation    PT Next Visit Plan  ambulate without rollator    PT Home Exercise Plan  sit to stands, standing hip abduction, standing marches, tandem stance    Consulted and Agree with Plan of Care  Patient       Patient will benefit from skilled therapeutic intervention in order to improve the following deficits and impairments:  Abnormal gait, Decreased activity tolerance, Decreased balance, Decreased coordination, Decreased endurance, Decreased mobility, Decreased range of motion, Decreased safety awareness, Decreased strength, Difficulty walking, Impaired flexibility, Impaired perceived functional ability, Improper body mechanics, Postural dysfunction, Pain, Impaired UE functional use, Decreased knowledge of precautions  Visit Diagnosis: Unsteadiness on feet  Muscle weakness (generalized)  Neurologic gait disorder     Problem List Patient Active Problem List   Diagnosis Date Noted  . Cognitive deficits   . Labile blood pressure   . Acute blood loss anemia   . Slow transit constipation   . Vascular headache   . 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 Hydrocephalus ([place] 07/03/2017  . 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 Pelvic fracture ([place] 04/18/2016  . CD (celiac disease) 08/04/2014  . Cancer of [place] lobe of right lung ([place] 08/04/2014  . Age related osteoporosis 11/26/2013  . Absolute anemia 05/21/2013  . H/O malignant neoplasm 05/21/2013  . HLD (hyperlipidemia) 05/21/2013   AErick Blinks SPT  This entire session was performed under direct supervision and direction of a licensed therapist/therapist assistant .  I have personally read, edited and approve of the note as written.  Janna Arch, PT, DPT   11/28/2017, 12:32 PM  [place] MAIN [facility] SERVICES [address] [place], [place], [address] Phone:  [phone]   Fax:  [phone]  Name: Krista Evans MRN: [id number] Date of Birth: May 22, 1945

## 2017-12-03 ENCOUNTER — Ambulatory Visit: Payer: PPO | Admitting: Physical Therapy

## 2017-12-03 DIAGNOSIS — R2681 Unsteadiness on feet: Secondary | ICD-10-CM

## 2017-12-03 DIAGNOSIS — R269 Unspecified abnormalities of gait and mobility: Secondary | ICD-10-CM

## 2017-12-03 DIAGNOSIS — M6281 Muscle weakness (generalized): Secondary | ICD-10-CM

## 2017-12-03 NOTE — Therapy (Signed)
[place] MAIN [facility] SERVICES [address] [place], [place], [address] Phone: [phone]   Fax:  [phone]  Physical Therapy Treatment  Patient Details  Name: Krista Evans MRN: [id number] Date of Birth: 10/14/1945 Referring Provider (PT): Ramonita Lab   Encounter Date: 12/03/2017  PT End of Session - 12/03/17 1036    Visit Number  12    Number of Visits  17    Date for PT Re-Evaluation  12/18/17    Authorization Type  2/10 PN begin 11/11    PT Start Time  1030    PT Stop Time  1110    PT Time Calculation (min)  40 min    Equipment Utilized During Treatment  Gait belt    Activity Tolerance  Patient tolerated treatment well    Behavior During Therapy  WFL for tasks assessed/performed       Past Medical History:  Diagnosis Date  . Abnormal Q waves on electrocardiogram   . Anemia   . Aortic atherosclerosis ([place])    "I could possibly have it, my grandmother had it"  . Cancer [facility]) 05/2011   Right upper Lung CA with partial Lobectomy.  . Celiac disease   . Celiac disease   . Dyspnea    with exertion  . Hyperlipidemia   . Hyperlipidemia   . Lung mass   . Meningioma ([place])   . Osteoporosis     Past Surgical History:  Procedure Laterality Date  . LUNG LOBECTOMY     right lung  . 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  Subjective Assessment - 12/03/17 1034    Subjective  Patient states she is doing well and has nothing exciting to report since her last visit. No falls or LOB since last visit. Patient reports that she is doing her HEP at least once a day and finds it helpful.    Pertinent History  Patient stated she received therapy here about a year ago and went home and did not do a lot of exercise. Patient reports around June 2019  she had an MRI which showed enlarged ventricles and hydrocephalus and shunt was placed. Spent about a week and half in the hospital where she also received therapy. Also received home health PT and OT for about 3 weeks when discharged from hospital but has not done her exercises since. States she is not aware of any shunt precautions but feels the shunt which is described as a sharp pain when she lays late and leans forward. Reports her doctor is currently unaware of these symptoms. Patient states she walks around the house daily and 1x week she walks down the street and back with husband. Patient reports L leg aching after she begins walking to bedroom and back with rollator. Reports this has been happening for a couple years. Patient would like to get stronger, improve balance, and become more independent. Currently requires assistance getting rollator in and out of car.      Limitations  Lifting;Standing;Walking;House hold activities    How long can you sit comfortably?  n/a    How long can you stand comfortably?  5 minutes    How long can you walk comfortably?  limited but patient unsure of duration     Patient Stated Goals  improve balance, strength, be  more independent     Currently in Pain?  No/denies      TREATMENT Therapeutic Exercise Nustep L3,58mnutes >60rpm for cardiovascular challenge, VCs to maintain speed (history taken)  Leg press 1x8 135lbs.; 1x8 120lbs. Verbal cues to complete slowly and work within a controllable range of motion (limit hyperextension) with tactile cue to block hyperextension. Ball between knees. Focus on equal force through each LE  Neuromuscular Re-education In //bars: Standing on airex pad with hands locked together drawing a clock for dynamic balance challenge. X2. 2nd set in narrow BOS. CGA  Stepping back over 6 " hurdle x10 each leg, Emphasis on weight-shift to split 50-50 between BLE.   Toe taps on 6in step faded UE support. CGA. Patient expressed  fear of falling frequently and resorted to use of finger for stability and confidence.  Standing tapping 3 stepping stones LLE  1 UE support and CGA. Focus on LLE due to decreased motor coordination. Verbal cues to tap and turn to neutral before tapping next color. Calling out specific color.     PT Education - 12/03/17 1035    Education provided  Yes    Education Details  balance strategies, exercise technique, HEP    Person(s) Educated  Patient    Methods  Explanation;Demonstration;Verbal cues    Comprehension  Verbalized understanding;Returned demonstration       PT Short Term Goals - 11/26/17 1251      PT SHORT TERM GOAL #1   Title  Patient will be independent in HEP demonstrating successful between session carryover and improve functional mobility and ease of completing ADLs.    Baseline  10/8: HEP given next session 11/11: HEP compliance    Time  2    Period  Weeks    Status  Achieved      PT SHORT TERM GOAL #2   Title  Patient will be able to transfer sit<>Stand from regular height chair without pushing on arm rests to exhibit improved independence improved functional strength.     Baseline  10/8: BUE support 11/11: hands on knees    Time  2    Period  Weeks    Status  Achieved        PT Long Term Goals - 11/26/17 1039      PT LONG TERM GOAL #1   Title  Patient will ambulate >10038fwith rollator during 32m69mdemonstrating improved community ambulation and ease with shopping    Baseline  10/8: 845f29f/11: 890ft24fTime  8    Period  Weeks    Status  Partially Met    Target Date  12/18/17      PT LONG TERM GOAL #2   Title  Patient (> 60 ye66s old) will complete five times sit to stand test in < 15 seconds indicating an increased LE strength and improved balance.    Baseline  10/8: 26seconds with BUE 11/11: 24 seconds BUE    Time  8    Period  Weeks    Status  Partially Met    Target Date  12/18/17      PT LONG TERM GOAL #3   Title  Patient will demonstrate  an improved Berg Balance Score of >42/56 as to demonstrate improved balance with ADLs such as sitting/standing and transfer balance and reduced fall risk.     Baseline  10/8: 37/56 11/11: 40/56    Time  8    Period  Weeks    Status  Partially Met    Target Date  12/18/17      PT LONG TERM GOAL #4   Title  Patient will improve LEFS to 54/80 to demonstrate improved fucnctional strenth and mobility and ease of indepedence    Baseline  10/8: 45/80 11/11: 41/80    Time  8    Period  Weeks    Status  Partially Met    Target Date  12/18/17      PT LONG TERM GOAL #5   Title  Patient will be able to load and unload rollator indepedently to improve independence and quality of life.     Baseline  10/8: needs assistance to put rollator in and out of car 11/11: requires assistance    Time  8    Period  Weeks    Status  On-going    Target Date  12/18/17            Plan - 12/03/17 1400    Clinical Impression Statement  Patient presented to therapy with excellent motivation to participate but some apprehension when attempting dynamic balance activties. Patient relied on UE support throughout session to increase confidence and stability when performing standing activities. Patient continues to require CGA and VCs for foot clearance, stride length, and safety. Patient able to perform leg press with tactile cues for optimal alignment at higher resistance.  Patient will continue to benefit from skilled therapeutic intervention to address deficits in strength, balance, and mobility in order to decrease risk of falls and improve overall QOL.    Rehab Potential  Good    Clinical Impairments Affecting Rehab Potential  (+) improvement made within last year, eagerness to participate (-) HEP compliance, safety awareness     PT Frequency  2x / week    PT Duration  8 weeks    PT Treatment/Interventions  ADLs/Self Care Home Management;Cryotherapy;Electrical Stimulation;Iontophoresis 61m/ml Dexamethasone;Moist  Heat;Ultrasound;DME Instruction;Gait training;Stair training;Balance training;Therapeutic exercise;Therapeutic activities;Functional mobility training;Neuromuscular re-education;Patient/family education;Manual techniques;Passive range of motion;Energy conservation    PT Next Visit Plan  ambulate without rollator    PT Home Exercise Plan  sit to stands, standing hip abduction, standing marches, tandem stance    Consulted and Agree with Plan of Care  Patient       Patient will benefit from skilled therapeutic intervention in order to improve the following deficits and impairments:  Abnormal gait, Decreased activity tolerance, Decreased balance, Decreased coordination, Decreased endurance, Decreased mobility, Decreased range of motion, Decreased safety awareness, Decreased strength, Difficulty walking, Impaired flexibility, Impaired perceived functional ability, Improper body mechanics, Postural dysfunction, Pain, Impaired UE functional use, Decreased knowledge of precautions  Visit Diagnosis: Muscle weakness (generalized)  Neurologic gait disorder  Unsteadiness on feet     Problem List Patient Active Problem List   Diagnosis Date Noted  . Cognitive deficits   . Labile blood pressure   . Acute blood loss anemia   . Slow transit constipation   . Vascular headache   . 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 Hydrocephalus ([place] 07/03/2017  . 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 Pelvic fracture ([place] 04/18/2016  . CD (celiac disease) 08/04/2014  . Cancer of upper lobe of right lung ([place] 08/04/2014  . Age related osteoporosis 11/26/2013  . Absolute anemia 05/21/2013  . H/O malignant neoplasm 05/21/2013  . HLD (hyperlipidemia) 05/21/2013   KMyles GipPT, DPT #[id number]/18/2019, 2:04 PM  [place] [facility] [address] [place] [address]Phone: [phone]  Fax:  [phone]  Name: TAKYA VANDIVIER MRN:  [id number] Date of Birth: 1945/02/21

## 2017-12-05 ENCOUNTER — Ambulatory Visit: Payer: PPO

## 2017-12-05 DIAGNOSIS — M6281 Muscle weakness (generalized): Secondary | ICD-10-CM

## 2017-12-05 DIAGNOSIS — R2681 Unsteadiness on feet: Secondary | ICD-10-CM

## 2017-12-05 DIAGNOSIS — R269 Unspecified abnormalities of gait and mobility: Secondary | ICD-10-CM

## 2017-12-05 NOTE — Therapy (Addendum)
[place] MAIN [facility] SERVICES [address] [place], [place], [address] Phone: [phone]   Fax:  [phone]  Physical Therapy Treatment  Patient Details  Name: Krista Evans MRN: [id number] Date of Birth: [birth year] Referring Provider (PT): Ramonita Lab   Encounter Date: 12/05/2017  PT End of Session - 12/05/17 1123    Visit Number  13    Number of Visits  17    Date for PT Re-Evaluation  12/18/17    Authorization Type  3/10 PN begin 11/11    PT Start Time  1030    PT Stop Time  1115    PT Time Calculation (min)  45 min    Equipment Utilized During Treatment  Gait belt    Activity Tolerance  Patient tolerated treatment well    Behavior During Therapy  WFL for tasks assessed/performed       Past Medical History:  Diagnosis Date  . Abnormal Q waves on electrocardiogram   . Anemia   . Aortic atherosclerosis ([place])    "I could possibly have it, my grandmother had it"  . Cancer [facility]) 05/2011   Right upper Lung CA with partial Lobectomy.  . Celiac disease   . Celiac disease   . Dyspnea    with exertion  . Hyperlipidemia   . Hyperlipidemia   . Lung mass   . Meningioma ([place])   . Osteoporosis     Past Surgical History:  Procedure Laterality Date  . LUNG LOBECTOMY     right lung  . 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  Subjective Assessment - 12/05/17 1035    Subjective  Patient states she is doing well. States not falls or LOB since last session. Reports she hasn't done exercises as frequently lately.     Pertinent History  Patient stated she received therapy here about a year ago and went home and did not do a lot of exercise. Patient reports around June 2019 she had an MRI which showed enlarged ventricles and hydrocephalus and  shunt was placed. Spent about a week and half in the hospital where she also received therapy. Also received home health PT and OT for about 3 weeks when discharged from hospital but has not done her exercises since. States she is not aware of any shunt precautions but feels the shunt which is described as a sharp pain when she lays late and leans forward. Reports her doctor is currently unaware of these symptoms. Patient states she walks around the house daily and 1x week she walks down the street and back with husband. Patient reports L leg aching after she begins walking to bedroom and back with rollator. Reports this has been happening for a couple years. Patient would like to get stronger, improve balance, and become more independent. Currently requires assistance getting rollator in and out of car.      Limitations  Lifting;Standing;Walking;House hold activities    How long can you sit comfortably?  n/a    How long can you stand comfortably?  5 minutes    How long can you walk comfortably?  limited but patient unsure of duration     Patient Stated Goals  improve balance, strength, be more independent     Currently in Pain?  No/denies  Nustep lvl 3 4 minutes >60rpm for cardiovascular challenge. Verbal cues to maintain speed   Ambulate in hallway without rollator 8x44f. Verbal cues to maintain wide BOS and pick up feet specifically L. Verbal cues to weight shift onto each LE. No LOB CGA   Leg press 2x10 120lbs. Verbal cues to complete slowly and not let knees snap back. Cues to keep knees from coming together. Focus on equal force through each LE. Attempted 135 but unable to complete today  Sit to stands x10. 4 with hands on knees. Last 6 with 1 UE support due to fatigue. CGA   In //bars:   Toe taps on 4in step holding rainbow ball to prevent UE support. CGA. Occasional use of finger for stability    Stepping backwards over orange hurdle x10 each LE. 1 UE support. CGA   Standing  on airex pad bilateral ER with RTB for dynamic balance challenge   Standing tapping 3 stepping stones while standing on airex pad with LLE  1 UE support and CGA. Focus on LLE due to decreased motor coordination. Verbal cues to tap and turn to neutral before tapping next color. 1-2-3-4  step commands calling out specific color x452mutes                        PT Education - 12/05/17 1123    Education provided  Yes    Education Details  exercise technique, HEP compliance    Person(s) Educated  Patient    Methods  Explanation;Demonstration;Verbal cues    Comprehension  Verbalized understanding;Returned demonstration       PT Short Term Goals - 11/26/17 1251      PT SHORT TERM GOAL #1   Title  Patient will be independent in HEP demonstrating successful between session carryover and improve functional mobility and ease of completing ADLs.    Baseline  10/8: HEP given next session 11/11: HEP compliance    Time  2    Period  Weeks    Status  Achieved      PT SHORT TERM GOAL #2   Title  Patient will be able to transfer sit<>Stand from regular height chair without pushing on arm rests to exhibit improved independence improved functional strength.     Baseline  10/8: BUE support 11/11: hands on knees    Time  2    Period  Weeks    Status  Achieved        PT Long Term Goals - 11/26/17 1039      PT LONG TERM GOAL #1   Title  Patient will ambulate >100041fith rollator during 6mw12memonstrating improved community ambulation and ease with shopping    Baseline  10/8: 845ft18f11: 890ft 30fime  8    Period  Weeks    Status  Partially Met    Target Date  12/18/17      PT LONG TERM GOAL #2   Title  Patient (> 60 yea34 old) will complete five times sit to stand test in < 15 seconds indicating an increased LE strength and improved balance.    Baseline  10/8: 26seconds with BUE 11/11: 24 seconds BUE    Time  8    Period  Weeks    Status  Partially Met    Target Date   12/18/17      PT LONG TERM GOAL #3   Title  Patient will demonstrate an improved Berg Balance Score  of >42/56 as to demonstrate improved balance with ADLs such as sitting/standing and transfer balance and reduced fall risk.     Baseline  10/8: 37/56 11/11: 40/56    Time  8    Period  Weeks    Status  Partially Met    Target Date  12/18/17      PT LONG TERM GOAL #4   Title  Patient will improve LEFS to 54/80 to demonstrate improved fucnctional strenth and mobility and ease of indepedence    Baseline  10/8: 45/80 11/11: 41/80    Time  8    Period  Weeks    Status  Partially Met    Target Date  12/18/17      PT LONG TERM GOAL #5   Title  Patient will be able to load and unload rollator indepedently to improve independence and quality of life.     Baseline  10/8: needs assistance to put rollator in and out of car 11/11: requires assistance    Time  8    Period  Weeks    Status  On-going    Target Date  12/18/17            Plan - 12/05/17 1120    Clinical Impression Statement  Patient demonstrated improved ability to ambulate without rollator with no LOB and improved L LE foot clearance and gait mechanics. Patient required less cues to avoid UE support during standing toe taps demonstrating slight improvement in confidence although patient remains fearful and CGA during all dynamic balance activities. Progressed motor control exercise to standing on airex pad for further balance challenge. Patient educated about continuing HEP for continued improvement. Patient will continue to benefit from skilled physical therapy to improve strength, balance, and functional mobility.     Rehab Potential  Good    Clinical Impairments Affecting Rehab Potential  (+) improvement made within last year, eagerness to participate (-) HEP compliance, safety awareness     PT Frequency  2x / week    PT Duration  8 weeks    PT Treatment/Interventions  ADLs/Self Care Home Management;Cryotherapy;Electrical  Stimulation;Iontophoresis 89m/ml Dexamethasone;Moist Heat;Ultrasound;DME Instruction;Gait training;Stair training;Balance training;Therapeutic exercise;Therapeutic activities;Functional mobility training;Neuromuscular re-education;Patient/family education;Manual techniques;Passive range of motion;Energy conservation    PT Next Visit Plan  ambulate without rollator    PT Home Exercise Plan  sit to stands, standing hip abduction, standing marches, tandem stance    Consulted and Agree with Plan of Care  Patient       Patient will benefit from skilled therapeutic intervention in order to improve the following deficits and impairments:  Abnormal gait, Decreased activity tolerance, Decreased balance, Decreased coordination, Decreased endurance, Decreased mobility, Decreased range of motion, Decreased safety awareness, Decreased strength, Difficulty walking, Impaired flexibility, Impaired perceived functional ability, Improper body mechanics, Postural dysfunction, Pain, Impaired UE functional use, Decreased knowledge of precautions  Visit Diagnosis: Muscle weakness (generalized)  Neurologic gait disorder  Unsteadiness on feet     Problem List Patient Active Problem List   Diagnosis Date Noted  . Cognitive deficits   . Labile blood pressure   . Acute blood loss anemia   . Slow transit constipation   . Vascular headache   . 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 Hydrocephalus ([place] 07/03/2017  . 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 Pelvic fracture ([place] 04/18/2016  . CD (celiac disease) 08/04/2014  . Cancer of [place] lobe of right lung ([place] 08/04/2014  . Age related osteoporosis 11/26/2013  . Absolute anemia 05/21/2013  .  H/O malignant neoplasm 05/21/2013  . HLD (hyperlipidemia) 05/21/2013   Erick Blinks, SPT  This entire session was performed under direct supervision and direction of a licensed therapist/therapist assistant . I have personally read, edited and approve of the  note as written.  Janna Arch, PT, DPT   12/05/2017, 1:08 PM  [place] MAIN [facility] SERVICES [address] [place], [place], [address] Phone: [phone]   Fax:  [phone]  Name: Krista Evans MRN: [id number] Date of Birth: 1945/10/06

## 2017-12-10 ENCOUNTER — Ambulatory Visit: Payer: PPO

## 2017-12-10 DIAGNOSIS — R2681 Unsteadiness on feet: Secondary | ICD-10-CM | POA: Diagnosis not present

## 2017-12-10 DIAGNOSIS — R269 Unspecified abnormalities of gait and mobility: Secondary | ICD-10-CM

## 2017-12-10 DIAGNOSIS — M6281 Muscle weakness (generalized): Secondary | ICD-10-CM

## 2017-12-10 NOTE — Therapy (Addendum)
[place] MAIN [facility] SERVICES [address] [place], [place], [address] Phone: [phone]   Fax:  [phone]  Physical Therapy Treatment  Patient Details  Name: Krista Evans MRN: [id number] Date of Birth: [birth year] Referring Provider (PT): Ramonita Lab   Encounter Date: 12/10/2017  PT End of Session - 12/10/17 1140    Visit Number  14    Number of Visits  17    Date for PT Re-Evaluation  12/18/17    Authorization Type  4/10 PN begin 11/11    PT Start Time  1030    PT Stop Time  1116    PT Time Calculation (min)  46 min    Equipment Utilized During Treatment  Gait belt    Activity Tolerance  Patient tolerated treatment well    Behavior During Therapy  WFL for tasks assessed/performed       Past Medical History:  Diagnosis Date  . Abnormal Q waves on electrocardiogram   . Anemia   . Aortic atherosclerosis ([place])    "I could possibly have it, my grandmother had it"  . Cancer [facility]) 05/2011   Right upper Lung CA with partial Lobectomy.  . Celiac disease   . Celiac disease   . Dyspnea    with exertion  . Hyperlipidemia   . Hyperlipidemia   . Lung mass   . Meningioma ([place])   . Osteoporosis     Past Surgical History:  Procedure Laterality Date  . LUNG LOBECTOMY     right lung  . 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  Subjective Assessment - 12/10/17 1033    Subjective  Patient states she had a good weekend. Denies falls or LOB since previous session. Reports she is doing her exercises and that tandem stance is difficult.  States she has notices she uses her LLE more.     Pertinent History  Patient stated she received therapy here about a year ago and went home and did not do a lot of exercise. Patient reports around June 2019 she  had an MRI which showed enlarged ventricles and hydrocephalus and shunt was placed. Spent about a week and half in the hospital where she also received therapy. Also received home health PT and OT for about 3 weeks when discharged from hospital but has not done her exercises since. States she is not aware of any shunt precautions but feels the shunt which is described as a sharp pain when she lays late and leans forward. Reports her doctor is currently unaware of these symptoms. Patient states she walks around the house daily and 1x week she walks down the street and back with husband. Patient reports L leg aching after she begins walking to bedroom and back with rollator. Reports this has been happening for a couple years. Patient would like to get stronger, improve balance, and become more independent. Currently requires assistance getting rollator in and out of car.      Limitations  Lifting;Standing;Walking;House hold activities    How long can you sit comfortably?  n/a    How long can you stand comfortably?  5 minutes    How long can you walk comfortably?  limited but patient unsure of duration     Patient Stated Goals  improve balance, strength, be more independent  Currently in Pain?  No/denies       Octane fitness lvl 4 speed >40  for cardiovascular challenge. Verbal cues to maintain speed    Leg press 2x10 125lbs. Verbal cues to complete slowly and not let knees snap back. Cues to keep knees from coming together. Focus on equal force through each LE. 2nd set complete without tactile cueing with patient able to maintain apppriate technique.   Speed ladder in //bars placing one foot in each square. 1 UE support faded to no UE support. X10lengths of bars   Just outside //bars (1 bar in reach):  Step ups 4 in step 1 UE support. CGA. Cone placed in one hand to prevent holding on to bars  Lateral step ups 4in step 1 UE support. CGA. Cone placed in one hand to prevent holding on to bars.  Verbal cues to keep toes pointing forward and take large step to allow opposite foot placement.   Standing toe taps on 4in step holding rainbow ball to prevent UE support. CGA. Patient fearful outside of bars and progressed patient from LUE support, to finger tip support, to no UE support with 5 repetitions with LLE for each progression.   Standing on airex pad upright raises with 3 lb bar  for dynamic balance challenge  Standing balloon taps on airex pad in narrow BOS reaching in/out of BOS. Regular stance when tapping with LUE. CGA. x13mnutes   Standing tapping 3 cones while standing on airex pad with RLE and LLE  1 UE support and CGA. Focus on LLE due to decreased motor coordination. Verbal cues to tap and turn to neutral before tapping next color. 1-2-3-4  step commands calling out specific color x438mutes     .                      PT Education - 12/10/17 1027    Education provided  Yes    Education Details  exercise technique, HEP    Person(s) Educated  Patient    Methods  Explanation;Demonstration;Verbal cues    Comprehension  Verbalized understanding;Returned demonstration       PT Short Term Goals - 11/26/17 1251      PT SHORT TERM GOAL #1   Title  Patient will be independent in HEP demonstrating successful between session carryover and improve functional mobility and ease of completing ADLs.    Baseline  10/8: HEP given next session 11/11: HEP compliance    Time  2    Period  Weeks    Status  Achieved      PT SHORT TERM GOAL #2   Title  Patient will be able to transfer sit<>Stand from regular height chair without pushing on arm rests to exhibit improved independence improved functional strength.     Baseline  10/8: BUE support 11/11: hands on knees    Time  2    Period  Weeks    Status  Achieved        PT Long Term Goals - 11/26/17 1039      PT LONG TERM GOAL #1   Title  Patient will ambulate >100068fith rollator during 6mw25memonstrating  improved community ambulation and ease with shopping    Baseline  10/8: [id number]: 890ft 39fime  8    Period  Weeks    Status  Partially Met    Target Date  12/18/17      PT LONG TERM GOAL #2  Title  Patient (> 52 years old) will complete five times sit to stand test in < 15 seconds indicating an increased LE strength and improved balance.    Baseline  10/8: 26seconds with BUE 11/11: 24 seconds BUE    Time  8    Period  Weeks    Status  Partially Met    Target Date  12/18/17      PT LONG TERM GOAL #3   Title  Patient will demonstrate an improved Berg Balance Score of >42/56 as to demonstrate improved balance with ADLs such as sitting/standing and transfer balance and reduced fall risk.     Baseline  10/8: 37/56 11/11: 40/56    Time  8    Period  Weeks    Status  Partially Met    Target Date  12/18/17      PT LONG TERM GOAL #4   Title  Patient will improve LEFS to 54/80 to demonstrate improved fucnctional strenth and mobility and ease of indepedence    Baseline  10/8: 45/80 11/11: 41/80    Time  8    Period  Weeks    Status  Partially Met    Target Date  12/18/17      PT LONG TERM GOAL #5   Title  Patient will be able to load and unload rollator indepedently to improve independence and quality of life.     Baseline  10/8: needs assistance to put rollator in and out of car 11/11: requires assistance    Time  8    Period  Weeks    Status  On-going    Target Date  12/18/17            Plan - 12/10/17 1131    Clinical Impression Statement  Patient tolerated session well with progression of exercises outside of parallel bars to decrease reliance on bars for stability. Patient required gradual fading of UE support during standing toe taps due to fear of LOB. Patient had initial difficulty with ambulation in speed ladder due to decrease motor control and reciprocal gait pattern. Patient improved with repetition with ability to complete without UE support and minimal cues.  Patient will continue to benefit from skilled physical therapy to improve strength, balance, and functional mobility    Rehab Potential  Good    Clinical Impairments Affecting Rehab Potential  (+) improvement made within last year, eagerness to participate (-) HEP compliance, safety awareness     PT Frequency  2x / week    PT Duration  8 weeks    PT Treatment/Interventions  ADLs/Self Care Home Management;Cryotherapy;Electrical Stimulation;Iontophoresis 21m/ml Dexamethasone;Moist Heat;Ultrasound;DME Instruction;Gait training;Stair training;Balance training;Therapeutic exercise;Therapeutic activities;Functional mobility training;Neuromuscular re-education;Patient/family education;Manual techniques;Passive range of motion;Energy conservation    PT Next Visit Plan  ambulate without rollator    PT Home Exercise Plan  sit to stands, standing hip abduction, standing marches, tandem stance    Consulted and Agree with Plan of Care  Patient       Patient will benefit from skilled therapeutic intervention in order to improve the following deficits and impairments:  Abnormal gait, Decreased activity tolerance, Decreased balance, Decreased coordination, Decreased endurance, Decreased mobility, Decreased range of motion, Decreased safety awareness, Decreased strength, Difficulty walking, Impaired flexibility, Impaired perceived functional ability, Improper body mechanics, Postural dysfunction, Pain, Impaired UE functional use, Decreased knowledge of precautions  Visit Diagnosis: Muscle weakness (generalized)  Neurologic gait disorder  Unsteadiness on feet     Problem List Patient Active Problem List  Diagnosis Date Noted  . Cognitive deficits   . Labile blood pressure   . Acute blood loss anemia   . Slow transit constipation   . Vascular headache   . 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 Hydrocephalus ([place]) 07/03/2017  . 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 Pelvic fracture ([place])  04/18/2016  . CD (celiac disease) 08/04/2014  . Cancer of upper lobe of right lung ([place]) 08/04/2014  . Age related osteoporosis 11/26/2013  . Absolute anemia 05/21/2013  . H/O malignant neoplasm 05/21/2013  . HLD (hyperlipidemia) 05/21/2013   Erick Blinks, SPT  This entire session was performed under direct supervision and direction of a licensed therapist/therapist assistant . I have personally read, edited and approve of the note as written.  Janna Arch, PT, DPT   12/10/2017, 12:34 PM  [place] MAIN [facility] SERVICES [address] [place], [place], [address] Phone: [phone]   Fax:  [phone]  Name: Krista Evans MRN: [id number] Date of Birth: Aug 01, 1945

## 2017-12-12 ENCOUNTER — Ambulatory Visit: Payer: PPO

## 2017-12-12 DIAGNOSIS — R269 Unspecified abnormalities of gait and mobility: Secondary | ICD-10-CM

## 2017-12-12 DIAGNOSIS — M6281 Muscle weakness (generalized): Secondary | ICD-10-CM

## 2017-12-12 DIAGNOSIS — R2681 Unsteadiness on feet: Secondary | ICD-10-CM | POA: Diagnosis not present

## 2017-12-12 NOTE — Therapy (Signed)
[place] MAIN [facility] SERVICES [address] [place], [place], [address] Phone: [phone]   Fax:  [phone]  Physical Therapy Treatment  Patient Details  Name: Krista Evans MRN: [id number] Date of Birth: [birth year] Referring Provider (PT): Ramonita Lab   Encounter Date: 12/12/2017  PT End of Session - 12/12/17 1106    Visit Number  15    Number of Visits  17    Date for PT Re-Evaluation  12/18/17    Authorization Type  5/10 PN begin 11/11    PT Start Time  1030    PT Stop Time  1114    PT Time Calculation (min)  44 min    Equipment Utilized During Treatment  Gait belt    Activity Tolerance  Patient tolerated treatment well    Behavior During Therapy  WFL for tasks assessed/performed       Past Medical History:  Diagnosis Date  . Abnormal Q waves on electrocardiogram   . Anemia   . Aortic atherosclerosis ([place])    "I could possibly have it, my grandmother had it"  . Cancer [facility]) 05/2011   Right upper Lung CA with partial Lobectomy.  . Celiac disease   . Celiac disease   . Dyspnea    with exertion  . Hyperlipidemia   . Hyperlipidemia   . Lung mass   . Meningioma ([place])   . Osteoporosis     Past Surgical History:  Procedure Laterality Date  . LUNG LOBECTOMY     right lung  . 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  Subjective Assessment - 12/12/17 1033    Subjective  Patient reports no stumbles or falls since last session. Has 3 steps inside her house, usually does her right, and now she is able to start with her left now.     Pertinent History  Patient stated she received therapy here about a year ago and went home and did not do a lot of exercise. Patient reports around June 2019 she had an MRI which showed enlarged ventricles  and hydrocephalus and shunt was placed. Spent about a week and half in the hospital where she also received therapy. Also received home health PT and OT for about 3 weeks when discharged from hospital but has not done her exercises since. States she is not aware of any shunt precautions but feels the shunt which is described as a sharp pain when she lays late and leans forward. Reports her doctor is currently unaware of these symptoms. Patient states she walks around the house daily and 1x week she walks down the street and back with husband. Patient reports L leg aching after she begins walking to bedroom and back with rollator. Reports this has been happening for a couple years. Patient would like to get stronger, improve balance, and become more independent. Currently requires assistance getting rollator in and out of car.      Limitations  Lifting;Standing;Walking;House hold activities    How long can you sit comfortably?  n/a    How long can you stand comfortably?  5 minutes    How long can you walk comfortably?  limited but patient unsure of duration     Patient Stated Goals  improve balance, strength, be more independent     Currently in Pain?  No/denies        Nustep lvl 71mnutes >60rpm for cardiovascular challenge. Verbal cues to maintain speed  Leg press 2x10 125lbs. Verbal cues to complete slowly and not let knees snap back. Cues to keep knees from coming together. Focus on equal force through each LE. 2nd set complete without tactile cueing with patient able to maintain apppriate technique.     In // bars:  2lb ankle weights:    Marching BUE support to SUE support 10x each LE  Large step back/extension 10x each LE BUE support  SLR 10x each LE BUE support  Abduciton 10x each LE BUE support, cues for keeping toes forward.   4" step step down toe taps.; BUE support decrease to modified double hand, 12x each LE,    Step ups 4 in step 1 UE support. CGA. Cone placed in one hand to  prevent holding on to bars    Standing toe taps on 4in step holding rainbow ball to prevent UE support. CGA. Patient fearful outside of bars and progressed patient from LUE support, to finger tip support, to no UE support with 5 repetitions with LLE for each progression.   Standing on airex pad upright raises with 3 lb bar  for dynamic balance challenge    Seated 2lb ankle weights:    LAQ 10x                       PT Education - 12/12/17 1034    Education provided  Yes    Education Details  exercise technique, step downs    Person(s) Educated  Patient    Methods  Explanation;Demonstration;Verbal cues    Comprehension  Verbalized understanding;Returned demonstration       PT Short Term Goals - 11/26/17 1251      PT SHORT TERM GOAL #1   Title  Patient will be independent in HEP demonstrating successful between session carryover and improve functional mobility and ease of completing ADLs.    Baseline  10/8: HEP given next session 11/11: HEP compliance    Time  2    Period  Weeks    Status  Achieved      PT SHORT TERM GOAL #2   Title  Patient will be able to transfer sit<>Stand from regular height chair without pushing on arm rests to exhibit improved independence improved functional strength.     Baseline  10/8: BUE support 11/11: hands on knees    Time  2    Period  Weeks    Status  Achieved        PT Long Term Goals - 11/26/17 1039      PT LONG TERM GOAL #1   Title  Patient will ambulate >10071fwith rollator during 33m27mdemonstrating improved community ambulation and ease with shopping    Baseline  10/8: 845f51f/11: 890ft48fTime  8    Period  Weeks    Status  Partially Met    Target Date  12/18/17      PT LONG TERM GOAL #2   Title  Patient (> 60 ye23s old) will complete five times sit to stand test in < 15 seconds indicating an increased LE strength and improved balance.    Baseline  10/8: 26seconds with BUE 11/11: 24 seconds BUE    Time  8     Period  Weeks    Status  Partially Met    Target Date  12/18/17  PT LONG TERM GOAL #3   Title  Patient will demonstrate an improved Berg Balance Score of >42/56 as to demonstrate improved balance with ADLs such as sitting/standing and transfer balance and reduced fall risk.     Baseline  10/8: 37/56 11/11: 40/56    Time  8    Period  Weeks    Status  Partially Met    Target Date  12/18/17      PT LONG TERM GOAL #4   Title  Patient will improve LEFS to 54/80 to demonstrate improved fucnctional strenth and mobility and ease of indepedence    Baseline  10/8: 45/80 11/11: 41/80    Time  8    Period  Weeks    Status  Partially Met    Target Date  12/18/17      PT LONG TERM GOAL #5   Title  Patient will be able to load and unload rollator indepedently to improve independence and quality of life.     Baseline  10/8: needs assistance to put rollator in and out of car 11/11: requires assistance    Time  8    Period  Weeks    Status  On-going    Target Date  12/18/17            Plan - 12/12/17 1108    Clinical Impression Statement  Patient is fearful of performing stability interventions/standing interventions with decreased UE assistance. Her stability improves with repetition. Patient more challenged with technique and motor coordination and recruitment of LLE with noted shaking when fatigued. Patient will continue to benefit from skilled physical therapy to improve strength, balance, and functional mobility    Rehab Potential  Good    Clinical Impairments Affecting Rehab Potential  (+) improvement made within last year, eagerness to participate (-) HEP compliance, safety awareness     PT Frequency  2x / week    PT Duration  8 weeks    PT Treatment/Interventions  ADLs/Self Care Home Management;Cryotherapy;Electrical Stimulation;Iontophoresis 71m/ml Dexamethasone;Moist Heat;Ultrasound;DME Instruction;Gait training;Stair training;Balance training;Therapeutic exercise;Therapeutic  activities;Functional mobility training;Neuromuscular re-education;Patient/family education;Manual techniques;Passive range of motion;Energy conservation    PT Next Visit Plan  ambulate without rollator    PT Home Exercise Plan  sit to stands, standing hip abduction, standing marches, tandem stance    Consulted and Agree with Plan of Care  Patient       Patient will benefit from skilled therapeutic intervention in order to improve the following deficits and impairments:  Abnormal gait, Decreased activity tolerance, Decreased balance, Decreased coordination, Decreased endurance, Decreased mobility, Decreased range of motion, Decreased safety awareness, Decreased strength, Difficulty walking, Impaired flexibility, Impaired perceived functional ability, Improper body mechanics, Postural dysfunction, Pain, Impaired UE functional use, Decreased knowledge of precautions  Visit Diagnosis: Muscle weakness (generalized)  Neurologic gait disorder  Unsteadiness on feet     Problem List Patient Active Problem List   Diagnosis Date Noted  . Cognitive deficits   . Labile blood pressure   . Acute blood loss anemia   . Slow transit constipation   . Vascular headache   . 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 [place] 07[place]  . [place]  . [place] 04/18[place]2016  . CD (celiac disease) 08/04/2014  . [place] 08/04/2014  . Age related osteoporosis 11/26/2013  . Absolute anemia 05/21/2013  . H/O malignant neoplasm 05/21/2013  . HLD (hyperlipidemia) 05/21/2013   MJanna Arch PT, DPT  12/12/2017, 11:15 AM  [place] MAIN [facility] SERVICES [address] [place], [place], [address] Phone: [phone]   Fax:  [phone]  Name: TZIPPORAH NAGORSKI MRN: [id number] Date of Birth: [birth year]

## 2017-12-17 ENCOUNTER — Ambulatory Visit: Payer: PPO | Attending: Internal Medicine

## 2017-12-17 DIAGNOSIS — M6281 Muscle weakness (generalized): Secondary | ICD-10-CM | POA: Insufficient documentation

## 2017-12-17 DIAGNOSIS — R269 Unspecified abnormalities of gait and mobility: Secondary | ICD-10-CM | POA: Insufficient documentation

## 2017-12-17 DIAGNOSIS — R2681 Unsteadiness on feet: Secondary | ICD-10-CM

## 2017-12-17 NOTE — Therapy (Signed)
[place] MAIN [facility] SERVICES [address] [place], [place], [address] Phone: [phone]   Fax:  [phone]  Physical Therapy Treatment Physical Therapy Progress Note/ RECERT   Dates of reporting period  11/26/17  to   12/17/17   Patient Details  Name: Krista Evans MRN: [id number] Date of Birth: Dec 03, 1945 Referring Provider (PT): Ramonita Lab   Encounter Date: 12/17/2017  PT End of Session - 12/17/17 1035    Visit Number  16    Number of Visits  32    Date for PT Re-Evaluation  12/18/17    Authorization Type  6/10 PN begin 11/11 (next 1/10 starting 12/2)     PT Start Time  1030    PT Stop Time  1114    PT Time Calculation (min)  44 min    Equipment Utilized During Treatment  Gait belt    Activity Tolerance  Patient tolerated treatment well    Behavior During Therapy  WFL for tasks assessed/performed       Past Medical History:  Diagnosis Date  . Abnormal Q waves on electrocardiogram   . Anemia   . Aortic atherosclerosis ([place])    "I could possibly have it, my grandmother had it"  . Cancer [facility]) 05/2011   Right upper Lung CA with partial Lobectomy.  . Celiac disease   . Celiac disease   . Dyspnea    with exertion  . Hyperlipidemia   . Hyperlipidemia   . Lung mass   . Meningioma ([place])   . Osteoporosis     Past Surgical History:  Procedure Laterality Date  . LUNG LOBECTOMY     right lung  . 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  Subjective Assessment - 12/17/17 1033    Subjective  Patient reports no falls since LOB.  Reports she has had some compliance with HEP but was difficult for the holidays with family visiting. Is normally compliant every day.     Pertinent History  Patient stated she received therapy here about a  year ago and went home and did not do a lot of exercise. Patient reports around June 2019 she had an MRI which showed enlarged ventricles and hydrocephalus and shunt was placed. Spent about a week and half in the hospital where she also received therapy. Also received home health PT and OT for about 3 weeks when discharged from hospital but has not done her exercises since. States she is not aware of any shunt precautions but feels the shunt which is described as a sharp pain when she lays late and leans forward. Reports her doctor is currently unaware of these symptoms. Patient states she walks around the house daily and 1x week she walks down the street and back with husband. Patient reports L leg aching after she begins walking to bedroom and back with rollator. Reports this has been happening for a couple years. Patient would like to get stronger, improve balance, and become more independent. Currently requires assistance getting rollator in and out of car.      Limitations  Lifting;Standing;Walking;House hold activities    How long can you sit comfortably?  n/a    How long can you stand comfortably?  5 minutes    How long can you walk comfortably?  limited but patient unsure  of duration     Patient Stated Goals  improve balance, strength, be more independent     Currently in Pain?  No/denies       RECERT STS without UE support: able to perform 2x with hands on knees. ; CGA with verbal cueing for head over toes.  6 MWT: 1030 ft with rollator  5xSTS: 16 seconds SUE support  BERG: 43/56  LEFS: 45/80  Load and unload rollator   Nustep lvl 4 4 minutes >60rpm for cardiovascular challenge. Verbal cues to maintain speed      [facility] PT Assessment - 12/17/17 0001      Berg Balance Test   Sit to Stand  Able to stand  independently using hands    Standing Unsupported  Able to stand safely 2 minutes    Sitting with Back Unsupported but Feet Supported on Floor or Stool  Able to sit safely and  securely 2 minutes    Stand to Sit  Controls descent by using hands    Transfers  Able to transfer safely, definite need of hands    Standing Unsupported with Eyes Closed  Able to stand 10 seconds safely    Standing Ubsupported with Feet Together  Able to place feet together independently and stand 1 minute safely    From Standing, Reach Forward with Outstretched Arm  Can reach forward >12 cm safely (5")    From Standing Position, Pick up Object from Floor  Able to pick up shoe safely and easily    From Standing Position, Turn to Look Behind Over each Shoulder  Looks behind from both sides and weight shifts well    Turn 360 Degrees  Able to turn 360 degrees safely but slowly    Standing Unsupported, Alternately Place Feet on Step/Stool  Able to complete 4 steps without aid or supervision    Standing Unsupported, One Foot in Front  Able to take small step independently and hold 30 seconds    Standing on One Leg  Tries to lift leg/unable to hold 3 seconds but remains standing independently    Total Score  43      Patient's condition has the potential to improve in response to therapy. Maximum improvement is yet to be obtained. The anticipated improvement is attainable and reasonable in a generally predictable time.  Patient reports she is feeling more steady and able to ambulate better however continues to be fearful if she doesn't have something to hold onto.                        PT Education - 12/17/17 1035    Education provided  Yes    Education Details  goals, POC, exercise technique     Person(s) Educated  Patient    Methods  Explanation;Verbal cues;Demonstration    Comprehension  Verbalized understanding;Returned demonstration;Need further instruction       PT Short Term Goals - 12/17/17 1049      PT SHORT TERM GOAL #1   Title  Patient will be independent in HEP demonstrating successful between session carryover and improve functional mobility and ease of  completing ADLs.    Baseline  10/8: HEP given next session 11/11: HEP compliance    Time  2    Period  Weeks    Status  Achieved      PT SHORT TERM GOAL #2   Title  Patient will be able to transfer sit<>Stand from regular height chair without  pushing on arm rests to exhibit improved independence improved functional strength.     Baseline  10/8: BUE support 11/11: hands on knees    Time  2    Period  Weeks    Status  Achieved        PT Long Term Goals - 12/17/17 1050      PT LONG TERM GOAL #1   Title  Patient will ambulate >1027f with rollator during 677m demonstrating improved community ambulation and ease with shopping    Baseline  10/8: 84533f1/11: 890f23f/2: 1030 ft with rollator     Time  8    Period  Weeks    Status  Achieved      PT LONG TERM GOAL #2   Title  Patient (> 60 y61rs old) will complete five times sit to stand test in < 15 seconds indicating an increased LE strength and improved balance.    Baseline  10/8: 26seconds with BUE 11/11: 24 seconds BUE 12/2: 16 seconds with SUE support     Time  8    Period  Weeks    Status  Partially Met    Target Date  02/11/18      PT LONG TERM GOAL #3   Title  Patient will demonstrate an improved Berg Balance Score of >42/56 as to demonstrate improved balance with ADLs such as sitting/standing and transfer balance and reduced fall risk.     Baseline  10/8: 37/56 11/11: 40/56 12/2: 43/56     Time  8    Period  Weeks    Status  Achieved      PT LONG TERM GOAL #4   Title  Patient will improve LEFS to 54/80 to demonstrate improved fucnctional strenth and mobility and ease of indepedence    Baseline  10/8: 45/80 11/11: 41/80 12/2: 45/80     Time  8    Period  Weeks    Status  Partially Met    Target Date  02/11/18      PT LONG TERM GOAL #5   Title  Patient will be able to load and unload rollator indepedently to improve independence and quality of life.     Baseline  10/8: needs assistance to put rollator in and out of  car 11/11: requires assistance    Time  8    Period  Weeks    Status  On-going      Additional Long Term Goals   Additional Long Term Goals  Yes      PT LONG TERM GOAL #6   Title  Patient will demonstrate an improved Berg Balance Score of >49/56 as to demonstrate improved balance with ADLs such as sitting/standing and transfer balance and reduced fall risk.     Baseline  12/2: 43/56     Time  8    Period  Weeks    Status  New    Target Date  02/11/18      PT LONG TERM GOAL #7   Title  Patient will demonstrate ability to ambulate without AD 200 ft Independently to increase independence in mobility.     Baseline  12/2: requires rollator     Time  8    Period  Weeks    Status  New    Target Date  02/11/18            Plan - 12/17/17 1112    Clinical Impression Statement  Patient demonstrates progress towards her goals  with increased mobility and stability. Patient ambulated 1030 ft with rollator during 6 MWT and performed 5x STS with SUE support. Patient able to perform two sit to stands untimed with hands on knees and CGA with verbal cueing for head over toes. Patient stability increasing with BERG score of 43/56. Patient's condition has the potential to improve in response to therapy. Maximum improvement is yet to be obtained. The anticipated improvement is attainable and reasonable in a generally predictable time.Patient will continue to benefit from skilled physical therapy to improve strength, balance, and functional mobility    Rehab Potential  Good    Clinical Impairments Affecting Rehab Potential  (+) improvement made within last year, eagerness to participate (-) HEP compliance, safety awareness     PT Frequency  2x / week    PT Duration  8 weeks    PT Treatment/Interventions  ADLs/Self Care Home Management;Cryotherapy;Electrical Stimulation;Iontophoresis 19m/ml Dexamethasone;Moist Heat;Ultrasound;DME Instruction;Gait training;Stair training;Balance training;Therapeutic  exercise;Therapeutic activities;Functional mobility training;Neuromuscular re-education;Patient/family education;Manual techniques;Passive range of motion;Energy conservation    PT Next Visit Plan  ambulate without rollator    PT Home Exercise Plan  sit to stands, standing hip abduction, standing marches, tandem stance    Consulted and Agree with Plan of Care  Patient       Patient will benefit from skilled therapeutic intervention in order to improve the following deficits and impairments:  Abnormal gait, Decreased activity tolerance, Decreased balance, Decreased coordination, Decreased endurance, Decreased mobility, Decreased range of motion, Decreased safety awareness, Decreased strength, Difficulty walking, Impaired flexibility, Impaired perceived functional ability, Improper body mechanics, Postural dysfunction, Pain, Impaired UE functional use, Decreased knowledge of precautions  Visit Diagnosis: Muscle weakness (generalized)  Neurologic gait disorder  Unsteadiness on feet     Problem List Patient Active Problem List   Diagnosis Date Noted  . Cognitive deficits   . Labile blood pressure   . Acute blood loss anemia   . Slow transit constipation   . Vascular headache   . 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 Hydrocephalus ([place] 07/03/2017  . 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 Pelvic fracture ([place] 04/18/2016  . CD (celiac disease) 08/04/2014  . Cancer of upper lobe of right lung ([place] 08/04/2014  . Age related osteoporosis 11/26/2013  . Absolute anemia 05/21/2013  . H/O malignant neoplasm 05/21/2013  . HLD (hyperlipidemia) 05/21/2013   MJanna Arch PT, DPT   12/17/2017, 11:14 AM  [place] [facility] [address] [place] [address]Phone: [phone]  Fax:  [phone] Name: MCARRINA SCHOENBERGERMRN: [id number] of Birth: 302-10-1945

## 2017-12-19 ENCOUNTER — Ambulatory Visit: Payer: PPO

## 2017-12-19 DIAGNOSIS — D649 Anemia, unspecified: Secondary | ICD-10-CM | POA: Diagnosis not present

## 2017-12-19 DIAGNOSIS — R269 Unspecified abnormalities of gait and mobility: Secondary | ICD-10-CM

## 2017-12-19 DIAGNOSIS — M810 Age-related osteoporosis without current pathological fracture: Secondary | ICD-10-CM | POA: Diagnosis not present

## 2017-12-19 DIAGNOSIS — R2681 Unsteadiness on feet: Secondary | ICD-10-CM

## 2017-12-19 DIAGNOSIS — M6281 Muscle weakness (generalized): Secondary | ICD-10-CM

## 2017-12-19 DIAGNOSIS — E7849 Other hyperlipidemia: Secondary | ICD-10-CM | POA: Diagnosis not present

## 2017-12-19 NOTE — Therapy (Signed)
[place] MAIN [facility] SERVICES [address] [place], [place], [address] Phone: [phone]   Fax:  [phone]  Physical Therapy Treatment  Patient Details  Name: Krista Evans MRN: [id number] Date of Birth: 28-Jul-1945 Referring Provider (PT): Ramonita Lab   Encounter Date: 12/19/2017  PT End of Session - 12/19/17 1035    Visit Number  17    Number of Visits  32    Date for PT Re-Evaluation  02/11/18    Authorization Type  1/10 starting 12/2    PT Start Time  1030    PT Stop Time  1115    PT Time Calculation (min)  45 min    Equipment Utilized During Treatment  Gait belt    Activity Tolerance  Patient tolerated treatment well    Behavior During Therapy  [facility] for tasks assessed/performed       Past Medical History:  Diagnosis Date  . Abnormal Q waves on electrocardiogram   . Anemia   . Aortic atherosclerosis ([place])    "I could possibly have it, my grandmother had it"  . Cancer [facility]) 05/2011   Right upper Lung CA with partial Lobectomy.  . Celiac disease   . Celiac disease   . Dyspnea    with exertion  . Hyperlipidemia   . Hyperlipidemia   . Lung mass   . Meningioma ([place])   . Osteoporosis     Past Surgical History:  Procedure Laterality Date  . LUNG LOBECTOMY     right lung  . 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  Subjective Assessment - 12/19/17 1033    Subjective  Patient had her blood drawn today and has not eaten or drank this morning. Has been having a hard time using her L arm recently. No stumbles or falls.     Pertinent History  Patient stated she received therapy here about a year ago and went home and did not do a lot of exercise. Patient reports around June 2019 she had an MRI which showed enlarged ventricles and  hydrocephalus and shunt was placed. Spent about a week and half in the hospital where she also received therapy. Also received home health PT and OT for about 3 weeks when discharged from hospital but has not done her exercises since. States she is not aware of any shunt precautions but feels the shunt which is described as a sharp pain when she lays late and leans forward. Reports her doctor is currently unaware of these symptoms. Patient states she walks around the house daily and 1x week she walks down the street and back with husband. Patient reports L leg aching after she begins walking to bedroom and back with rollator. Reports this has been happening for a couple years. Patient would like to get stronger, improve balance, and become more independent. Currently requires assistance getting rollator in and out of car.      Limitations  Lifting;Standing;Walking;House hold activities    How long can you sit comfortably?  n/a    How long can you stand comfortably?  5 minutes    How long can you walk comfortably?  limited but patient unsure of duration     Patient Stated Goals  improve balance, strength, be more independent     Currently in Pain?  No/denies  Nustep lvl 3 4 minutes >60rpm for cardiovascular challenge. Verbal cues to maintain speed  Ambulate without AD in hallway with verbal cues for improving forward momentum 2x 86 ft and CGA.   Ambulate without AD in hallway with horizontal head turns to read cards 2x 86 ft. CGA, 2 episodes of LOB.     Leg press 2x10 125lbs. Verbal cues to complete slowly and not let knees snap back. Cues to keep knees from coming together. Focus on equal force through each LE. 2nd set complete without tactile cueing with patient able to maintain apppriate technique.    Leg press: single leg: 50 lb 2x12, verbal cues for abducting knees to promote neutral body alignment. LLE more challenged than RLE. ; cues for decreasing velocity for improved muscle  contraction.    In // bars:  Standing toe taps on 4in step holding rainbow ball to prevent UE support. CGA. 10x each LE   Standing on airex pad upright raises with 3 lb bar  for dynamic balance challenge  Airex pad: 3lb bar rows 15x no LOB.    Airex pad: balloon taps 2 minutes reaching inside and outside BOS                            PT Education - 12/19/17 1034    Education provided  Yes    Education Details  exercise technique, stability     Person(s) Educated  Patient    Methods  Explanation;Demonstration;Verbal cues    Comprehension  Verbalized understanding;Returned demonstration       PT Short Term Goals - 12/17/17 1049      PT SHORT TERM GOAL #1   Title  Patient will be independent in HEP demonstrating successful between session carryover and improve functional mobility and ease of completing ADLs.    Baseline  10/8: HEP given next session 11/11: HEP compliance    Time  2    Period  Weeks    Status  Achieved      PT SHORT TERM GOAL #2   Title  Patient will be able to transfer sit<>Stand from regular height chair without pushing on arm rests to exhibit improved independence improved functional strength.     Baseline  10/8: BUE support 11/11: hands on knees    Time  2    Period  Weeks    Status  Achieved        PT Long Term Goals - 12/17/17 1050      PT LONG TERM GOAL #1   Title  Patient will ambulate >1030f with rollator during 646m demonstrating improved community ambulation and ease with shopping    Baseline  10/8: 84552f1/11: 890f15f/2: 1030 ft with rollator     Time  8    Period  Weeks    Status  Achieved      PT LONG TERM GOAL #2   Title  Patient (> 58 y46rs old) will complete five times sit to stand test in < 15 seconds indicating an increased LE strength and improved balance.    Baseline  10/8: 26seconds with BUE 11/11: 24 seconds BUE 12/2: 16 seconds with SUE support     Time  8    Period  Weeks    Status  Partially Met     Target Date  02/11/18      PT LONG TERM GOAL #3   Title  Patient will demonstrate an improved BergOceanographer  Score of >42/56 as to demonstrate improved balance with ADLs such as sitting/standing and transfer balance and reduced fall risk.     Baseline  10/8: 37/56 11/11: 40/56 12/2: 43/56     Time  8    Period  Weeks    Status  Achieved      PT LONG TERM GOAL #4   Title  Patient will improve LEFS to 54/80 to demonstrate improved fucnctional strenth and mobility and ease of indepedence    Baseline  10/8: 45/80 11/11: 41/80 12/2: 45/80     Time  8    Period  Weeks    Status  Partially Met    Target Date  02/11/18      PT LONG TERM GOAL #5   Title  Patient will be able to load and unload rollator indepedently to improve independence and quality of life.     Baseline  10/8: needs assistance to put rollator in and out of car 11/11: requires assistance    Time  8    Period  Weeks    Status  On-going      Additional Long Term Goals   Additional Long Term Goals  Yes      PT LONG TERM GOAL #6   Title  Patient will demonstrate an improved Berg Balance Score of >49/56 as to demonstrate improved balance with ADLs such as sitting/standing and transfer balance and reduced fall risk.     Baseline  12/2: 43/56     Time  8    Period  Weeks    Status  New    Target Date  02/11/18      PT LONG TERM GOAL #7   Title  Patient will demonstrate ability to ambulate without AD 200 ft Independently to increase independence in mobility.     Baseline  12/2: requires rollator     Time  8    Period  Weeks    Status  New    Target Date  02/11/18            Plan - 12/19/17 1059    Clinical Impression Statement  Patient demonstrates stability ambulating smooth surface without Assistive Device. Patient challenged with obstacles, turning, and head movements without use of AD resulting in occasional LOB. Patient requires frequent water breaks due to thirst from blood tests today. Patient will continue  to benefit from skilled physical therapy to improve strength, balance, and functional mobility    Rehab Potential  Good    Clinical Impairments Affecting Rehab Potential  (+) improvement made within last year, eagerness to participate (-) HEP compliance, safety awareness     PT Frequency  2x / week    PT Duration  8 weeks    PT Treatment/Interventions  ADLs/Self Care Home Management;Cryotherapy;Electrical Stimulation;Iontophoresis 29m/ml Dexamethasone;Moist Heat;Ultrasound;DME Instruction;Gait training;Stair training;Balance training;Therapeutic exercise;Therapeutic activities;Functional mobility training;Neuromuscular re-education;Patient/family education;Manual techniques;Passive range of motion;Energy conservation    PT Next Visit Plan  ambulate without rollator    PT Home Exercise Plan  sit to stands, standing hip abduction, standing marches, tandem stance    Consulted and Agree with Plan of Care  Patient       Patient will benefit from skilled therapeutic intervention in order to improve the following deficits and impairments:  Abnormal gait, Decreased activity tolerance, Decreased balance, Decreased coordination, Decreased endurance, Decreased mobility, Decreased range of motion, Decreased safety awareness, Decreased strength, Difficulty walking, Impaired flexibility, Impaired perceived functional ability, Improper body mechanics, Postural dysfunction, Pain, Impaired  UE functional use, Decreased knowledge of precautions  Visit Diagnosis: Muscle weakness (generalized)  Neurologic gait disorder  Unsteadiness on feet     Problem List Patient Active Problem List   Diagnosis Date Noted  . Cognitive deficits   . Labile blood pressure   . Acute blood loss anemia   . Slow transit constipation   . Vascular headache   . 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 Hydrocephalus ([place]) 07/03/2017  . 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 Pelvic fracture ([place]) 04/18/2016  . CD (celiac  disease) 08/04/2014  . Cancer of upper lobe of right lung ([place]) 08/04/2014  . Age related osteoporosis 11/26/2013  . Absolute anemia 05/21/2013  . H/O malignant neoplasm 05/21/2013  . HLD (hyperlipidemia) 05/21/2013   Janna Arch, PT, DPT   12/19/2017, 11:14 AM  [place] MAIN [facility] SERVICES [address] [place], [place], [address] Phone: [phone]   Fax:  [phone]  Name: ILIANNA BOWN MRN: [id number] Date of Birth: [birth year]

## 2017-12-25 ENCOUNTER — Ambulatory Visit: Payer: PPO

## 2017-12-25 DIAGNOSIS — M6281 Muscle weakness (generalized): Secondary | ICD-10-CM | POA: Diagnosis not present

## 2017-12-25 DIAGNOSIS — R269 Unspecified abnormalities of gait and mobility: Secondary | ICD-10-CM

## 2017-12-25 DIAGNOSIS — R2681 Unsteadiness on feet: Secondary | ICD-10-CM

## 2017-12-25 NOTE — Therapy (Signed)
[place] MAIN [facility] SERVICES [address] [place], [place], [address] Phone: [phone]   Fax:  [phone]  Physical Therapy Treatment  Patient Details  Name: STEFFI NOVIELLO MRN: [id number] Date of Birth: 13-Jul-1945 Referring Provider (PT): Ramonita Lab   Encounter Date: 12/25/2017  PT End of Session - 12/25/17 1033    Visit Number  18    Number of Visits  32    Date for PT Re-Evaluation  02/11/18    Authorization Type  2/10 starting 12/2    PT Start Time  1029    PT Stop Time  1114    PT Time Calculation (min)  45 min    Equipment Utilized During Treatment  Gait belt    Activity Tolerance  Patient tolerated treatment well    Behavior During Therapy  [facility] for tasks assessed/performed       Past Medical History:  Diagnosis Date  . Abnormal Q waves on electrocardiogram   . Anemia   . Aortic atherosclerosis ([place])    "I could possibly have it, my grandmother had it"  . Cancer [facility]) 05/2011   Right upper Lung CA with partial Lobectomy.  . Celiac disease   . Celiac disease   . Dyspnea    with exertion  . Hyperlipidemia   . Hyperlipidemia   . Lung mass   . Meningioma ([place])   . Osteoporosis     Past Surgical History:  Procedure Laterality Date  . LUNG LOBECTOMY     right lung  . 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  Subjective Assessment - 12/25/17 1032    Subjective  Patient reports she has had a good morning so far. Has eaten before coming today. Reports no falls or LOB since last session. Is going to the doctor for her physical tomorrow.     Pertinent History  Patient stated she received therapy here about a year ago and went home and did not do a lot of exercise. Patient reports around June 2019 she had an MRI which showed  enlarged ventricles and hydrocephalus and shunt was placed. Spent about a week and half in the hospital where she also received therapy. Also received home health PT and OT for about 3 weeks when discharged from hospital but has not done her exercises since. States she is not aware of any shunt precautions but feels the shunt which is described as a sharp pain when she lays late and leans forward. Reports her doctor is currently unaware of these symptoms. Patient states she walks around the house daily and 1x week she walks down the street and back with husband. Patient reports L leg aching after she begins walking to bedroom and back with rollator. Reports this has been happening for a couple years. Patient would like to get stronger, improve balance, and become more independent. Currently requires assistance getting rollator in and out of car.      Limitations  Lifting;Standing;Walking;House hold activities    How long can you sit comfortably?  n/a    How long can you stand comfortably?  5 minutes    How long can you walk comfortably?  limited but patient unsure of duration     Patient Stated Goals  improve balance, strength, be more independent     Currently in  Pain?  No/denies        Nustep lvl 3 4 minutes >60rpm for cardiovascular challenge. Verbal cues to maintain speed   Ambulate without AD in hallway with verbal cues for improving forward momentum 2x 86 ft and CGA. Verbal cues and tactile cues for L arm swing   Ambulate without AD in hallway with horizontal head turns to read cards 2x 86 ft. CGA, 2 episodes of LOB. verbal cues for arm swing      Leg press 2x10 125lbs. Verbal cues to complete slowly and not let knees snap back. Cues to keep knees from coming together. Focus on equal force through each LE. 2nd set complete without tactile cueing with patient able to maintain apppriate technique.    Leg press: single leg: 60 lb 2x10, verbal cues for abducting knees to promote neutral body  alignment. LLE more challenged than RLE. ; cues for decreasing velocity for improved muscle contraction.    In // bars:  Step with opposite arm swing/reach for biomechanics with ambulation 10x each LE.    Standing toe taps on 6in step holding rainbow ball to prevent UE support. CGA. 10x each LE   Ambulate around gym scanning room to find cones for object negotiation and stability without AD.  8 minutes, reaching high and low for objects CGA. Patient requires cues for left arm swing and usage.                         PT Education - 12/25/17 1033    Education provided  Yes    Education Details  exercise technique, stability     Person(s) Educated  Patient    Methods  Explanation;Demonstration;Verbal cues    Comprehension  Verbalized understanding;Returned demonstration       PT Short Term Goals - 12/17/17 1049      PT SHORT TERM GOAL #1   Title  Patient will be independent in HEP demonstrating successful between session carryover and improve functional mobility and ease of completing ADLs.    Baseline  10/8: HEP given next session 11/11: HEP compliance    Time  2    Period  Weeks    Status  Achieved      PT SHORT TERM GOAL #2   Title  Patient will be able to transfer sit<>Stand from regular height chair without pushing on arm rests to exhibit improved independence improved functional strength.     Baseline  10/8: BUE support 11/11: hands on knees    Time  2    Period  Weeks    Status  Achieved        PT Long Term Goals - 12/17/17 1050      PT LONG TERM GOAL #1   Title  Patient will ambulate >1040f with rollator during 679m demonstrating improved community ambulation and ease with shopping    Baseline  10/8: 84577f1/11: 890f24f/2: 1030 ft with rollator     Time  8    Period  Weeks    Status  Achieved      PT LONG TERM GOAL #2   Title  Patient (> 58 y46rs old) will complete five times sit to stand test in < 15 seconds indicating an increased LE  strength and improved balance.    Baseline  10/8: 26seconds with BUE 11/11: 24 seconds BUE 12/2: 16 seconds with SUE support     Time  8    Period  Weeks  Status  Partially Met    Target Date  02/11/18      PT LONG TERM GOAL #3   Title  Patient will demonstrate an improved Berg Balance Score of >42/56 as to demonstrate improved balance with ADLs such as sitting/standing and transfer balance and reduced fall risk.     Baseline  10/8: 37/56 11/11: 40/56 12/2: 43/56     Time  8    Period  Weeks    Status  Achieved      PT LONG TERM GOAL #4   Title  Patient will improve LEFS to 54/80 to demonstrate improved fucnctional strenth and mobility and ease of indepedence    Baseline  10/8: 45/80 11/11: 41/80 12/2: 45/80     Time  8    Period  Weeks    Status  Partially Met    Target Date  02/11/18      PT LONG TERM GOAL #5   Title  Patient will be able to load and unload rollator indepedently to improve independence and quality of life.     Baseline  10/8: needs assistance to put rollator in and out of car 11/11: requires assistance    Time  8    Period  Weeks    Status  On-going      Additional Long Term Goals   Additional Long Term Goals  Yes      PT LONG TERM GOAL #6   Title  Patient will demonstrate an improved Berg Balance Score of >49/56 as to demonstrate improved balance with ADLs such as sitting/standing and transfer balance and reduced fall risk.     Baseline  12/2: 43/56     Time  8    Period  Weeks    Status  New    Target Date  02/11/18      PT LONG TERM GOAL #7   Title  Patient will demonstrate ability to ambulate without AD 200 ft Independently to increase independence in mobility.     Baseline  12/2: requires rollator     Time  8    Period  Weeks    Status  New    Target Date  02/11/18            Plan - 12/25/17 1103    Clinical Impression Statement  Patient is challenged with use of left arm swing with ambulation that improved with verbal and tactile  cueing. Patient's LE strength is improving with increased ability to perform prolonged muscle recruitment for functional capacity mobility such as ambulation.  Patient will continue to benefit from skilled physical therapy to improve strength, balance, and functional mobility    Rehab Potential  Good    Clinical Impairments Affecting Rehab Potential  (+) improvement made within last year, eagerness to participate (-) HEP compliance, safety awareness     PT Frequency  2x / week    PT Duration  8 weeks    PT Treatment/Interventions  ADLs/Self Care Home Management;Cryotherapy;Electrical Stimulation;Iontophoresis 87m/ml Dexamethasone;Moist Heat;Ultrasound;DME Instruction;Gait training;Stair training;Balance training;Therapeutic exercise;Therapeutic activities;Functional mobility training;Neuromuscular re-education;Patient/family education;Manual techniques;Passive range of motion;Energy conservation    PT Next Visit Plan  ambulate without rollator    PT Home Exercise Plan  sit to stands, standing hip abduction, standing marches, tandem stance    Consulted and Agree with Plan of Care  Patient       Patient will benefit from skilled therapeutic intervention in order to improve the following deficits and impairments:  Abnormal gait, Decreased  activity tolerance, Decreased balance, Decreased coordination, Decreased endurance, Decreased mobility, Decreased range of motion, Decreased safety awareness, Decreased strength, Difficulty walking, Impaired flexibility, Impaired perceived functional ability, Improper body mechanics, Postural dysfunction, Pain, Impaired UE functional use, Decreased knowledge of precautions  Visit Diagnosis: Muscle weakness (generalized)  Neurologic gait disorder  Unsteadiness on feet     Problem List Patient Active Problem List   Diagnosis Date Noted  . Cognitive deficits   . Labile blood pressure   . Acute blood loss anemia   . Slow transit constipation   . Vascular  headache   . 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 Hydrocephalus ([place]) 07/03/2017  . 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 Pelvic fracture ([place]) 04/18/2016  . CD (celiac disease) 08/04/2014  . Cancer of upper lobe of right lung ([place]) 08/04/2014  . Age related osteoporosis 11/26/2013  . Absolute anemia 05/21/2013  . H/O malignant neoplasm 05/21/2013  . HLD (hyperlipidemia) 05/21/2013   Janna Arch, PT, DPT   12/25/2017, 11:15 AM  [place] MAIN [facility] SERVICES [address] [place], [place], [address] Phone: [phone]   Fax:  [phone]  Name: KYMBERLI WIEGAND MRN: [id number] Date of Birth: 12/10/1945

## 2017-12-26 DIAGNOSIS — M810 Age-related osteoporosis without current pathological fracture: Secondary | ICD-10-CM | POA: Diagnosis not present

## 2017-12-26 DIAGNOSIS — K900 Celiac disease: Secondary | ICD-10-CM | POA: Diagnosis not present

## 2017-12-26 DIAGNOSIS — Z8781 Personal history of (healed) traumatic fracture: Secondary | ICD-10-CM | POA: Diagnosis not present

## 2017-12-26 DIAGNOSIS — Z0000 Encounter for general adult medical examination without abnormal findings: Secondary | ICD-10-CM | POA: Diagnosis not present

## 2017-12-26 DIAGNOSIS — D329 Benign neoplasm of meninges, unspecified: Secondary | ICD-10-CM | POA: Diagnosis not present

## 2017-12-26 DIAGNOSIS — E7849 Other hyperlipidemia: Secondary | ICD-10-CM | POA: Diagnosis not present

## 2017-12-26 DIAGNOSIS — D649 Anemia, unspecified: Secondary | ICD-10-CM | POA: Diagnosis not present

## 2017-12-26 DIAGNOSIS — I700 Atherosclerosis of aorta: Secondary | ICD-10-CM | POA: Diagnosis not present

## 2017-12-26 DIAGNOSIS — Z85118 Personal history of other malignant neoplasm of bronchus and lung: Secondary | ICD-10-CM | POA: Diagnosis not present

## 2017-12-26 DIAGNOSIS — Z23 Encounter for immunization: Secondary | ICD-10-CM | POA: Diagnosis not present

## 2017-12-26 DIAGNOSIS — G912 (Idiopathic) normal pressure hydrocephalus: Secondary | ICD-10-CM | POA: Diagnosis not present

## 2017-12-26 DIAGNOSIS — Z1211 Encounter for screening for malignant neoplasm of colon: Secondary | ICD-10-CM | POA: Diagnosis not present

## 2017-12-26 DIAGNOSIS — R778 Other specified abnormalities of plasma proteins: Secondary | ICD-10-CM | POA: Diagnosis not present

## 2017-12-27 ENCOUNTER — Ambulatory Visit: Payer: PPO

## 2017-12-27 ENCOUNTER — Other Ambulatory Visit: Payer: Self-pay | Admitting: Internal Medicine

## 2017-12-27 DIAGNOSIS — Z1231 Encounter for screening mammogram for malignant neoplasm of breast: Secondary | ICD-10-CM

## 2017-12-27 DIAGNOSIS — M6281 Muscle weakness (generalized): Secondary | ICD-10-CM

## 2017-12-27 DIAGNOSIS — R2681 Unsteadiness on feet: Secondary | ICD-10-CM

## 2017-12-27 DIAGNOSIS — R269 Unspecified abnormalities of gait and mobility: Secondary | ICD-10-CM

## 2017-12-27 NOTE — Therapy (Signed)
[facility] REHAB SERVICES [address] [place], [place], [address] Phone: [phone]   Fax:  [phone]  Physical Therapy Treatment  Patient Details  Name: Krista Evans MRN: [id number] Date of Birth: 11/29/1945 Referring Provider (PT): Bert Klein   Encounter Date: 12/27/2017  PT End of Session - 12/27/17 1348    Visit Number  19    Number of Visits  32    Date for PT Re-Evaluation  02/11/18    Authorization Type  3/10 starting 12/2    PT Start Time  1344    PT Stop Time  1430    PT Time Calculation (min)  46 min    Equipment Utilized During Treatment  Gait belt    Activity Tolerance  Patient tolerated treatment well    Behavior During Therapy  WFL for tasks assessed/performed       Past Medical History:  Diagnosis Date  . Abnormal Q waves on electrocardiogram   . Anemia   . Aortic atherosclerosis ([facility])    "I could possibly have it, my grandmother had it"  . Cancer ([facility]) 05/2011   Right upper Lung CA with partial Lobectomy.  . Celiac disease   . Celiac disease   . Dyspnea    with exertion  . Hyperlipidemia   . Hyperlipidemia   . Lung mass   . Meningioma ([facility])   . Osteoporosis     Past Surgical History:  Procedure Laterality Date  . LUNG LOBECTOMY     right lung  . 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  Subjective Assessment - 12/27/17 1346    Subjective  Patient reports she has gotten a booster shot yesterday for pnemonia and her arm is very sore. No stumbles or falls since last session. Didn't do her exercises since last session due to her doctor appointment.     Pertinent History  Patient stated she received therapy here about a year ago and went home and did not do a lot of exercise. Patient reports around June  2019 she had an MRI which showed enlarged ventricles and hydrocephalus and shunt was placed. Spent about a week and half in the hospital where she also received therapy. Also received home health PT and OT for about 3 weeks when discharged from hospital but has not done her exercises since. States she is not aware of any shunt precautions but feels the shunt which is described as a sharp pain when she lays late and leans forward. Reports her doctor is currently unaware of these symptoms. Patient states she walks around the house daily and 1x week she walks down the street and back with husband. Patient reports L leg aching after she begins walking to bedroom and back with rollator. Reports this has been happening for a couple years. Patient would like to get stronger, improve balance, and become more independent. Currently requires assistance getting rollator in and out of car.      Limitations  Lifting;Standing;Walking;House hold activities    How long can you sit comfortably?  n/a    How long can you stand comfortably?  5 minutes    How long can you walk comfortably?  limited but patient unsure of duration     Patient Stated Goals  improve balance, strength, be more independent       Currently in Pain?  No/denies       Nustep lvl 4 4 minutes >60rpm for cardiovascular challenge. Verbal cues to maintain speed   Ambulate without AD in hallway with verbal cues for improving forward momentum 800 ft with one seated rest break and CGA. Verbal cues and tactile cues for L arm swing      In // bars:  Airex pad: balloon taps inside and outside BOS 2 minutes.   Airex pad: reaching and clipping clothespins to basketball hoop line to promote reaching outside BOS while maintaining stability on unstable surface; 8 minutes  Seated: Kicking soccer ball reaching inside and outside BOS working on coordination and quadriceps strengthening with ankle stability. Patient improving with repetition: x 5 minutes  Sit  to stand from low plinth table pushing off knees to replicate positioning at home in restroom. x5 trials.                        PT Education - 12/27/17 1348    Education provided  Yes    Education Details  exercise technique, stability     Person(s) Educated  Patient    Methods  Explanation;Demonstration;Tactile cues;Verbal cues    Comprehension  Verbalized understanding;Returned demonstration;Tactile cues required;Verbal cues required       PT Short Term Goals - 12/17/17 1049      PT SHORT TERM GOAL #1   Title  Patient will be independent in HEP demonstrating successful between session carryover and improve functional mobility and ease of completing ADLs.    Baseline  10/8: HEP given next session 11/11: HEP compliance    Time  2    Period  Weeks    Status  Achieved      PT SHORT TERM GOAL #2   Title  Patient will be able to transfer sit<>Stand from regular height chair without pushing on arm rests to exhibit improved independence improved functional strength.     Baseline  10/8: BUE support 11/11: hands on knees    Time  2    Period  Weeks    Status  Achieved        PT Long Term Goals - 12/17/17 1050      PT LONG TERM GOAL #1   Title  Patient will ambulate >1000ft with rollator during 6mwt demonstrating improved community ambulation and ease with shopping    Baseline  10/8: 845ft 11/11: 890ft 12/2: 1030 ft with rollator     Time  8    Period  Weeks    Status  Achieved      PT LONG TERM GOAL #2   Title  Patient (> 60 years old) will complete five times sit to stand test in < 15 seconds indicating an increased LE strength and improved balance.    Baseline  10/8: 26seconds with BUE 11/11: 24 seconds BUE 12/2: 16 seconds with SUE support     Time  8    Period  Weeks    Status  Partially Met    Target Date  02/11/18      PT LONG TERM GOAL #3   Title  Patient will demonstrate an improved Berg Balance Score of >42/56 as to demonstrate improved balance  with ADLs such as sitting/standing and transfer balance and reduced fall risk.     Baseline  10/8: 37/56 11/11: 40/56 12/2: 43/56     Time  8    Period  Weeks    Status    Achieved      PT LONG TERM GOAL #4   Title  Patient will improve LEFS to 54/80 to demonstrate improved fucnctional strenth and mobility and ease of indepedence    Baseline  10/8: 45/80 11/11: 41/80 12/2: 45/80     Time  8    Period  Weeks    Status  Partially Met    Target Date  02/11/18      PT LONG TERM GOAL #5   Title  Patient will be able to load and unload rollator indepedently to improve independence and quality of life.     Baseline  10/8: needs assistance to put rollator in and out of car 11/11: requires assistance    Time  8    Period  Weeks    Status  On-going      Additional Long Term Goals   Additional Long Term Goals  Yes      PT LONG TERM GOAL #6   Title  Patient will demonstrate an improved Berg Balance Score of >49/56 as to demonstrate improved balance with ADLs such as sitting/standing and transfer balance and reduced fall risk.     Baseline  12/2: 43/56     Time  8    Period  Weeks    Status  New    Target Date  02/11/18      PT LONG TERM GOAL #7   Title  Patient will demonstrate ability to ambulate without AD 200 ft Independently to increase independence in mobility.     Baseline  12/2: requires rollator     Time  8    Period  Weeks    Status  New    Target Date  02/11/18            Plan - 12/27/17 1444    Clinical Impression Statement  Patient demonstrates improved stability with ambulation with increased arm swing with only one episode of LOB when passerby closely passed patient around a corner resulting in PT guiding patient into safe position. Patient's gait mechanics are improving as noted with improved capacity for functional mobility. Patient will continue to benefit from skilled physical therapy to improve strength, balance, and functional mobility    Rehab Potential  Good     Clinical Impairments Affecting Rehab Potential  (+) improvement made within last year, eagerness to participate (-) HEP compliance, safety awareness     PT Frequency  2x / week    PT Duration  8 weeks    PT Treatment/Interventions  ADLs/Self Care Home Management;Cryotherapy;Electrical Stimulation;Iontophoresis 4mg/ml Dexamethasone;Moist Heat;Ultrasound;DME Instruction;Gait training;Stair training;Balance training;Therapeutic exercise;Therapeutic activities;Functional mobility training;Neuromuscular re-education;Patient/family education;Manual techniques;Passive range of motion;Energy conservation    PT Next Visit Plan  ambulate without rollator    PT Home Exercise Plan  sit to stands, standing hip abduction, standing marches, tandem stance    Consulted and Agree with Plan of Care  Patient       Patient will benefit from skilled therapeutic intervention in order to improve the following deficits and impairments:  Abnormal gait, Decreased activity tolerance, Decreased balance, Decreased coordination, Decreased endurance, Decreased mobility, Decreased range of motion, Decreased safety awareness, Decreased strength, Difficulty walking, Impaired flexibility, Impaired perceived functional ability, Improper body mechanics, Postural dysfunction, Pain, Impaired UE functional use, Decreased knowledge of precautions  Visit Diagnosis: Muscle weakness (generalized)  Neurologic gait disorder  Unsteadiness on feet     Problem List Patient Active Problem List   Diagnosis Date Noted  . Cognitive deficits   .   Labile blood pressure   . Acute blood loss anemia   . Slow transit constipation   . Vascular headache   . 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 Hydrocephalus ([facility]) 07/03/2017  . 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 Pelvic fracture ([facility]) 04/18/2016  . CD (celiac disease) 08/04/2014  . Cancer of upper lobe of right lung ([facility]) 08/04/2014  . Age related osteoporosis 11/26/2013  .  Absolute anemia 05/21/2013  . H/O malignant neoplasm 05/21/2013  . HLD (hyperlipidemia) 05/21/2013   Marina Moser, PT, DPT   12/27/2017, 2:46 PM  [facility] MA[facility] REHAB SERVICES [address] [place], [place], [address] Phone: [phone]   Fax:  [phone]  Name: Adabella L Hafner MRN: [id number] Date of Birth: 09/17/1945   

## 2018-01-01 ENCOUNTER — Ambulatory Visit: Payer: PPO

## 2018-01-01 DIAGNOSIS — R2681 Unsteadiness on feet: Secondary | ICD-10-CM

## 2018-01-01 DIAGNOSIS — M6281 Muscle weakness (generalized): Secondary | ICD-10-CM | POA: Diagnosis not present

## 2018-01-01 DIAGNOSIS — R269 Unspecified abnormalities of gait and mobility: Secondary | ICD-10-CM

## 2018-01-01 NOTE — Therapy (Signed)
[place] MAIN [facility] SERVICES [address] [place], [place], [address] Phone: [phone]   Fax:  [phone]  Physical Therapy Treatment  Patient Details  Name: Krista Evans MRN: [id number] Date of Birth: 06-07-1945 Referring Provider (PT): Ramonita Lab   Encounter Date: 01/01/2018  PT End of Session - 01/01/18 1258    Visit Number  20    Number of Visits  32    Date for PT Re-Evaluation  02/11/18    Authorization Type  4/10 starting 12/2    PT Start Time  1259    PT Stop Time  1344    PT Time Calculation (min)  45 min    Equipment Utilized During Treatment  Gait belt    Activity Tolerance  Patient tolerated treatment well    Behavior During Therapy  [facility] for tasks assessed/performed       Past Medical History:  Diagnosis Date  . Abnormal Q waves on electrocardiogram   . Anemia   . Aortic atherosclerosis ([place])    "I could possibly have it, my grandmother had it"  . Cancer [facility]) 05/2011   Right upper Lung CA with partial Lobectomy.  . Celiac disease   . Celiac disease   . Dyspnea    with exertion  . Hyperlipidemia   . Hyperlipidemia   . Lung mass   . Meningioma ([place])   . Osteoporosis     Past Surgical History:  Procedure Laterality Date  . LUNG LOBECTOMY     right lung  . 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  Subjective Assessment - 01/01/18 1303    Subjective  Patient reports she had a relaxing weekend. Just cleaned house yesterday. Reports no stumbles or falls. No pain.     Pertinent History  Patient stated she received therapy here about a year ago and went home and did not do a lot of exercise. Patient reports around June 2019 she had an MRI which showed enlarged ventricles and hydrocephalus and shunt was placed. Spent  about a week and half in the hospital where she also received therapy. Also received home health PT and OT for about 3 weeks when discharged from hospital but has not done her exercises since. States she is not aware of any shunt precautions but feels the shunt which is described as a sharp pain when she lays late and leans forward. Reports her doctor is currently unaware of these symptoms. Patient states she walks around the house daily and 1x week she walks down the street and back with husband. Patient reports L leg aching after she begins walking to bedroom and back with rollator. Reports this has been happening for a couple years. Patient would like to get stronger, improve balance, and become more independent. Currently requires assistance getting rollator in and out of car.      Limitations  Lifting;Standing;Walking;House hold activities    How long can you sit comfortably?  n/a    How long can you stand comfortably?  5 minutes    How long can you walk comfortably?  limited but patient unsure of duration     Patient Stated Goals  improve balance, strength, be more independent     Currently in Pain?  No/denies       Nustep lvl 4 4 minutes >  60rpm for cardiovascular challenge. Verbal cues to maintain speed   Ambulate without AD in hallway with verbal cues for improving forward momentum 1100 ft with one seated rest break and CGA. Verbal cues and tactile cues for L arm swing. Utilization of LUE to pushing button for doors to open, frequent LOB when attempting to open door.      In // bars: Stop start: stoplight game to decrease loss of balance when initiating and terminating motion, x 3 minutes.   Airex pad: 3lb bar chest press. 10x, straight arm raises 10x    Airex pad: balloon taps inside and outside BOS 2 minutes.    Leg press2x10 105lbs. Verbal cues to complete slowly and not let knees snap back. Cues to keep knees from coming together. Focus on equal force through each LE.2nd set  complete without tactile cueing with patient able to maintain apppriate technique.                           PT Education - 01/01/18 1258    Education provided  Yes    Education Details  exercise technique, stability     Person(s) Educated  Patient    Methods  Explanation;Demonstration;Tactile cues;Verbal cues    Comprehension  Verbalized understanding;Returned demonstration;Need further instruction;Tactile cues required;Verbal cues required       PT Short Term Goals - 12/17/17 1049      PT SHORT TERM GOAL #1   Title  Patient will be independent in HEP demonstrating successful between session carryover and improve functional mobility and ease of completing ADLs.    Baseline  10/8: HEP given next session 11/11: HEP compliance    Time  2    Period  Weeks    Status  Achieved      PT SHORT TERM GOAL #2   Title  Patient will be able to transfer sit<>Stand from regular height chair without pushing on arm rests to exhibit improved independence improved functional strength.     Baseline  10/8: BUE support 11/11: hands on knees    Time  2    Period  Weeks    Status  Achieved        PT Long Term Goals - 12/17/17 1050      PT LONG TERM GOAL #1   Title  Patient will ambulate >1055f with rollator during 649m demonstrating improved community ambulation and ease with shopping    Baseline  10/8: 84529f1/11: 890f27f/2: 1030 ft with rollator     Time  8    Period  Weeks    Status  Achieved      PT LONG TERM GOAL #2   Title  Patient (> 58 y96rs old) will complete five times sit to stand test in < 15 seconds indicating an increased LE strength and improved balance.    Baseline  10/8: 26seconds with BUE 11/11: 24 seconds BUE 12/2: 16 seconds with SUE support     Time  8    Period  Weeks    Status  Partially Met    Target Date  02/11/18      PT LONG TERM GOAL #3   Title  Patient will demonstrate an improved Berg Balance Score of >42/56 as to demonstrate  improved balance with ADLs such as sitting/standing and transfer balance and reduced fall risk.     Baseline  10/8: 37/56 11/11: 40/56 12/2: 43/56     Time  8  Period  Weeks    Status  Achieved      PT LONG TERM GOAL #4   Title  Patient will improve LEFS to 54/80 to demonstrate improved fucnctional strenth and mobility and ease of indepedence    Baseline  10/8: 45/80 11/11: 41/80 12/2: 45/80     Time  8    Period  Weeks    Status  Partially Met    Target Date  02/11/18      PT LONG TERM GOAL #5   Title  Patient will be able to load and unload rollator indepedently to improve independence and quality of life.     Baseline  10/8: needs assistance to put rollator in and out of car 11/11: requires assistance    Time  8    Period  Weeks    Status  On-going      Additional Long Term Goals   Additional Long Term Goals  Yes      PT LONG TERM GOAL #6   Title  Patient will demonstrate an improved Berg Balance Score of >49/56 as to demonstrate improved balance with ADLs such as sitting/standing and transfer balance and reduced fall risk.     Baseline  12/2: 43/56     Time  8    Period  Weeks    Status  New    Target Date  02/11/18      PT LONG TERM GOAL #7   Title  Patient will demonstrate ability to ambulate without AD 200 ft Independently to increase independence in mobility.     Baseline  12/2: requires rollator     Time  8    Period  Weeks    Status  New    Target Date  02/11/18            Plan - 01/01/18 1332    Clinical Impression Statement  Patient continues to be challenged with prolonged ambulatory capacity, fatiguing and requiring seated rest breaks however is improving with repetition with longer duration prior to rest break. Patient challenged with maintaining COM with initiation and termination of mobility.  Patient's LE strength is improving with increased ability to perform prolonged muscle recruitment for functional capacity mobility such as ambulation.     Rehab Potential  Good    Clinical Impairments Affecting Rehab Potential  (+) improvement made within last year, eagerness to participate (-) HEP compliance, safety awareness     PT Frequency  2x / week    PT Duration  8 weeks    PT Treatment/Interventions  ADLs/Self Care Home Management;Cryotherapy;Electrical Stimulation;Iontophoresis 64m/ml Dexamethasone;Moist Heat;Ultrasound;DME Instruction;Gait training;Stair training;Balance training;Therapeutic exercise;Therapeutic activities;Functional mobility training;Neuromuscular re-education;Patient/family education;Manual techniques;Passive range of motion;Energy conservation    PT Next Visit Plan  ambulate without rollator    PT Home Exercise Plan  sit to stands, standing hip abduction, standing marches, tandem stance    Consulted and Agree with Plan of Care  Patient       Patient will benefit from skilled therapeutic intervention in order to improve the following deficits and impairments:  Abnormal gait, Decreased activity tolerance, Decreased balance, Decreased coordination, Decreased endurance, Decreased mobility, Decreased range of motion, Decreased safety awareness, Decreased strength, Difficulty walking, Impaired flexibility, Impaired perceived functional ability, Improper body mechanics, Postural dysfunction, Pain, Impaired UE functional use, Decreased knowledge of precautions  Visit Diagnosis: Muscle weakness (generalized)  Neurologic gait disorder  Unsteadiness on feet     Problem List Patient Active Problem List   Diagnosis Date Noted  .  Cognitive deficits   . Labile blood pressure   . Acute blood loss anemia   . Slow transit constipation   . Vascular headache   . 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 Hydrocephalus ([place]) 07/03/2017  . 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 Pelvic fracture ([place]) 04/18/2016  . CD (celiac disease) 08/04/2014  . Cancer of upper lobe of right lung ([place]) 08/04/2014  . Age related  osteoporosis 11/26/2013  . Absolute anemia 05/21/2013  . H/O malignant neoplasm 05/21/2013  . HLD (hyperlipidemia) 05/21/2013   Janna Arch, PT, DPT   01/01/2018, 1:44 PM  [place] MAIN [facility] SERVICES [address] [place], [place], [address] Phone: [phone]   Fax:  [phone]  Name: KEISHAWNA CARRANZA MRN: [id number] Date of Birth: 25-Jun-1945

## 2018-01-03 ENCOUNTER — Ambulatory Visit: Payer: PPO

## 2018-01-03 DIAGNOSIS — R2681 Unsteadiness on feet: Secondary | ICD-10-CM

## 2018-01-03 DIAGNOSIS — M6281 Muscle weakness (generalized): Secondary | ICD-10-CM

## 2018-01-03 DIAGNOSIS — R269 Unspecified abnormalities of gait and mobility: Secondary | ICD-10-CM

## 2018-01-03 NOTE — Therapy (Signed)
[place] MAIN [facility] SERVICES [address] [place], [place], [address] Phone: [phone]   Fax:  [phone]  Physical Therapy Treatment  Patient Details  Name: Krista Evans MRN: [id number] Date of Birth: Apr 08, 1945 Referring Provider (PT): Ramonita Lab   Encounter Date: 01/03/2018  PT End of Session - 01/03/18 1305    Visit Number  21    Number of Visits  32    Date for PT Re-Evaluation  02/11/18    Authorization Type  5/10 starting 12/2    PT Start Time  1300    PT Stop Time  1345    PT Time Calculation (min)  45 min    Equipment Utilized During Treatment  Gait belt    Activity Tolerance  Patient tolerated treatment well    Behavior During Therapy  [facility] for tasks assessed/performed       Past Medical History:  Diagnosis Date  . Abnormal Q waves on electrocardiogram   . Anemia   . Aortic atherosclerosis ([place])    "I could possibly have it, my grandmother had it"  . Cancer [facility]) 05/2011   Right upper Lung CA with partial Lobectomy.  . Celiac disease   . Celiac disease   . Dyspnea    with exertion  . Hyperlipidemia   . Hyperlipidemia   . Lung mass   . Meningioma ([place])   . Osteoporosis     Past Surgical History:  Procedure Laterality Date  . LUNG LOBECTOMY     right lung  . 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  Subjective Assessment - 01/03/18 1304    Subjective  Patient reports having a good day, No stumbles or falls reported since last session. Reports compliance with HEP.     Pertinent History  Patient stated she received therapy here about a year ago and went home and did not do a lot of exercise. Patient reports around June 2019 she had an MRI which showed enlarged ventricles and hydrocephalus and shunt was placed. Spent  about a week and half in the hospital where she also received therapy. Also received home health PT and OT for about 3 weeks when discharged from hospital but has not done her exercises since. States she is not aware of any shunt precautions but feels the shunt which is described as a sharp pain when she lays late and leans forward. Reports her doctor is currently unaware of these symptoms. Patient states she walks around the house daily and 1x week she walks down the street and back with husband. Patient reports L leg aching after she begins walking to bedroom and back with rollator. Reports this has been happening for a couple years. Patient would like to get stronger, improve balance, and become more independent. Currently requires assistance getting rollator in and out of car.      Limitations  Lifting;Standing;Walking;House hold activities    How long can you sit comfortably?  n/a    How long can you stand comfortably?  5 minutes    How long can you walk comfortably?  limited but patient unsure of duration     Patient Stated Goals  improve balance, strength, be more independent     Currently in Pain?  No/denies       Nustep lvl 4 4 minutes >  60rpm for cardiovascular challenge. Verbal cues to maintain speed   Ambulate without AD in hallway with verbal cues for improving forward momentum 1100 ft with one seated rest break and CGA. Verbal cues and tactile cues for L arm swing. Utilization of LUE to pushing button for doors to open, frequent LOB when attempting to open door. ; negotiate gift shop with narrow aisles with no episodes of LOB,      In // bars: Stop start: stoplight game to decrease loss of balance when initiating and terminating motion, x 3 minutes.    Airex pad: 4lb bar chest press. 10x, straight arm 10x    Airex pad: balloon taps inside and outside BOS 2 minutes.      Airex pad: reaching and clipping clothespins to basketball hoop line to promote reaching outside BOS while  maintaining stability on unstable surface; 8 minutes  Leg press 2x10 115lbs. Verbal cues to complete slowly and not let knees snap back. Cues to keep knees from coming together. Focus on equal force through each LE. 2nd set complete without tactile cueing with patient able to maintain appropriate technique.    Leg press: single leg: 60 lb 2x12, verbal cues for abducting knees to promote neutral body alignment. LLE more challenged than RLE. ; cues for decreasing velocity for improved muscle contraction.                        PT Education - 01/03/18 1251    Education provided  Yes    Education Details  exercise technique, stability     Person(s) Educated  Patient    Methods  Explanation;Demonstration;Tactile cues;Verbal cues    Comprehension  Verbalized understanding;Returned demonstration;Tactile cues required;Need further instruction;Verbal cues required       PT Short Term Goals - 12/17/17 1049      PT SHORT TERM GOAL #1   Title  Patient will be independent in HEP demonstrating successful between session carryover and improve functional mobility and ease of completing ADLs.    Baseline  10/8: HEP given next session 11/11: HEP compliance    Time  2    Period  Weeks    Status  Achieved      PT SHORT TERM GOAL #2   Title  Patient will be able to transfer sit<>Stand from regular height chair without pushing on arm rests to exhibit improved independence improved functional strength.     Baseline  10/8: BUE support 11/11: hands on knees    Time  2    Period  Weeks    Status  Achieved        PT Long Term Goals - 12/17/17 1050      PT LONG TERM GOAL #1   Title  Patient will ambulate >1059f with rollator during 647m demonstrating improved community ambulation and ease with shopping    Baseline  10/8: 845103f1/11: 890f53f/2: 1030 ft with rollator     Time  8    Period  Weeks    Status  Achieved      PT LONG TERM GOAL #2   Title  Patient (> 58 y48rs old) will  complete five times sit to stand test in < 15 seconds indicating an increased LE strength and improved balance.    Baseline  10/8: 26seconds with BUE 11/11: 24 seconds BUE 12/2: 16 seconds with SUE support     Time  8    Period  Weeks    Status  Partially Met    Target Date  02/11/18      PT LONG TERM GOAL #3   Title  Patient will demonstrate an improved Berg Balance Score of >42/56 as to demonstrate improved balance with ADLs such as sitting/standing and transfer balance and reduced fall risk.     Baseline  10/8: 37/56 11/11: 40/56 12/2: 43/56     Time  8    Period  Weeks    Status  Achieved      PT LONG TERM GOAL #4   Title  Patient will improve LEFS to 54/80 to demonstrate improved fucnctional strenth and mobility and ease of indepedence    Baseline  10/8: 45/80 11/11: 41/80 12/2: 45/80     Time  8    Period  Weeks    Status  Partially Met    Target Date  02/11/18      PT LONG TERM GOAL #5   Title  Patient will be able to load and unload rollator indepedently to improve independence and quality of life.     Baseline  10/8: needs assistance to put rollator in and out of car 11/11: requires assistance    Time  8    Period  Weeks    Status  On-going      Additional Long Term Goals   Additional Long Term Goals  Yes      PT LONG TERM GOAL #6   Title  Patient will demonstrate an improved Berg Balance Score of >49/56 as to demonstrate improved balance with ADLs such as sitting/standing and transfer balance and reduced fall risk.     Baseline  12/2: 43/56     Time  8    Period  Weeks    Status  New    Target Date  02/11/18      PT LONG TERM GOAL #7   Title  Patient will demonstrate ability to ambulate without AD 200 ft Independently to increase independence in mobility.     Baseline  12/2: requires rollator     Time  8    Period  Weeks    Status  New    Target Date  02/11/18            Plan - 01/03/18 1337    Clinical Impression Statement  Patient negotiated busy  aisles in hospital gift shop with CGA and no LOB, Termination and initiation of gait often results in trunk sway or LOB that decreased with repetition. Patient's gait mechanics are improving as noted with improved capacity for functional mobility. Patient will continue to benefit from skilled physical therapy to improve strength, balance, and functional mobility    Rehab Potential  Good    Clinical Impairments Affecting Rehab Potential  (+) improvement made within last year, eagerness to participate (-) HEP compliance, safety awareness     PT Frequency  2x / week    PT Duration  8 weeks    PT Treatment/Interventions  ADLs/Self Care Home Management;Cryotherapy;Electrical Stimulation;Iontophoresis 66m/ml Dexamethasone;Moist Heat;Ultrasound;DME Instruction;Gait training;Stair training;Balance training;Therapeutic exercise;Therapeutic activities;Functional mobility training;Neuromuscular re-education;Patient/family education;Manual techniques;Passive range of motion;Energy conservation    PT Next Visit Plan  ambulate without rollator    PT Home Exercise Plan  sit to stands, standing hip abduction, standing marches, tandem stance    Consulted and Agree with Plan of Care  Patient       Patient will benefit from skilled therapeutic intervention in order to improve the following deficits and impairments:  Abnormal gait,  Decreased activity tolerance, Decreased balance, Decreased coordination, Decreased endurance, Decreased mobility, Decreased range of motion, Decreased safety awareness, Decreased strength, Difficulty walking, Impaired flexibility, Impaired perceived functional ability, Improper body mechanics, Postural dysfunction, Pain, Impaired UE functional use, Decreased knowledge of precautions  Visit Diagnosis: Muscle weakness (generalized)  Neurologic gait disorder  Unsteadiness on feet     Problem List Patient Active Problem List   Diagnosis Date Noted  . Cognitive deficits   . Labile  blood pressure   . Acute blood loss anemia   . Slow transit constipation   . Vascular headache   . 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 Hydrocephalus ([place]) 07/03/2017  . 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 Pelvic fracture ([place]) 04/18/2016  . CD (celiac disease) 08/04/2014  . Cancer of upper lobe of right lung ([place]) 08/04/2014  . Age related osteoporosis 11/26/2013  . Absolute anemia 05/21/2013  . H/O malignant neoplasm 05/21/2013  . HLD (hyperlipidemia) 05/21/2013   Janna Arch, PT, DPT   01/03/2018, 1:45 PM  [place] MAIN [facility] SERVICES [address] [place], [place], [address] Phone: [phone]   Fax:  [phone]  Name: Krista Evans MRN: [id number] Date of Birth: 11-Dec-1945

## 2018-01-07 ENCOUNTER — Ambulatory Visit: Payer: PPO

## 2018-01-07 DIAGNOSIS — R2681 Unsteadiness on feet: Secondary | ICD-10-CM

## 2018-01-07 DIAGNOSIS — M6281 Muscle weakness (generalized): Secondary | ICD-10-CM

## 2018-01-07 DIAGNOSIS — R269 Unspecified abnormalities of gait and mobility: Secondary | ICD-10-CM

## 2018-01-07 NOTE — Therapy (Signed)
[place] MAIN [facility] SERVICES [address] [place], [place], [address] Phone: [phone]   Fax:  [phone]  Physical Therapy Treatment  Patient Details  Name: Krista Evans MRN: [id number] Date of Birth: [birth year] Referring Provider (PT): Ramonita Lab   Encounter Date: 01/07/2018  PT End of Session - 01/07/18 1304    Visit Number  22    Number of Visits  32    Date for PT Re-Evaluation  02/11/18    Authorization Type  6/10 starting 12/2    PT Start Time  1300    PT Stop Time  1345    PT Time Calculation (min)  45 min    Equipment Utilized During Treatment  Gait belt    Activity Tolerance  Patient tolerated treatment well    Behavior During Therapy  [facility] for tasks assessed/performed       Past Medical History:  Diagnosis Date  . Abnormal Q waves on electrocardiogram   . Anemia   . Aortic atherosclerosis ([place])    "I could possibly have it, my grandmother had it"  . Cancer [facility]) 05/2011   Right upper Lung CA with partial Lobectomy.  . Celiac disease   . Celiac disease   . Dyspnea    with exertion  . Hyperlipidemia   . Hyperlipidemia   . Lung mass   . Meningioma ([place])   . Osteoporosis     Past Surgical History:  Procedure Laterality Date  . LUNG LOBECTOMY     right lung  . 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  Subjective Assessment - 01/07/18 1303    Subjective  Patient presents to therapy after a good weekend. She was able to go shopping with her daughter in law holding onto her arm and the buggy.     Pertinent History  Patient stated she received therapy here about a year ago and went home and did not do a lot of exercise. Patient reports around June 2019 she had an MRI which showed enlarged ventricles and hydrocephalus and  shunt was placed. Spent about a week and half in the hospital where she also received therapy. Also received home health PT and OT for about 3 weeks when discharged from hospital but has not done her exercises since. States she is not aware of any shunt precautions but feels the shunt which is described as a sharp pain when she lays late and leans forward. Reports her doctor is currently unaware of these symptoms. Patient states she walks around the house daily and 1x week she walks down the street and back with husband. Patient reports L leg aching after she begins walking to bedroom and back with rollator. Reports this has been happening for a couple years. Patient would like to get stronger, improve balance, and become more independent. Currently requires assistance getting rollator in and out of car.      Limitations  Lifting;Standing;Walking;House hold activities    How long can you sit comfortably?  n/a    How long can you stand comfortably?  5 minutes    How long can you walk comfortably?  limited but patient unsure of duration     Patient Stated Goals  improve balance, strength, be more independent     Currently in Pain?  No/denies  Nustep lvl 4 4 minutes >60rpm for cardiovascular challenge. Verbal cues to maintain speed   Ambulate without AD in hallway with verbal cues for improving forward momentum 1100 ft with one seated rest break and CGA. Verbal cues and tactile cues for L arm swing. Utilization of LUE to pushing button for doors to open, frequent LOB when attempting to open door. Proper stabilization of self in elevator, ambulating up and down incline/ramp x4 trials    In // bars: Stop start: stoplight game to decrease loss of balance when initiating and terminating motion, x 3 minutes.    Airex pad: 4lb bar chest press. 10x, straight arm 10x    Airex pad: balloon taps inside and outside BOS 2 minutes.     SUE support cone taps 4 cones with PT guiding color and leg choice. 2  minutes  Leg press 2x10 115lbs. Verbal cues to complete slowly and not let knees snap back. Cues to keep knees from coming together. Focus on equal force through each LE. 2nd set complete without tactile cueing with patient able to maintain appropriate technique.     Leg press: single leg: 65 lb 2x12, verbal cues for abducting knees to promote neutral body alignment. LLE more challenged than RLE. ; cues for decreasing velocity for improved muscle contraction.                          PT Education - 01/07/18 1304    Education provided  Yes    Education Details  exercise technique, stability     Person(s) Educated  Patient    Methods  Explanation;Demonstration;Tactile cues;Verbal cues;Other (comment)    Comprehension  Verbalized understanding;Returned demonstration;Tactile cues required;Need further instruction;Verbal cues required       PT Short Term Goals - 12/17/17 1049      PT SHORT TERM GOAL #1   Title  Patient will be independent in HEP demonstrating successful between session carryover and improve functional mobility and ease of completing ADLs.    Baseline  10/8: HEP given next session 11/11: HEP compliance    Time  2    Period  Weeks    Status  Achieved      PT SHORT TERM GOAL #2   Title  Patient will be able to transfer sit<>Stand from regular height chair without pushing on arm rests to exhibit improved independence improved functional strength.     Baseline  10/8: BUE support 11/11: hands on knees    Time  2    Period  Weeks    Status  Achieved        PT Long Term Goals - 12/17/17 1050      PT LONG TERM GOAL #1   Title  Patient will ambulate >107f with rollator during 658m demonstrating improved community ambulation and ease with shopping    Baseline  10/8: 84546f1/11: 890f87f/2: 1030 ft with rollator     Time  8    Period  Weeks    Status  Achieved      PT LONG TERM GOAL #2   Title  Patient (> 60 y37rs old) will complete five times sit  to stand test in < 15 seconds indicating an increased LE strength and improved balance.    Baseline  10/8: 26seconds with BUE 11/11: 24 seconds BUE 12/2: 16 seconds with SUE support     Time  8    Period  Weeks    Status  Partially Met    Target Date  02/11/18      PT LONG TERM GOAL #3   Title  Patient will demonstrate an improved Berg Balance Score of >42/56 as to demonstrate improved balance with ADLs such as sitting/standing and transfer balance and reduced fall risk.     Baseline  10/8: 37/56 11/11: 40/56 12/2: 43/56     Time  8    Period  Weeks    Status  Achieved      PT LONG TERM GOAL #4   Title  Patient will improve LEFS to 54/80 to demonstrate improved fucnctional strenth and mobility and ease of indepedence    Baseline  10/8: 45/80 11/11: 41/80 12/2: 45/80     Time  8    Period  Weeks    Status  Partially Met    Target Date  02/11/18      PT LONG TERM GOAL #5   Title  Patient will be able to load and unload rollator indepedently to improve independence and quality of life.     Baseline  10/8: needs assistance to put rollator in and out of car 11/11: requires assistance    Time  8    Period  Weeks    Status  On-going      Additional Long Term Goals   Additional Long Term Goals  Yes      PT LONG TERM GOAL #6   Title  Patient will demonstrate an improved Berg Balance Score of >49/56 as to demonstrate improved balance with ADLs such as sitting/standing and transfer balance and reduced fall risk.     Baseline  12/2: 43/56     Time  8    Period  Weeks    Status  New    Target Date  02/11/18      PT LONG TERM GOAL #7   Title  Patient will demonstrate ability to ambulate without AD 200 ft Independently to increase independence in mobility.     Baseline  12/2: requires rollator     Time  8    Period  Weeks    Status  New    Target Date  02/11/18            Plan - 01/07/18 1325    Clinical Impression Statement  Patient initially challenged by ambulating up and  down ramp without AD however demonstrated improved gait mechanics with repetition with increased stability and self confidence. Patient's LE strength is improving with increased ability to perform prolonged muscle recruitment for functional capacity mobility such as ambulation. Patient will continue to benefit from skilled physical therapy to improve strength, balance, and functional mobility    Rehab Potential  Good    Clinical Impairments Affecting Rehab Potential  (+) improvement made within last year, eagerness to participate (-) HEP compliance, safety awareness     PT Frequency  2x / week    PT Duration  8 weeks    PT Treatment/Interventions  ADLs/Self Care Home Management;Cryotherapy;Electrical Stimulation;Iontophoresis 63m/ml Dexamethasone;Moist Heat;Ultrasound;DME Instruction;Gait training;Stair training;Balance training;Therapeutic exercise;Therapeutic activities;Functional mobility training;Neuromuscular re-education;Patient/family education;Manual techniques;Passive range of motion;Energy conservation    PT Next Visit Plan  ambulate without rollator    PT Home Exercise Plan  sit to stands, standing hip abduction, standing marches, tandem stance    Consulted and Agree with Plan of Care  Patient       Patient will benefit from skilled therapeutic intervention in order to improve the following deficits and impairments:  Abnormal gait, Decreased activity tolerance, Decreased balance, Decreased coordination, Decreased endurance, Decreased mobility, Decreased range of motion, Decreased safety awareness, Decreased strength, Difficulty walking, Impaired flexibility, Impaired perceived functional ability, Improper body mechanics, Postural dysfunction, Pain, Impaired UE functional use, Decreased knowledge of precautions  Visit Diagnosis: Muscle weakness (generalized)  Neurologic gait disorder  Unsteadiness on feet     Problem List Patient Active Problem List   Diagnosis Date Noted  .  Cognitive deficits   . Labile blood pressure   . Acute blood loss anemia   . Slow transit constipation   . Vascular headache   . 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 Hydrocephalus ([place]) 07/03/2017  . 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 Pelvic fracture ([place]) 04/18/2016  . CD (celiac disease) 08/04/2014  . Cancer of upper lobe of right lung ([place]) 08/04/2014  . Age related osteoporosis 11/26/2013  . Absolute anemia 05/21/2013  . H/O malignant neoplasm 05/21/2013  . HLD (hyperlipidemia) 05/21/2013   Janna Arch, PT, DPT   01/07/2018, 1:48 PM  [place] MAIN [facility] SERVICES [address] [place], [place], [address] Phone: [phone]   Fax:  [phone]  Name: Krista Evans MRN: [id number] Date of Birth: 1945-11-30

## 2018-01-11 ENCOUNTER — Ambulatory Visit: Payer: PPO

## 2018-01-15 ENCOUNTER — Ambulatory Visit: Payer: PPO

## 2018-01-15 DIAGNOSIS — R269 Unspecified abnormalities of gait and mobility: Secondary | ICD-10-CM

## 2018-01-15 DIAGNOSIS — M6281 Muscle weakness (generalized): Secondary | ICD-10-CM | POA: Diagnosis not present

## 2018-01-15 DIAGNOSIS — R2681 Unsteadiness on feet: Secondary | ICD-10-CM

## 2018-01-15 NOTE — Therapy (Signed)
[place] MAIN [facility] SERVICES [address] [place], [place], [address] Phone: [phone]   Fax:  [phone]  Physical Therapy Treatment  Patient Details  Name: Krista Evans MRN: [id number] Date of Birth: 1945/05/08 Referring Provider (PT): Ramonita Lab   Encounter Date: 01/15/2018  PT End of Session - 01/15/18 1308    Visit Number  23    Number of Visits  32    Date for PT Re-Evaluation  02/11/18    Authorization Type  7/10 starting 12/2    PT Start Time  1303    PT Stop Time  1347    PT Time Calculation (min)  44 min    Equipment Utilized During Treatment  Gait belt    Activity Tolerance  Patient tolerated treatment well    Behavior During Therapy  [facility] for tasks assessed/performed       Past Medical History:  Diagnosis Date  . Abnormal Q waves on electrocardiogram   . Anemia   . Aortic atherosclerosis ([place])    "I could possibly have it, my grandmother had it"  . Cancer [facility]) 05/2011   Right upper Lung CA with partial Lobectomy.  . Celiac disease   . Celiac disease   . Dyspnea    with exertion  . Hyperlipidemia   . Hyperlipidemia   . Lung mass   . Meningioma ([place])   . Osteoporosis     Past Surgical History:  Procedure Laterality Date  . LUNG LOBECTOMY     right lung  . 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  Subjective Assessment - 01/15/18 1306    Subjective  pt with some questions about HEP, overall doing well, no recent falls.    Pertinent History  Patient stated she received therapy here about a year ago and went home and did not do a lot of exercise. Patient reports around June 2019 she had an MRI which showed enlarged ventricles and hydrocephalus and shunt was placed. Spent about a week and half in the hospital where  she also received therapy. Also received home health PT and OT for about 3 weeks when discharged from hospital but has not done her exercises since. States she is not aware of any shunt precautions but feels the shunt which is described as a sharp pain when she lays late and leans forward. Reports her doctor is currently unaware of these symptoms. Patient states she walks around the house daily and 1x week she walks down the street and back with husband. Patient reports L leg aching after she begins walking to bedroom and back with rollator. Reports this has been happening for a couple years. Patient would like to get stronger, improve balance, and become more independent. Currently requires assistance getting rollator in and out of car.      Limitations  Lifting;Standing;Walking;House hold activities    How long can you sit comfortably?  n/a    How long can you stand comfortably?  5 minutes    How long can you walk comfortably?  limited but patient unsure of duration     Patient Stated Goals  improve balance, strength, be more independent     Currently in Pain?  No/denies      TREATMENT: Nustep lvl 4 4 minutes >60rpm for cardiovascular challenge. Verbal cues to  maintain speed   Gait training:  Ambulate without AD in hallway with verbal cues for improving forward momentum 1100 ft with. Verbal cues and tactile cues for L arm swing. Utilization of LUE to pushing button for doors to open, frequent stuttering/shuffling when attempting to open door. Proper stabilization of self in elevator, ambulating up and down incline/ramp x4 trials, pt anxious and needed mod encouragement during ambulation on decline/incline. Cues for stride length.   Side stepping in hall way x1 lap for 15f ea way, pt reported feeling dizzy due to visual input (very close to blank wall, not improved when instructed to move away from wall) further attempts held.  Neuro-reeducation:  In // bars: Stop start: stoplight game to  decrease loss of balance when initiating and terminating motion, x 3 minutes.    Airex pad: 4lb bar chest press. 10x, straight arm 10x    Airex pad: modified tandem stance x2 ea side, 30sec holds with intermittent UE support     SUE support cone taps 4 cones with PT guiding color and leg choice. 2 rounds x7 attempts ea side. Mod encouragement to perform with SUE support, reaches for bilateral support with mobilizing LLE.   Therapeutic exercise:   Standing hip abduction with UE support x15 in parallel bars  Leg press 2x10 115lbs. Verbal cues to complete slowly and not let knees snap back. Cues to keep knees from coming together. Focus on equal force through each LE. 2nd set complete without tactile cueing with patient able to maintain appropriate technique.     Leg press: single leg: 65 lb x10, verbal cues for abducting knees to promote neutral body alignment. LLE more challenged than RLE, pt performed to fatigue, only able to perform x10. ;    PT Education - 01/15/18 1307    Education provided  Yes    Education Details  exercise technique, form    Person(s) Educated  Patient    Methods  Explanation;Demonstration;Tactile cues;Verbal cues    Comprehension  Verbalized understanding;Need further instruction;Returned demonstration;Verbal cues required       PT Short Term Goals - 12/17/17 1049      PT SHORT TERM GOAL #1   Title  Patient will be independent in HEP demonstrating successful between session carryover and improve functional mobility and ease of completing ADLs.    Baseline  10/8: HEP given next session 11/11: HEP compliance    Time  2    Period  Weeks    Status  Achieved      PT SHORT TERM GOAL #2   Title  Patient will be able to transfer sit<>Stand from regular height chair without pushing on arm rests to exhibit improved independence improved functional strength.     Baseline  10/8: BUE support 11/11: hands on knees    Time  2    Period  Weeks    Status  Achieved         PT Long Term Goals - 12/17/17 1050      PT LONG TERM GOAL #1   Title  Patient will ambulate >10050fwith rollator during 7m32mdemonstrating improved community ambulation and ease with shopping    Baseline  10/8: 845f15f/11: [id number]: 1030 ft with rollator     Time  8    Period  Weeks    Status  Achieved      PT LONG TERM GOAL #2   Title  Patient (> 60 ye61s old) will complete five times sit to stand  test in < 15 seconds indicating an increased LE strength and improved balance.    Baseline  10/8: 26seconds with BUE 11/11: 24 seconds BUE 12/2: 16 seconds with SUE support     Time  8    Period  Weeks    Status  Partially Met    Target Date  02/11/18      PT LONG TERM GOAL #3   Title  Patient will demonstrate an improved Berg Balance Score of >42/56 as to demonstrate improved balance with ADLs such as sitting/standing and transfer balance and reduced fall risk.     Baseline  10/8: 37/56 11/11: 40/56 12/2: 43/56     Time  8    Period  Weeks    Status  Achieved      PT LONG TERM GOAL #4   Title  Patient will improve LEFS to 54/80 to demonstrate improved fucnctional strenth and mobility and ease of indepedence    Baseline  10/8: 45/80 11/11: 41/80 12/2: 45/80     Time  8    Period  Weeks    Status  Partially Met    Target Date  02/11/18      PT LONG TERM GOAL #5   Title  Patient will be able to load and unload rollator indepedently to improve independence and quality of life.     Baseline  10/8: needs assistance to put rollator in and out of car 11/11: requires assistance    Time  8    Period  Weeks    Status  On-going      Additional Long Term Goals   Additional Long Term Goals  Yes      PT LONG TERM GOAL #6   Title  Patient will demonstrate an improved Berg Balance Score of >49/56 as to demonstrate improved balance with ADLs such as sitting/standing and transfer balance and reduced fall risk.     Baseline  12/2: 43/56     Time  8    Period  Weeks    Status  New     Target Date  02/11/18      PT LONG TERM GOAL #7   Title  Patient will demonstrate ability to ambulate without AD 200 ft Independently to increase independence in mobility.     Baseline  12/2: requires rollator     Time  8    Period  Weeks    Status  New    Target Date  02/11/18            Plan - 01/15/18 1341    Clinical Impression Statement  Pt fearful throughout session, needed mod encouragement during balance activities and tactile support reassurance. Most challenged by uneven surfaces. Pt also had difficulty with smooth motion during turns/transtional movements, exhibited shuffling/stuttering steps with some improvement with verbal cues. The patient would benefit from further skilled PT to continue to progress towards goals and to maximize mobility, independence, and safety.    Rehab Potential  Good    Clinical Impairments Affecting Rehab Potential  (+) improvement made within last year, eagerness to participate (-) HEP compliance, safety awareness     PT Frequency  2x / week    PT Duration  8 weeks    PT Treatment/Interventions  ADLs/Self Care Home Management;Cryotherapy;Electrical Stimulation;Iontophoresis 70m/ml Dexamethasone;Moist Heat;Ultrasound;DME Instruction;Gait training;Stair training;Balance training;Therapeutic exercise;Therapeutic activities;Functional mobility training;Neuromuscular re-education;Patient/family education;Manual techniques;Passive range of motion;Energy conservation    PT Next Visit Plan  ambulate without rollator    PT  Home Exercise Plan  sit to stands, standing hip abduction, standing marches, tandem stance    Consulted and Agree with Plan of Care  Patient       Patient will benefit from skilled therapeutic intervention in order to improve the following deficits and impairments:  Abnormal gait, Decreased activity tolerance, Decreased balance, Decreased coordination, Decreased endurance, Decreased mobility, Decreased range of motion, Decreased  safety awareness, Decreased strength, Difficulty walking, Impaired flexibility, Impaired perceived functional ability, Improper body mechanics, Postural dysfunction, Pain, Impaired UE functional use, Decreased knowledge of precautions  Visit Diagnosis: Muscle weakness (generalized)  Neurologic gait disorder  Unsteadiness on feet     Problem List Patient Active Problem List   Diagnosis Date Noted  . Cognitive deficits   . Labile blood pressure   . Acute blood loss anemia   . Slow transit constipation   . Vascular headache   . 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 Hydrocephalus ([place]) 07/03/2017  . 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 Pelvic fracture ([place]) 04/18/2016  . CD (celiac disease) 08/04/2014  . Cancer of upper lobe of right lung ([place]) 08/04/2014  . Age related osteoporosis 11/26/2013  . Absolute anemia 05/21/2013  . H/O malignant neoplasm 05/21/2013  . HLD (hyperlipidemia) 05/21/2013    Lieutenant Diego PT, DPT 1:55 PM,01/15/18 [place] MAIN [facility] SERVICES [address] [place], [place], [address] Phone: [phone]   Fax:  [phone]  Name: Krista Evans MRN: [id number] Date of Birth: 1945/02/26

## 2018-01-22 ENCOUNTER — Ambulatory Visit: Payer: PPO | Attending: Internal Medicine

## 2018-01-22 VITALS — BP 162/75 | HR 75 | Temp 98.2°F

## 2018-01-22 DIAGNOSIS — R269 Unspecified abnormalities of gait and mobility: Secondary | ICD-10-CM | POA: Insufficient documentation

## 2018-01-22 DIAGNOSIS — R2681 Unsteadiness on feet: Secondary | ICD-10-CM | POA: Diagnosis not present

## 2018-01-22 DIAGNOSIS — M6281 Muscle weakness (generalized): Secondary | ICD-10-CM | POA: Diagnosis not present

## 2018-01-22 NOTE — Therapy (Signed)
[place] MAIN [facility] SERVICES [address] [place], [place], [address] Phone: [phone]   Fax:  [phone]  Physical Therapy Treatment  Patient Details  Name: Krista Evans MRN: [id number] Date of Birth: 02-13-1945 Referring Provider (PT): Ramonita Lab   Encounter Date: 01/22/2018  PT End of Session - 01/22/18 0937    Visit Number  24    Number of Visits  32    Date for PT Re-Evaluation  02/11/18    Authorization Type  8/10 starting 12/2    PT Start Time  0932    PT Stop Time  1015    PT Time Calculation (min)  43 min    Equipment Utilized During Treatment  Gait belt    Activity Tolerance  Patient tolerated treatment well    Behavior During Therapy  [facility] for tasks assessed/performed       Past Medical History:  Diagnosis Date  . Abnormal Q waves on electrocardiogram   . Anemia   . Aortic atherosclerosis ([place])    "I could possibly have it, my grandmother had it"  . Cancer [facility]) 05/2011   Right upper Lung CA with partial Lobectomy.  . Celiac disease   . Celiac disease   . Dyspnea    with exertion  . Hyperlipidemia   . Hyperlipidemia   . Lung mass   . Meningioma ([place])   . Osteoporosis     Past Surgical History:  Procedure Laterality Date  . LUNG LOBECTOMY     right lung  . 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 She does reports some mild SOB since her recent illness but it is improving.     Pertinent History  Patient stated she received therapy here about a year ago and went home and did not do a lot of exercise. Patient reports  around June 2019 she had an MRI which showed enlarged ventricles and hydrocephalus and shunt was placed. Spent about a week and half in the hospital where she also received therapy. Also received home health PT and OT for about 3 weeks when discharged from hospital but has not done her exercises since. States she is not aware of any shunt precautions but feels the shunt which is described as a sharp pain when she lays late and leans forward. Reports her doctor is currently unaware of these symptoms. Patient states she walks around the house daily and 1x week she walks down the street and back with husband. Patient reports L leg aching after she begins walking to bedroom and back with rollator. Reports this has been happening for a couple years. Patient would like to get stronger, improve balance, and become more independent. Currently requires assistance getting rollator in and out of car.      Limitations  Lifting;Standing;Walking;House hold activities    How long can you sit comfortably?  n/a    How long can you stand comfortably?  5 minutes    How long can you walk comfortably?  limited but patient unsure of duration     Patient Stated  Goals  improve balance, strength, be more independent     Currently in Pain?  No/denies           TREATMENT   Gait training Ambulation without AD in hallway withverbal cues for improving forward momentum400 ft.  Utilization of LUE to pushing button for doors to open. Pt encouraged to perform limited horizontal and vertical head turns however she is limited due to fear, anxiety, and imbalance. One seated rest break provided due to fatigue and DOE. SaO2 94% after returning to rehab gym;   Neuro-reeducation  In // bars: Red light/green light stoplight game to decrease loss of balance when initiating and terminating motion, x 3 minutes. WBOS ball passes around body on both sides x multiple bouts; Side stepping and retro ambulation x 4 lengths each; 6"  hurdle step alternating LE x 10 each without UE support but close CGA/minA+1 from therapist;   Therapeutic exercise: Nustep L2-4x 24mnutes >60rpm for cardiovascular challenge. Verbal cues to maintain speed and resistance decreased from level 4 to level 2 secondary to fatigue. History obtained (2 minutes unbilled); Seated marches with manual resistance 15; Seated clams with manual resistance x 15; Seated adductor squeezes with manual resistance x 15; Seated heel raises with manual resistance x 15; Standing hip abduction with BUE support x15 in parallel bars; Standing mini squats x 15 with BUE support; Standing HS curls x 15 bilateral with BUE support;   Pt educated throughout session about proper posture and technique with exercises. Improved exercise technique, movement at target joints, use of target muscles after min to mod verbal, visual, tactile cues.    Pt fearful throughout session, needed mod encouragement during balance activities and tactile support reassurance. Close CGA/minA+1 provided during balance activities. Pt struggles performing any spontaneous or cued head turns during ambulation and has to stop mostly in order to perform safely/confidently. The patient would benefit from further skilled PT to continue to progress towards goals and to maximize mobility, independence, and safety.               PT Short Term Goals - 12/17/17 1049      PT SHORT TERM GOAL #1   Title  Patient will be independent in HEP demonstrating successful between session carryover and improve functional mobility and ease of completing ADLs.    Baseline  10/8: HEP given next session 11/11: HEP compliance    Time  2    Period  Weeks    Status  Achieved      PT SHORT TERM GOAL #2   Title  Patient will be able to transfer sit<>Stand from regular height chair without pushing on arm rests to exhibit improved independence improved functional strength.     Baseline  10/8: BUE support 11/11:  hands on knees    Time  2    Period  Weeks    Status  Achieved        PT Long Term Goals - 12/17/17 1050      PT LONG TERM GOAL #1   Title  Patient will ambulate >10064fwith rollator during 46m78mdemonstrating improved community ambulation and ease with shopping    Baseline  10/8: 845f65f/11: [id number]: 1030 ft with rollator     Time  8    Period  Weeks    Status  Achieved      PT LONG TERM GOAL #2   Title  Patient (> 60 ye46s old) will complete five times sit to stand test in <  15 seconds indicating an increased LE strength and improved balance.    Baseline  10/8: 26seconds with BUE 11/11: 24 seconds BUE 12/2: 16 seconds with SUE support     Time  8    Period  Weeks    Status  Partially Met    Target Date  02/11/18      PT LONG TERM GOAL #3   Title  Patient will demonstrate an improved Berg Balance Score of >42/56 as to demonstrate improved balance with ADLs such as sitting/standing and transfer balance and reduced fall risk.     Baseline  10/8: 37/56 11/11: 40/56 12/2: 43/56     Time  8    Period  Weeks    Status  Achieved      PT LONG TERM GOAL #4   Title  Patient will improve LEFS to 54/80 to demonstrate improved fucnctional strenth and mobility and ease of indepedence    Baseline  10/8: 45/80 11/11: 41/80 12/2: 45/80     Time  8    Period  Weeks    Status  Partially Met    Target Date  02/11/18      PT LONG TERM GOAL #5   Title  Patient will be able to load and unload rollator indepedently to improve independence and quality of life.     Baseline  10/8: needs assistance to put rollator in and out of car 11/11: requires assistance    Time  8    Period  Weeks    Status  On-going      Additional Long Term Goals   Additional Long Term Goals  Yes      PT LONG TERM GOAL #6   Title  Patient will demonstrate an improved Berg Balance Score of >49/56 as to demonstrate improved balance with ADLs such as sitting/standing and transfer balance and reduced fall risk.      Baseline  12/2: 43/56     Time  8    Period  Weeks    Status  New    Target Date  02/11/18      PT LONG TERM GOAL #7   Title  Patient will demonstrate ability to ambulate without AD 200 ft Independently to increase independence in mobility.     Baseline  12/2: requires rollator     Time  8    Period  Weeks    Status  New    Target Date  02/11/18            Plan - 01/22/18 1400    Clinical Impression Statement  Pt fearful throughout session, needed mod encouragement during balance activities and tactile support reassurance. Close CGA/minA+1 provided during balance activities. Pt struggles performing any spontaneous or cued head turns during ambulation and has to stop mostly in order to perform safely/confidently. The patient would benefit from further skilled PT to continue to progress towards goals and to maximize mobility, independence, and safety.    Rehab Potential  Good    Clinical Impairments Affecting Rehab Potential  (+) improvement made within last year, eagerness to participate (-) HEP compliance, safety awareness     PT Frequency  2x / week    PT Duration  8 weeks    PT Treatment/Interventions  ADLs/Self Care Home Management;Cryotherapy;Electrical Stimulation;Iontophoresis 68m/ml Dexamethasone;Moist Heat;Ultrasound;DME Instruction;Gait training;Stair training;Balance training;Therapeutic exercise;Therapeutic activities;Functional mobility training;Neuromuscular re-education;Patient/family education;Manual techniques;Passive range of motion;Energy conservation    PT Next Visit Plan  ambulate without rollator    PT  Home Exercise Plan  sit to stands, standing hip abduction, standing marches, tandem stance    Consulted and Agree with Plan of Care  Patient       Patient will benefit from skilled therapeutic intervention in order to improve the following deficits and impairments:  Abnormal gait, Decreased activity tolerance, Decreased balance, Decreased coordination, Decreased  endurance, Decreased mobility, Decreased range of motion, Decreased safety awareness, Decreased strength, Difficulty walking, Impaired flexibility, Impaired perceived functional ability, Improper body mechanics, Postural dysfunction, Pain, Impaired UE functional use, Decreased knowledge of precautions  Visit Diagnosis: Muscle weakness (generalized)  Unsteadiness on feet     Problem List Patient Active Problem List   Diagnosis Date Noted  . Cognitive deficits   . Labile blood pressure   . Acute blood loss anemia   . Slow transit constipation   . Vascular headache   . 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 Hydrocephalus ([place]) 07/03/2017  . 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 Pelvic fracture ([place]) 04/18/2016  . CD (celiac disease) 08/04/2014  . Cancer of upper lobe of right lung ([place]) 08/04/2014  . Age related osteoporosis 11/26/2013  . Absolute anemia 05/21/2013  . H/O malignant neoplasm 05/21/2013  . HLD (hyperlipidemia) 05/21/2013   Phillips Grout PT, DPT, GCS  Huprich,Jason 01/22/2018, 2:04 PM  [place] MAIN [facility] SERVICES [address] [place], [place], [address] Phone: [phone]   Fax:  [phone]  Name: Krista Evans MRN: [id number] Date of Birth: 09-13-1945

## 2018-01-23 ENCOUNTER — Ambulatory Visit
Admission: RE | Admit: 2018-01-23 | Discharge: 2018-01-23 | Disposition: A | Discharge status: Home / Self Care | Payer: PPO | Source: Ambulatory Visit | Attending: Internal Medicine | Admitting: Internal Medicine

## 2018-01-23 ENCOUNTER — Ambulatory Visit: Payer: PPO

## 2018-01-23 DIAGNOSIS — Z1231 Encounter for screening mammogram for malignant neoplasm of breast: Secondary | ICD-10-CM | POA: Insufficient documentation

## 2018-01-23 DIAGNOSIS — R2681 Unsteadiness on feet: Secondary | ICD-10-CM

## 2018-01-23 DIAGNOSIS — M6281 Muscle weakness (generalized): Secondary | ICD-10-CM

## 2018-01-23 DIAGNOSIS — R269 Unspecified abnormalities of gait and mobility: Secondary | ICD-10-CM

## 2018-01-23 HISTORY — DX: Personal history of irradiation: Z92.3

## 2018-01-23 HISTORY — DX: Personal history of antineoplastic chemotherapy: Z92.21

## 2018-01-23 NOTE — Therapy (Signed)
[place] MAIN [facility] SERVICES [address] [place], [place], [address] Phone: [phone]   Fax:  [phone]  Physical Therapy Treatment  Patient Details  Name: Krista Evans MRN: [id number] Date of Birth: [birth year] Referring Provider (PT): Ramonita Lab   Encounter Date: 01/23/2018  PT End of Session - 01/23/18 1107    Visit Number  25    Number of Visits  32    Date for PT Re-Evaluation  02/11/18    Authorization Type  9/10 starting 12/2    PT Start Time  1101    PT Stop Time  1146    PT Time Calculation (min)  45 min    Equipment Utilized During Treatment  Gait belt    Activity Tolerance  Patient tolerated treatment well    Behavior During Therapy  [facility] for tasks assessed/performed       Past Medical History:  Diagnosis Date  . Abnormal Q waves on electrocardiogram   . Anemia   . Aortic atherosclerosis ([place])    "I could possibly have it, my grandmother had it"  . Cancer [facility]) 05/2011   Right upper Lung CA with partial Lobectomy.  . Celiac disease   . Celiac disease   . Dyspnea    with exertion  . Hyperlipidemia   . Hyperlipidemia   . Lung mass   . Meningioma ([place])   . Osteoporosis     Past Surgical History:  Procedure Laterality Date  . LUNG LOBECTOMY     right lung  . 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  Subjective Assessment - 01/23/18 1105    Subjective  Patient reports she is doing better from her antibiotics but still is getting short of breath due to recent illness. Reports no falls or LOB.     Pertinent History  Patient stated she received therapy here about a year ago and went home and did not do a lot of exercise. Patient reports around June 2019 she had an MRI which showed enlarged ventricles and hydrocephalus  and shunt was placed. Spent about a week and half in the hospital where she also received therapy. Also received home health PT and OT for about 3 weeks when discharged from hospital but has not done her exercises since. States she is not aware of any shunt precautions but feels the shunt which is described as a sharp pain when she lays late and leans forward. Reports her doctor is currently unaware of these symptoms. Patient states she walks around the house daily and 1x week she walks down the street and back with husband. Patient reports L leg aching after she begins walking to bedroom and back with rollator. Reports this has been happening for a couple years. Patient would like to get stronger, improve balance, and become more independent. Currently requires assistance getting rollator in and out of car.      Limitations  Lifting;Standing;Walking;House hold activities    How long can you sit comfortably?  n/a    How long can you stand comfortably?  5 minutes    How long can you walk comfortably?  limited but patient unsure of duration     Patient Stated Goals  improve balance, strength, be more independent     Currently in Pain?  No/denies  TREATMENT: Nustep lvl 3 4 minutes >60rpm for cardiovascular challenge. Verbal cues to maintain speed   Gait training:  Ambulate without AD in hallway with verbal cues for improving forward momentum 1100 ft with. Verbal cues and tactile cues for L arm swing. Utilization of LUE to pushing button for doors to open, frequent stuttering/shuffling when attempting to open door.  pt anxious and needed mod encouragement during ambulation on decline/incline. Cues for stride length.       Neuro-reeducation:  In // bars:    Airex pad: modified tandem stance x2 ea side, 30sec holds with no UE support     SUE support cone taps 4 cones with PT guiding color and leg choice. 2 rounds x7 attempts ea side. Mod encouragement to perform with SUE support, reaches  for bilateral support with mobilizing LLE.    Therapeutic exercise:     Leg press 2x10 115lbs. Verbal cues to complete slowly and not let knees snap back. Cues to keep knees from coming together. Focus on equal force through each LE. 2nd set complete without tactile cueing with patient able to maintain appropriate technique.     Leg press: single leg: 65 lb x10, verbal cues for abducting knees to promote neutral body alignment. LLE more challenged than RLE, pt performed to fatigue, only able to perform x10. ;   Sp02 monitored requiring patient to perform rest breaks to remain in the 90's Sp02.   Patient educated on purse lipped breathing for respiratory capacity and relaxation.                    PT Education - 01/23/18 1106    Education provided  Yes    Education Details  exercise technique, stability     Person(s) Educated  Patient    Methods  Explanation;Demonstration;Tactile cues;Verbal cues    Comprehension  Verbalized understanding;Returned demonstration;Tactile cues required;Need further instruction       PT Short Term Goals - 12/17/17 1049      PT SHORT TERM GOAL #1   Title  Patient will be independent in HEP demonstrating successful between session carryover and improve functional mobility and ease of completing ADLs.    Baseline  10/8: HEP given next session 11/11: HEP compliance    Time  2    Period  Weeks    Status  Achieved      PT SHORT TERM GOAL #2   Title  Patient will be able to transfer sit<>Stand from regular height chair without pushing on arm rests to exhibit improved independence improved functional strength.     Baseline  10/8: BUE support 11/11: hands on knees    Time  2    Period  Weeks    Status  Achieved        PT Long Term Goals - 12/17/17 1050      PT LONG TERM GOAL #1   Title  Patient will ambulate >1074f with rollator during 686m demonstrating improved community ambulation and ease with shopping    Baseline  10/8: 84512f11/11: 890f17f/2: 1030 ft with rollator     Time  8    Period  Weeks    Status  Achieved      PT LONG TERM GOAL #2   Title  Patient (> 58 y36rs old) will complete five times sit to stand test in < 15 seconds indicating an increased LE strength and improved balance.    Baseline  10/8: 26seconds with BUE 11/11: 24  seconds BUE 12/2: 16 seconds with SUE support     Time  8    Period  Weeks    Status  Partially Met    Target Date  02/11/18      PT LONG TERM GOAL #3   Title  Patient will demonstrate an improved Berg Balance Score of >42/56 as to demonstrate improved balance with ADLs such as sitting/standing and transfer balance and reduced fall risk.     Baseline  10/8: 37/56 11/11: 40/56 12/2: 43/56     Time  8    Period  Weeks    Status  Achieved      PT LONG TERM GOAL #4   Title  Patient will improve LEFS to 54/80 to demonstrate improved fucnctional strenth and mobility and ease of indepedence    Baseline  10/8: 45/80 11/11: 41/80 12/2: 45/80     Time  8    Period  Weeks    Status  Partially Met    Target Date  02/11/18      PT LONG TERM GOAL #5   Title  Patient will be able to load and unload rollator indepedently to improve independence and quality of life.     Baseline  10/8: needs assistance to put rollator in and out of car 11/11: requires assistance    Time  8    Period  Weeks    Status  On-going      Additional Long Term Goals   Additional Long Term Goals  Yes      PT LONG TERM GOAL #6   Title  Patient will demonstrate an improved Berg Balance Score of >49/56 as to demonstrate improved balance with ADLs such as sitting/standing and transfer balance and reduced fall risk.     Baseline  12/2: 43/56     Time  8    Period  Weeks    Status  New    Target Date  02/11/18      PT LONG TERM GOAL #7   Title  Patient will demonstrate ability to ambulate without AD 200 ft Independently to increase independence in mobility.     Baseline  12/2: requires rollator     Time  8     Period  Weeks    Status  New    Target Date  02/11/18            Plan - 01/23/18 1132    Clinical Impression Statement  Patient presents with limited respiratory capacity for prolonged mobility resulting in frequent rest breaks to maintain stable Sp02 levels. Patient struggles to maintain stability with termination of mobility frequently having excessive forward momentum. The patient would benefit from further skilled PT to continue to progress towards goals and to maximize mobility, independence, and safety.    Rehab Potential  Good    Clinical Impairments Affecting Rehab Potential  (+) improvement made within last year, eagerness to participate (-) HEP compliance, safety awareness     PT Frequency  2x / week    PT Duration  8 weeks    PT Treatment/Interventions  ADLs/Self Care Home Management;Cryotherapy;Electrical Stimulation;Iontophoresis 98m/ml Dexamethasone;Moist Heat;Ultrasound;DME Instruction;Gait training;Stair training;Balance training;Therapeutic exercise;Therapeutic activities;Functional mobility training;Neuromuscular re-education;Patient/family education;Manual techniques;Passive range of motion;Energy conservation    PT Next Visit Plan  ambulate without rollator    PT Home Exercise Plan  sit to stands, standing hip abduction, standing marches, tandem stance    Consulted and Agree with Plan of Care  Patient  Patient will benefit from skilled therapeutic intervention in order to improve the following deficits and impairments:  Abnormal gait, Decreased activity tolerance, Decreased balance, Decreased coordination, Decreased endurance, Decreased mobility, Decreased range of motion, Decreased safety awareness, Decreased strength, Difficulty walking, Impaired flexibility, Impaired perceived functional ability, Improper body mechanics, Postural dysfunction, Pain, Impaired UE functional use, Decreased knowledge of precautions  Visit Diagnosis: Muscle weakness  (generalized)  Unsteadiness on feet  Neurologic gait disorder     Problem List Patient Active Problem List   Diagnosis Date Noted  . Cognitive deficits   . Labile blood pressure   . Acute blood loss anemia   . Slow transit constipation   . Vascular headache   . 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 Hydrocephalus ([place]) 07/03/2017  . 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 Pelvic fracture ([place]) 04/18/2016  . CD (celiac disease) 08/04/2014  . Cancer of upper lobe of right lung ([place]) 08/04/2014  . Age related osteoporosis 11/26/2013  . Absolute anemia 05/21/2013  . H/O malignant neoplasm 05/21/2013  . HLD (hyperlipidemia) 05/21/2013   Janna Arch, PT, DPT   01/23/2018, 11:54 AM  [place] MAIN [facility] SERVICES [address] [place], [place], [address] Phone: [phone]   Fax:  [phone]  Name: Krista Evans MRN: [id number] Date of Birth: 04/21/1945

## 2018-01-25 DIAGNOSIS — Z85118 Personal history of other malignant neoplasm of bronchus and lung: Secondary | ICD-10-CM | POA: Diagnosis not present

## 2018-01-25 DIAGNOSIS — R05 Cough: Secondary | ICD-10-CM | POA: Diagnosis not present

## 2018-01-28 ENCOUNTER — Ambulatory Visit: Payer: PPO

## 2018-01-30 ENCOUNTER — Ambulatory Visit: Payer: PPO

## 2018-02-04 ENCOUNTER — Ambulatory Visit: Payer: PPO | Admitting: Physical Therapy

## 2018-02-04 ENCOUNTER — Encounter: Payer: Self-pay | Admitting: Physical Therapy

## 2018-02-04 VITALS — BP 158/64 | HR 85

## 2018-02-04 DIAGNOSIS — M6281 Muscle weakness (generalized): Secondary | ICD-10-CM | POA: Diagnosis not present

## 2018-02-04 DIAGNOSIS — R2681 Unsteadiness on feet: Secondary | ICD-10-CM

## 2018-02-04 DIAGNOSIS — R269 Unspecified abnormalities of gait and mobility: Secondary | ICD-10-CM

## 2018-02-04 NOTE — Therapy (Signed)
[place] MAIN [facility] SERVICES [address] [place], [place], [address] Phone: [phone]   Fax:  [phone]  Physical Therapy Treatment Physical Therapy Progress Note   Dates of reporting period  12/17/2017   to   02/04/2018   Patient Details  Name: Krista Evans MRN: [id number] Date of Birth: 11-27-1945 Referring Provider (PT): Ramonita Lab   Encounter Date: 02/04/2018  PT End of Session - 02/04/18 0920    Visit Number  26    Number of Visits  32    Date for PT Re-Evaluation  02/11/18    Authorization Type  10/10 starting 12/2    PT Start Time  0930    PT Stop Time  1005    PT Time Calculation (min)  35 min    Equipment Utilized During Treatment  Gait belt    Activity Tolerance  Patient tolerated treatment well;Patient limited by fatigue    Behavior During Therapy  [facility] for tasks assessed/performed       Past Medical History:  Diagnosis Date  . Abnormal Q waves on electrocardiogram   . Anemia   . Aortic atherosclerosis ([place])    "I could possibly have it, my grandmother had it"  . Cancer [facility]) 05/2011   Right upper Lung CA with partial Lobectomy.  . Celiac disease   . Celiac disease   . Dyspnea    with exertion  . Hyperlipidemia   . Hyperlipidemia   . Lung mass   . Meningioma ([place])   . Osteoporosis   . Personal history of chemotherapy   . Personal history of radiation therapy     Past Surgical History:  Procedure Laterality Date  . LUNG LOBECTOMY     right lung  . 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 She is no longer having a productive cough. Patient  reports no falls, but has lost her balance when she was recovering from the cold.     Pertinent History  Patient stated she received therapy here about a year ago and went home and did not do a lot of exercise. Patient reports around June 2019 she had an MRI which showed enlarged ventricles and hydrocephalus and shunt was placed. Spent about a week and half in the hospital where she also received therapy. Also received home health PT and OT for about 3 weeks when discharged from hospital but has not done her exercises since. States she is not aware of any shunt precautions but feels the shunt which is described as a sharp pain when she lays late and leans forward. Reports her doctor is currently unaware of these symptoms. Patient states she walks around the house daily and 1x week she walks down the street and back with husband. Patient reports L leg aching after she begins walking to bedroom and back with rollator. Reports this has been happening for a couple years. Patient would like to get stronger, improve balance, and become more independent. Currently requires assistance getting rollator in and out of car.      Limitations  Lifting;Standing;Walking;House hold activities    How long  can you sit comfortably?  n/a    How long can you stand comfortably?  5 minutes    How long can you walk comfortably?  limited but patient unsure of duration     Patient Stated Goals  improve balance, strength, be more independent     Currently in Pain?  No/denies       TREATMENT  Therapeutic Exercise Patient's goals were reassessed today, including:   5x STS: BUE suport required, 23.8 seconds. Patient had 3 losses of balance when initiating the task, first with SUE support, and for other trial with initiation and 3 repetition. Patient requires SBA/CGA today for safety 2/2 to fatigue and increased reports of dizziness due to upper respiratory infection.  Ambulation without AD, 2x200 feet, 1x300 feet. Patient  requires intermittent VCs for increased stride length and gait speed (patient tends toward increased speed). Patient requires SBA/CGA for safety and to decrease fear of falling.   Neuromuscular Re-education  [facility] Adult PT Treatment/Exercise - 02/04/18 0001      Standardized Balance Assessment   Standardized Balance Assessment  Berg Balance Test      Berg Balance Test   Sit to Stand  Able to stand using hands after several tries    Standing Unsupported  Able to stand safely 2 minutes    Sitting with Back Unsupported but Feet Supported on Floor or Stool  Able to sit safely and securely 2 minutes    Stand to Sit  Controls descent by using hands    Transfers  Able to transfer safely, definite need of hands    Standing Unsupported with Eyes Closed  Able to stand 10 seconds safely    Standing Ubsupported with Feet Together  Needs help to attain position but able to stand for 30 seconds with feet together    From Standing, Reach Forward with Outstretched Arm  Can reach forward >12 cm safely (5")    From Standing Position, Pick up Object from Floor  Able to pick up shoe, needs supervision    From Standing Position, Turn to Look Behind Over each Shoulder  Looks behind one side only/other side shows less weight shift    Turn 360 Degrees  Needs assistance while turning   Patient reports dizziness and seeks external support   Standing Unsupported, Alternately Place Feet on Step/Stool  Able to complete >2 steps/needs minimal assist    Standing Unsupported, One Foot in Front  Able to take small step independently and hold 30 seconds    Standing on One Leg  Unable to try or needs assist to prevent fall    Total Score  33      Patient's Berg balance assessment has shown a significant decline since her last goals assessment; however, patient acknowledges that her upper respiratory infection has made her feel dizzy and quickly fatigued.   Patient Response: Patient continues to require SBA/CGA, verbal  and tactile cues for safety. Patient also has deficits in safety awareness, which benefit from skilled monitoring. Patient reports feeling better for having performed her PT, but is also limited 2/2 to fatigue from upper respiratory infection.   PT Short Term Goals - 12/17/17 1049      PT SHORT TERM GOAL #1   Title  Patient will be independent in HEP demonstrating successful between session carryover and improve functional mobility and ease of completing ADLs.    Baseline  10/8: HEP given next session 11/11: HEP compliance    Time  2  Period  Weeks    Status  Achieved      PT SHORT TERM GOAL #2   Title  Patient will be able to transfer sit<>Stand from regular height chair without pushing on arm rests to exhibit improved independence improved functional strength.     Baseline  10/8: BUE support 11/11: hands on knees    Time  2    Period  Weeks    Status  Achieved        PT Long Term Goals - 02/04/18 9147      PT LONG TERM GOAL #1   Title  Patient will ambulate >10101f with rollator during 657m demonstrating improved community ambulation and ease with shopping    Baseline  10/8: 84567f1/11: 890f58f/2: 1030 ft with rollator     Time  8    Period  Weeks    Status  Achieved      PT LONG TERM GOAL #2   Title  Patient (> 58 y95rs old) will complete five times sit to stand test in < 15 seconds indicating an increased LE strength and improved balance.    Baseline  10/8: 26seconds with BUE 11/11: 24 seconds BUE 12/2: 16 seconds with SUE support 02/04/18: 23.8 sec BUE support, 1 LOB inititating test.    Time  8    Period  Weeks    Status  Partially Met    Target Date  02/11/18      PT LONG TERM GOAL #3   Title  Patient will demonstrate an improved Berg Balance Score of >42/56 as to demonstrate improved balance with ADLs such as sitting/standing and transfer balance and reduced fall risk.     Baseline  10/8: 37/56 11/11: 40/56 12/2: 43/56     Time  8    Period  Weeks    Status   Achieved    Target Date  12/18/17      PT LONG TERM GOAL #4   Title  Patient will improve LEFS to 54/80 to demonstrate improved fucnctional strenth and mobility and ease of indepedence    Baseline  10/8: 45/80 11/11: 41/80 12/2: 45/80     Time  8    Period  Weeks    Status  Partially Met      PT LONG TERM GOAL #5   Title  Patient will be able to load and unload rollator indepedently to improve independence and quality of life.     Baseline  10/8: needs assistance to put rollator in and out of car 11/11: requires assistance; 02/04/2018: patient has not attempted    Time  8    Period  Weeks    Status  On-going    Target Date  02/11/18      PT LONG TERM GOAL #6   Title  Patient will demonstrate an improved Berg Balance Score of >49/56 as to demonstrate improved balance with ADLs such as sitting/standing and transfer balance and reduced fall risk.     Baseline  12/2: 43/56; 02/04/18: 33/56 (patient has significant fatigue 2/2 to upper respiratory infection)    Time  8    Period  Weeks    Status  New    Target Date  02/11/18      PT LONG TERM GOAL #7   Title  Patient will demonstrate ability to ambulate without AD 200 ft Independently to increase independence in mobility.     Baseline  12/2: requires rollator 02/04/18: requires CGA/SBA, no AD  Time  8    Period  Weeks    Status  On-going    Target Date  02/11/18            Plan - 02/04/18 1010    Clinical Impression Statement  Patient presents with excellent motivation, but continues to have limitations 2/2 to upper respiratory infection which she is being managed by her MD. Patient had reports of shortness of breath but was able to maintain conversation throughout session. She required more frequent rest breaks and continues to have deficits in gait mechanics, balance, and strength which will benefit from continued skilled therapeutic intervention. Patient has yet to achieve maxiumum benefit, but progress is expected in a  reasonable and generally predictable amount of time.     Rehab Potential  Good    Clinical Impairments Affecting Rehab Potential  (+) improvement made within last year, eagerness to participate (-) HEP compliance, safety awareness     PT Frequency  2x / week    PT Duration  8 weeks    PT Treatment/Interventions  ADLs/Self Care Home Management;Cryotherapy;Electrical Stimulation;Iontophoresis 28m/ml Dexamethasone;Moist Heat;Ultrasound;DME Instruction;Gait training;Stair training;Balance training;Therapeutic exercise;Therapeutic activities;Functional mobility training;Neuromuscular re-education;Patient/family education;Manual techniques;Passive range of motion;Energy conservation    PT Next Visit Plan  ambulate without rollator    PT Home Exercise Plan  sit to stands, standing hip abduction, standing marches, tandem stance    Consulted and Agree with Plan of Care  Patient       Patient will benefit from skilled therapeutic intervention in order to improve the following deficits and impairments:  Abnormal gait, Decreased activity tolerance, Decreased balance, Decreased coordination, Decreased endurance, Decreased mobility, Decreased range of motion, Decreased safety awareness, Decreased strength, Difficulty walking, Impaired flexibility, Impaired perceived functional ability, Improper body mechanics, Postural dysfunction, Pain, Impaired UE functional use, Decreased knowledge of precautions  Visit Diagnosis: Muscle weakness (generalized)  Unsteadiness on feet  Neurologic gait disorder     Problem List Patient Active Problem List   Diagnosis Date Noted  . Cognitive deficits   . Labile blood pressure   . Acute blood loss anemia   . Slow transit constipation   . Vascular headache   . 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 Hydrocephalus ([place] 07/03/2017  . 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 Pelvic fracture ([place] 04/18/2016  . CD (celiac disease) 08/04/2014  . Cancer of [place]  lobe of right lung ([place] 08/04/2014  . Age related osteoporosis 11/26/2013  . Absolute anemia 05/21/2013  . H/O malignant neoplasm 05/21/2013  . HLD (hyperlipidemia) 05/21/2013   KMyles GipPT, DPT #[id number]/20/2020, 10:14 AM  [place] [facility] [address] [address]Phone: [phone]  Fax:  [phone] Name: Krista DRYMRN: [id number] of Birth: 307/14/1947

## 2018-02-06 ENCOUNTER — Ambulatory Visit: Payer: PPO

## 2018-02-06 DIAGNOSIS — M6281 Muscle weakness (generalized): Secondary | ICD-10-CM | POA: Diagnosis not present

## 2018-02-06 DIAGNOSIS — R2681 Unsteadiness on feet: Secondary | ICD-10-CM

## 2018-02-06 DIAGNOSIS — R269 Unspecified abnormalities of gait and mobility: Secondary | ICD-10-CM

## 2018-02-06 NOTE — Therapy (Signed)
[place] MAIN [facility] SERVICES [address] [place], [place], [address] Phone: [phone]   Fax:  [phone]  Physical Therapy Treatment  Patient Details  Name: Krista Evans MRN: [id number] Date of Birth: 08-May-1945 Referring Provider (PT): Ramonita Lab   Encounter Date: 02/06/2018  PT End of Session - 02/06/18 1028    Visit Number  27    Number of Visits  32    Date for PT Re-Evaluation  02/11/18    Authorization Type  1/10 starting 1/20    PT Start Time  1021    PT Stop Time  1109    PT Time Calculation (min)  48 min    Equipment Utilized During Treatment  Gait belt    Activity Tolerance  Patient tolerated treatment well;Patient limited by fatigue    Behavior During Therapy  [facility] for tasks assessed/performed       Past Medical History:  Diagnosis Date  . Abnormal Q waves on electrocardiogram   . Anemia   . Aortic atherosclerosis ([place])    "I could possibly have it, my grandmother had it"  . Cancer [facility]) 05/2011   Right upper Lung CA with partial Lobectomy.  . Celiac disease   . Celiac disease   . Dyspnea    with exertion  . Hyperlipidemia   . Hyperlipidemia   . Lung mass   . Meningioma ([place])   . Osteoporosis   . Personal history of chemotherapy   . Personal history of radiation therapy     Past Surgical History:  Procedure Laterality Date  . LUNG LOBECTOMY     right lung  . 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  Subjective Assessment - 02/06/18 1027    Subjective  Patient reports she isn't coughing as much anymore but still feels out of breath easily and weak from her illness.     Pertinent History  Patient stated she received therapy here about a year ago and went home and did not do a lot of exercise. Patient reports  around June 2019 she had an MRI which showed enlarged ventricles and hydrocephalus and shunt was placed. Spent about a week and half in the hospital where she also received therapy. Also received home health PT and OT for about 3 weeks when discharged from hospital but has not done her exercises since. States she is not aware of any shunt precautions but feels the shunt which is described as a sharp pain when she lays late and leans forward. Reports her doctor is currently unaware of these symptoms. Patient states she walks around the house daily and 1x week she walks down the street and back with husband. Patient reports L leg aching after she begins walking to bedroom and back with rollator. Reports this has been happening for a couple years. Patient would like to get stronger, improve balance, and become more independent. Currently requires assistance getting rollator in and out of car.      Limitations  Lifting;Standing;Walking;House hold activities    How long can you sit comfortably?  n/a    How long can you stand comfortably?  5 minutes    How long can you walk comfortably?  limited but patient unsure of duration     Patient Stated Goals  improve balance, strength, be more independent  Currently in Pain?  No/denies      TREATMENT: Nustep lvl 3 3 minutes >60rpm for cardiovascular challenge. Verbal cues to maintain speed      Neuro-reeducation:  In // bars: All standing interventions require CGA due to instability and limited capacity for prolonged stability on LLE. Verbal cueing and encouragement for task progression/ body mechanics required.     Airex pad: modified tandem stance x2 ea side, 30sec holds with no UE support, one foot on each color, no LOB   airex pad: balloon taps reaching inside and outside BOS 2 minutes. Able to reach with LUE    airex pad: 3lb weight: bench forward and back 10x , straight arm raise 10x. Good ability to respond to pertubations   Therapeutic  exercise:     Leg press 2x10 115lbs. Verbal cues to complete slowly and not let knees snap back. Cues to keep knees from adduction too much. Focus on equal force through each LE. 2nd set complete without tactile cueing with patient able to maintain appropriate technique.     Leg press: single leg: 65 lb x10, verbal cues for abducting knees to promote neutral body alignment. LLE more challenged than RLE, pt performed to fatigue, only able to perform x10. ;  Ambulate without AD in hallway with verbal cues for improving forward momentum 1100 ft with. Verbal cues and tactile cues for L arm swing. Utilization of LUE to pushing button for doors to open, frequent stuttering/shuffling when attempting to open door.  pt anxious and needed mod encouragement. Cues for stride length.    Sp02 monitored requiring patient to perform rest breaks to remain in the 90's Sp02.    Patient educated on purse lipped breathing for respiratory capacity and relaxation.                            PT Education - 02/06/18 1027    Education provided  Yes    Education Details  exercise technique, stability     Person(s) Educated  Patient    Methods  Explanation;Demonstration;Tactile cues;Verbal cues    Comprehension  Verbalized understanding;Returned demonstration;Verbal cues required;Tactile cues required       PT Short Term Goals - 12/17/17 1049      PT SHORT TERM GOAL #1   Title  Patient will be independent in HEP demonstrating successful between session carryover and improve functional mobility and ease of completing ADLs.    Baseline  10/8: HEP given next session 11/11: HEP compliance    Time  2    Period  Weeks    Status  Achieved      PT SHORT TERM GOAL #2   Title  Patient will be able to transfer sit<>Stand from regular height chair without pushing on arm rests to exhibit improved independence improved functional strength.     Baseline  10/8: BUE support 11/11: hands on knees    Time   2    Period  Weeks    Status  Achieved        PT Long Term Goals - 02/04/18 0934      PT LONG TERM GOAL #1   Title  Patient will ambulate >1093f with rollator during 656m demonstrating improved community ambulation and ease with shopping    Baseline  10/8: 84594f1/11: 890f12f/2: 1030 ft with rollator     Time  8    Period  Weeks    Status  Achieved  PT LONG TERM GOAL #2   Title  Patient (> 7 years old) will complete five times sit to stand test in < 15 seconds indicating an increased LE strength and improved balance.    Baseline  10/8: 26seconds with BUE 11/11: 24 seconds BUE 12/2: 16 seconds with SUE support 02/04/18: 23.8 sec BUE support, 1 LOB inititating test.    Time  8    Period  Weeks    Status  Partially Met    Target Date  02/11/18      PT LONG TERM GOAL #3   Title  Patient will demonstrate an improved Berg Balance Score of >42/56 as to demonstrate improved balance with ADLs such as sitting/standing and transfer balance and reduced fall risk.     Baseline  10/8: 37/56 11/11: 40/56 12/2: 43/56     Time  8    Period  Weeks    Status  Achieved    Target Date  12/18/17      PT LONG TERM GOAL #4   Title  Patient will improve LEFS to 54/80 to demonstrate improved fucnctional strenth and mobility and ease of indepedence    Baseline  10/8: 45/80 11/11: 41/80 12/2: 45/80     Time  8    Period  Weeks    Status  Partially Met      PT LONG TERM GOAL #5   Title  Patient will be able to load and unload rollator indepedently to improve independence and quality of life.     Baseline  10/8: needs assistance to put rollator in and out of car 11/11: requires assistance; 02/04/2018: patient has not attempted    Time  8    Period  Weeks    Status  On-going    Target Date  02/11/18      PT LONG TERM GOAL #6   Title  Patient will demonstrate an improved Berg Balance Score of >49/56 as to demonstrate improved balance with ADLs such as sitting/standing and transfer balance and  reduced fall risk.     Baseline  12/2: 43/56; 02/04/18: 33/56 (patient has significant fatigue 2/2 to upper respiratory infection)    Time  8    Period  Weeks    Status  New    Target Date  02/11/18      PT LONG TERM GOAL #7   Title  Patient will demonstrate ability to ambulate without AD 200 ft Independently to increase independence in mobility.     Baseline  12/2: requires rollator 02/04/18: requires CGA/SBA, no AD    Time  8    Period  Weeks    Status  On-going    Target Date  02/11/18            Plan - 02/06/18 1102    Clinical Impression Statement  Patient presents with limited capacity of mobility from shortness of breath however remained motivated throughout session. Sp02 and HR monitored throughout session with frequent rest breaks required for vitals to remain in functional range. The patient would benefit from further skilled PT to continue to progress towards goals and to maximize mobility, independence, and safety.    Rehab Potential  Good    Clinical Impairments Affecting Rehab Potential  (+) improvement made within last year, eagerness to participate (-) HEP compliance, safety awareness     PT Frequency  2x / week    PT Duration  8 weeks    PT Treatment/Interventions  ADLs/Self Care Home Management;Cryotherapy;Dealer  Stimulation;Iontophoresis 93m/ml Dexamethasone;Moist Heat;Ultrasound;DME Instruction;Gait training;Stair training;Balance training;Therapeutic exercise;Therapeutic activities;Functional mobility training;Neuromuscular re-education;Patient/family education;Manual techniques;Passive range of motion;Energy conservation    PT Next Visit Plan  ambulate without rollator    PT Home Exercise Plan  sit to stands, standing hip abduction, standing marches, tandem stance    Consulted and Agree with Plan of Care  Patient       Patient will benefit from skilled therapeutic intervention in order to improve the following deficits and impairments:  Abnormal gait,  Decreased activity tolerance, Decreased balance, Decreased coordination, Decreased endurance, Decreased mobility, Decreased range of motion, Decreased safety awareness, Decreased strength, Difficulty walking, Impaired flexibility, Impaired perceived functional ability, Improper body mechanics, Postural dysfunction, Pain, Impaired UE functional use, Decreased knowledge of precautions  Visit Diagnosis: Muscle weakness (generalized)  Unsteadiness on feet  Neurologic gait disorder     Problem List Patient Active Problem List   Diagnosis Date Noted  . Cognitive deficits   . Labile blood pressure   . Acute blood loss anemia   . Slow transit constipation   . Vascular headache   . 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 Hydrocephalus ([place] 07/03/2017  . 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 Pelvic fracture ([place] 04/18/2016  . CD (celiac disease) 08/04/2014  . Cancer of upper lobe of right lung ([place] 08/04/2014  . Age related osteoporosis 11/26/2013  . Absolute anemia 05/21/2013  . H/O malignant neoplasm 05/21/2013  . HLD (hyperlipidemia) 05/21/2013   MJanna Arch PT, DPT   02/06/2018, 11:10 AM  [place] [facility] [address] [place] [address]Phone: [phone]  Fax:  [phone] Name: Krista BEASTONMRN: [id number] of Birth: 312-18-1947

## 2018-02-11 ENCOUNTER — Ambulatory Visit: Payer: PPO

## 2018-02-11 DIAGNOSIS — M6281 Muscle weakness (generalized): Secondary | ICD-10-CM

## 2018-02-11 DIAGNOSIS — R2681 Unsteadiness on feet: Secondary | ICD-10-CM

## 2018-02-11 NOTE — Therapy (Signed)
[place] MAIN [facility] SERVICES [address] [place], [place], [address] Phone: [phone]   Fax:  [phone]  Physical Therapy Treatment/RECERT   Patient Details  Name: Krista Evans MRN: [id number] Date of Birth: [birth year] Referring Provider (PT): Ramonita Lab   Encounter Date: 02/11/2018  PT End of Session - 02/11/18 1020    Visit Number  28    Number of Visits  44    Date for PT Re-Evaluation  04/08/18    Authorization Type  2/10 starting 1/20    PT Start Time  1024    PT Stop Time  1110    PT Time Calculation (min)  46 min    Equipment Utilized During Treatment  Gait belt    Activity Tolerance  Patient tolerated treatment well;Patient limited by fatigue    Behavior During Therapy  [facility] for tasks assessed/performed       Past Medical History:  Diagnosis Date  . Abnormal Q waves on electrocardiogram   . Anemia   . Aortic atherosclerosis ([place])    "I could possibly have it, my grandmother had it"  . Cancer [facility]) 05/2011   Right upper Lung CA with partial Lobectomy.  . Celiac disease   . Celiac disease   . Dyspnea    with exertion  . Hyperlipidemia   . Hyperlipidemia   . Lung mass   . Meningioma ([place])   . Osteoporosis   . Personal history of chemotherapy   . Personal history of radiation therapy     Past Surgical History:  Procedure Laterality Date  . LUNG LOBECTOMY     right lung  . 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  Subjective Assessment - 02/11/18 1758    Subjective  Patient reports she is doing a little better but still feels very out of breathe and can't move as much due to fatigue from recent illness.     Pertinent History  Patient stated she received therapy here about a year ago and went home and did not do a  lot of exercise. Patient reports around June 2019 she had an MRI which showed enlarged ventricles and hydrocephalus and shunt was placed. Spent about a week and half in the hospital where she also received therapy. Also received home health PT and OT for about 3 weeks when discharged from hospital but has not done her exercises since. States she is not aware of any shunt precautions but feels the shunt which is described as a sharp pain when she lays late and leans forward. Reports her doctor is currently unaware of these symptoms. Patient states she walks around the house daily and 1x week she walks down the street and back with husband. Patient reports L leg aching after she begins walking to bedroom and back with rollator. Reports this has been happening for a couple years. Patient would like to get stronger, improve balance, and become more independent. Currently requires assistance getting rollator in and out of car.      Limitations  Lifting;Standing;Walking;House hold activities    How long can you sit comfortably?  n/a    How long can you stand comfortably?  5 minutes    How long can you walk comfortably?  limited but patient unsure of duration     Patient Stated Goals  improve balance, strength, be more independent     Currently in Pain?  No/denies       5x STS: 13 seconds SUE support, slight LOB on third LEFS: 48   TREATMENT:    Neuro-reeducation:  In // bars: All standing interventions require CGA due to instability and limited capacity for prolonged stability on LLE. Verbal cueing and encouragement for task progression/ body mechanics required.     Airex pad: modified tandem stance x2 ea side, 30sec holds with no UE support, one foot on each color, no LOB    airex pad: balloon taps reaching inside and outside BOS 2 minutes. Able to reach with LUE    airex pad: 3lb weight: bench forward and back 10x , straight arm raise 10x. Good ability to respond to perturbations   Therapeutic  exercise:   Leg press 2x10 115lbs. Verbal cues to complete slowly and not let knees snap back. Cues to keep knees from adduction too much. Focus on equal force through each LE. 2nd set complete without tactile cueing with patient able to maintain appropriate technique.     Leg press: single leg: 65 lb x10, verbal cues for abducting knees to promote neutral body alignment. LLE more challenged than RLE, pt performed to fatigue, only able to perform x10. ;   Ambulate without AD in hallway with verbal cues for improving forward momentum 800 ft with. Verbal cues and tactile cues for L arm swing. Utilization of LUE to pushing button for doors to open, frequent stuttering/shuffling when attempting to open door.  pt anxious and needed mod encouragement. Cues for stride length.    Sp02 monitored requiring patient to perform rest breaks to remain in the 90's Sp02.    Patient educated on purse lipped breathing for respiratory capacity and relaxation.                            PT Education - 02/11/18 1758    Education provided  Yes    Education Details  goals, breathing technique, stability     Person(s) Educated  Patient    Methods  Explanation;Demonstration;Tactile cues;Verbal cues    Comprehension  Verbalized understanding;Returned demonstration;Tactile cues required;Need further instruction       PT Short Term Goals - 02/11/18 1032      PT SHORT TERM GOAL #1   Title  Patient will be independent in HEP demonstrating successful between session carryover and improve functional mobility and ease of completing ADLs.    Baseline  10/8: HEP given next session 11/11: HEP compliance    Time  2    Period  Weeks    Status  Achieved      PT SHORT TERM GOAL #2   Title  Patient will be able to transfer sit<>Stand from regular height chair without pushing on arm rests to exhibit improved independence improved functional strength.     Baseline  10/8: BUE support 11/11: hands on  knees    Time  2    Period  Weeks    Status  Achieved        PT Long Term Goals - 02/11/18 1032      PT LONG TERM GOAL #1   Title  Patient will ambulate >1080f with rollator during 753m demonstrating improved community ambulation and ease with shopping    Baseline  10/8: 84559f1/11: 890f57f/2: 1030 ft with rollator     Time  8  Period  Weeks    Status  Achieved      PT LONG TERM GOAL #2   Title  Patient (> 75 years old) will complete five times sit to stand test in < 15 seconds indicating an increased LE strength and improved balance.    Baseline  10/8: 26seconds with BUE 11/11: 24 seconds BUE 12/2: 16 seconds with SUE support 02/04/18: 23.8 sec BUE support, 1 LOB inititating test.1/27: 13 seconds SUE support, 1 episode of LOB     Time  8    Period  Weeks    Status  Partially Met    Target Date  04/08/18      PT LONG TERM GOAL #3   Title  Patient will demonstrate an improved Berg Balance Score of >42/56 as to demonstrate improved balance with ADLs such as sitting/standing and transfer balance and reduced fall risk.     Baseline  10/8: 37/56 11/11: 40/56 12/2: 43/56     Time  8    Period  Weeks    Status  Achieved      PT LONG TERM GOAL #4   Title  Patient will improve LEFS to 54/80 to demonstrate improved fucnctional strenth and mobility and ease of indepedence    Baseline  10/8: 45/80 11/11: 41/80 12/2: 45/80 1/27:  48/80    Time  8    Period  Weeks    Status  Partially Met    Target Date  04/08/18      PT LONG TERM GOAL #5   Title  Patient will be able to load and unload rollator indepedently to improve independence and quality of life.     Baseline  10/8: needs assistance to put rollator in and out of car 11/11: requires assistance; 02/04/2018: patient has not attempted 1/27: has not attempted due to illness    Time  8    Period  Weeks    Status  On-going    Target Date  04/08/18      PT LONG TERM GOAL #6   Title  Patient will demonstrate an improved Berg  Balance Score of >49/56 as to demonstrate improved balance with ADLs such as sitting/standing and transfer balance and reduced fall risk.     Baseline  12/2: 43/56; 02/04/18: 33/56 (patient has significant fatigue 2/2 to upper respiratory infection)    Time  8    Period  Weeks    Status  On-going    Target Date  04/08/18      PT LONG TERM GOAL #7   Title  Patient will demonstrate ability to ambulate without AD 200 ft Independently to increase independence in mobility.     Baseline  12/2: requires rollator 02/04/18: requires CGA/SBA, no AD 1/27: requires CGA without AD     Time  8    Period  Weeks    Status  On-going    Target Date  04/08/18            Plan - 02/11/18 1800    Clinical Impression Statement  Patient progressing with functional goals. Patient was unable to perform all goals this session due to recent illness resulting in prolonged limited capacity for breathing resulting in need for frequent rest breaks. 5x STS is improving in speed and body mechanics. Patient has yet to achieve maxiumum benefit, but progress is expected in a reasonable and generally predictable amount of time. The patient would benefit from further skilled PT to continue to progress towards goals  and to maximize mobility, independence, and safety.    Rehab Potential  Good    Clinical Impairments Affecting Rehab Potential  (+) improvement made within last year, eagerness to participate (-) HEP compliance, safety awareness     PT Frequency  2x / week    PT Duration  8 weeks    PT Treatment/Interventions  ADLs/Self Care Home Management;Cryotherapy;Electrical Stimulation;Iontophoresis 88m/ml Dexamethasone;Moist Heat;Ultrasound;DME Instruction;Gait training;Stair training;Balance training;Therapeutic exercise;Therapeutic activities;Functional mobility training;Neuromuscular re-education;Patient/family education;Manual techniques;Passive range of motion;Energy conservation    PT Next Visit Plan  ambulate without  rollator    PT Home Exercise Plan  sit to stands, standing hip abduction, standing marches, tandem stance    Consulted and Agree with Plan of Care  Patient       Patient will benefit from skilled therapeutic intervention in order to improve the following deficits and impairments:  Abnormal gait, Decreased activity tolerance, Decreased balance, Decreased coordination, Decreased endurance, Decreased mobility, Decreased range of motion, Decreased safety awareness, Decreased strength, Difficulty walking, Impaired flexibility, Impaired perceived functional ability, Improper body mechanics, Postural dysfunction, Pain, Impaired UE functional use, Decreased knowledge of precautions  Visit Diagnosis: Muscle weakness (generalized)  Unsteadiness on feet     Problem List Patient Active Problem List   Diagnosis Date Noted  . Cognitive deficits   . Labile blood pressure   . Acute blood loss anemia   . Slow transit constipation   . Vascular headache   . 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 Hydrocephalus ([place] 07/03/2017  . 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 Pelvic fracture ([place] 04/18/2016  . CD (celiac disease) 08/04/2014  . Cancer of upper lobe of right lung ([place] 08/04/2014  . Age related osteoporosis 11/26/2013  . Absolute anemia 05/21/2013  . H/O malignant neoplasm 05/21/2013  . HLD (hyperlipidemia) 05/21/2013   MJanna Arch PT, DPT   02/11/2018, 6:02 PM  [place] [facility] [address] [place] [address]Phone: [phone]  Fax:  [phone] Name: MCHRISMA HURLOCKMRN: [id number] of Birth: 306/10/1945

## 2018-02-14 ENCOUNTER — Ambulatory Visit: Payer: PPO

## 2018-02-14 DIAGNOSIS — M6281 Muscle weakness (generalized): Secondary | ICD-10-CM

## 2018-02-14 DIAGNOSIS — R2681 Unsteadiness on feet: Secondary | ICD-10-CM

## 2018-02-14 DIAGNOSIS — R269 Unspecified abnormalities of gait and mobility: Secondary | ICD-10-CM

## 2018-02-14 NOTE — Therapy (Signed)
[place] MAIN [facility] SERVICES [address] [place], [place], [address] Phone: [phone]   Fax:  [phone]  Physical Therapy Treatment  Patient Details  Name: Krista Evans MRN: [id number] Date of Birth: Jun 08, 1945 Referring Provider (PT): Ramonita Lab   Encounter Date: 02/14/2018  PT End of Session - 02/14/18 1018    Visit Number  29    Number of Visits  44    Date for PT Re-Evaluation  04/08/18    Authorization Type  2/10 starting 1/20    PT Start Time  1014    PT Stop Time  1054    PT Time Calculation (min)  40 min    Activity Tolerance  Patient tolerated treatment well;Patient limited by fatigue    Behavior During Therapy  [facility] for tasks assessed/performed       Past Medical History:  Diagnosis Date  . Abnormal Q waves on electrocardiogram   . Anemia   . Aortic atherosclerosis ([place])    "I could possibly have it, my grandmother had it"  . Cancer [facility]) 05/2011   Right upper Lung CA with partial Lobectomy.  . Celiac disease   . Celiac disease   . Dyspnea    with exertion  . Hyperlipidemia   . Hyperlipidemia   . Lung mass   . Meningioma ([place])   . Osteoporosis   . Personal history of chemotherapy   . Personal history of radiation therapy     Past Surgical History:  Procedure Laterality Date  . LUNG LOBECTOMY     right lung  . 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  Subjective Assessment - 02/14/18 1017    Subjective  Pt doign well this date. Reports DOE in general has been not as bad at home recently. HEP going well, but she requires breaks.     Pertinent History  Patient stated she received therapy here about a year ago and went home and did not do a lot of exercise. Patient reports around June 2019 she had an MRI which  showed enlarged ventricles and hydrocephalus and shunt was placed. Spent about a week and half in the hospital where she also received therapy. Also received home health PT and OT for about 3 weeks when discharged from hospital but has not done her exercises since. States she is not aware of any shunt precautions but feels the shunt which is described as a sharp pain when she lays late and leans forward. Reports her doctor is currently unaware of these symptoms. Patient states she walks around the house daily and 1x week she walks down the street and back with husband. Patient reports L leg aching after she begins walking to bedroom and back with rollator. Reports this has been happening for a couple years. Patient would like to get stronger, improve balance, and become more independent. Currently requires assistance getting rollator in and out of car.          Intervention this date.    Neuro-reeducation:  In // bars: All standing interventions require CGA due to instability and limited capacity for prolonged stability on LLE. Verbal cueing and encouragement for task progression/ body mechanics required.    -Airex pad: modified tandem stance x2 ea side, 30sec holds with no UE support, one foot on each color, no  LOB  -airex pad: normal stance trunk rotation with physioball 10x q side, alternating -airex, normal stance, overhead reach c physio ball (small green) 10x, VC for upward gaze -airex pad: 3.5lb straight weight: forward and back (chest press) 15x , straight arm raise (bilat shoulder flexion) 10x. No LOB, flexion limited d/t difficulty controlling elbow angle -normal stance self ball toss 1x15 firm surface, (RUE toss, BUE catch) -normal stance self ball toss 1x10 foam surface, (RUE toss, BUE catch)    Therapeutic exercise: -Leg press 2x10 115lbs, Seathole #7 (excellent eccentric control throughout without VC cues) -overground FWD AMB without assistive device, MinGuardAssist for safety 3x40  feet -overground sidestepping 1x16f bilat             PT Short Term Goals - 02/11/18 1032      PT SHORT TERM GOAL #1   Title  Patient will be independent in HEP demonstrating successful between session carryover and improve functional mobility and ease of completing ADLs.    Baseline  10/8: HEP given next session 11/11: HEP compliance    Time  2    Period  Weeks    Status  Achieved      PT SHORT TERM GOAL #2   Title  Patient will be able to transfer sit<>Stand from regular height chair without pushing on arm rests to exhibit improved independence improved functional strength.     Baseline  10/8: BUE support 11/11: hands on knees    Time  2    Period  Weeks    Status  Achieved        PT Long Term Goals - 02/11/18 1032      PT LONG TERM GOAL #1   Title  Patient will ambulate >1002fwith rollator during 25m34mdemonstrating improved community ambulation and ease with shopping    Baseline  10/8: 845f94f/11: 890ft45f2: 1030 ft with rollator     Time  8    Period  Weeks    Status  Achieved      PT LONG TERM GOAL #2   Title  Patient (> 60 ye42s old) will complete five times sit to stand test in < 15 seconds indicating an increased LE strength and improved balance.    Baseline  10/8: 26seconds with BUE 11/11: 24 seconds BUE 12/2: 16 seconds with SUE support 02/04/18: 23.8 sec BUE support, 1 LOB inititating test.1/27: 13 seconds SUE support, 1 episode of LOB     Time  8    Period  Weeks    Status  Partially Met    Target Date  04/08/18      PT LONG TERM GOAL #3   Title  Patient will demonstrate an improved Berg Balance Score of >42/56 as to demonstrate improved balance with ADLs such as sitting/standing and transfer balance and reduced fall risk.     Baseline  10/8: 37/56 11/11: 40/56 12/2: 43/56     Time  8    Period  Weeks    Status  Achieved      PT LONG TERM GOAL #4   Title  Patient will improve LEFS to 54/80 to demonstrate improved fucnctional strenth and  mobility and ease of indepedence    Baseline  10/8: 45/80 11/11: 41/80 12/2: 45/80 1/27:  48/80    Time  8    Period  Weeks    Status  Partially Met    Target Date  04/08/18      PT LONG TERM GOAL #5  Title  Patient will be able to load and unload rollator indepedently to improve independence and quality of life.     Baseline  10/8: needs assistance to put rollator in and out of car 11/11: requires assistance; 02/04/2018: patient has not attempted 1/27: has not attempted due to illness    Time  8    Period  Weeks    Status  On-going    Target Date  04/08/18      PT LONG TERM GOAL #6   Title  Patient will demonstrate an improved Berg Balance Score of >49/56 as to demonstrate improved balance with ADLs such as sitting/standing and transfer balance and reduced fall risk.     Baseline  12/2: 43/56; 02/04/18: 33/56 (patient has significant fatigue 2/2 to upper respiratory infection)    Time  8    Period  Weeks    Status  On-going    Target Date  04/08/18      PT LONG TERM GOAL #7   Title  Patient will demonstrate ability to ambulate without AD 200 ft Independently to increase independence in mobility.     Baseline  12/2: requires rollator 02/04/18: requires CGA/SBA, no AD 1/27: requires CGA without AD     Time  8    Period  Weeks    Status  On-going    Target Date  04/08/18            Plan - 02/14/18 1046    Clinical Impression Statement  Continued with current program this date, static and dynamic balnce on variable surfaces and with coordianted gaze and trunk movement. Pt able to increase volume and intensity of balance activity with good tolerance, most challenged by self ball toss on account of LUE deficits. Pt progressing well, motivated to try new activity even whence somewhat difficulty or unfamiliar at times.     Rehab Potential  Good    Clinical Impairments Affecting Rehab Potential  (+) improvement made within last year, eagerness to participate (-) HEP compliance, safety  awareness     PT Frequency  2x / week    PT Duration  8 weeks    PT Treatment/Interventions  ADLs/Self Care Home Management;Cryotherapy;Electrical Stimulation;Iontophoresis 21m/ml Dexamethasone;Moist Heat;Ultrasound;DME Instruction;Gait training;Stair training;Balance training;Therapeutic exercise;Therapeutic activities;Functional mobility training;Neuromuscular re-education;Patient/family education;Manual techniques;Passive range of motion;Energy conservation    PT Next Visit Plan  Continue with program. Trial again AMB without rollator to challenge balance.     PT Home Exercise Plan  sit to stands, standing hip abduction, standing marches, tandem stance    Consulted and Agree with Plan of Care  Patient       Patient will benefit from skilled therapeutic intervention in order to improve the following deficits and impairments:  Abnormal gait, Decreased activity tolerance, Decreased balance, Decreased coordination, Decreased endurance, Decreased mobility, Decreased range of motion, Decreased safety awareness, Decreased strength, Difficulty walking, Impaired flexibility, Impaired perceived functional ability, Improper body mechanics, Postural dysfunction, Pain, Impaired UE functional use, Decreased knowledge of precautions  Visit Diagnosis: Muscle weakness (generalized)  Unsteadiness on feet  Neurologic gait disorder     Problem List Patient Active Problem List   Diagnosis Date Noted  . Cognitive deficits   . Labile blood pressure   . Acute blood loss anemia   . Slow transit constipation   . Vascular headache   . 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 Hydrocephalus ([place] 07/03/2017  . 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 Pelvic fracture ([place] 04/18/2016  . CD (celiac disease)  08/04/2014  . Cancer of upper lobe of right lung ([place]) 08/04/2014  . Age related osteoporosis 11/26/2013  . Absolute anemia 05/21/2013  . H/O malignant neoplasm 05/21/2013  . HLD (hyperlipidemia)  05/21/2013   10:57 AM, 02/14/18 Etta Grandchild, PT, DPT Physical Therapist - [facility]  Outpatient Physical [place] [phone]     Etta Grandchild 02/14/2018, 10:50 AM  [place] MAIN [facility] SERVICES [address] [place], [place], [address] Phone: [phone]   Fax:  [phone]  Name: Krista Evans MRN: [id number] Date of Birth: 26-Apr-1945

## 2018-02-18 ENCOUNTER — Ambulatory Visit: Payer: PPO | Attending: Internal Medicine

## 2018-02-18 DIAGNOSIS — R2681 Unsteadiness on feet: Secondary | ICD-10-CM | POA: Insufficient documentation

## 2018-02-18 DIAGNOSIS — R269 Unspecified abnormalities of gait and mobility: Secondary | ICD-10-CM | POA: Diagnosis not present

## 2018-02-18 DIAGNOSIS — M6281 Muscle weakness (generalized): Secondary | ICD-10-CM | POA: Insufficient documentation

## 2018-02-18 NOTE — Therapy (Addendum)
[place] MAIN [facility] SERVICES [address] [place], [place], [address] Phone: [phone]   Fax:  [phone]  Physical Therapy Treatment  Patient Details  Name: Krista Evans MRN: [id number] Date of Birth: Sep 21, 1945 Referring Provider (PT): Ramonita Lab   Encounter Date: 02/18/2018  PT End of Session - 02/18/18 1048    Visit Number  30    Number of Visits  44    Date for PT Re-Evaluation  04/08/18    Authorization Type  4/10 starting 1/20    PT Start Time  1050    PT Stop Time  1136    PT Time Calculation (min)  46 min    Equipment Utilized During Treatment  Gait belt    Activity Tolerance  Patient tolerated treatment well;Patient limited by fatigue    Behavior During Therapy  [facility] for tasks assessed/performed       Past Medical History:  Diagnosis Date  . Abnormal Q waves on electrocardiogram   . Anemia   . Aortic atherosclerosis ([place])    "I could possibly have it, my grandmother had it"  . Cancer [facility]) 05/2011   Right upper Lung CA with partial Lobectomy.  . Celiac disease   . Celiac disease   . Dyspnea    with exertion  . Hyperlipidemia   . Hyperlipidemia   . Lung mass   . Meningioma ([place])   . Osteoporosis   . Personal history of chemotherapy   . Personal history of radiation therapy     Past Surgical History:  Procedure Laterality Date  . LUNG LOBECTOMY     right lung  . 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  Subjective Assessment - 02/18/18 1140    Subjective  Patient reports that she is doing well today. Patient denies stumbles/falls since previous session. Patient reports that her HEP has been going well, but is seeking more exercises that she can add in and perform at home in standing.    Pertinent History  Patient  stated she received therapy here about a year ago and went home and did not do a lot of exercise. Patient reports around June 2019 she had an MRI which showed enlarged ventricles and hydrocephalus and shunt was placed. Spent about a week and half in the hospital where she also received therapy. Also received home health PT and OT for about 3 weeks when discharged from hospital but has not done her exercises since. States she is not aware of any shunt precautions but feels the shunt which is described as a sharp pain when she lays late and leans forward. Reports her doctor is currently unaware of these symptoms. Patient states she walks around the house daily and 1x week she walks down the street and back with husband. Patient reports L leg aching after she begins walking to bedroom and back with rollator. Reports this has been happening for a couple years. Patient would like to get stronger, improve balance, and become more independent. Currently requires assistance getting rollator in and out of car.      Limitations  Lifting;Standing;Walking;House hold activities    How long can you sit comfortably?  n/a    How long can you stand comfortably?  5 minutes    How long can you walk comfortably?  limited but  patient unsure of duration     Patient Stated Goals  improve balance, strength, be more independent     Currently in Pain?  No/denies         Neuro-reeducation:  In // bars: All standing interventions require CGA due to instability and limited capacity for prolonged stability on LLE. Verbal cueing and encouragement for task progression/ body mechanics required.    -Airex pad: feet together stance 30 seconds x2 without UE support (holding onto ball), no LOB; patient requires verbal cues to bring feet even closer together   -Airex pad: normal stance alternating trunk rotation with physioball reaching approximately 18 inches outside of base of support 10x each side; patient benefits from verbal cues for  pace/rythm and from external target of PT and SPT hands  -Airex pad: feet together stance, overhead reach with rainbow colored physio ball 10x  -Airex pad: normal stance, horizontal head turns x10 each side x1 holding onto ball --> feet together stance, horizontal head turns x10 to each side x1 holding onto all; patient requires frequent verbal cues to only move head  -Airex pad: normal stance, vertical head turns x10 up/down x1 holding onto ball --> feet together stance, vertical head turns x10 up/down x1 holding onto ball; patient requires frequent verbal cues to only move head; patient notes that vertical direction is more difficult than horizontal direction  -Airex pad: normal stance rainbow ball toss outside of BOS 1x60 seconds with PT as SPT guards; patient requires occasional verbal cues to use LUE with throwing/catching   Therapeutic exercise: -Leg press 2x10 115lbs, [place] #7; LLE weakness (patient does not require verbal cues for proper RLE position; however, patient does rarely require verbal cues to prevent uncontrolled locking of R knee at terminal extension)  -ambulation without assistive device in gym over even ground, Min Guard Assist for safety 2x20 feet  -Standing hip abduction in parallel bars 1x10 each side; patient requires verbal cue to keep R toe pointed forward during performance on R side  -Seated plantarflexion against green TB with SPT holding 1x10 each side; patient indicates it it much more difficult to use/control the motion with LLE     PT Education - 02/18/18 1047    Education provided  Yes    Education Details  balance, stability, breathing technique    Person(s) Educated  Patient    Methods  Explanation;Demonstration;Tactile cues;Verbal cues    Comprehension  Verbalized understanding;Returned demonstration;Verbal cues required;Need further instruction;Tactile cues required       PT Short Term Goals - 02/11/18 1032      PT SHORT TERM GOAL #1    Title  Patient will be independent in HEP demonstrating successful between session carryover and improve functional mobility and ease of completing ADLs.    Baseline  10/8: HEP given next session 11/11: HEP compliance    Time  2    Period  Weeks    Status  Achieved      PT SHORT TERM GOAL #2   Title  Patient will be able to transfer sit<>Stand from regular height chair without pushing on arm rests to exhibit improved independence improved functional strength.     Baseline  10/8: BUE support 11/11: hands on knees    Time  2    Period  Weeks    Status  Achieved        PT Long Term Goals - 02/11/18 1032      PT LONG TERM GOAL #1   Title  Patient  will ambulate >1023f with rollator during 69m demonstrating improved community ambulation and ease with shopping    Baseline  10/8: 84588f1/11: [id number]/2: 1030 ft with rollator     Time  8    Period  Weeks    Status  Achieved      PT LONG TERM GOAL #2   Title  Patient (> 58 y47rs old) will complete five times sit to stand test in < 15 seconds indicating an increased LE strength and improved balance.    Baseline  10/8: 26seconds with BUE 11/11: 24 seconds BUE 12/2: 16 seconds with SUE support 02/04/18: 23.8 sec BUE support, 1 LOB inititating test.1/27: 13 seconds SUE support, 1 episode of LOB     Time  8    Period  Weeks    Status  Partially Met    Target Date  04/08/18      PT LONG TERM GOAL #3   Title  Patient will demonstrate an improved Berg Balance Score of >42/56 as to demonstrate improved balance with ADLs such as sitting/standing and transfer balance and reduced fall risk.     Baseline  10/8: 37/56 11/11: 40/56 12/2: 43/56     Time  8    Period  Weeks    Status  Achieved      PT LONG TERM GOAL #4   Title  Patient will improve LEFS to 54/80 to demonstrate improved fucnctional strenth and mobility and ease of indepedence    Baseline  10/8: 45/80 11/11: 41/80 12/2: 45/80 1/27:  48/80    Time  8    Period  Weeks    Status   Partially Met    Target Date  04/08/18      PT LONG TERM GOAL #5   Title  Patient will be able to load and unload rollator indepedently to improve independence and quality of life.     Baseline  10/8: needs assistance to put rollator in and out of car 11/11: requires assistance; 02/04/2018: patient has not attempted 1/27: has not attempted due to illness    Time  8    Period  Weeks    Status  On-going    Target Date  04/08/18      PT LONG TERM GOAL #6   Title  Patient will demonstrate an improved Berg Balance Score of >49/56 as to demonstrate improved balance with ADLs such as sitting/standing and transfer balance and reduced fall risk.     Baseline  12/2: 43/56; 02/04/18: 33/56 (patient has significant fatigue 2/2 to upper respiratory infection)    Time  8    Period  Weeks    Status  On-going    Target Date  04/08/18      PT LONG TERM GOAL #7   Title  Patient will demonstrate ability to ambulate without AD 200 ft Independently to increase independence in mobility.     Baseline  12/2: requires rollator 02/04/18: requires CGA/SBA, no AD 1/27: requires CGA without AD     Time  8    Period  Weeks    Status  On-going    Target Date  04/08/18            Plan - 02/18/18 1208    Clinical Impression Statement  The patient was motivated throughout today's session and acknowledged when an activity was challenging for her. The patient continues to demonstrate and verbalize uncertainty with her balance. However, she was able to successfully progress to feet  together stance during balance activity on that Airex pad today. The patient also demonstrates LLE > RLE plantarflexion weakness during performance of plantarflexion against resistance of theraband. The patient will continue to benefit from skilled PT in order to progress towards goals and to maximize safety and independence with functional mobility.    Rehab Potential  Good    Clinical Impairments Affecting Rehab Potential  (+) improvement  made within last year, eagerness to participate (-) HEP compliance, safety awareness     PT Frequency  2x / week    PT Duration  8 weeks    PT Treatment/Interventions  ADLs/Self Care Home Management;Cryotherapy;Electrical Stimulation;Iontophoresis 99m/ml Dexamethasone;Moist Heat;Ultrasound;DME Instruction;Gait training;Stair training;Balance training;Therapeutic exercise;Therapeutic activities;Functional mobility training;Neuromuscular re-education;Patient/family education;Manual techniques;Passive range of motion;Energy conservation    PT Next Visit Plan  Continue with program. Ambulate without rollator to challenge balance.     PT Home Exercise Plan  sit to stands, standing hip abduction, standing marches, tandem stance    Consulted and Agree with Plan of Care  Patient       Patient will benefit from skilled therapeutic intervention in order to improve the following deficits and impairments:  Abnormal gait, Decreased activity tolerance, Decreased balance, Decreased coordination, Decreased endurance, Decreased mobility, Decreased range of motion, Decreased safety awareness, Decreased strength, Difficulty walking, Impaired flexibility, Impaired perceived functional ability, Improper body mechanics, Postural dysfunction, Pain, Impaired UE functional use, Decreased knowledge of precautions  Visit Diagnosis: Muscle weakness (generalized)  Unsteadiness on feet     Problem List Patient Active Problem List   Diagnosis Date Noted  . Cognitive deficits   . Labile blood pressure   . Acute blood loss anemia   . Slow transit constipation   . Vascular headache   . 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 [place]cephalus ([place] 07/03/2017  . Communicating [place] ([place] 07/03[place]2017  . Pelvic fracture ([place] 04/18/2016  . CD (celiac disease) 08/04/2014  . Cancer of upper lobe of right lung ([place] 08/04/2014  . Age related osteoporosis 11/26/2013  . Absolute anemia 05/21/2013  . H/O malignant  neoplasm 05/21/2013  . HLD (hyperlipidemia) 05/21/2013    ZOrlean Patten SPT  This entire session was performed under direct supervision and direction of a licensed therapist/therapist assistant . I have personally read, edited and approve of the note as written.  MJanna Arch PT, DPT    02/18/2018, 12:19 PM  [place]SERV[facility] [address]RL[address] [place] [address]Phone: [phone]  Fax:  [phone] Name: MNOELLY LASSEIGNEMRN: [id number] of Birth: [birth year]

## 2018-02-20 ENCOUNTER — Ambulatory Visit: Payer: PPO

## 2018-02-20 DIAGNOSIS — M6281 Muscle weakness (generalized): Secondary | ICD-10-CM | POA: Diagnosis not present

## 2018-02-20 DIAGNOSIS — R2681 Unsteadiness on feet: Secondary | ICD-10-CM

## 2018-02-20 NOTE — Therapy (Addendum)
[place] MAIN [facility] SERVICES [address] [place], [place], [address] Phone: [phone]   Fax:  [phone]  Physical Therapy Treatment  Patient Details  Name: Krista Evans MRN: [id number] Date of Birth: [birth year] Referring Provider (PT): Ramonita Lab   Encounter Date: 02/20/2018  PT End of Session - 02/20/18 1257    Visit Number  31    Number of Visits  44    Date for PT Re-Evaluation  04/08/18    Authorization Type  5/10 starting 1/20    PT Start Time  1017    PT Stop Time  1100    PT Time Calculation (min)  43 min    Equipment Utilized During Treatment  Gait belt    Activity Tolerance  Patient tolerated treatment well    Behavior During Therapy  WFL for tasks assessed/performed       Past Medical History:  Diagnosis Date  . Abnormal Q waves on electrocardiogram   . Anemia   . Aortic atherosclerosis ([place])    "I could possibly have it, my grandmother had it"  . Cancer [facility]) 05/2011   Right upper Lung CA with partial Lobectomy.  . Celiac disease   . Celiac disease   . Dyspnea    with exertion  . Hyperlipidemia   . Hyperlipidemia   . Lung mass   . Meningioma ([place])   . Osteoporosis   . Personal history of chemotherapy   . Personal history of radiation therapy     Past Surgical History:  Procedure Laterality Date  . LUNG LOBECTOMY     right lung  . 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  Subjective Assessment - 02/20/18 1250    Subjective  Patient reports that she is feeling good today. Patient denies stumbles/falls since previous session. Patient reports that she has been consistently performing her exercises at home, but that she is excited for a new challenge/more exercises. She feels that her breathing has been better when  performing exercises.    Pertinent History  Patient stated she received therapy here about a year ago and went home and did not do a lot of exercise. Patient reports around June 2019 she had an MRI which showed enlarged ventricles and hydrocephalus and shunt was placed. Spent about a week and half in the hospital where she also received therapy. Also received home health PT and OT for about 3 weeks when discharged from hospital but has not done her exercises since. States she is not aware of any shunt precautions but feels the shunt which is described as a sharp pain when she lays late and leans forward. Reports her doctor is currently unaware of these symptoms. Patient states she walks around the house daily and 1x week she walks down the street and back with husband. Patient reports L leg aching after she begins walking to bedroom and back with rollator. Reports this has been happening for a couple years. Patient would like to get stronger, improve balance, and become more independent. Currently requires assistance getting rollator in and out of car.      Limitations  Lifting;Standing;Walking;House hold activities    How long can you sit comfortably?  n/a    How long can you stand comfortably?  5 minutes    How long can you  walk comfortably?  limited but patient unsure of duration     Patient Stated Goals  improve balance, strength, be more independent     Currently in Pain?  No/denies       Neuro-reeducation:  In // bars: All standing interventions require CGA due to instability and limited capacity for prolonged stability on LLE. Verbal cueing and encouragement for task progression/ body mechanics required.    -Airex pad: normal stance 60 seconds x1 without UE support (holding onto ball), no LOB --> feet together stance 30 seconds x1 without UE support (holding onto ball), one LOB towards R requiring moderate assistance from SPT to correct; patient requires verbal cues to bring feet even closer  together   -Airex pad: normal stance creating alphabet with rainbow colored ball upper case x1; patient requires seated rest break at letter "R" due to fatigue; patient reports that she feels that her BLE muscles are working to keep her balance   -Airex pad: normal stance, horizontal head turns x10 each side x1 holding onto ball; patient requires verbal reminder to only move head   -Airex pad: normal stance, vertical head turns x10 up/down x1 holding onto ball   -Airex pad: normal stance balloon taps reaching inside and outside BOS x75 seconds; patient indicates it is 'hard' to keep her balance while hitting balloon; patient benefits from verbal cues to use LUE to tap balloon; towards end of exericse      Therapeutic exercise:  Performance of New HEP:   -Mini squats x10 with bilateral UE support; patient requires verbal cues to keep knees aligned and from falling inwards  -Standing hip abduction in parallel bars 1x10 each side with bilateral UE support; patient does not require verbal cues to keep toes pointed forward, but does require intermittent verbal cues to move slowly  -Standing hip extension in parallel bars 1x10 each side with bilateral UE support; patient indicates that it is more difficult on her LLE  -Alternating standing marches x10 each side with bilateral UE support  -Standing hamstring curls/knee flexion against gravity x10 each side with bilateral UE support; patient requires frequent verbal and tactile cues to prevent hip flexion/knees coming forward to isolate hamstrings; patient indicates it is more difficult on her LLE  -Seated plantarflexion against green TB with SPT holding 1x10 each side; patient rarely requires verbal cue to control her ankle back into dorsiflexion (eccentric plantarflexion)   -Seated adduction with green ball between knees x10 with 3 second holds; patient benefits from verbal cues for rhythm/sequencing/counting hold  Updated HEP: Access Code:  [email or web address]  URL: [email or web address]  Date: 02/20/2018  Prepared by: Janna Arch   Exercises  . Mini Squat with Counter Support - 10 reps - 2 sets - 3 hold - 1x daily - 7x weekly  . Standing March with Counter Support - 10 reps - 2 sets - 5 hold - 1x daily - 7x weekly  . Standing Hip Extension with Counter Support - 10 reps - 2 sets - 5 hold - 1x daily - 7x weekly  . Standing Knee Flexion with Counter Support - 10 reps - 2 sets - 3 hold - 1x daily - 7x weekly  . Standing Hip Abduction with Counter Support - 10 reps - 2 sets - 5 hold - 1x daily - 7x weekly                 PT Education - 02/20/18 1256    Education provided  Yes    Education Details  balance, stability, exericse technique, new HEP exercises    Person(s) Educated  Patient    Methods  Explanation;Demonstration;Tactile cues;Verbal cues;Handout    Comprehension  Verbalized understanding;Returned demonstration;Verbal cues required;Tactile cues required;Need further instruction       PT Short Term Goals - 02/11/18 1032      PT SHORT TERM GOAL #1   Title  Patient will be independent in HEP demonstrating successful between session carryover and improve functional mobility and ease of completing ADLs.    Baseline  10/8: HEP given next session 11/11: HEP compliance    Time  2    Period  Weeks    Status  Achieved      PT SHORT TERM GOAL #2   Title  Patient will be able to transfer sit<>Stand from regular height chair without pushing on arm rests to exhibit improved independence improved functional strength.     Baseline  10/8: BUE support 11/11: hands on knees    Time  2    Period  Weeks    Status  Achieved        PT Long Term Goals - 02/11/18 1032      PT LONG TERM GOAL #1   Title  Patient will ambulate >1040f with rollator during 675m demonstrating improved community ambulation and ease with shopping    Baseline  10/8: [id number]/11: [id number]/2: 1030 ft with rollator     Time  8    Period   Weeks    Status  Achieved      PT LONG TERM GOAL #2   Title  Patient (> 58 y57rs old) will complete five times sit to stand test in < 15 seconds indicating an increased LE strength and improved balance.    Baseline  10/8: 26seconds with BUE 11/11: 24 seconds BUE 12/2: 16 seconds with SUE support 02/04/18: 23.8 sec BUE support, 1 LOB inititating test.1/27: 13 seconds SUE support, 1 episode of LOB     Time  8    Period  Weeks    Status  Partially Met    Target Date  04/08/18      PT LONG TERM GOAL #3   Title  Patient will demonstrate an improved Berg Balance Score of >42/56 as to demonstrate improved balance with ADLs such as sitting/standing and transfer balance and reduced fall risk.     Baseline  10/8: 37/56 11/11: 40/56 12/2: 43/56     Time  8    Period  Weeks    Status  Achieved      PT LONG TERM GOAL #4   Title  Patient will improve LEFS to 54/80 to demonstrate improved fucnctional strenth and mobility and ease of indepedence    Baseline  10/8: 45/80 11/11: 41/80 12/2: 45/80 1/27:  48/80    Time  8    Period  Weeks    Status  Partially Met    Target Date  04/08/18      PT LONG TERM GOAL #5   Title  Patient will be able to load and unload rollator indepedently to improve independence and quality of life.     Baseline  10/8: needs assistance to put rollator in and out of car 11/11: requires assistance; 02/04/2018: patient has not attempted 1/27: has not attempted due to illness    Time  8    Period  Weeks    Status  On-going    Target Date  04/08/18      PT LONG  TERM GOAL #6   Title  Patient will demonstrate an improved Berg Balance Score of >49/56 as to demonstrate improved balance with ADLs such as sitting/standing and transfer balance and reduced fall risk.     Baseline  12/2: 43/56; 02/04/18: 33/56 (patient has significant fatigue 2/2 to upper respiratory infection)    Time  8    Period  Weeks    Status  On-going    Target Date  04/08/18      PT LONG TERM GOAL #7    Title  Patient will demonstrate ability to ambulate without AD 200 ft Independently to increase independence in mobility.     Baseline  12/2: requires rollator 02/04/18: requires CGA/SBA, no AD 1/27: requires CGA without AD     Time  8    Period  Weeks    Status  On-going    Target Date  04/08/18            Plan - 02/20/18 1402    Clinical Impression Statement  The patient continues to progress to more advanced balance and strengthening exercises. The patient demonstrated a longer duration of normal stance balance on the Airex pad without a loss of balance, and was able to progress to and demonstrate/tolerate more advanced BLE strengthening exercises that were added to her HEP. However, the patient continues to demonstrate impaired fatigue with prolonged exercise/activity and requires frequent rest breaks, and also continues to require skilled guarding and frequent reassurance and cueing with more advanced balance exercises. The patient will continue to benefit from skilled PT while inpatient in order to progress towards goals and to maximize safety and independence with functional mobility.    Rehab Potential  Good    Clinical Impairments Affecting Rehab Potential  (+) improvement made within last year, eagerness to participate (-) HEP compliance, safety awareness     PT Frequency  2x / week    PT Duration  8 weeks    PT Treatment/Interventions  ADLs/Self Care Home Management;Cryotherapy;Electrical Stimulation;Iontophoresis 72m/ml Dexamethasone;Moist Heat;Ultrasound;DME Instruction;Gait training;Stair training;Balance training;Therapeutic exercise;Therapeutic activities;Functional mobility training;Neuromuscular re-education;Patient/family education;Manual techniques;Passive range of motion;Energy conservation    PT Next Visit Plan  Continue with program. Ambulate without rollator to challenge balance. Feet togther balance on Airex pad.    PT Home Exercise Plan  standing hip abduction, standing  marches, standing hip extension, standing hamstring curls, mini squats    Consulted and Agree with Plan of Care  Patient       Patient will benefit from skilled therapeutic intervention in order to improve the following deficits and impairments:  Abnormal gait, Decreased activity tolerance, Decreased balance, Decreased coordination, Decreased endurance, Decreased mobility, Decreased range of motion, Decreased safety awareness, Decreased strength, Difficulty walking, Impaired flexibility, Impaired perceived functional ability, Improper body mechanics, Postural dysfunction, Pain, Impaired UE functional use, Decreased knowledge of precautions  Visit Diagnosis: Muscle weakness (generalized)  Unsteadiness on feet     Problem List Patient Active Problem List   Diagnosis Date Noted  . Cognitive deficits   . Labile blood pressure   . Acute blood loss anemia   . Slow transit constipation   . Vascular headache   . 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 [place] 07/03/[place]  . 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 Pelvic fracture ([place] 04/18/2016  . CD (celiac disease) 08/04/2014  . [place] of [place] of [place] 08/04/2014  . Age related osteoporosis 11/26/2013  . Absolute anemia 05/21/2013  . H/O malignant neoplasm 05/21/2013  . HLD (  hyperlipidemia) 05/21/2013   Orlean Patten, SPT  This entire session was performed under direct supervision and direction of a licensed therapist/therapist assistant . I have personally read, edited and approve of the note as written.  Janna Arch, PT, DPT    02/20/2018, 5:48 PM  [place] MAIN [facility] SERVICES [address] [place], [place], [address] Phone: [phone]   Fax:  [phone]  Name: ERNIE SAGRERO MRN: [id number] Date of Birth: 01/26/1945

## 2018-02-25 ENCOUNTER — Ambulatory Visit: Payer: PPO

## 2018-03-04 ENCOUNTER — Ambulatory Visit: Payer: PPO

## 2018-03-04 DIAGNOSIS — R2681 Unsteadiness on feet: Secondary | ICD-10-CM

## 2018-03-04 DIAGNOSIS — M6281 Muscle weakness (generalized): Secondary | ICD-10-CM

## 2018-03-04 NOTE — Therapy (Addendum)
[place] [facility] SERVICES [address] [place], [place], [address] Phone: [phone]   Fax:  [phone]  Physical Therapy Treatment  Patient Details  Name: Krista Evans MRN: [id number] Date of Birth: 03/28/1945 Referring Provider (PT): Ramonita Lab   Encounter Date: 03/04/2018  PT End of Session - 03/04/18 1121    Visit Number  32    Number of Visits  44    Date for PT Re-Evaluation  04/08/18    Authorization Type  6/10 starting 1/20    PT Start Time  1025    PT Stop Time  1112    PT Time Calculation (min)  47 min    Equipment Utilized During Treatment  Gait belt    Activity Tolerance  Patient tolerated treatment well    Behavior During Therapy  WFL for tasks assessed/performed       Past Medical History:  Diagnosis Date  . Abnormal Q waves on electrocardiogram   . Anemia   . Aortic atherosclerosis ([place])    "I could possibly have it, my grandmother had it"  . Cancer [facility]) 05/2011   Right upper Lung CA with partial Lobectomy.  . Celiac disease   . Celiac disease   . Dyspnea    with exertion  . Hyperlipidemia   . Hyperlipidemia   . Lung mass   . Meningioma ([place])   . Osteoporosis   . Personal history of chemotherapy   . Personal history of radiation therapy     Past Surgical History:  Procedure Laterality Date  . LUNG LOBECTOMY     right lung  . 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  Subjective Assessment - 03/04/18 1118    Subjective  The patient reports that she is doing well today and that she had a good weekend. Patient denies stumbles/falls since previous session She notes that she has been performing her new HEP daily with the exception of yesterday. She notes that her new HEP is a good challenge that makes her a  little sore, but the soreness does not prevent her from moving about and does not influence her balance.    Pertinent History  Patient stated she received therapy here about a year ago and went home and did not do a lot of exercise. Patient reports around June 2019 she had an MRI which showed enlarged ventricles and hydrocephalus and shunt was placed. Spent about a week and half in the hospital where she also received therapy. Also received home health PT and OT for about 3 weeks when discharged from hospital but has not done her exercises since. States she is not aware of any shunt precautions but feels the shunt which is described as a sharp pain when she lays late and leans forward. Reports her doctor is currently unaware of these symptoms. Patient states she walks around the house daily and 1x week she walks down the street and back with husband. Patient reports L leg aching after she begins walking to bedroom and back with rollator. Reports this has been happening for a couple years. Patient would like to get stronger, improve balance, and become more independent. Currently requires assistance getting rollator in and out of car.      Limitations  Lifting;Standing;Walking;House hold activities    How long can you  sit comfortably?  n/a    How long can you stand comfortably?  5 minutes    How long can you walk comfortably?  limited but patient unsure of duration     Patient Stated Goals  improve balance, strength, be more independent     Currently in Pain?  No/denies           Neuro-reeducation:  In // bars: All standing interventions require CGA due to instability and limited capacity for prolonged stability on LLE. Verbal cueing and encouragement for task progression/ body mechanics required.   -Airex pad: normal stance without UE support (holding ball) 1x30 seconds --> feet together stance 2x30 seconds without UE support (holding onto ball); patient indicates that feet together stance is medium  difficulty   -Airex pad: normal stance creating alphabet with rainbow colored ball lower case x1; patient does not require seated rest break due to fatigue   -Airex pad: SPT and patient play I spy for 2 min 30 seconds x1 with feet between normal stance and feet together; patient indicates that her legs felt tired towards end of bout     Therapeutic exercise:   Review and Performance of HEP              -Mini squats x10 with bilateral UE support; patient benefits from verbal cues to keep knees aligned and from falling inwards             -Standing hip abduction in parallel bars 1x10 each side with bilateral UE support; patient does not require verbal cues to keep toes pointed forward, but does require intermittent verbal cues to move more slowly             -Standing hip extension in parallel bars 1x10 each side with bilateral UE support; patient indicates that it is more difficult on her LLE             -Alternating standing marches x10 each side with bilateral UE support for balance; patient moves slowly and achieves 90 degrees of hip and knee flexion with marches bilaterally throughout             -Standing hamstring curls/knee flexion against gravity x10 each side with bilateral UE support; patient requires frequent verbal and tactile cues to prevent hip flexion/knees coming forward to isolate hamstrings; patient indicates it is more difficult on her LLE   -Forward lunges 1x10 BLE with BUE support for balance; patient benefits from verbal cues to take longer forward step and to perform deeper lunge  Leg press machine: Two legged 1x10 and 1x7 (patient indicates BLE fatigue and states that she cannot perform any more) at 120 pounds; patient requires rare verbal cue to keep knees aligned and from falling inwards   -Seated adduction with green ball between knees 1x10 with 3 second holds; patient benefits from verbal cues for rhythm/sequencing/counting hold  -Ambulation without assistive device  in gym over even ground, Contact Guard Assist for safety 1x25 feet   PT Education - 03/04/18 1120    Education provided  Yes    Education Details  balance, stability, exercise technique, muscle soreness, patient progress, advancement of exercises    Person(s) Educated  Patient    Methods  Explanation;Demonstration;Tactile cues;Verbal cues    Comprehension  Verbalized understanding;Returned demonstration;Verbal cues required;Tactile cues required;Need further instruction       PT Short Term Goals - 02/11/18 1032      PT SHORT TERM GOAL #1   Title  Patient will  be independent in HEP demonstrating successful between session carryover and improve functional mobility and ease of completing ADLs.    Baseline  10/8: HEP given next session 11/11: HEP compliance    Time  2    Period  Weeks    Status  Achieved      PT SHORT TERM GOAL #2   Title  Patient will be able to transfer sit<>Stand from regular height chair without pushing on arm rests to exhibit improved independence improved functional strength.     Baseline  10/8: BUE support 11/11: hands on knees    Time  2    Period  Weeks    Status  Achieved        PT Long Term Goals - 02/11/18 1032      PT LONG TERM GOAL #1   Title  Patient will ambulate >1043f with rollator during 628m demonstrating improved community ambulation and ease with shopping    Baseline  10/8: [id number]/11: [id number]/2: 1030 ft with rollator     Time  8    Period  Weeks    Status  Achieved      PT LONG TERM GOAL #2   Title  Patient (> 58 y53rs old) will complete five times sit to stand test in < 15 seconds indicating an increased LE strength and improved balance.    Baseline  10/8: 26seconds with BUE 11/11: 24 seconds BUE 12/2: 16 seconds with SUE support 02/04/18: 23.8 sec BUE support, 1 LOB inititating test.1/27: 13 seconds SUE support, 1 episode of LOB     Time  8    Period  Weeks    Status  Partially Met    Target Date  04/08/18      PT LONG TERM GOAL  #3   Title  Patient will demonstrate an improved Berg Balance Score of >42/56 as to demonstrate improved balance with ADLs such as sitting/standing and transfer balance and reduced fall risk.     Baseline  10/8: 37/56 11/11: 40/56 12/2: 43/56     Time  8    Period  Weeks    Status  Achieved      PT LONG TERM GOAL #4   Title  Patient will improve LEFS to 54/80 to demonstrate improved fucnctional strenth and mobility and ease of indepedence    Baseline  10/8: 45/80 11/11: 41/80 12/2: 45/80 1/27:  48/80    Time  8    Period  Weeks    Status  Partially Met    Target Date  04/08/18      PT LONG TERM GOAL #5   Title  Patient will be able to load and unload rollator indepedently to improve independence and quality of life.     Baseline  10/8: needs assistance to put rollator in and out of car 11/11: requires assistance; 02/04/2018: patient has not attempted 1/27: has not attempted due to illness    Time  8    Period  Weeks    Status  On-going    Target Date  04/08/18      PT LONG TERM GOAL #6   Title  Patient will demonstrate an improved Berg Balance Score of >49/56 as to demonstrate improved balance with ADLs such as sitting/standing and transfer balance and reduced fall risk.     Baseline  12/2: 43/56; 02/04/18: 33/56 (patient has significant fatigue 2/2 to upper respiratory infection)    Time  8    Period  Weeks  Status  On-going    Target Date  04/08/18      PT LONG TERM GOAL #7   Title  Patient will demonstrate ability to ambulate without AD 200 ft Independently to increase independence in mobility.     Baseline  12/2: requires rollator 02/04/18: requires CGA/SBA, no AD 1/27: requires CGA without AD     Time  8    Period  Weeks    Status  On-going    Target Date  04/08/18            Plan - 03/04/18 1138    Clinical Impression Statement  The patient remains motivated during PT sessions and is compliant with HEP/exercises. The patient performed standing balance on an uneven  surface for a longer duration of time today. The patient is also tolerating more BLE strengthening in terms of volume (number of exercises, repetitions, etc.) as compared to previous sessions, although she was unable to finish her second set of 10 on the leg press machine at the end of today's session due to BLE fatigue. The patient will continue to benefit from skilled PT in order to progress towards goals and to maximize safety and independence with functional mobility.    Rehab Potential  Good    Clinical Impairments Affecting Rehab Potential  (+) improvement made within last year, eagerness to participate (-) HEP compliance, safety awareness     PT Frequency  2x / week    PT Duration  8 weeks    PT Treatment/Interventions  ADLs/Self Care Home Management;Cryotherapy;Electrical Stimulation;Iontophoresis 68m/ml Dexamethasone;Moist Heat;Ultrasound;DME Instruction;Gait training;Stair training;Balance training;Therapeutic exercise;Therapeutic activities;Functional mobility training;Neuromuscular re-education;Patient/family education;Manual techniques;Passive range of motion;Energy conservation    PT Next Visit Plan  Continue with program. Ambulate without rollator to challenge balance. Sit to stands for strengthening.    PT Home Exercise Plan  standing hip abduction, standing marches, standing hip extension, standing hamstring curls, mini squats    Consulted and Agree with Plan of Care  Patient       Patient will benefit from skilled therapeutic intervention in order to improve the following deficits and impairments:  Abnormal gait, Decreased activity tolerance, Decreased balance, Decreased coordination, Decreased endurance, Decreased mobility, Decreased range of motion, Decreased safety awareness, Decreased strength, Difficulty walking, Impaired flexibility, Impaired perceived functional ability, Improper body mechanics, Postural dysfunction, Pain, Impaired UE functional use, Decreased knowledge of  precautions  Visit Diagnosis: Muscle weakness (generalized)  Unsteadiness on feet     Problem List Patient Active Problem List   Diagnosis Date Noted  . Cognitive deficits   . Labile blood pressure   . Acute blood loss anemia   . Slow transit constipation   . Vascular headache   . 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 Hydrocephalus ([place] 07/03/2017  . Communicating [place]cephalus ([place] 07/03/2017  . Pelvic fracture ([place] 04/18/2016  . CD (celiac disease) 08/04/2014  . [place] of [place] of right [place] ([place] 08/04/2014  . Age related osteoporosis 11/26/2013  . Absolute anemia 05/21/2013  . H/O malignant neoplasm 05/21/2013  . HLD (hyperlipidemia) 05/21/2013   ZOrlean Patten SPT  03/04/2018, 11:44 AM   DLieutenant DiegoPT, DPT 12:16 PM,03/04/18 [phone] This entire session was performed under direct supervision and direction of a licensed therapist/therapist assistant . I have personally read, edited and approve of the note as written.   [facility] [address] [address]Phone: [phone]  Fax:  [phone] Name: Krista Evans MRN: [id number] Date of Birth: 14-Oct-1945

## 2018-03-06 ENCOUNTER — Ambulatory Visit: Payer: PPO

## 2018-03-06 DIAGNOSIS — M6281 Muscle weakness (generalized): Secondary | ICD-10-CM | POA: Diagnosis not present

## 2018-03-06 DIAGNOSIS — R2681 Unsteadiness on feet: Secondary | ICD-10-CM

## 2018-03-06 DIAGNOSIS — R269 Unspecified abnormalities of gait and mobility: Secondary | ICD-10-CM

## 2018-03-06 NOTE — Therapy (Addendum)
[place] MAIN [facility] SERVICES [address] [place], [place], [address] Phone: [phone]   Fax:  [phone]  Physical Therapy Treatment  Patient Details  Name: Krista Evans MRN: [id number] Date of Birth: October 31, 1945 Referring Provider (PT): Ramonita Lab   Encounter Date: 03/06/2018  PT End of Session - 03/06/18 1155    Visit Number  33    Number of Visits  44    Date for PT Re-Evaluation  04/08/18    Authorization Type  7/10 starting 1/20    PT Start Time  1032    PT Stop Time  1114    PT Time Calculation (min)  42 min    Equipment Utilized During Treatment  Gait belt    Activity Tolerance  Patient tolerated treatment well    Behavior During Therapy  WFL for tasks assessed/performed       Past Medical History:  Diagnosis Date  . Abnormal Q waves on electrocardiogram   . Anemia   . Aortic atherosclerosis ([place])    "I could possibly have it, my grandmother had it"  . Cancer [facility]) 05/2011   Right upper Lung CA with partial Lobectomy.  . Celiac disease   . Celiac disease   . Dyspnea    with exertion  . Hyperlipidemia   . Hyperlipidemia   . Lung mass   . Meningioma ([place])   . Osteoporosis   . Personal history of chemotherapy   . Personal history of radiation therapy     Past Surgical History:  Procedure Laterality Date  . LUNG LOBECTOMY     right lung  . 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  Subjective Assessment - 03/06/18 1151    Subjective  The patient reports that she is doing well today and is eager to exercise today. She notes that she has not been able to exercise/get out as much due to the recent span of colder weather. She denies stumbles/falls since last visit. She has been consistent with her HEP.    Pertinent History   Patient stated she received therapy here about a year ago and went home and did not do a lot of exercise. Patient reports around June 2019 she had an MRI which showed enlarged ventricles and hydrocephalus and shunt was placed. Spent about a week and half in the hospital where she also received therapy. Also received home health PT and OT for about 3 weeks when discharged from hospital but has not done her exercises since. States she is not aware of any shunt precautions but feels the shunt which is described as a sharp pain when she lays late and leans forward. Reports her doctor is currently unaware of these symptoms. Patient states she walks around the house daily and 1x week she walks down the street and back with husband. Patient reports L leg aching after she begins walking to bedroom and back with rollator. Reports this has been happening for a couple years. Patient would like to get stronger, improve balance, and become more independent. Currently requires assistance getting rollator in and out of car.      Limitations  Lifting;Standing;Walking;House hold activities    How long can you sit comfortably?  n/a    How long can you stand comfortably?  5 minutes    How long  can you walk comfortably?  limited but patient unsure of duration     Patient Stated Goals  improve balance, strength, be more independent     Currently in Pain?  No/denies         Neuro Re-Ed  In // bars: All standing interventions require CGA due to instability and limited capacity for prolonged stability on LLE. Verbal cueing and encouragement for task progression/ body mechanics required.    -Airex pad: normal stance without UE support (holding ball) 1x30 seconds --> feet together stance 2x45 seconds without UE support (holding onto ball); patient indicates that feet together stance is medium difficulty  -Airex pad: narrow stance (between normal and feet together), horizontal head turns x10 each side x1 holding onto ball    -Airex pad: narrow stance (between normal and feet together), vertical head turns x10 up/down x1 holding onto ball  -Airex pad: normal stancecreating alphabet with rainbow colored ball upper case x1; following performance, patient indicates that her glutes feel 'sore' and that she is fatigued   TherEx:  -Prolonged Ambulation without AD in hallway with verbal cues for improving forward momentum 1200 ft with gait belt donned and CGA; patient requires frequent verbal cues and tactile cues for L arm swing; Patient cued for utilization of LUE to pushing button for doors to open; occasional stuttering/shuffling when attempting to open door, move through doorway, and during navigation of narrow spaces in gift shop; patient initially anxious and more apprehensive when ambulating through tighter spaces/aisles of gift shop; patient requires 2 short seated rest breaks during bout due to fatigue   -Seated hamstring stretch 1x60 BLE; patient requires verbal cue to lean forward for increased stretch; decreased flexibility of RLE  -Seated hamstring stretch with patient performing dorsiflexion/plantarflexion for nerve floss 1x60 second each LE; patient indicates that she feels it more in her RLE, and that it feels better with increasing repetitions                     PT Education - 03/06/18 1153    Education provided  Yes    Education Details  balance, stability, exercise technique, stretching, gait mechanics (arm swing)    Person(s) Educated  Patient    Methods  Explanation;Demonstration;Tactile cues;Verbal cues    Comprehension  Verbalized understanding;Returned demonstration;Verbal cues required;Tactile cues required;Need further instruction       PT Short Term Goals - 02/11/18 1032      PT SHORT TERM GOAL #1   Title  Patient will be independent in HEP demonstrating successful between session carryover and improve functional mobility and ease of completing ADLs.    Baseline  10/8:  HEP given next session 11/11: HEP compliance    Time  2    Period  Weeks    Status  Achieved      PT SHORT TERM GOAL #2   Title  Patient will be able to transfer sit<>Stand from regular height chair without pushing on arm rests to exhibit improved independence improved functional strength.     Baseline  10/8: BUE support 11/11: hands on knees    Time  2    Period  Weeks    Status  Achieved        PT Long Term Goals - 02/11/18 1032      PT LONG TERM GOAL #1   Title  Patient will ambulate >865f with rollator during 614m demonstrating improved community ambulation and ease with shopping    Baseline  10/8: 8458f  11/11: 83f 12/2: 1030 ft with rollator     Time  8    Period  Weeks    Status  Achieved      PT LONG TERM GOAL #2   Title  Patient (> 89years old) will complete five times sit to stand test in < 15 seconds indicating an increased LE strength and improved balance.    Baseline  10/8: 26seconds with BUE 11/11: 24 seconds BUE 12/2: 16 seconds with SUE support 02/04/18: 23.8 sec BUE support, 1 LOB inititating test.1/27: 13 seconds SUE support, 1 episode of LOB     Time  8    Period  Weeks    Status  Partially Met    Target Date  04/08/18      PT LONG TERM GOAL #3   Title  Patient will demonstrate an improved Berg Balance Score of >42/56 as to demonstrate improved balance with ADLs such as sitting/standing and transfer balance and reduced fall risk.     Baseline  10/8: 37/56 11/11: 40/56 12/2: 43/56     Time  8    Period  Weeks    Status  Achieved      PT LONG TERM GOAL #4   Title  Patient will improve LEFS to 54/80 to demonstrate improved fucnctional strenth and mobility and ease of indepedence    Baseline  10/8: 45/80 11/11: 41/80 12/2: 45/80 1/27:  48/80    Time  8    Period  Weeks    Status  Partially Met    Target Date  04/08/18      PT LONG TERM GOAL #5   Title  Patient will be able to load and unload rollator indepedently to improve independence and quality of  life.     Baseline  10/8: needs assistance to put rollator in and out of car 11/11: requires assistance; 02/04/2018: patient has not attempted 1/27: has not attempted due to illness    Time  8    Period  Weeks    Status  On-going    Target Date  04/08/18      PT LONG TERM GOAL #6   Title  Patient will demonstrate an improved Berg Balance Score of >49/56 as to demonstrate improved balance with ADLs such as sitting/standing and transfer balance and reduced fall risk.     Baseline  12/2: 43/56; 02/04/18: 33/56 (patient has significant fatigue 2/2 to upper respiratory infection)    Time  8    Period  Weeks    Status  On-going    Target Date  04/08/18      PT LONG TERM GOAL #7   Title  Patient will demonstrate ability to ambulate without AD 200 ft Independently to increase independence in mobility.     Baseline  12/2: requires rollator 02/04/18: requires CGA/SBA, no AD 1/27: requires CGA without AD     Time  8    Period  Weeks    Status  On-going    Target Date  04/08/18            Plan - 03/06/18 1214    Clinical Impression Statement  The patient continues to progress with balance and strengthening exercises. She tolerated increased duration and difficulty of exercises on the Airex pad today with decreased fatigue and fewer seated rest breaks. During prolonged ambulation around the hospital, the patient demonstrated improved gait speed and step length and required less frequent verbal cueing for increased L arm swing as  compared to previous sessions. However, the patient continues to demonstrate impaired static and dynamic balance and BLE strength, and will ultimately continue to benefit from skilled PT in order to work towards goals and to maximize safety and independence with functional mobility.    Rehab Potential  Good    Clinical Impairments Affecting Rehab Potential  (+) improvement made within last year, eagerness to participate (-) HEP compliance, safety awareness     PT Frequency   2x / week    PT Duration  8 weeks    PT Treatment/Interventions  ADLs/Self Care Home Management;Cryotherapy;Electrical Stimulation;Iontophoresis 87m/ml Dexamethasone;Moist Heat;Ultrasound;DME Instruction;Gait training;Stair training;Balance training;Therapeutic exercise;Therapeutic activities;Functional mobility training;Neuromuscular re-education;Patient/family education;Manual techniques;Passive range of motion;Energy conservation    PT Next Visit Plan  Continue with program. Ambulate without rollator to challenge balance. Sit to stands for strengthening. Progress static and dynamic balance.    PT Home Exercise Plan  standing hip abduction, standing marches, standing hip extension, standing hamstring curls, mini squats    Consulted and Agree with Plan of Care  Patient       Patient will benefit from skilled therapeutic intervention in order to improve the following deficits and impairments:  Abnormal gait, Decreased activity tolerance, Decreased balance, Decreased coordination, Decreased endurance, Decreased mobility, Decreased range of motion, Decreased safety awareness, Decreased strength, Difficulty walking, Impaired flexibility, Impaired perceived functional ability, Improper body mechanics, Postural dysfunction, Pain, Impaired UE functional use, Decreased knowledge of precautions  Visit Diagnosis: Muscle weakness (generalized)  Unsteadiness on feet  Neurologic gait disorder     Problem List Patient Active Problem List   Diagnosis Date Noted  . Cognitive deficits   . Labile blood pressure   . Acute blood loss anemia   . Slow transit constipation   . Vascular headache   . 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 [place]us ([place] 07/03[place]  . 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 Pelvic fracture ([place] 04/18/2016  . CD (celiac disease) 08/04/2014  . Cancer of upper lobe of right lung ([place] 08/04/2014  . Age related osteoporosis 11/26/2013  . Absolute anemia  05/21/2013  . H/O malignant neoplasm 05/21/2013  . HLD (hyperlipidemia) 05/21/2013   ZOrlean Patten SPT  This entire session was performed under direct supervision and direction of a licensed therapist/therapist assistant . I have personally read, edited and approve of the note as written.  MJanna Arch PT, DPT   03/06/2018, 1:40 PM  [place] [address] [address]Phone: [phone]  Fax:  [phone] Name: MDREYAH MONTROSEMRN: [id number] of Birth: [birth year]

## 2018-03-11 ENCOUNTER — Ambulatory Visit: Payer: PPO

## 2018-03-11 DIAGNOSIS — R269 Unspecified abnormalities of gait and mobility: Secondary | ICD-10-CM

## 2018-03-11 DIAGNOSIS — M6281 Muscle weakness (generalized): Secondary | ICD-10-CM

## 2018-03-11 DIAGNOSIS — R2681 Unsteadiness on feet: Secondary | ICD-10-CM

## 2018-03-11 NOTE — Therapy (Addendum)
[place] MAIN [facility] SERVICES [address] [place], [place], [address] Phone: [phone]   Fax:  [phone]  Physical Therapy Treatment  Patient Details  Name: EMANUELLA NICKLE MRN: [id number] Date of Birth: 10-12-1945 Referring Provider (PT): Ramonita Lab   Encounter Date: 03/11/2018  PT End of Session - 03/11/18 1206    Visit Number  34    Number of Visits  44    Date for PT Re-Evaluation  04/08/18    Authorization Type  8/10 starting 1/20    PT Start Time  1031    PT Stop Time  1114    PT Time Calculation (min)  43 min    Equipment Utilized During Treatment  Gait belt    Activity Tolerance  Patient tolerated treatment well    Behavior During Therapy  WFL for tasks assessed/performed       Past Medical History:  Diagnosis Date  . Abnormal Q waves on electrocardiogram   . Anemia   . Aortic atherosclerosis ([place])    "I could possibly have it, my grandmother had it"  . Cancer [facility]) 05/2011   Right upper Lung CA with partial Lobectomy.  . Celiac disease   . Celiac disease   . Dyspnea    with exertion  . Hyperlipidemia   . Hyperlipidemia   . Lung mass   . Meningioma ([place])   . Osteoporosis   . Personal history of chemotherapy   . Personal history of radiation therapy     Past Surgical History:  Procedure Laterality Date  . LUNG LOBECTOMY     right lung  . 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  Subjective Assessment - 03/11/18 1204    Subjective  The patient reports that she is doing well today that she had a good weekend. She denies stumbles/falls since last visit. She has been consistently performing her HEP. She feels that overall her balance is much better, but that she still has a strong desire to continue to improve.    Pertinent History  Patient stated she received therapy here about a year ago and went home and did not do a lot of exercise. Patient reports around June 2019 she had an MRI which showed enlarged ventricles and hydrocephalus and shunt was placed. Spent about a week and half in the hospital where she also received therapy. Also received home health PT and OT for about 3 weeks when discharged from hospital but has not done her exercises since. States she is not aware of any shunt precautions but feels the shunt which is described as a sharp pain when she lays late and leans forward. Reports her doctor is currently unaware of these symptoms. Patient states she walks around the house daily and 1x week she walks down the street and back with husband. Patient reports L leg aching after she begins walking to bedroom and back with rollator. Reports this has been happening for a couple years. Patient would like to get stronger, improve balance, and become more independent. Currently requires assistance getting rollator in and out of car.      Limitations  Lifting;Standing;Walking;House hold activities    How long can you sit comfortably?  n/a    How long can you stand comfortably?  5 minutes    How long can  you walk comfortably?  limited but patient unsure of duration     Patient Stated Goals  improve balance, strength, be more independent     Currently in Pain?  No/denies       Neuro Re-Ed  In // bars: All standing interventions require CGA due to instability and limited capacity for prolonged stability on LLE. Verbal cueing and encouragement for task progression/ body mechanics required.    -Airex pad: normal stance without UE support (holding ball) 1x30 seconds --> feet together stance 2x60 seconds without UE support (holding onto ball)   -Airex pad: narrow stance (between feet together and normal stance) creating alphabet with rainbow colored ball lower case x1  -Foot taps for balance onto mini bosu balls  about 12 inches in front of body (one midline, one in front of R foot normal stance, one in front of L foot normal stance); patient taps color of bosu ball of SPT choice and performs x20 on BLE; patient initially performs with unilateral UE support and progresses to 2 finger support for balance  Ambulate with no AD and CGA and stop and perform red light, green light game (static stand upon SPT command during walking) with CGA 2x75 feet; patient demonstrates one minor loss of balance that she is able to self correct using step strategy   Ambulate without AD holding ball moving ball side to side in horizontal plane 2x75 feet with CGA; patient notes that activity is a medium difficulty and that she enjoys it  Ambulate without AD holding ball moving ball up and down in vertical plane 2x75 feet with CGA; patient notes that it is slightly more difficult than moving ball in horizontal plane but it is a good challenge   TherEx:   Ambulation without AD in hallway 2x75 feet withverbal cues for improving forward momentumand to promote L arm swing   Leg press machine: Two legged 2x10 at 120 pounds; patient requires intermittent verbal cue to keep knees aligned and from falling inwards   -Seated adduction with green ball between knees 1x10 with 3 second holds; patient benefits from verbal cues for rhythm/sequencing/counting hold                          PT Education - 03/11/18 1206    Education provided  Yes    Education Details  balance, stability, exercise technique, posture, gait mechanics (arm swing)    Person(s) Educated  Patient    Methods  Explanation;Demonstration;Tactile cues;Verbal cues    Comprehension  Verbalized understanding;Returned demonstration;Verbal cues required;Tactile cues required;Need further instruction       PT Short Term Goals - 02/11/18 1032      PT SHORT TERM GOAL #1   Title  Patient will be independent in HEP demonstrating successful between  session carryover and improve functional mobility and ease of completing ADLs.    Baseline  10/8: HEP given next session 11/11: HEP compliance    Time  2    Period  Weeks    Status  Achieved      PT SHORT TERM GOAL #2   Title  Patient will be able to transfer sit<>Stand from regular height chair without pushing on arm rests to exhibit improved independence improved functional strength.     Baseline  10/8: BUE support 11/11: hands on knees    Time  2    Period  Weeks    Status  Achieved  PT Long Term Goals - 02/11/18 1032      PT LONG TERM GOAL #1   Title  Patient will ambulate >1030f with rollator during 645m demonstrating improved community ambulation and ease with shopping    Baseline  10/8: 84522f1/11: 890f30f/2: 1030 ft with rollator     Time  8    Period  Weeks    Status  Achieved      PT LONG TERM GOAL #2   Title  Patient (> 60 y17rs old) will complete five times sit to stand test in < 15 seconds indicating an increased LE strength and improved balance.    Baseline  10/8: 26seconds with BUE 11/11: 24 seconds BUE 12/2: 16 seconds with SUE support 02/04/18: 23.8 sec BUE support, 1 LOB inititating test.1/27: 13 seconds SUE support, 1 episode of LOB     Time  8    Period  Weeks    Status  Partially Met    Target Date  04/08/18      PT LONG TERM GOAL #3   Title  Patient will demonstrate an improved Berg Balance Score of >42/56 as to demonstrate improved balance with ADLs such as sitting/standing and transfer balance and reduced fall risk.     Baseline  10/8: 37/56 11/11: 40/56 12/2: 43/56     Time  8    Period  Weeks    Status  Achieved      PT LONG TERM GOAL #4   Title  Patient will improve LEFS to 54/80 to demonstrate improved fucnctional strenth and mobility and ease of indepedence    Baseline  10/8: 45/80 11/11: 41/80 12/2: 45/80 1/27:  48/80    Time  8    Period  Weeks    Status  Partially Met    Target Date  04/08/18      PT LONG TERM GOAL #5   Title   Patient will be able to load and unload rollator indepedently to improve independence and quality of life.     Baseline  10/8: needs assistance to put rollator in and out of car 11/11: requires assistance; 02/04/2018: patient has not attempted 1/27: has not attempted due to illness    Time  8    Period  Weeks    Status  On-going    Target Date  04/08/18      PT LONG TERM GOAL #6   Title  Patient will demonstrate an improved Berg Balance Score of >49/56 as to demonstrate improved balance with ADLs such as sitting/standing and transfer balance and reduced fall risk.     Baseline  12/2: 43/56; 02/04/18: 33/56 (patient has significant fatigue 2/2 to upper respiratory infection)    Time  8    Period  Weeks    Status  On-going    Target Date  04/08/18      PT LONG TERM GOAL #7   Title  Patient will demonstrate ability to ambulate without AD 200 ft Independently to increase independence in mobility.     Baseline  12/2: requires rollator 02/04/18: requires CGA/SBA, no AD 1/27: requires CGA without AD     Time  8    Period  Weeks    Status  On-going    Target Date  04/08/18            Plan - 03/11/18 1224    Clinical Impression Statement  The patient continues to be motivated during PT sessions and appreciates activities that are  more challenging to her. She also continues to progress with both static and dynamic balance exercises in terms of difficulty and duration. However, the patient initially appeared uncertain and less confident during performance of new dynamic balance task resulting in a break down of her gait pattern including increased base of support, decreased L arm swing, and decreased gait speed/step length. However, following practice with the activity, the patient's gait mechanics began to normalize. The patient benefits from frequent verbal reassurance and encouragement. The patient will continue to benefit from skilled PT in order to work towards goals and to maximize safety and  independence with functional mobility.    Rehab Potential  Good    Clinical Impairments Affecting Rehab Potential  (+) improvement made within last year, eagerness to participate (-) HEP compliance, safety awareness     PT Frequency  2x / week    PT Duration  8 weeks    PT Treatment/Interventions  ADLs/Self Care Home Management;Cryotherapy;Electrical Stimulation;Iontophoresis 65m/ml Dexamethasone;Moist Heat;Ultrasound;DME Instruction;Gait training;Stair training;Balance training;Therapeutic exercise;Therapeutic activities;Functional mobility training;Neuromuscular re-education;Patient/family education;Manual techniques;Passive range of motion;Energy conservation    PT Next Visit Plan  Continue with program. Ambulate without rollator to challenge balance. Sit to stands for strengthening. Progress static and dynamic balance. Review/revise HEP.    PT Home Exercise Plan  standing hip abduction, standing marches, standing hip extension, standing hamstring curls, mini squats    Consulted and Agree with Plan of Care  Patient       Patient will benefit from skilled therapeutic intervention in order to improve the following deficits and impairments:  Abnormal gait, Decreased activity tolerance, Decreased balance, Decreased coordination, Decreased endurance, Decreased mobility, Decreased range of motion, Decreased safety awareness, Decreased strength, Difficulty walking, Impaired flexibility, Impaired perceived functional ability, Improper body mechanics, Postural dysfunction, Pain, Impaired UE functional use, Decreased knowledge of precautions  Visit Diagnosis: Muscle weakness (generalized)  Unsteadiness on feet  Neurologic gait disorder     Problem List Patient Active Problem List   Diagnosis Date Noted  . Cognitive deficits   . Labile blood pressure   . Acute blood loss anemia   . Slow transit constipation   . Vascular headache   . 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 [place] ([place]  07/03/2017  . 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 Pelvic fracture ([place] 04/18/2016  . CD (celiac disease) 08/04/2014  . Cancer of upper lobe of right lung ([place] 08/04/2014  . Age related osteoporosis 11/26/2013  . Absolute anemia 05/21/2013  . H/O malignant neoplasm 05/21/2013  . HLD (hyperlipidemia) 05/21/2013   ZOrlean Patten SPT This entire session was performed under direct supervision and direction of a licensed therapist/therapist assistant . I have personally read, edited and approve of the note as written.  MJanna Arch PT, DPT   03/11/2018, 12:54 PM  [place] [facility] [address] [address]Phone: [phone]  Fax:  [phone] Name: MTAYLINN BRABANTMRN: [id number] of Birth: 3Apr 25, 1947

## 2018-03-14 ENCOUNTER — Ambulatory Visit: Payer: PPO

## 2018-03-18 ENCOUNTER — Ambulatory Visit: Payer: PPO | Attending: Internal Medicine

## 2018-03-18 DIAGNOSIS — R2681 Unsteadiness on feet: Secondary | ICD-10-CM | POA: Insufficient documentation

## 2018-03-18 DIAGNOSIS — M6281 Muscle weakness (generalized): Secondary | ICD-10-CM | POA: Insufficient documentation

## 2018-03-18 DIAGNOSIS — R269 Unspecified abnormalities of gait and mobility: Secondary | ICD-10-CM | POA: Insufficient documentation

## 2018-03-18 NOTE — Therapy (Signed)
[name] [facility] SERVICES [address] [place], [place], [address] Phone: [phone]   Fax:  [phone]  Physical Therapy Treatment  Patient Details  Name: Krista Evans MRN: [id number] Date of Birth: 1945-02-28 Referring Provider (PT): Ramonita Lab   Encounter Date: 03/18/2018  PT End of Session - 03/18/18 1242    Visit Number  35    Number of Visits  44    Date for PT Re-Evaluation  04/08/18    Authorization Type  9/10 starting 1/20    PT Start Time  1105    PT Stop Time  1151    PT Time Calculation (min)  46 min    Equipment Utilized During Treatment  Gait belt    Activity Tolerance  Patient tolerated treatment well    Behavior During Therapy  WFL for tasks assessed/performed       Past Medical History:  Diagnosis Date  . Abnormal Q waves on electrocardiogram   . Anemia   . Aortic atherosclerosis ([place])    "I could possibly have it, my grandmother had it"  . Cancer [facility]) 05/2011   Right upper Lung CA with partial Lobectomy.  . Celiac disease   . Celiac disease   . Dyspnea    with exertion  . Hyperlipidemia   . Hyperlipidemia   . Lung mass   . Meningioma ([place])   . Osteoporosis   . Personal history of chemotherapy   . Personal history of radiation therapy     Past Surgical History:  Procedure Laterality Date  . LUNG LOBECTOMY     right lung  . 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  Subjective Assessment - 03/18/18 1124    Subjective  Patient reports no falls or LOB since last session. Has been doing her HEP daily one time a day    Pertinent History  Patient stated she received therapy here about a year ago and went home and did not do a lot of exercise. Patient reports around June 2019 she had an MRI which showed  enlarged ventricles and hydrocephalus and shunt was placed. Spent about a week and half in the hospital where she also received therapy. Also received home health PT and OT for about 3 weeks when discharged from hospital but has not done her exercises since. States she is not aware of any shunt precautions but feels the shunt which is described as a sharp pain when she lays late and leans forward. Reports her doctor is currently unaware of these symptoms. Patient states she walks around the house daily and 1x week she walks down the street and back with husband. Patient reports L leg aching after she begins walking to bedroom and back with rollator. Reports this has been happening for a couple years. Patient would like to get stronger, improve balance, and become more independent. Currently requires assistance getting rollator in and out of car.      Limitations  Lifting;Standing;Walking;House hold activities    How long can you sit comfortably?  n/a    How long can you stand comfortably?  5 minutes    How long can you walk comfortably?  limited but patient unsure of duration     Patient Stated Goals  improve balance, strength, be more independent     Currently  in Pain?  No/denies         Neuro Re-Ed  In // bars: All standing interventions require CGA due to instability and limited capacity for prolonged stability on LLE. Verbal cueing and encouragement for task progression/ body mechanics required.   -Airex pad: [place] ball transfer with LUE 3 minutes. Verbal cueing for placement with CGA for stability.   -Airex pad: 3lb bar: forward bench press 10x, straight arm raise 10x    -Airex pad: balloon taps with LUE only 2 minutes reaching inside and outside BOS    -ambulate backwards. Slight rotation towards right due to right LE stepping back further than left 4x length of // bars. CGA     TherEx:   side step up and down 6" step each direction, BUE support and verbal cueing for foot placement for  enough room for second foot 10x. CGA  Ambulation without AD in hallway 2x125 feet with verbal cues for improving forward momentum and to promote L arm swing    Ambulate ascending/descending carpeted ramp. Ascending, verbal cueing for heel strike and clearance of feet required. Descending, requires verbal cueing and Min A for deceleration second trial due to left hip fatigue.  2 trials with CGA.   -modified squats 15x with max verbal cueing for gluteal activation and keeping knees behind toes                           PT Education - 03/18/18 1124    Education provided  Yes    Education Details  balance, stability, incline/decline negotiation     Person(s) Educated  Patient    Methods  Explanation;Demonstration;Tactile cues;Verbal cues    Comprehension  Verbalized understanding;Returned demonstration;Verbal cues required;Tactile cues required;Need further instruction       PT Short Term Goals - 02/11/18 1032      PT SHORT TERM GOAL #1   Title  Patient will be independent in HEP demonstrating successful between session carryover and improve functional mobility and ease of completing ADLs.    Baseline  10/8: HEP given next session 11/11: HEP compliance    Time  2    Period  Weeks    Status  Achieved      PT SHORT TERM GOAL #2   Title  Patient will be able to transfer sit<>Stand from regular height chair without pushing on arm rests to exhibit improved independence improved functional strength.     Baseline  10/8: BUE support 11/11: hands on knees    Time  2    Period  Weeks    Status  Achieved        PT Long Term Goals - 02/11/18 1032      PT LONG TERM GOAL #1   Title  Patient will ambulate >1084f with rollator during 626m demonstrating improved community ambulation and ease with shopping    Baseline  10/8: 84589f1/11: 890f66f/2: 1030 ft with rollator     Time  8    Period  Weeks    Status  Achieved      PT LONG TERM GOAL #2   Title  Patient (> 60  68ars old) will complete five times sit to stand test in < 15 seconds indicating an increased LE strength and improved balance.    Baseline  10/8: 26seconds with BUE 11/11: 24 seconds BUE 12/2: 16 seconds with SUE support 02/04/18: 23.8 sec BUE support, 1 LOB inititating test.1/27: 13 seconds SUE  support, 1 episode of LOB     Time  8    Period  Weeks    Status  Partially Met    Target Date  04/08/18      PT LONG TERM GOAL #3   Title  Patient will demonstrate an improved Berg Balance Score of >42/56 as to demonstrate improved balance with ADLs such as sitting/standing and transfer balance and reduced fall risk.     Baseline  10/8: 37/56 11/11: 40/56 12/2: 43/56     Time  8    Period  Weeks    Status  Achieved      PT LONG TERM GOAL #4   Title  Patient will improve LEFS to 54/80 to demonstrate improved fucnctional strenth and mobility and ease of indepedence    Baseline  10/8: 45/80 11/11: 41/80 12/2: 45/80 1/27:  48/80    Time  8    Period  Weeks    Status  Partially Met    Target Date  04/08/18      PT LONG TERM GOAL #5   Title  Patient will be able to load and unload rollator indepedently to improve independence and quality of life.     Baseline  10/8: needs assistance to put rollator in and out of car 11/11: requires assistance; 02/04/2018: patient has not attempted 1/27: has not attempted due to illness    Time  8    Period  Weeks    Status  On-going    Target Date  04/08/18      PT LONG TERM GOAL #6   Title  Patient will demonstrate an improved Berg Balance Score of >49/56 as to demonstrate improved balance with ADLs such as sitting/standing and transfer balance and reduced fall risk.     Baseline  12/2: 43/56; 02/04/18: 33/56 (patient has significant fatigue 2/2 to upper respiratory infection)    Time  8    Period  Weeks    Status  On-going    Target Date  04/08/18      PT LONG TERM GOAL #7   Title  Patient will demonstrate ability to ambulate without AD 200 ft Independently  to increase independence in mobility.     Baseline  12/2: requires rollator 02/04/18: requires CGA/SBA, no AD 1/27: requires CGA without AD     Time  8    Period  Weeks    Status  On-going    Target Date  04/08/18            Plan - 03/18/18 1249    Clinical Impression Statement  Patient demonstrates good motivation throughout session. She continues to be limited in prolonged mobility limiting her capacity for ambulation due to fatigue. Negotiation of ascending/descending ramps performed with no episodes of LOB. Patient more challenged with eccentric muscle control. The patient benefits from frequent verbal reassurance and encouragement. The patient will continue to benefit from skilled PT in order to work towards goals and to maximize safety and independence with functional mobility.    Rehab Potential  Good    Clinical Impairments Affecting Rehab Potential  (+) improvement made within last year, eagerness to participate (-) HEP compliance, safety awareness     PT Frequency  2x / week    PT Duration  8 weeks    PT Treatment/Interventions  ADLs/Self Care Home Management;Cryotherapy;Electrical Stimulation;Iontophoresis 49m/ml Dexamethasone;Moist Heat;Ultrasound;DME Instruction;Gait training;Stair training;Balance training;Therapeutic exercise;Therapeutic activities;Functional mobility training;Neuromuscular re-education;Patient/family education;Manual techniques;Passive range of motion;Energy conservation    PT Next  Visit Plan  Continue with program. Ambulate without rollator to challenge balance. Sit to stands for strengthening. Progress static and dynamic balance. Review/revise HEP.    PT Home Exercise Plan  standing hip abduction, standing marches, standing hip extension, standing hamstring curls, mini squats    Consulted and Agree with Plan of Care  Patient       Patient will benefit from skilled therapeutic intervention in order to improve the following deficits and impairments:   Abnormal gait, Decreased activity tolerance, Decreased balance, Decreased coordination, Decreased endurance, Decreased mobility, Decreased range of motion, Decreased safety awareness, Decreased strength, Difficulty walking, Impaired flexibility, Impaired perceived functional ability, Improper body mechanics, Postural dysfunction, Pain, Impaired UE functional use, Decreased knowledge of precautions  Visit Diagnosis: Muscle weakness (generalized)  Unsteadiness on feet  Neurologic gait disorder     Problem List Patient Active Problem List   Diagnosis Date Noted  . Cognitive deficits   . Labile blood pressure   . Acute blood loss anemia   . Slow transit constipation   . Vascular headache   . 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 Hydrocephalus ([place]) 07/03/2017  . 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 Pelvic fracture ([place]) 04/18/2016  . CD (celiac disease) 08/04/2014  . Cancer of upper lobe of right lung ([place]) 08/04/2014  . Age related osteoporosis 11/26/2013  . Absolute anemia 05/21/2013  . H/O malignant neoplasm 05/21/2013  . HLD (hyperlipidemia) 05/21/2013    Janna Arch, PT, DPT   03/18/2018, 12:49 PM  [place] MAIN [facility] SERVICES [address] [place], [place], [address] Phone: [phone]   Fax:  [phone]  Name: Krista Evans MRN: [id number] Date of Birth: [birth year]

## 2018-03-20 ENCOUNTER — Ambulatory Visit: Payer: PPO

## 2018-03-20 DIAGNOSIS — R269 Unspecified abnormalities of gait and mobility: Secondary | ICD-10-CM

## 2018-03-20 DIAGNOSIS — M6281 Muscle weakness (generalized): Secondary | ICD-10-CM | POA: Diagnosis not present

## 2018-03-20 DIAGNOSIS — R2681 Unsteadiness on feet: Secondary | ICD-10-CM

## 2018-03-20 NOTE — Therapy (Addendum)
[place] [facility] SERVICES [address] [place], [place], [address] Phone: [phone]   Fax:  [phone]  Physical Therapy Progress Note  Dates of reporting period  02/04/2018   to   03/20/2018   Patient Details  Name: Krista Evans MRN: [id number] Date of Birth: [birth year] Referring Provider (PT): Ramonita Lab   Encounter Date: 03/20/2018  PT End of Session - 03/20/18 1120    Visit Number  36    Number of Visits  44    Date for PT Re-Evaluation  04/08/18    Authorization Type  10/10 starting 1/20 (next 1/10 starting 03/20/2018)    PT Start Time  1018    PT Stop Time  1057    PT Time Calculation (min)  39 min    Equipment Utilized During Treatment  Gait belt    Activity Tolerance  Patient tolerated treatment well    Behavior During Therapy  WFL for tasks assessed/performed       Past Medical History:  Diagnosis Date  . Abnormal Q waves on electrocardiogram   . Anemia   . Aortic atherosclerosis ([place])    "I could possibly have it, my grandmother had it"  . Cancer [facility]) 05/2011   Right upper Lung CA with partial Lobectomy.  . Celiac disease   . Celiac disease   . Dyspnea    with exertion  . Hyperlipidemia   . Hyperlipidemia   . Lung mass   . Meningioma ([place])   . Osteoporosis   . Personal history of chemotherapy   . Personal history of radiation therapy     Past Surgical History:  Procedure Laterality Date  . LUNG LOBECTOMY     right lung  . 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   Subjective Assessment - 03/20/18 1118    Subjective  Patient reports that she is doing well today and that she is enjoying the warmer weather. She denies pain today and falls/stumbles since last visit. She notes that she has been consistently  performing her HEP daily one time a day    Pertinent History  Patient stated she received therapy here about a year ago and went home and did not do a lot of exercise. Patient reports around June 2019 she had an MRI which showed enlarged ventricles and hydrocephalus and shunt was placed. Spent about a week and half in the hospital where she also received therapy. Also received home health PT and OT for about 3 weeks when discharged from hospital but has not done her exercises since. States she is not aware of any shunt precautions but feels the shunt which is described as a sharp pain when she lays late and leans forward. Reports her doctor is currently unaware of these symptoms. Patient states she walks around the house daily and 1x week she walks down the street and back with husband. Patient reports L leg aching after she begins walking to bedroom and back with rollator. Reports this has been happening for a couple years. Patient would like to get stronger, improve balance, and become more independent. Currently requires assistance getting rollator in and out of car.      Limitations  Lifting;Standing;Walking;House hold activities    How long can you sit comfortably?  n/a    How long can  you stand comfortably?  5 minutes    How long can you walk comfortably?  limited but patient unsure of duration     Patient Stated Goals  improve balance, strength, be more independent     Currently in Pain?  No/denies       Re-Assessment and Performance of Goals and Outcome Measures . LEFS: 36/80  . Berg: 39/56 (improvement from last assessment) . 5x STS: 14 seconds; SUE 1 LOB on last repetition due to fatigue (comparable to last assessment)  New Assessment based on Patient-Reported Goals . DGI (performed without AD): 12/24 (interpretation: scores of < 19/24 indicative of increased risk for falls)    All standing goals require CGA at all times due to instability as well as seated rest breaks from fatigue.  Patient requires cueing and max verbal encouragement due to fear of LOB.                        PT Education - 03/20/18 1119    Education provided  Yes    Education Details  balance, stability, POC, goals    Person(s) Educated  Patient    Methods  Explanation;Demonstration;Tactile cues;Verbal cues    Comprehension  Verbalized understanding;Returned demonstration;Verbal cues required;Tactile cues required;Need further instruction       PT Short Term Goals - 03/20/18 1148      PT SHORT TERM GOAL #1   Title  Patient will be independent in HEP demonstrating successful between session carryover and improve functional mobility and ease of completing ADLs.    Baseline  10/8: HEP given next session 11/11: HEP compliance    Time  2    Period  Weeks    Status  Achieved      PT SHORT TERM GOAL #2   Title  Patient will be able to transfer sit<>Stand from regular height chair without pushing on arm rests to exhibit improved independence improved functional strength.     Baseline  10/8: BUE support 11/11: hands on knees    Time  2    Period  Weeks    Status  Achieved        PT Long Term Goals - 03/20/18 1149      PT LONG TERM GOAL #1   Title  Patient will ambulate >1077f with rollator during 67m demonstrating improved community ambulation and ease with shopping    Baseline  10/8: 84530f1/11: 890f79f/2: 1030 ft with rollator     Time  8    Period  Weeks    Status  Achieved      PT LONG TERM GOAL #2   Title  Patient (> 60 y16rs old) will complete five times sit to stand test in < 15 seconds indicating an increased LE strength and improved balance.    Baseline  10/8: 26seconds with BUE 11/11: 24 seconds BUE 12/2: 16 seconds with SUE support 02/04/18: 23.8 sec BUE support, 1 LOB inititating test.1/27: 13 seconds SUE support, 1 episode of LOB; 3/4: 14 esconds SUE support, 1 episode LOB on last repetition     Time  8    Period  Weeks    Status  Partially Met    Target  Date  04/08/18      PT LONG TERM GOAL #3   Title  Patient will demonstrate an improved Berg Balance Score of >42/56 as to demonstrate improved balance with ADLs such as sitting/standing and transfer balance and reduced fall risk.  Baseline  10/8: 37/56 11/11: 40/56 12/2: 43/56,     Time  8    Period  Weeks    Status  Achieved      PT LONG TERM GOAL #4   Title  Patient will improve LEFS to 54/80 to demonstrate improved fucnctional strenth and mobility and ease of indepedence    Baseline  10/8: 45/80 11/11: 41/80 12/2: 45/80 1/27:  48/80; 3/4: 36/80 (patient asked to complete as if she had to perform independently)    Time  8    Period  Weeks    Status  Partially Met    Target Date  04/08/18      PT LONG TERM GOAL #5   Title  Patient will be able to load and unload rollator indepedently to improve independence and quality of life.     Baseline  10/8: needs assistance to put rollator in and out of car 11/11: requires assistance; 02/04/2018: patient has not attempted 1/27: has not attempted due to illness; 3/4: patient has not attempted due to help from husband    Time  8    Period  Weeks    Status  On-going    Target Date  04/08/18      Additional Long Term Goals   Additional Long Term Goals  Yes      PT LONG TERM GOAL #6   Title  Patient will demonstrate an improved Berg Balance Score of >49/56 as to demonstrate improved balance with ADLs such as sitting/standing and transfer balance and reduced fall risk.     Baseline  12/2: 43/56; 02/04/18: 33/56 (patient has significant fatigue 2/2 to upper respiratory infection), 03/20/18: 39/56    Time  8    Period  Weeks    Status  On-going    Target Date  04/08/18      PT LONG TERM GOAL #7   Title  Patient will demonstrate ability to ambulate without AD 200 ft Independently to increase independence in mobility.     Baseline  12/2: requires rollator 02/04/18: requires CGA/SBA, no AD 1/27: requires CGA without AD; 03/20/18: requires CGA/SBA, no  AD; improved gait mechanics and LLE arm swing;    Time  8    Period  Weeks    Status  On-going    Target Date  04/08/18      PT LONG TERM GOAL #8   Title  The patient will demonstrate ability to perform single leg stance balance on each LE for at least 3 seconds without UE support in order to improve strength and balance as initial step towards stepping over object while walking.    Baseline  3/4: LLE: able to perform with BUE support, RLE: able to perform with unilateral UE support    Time  8    Period  Weeks    Status  New    Target Date  04/08/18            Plan - 03/20/18 1147    Clinical Impression Statement  Patient's goals were re-assessed as a part of a progress note this session. The patient continues to present to PT motivated and expresses a great desire to improve. The patient reports that she feels that her balance around her home and in the community as well as her BLE strength have improved tremendously with PT. She notes that even her husband has commented about how much stronger she is now as compared to weeks ago. The patient's Berg score improved  since last assessment on 02/04/18, indicating improvement with static and dynamic balance. The patient's 5x sit to stand time was comparable to last session and patient required same level of support, but patient's one loss of balance was very minimal and occurred with sitting on the very last repetition. In accordance with the patient's reported goals of wanting to improve her ability to walk onto a curb or over a toy, as well as walking around obstacles, the Dynamic Gait Index (DGI) was performed to assess the patient's dynamic balance with ambulation. The patient scored a 12/24, indicating difficulty with dynamic tasks with ambulation and ultimately categorizing the patient as an increased risk for falls. Patient's performance during goal assessment today may have been influenced by performance with new individual (SPT), as other  clinicians report patient's performance occasionally limited by doing goals with new individual for first time. The patient's condition has the potential to improve in response to therapy. Maximum improvement is yet to be obtained. The anticipated improvement is attainable and reasonable in a generally predictable time.     Rehab Potential  Good    Clinical Impairments Affecting Rehab Potential  (+) improvement made within last year, eagerness to participate (-) HEP compliance, safety awareness     PT Frequency  2x / week    PT Duration  8 weeks    PT Treatment/Interventions  ADLs/Self Care Home Management;Cryotherapy;Electrical Stimulation;Iontophoresis 42m/ml Dexamethasone;Moist Heat;Ultrasound;DME Instruction;Gait training;Stair training;Balance training;Therapeutic exercise;Therapeutic activities;Functional mobility training;Neuromuscular re-education;Patient/family education;Manual techniques;Passive range of motion;Energy conservation    PT Next Visit Plan  Continue with program. Ambulate without rollator to challenge balance. Sit to stands for strengthening. Progress static and dynamic balance. Review and progress HEP.    PT Home Exercise Plan  standing hip abduction, standing marches, standing hip extension, standing hamstring curls, mini squats    Consulted and Agree with Plan of Care  Patient       Patient will benefit from skilled therapeutic intervention in order to improve the following deficits and impairments:  Abnormal gait, Decreased activity tolerance, Decreased balance, Decreased coordination, Decreased endurance, Decreased mobility, Decreased range of motion, Decreased safety awareness, Decreased strength, Difficulty walking, Impaired flexibility, Impaired perceived functional ability, Improper body mechanics, Postural dysfunction, Pain, Impaired UE functional use, Decreased knowledge of precautions  Visit Diagnosis: Muscle weakness (generalized)  Unsteadiness on  feet  Neurologic gait disorder     Problem List Patient Active Problem List   Diagnosis Date Noted  . Cognitive deficits   . Labile blood pressure   . Acute blood loss anemia   . Slow transit constipation   . Vascular headache   . 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 [place]us ([place] 07/[place]  . Communicating [place] ([place] 07/03[place]  . Pelvic fracture ([place] 04/18/2016  . CD (celiac disease) 08/04/2014  . [place] of [place] of [place]04[place]  . Age related osteoporosis 11/26/2013  . Absolute anemia 05/21/2013  . H/O malignant neoplasm 05/21/2013  . HLD (hyperlipidemia) 05/21/2013   ZOrlean Patten SPT  This entire session was performed under direct supervision and direction of a licensed therapist/therapist assistant . I have personally read, edited and approve of the note as written.  MJanna Arch PT, DPT   03/20/2018, 3:15 PM  [facility] [address] [place] [address]Phone: [phone]  Fax:  [phone] Name: MDYMPHNA WADLEYMRN: [id number] of Birth: [birth year]

## 2018-03-25 ENCOUNTER — Ambulatory Visit: Payer: PPO

## 2018-03-25 DIAGNOSIS — M6281 Muscle weakness (generalized): Secondary | ICD-10-CM

## 2018-03-25 DIAGNOSIS — R269 Unspecified abnormalities of gait and mobility: Secondary | ICD-10-CM

## 2018-03-25 DIAGNOSIS — R2681 Unsteadiness on feet: Secondary | ICD-10-CM

## 2018-03-25 NOTE — Therapy (Signed)
[place] [facility] SERVICES [address] [place], [place], [address] Phone: [phone]   Fax:  [phone]  Physical Therapy Treatment  Patient Details  Name: Krista Evans MRN: [id number] Date of Birth: 01/28/1945 Referring Provider (PT): Ramonita Lab   Encounter Date: 03/25/2018  PT End of Session - 03/25/18 1059    Visit Number  37    Number of Visits  44    Date for PT Re-Evaluation  04/08/18    Authorization Type  1/10 starting 03/20/2018    PT Start Time  1030    PT Stop Time  1114    PT Time Calculation (min)  44 min    Equipment Utilized During Treatment  Gait belt    Activity Tolerance  Patient tolerated treatment well    Behavior During Therapy  WFL for tasks assessed/performed       Past Medical History:  Diagnosis Date  . Abnormal Q waves on electrocardiogram   . Anemia   . Aortic atherosclerosis ([place])    "I could possibly have it, my grandmother had it"  . Cancer [facility]) 05/2011   Right upper Lung CA with partial Lobectomy.  . Celiac disease   . Celiac disease   . Dyspnea    with exertion  . Hyperlipidemia   . Hyperlipidemia   . Lung mass   . Meningioma ([place])   . Osteoporosis   . Personal history of chemotherapy   . Personal history of radiation therapy     Past Surgical History:  Procedure Laterality Date  . LUNG LOBECTOMY     right lung  . 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  Subjective Assessment - 03/25/18 1042    Subjective  Patient reports no pain or LOB since last session. Has been compliant with HEP daily. No complaints at this time.     Pertinent History  Patient stated she received therapy here about a year ago and went home and did not do a lot of exercise. Patient reports around June 2019 she had an  MRI which showed enlarged ventricles and hydrocephalus and shunt was placed. Spent about a week and half in the hospital where she also received therapy. Also received home health PT and OT for about 3 weeks when discharged from hospital but has not done her exercises since. States she is not aware of any shunt precautions but feels the shunt which is described as a sharp pain when she lays late and leans forward. Reports her doctor is currently unaware of these symptoms. Patient states she walks around the house daily and 1x week she walks down the street and back with husband. Patient reports L leg aching after she begins walking to bedroom and back with rollator. Reports this has been happening for a couple years. Patient would like to get stronger, improve balance, and become more independent. Currently requires assistance getting rollator in and out of car.      Limitations  Lifting;Standing;Walking;House hold activities    How long can you sit comfortably?  n/a    How long can you stand comfortably?  5 minutes    How long can you walk comfortably?  limited but patient unsure of duration     Patient Stated Goals  improve balance, strength, be more independent  Currently in Pain?  No/denies          Neuro Re-Ed  In // bars: All standing interventions require CGA due to instability and limited capacity for prolonged stability on LLE. Verbal cueing and encouragement for task progression/ body mechanics required.      -Airex pad: 3lb bar: forward bench press 10x, straight arm raise 10x    -Airex pad: 6" step toe taps 10x each LE SUE support, challenging stabilizing on LLE.   Coordination toe taps on hedgehogs with PT guiding LE and color. 2 minutes. SUE support      TherEx:   side step up and down 6" step each direction, BUE support and verbal cueing for foot placement for enough room for second foot 10x. CGA  sit to stand from raised plinth table with ball x 10 trials.verbal and  tactile cueing for foot placement and keeping knees apart.     Ambulation without AD in hallway 330f  feet with verbal cues for improving forward momentum and to promote L arm swing. CGA.   Quantum Leg press machine: Two legged2x10 at115 pounds; patient requires intermittent verbal cue to keep knees aligned and from falling inwards  Quantum Leg press single leg: #45 12x each LE, verbal cueing for muscle recruitment and alignment  -modified squats 15x with max verbal cueing for gluteal activation and keeping knees behind toes                      PT Education - 03/25/18 1047    Education provided  Yes    Education Details  exercise technique, stability     Person(s) Educated  Patient    Methods  Explanation;Demonstration;Verbal cues;Tactile cues    Comprehension  Verbalized understanding;Returned demonstration;Verbal cues required;Tactile cues required;Need further instruction       PT Short Term Goals - 03/20/18 1148      PT SHORT TERM GOAL #1   Title  Patient will be independent in HEP demonstrating successful between session carryover and improve functional mobility and ease of completing ADLs.    Baseline  10/8: HEP given next session 11/11: HEP compliance    Time  2    Period  Weeks    Status  Achieved      PT SHORT TERM GOAL #2   Title  Patient will be able to transfer sit<>Stand from regular height chair without pushing on arm rests to exhibit improved independence improved functional strength.     Baseline  10/8: BUE support 11/11: hands on knees    Time  2    Period  Weeks    Status  Achieved        PT Long Term Goals - 03/20/18 1149      PT LONG TERM GOAL #1   Title  Patient will ambulate >100106fwith rollator during 16m70mdemonstrating improved community ambulation and ease with shopping    Baseline  10/8: [id number]/11: [id number]: 1030 ft with rollator     Time  8    Period  Weeks    Status  Achieved      PT LONG TERM GOAL #2   Title   Patient (> 60 ye8s old) will complete five times sit to stand test in < 15 seconds indicating an increased LE strength and improved balance.    Baseline  10/8: 26seconds with BUE 11/11: 24 seconds BUE 12/2: 16 seconds with SUE support 02/04/18: 23.8 sec BUE support, 1 LOB inititating test.1/27:  13 seconds SUE support, 1 episode of LOB; 3/4: 14 esconds SUE support, 1 episode LOB on last repetition     Time  8    Period  Weeks    Status  Partially Met    Target Date  04/08/18      PT LONG TERM GOAL #3   Title  Patient will demonstrate an improved Berg Balance Score of >42/56 as to demonstrate improved balance with ADLs such as sitting/standing and transfer balance and reduced fall risk.     Baseline  10/8: 37/56 11/11: 40/56 12/2: 43/56,     Time  8    Period  Weeks    Status  Achieved      PT LONG TERM GOAL #4   Title  Patient will improve LEFS to 54/80 to demonstrate improved fucnctional strenth and mobility and ease of indepedence    Baseline  10/8: 45/80 11/11: 41/80 12/2: 45/80 1/27:  48/80; 3/4: 36/80 (patient asked to complete as if she had to perform independently)    Time  8    Period  Weeks    Status  Partially Met    Target Date  04/08/18      PT LONG TERM GOAL #5   Title  Patient will be able to load and unload rollator indepedently to improve independence and quality of life.     Baseline  10/8: needs assistance to put rollator in and out of car 11/11: requires assistance; 02/04/2018: patient has not attempted 1/27: has not attempted due to illness; 3/4: patient has not attempted due to help from husband    Time  8    Period  Weeks    Status  On-going    Target Date  04/08/18      Additional Long Term Goals   Additional Long Term Goals  Yes      PT LONG TERM GOAL #6   Title  Patient will demonstrate an improved Berg Balance Score of >49/56 as to demonstrate improved balance with ADLs such as sitting/standing and transfer balance and reduced fall risk.     Baseline  12/2:  43/56; 02/04/18: 33/56 (patient has significant fatigue 2/2 to upper respiratory infection), 03/20/18: 39/56    Time  8    Period  Weeks    Status  On-going    Target Date  04/08/18      PT LONG TERM GOAL #7   Title  Patient will demonstrate ability to ambulate without AD 200 ft Independently to increase independence in mobility.     Baseline  12/2: requires rollator 02/04/18: requires CGA/SBA, no AD 1/27: requires CGA without AD; 03/20/18: requires CGA/SBA, no AD; improved gait mechanics and LLE arm swing;    Time  8    Period  Weeks    Status  On-going    Target Date  04/08/18      PT LONG TERM GOAL #8   Title  The patient will demonstrate ability to perform single leg stance balance on each LE for at least 3 seconds without UE support in order to improve strength and balance as initial step towards stepping over object while walking.    Baseline  3/4: LLE: able to perform with BUE support, RLE: able to perform with unilateral UE support    Time  8    Period  Weeks    Status  New    Target Date  04/08/18  Plan - 03/25/18 1100    Clinical Impression Statement  Patient challenged with sit to stands from standard height, improved with correction for foot placement and knee activation. Patient challenged with stability on LLE and has a fear of LOB resulting in desire to hold with UE's for stability. Coordination with LLE is challenged with poor depth perceptions. The patient will continue to benefit from skilled PT in order to work towards goals and to maximize safety and independence with functional mobility.    Rehab Potential  Good    Clinical Impairments Affecting Rehab Potential  (+) improvement made within last year, eagerness to participate (-) HEP compliance, safety awareness     PT Frequency  2x / week    PT Duration  8 weeks    PT Treatment/Interventions  ADLs/Self Care Home Management;Cryotherapy;Electrical Stimulation;Iontophoresis 22m/ml Dexamethasone;Moist  Heat;Ultrasound;DME Instruction;Gait training;Stair training;Balance training;Therapeutic exercise;Therapeutic activities;Functional mobility training;Neuromuscular re-education;Patient/family education;Manual techniques;Passive range of motion;Energy conservation    PT Next Visit Plan  Continue with program. Ambulate without rollator to challenge balance. Sit to stands for strengthening. Progress static and dynamic balance. Review and progress HEP.    PT Home Exercise Plan  standing hip abduction, standing marches, standing hip extension, standing hamstring curls, mini squats    Consulted and Agree with Plan of Care  Patient       Patient will benefit from skilled therapeutic intervention in order to improve the following deficits and impairments:  Abnormal gait, Decreased activity tolerance, Decreased balance, Decreased coordination, Decreased endurance, Decreased mobility, Decreased range of motion, Decreased safety awareness, Decreased strength, Difficulty walking, Impaired flexibility, Impaired perceived functional ability, Improper body mechanics, Postural dysfunction, Pain, Impaired UE functional use, Decreased knowledge of precautions  Visit Diagnosis: Muscle weakness (generalized)  Unsteadiness on feet  Neurologic gait disorder     Problem List Patient Active Problem List   Diagnosis Date Noted  . Cognitive deficits   . Labile blood pressure   . Acute blood loss anemia   . Slow transit constipation   . Vascular headache   . 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 [place]us ([place] 07/03[place]  . 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 Pelvic fracture ([place] 04/18/2016  . CD (celiac disease) 08/04/2014  . Cancer of [place] of [place] lung ([place] 08/04/2014  . Age related osteoporosis 11/26/2013  . Absolute anemia 05/21/2013  . H/O malignant neoplasm 05/21/2013  . HLD (hyperlipidemia) 05/21/2013   MJanna Arch PT, DPT   03/25/2018, 11:15 AM  [facility] [address] [address]Phone: [phone]  Fax:  [phone] Name: MSUMAYYA MUHAMRN: [id number] of Birth: 3March 22, 1947

## 2018-03-27 ENCOUNTER — Ambulatory Visit: Payer: PPO

## 2018-03-27 ENCOUNTER — Other Ambulatory Visit: Payer: Self-pay

## 2018-03-27 DIAGNOSIS — R269 Unspecified abnormalities of gait and mobility: Secondary | ICD-10-CM

## 2018-03-27 DIAGNOSIS — M6281 Muscle weakness (generalized): Secondary | ICD-10-CM

## 2018-03-27 DIAGNOSIS — R2681 Unsteadiness on feet: Secondary | ICD-10-CM

## 2018-03-27 NOTE — Therapy (Addendum)
[place] MAIN [facility] SERVICES [address] [place], [place], [address] Phone: [phone]   Fax:  [phone]  Physical Therapy Treatment  Patient Details  Name: Krista Evans MRN: [id number] Date of Birth: 04-Nov-1945 Referring Provider (PT): Ramonita Lab   Encounter Date: 03/27/2018  PT End of Session - 03/27/18 1130    Visit Number  38    Number of Visits  44    Date for PT Re-Evaluation  04/08/18    Authorization Type  2/10 starting 03/20/2018    PT Start Time  1024    PT Stop Time  1110    PT Time Calculation (min)  46 min    Equipment Utilized During Treatment  Gait belt    Activity Tolerance  Patient tolerated treatment well    Behavior During Therapy  WFL for tasks assessed/performed       Past Medical History:  Diagnosis Date  . Abnormal Q waves on electrocardiogram   . Anemia   . Aortic atherosclerosis ([place])    "I could possibly have it, my grandmother had it"  . Cancer [facility]) 05/2011   Right upper Lung CA with partial Lobectomy.  . Celiac disease   . Celiac disease   . Dyspnea    with exertion  . Hyperlipidemia   . Hyperlipidemia   . Lung mass   . Meningioma ([place])   . Osteoporosis   . Personal history of chemotherapy   . Personal history of radiation therapy     Past Surgical History:  Procedure Laterality Date  . LUNG LOBECTOMY     right lung  . 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  Subjective Assessment - 03/27/18 1128    Subjective  Patient reports no pain or LOB since last session. Notes that she felt good after last session. Has been compliant with HEP daily. No complaints at this time.     Pertinent History  Patient stated she received therapy here about a year ago and went home and did not do a lot of  exercise. Patient reports around June 2019 she had an MRI which showed enlarged ventricles and hydrocephalus and shunt was placed. Spent about a week and half in the hospital where she also received therapy. Also received home health PT and OT for about 3 weeks when discharged from hospital but has not done her exercises since. States she is not aware of any shunt precautions but feels the shunt which is described as a sharp pain when she lays late and leans forward. Reports her doctor is currently unaware of these symptoms. Patient states she walks around the house daily and 1x week she walks down the street and back with husband. Patient reports L leg aching after she begins walking to bedroom and back with rollator. Reports this has been happening for a couple years. Patient would like to get stronger, improve balance, and become more independent. Currently requires assistance getting rollator in and out of car.      Limitations  Lifting;Standing;Walking;House hold activities    How long can you sit comfortably?  n/a    How long can you stand comfortably?  5 minutes    How long can you walk comfortably?  limited but patient unsure of duration     Patient Stated Goals  improve  balance, strength, be more independent     Currently in Pain?  No/denies         In // bars: All standing interventions require CGA due to instability and limited capacity for prolonged stability on LLE. Verbal cueing and encouragement for task progression/ body mechanics required.   Neuro Re-Ed:  Ambulate without AD holding ball moving ball side to side in horizontal plane 2x75 feet with CGA; patient notes that activity is a medium difficulty  Ambulate without AD holding ball moving ball up and down in vertical plane 2x75 feet with CGA; patient notes that it is slightly easier than moving ball in horizontal plane but it is a good challenge   Forward alternating taps onto 6" step in // bars 1x10 and 1x7 with unilateral UE  support; CGA; patient demonstrates fatigue in LLE during performance of second set and is only able to complete 7 repetitions  Lateral walking in // bars down and back x4; CGA; no LOB; patient performs first time down and back with bilateral UE support for balance, however by end of fourth bout, patient does not require use of BUE for balance; patient benefits from frequent verbal cues to decrease step length and slow down   TherEx:  Ambulationwithout AD in [id number] feetwithrare verbal cues for improving forward momentumand to promote L arm swing. CGA; towards end of bout, patient indicates that she is fatigued and requests a seated rest break; no LOB  Sit to stands from raised plinth holding rainbow ball to discourage use of BUE to assist 1x5 at height 1, 1x5 after lowering table to height 2, 1x5 after lowering table to height 3, 1x10 at height 3; patient demonstrates occasional posterior loss of balance during performance of sit to stand with initial repetitions when she is moving too quickly; patient benefits from intermittent verbal cues to move more slowly and to push through legs; CGA  Seated adduction squeezes against green ball x10 with 3 second holds   Patient's vitals monitored throughout session for patient safety due to patient-reported increase in shortness of breath with activity today                        PT Education - 03/27/18 1129    Education provided  Yes    Education Details  exercise technique, stability, strengthening, stability with dynamic activity/ambulation    Person(s) Educated  Patient    Methods  Explanation;Demonstration;Tactile cues;Verbal cues    Comprehension  Verbalized understanding;Returned demonstration;Verbal cues required;Tactile cues required;Need further instruction       PT Short Term Goals - 03/20/18 1148      PT SHORT TERM GOAL #1   Title  Patient will be independent in HEP demonstrating successful between session  carryover and improve functional mobility and ease of completing ADLs.    Baseline  10/8: HEP given next session 11/11: HEP compliance    Time  2    Period  Weeks    Status  Achieved      PT SHORT TERM GOAL #2   Title  Patient will be able to transfer sit<>Stand from regular height chair without pushing on arm rests to exhibit improved independence improved functional strength.     Baseline  10/8: BUE support 11/11: hands on knees    Time  2    Period  Weeks    Status  Achieved        PT Long Term Goals - 03/20/18 1149  PT LONG TERM GOAL #1   Title  Patient will ambulate >1061f with rollator during 675m demonstrating improved community ambulation and ease with shopping    Baseline  10/8: 84557f1/11: [id number]/2: 1030 ft with rollator     Time  8    Period  Weeks    Status  Achieved      PT LONG TERM GOAL #2   Title  Patient (> 60 y11rs old) will complete five times sit to stand test in < 15 seconds indicating an increased LE strength and improved balance.    Baseline  10/8: 26seconds with BUE 11/11: 24 seconds BUE 12/2: 16 seconds with SUE support 02/04/18: 23.8 sec BUE support, 1 LOB inititating test.1/27: 13 seconds SUE support, 1 episode of LOB; 3/4: 14 esconds SUE support, 1 episode LOB on last repetition     Time  8    Period  Weeks    Status  Partially Met    Target Date  04/08/18      PT LONG TERM GOAL #3   Title  Patient will demonstrate an improved Berg Balance Score of >42/56 as to demonstrate improved balance with ADLs such as sitting/standing and transfer balance and reduced fall risk.     Baseline  10/8: 37/56 11/11: 40/56 12/2: 43/56,     Time  8    Period  Weeks    Status  Achieved      PT LONG TERM GOAL #4   Title  Patient will improve LEFS to 54/80 to demonstrate improved fucnctional strenth and mobility and ease of indepedence    Baseline  10/8: 45/80 11/11: 41/80 12/2: 45/80 1/27:  48/80; 3/4: 36/80 (patient asked to complete as if she had to perform  independently)    Time  8    Period  Weeks    Status  Partially Met    Target Date  04/08/18      PT LONG TERM GOAL #5   Title  Patient will be able to load and unload rollator indepedently to improve independence and quality of life.     Baseline  10/8: needs assistance to put rollator in and out of car 11/11: requires assistance; 02/04/2018: patient has not attempted 1/27: has not attempted due to illness; 3/4: patient has not attempted due to help from husband    Time  8    Period  Weeks    Status  On-going    Target Date  04/08/18      Additional Long Term Goals   Additional Long Term Goals  Yes      PT LONG TERM GOAL #6   Title  Patient will demonstrate an improved Berg Balance Score of >49/56 as to demonstrate improved balance with ADLs such as sitting/standing and transfer balance and reduced fall risk.     Baseline  12/2: 43/56; 02/04/18: 33/56 (patient has significant fatigue 2/2 to upper respiratory infection), 03/20/18: 39/56    Time  8    Period  Weeks    Status  On-going    Target Date  04/08/18      PT LONG TERM GOAL #7   Title  Patient will demonstrate ability to ambulate without AD 200 ft Independently to increase independence in mobility.     Baseline  12/2: requires rollator 02/04/18: requires CGA/SBA, no AD 1/27: requires CGA without AD; 03/20/18: requires CGA/SBA, no AD; improved gait mechanics and LLE arm swing;    Time  8  Period  Weeks    Status  On-going    Target Date  04/08/18      PT LONG TERM GOAL #8   Title  The patient will demonstrate ability to perform single leg stance balance on each LE for at least 3 seconds without UE support in order to improve strength and balance as initial step towards stepping over object while walking.    Baseline  3/4: LLE: able to perform with BUE support, RLE: able to perform with unilateral UE support    Time  8    Period  Weeks    Status  New    Target Date  04/08/18            Plan - 03/27/18 1148    Clinical  Impression Statement  The patient continues to progress with BLE strengthening and dynamic balance activities. The patient was able to perform a total of 25 sit to stands today from an elevated surface, and her form did not change/decline with the last few repetitions although patient reported fatigue. The patient also demonstrated better gait mechanics and speed when performing walking activities with the rainbow ball today as compared to attempts during previous sessions. However, the patient demonstrated increased fatigue today and required more frequent rest breaks that were longer in duration, especially following ambulation activities. The patient will continue to benefit from skilled PT in order to work towards goals and to maximize safety and independence with functional mobility.    Rehab Potential  Good    Clinical Impairments Affecting Rehab Potential  (+) improvement made within last year, eagerness to participate (-) HEP compliance, safety awareness     PT Frequency  2x / week    PT Duration  8 weeks    PT Treatment/Interventions  ADLs/Self Care Home Management;Cryotherapy;Electrical Stimulation;Iontophoresis 65m/ml Dexamethasone;Moist Heat;Ultrasound;DME Instruction;Gait training;Stair training;Balance training;Therapeutic exercise;Therapeutic activities;Functional mobility training;Neuromuscular re-education;Patient/family education;Manual techniques;Passive range of motion;Energy conservation    PT Next Visit Plan  Continue with program. Ambulate without rollator to challenge balance. Sit to stands for strengthening. Progress static and dynamic balance. Review and progress HEP.    PT Home Exercise Plan  standing hip abduction, standing marches, standing hip extension, standing hamstring curls, mini squats    Consulted and Agree with Plan of Care  Patient       Patient will benefit from skilled therapeutic intervention in order to improve the following deficits and impairments:  Abnormal  gait, Decreased activity tolerance, Decreased balance, Decreased coordination, Decreased endurance, Decreased mobility, Decreased range of motion, Decreased safety awareness, Decreased strength, Difficulty walking, Impaired flexibility, Impaired perceived functional ability, Improper body mechanics, Postural dysfunction, Pain, Impaired UE functional use, Decreased knowledge of precautions  Visit Diagnosis: Muscle weakness (generalized)  Unsteadiness on feet  Neurologic gait disorder     Problem List Patient Active Problem List   Diagnosis Date Noted  . Cognitive deficits   . Labile blood pressure   . Acute blood loss anemia   . Slow transit constipation   . Vascular headache   . 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 [place] 07[place]  . Communicating [place] 07/03[place]  . Pelvic fracture ([place] 04/18/2016  . CD (celiac disease) 08/04/2014  . Cancer of upper lobe of right lung ([place] 08/04/2014  . Age related osteoporosis 11/26/2013  . Absolute anemia 05/21/2013  . H/O malignant neoplasm 05/21/2013  . HLD (hyperlipidemia) 05/21/2013   ZOrlean Patten SPT  This entire session was performed under direct supervision and direction of a  licensed Chiropractor . I have personally read, edited and approve of the note as written.  Janna Arch, PT, DPT   03/27/2018, 1:16 PM  [place] MAIN [facility] SERVICES [address] [place], [place], [address] Phone: [phone]   Fax:  [phone]  Name: Krista Evans MRN: [id number] Date of Birth: [birth year]

## 2018-04-01 ENCOUNTER — Ambulatory Visit: Payer: PPO

## 2018-04-03 ENCOUNTER — Ambulatory Visit: Payer: PPO

## 2018-04-08 ENCOUNTER — Ambulatory Visit: Payer: PPO

## 2018-04-08 NOTE — Therapy (Signed)
[place] MAIN [facility] SERVICES [address] [place], [place], [address] Phone: [phone]   Fax:  [phone]  Patient Details  Name: ELZA VARRICCHIO MRN: [id number] Date of Birth: 1945/10/22 Referring Provider:  No ref. provider found  Encounter Date: 04/08/2018  Patient called by PT to ensure patient is doing well, does not have questions, and review HEP. Called due to current outpatient closure for COVID- 19. Patient did not pick up PT call. PT left voicemail stating reason for call and number for call back for further questions/concerns.   Janna Arch, PT, DPT   04/08/2018, 4:15 PM  [place] MAIN [facility] SERVICES [address] [place], [place], [address] Phone: [phone]   Fax:  [phone]

## 2018-04-10 ENCOUNTER — Ambulatory Visit: Payer: PPO

## 2018-04-15 ENCOUNTER — Ambulatory Visit: Payer: PPO

## 2018-04-17 ENCOUNTER — Ambulatory Visit: Payer: PPO | Attending: Internal Medicine

## 2018-04-22 ENCOUNTER — Ambulatory Visit: Payer: PPO

## 2018-04-24 ENCOUNTER — Ambulatory Visit: Payer: PPO

## 2018-04-25 NOTE — Therapy (Signed)
[place] MAIN [facility] SERVICES [address] [place], [place], [address] Phone: [phone]   Fax:  [phone]  Patient Details  Name: Krista Evans MRN: [id number] Date of Birth: 19-Sep-1945 Referring Provider:  No ref. provider found  Encounter Date: 04/25/2018  The [facility] outpatient clinics are closed at this time due to the COVID-19 epidemic. Upon consideration of patient's status and needs, a home health referral is in order to help provide continued rehabilitation services care for the patient. Attempted to call patient, had to leave voicemail, awaiting call back to refer for home health.   Janna Arch, PT, DPT   04/25/2018, 2:53 PM  [place] MAIN [facility] SERVICES [address] [place], [place], [address] Phone: [phone]   Fax:  [phone]

## 2018-04-29 ENCOUNTER — Ambulatory Visit: Payer: PPO

## 2018-06-14 ENCOUNTER — Ambulatory Visit: Payer: PPO

## 2018-06-20 DIAGNOSIS — E7849 Other hyperlipidemia: Secondary | ICD-10-CM | POA: Diagnosis not present

## 2018-06-20 DIAGNOSIS — R778 Other specified abnormalities of plasma proteins: Secondary | ICD-10-CM | POA: Diagnosis not present

## 2018-06-20 DIAGNOSIS — D649 Anemia, unspecified: Secondary | ICD-10-CM | POA: Diagnosis not present

## 2018-06-21 DIAGNOSIS — R778 Other specified abnormalities of plasma proteins: Secondary | ICD-10-CM | POA: Diagnosis not present

## 2018-06-21 DIAGNOSIS — D649 Anemia, unspecified: Secondary | ICD-10-CM | POA: Diagnosis not present

## 2018-06-21 DIAGNOSIS — E7849 Other hyperlipidemia: Secondary | ICD-10-CM | POA: Diagnosis not present

## 2018-06-27 DIAGNOSIS — K900 Celiac disease: Secondary | ICD-10-CM | POA: Diagnosis not present

## 2018-06-27 DIAGNOSIS — Z85118 Personal history of other malignant neoplasm of bronchus and lung: Secondary | ICD-10-CM | POA: Diagnosis not present

## 2018-06-27 DIAGNOSIS — E7849 Other hyperlipidemia: Secondary | ICD-10-CM | POA: Diagnosis not present

## 2018-06-27 DIAGNOSIS — D649 Anemia, unspecified: Secondary | ICD-10-CM | POA: Diagnosis not present

## 2018-06-27 DIAGNOSIS — M810 Age-related osteoporosis without current pathological fracture: Secondary | ICD-10-CM | POA: Diagnosis not present

## 2018-06-27 DIAGNOSIS — R0600 Dyspnea, unspecified: Secondary | ICD-10-CM | POA: Diagnosis not present

## 2018-06-27 DIAGNOSIS — G912 (Idiopathic) normal pressure hydrocephalus: Secondary | ICD-10-CM | POA: Diagnosis not present

## 2018-06-27 DIAGNOSIS — I700 Atherosclerosis of aorta: Secondary | ICD-10-CM | POA: Diagnosis not present

## 2018-06-27 DIAGNOSIS — D329 Benign neoplasm of meninges, unspecified: Secondary | ICD-10-CM | POA: Diagnosis not present

## 2018-07-09 ENCOUNTER — Ambulatory Visit: Payer: PPO

## 2018-07-09 DIAGNOSIS — G912 (Idiopathic) normal pressure hydrocephalus: Secondary | ICD-10-CM | POA: Diagnosis not present

## 2018-07-16 ENCOUNTER — Other Ambulatory Visit: Payer: Self-pay

## 2018-07-16 ENCOUNTER — Ambulatory Visit: Payer: PPO | Attending: Internal Medicine

## 2018-07-16 DIAGNOSIS — R2681 Unsteadiness on feet: Secondary | ICD-10-CM | POA: Insufficient documentation

## 2018-07-16 DIAGNOSIS — R269 Unspecified abnormalities of gait and mobility: Secondary | ICD-10-CM | POA: Diagnosis not present

## 2018-07-16 DIAGNOSIS — M6281 Muscle weakness (generalized): Secondary | ICD-10-CM | POA: Insufficient documentation

## 2018-07-16 NOTE — Therapy (Signed)
[place] MAIN [facility] SERVICES [address] [place], [place], [address] Phone: [phone]   Fax:  [phone]  Physical Therapy Re-Evaluation  Patient Details  Name: Krista Evans MRN: [id number] Date of Birth: Apr 27, 1945 Referring Provider (PT): Ramonita Lab   Encounter Date: 07/16/2018  PT End of Session - 07/16/18 1253    Visit Number  1    Number of Visits  16    Date for PT Re-Evaluation  09/10/18    Authorization Type  1/10 re eval starting 6/30    PT Start Time  1112    PT Stop Time  1157    PT Time Calculation (min)  45 min    Equipment Utilized During Treatment  Gait belt    Activity Tolerance  Patient tolerated treatment well    Behavior During Therapy  WFL for tasks assessed/performed       Past Medical History:  Diagnosis Date  . Abnormal Q waves on electrocardiogram   . Anemia   . Aortic atherosclerosis ([place])    "I could possibly have it, my grandmother had it"  . Cancer [facility]) 05/2011   Right upper Lung CA with partial Lobectomy.  . Celiac disease   . Celiac disease   . Dyspnea    with exertion  . Hyperlipidemia   . Hyperlipidemia   . Lung mass   . Meningioma ([place])   . Osteoporosis   . Personal history of chemotherapy   . Personal history of radiation therapy     Past Surgical History:  Procedure Laterality Date  . LUNG LOBECTOMY     right lung  . 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   Subjective Assessment - 07/16/18 1248    Subjective  Patient is returning to outpatient physical therapy after closure for COVID.    Pertinent History  Patient is returning to outpatient physical therapy after closure for COVID. Due to patient's last session being in March, patient is being re-evaluated today. PMH includes  anemia, aortic atherosclerosis, cancer, celiac disease, dyspnea, HLD, meningioma, osteoporosis, history of chemo/radiation therapy, ventriculoperitoneal shunt (07/03/17).    Limitations  Lifting;Standing;Walking;House hold activities;Other (comment)    How long can you sit comfortably?  n/a    How long can you stand comfortably?  5 minutes    How long can you walk comfortably?  400 ft before fatigue/out of breathe.    Patient Stated Goals  Patient wants to be able to step up onto a curb without holding on    Currently in Pain?  No/denies      Patient is returning to outpatient physical therapy after closure for COVID. Due to patient's last session being in March, patient is being re-evaluated today. PMH includes anemia, aortic atherosclerosis, cancer, celiac disease, dyspnea, HLD, meningioma, osteoporosis, history of chemo/radiation therapy, ventriculoperitoneal shunt (07/03/17).   Patient wants to be able to step up onto a curb without holding on     [facility] PT Assessment - 07/16/18 0001      Assessment   Medical Diagnosis  L sided weakness     Onset Date/Surgical Date  06/16/17    Hand Dominance  Left    Prior Therapy  Yes      Precautions   Precautions  Fall      Restrictions   Weight  Bearing Restrictions  No      Balance Screen   Has the patient fallen in the past 6 months  No    Has the patient had a decrease in activity level because of a fear of falling?   Yes    Is the patient reluctant to leave their home because of a fear of falling?   Yes      [place]  Private residence    Living Arrangements  Spouse/significant other    Available Help at Discharge  Family    Type of [place] to enter    Entrance Stairs-Number of Steps  [place]  One level    [place] - standard;[place] - single point;Other (comment)      Prior Function   Level of Independence  Independent  with basic ADLs;Independent with household mobility with device    Vocation  Retired    Leisure  play games on phone      Cognition   Overall Cognitive Status  Within Functional Limits for tasks assessed      [place] to Stand  Able to stand  independently using hands    Standing Unsupported  Able to stand 2 minutes with supervision    Sitting with Back Unsupported but Feet Supported on Floor or Stool  Able to sit safely and securely 2 minutes    Stand to Sit  Uses backs of legs against chair to control descent    Transfers  Able to transfer safely, definite need of hands    Standing Unsupported with Eyes Closed  Able to stand 10 seconds with supervision    Standing Unsupported with Feet Together  Able to place feet together independently and stand for 1 minute with supervision    From Standing, Reach Forward with Outstretched Arm  Can reach forward >12 cm safely (5")    From Standing Position, Pick up Object from [place] to pick up shoe, needs supervision    From Standing Position, Turn to Look Behind Over each Shoulder  Looks behind from both sides and weight shifts well    Turn 360 Degrees  Able to turn 360 degrees safely but slowly    Standing Unsupported, Alternately Place Feet on Step/Stool  Able to complete >2 steps/needs minimal assist    Standing Unsupported, One Foot in [place] to take small step independently and hold 30 seconds    Standing on One Leg  Tries to lift leg/unable to hold 3 seconds but remains standing independently    Total Score  37      PAIN: Gets low back pain with prolonged walking.  No pain at rest  POSTURE: FHRS  PROM/AROM: Limited coordination/muscle tissue length of LLE  STRENGTH:  Graded on a 0-5 scale Muscle Group Left Right  Hip Flex 3+/5 4-/5  Hip Abd 3-/5 3/5  Hip Add 2+/5 3-/5  Hip Ext 2+/5 3/5  Hip IR/ER    Knee Flex 4-5 4/5  Knee Ext 4-/5 4/5  Ankle DF 3+/5 4-/5  Ankle PF 3+/5 4-/5    SENSATION: WFL  SPECIAL TESTS: Coordination: limited coordination and spatial awareness with LLE and LUE  FUNCTIONAL MOBILITY: STS: patient utilized excessive UE support for pushing up, has initial extension of spine resulting in limited stability   BALANCE: Dynamic Sitting  Balance  Normal Able to sit unsupported and weight shift across midline maximally   Good Able to sit unsupported and weight shift across midline moderately   Good-/Fair+ Able to sit unsupported and weight shift across midline minimally x  Fair Minimal weight shifting ipsilateral/front, difficulty crossing midline   Fair- Reach to ipsilateral side and unable to weight shift   Poor + Able to sit unsupported with min A and reach to ipsilateral side, unable to weight shift   Poor Able to sit unsupported with mod A and reach ipsilateral/front-can't cross midline     Standing Dynamic Balance  Normal Stand independently unsupported, able to weight shift and cross midline maximally   Good Stand independently unsupported, able to weight shift and cross midline moderately   Good-/Fair+ Stand independently unsupported, able to weight shift across midline minimally   Fair Stand independently unsupported, weight shift, and reach ipsilaterally, loss of balance when crossing midline x  Poor+ Able to stand with Min A and reach ipsilaterally, unable to weight shift   Poor Able to stand with Mod A and minimally reach ipsilaterally, unable to cross midline.     Static Sitting Balance  Normal Able to maintain balance against maximal resistance   Good Able to maintain balance against moderate resistance   Good-/Fair+ Accepts minimal resistance   Fair Able to sit unsupported without balance loss and without UE support x  Poor+ Able to maintain with Minimal assistance from individual or chair   Poor Unable to maintain balance-requires mod/max support from individual or chair     Static Standing Balance  Normal Able to maintain  standing balance against maximal resistance   Good Able to maintain standing balance against moderate resistance   Good-/Fair+ Able to maintain standing balance against minimal resistance   Fair Able to stand unsupported without UE support and without LOB for 1-2 min x  Fair- Requires Min A and UE support to maintain standing without loss of balance   Poor+ Requires mod A and UE support to maintain standing without loss of balance   Poor Requires max A and UE support to maintain standing balance without loss      GAIT: Patient ambulates with rollator at this time with external rotation of RLE and in-toeing of LLE that is exacerbated with fatigue.  OUTCOME MEASURES: TEST Outcome Interpretation  5 times sit<>stand 16 seconds BUE support  >19 yo, >15 sec indicates increased risk for falls  LEFS 32/56       6 minute walk test           880     Feet 1000 feet is community Water quality scientist 37/56 <36/56 (100% risk for falls), 37-45 (80% risk for falls); 46-51 (>50% risk for falls); 52-55 (lower risk <25% of falls)        Patient is a pleasant 73 year old female who presents to physical therapy for weakness and instability with limited capacity for functional mobility. Patient requires excessive use of BUE support for transfers due to limited strength and stability of LE's. She is challenged with prolonged ambulation with a limited 6 MWT relying upon rollator. Her BERG was impacted due to limited ability to stand with single LE without UE support as well as fear of movement without UE support. Patient will benefit from skilled physical therapy to increase strength and stability for decreased fall risk and improved capacity for functional mobility.     Treatment: 6" step toe taps with decreasing UE support  2 minutes 6" step up with decreasing UE support to single UE 10x each LE Standing balloon taps reaching inside/outside BOS        Objective measurements completed on  examination: See above findings.              PT Education - 07/16/18 1250    Education provided  Yes    Education Details  POC, goals,    Person(s) Educated  Patient    Methods  Explanation;Demonstration;Tactile cues;Verbal cues    Comprehension  Verbalized understanding;Returned demonstration;Verbal cues required;Tactile cues required;Need further instruction       PT Short Term Goals - 07/16/18 1304      PT SHORT TERM GOAL #1   Title  Patient will be independent in HEP demonstrating successful between session carryover and improve functional mobility and ease of completing ADLs.    Baseline  6/30: HEP given next session    Time  2    Period  Weeks    Status  New    Target Date  07/30/18      PT SHORT TERM GOAL #2   Title  Patient will be able to transfer sit<>Stand from regular height chair without pushing on arm rests to exhibit improved independence improved functional strength.     Baseline  6/30: requires excessive BUE support    Time  2    Period  Weeks    Status  New    Target Date  07/30/18        PT Long Term Goals - 07/16/18 1305      PT LONG TERM GOAL #1   Title  Patient will increase six minute walk test distance to >1000 for progression to community ambulator and improve gait ability    Baseline  6/30: 880 ft with rollator    Time  8    Period  Weeks    Status  New    Target Date  09/10/18      PT LONG TERM GOAL #2   Title  Patient (> 76 years old) will complete five times sit to stand test in < 15 seconds without UE support indicating an increased LE strength and improved balance.    Baseline  6/30: 16 seconds with excessive BUE support    Time  8    Period  Weeks    Status  New    Target Date  09/10/18      PT LONG TERM GOAL #3   Title  Patient will demonstrate an improved Berg Balance Score of >42/56 as to demonstrate improved balance with ADLs such as sitting/standing and transfer balance and reduced fall risk.     Baseline  6/30:  37/56    Time  8    Period  Weeks    Status  New    Target Date  09/10/18      PT LONG TERM GOAL #4   Title  Patient will improve LEFS to 54/80 to demonstrate improved fucnctional strenth and mobility and ease of indepedence    Baseline  6/30: 32/80    Time  8    Period  Weeks    Status  New    Target Date  09/10/18             Plan - 07/16/18 1255    Clinical Impression Statement  Patient is a pleasant 73 year old female who presents to physical therapy for weakness and instability with limited capacity for functional mobility. Patient  requires excessive use of BUE support for transfers due to limited strength and stability of LE's. She is challenged with prolonged ambulation with a limited 6 MWT relying upon rollator. Her BERG was impacted due to limited ability to stand with single LE without UE support as well as fear of movement without UE support. Patient will benefit from skilled physical therapy to increase strength and stability for decreased fall risk and improved capacity for functional mobility.    Personal Factors and Comorbidities  Age;Comorbidity 3+;Fitness;Past/Current Experience;Social Background;Time since onset of injury/illness/exacerbation    Comorbidities  anemia, cancer, celiac disease, dyspnea, meningioma, osteoporosis, HLD    Examination-Activity Limitations  Bed Mobility;Bathing;Bend;Carry;Dressing;Hygiene/Grooming;Locomotion Level;Reach Overhead;Sit;Squat;Stairs;Stand;Toileting;Transfers    Examination-Participation Restrictions  Church;Cleaning;Community Activity;Driving;Interpersonal Relationship;Meal Prep;Laundry;Shop;Volunteer;Yard Work    [employer]chant [employer] officer  Evolving/Moderate complexity    Clinical Decision Making  Moderate    Rehab Potential  Fair    PT Frequency  2x / week    PT Duration  8 weeks    PT Treatment/Interventions  ADLs/Self Care Home Management;Cryotherapy;Electrical Stimulation;Iontophoresis 50m/ml Dexamethasone;Moist  Heat;Ultrasound;DME Instruction;Gait training;Stair training;Balance training;Therapeutic exercise;Therapeutic activities;Functional mobility training;Neuromuscular re-education;Patient/family education;Manual techniques;Passive range of motion;Energy conservation    PT Next Visit Plan  give new HEP    Consulted and Agree with Plan of Care  Patient       Patient will benefit from skilled therapeutic intervention in order to improve the following deficits and impairments:  Abnormal gait, Decreased activity tolerance, Decreased balance, Decreased coordination, Decreased endurance, Decreased mobility, Decreased range of motion, Decreased safety awareness, Decreased strength, Difficulty walking, Impaired flexibility, Impaired perceived functional ability, Improper body mechanics, Postural dysfunction, Pain, Impaired UE functional use, Decreased knowledge of precautions, Cardiopulmonary status limiting activity, Decreased knowledge of use of DME  Visit Diagnosis: 1. Muscle weakness (generalized)   2. Unsteadiness on feet   3. Neurologic gait disorder        Problem List Patient Active Problem List   Diagnosis Date Noted  . Cognitive deficits   . Labile blood pressure   . Acute blood loss anemia   . Slow transit constipation   . Vascular headache   . 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 Hydrocephalus ([place] 07/03/2017  . Communicating [place]cephalus ([place] 07/03/2017  . Pelvic fracture ([place] 04/18/2016  . CD (celiac disease) 08/04/2014  . Cancer of upper lobe of right lung ([place] 08/04/2014  . Age related osteoporosis 11/26/2013  . Absolute anemia 05/21/2013  . H/O malignant neoplasm 05/21/2013  . HLD (hyperlipidemia) 05/21/2013   MJanna Arch PT, DPT   07/16/2018, 1:10 PM  [place] [facility] [address] [address]Phone: [phone]  Fax:  [phone] Name: Krista HAMSTRAMRN: [id number] of Birth:  [birth year]

## 2018-07-18 ENCOUNTER — Other Ambulatory Visit: Payer: Self-pay

## 2018-07-18 ENCOUNTER — Ambulatory Visit: Payer: PPO | Attending: Internal Medicine

## 2018-07-18 DIAGNOSIS — M6281 Muscle weakness (generalized): Secondary | ICD-10-CM | POA: Diagnosis not present

## 2018-07-18 DIAGNOSIS — R2681 Unsteadiness on feet: Secondary | ICD-10-CM | POA: Diagnosis not present

## 2018-07-18 DIAGNOSIS — R269 Unspecified abnormalities of gait and mobility: Secondary | ICD-10-CM | POA: Insufficient documentation

## 2018-07-18 NOTE — Therapy (Signed)
[place] MAIN [facility] SERVICES [address] [place], [place], [address] Phone: [phone]   Fax:  [phone]  Physical Therapy Treatment  Patient Details  Name: Krista Evans MRN: [id number] Date of Birth: 08/08/1945 Referring Provider (PT): Ramonita Lab   Encounter Date: 07/18/2018  PT End of Session - 07/18/18 1422    Visit Number  2    Number of Visits  16    Date for PT Re-Evaluation  09/10/18    Authorization Type  2/10 re eval starting 6/30    PT Start Time  1415    PT Stop Time  1501    PT Time Calculation (min)  46 min    Equipment Utilized During Treatment  Gait belt    Activity Tolerance  Patient tolerated treatment well    Behavior During Therapy  WFL for tasks assessed/performed       Past Medical History:  Diagnosis Date  . Abnormal Q waves on electrocardiogram   . Anemia   . Aortic atherosclerosis ([place])    "I could possibly have it, my grandmother had it"  . Cancer [facility]) 05/2011   Right upper Lung CA with partial Lobectomy.  . Celiac disease   . Celiac disease   . Dyspnea    with exertion  . Hyperlipidemia   . Hyperlipidemia   . Lung mass   . Meningioma ([place])   . Osteoporosis   . Personal history of chemotherapy   . Personal history of radiation therapy     Past Surgical History:  Procedure Laterality Date  . LUNG LOBECTOMY     right lung  . 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  Subjective Assessment - 07/18/18 1420    Subjective  Patient reports no falls or LOB since last session. Reports she gets out of breathe quickly lately.    Pertinent History  Patient is returning to outpatient physical therapy after closure for COVID. Due to patient's last session being in March, patient is being re-evaluated today.  PMH includes anemia, aortic atherosclerosis, cancer, celiac disease, dyspnea, HLD, meningioma, osteoporosis, history of chemo/radiation therapy, ventriculoperitoneal shunt (07/03/17).    Limitations  Lifting;Standing;Walking;House hold activities;Other (comment)    How long can you sit comfortably?  n/a    How long can you stand comfortably?  5 minutes    How long can you walk comfortably?  400 ft before fatigue/out of breathe.    Patient Stated Goals  Patient wants to be able to step up onto a curb without holding on    Currently in Pain?  No/denies        TREATMENT:  Nustep level 2 4 minutes with RPM>60 for cardiovascular challenge/support     Neuro Re-Ed  In // bars: All standing interventions require CGA due to instability and limited capacity for prolonged stability on LLE. Verbal cueing and encouragement for task progression/ body mechanics required.    -Airex pad: 3lb bar: forward bench press 10x, straight arm raise 10x , twists 10x    Coordination toe taps on hedgehogs with PT guiding LE and color. 2 minutes. SUE support       TherEx:   side step up and down 6" step each direction, BUE support and verbal cueing for foot placement for enough room for second foot 10x. CGA  6" step toe taps forward SUE support 12x each LE,   6" step toe taps laterally SUE support 12x each LE, more challenged with LLE  6" step step ups 10x each LE BUE support with fatigue and limited breathing with repetition    sit to stand from raised plinth table with ball x 10 trials.verbal and tactile cueing for foot placement and keeping knees apart.     Ambulation without AD in hallway 370f  feet with verbal cues for improving forward momentum and to promote L arm swing. CGA.    Modified squat sit to stands with hands on knees in // bars with Min A for forward trunk lean/momentum. 6x    Standing heel toe raises with BUE support 12x                   PT Education - 07/18/18 1420     Education provided  Yes    Education Details  exercise technique, stability    Person(s) Educated  Patient    Methods  Explanation;Demonstration;Tactile cues;Verbal cues    Comprehension  Verbalized understanding;Returned demonstration;Verbal cues required;Tactile cues required       PT Short Term Goals - 07/16/18 1304      PT SHORT TERM GOAL #1   Title  Patient will be independent in HEP demonstrating successful between session carryover and improve functional mobility and ease of completing ADLs.    Baseline  6/30: HEP given next session    Time  2    Period  Weeks    Status  New    Target Date  07/30/18      PT SHORT TERM GOAL #2   Title  Patient will be able to transfer sit<>Stand from regular height chair without pushing on arm rests to exhibit improved independence improved functional strength.     Baseline  6/30: requires excessive BUE support    Time  2    Period  Weeks    Status  New    Target Date  07/30/18        PT Long Term Goals - 07/16/18 1305      PT LONG TERM GOAL #1   Title  Patient will increase six minute walk test distance to >1000 for progression to community ambulator and improve gait ability    Baseline  6/30: 880 ft with rollator    Time  8    Period  Weeks    Status  New    Target Date  09/10/18      PT LONG TERM GOAL #2   Title  Patient (> 429years old) will complete five times sit to stand test in < 15 seconds without UE support indicating an increased LE strength and improved balance.    Baseline  6/30: 16 seconds with excessive BUE support    Time  8    Period  Weeks    Status  New    Target Date  09/10/18      PT LONG TERM GOAL #3   Title  Patient will demonstrate an improved Berg Balance Score of >42/56 as to demonstrate improved balance with ADLs such as sitting/standing and transfer balance and reduced fall risk.     Baseline  6/30: 37/56    Time  8    Period  Weeks    Status  New    Target Date  09/10/18      PT LONG TERM GOAL  #4   Title  Patient will improve LEFS to 54/80 to demonstrate improved fucnctional strenth and mobility and ease of indepedence    Baseline  6/30: 32/80    Time  8    Period  Weeks    Status  New    Target Date  09/10/18            Plan - 07/18/18 1540    Clinical Impression Statement  Patient is challenged with coordination and sequencing with LLE, she experiences a "floating" feeling when attempting to touch a target. Patient has limited stability on LLE and has a fear of LOB resulting in desire to hold with UE's for stability.Patient will benefit from skilled physical therapy to increase strength and stability for decreased fall risk and improved capacity for functional mobility.    Personal Factors and Comorbidities  Age;Comorbidity 3+;Fitness;Past/Current Experience;Social Background;Time since onset of injury/illness/exacerbation    Comorbidities  anemia, cancer, celiac disease, dyspnea, meningioma, osteoporosis, HLD    Examination-Activity Limitations  Bed Mobility;Bathing;Bend;Carry;Dressing;Hygiene/Grooming;Locomotion Level;Reach Overhead;Sit;Squat;Stairs;Stand;Toileting;Transfers    Examination-Participation Restrictions  Church;Cleaning;Community Activity;Driving;Interpersonal Relationship;Meal Prep;Laundry;Shop;Volunteer;Yard Work    [employer] officer  Evolving/Moderate complexity    Rehab Potential  Fair    PT Frequency  2x / week    PT Duration  8 weeks    PT Treatment/Interventions  ADLs/Self Care Home Management;Cryotherapy;Electrical Stimulation;Iontophoresis 67m/ml Dexamethasone;Moist Heat;Ultrasound;DME Instruction;Gait training;Stair training;Balance training;Therapeutic exercise;Therapeutic activities;Functional mobility training;Neuromuscular re-education;Patient/family education;Manual techniques;Passive range of motion;Energy conservation    PT Next Visit Plan  give new HEP    Consulted and Agree with Plan of Care  Patient       Patient will  benefit from skilled therapeutic intervention in order to improve the following deficits and impairments:  Abnormal gait, Decreased activity tolerance, Decreased balance, Decreased coordination, Decreased endurance, Decreased mobility, Decreased range of motion, Decreased safety awareness, Decreased strength, Difficulty walking, Impaired flexibility, Impaired perceived functional ability, Improper body mechanics, Postural dysfunction, Pain, Impaired UE functional use, Decreased knowledge of precautions, Cardiopulmonary status limiting activity, Decreased knowledge of use of DME  Visit Diagnosis: 1. Muscle weakness (generalized)   2. Unsteadiness on feet   3. Neurologic gait disorder        Problem List Patient Active Problem List   Diagnosis Date Noted  . Cognitive deficits   . Labile blood pressure   . Acute blood loss anemia   . Slow transit constipation   . Vascular headache   . 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 [place]cephal[place] ([place] 07/03/2017  . 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 Pelvic fracture ([place] 04/18/2016  . CD (celiac disease) 08/04/2014  . Cancer of [place] lobe of right lung ([place] 08/04/2014  . Age related osteoporosis 11/26/2013  . Absolute anemia 05/21/2013  . H/O malignant neoplasm 05/21/2013  . HLD (hyperlipidemia) 05/21/2013   MJanna Arch PT, DPT   07/18/2018, 3:41 PM  [place]MAIN [facility] [address] [address]Phone: [phone]  Fax:  [phone] Name: Krista CIRRINCIONEMRN: [id number] of Birth: [birth year]

## 2018-07-22 ENCOUNTER — Other Ambulatory Visit: Payer: Self-pay

## 2018-07-22 ENCOUNTER — Ambulatory Visit: Payer: PPO

## 2018-07-22 DIAGNOSIS — R269 Unspecified abnormalities of gait and mobility: Secondary | ICD-10-CM

## 2018-07-22 DIAGNOSIS — M6281 Muscle weakness (generalized): Secondary | ICD-10-CM

## 2018-07-22 DIAGNOSIS — R2681 Unsteadiness on feet: Secondary | ICD-10-CM

## 2018-07-22 NOTE — Therapy (Signed)
[place] MAIN [facility] SERVICES [address] [place], [place], [address] Phone: [phone]   Fax:  [phone]  Physical Therapy Treatment  Patient Details  Name: Krista Evans MRN: [id number] Date of Birth: October 31, 1945 Referring Provider (PT): Ramonita Lab   Encounter Date: 07/22/2018  PT End of Session - 07/22/18 0937    Visit Number  3    Number of Visits  16    Date for PT Re-Evaluation  09/10/18    Authorization Type  3/10 re eval starting 6/30    PT Start Time  0931    PT Stop Time  1015    PT Time Calculation (min)  44 min    Equipment Utilized During Treatment  Gait belt    Activity Tolerance  Patient tolerated treatment well    Behavior During Therapy  [facility] for tasks assessed/performed       Past Medical History:  Diagnosis Date  . Abnormal Q waves on electrocardiogram   . Anemia   . Aortic atherosclerosis ([place])    "I could possibly have it, my grandmother had it"  . Cancer [facility]) 05/2011   Right upper Lung CA with partial Lobectomy.  . Celiac disease   . Celiac disease   . Dyspnea    with exertion  . Hyperlipidemia   . Hyperlipidemia   . Lung mass   . Meningioma ([place])   . Osteoporosis   . Personal history of chemotherapy   . Personal history of radiation therapy     Past Surgical History:  Procedure Laterality Date  . LUNG LOBECTOMY     right lung  . VENTRICULOPERITONEAL SHUNT Right 07/03/2017   Procedure: SHUNT INSERTION VENTRICULAR-PERITONEAL;  Surgeon: Newman Pies, MD;  Location: [place];  Service: Neurosurgery;  Laterality: Right;    There were no vitals filed for this visit.  Subjective Assessment - 07/22/18 0936    Subjective  Patient reports she had a good fourth of july. Was able to spend time with her grandchildren. No falls or LOB since last session.    Pertinent History  Patient is returning to outpatient physical therapy after closure for COVID. Due to patient's last session being in March, patient  is being re-evaluated today. PMH includes anemia, aortic atherosclerosis, cancer, celiac disease, dyspnea, HLD, meningioma, osteoporosis, history of chemo/radiation therapy, ventriculoperitoneal shunt (07/03/17).    Limitations  Lifting;Standing;Walking;House hold activities;Other (comment)    How long can you sit comfortably?  n/a    How long can you stand comfortably?  5 minutes    How long can you walk comfortably?  400 ft before fatigue/out of breathe.    Patient Stated Goals  Patient wants to be able to step up onto a curb without holding on    Currently in Pain?  No/denies       TREATMENT:   Nustep level 2 4 minutes with RPM>60 for cardiovascular challenge/support     Neuro Re-Ed Ambulate in hallway with horizontal head turns and SUE support (single HHA) 3 episodes of near LOB     2" step seated toe taps for coordination/speed 30 seconds x 2 trials  TherEx:    Ambulation without AD in hallway 364f  feet with verbal cues for improving forward momentum and to promote L arm swing. CGA. Two near LOB    Modified squat sit to stands with hands on knees in // bars with Min A for forward trunk lean/momentum. 6x    Standing heel toe raises with  BUE support 12x  Seated LAQ kicking soccer ball with focus on coordination and spatial awareness x 3 minutes.   Seated LLE alphabet spelling, very challenging for patient.    Quantum leg press: BLE; 10x, 90 lb, 2nd set #105 10x ; cueing for keeping RLE neutral due to preference for adduction, cueing for slow velocity for maximal muscle contraction                        PT Education - 07/22/18 0937    Education provided  Yes    Education Details  exercise technique, stability    Person(s) Educated  Patient    Methods  Explanation;Demonstration;Tactile cues;Verbal cues    Comprehension  Verbalized understanding;Returned demonstration;Verbal cues required;Tactile cues required       PT Short Term Goals - 07/16/18 1304       PT SHORT TERM GOAL #1   Title  Patient will be independent in HEP demonstrating successful between session carryover and improve functional mobility and ease of completing ADLs.    Baseline  6/30: HEP given next session    Time  2    Period  Weeks    Status  New    Target Date  07/30/18      PT SHORT TERM GOAL #2   Title  Patient will be able to transfer sit<>Stand from regular height chair without pushing on arm rests to exhibit improved independence improved functional strength.     Baseline  6/30: requires excessive BUE support    Time  2    Period  Weeks    Status  New    Target Date  07/30/18        PT Long Term Goals - 07/16/18 1305      PT LONG TERM GOAL #1   Title  Patient will increase six minute walk test distance to >1000 for progression to community ambulator and improve gait ability    Baseline  6/30: 880 ft with rollator    Time  8    Period  Weeks    Status  New    Target Date  09/10/18      PT LONG TERM GOAL #2   Title  Patient (> 75 years old) will complete five times sit to stand test in < 15 seconds without UE support indicating an increased LE strength and improved balance.    Baseline  6/30: 16 seconds with excessive BUE support    Time  8    Period  Weeks    Status  New    Target Date  09/10/18      PT LONG TERM GOAL #3   Title  Patient will demonstrate an improved Berg Balance Score of >42/56 as to demonstrate improved balance with ADLs such as sitting/standing and transfer balance and reduced fall risk.     Baseline  6/30: 37/56    Time  8    Period  Weeks    Status  New    Target Date  09/10/18      PT LONG TERM GOAL #4   Title  Patient will improve LEFS to 54/80 to demonstrate improved fucnctional strenth and mobility and ease of indepedence    Baseline  6/30: 32/80    Time  8    Period  Weeks    Status  New    Target Date  09/10/18            Plan -  07/22/18 1014    Clinical Impression Statement  Patient presents to  physical therapy session with good motivation. She is challenged with prolonged muscle activation, fatiguing quickly and feeling "out of breathe". Her Sp02 remains within functional range and monitored throughout session. Patient continues to be challenged with LLE stability and coordination with limited spatial awareness. Patient will benefit from skilled physical therapy to increase strength and stability for decreased fall risk and improved capacity for functional mobility.    Personal Factors and Comorbidities  Age;Comorbidity 3+;Fitness;Past/Current Experience;Social Background;Time since onset of injury/illness/exacerbation    Comorbidities  anemia, cancer, celiac disease, dyspnea, meningioma, osteoporosis, HLD    Examination-Activity Limitations  Bed Mobility;Bathing;Bend;Carry;Dressing;Hygiene/Grooming;Locomotion Level;Reach Overhead;Sit;Squat;Stairs;Stand;Toileting;Transfers    Examination-Participation Restrictions  Church;Cleaning;Community Activity;Driving;Interpersonal Relationship;Meal Prep;Laundry;Shop;Volunteer;Yard Work    [employer] officer  Evolving/Moderate complexity    Rehab Potential  Fair    PT Frequency  2x / week    PT Duration  8 weeks    PT Treatment/Interventions  ADLs/Self Care Home Management;Cryotherapy;Electrical Stimulation;Iontophoresis 71m/ml Dexamethasone;Moist Heat;Ultrasound;DME Instruction;Gait training;Stair training;Balance training;Therapeutic exercise;Therapeutic activities;Functional mobility training;Neuromuscular re-education;Patient/family education;Manual techniques;Passive range of motion;Energy conservation    PT Next Visit Plan  give new HEP    Consulted and Agree with Plan of Care  Patient       Patient will benefit from skilled therapeutic intervention in order to improve the following deficits and impairments:  Abnormal gait, Decreased activity tolerance, Decreased balance, Decreased coordination, Decreased endurance, Decreased  mobility, Decreased range of motion, Decreased safety awareness, Decreased strength, Difficulty walking, Impaired flexibility, Impaired perceived functional ability, Improper body mechanics, Postural dysfunction, Pain, Impaired UE functional use, Decreased knowledge of precautions, Cardiopulmonary status limiting activity, Decreased knowledge of use of DME  Visit Diagnosis: 1. Muscle weakness (generalized)   2. Unsteadiness on feet   3. Neurologic gait disorder        Problem List Patient Active Problem List   Diagnosis Date Noted  . Cognitive deficits   . Labile blood pressure   . Acute blood loss anemia   . Slow transit constipation   . Vascular headache   . 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 [place]cephalus ([place] 07/03/2017  . 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 Pelvic fracture ([place] 04/18/2016  . CD (celiac disease) 08/04/2014  . Cancer of [place] of right lung ([place] 08/04/2014  . Age related osteoporosis 11/26/2013  . Absolute anemia 05/21/2013  . H/O malignant neoplasm 05/21/2013  . HLD (hyperlipidemia) 05/21/2013   MJanna Arch PT, DPT   07/22/2018, 10:19 AM  [place]MAIN RSh[facility] IncSERVICES [address] [address]Phone: [phone]  Fax:  [phone] Name: Krista LOTTMANMRN: [id number] of Birth: 3Oct 17, 1947

## 2018-07-24 ENCOUNTER — Other Ambulatory Visit: Payer: Self-pay

## 2018-07-24 ENCOUNTER — Ambulatory Visit: Payer: PPO

## 2018-07-24 DIAGNOSIS — M6281 Muscle weakness (generalized): Secondary | ICD-10-CM

## 2018-07-24 DIAGNOSIS — R2681 Unsteadiness on feet: Secondary | ICD-10-CM

## 2018-07-24 DIAGNOSIS — R269 Unspecified abnormalities of gait and mobility: Secondary | ICD-10-CM

## 2018-07-24 NOTE — Therapy (Signed)
[place] MAIN [facility] SERVICES [address] [place], [place], [address] Phone: [phone]   Fax:  [phone]  Physical Therapy Treatment  Patient Details  Name: Krista Evans MRN: [id number] Date of Birth: [birth year] Referring Provider (PT): Ramonita Lab   Encounter Date: 07/24/2018  PT End of Session - 07/24/18 0956    Visit Number  4    Number of Visits  16    Date for PT Re-Evaluation  09/10/18    Authorization Type  4/10 re eval starting 6/30    PT Start Time  0933    PT Stop Time  1019    PT Time Calculation (min)  46 min    Equipment Utilized During Treatment  Gait belt    Activity Tolerance  Patient tolerated treatment well    Behavior During Therapy  [facility] for tasks assessed/performed       Past Medical History:  Diagnosis Date  . Abnormal Q waves on electrocardiogram   . Anemia   . Aortic atherosclerosis ([place])    "I could possibly have it, my grandmother had it"  . Cancer [facility]) 05/2011   Right upper Lung CA with partial Lobectomy.  . Celiac disease   . Celiac disease   . Dyspnea    with exertion  . Hyperlipidemia   . Hyperlipidemia   . Lung mass   . Meningioma ([place])   . Osteoporosis   . Personal history of chemotherapy   . Personal history of radiation therapy     Past Surgical History:  Procedure Laterality Date  . LUNG LOBECTOMY     right lung  . 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  Subjective Assessment - 07/24/18 0937    Subjective  Patient reports she is a little sleepy due to the early time but doing well. Has been doing her HEP daily. No falls or LOB since last session.    Pertinent History  Patient is returning to outpatient physical therapy after closure for COVID. Due to patient's last session being in  March, patient is being re-evaluated today. PMH includes anemia, aortic atherosclerosis, cancer, celiac disease, dyspnea, HLD, meningioma, osteoporosis, history of chemo/radiation therapy, ventriculoperitoneal shunt (07/03/17).    Limitations  Lifting;Standing;Walking;House hold activities;Other (comment)    How long can you sit comfortably?  n/a    How long can you stand comfortably?  5 minutes    How long can you walk comfortably?  400 ft before fatigue/out of breathe.    Patient Stated Goals  Patient wants to be able to step up onto a curb without holding on    Currently in Pain?  No/denies           TREATMENT:   Nustep level 2 4 minutes with RPM>60 for cardiovascular challenge/support      Neuro Re-Ed Ambulate in hallway with horizontal head turns and SUE support (single HHA) 3 episodes of near LOB, 100 ft each attempt.     2" step seated toe taps for coordination/speed 30 seconds x 2 trials   Standing balloon taps inside and outside BOS 3 minutes, able to use LLE occasionally  TherEx:    Ambulation without AD in hallway 41f  feet with verbal cues for improving forward momentum and to promote L arm swing. CGA. two seated rest breaks  Modified squat sit to stands with hands on knees in // bars with Min A for forward trunk lean/momentum. 6x    Step over and back orange hurdle 10x each LE, SUE support   Side step over and back orange hurdle; 12x each LE, finger tip support, max cueing for widening step length to make room for other foot.   RTB side stepping 4x length of // bars   2.5 ankle weight:  Hip extension 10x each LE; BUE support   Hip flexion 10x each LE marches; BUE support   Quantum leg press: BLE; 15x, 90 lb, 2nd set #105 15x ; cueing for keeping RLE neutral due to preference for adduction, cueing for slow velocity for maximal muscle contraction    Pt educated throughout session about proper posture and technique with exercises. Improved exercise technique,  movement at target joints, use of target muscles after min to mod verbal, visual, tactile cues.                    PT Education - 07/24/18 [phone]    Education provided  Yes    Education Details  exercise technique, stability    Person(s) Educated  Patient    Methods  Explanation;Demonstration;Tactile cues;Verbal cues    Comprehension  Verbalized understanding;Returned demonstration;Verbal cues required;Tactile cues required       PT Short Term Goals - 07/16/18 1304      PT SHORT TERM GOAL #1   Title  Patient will be independent in HEP demonstrating successful between session carryover and improve functional mobility and ease of completing ADLs.    Baseline  6/30: HEP given next session    Time  2    Period  Weeks    Status  New    Target Date  07/30/18      PT SHORT TERM GOAL #2   Title  Patient will be able to transfer sit<>Stand from regular height chair without pushing on arm rests to exhibit improved independence improved functional strength.     Baseline  6/30: requires excessive BUE support    Time  2    Period  Weeks    Status  New    Target Date  07/30/18        PT Long Term Goals - 07/16/18 1305      PT LONG TERM GOAL #1   Title  Patient will increase six minute walk test distance to >1000 for progression to community ambulator and improve gait ability    Baseline  6/30: 880 ft with rollator    Time  8    Period  Weeks    Status  New    Target Date  09/10/18      PT LONG TERM GOAL #2   Title  Patient (> 75 years old) will complete five times sit to stand test in < 15 seconds without UE support indicating an increased LE strength and improved balance.    Baseline  6/30: 16 seconds with excessive BUE support    Time  8    Period  Weeks    Status  New    Target Date  09/10/18      PT LONG TERM GOAL #3   Title  Patient will demonstrate an improved Berg Balance Score of >42/56 as to demonstrate improved balance with ADLs such as sitting/standing  and transfer balance and reduced fall risk.     Baseline  6/30: 37/56    Time  8  Period  Weeks    Status  New    Target Date  09/10/18      PT LONG TERM GOAL #4   Title  Patient will improve LEFS to 54/80 to demonstrate improved fucnctional strenth and mobility and ease of indepedence    Baseline  6/30: 32/80    Time  8    Period  Weeks    Status  New    Target Date  09/10/18            Plan - 07/24/18 1023    Clinical Impression Statement  Patient is progressing with functional capacity for mobility with prolonged standing activities. Patient was able to increase repetitions of leg press as well as increase distance of ambulation with an extra seated rest break. She continues to be fearful of any dynamic stability without UE support requiring max encouragement for performance.  Patient will benefit from skilled physical therapy to increase strength and stability for decreased fall risk and improved capacity for functional mobility.    Personal Factors and Comorbidities  Age;Comorbidity 3+;Fitness;Past/Current Experience;Social Background;Time since onset of injury/illness/exacerbation    Comorbidities  anemia, cancer, celiac disease, dyspnea, meningioma, osteoporosis, HLD    Examination-Activity Limitations  Bed Mobility;Bathing;Bend;Carry;Dressing;Hygiene/Grooming;Locomotion Level;Reach Overhead;Sit;Squat;Stairs;Stand;Toileting;Transfers    Examination-Participation Restrictions  Church;Cleaning;Community Activity;Driving;Interpersonal Relationship;Meal Prep;Laundry;Shop;Volunteer;Yard Work    [employer] officer  Evolving/Moderate complexity    Rehab Potential  Fair    PT Frequency  2x / week    PT Duration  8 weeks    PT Treatment/Interventions  ADLs/Self Care Home Management;Cryotherapy;Electrical Stimulation;Iontophoresis 51m/ml Dexamethasone;Moist Heat;Ultrasound;DME Instruction;Gait training;Stair training;Balance training;Therapeutic exercise;Therapeutic  activities;Functional mobility training;Neuromuscular re-education;Patient/family education;Manual techniques;Passive range of motion;Energy conservation    PT Next Visit Plan  give new HEP    Consulted and Agree with Plan of Care  Patient       Patient will benefit from skilled therapeutic intervention in order to improve the following deficits and impairments:  Abnormal gait, Decreased activity tolerance, Decreased balance, Decreased coordination, Decreased endurance, Decreased mobility, Decreased range of motion, Decreased safety awareness, Decreased strength, Difficulty walking, Impaired flexibility, Impaired perceived functional ability, Improper body mechanics, Postural dysfunction, Pain, Impaired UE functional use, Decreased knowledge of precautions, Cardiopulmonary status limiting activity, Decreased knowledge of use of DME  Visit Diagnosis: 1. Muscle weakness (generalized)   2. Unsteadiness on feet   3. Neurologic gait disorder        Problem List Patient Active Problem List   Diagnosis Date Noted  . Cognitive deficits   . Labile blood pressure   . Acute blood loss anemia   . Slow transit constipation   . Vascular headache   . 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 Hydrocephalus ([place] 07/03/2017  . 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 Pelvic fracture ([place] 04/18/2016  . CD (celiac disease) 08/04/2014  . Cancer of [place] of right lung [place] 08/04/2014  . Age related osteoporosis 11/26/2013  . Absolute anemia 05/21/2013  . H/O malignant neoplasm 05/21/2013  . HLD (hyperlipidemia) 05/21/2013   MJanna Arch PT, DPT   07/24/2018, 10:25 AM  [place]MAIN [facility]SERV[facility] [address]RFlippin [place] [address]Phone: [phone]  Fax:  [phone] Name: Krista SLINGERLANDMRN: [id number] of Birth: 3Dec 06, 1947

## 2018-07-29 ENCOUNTER — Other Ambulatory Visit: Payer: Self-pay

## 2018-07-29 ENCOUNTER — Ambulatory Visit: Payer: PPO

## 2018-07-29 DIAGNOSIS — R269 Unspecified abnormalities of gait and mobility: Secondary | ICD-10-CM

## 2018-07-29 DIAGNOSIS — M6281 Muscle weakness (generalized): Secondary | ICD-10-CM

## 2018-07-29 DIAGNOSIS — R2681 Unsteadiness on feet: Secondary | ICD-10-CM

## 2018-07-29 NOTE — Therapy (Signed)
[place] MAIN [facility] SERVICES [address] [place], [place], [address] Phone: [phone]   Fax:  [phone]  Physical Therapy Treatment  Patient Details  Name: Krista Evans MRN: [id number] Date of Birth: 1945-05-25 Referring Provider (PT): Ramonita Lab   Encounter Date: 07/29/2018  PT End of Session - 07/29/18 1010    Visit Number  5    Number of Visits  16    Date for PT Re-Evaluation  09/10/18    Authorization Type  5/10 re eval starting 6/30    PT Start Time  0942    PT Stop Time  1028    PT Time Calculation (min)  46 min    Equipment Utilized During Treatment  Gait belt    Activity Tolerance  Patient tolerated treatment well    Behavior During Therapy  WFL for tasks assessed/performed       Past Medical History:  Diagnosis Date  . Abnormal Q waves on electrocardiogram   . Anemia   . Aortic atherosclerosis ([place])    "I could possibly have it, my grandmother had it"  . Cancer [facility]) 05/2011   Right upper Lung CA with partial Lobectomy.  . Celiac disease   . Celiac disease   . Dyspnea    with exertion  . Hyperlipidemia   . Hyperlipidemia   . Lung mass   . Meningioma ([place])   . Osteoporosis   . Personal history of chemotherapy   . Personal history of radiation therapy     Past Surgical History:  Procedure Laterality Date  . LUNG LOBECTOMY     right lung  . 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  Subjective Assessment - 07/29/18 0945    Subjective  Patient reports no falls or LOB since last session. Has been compliant with HEP.    Pertinent History  Patient is returning to outpatient physical therapy after closure for COVID. Due to patient's last session being in March, patient is being re-evaluated today. PMH includes anemia,  aortic atherosclerosis, cancer, celiac disease, dyspnea, HLD, meningioma, osteoporosis, history of chemo/radiation therapy, ventriculoperitoneal shunt (07/03/17).    Limitations  Lifting;Standing;Walking;House hold activities;Other (comment)    How long can you sit comfortably?  n/a    How long can you stand comfortably?  5 minutes    How long can you walk comfortably?  400 ft before fatigue/out of breathe.    Patient Stated Goals  Patient wants to be able to step up onto a curb without holding on    Currently in Pain?  No/denies          Nustep level 2 4 minutes with RPM>60 for cardiovascular challenge/support      TherEx:    Ambulation without AD in hallway 470f  feet with verbal cues for improving forward momentum and to promote L arm swing. CGA. two seated rest breaks   Modified squat sit to stands with hands on knees in // bars with Min A for forward trunk lean/momentum. 6x    Quantum leg press: BLE; 15x, 105 lb, 2nd set #120 10x ; cueing for keeping RLE neutral due to preference for adduction, cueing for slow velocity for maximal muscle contraction   Quantum leg press: Single LE: #45 lb 15x 2 sets each LE   Stair negotiation: ascending: BUE  support, cueing to lead with right for step to pattern, descending: BUE support cueing to lead with left for step to pattern: "up with the good, down with the bad"  New HEP.   Access Code: [email or web address]  URL: [email or web address]  Date: 07/29/2018  Prepared by: Janna Arch   Exercises Standing March with Counter Support - 10 reps - 2 sets - 5 hold - 1x daily - 7x weekly Standing Hip Extension with Counter Support - 10 reps - 2 sets - 5 hold - 1x daily - 7x weekly Standing Hip Abduction with Counter Support - 10 reps - 2 sets - 5 hold - 1x daily - 7x weekly Sit to Stand with Hands on Knees - 10 reps - 2 sets - 5 hold - 1x daily - 7x weekly     Pt educated throughout session about proper posture and technique with exercises.  Improved exercise technique, movement at target joints, use of target muscles after min to mod verbal, visual, tactile cues.                 PT Education - 07/29/18 0946    Education provided  Yes    Education Details  exercise technique, stability    Person(s) Educated  Patient    Methods  Explanation;Demonstration;Tactile cues;Verbal cues    Comprehension  Verbalized understanding;Returned demonstration;Verbal cues required;Tactile cues required       PT Short Term Goals - 07/16/18 1304      PT SHORT TERM GOAL #1   Title  Patient will be independent in HEP demonstrating successful between session carryover and improve functional mobility and ease of completing ADLs.    Baseline  6/30: HEP given next session    Time  2    Period  Weeks    Status  New    Target Date  07/30/18      PT SHORT TERM GOAL #2   Title  Patient will be able to transfer sit<>Stand from regular height chair without pushing on arm rests to exhibit improved independence improved functional strength.     Baseline  6/30: requires excessive BUE support    Time  2    Period  Weeks    Status  New    Target Date  07/30/18        PT Long Term Goals - 07/16/18 1305      PT LONG TERM GOAL #1   Title  Patient will increase six minute walk test distance to >1000 for progression to community ambulator and improve gait ability    Baseline  6/30: 880 ft with rollator    Time  8    Period  Weeks    Status  New    Target Date  09/10/18      PT LONG TERM GOAL #2   Title  Patient (> 63 years old) will complete five times sit to stand test in < 15 seconds without UE support indicating an increased LE strength and improved balance.    Baseline  6/30: 16 seconds with excessive BUE support    Time  8    Period  Weeks    Status  New    Target Date  09/10/18      PT LONG TERM GOAL #3   Title  Patient will demonstrate an improved Berg Balance Score of >42/56 as to demonstrate improved balance with ADLs  such as sitting/standing and transfer balance and reduced fall risk.     Baseline  6/30:  37/56    Time  8    Period  Weeks    Status  New    Target Date  09/10/18      PT LONG TERM GOAL #4   Title  Patient will improve LEFS to 54/80 to demonstrate improved fucnctional strenth and mobility and ease of indepedence    Baseline  6/30: 32/80    Time  8    Period  Weeks    Status  New    Target Date  09/10/18            Plan - 07/29/18 1013    Clinical Impression Statement  Patient is progressing with functional strength and stability with progression of Hep performed. Patient is challenged with prolonged mobility requiring rest breaks with repeated muscle activation. Functional strength of LLE is progressing with increasing leg press weight and ability to perform single limb leg press. Patient will benefit from skilled physical therapy to increase strength and stability for decreased fall risk and improved capacity for functional mobility.    Personal Factors and Comorbidities  Age;Comorbidity 3+;Fitness;Past/Current Experience;Social Background;Time since onset of injury/illness/exacerbation    Comorbidities  anemia, cancer, celiac disease, dyspnea, meningioma, osteoporosis, HLD    Examination-Activity Limitations  Bed Mobility;Bathing;Bend;Carry;Dressing;Hygiene/Grooming;Locomotion Level;Reach Overhead;Sit;Squat;Stairs;Stand;Toileting;Transfers    Examination-Participation Restrictions  Church;Cleaning;Community Activity;Driving;Interpersonal Relationship;Meal Prep;Laundry;Shop;Volunteer;Yard Work    [employer] officer  Evolving/Moderate complexity    Rehab Potential  Fair    PT Frequency  2x / week    PT Duration  8 weeks    PT Treatment/Interventions  ADLs/Self Care Home Management;Cryotherapy;Electrical Stimulation;Iontophoresis 36m/ml Dexamethasone;Moist Heat;Ultrasound;DME Instruction;Gait training;Stair training;Balance training;Therapeutic exercise;Therapeutic  activities;Functional mobility training;Neuromuscular re-education;Patient/family education;Manual techniques;Passive range of motion;Energy conservation    PT Next Visit Plan  give new HEP    Consulted and Agree with Plan of Care  Patient       Patient will benefit from skilled therapeutic intervention in order to improve the following deficits and impairments:  Abnormal gait, Decreased activity tolerance, Decreased balance, Decreased coordination, Decreased endurance, Decreased mobility, Decreased range of motion, Decreased safety awareness, Decreased strength, Difficulty walking, Impaired flexibility, Impaired perceived functional ability, Improper body mechanics, Postural dysfunction, Pain, Impaired UE functional use, Decreased knowledge of precautions, Cardiopulmonary status limiting activity, Decreased knowledge of use of DME  Visit Diagnosis: 1. Muscle weakness (generalized)   2. Unsteadiness on feet   3. Neurologic gait disorder        Problem List Patient Active Problem List   Diagnosis Date Noted  . Cognitive deficits   . Labile blood pressure   . Acute blood loss anemia   . Slow transit constipation   . Vascular headache   . 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 Hydrocephalus ([place] 07/03/2017  . 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 Pelvic fracture ([place] 04/18/2016  . CD (celiac disease) 08/04/2014  . Cancer of [place] lobe of right lung ([place] 08/04/2014  . Age related osteoporosis 11/26/2013  . Absolute anemia 05/21/2013  . H/O malignant neoplasm 05/21/2013  . HLD (hyperlipidemia) 05/21/2013   MJanna Arch PT, DPT   07/29/2018, 10:30 AM  CLely ResortMAIN RCommunity HospitalSERVICES [address] [place] [address]Phone: [phone]  Fax:  [phone] Name: MJACIA SICKMANMRN: [id number] of Birth: [birth year]

## 2018-07-31 ENCOUNTER — Other Ambulatory Visit: Payer: Self-pay

## 2018-07-31 ENCOUNTER — Ambulatory Visit: Payer: PPO

## 2018-07-31 DIAGNOSIS — M6281 Muscle weakness (generalized): Secondary | ICD-10-CM | POA: Diagnosis not present

## 2018-07-31 DIAGNOSIS — R2681 Unsteadiness on feet: Secondary | ICD-10-CM

## 2018-07-31 DIAGNOSIS — R269 Unspecified abnormalities of gait and mobility: Secondary | ICD-10-CM

## 2018-07-31 NOTE — Therapy (Signed)
[place] MAIN [facility] SERVICES [address] [place], [place], [address] Phone: [phone]   Fax:  [phone]  Physical Therapy Treatment  Patient Details  Name: Krista Evans MRN: [id number] Date of Birth: 18-Aug-1945 Referring Provider (PT): Ramonita Lab   Encounter Date: 07/31/2018  PT End of Session - 07/31/18 1002    Visit Number  6    Number of Visits  16    Date for PT Re-Evaluation  09/10/18    Authorization Type  6/10 re eval starting 6/30    PT Start Time  0945    PT Stop Time  1029    PT Time Calculation (min)  44 min    Equipment Utilized During Treatment  Gait belt    Activity Tolerance  Patient tolerated treatment well    Behavior During Therapy  WFL for tasks assessed/performed       Past Medical History:  Diagnosis Date  . Abnormal Q waves on electrocardiogram   . Anemia   . Aortic atherosclerosis ([place])    "I could possibly have it, my grandmother had it"  . Cancer [facility]) 05/2011   Right upper Lung CA with partial Lobectomy.  . Celiac disease   . Celiac disease   . Dyspnea    with exertion  . Hyperlipidemia   . Hyperlipidemia   . Lung mass   . Meningioma ([place])   . Osteoporosis   . Personal history of chemotherapy   . Personal history of radiation therapy     Past Surgical History:  Procedure Laterality Date  . LUNG LOBECTOMY     right lung  . 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  Subjective Assessment - 07/31/18 0947    Subjective  Patient reports compliance with HEP, performing twice a day. No falls or stumbles since last session.    Pertinent History  Patient is returning to outpatient physical therapy after closure for COVID. Due to patient's last session being in March, patient is being re-evaluated today.  PMH includes anemia, aortic atherosclerosis, cancer, celiac disease, dyspnea, HLD, meningioma, osteoporosis, history of chemo/radiation therapy, ventriculoperitoneal shunt (07/03/17).    Limitations  Lifting;Standing;Walking;House hold activities;Other (comment)    How long can you sit comfortably?  n/a    How long can you stand comfortably?  5 minutes    How long can you walk comfortably?  400 ft before fatigue/out of breathe.    Patient Stated Goals  Patient wants to be able to step up onto a curb without holding on    Currently in Pain?  No/denies           Neuro Re-Ed   2" step seated toe taps for coordination/speed 30 seconds x 3 trials    Standing balloon taps inside and outside BOS 3 minutes, able to use LLE occasionally   TherEx:    Ambulation without AD in hallway >1000  feet with verbal cues for improving forward momentum and to promote L arm swing. CGA. three standing rest breaks. Patient has near LOB when turning corners requiring CGA-MinA to retain COM.    Seated adduction squeezes 10x 3 second holds; cueing for L leg squeeze prior to R for optimal activation.   Sit to stand with minimal UE support, cueing for shifting to front of chair, forward weight shift,  and use of BLE's. 5x   Quantum leg press: BLE;  #105 15x ; cueing for keeping RLE neutral due to preference for adduction, cueing for slow velocity for maximal muscle contraction ; 2 sets    Quantum leg press: SLE : #45 each LE, 15x each, cueing for keeping RLE neutral; 2 sets   Pt educated throughout session about proper posture and technique with exercises. Improved exercise technique, movement at target joints, use of target muscles after min to mod verbal, visual, tactile cues.                          PT Education - 07/31/18 0959    Education provided  Yes    Education Details  exercise tchnique, stability    Person(s) Educated  Patient    Methods  Explanation;Demonstration;Tactile  cues;Verbal cues    Comprehension  Verbalized understanding;Returned demonstration;Verbal cues required;Tactile cues required       PT Short Term Goals - 07/16/18 1304      PT SHORT TERM GOAL #1   Title  Patient will be independent in HEP demonstrating successful between session carryover and improve functional mobility and ease of completing ADLs.    Baseline  6/30: HEP given next session    Time  2    Period  Weeks    Status  New    Target Date  07/30/18      PT SHORT TERM GOAL #2   Title  Patient will be able to transfer sit<>Stand from regular height chair without pushing on arm rests to exhibit improved independence improved functional strength.     Baseline  6/30: requires excessive BUE support    Time  2    Period  Weeks    Status  New    Target Date  07/30/18        PT Long Term Goals - 07/16/18 1305      PT LONG TERM GOAL #1   Title  Patient will increase six minute walk test distance to >1000 for progression to community ambulator and improve gait ability    Baseline  6/30: 880 ft with rollator    Time  8    Period  Weeks    Status  New    Target Date  09/10/18      PT LONG TERM GOAL #2   Title  Patient (> 51 years old) will complete five times sit to stand test in < 15 seconds without UE support indicating an increased LE strength and improved balance.    Baseline  6/30: 16 seconds with excessive BUE support    Time  8    Period  Weeks    Status  New    Target Date  09/10/18      PT LONG TERM GOAL #3   Title  Patient will demonstrate an improved Berg Balance Score of >42/56 as to demonstrate improved balance with ADLs such as sitting/standing and transfer balance and reduced fall risk.     Baseline  6/30: 37/56    Time  8    Period  Weeks    Status  New    Target Date  09/10/18      PT LONG TERM GOAL #4   Title  Patient will improve LEFS to 54/80 to demonstrate improved fucnctional strenth and mobility and ease of indepedence    Baseline  6/30: 32/80     Time  8  Period  Weeks    Status  New    Target Date  09/10/18            Plan - 07/31/18 1003    Clinical Impression Statement  Patient progressing with ambulatory capacity, ambulating >1000 ft with a few standing rest breaks to "catch her breath". Patient is limited in stability with obstacles, changing directions, and dual task requiring CGA -Min A to avoid LOB. Patient will benefit from skilled physical therapy to increase strength and stability for decreased fall risk and improved capacity for functional mobility.    Personal Factors and Comorbidities  Age;Comorbidity 3+;Fitness;Past/Current Experience;Social Background;Time since onset of injury/illness/exacerbation    Comorbidities  anemia, cancer, celiac disease, dyspnea, meningioma, osteoporosis, HLD    Examination-Activity Limitations  Bed Mobility;Bathing;Bend;Carry;Dressing;Hygiene/Grooming;Locomotion Level;Reach Overhead;Sit;Squat;Stairs;Stand;Toileting;Transfers    Examination-Participation Restrictions  Church;Cleaning;Community Activity;Driving;Interpersonal Relationship;Meal Prep;Laundry;Shop;Volunteer;Yard Work    [employer] officer  Evolving/Moderate complexity    Rehab Potential  Fair    PT Frequency  2x / week    PT Duration  8 weeks    PT Treatment/Interventions  ADLs/Self Care Home Management;Cryotherapy;Electrical Stimulation;Iontophoresis 62m/ml Dexamethasone;Moist Heat;Ultrasound;DME Instruction;Gait training;Stair training;Balance training;Therapeutic exercise;Therapeutic activities;Functional mobility training;Neuromuscular re-education;Patient/family education;Manual techniques;Passive range of motion;Energy conservation    PT Next Visit Plan  give new HEP    Consulted and Agree with Plan of Care  Patient       Patient will benefit from skilled therapeutic intervention in order to improve the following deficits and impairments:  Abnormal gait, Decreased activity tolerance, Decreased  balance, Decreased coordination, Decreased endurance, Decreased mobility, Decreased range of motion, Decreased safety awareness, Decreased strength, Difficulty walking, Impaired flexibility, Impaired perceived functional ability, Improper body mechanics, Postural dysfunction, Pain, Impaired UE functional use, Decreased knowledge of precautions, Cardiopulmonary status limiting activity, Decreased knowledge of use of DME  Visit Diagnosis: 1. Muscle weakness (generalized)   2. Unsteadiness on feet   3. Neurologic gait disorder        Problem List Patient Active Problem List   Diagnosis Date Noted  . Cognitive deficits   . Labile blood pressure   . Acute blood loss anemia   . Slow transit constipation   . Vascular headache   . 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 [place] ([place] 07/03/2017  . 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 Pelvic fracture ([place] 04/18/2016  . CD (celiac disease) 08/04/2014  . Cancer of [place] lobe of right lung ([place] 08/04/2014  . Age related osteoporosis 11/26/2013  . Absolute anemia 05/21/2013  . H/O malignant neoplasm 05/21/2013  . HLD (hyperlipidemia) 05/21/2013   MJanna Arch PT, DPT   07/31/2018, 10:30 AM  [place] [facility] SurgerySERVICES [address] [place] [address]Phone: [phone]  Fax:  [phone] Name: Krista CORRIHERMRN: [id number] of Birth: [birth year]

## 2018-08-05 ENCOUNTER — Ambulatory Visit: Payer: PPO

## 2018-08-05 ENCOUNTER — Other Ambulatory Visit: Payer: Self-pay

## 2018-08-05 DIAGNOSIS — R269 Unspecified abnormalities of gait and mobility: Secondary | ICD-10-CM

## 2018-08-05 DIAGNOSIS — R2681 Unsteadiness on feet: Secondary | ICD-10-CM

## 2018-08-05 DIAGNOSIS — M6281 Muscle weakness (generalized): Secondary | ICD-10-CM | POA: Diagnosis not present

## 2018-08-05 NOTE — Therapy (Signed)
[place] MAIN [facility] SERVICES [address] [place], [place], [address] Phone: [phone]   Fax:  [phone]  Physical Therapy Treatment  Patient Details  Name: Krista Evans MRN: [id number] Date of Birth: 06-Dec-1945 Referring Provider (PT): Ramonita Lab   Encounter Date: 08/05/2018  PT End of Session - 08/05/18 0948    Visit Number  7    Number of Visits  16    Date for PT Re-Evaluation  09/10/18    Authorization Type  7/10 re eval starting 6/30    PT Start Time  0945    PT Stop Time  1029    PT Time Calculation (min)  44 min    Equipment Utilized During Treatment  Gait belt    Activity Tolerance  Patient tolerated treatment well    Behavior During Therapy  WFL for tasks assessed/performed       Past Medical History:  Diagnosis Date  . Abnormal Q waves on electrocardiogram   . Anemia   . Aortic atherosclerosis ([place])    "I could possibly have it, my grandmother had it"  . Cancer [facility]) 05/2011   Right upper Lung CA with partial Lobectomy.  . Celiac disease   . Celiac disease   . Dyspnea    with exertion  . Hyperlipidemia   . Hyperlipidemia   . Lung mass   . Meningioma ([place])   . Osteoporosis   . Personal history of chemotherapy   . Personal history of radiation therapy     Past Surgical History:  Procedure Laterality Date  . LUNG LOBECTOMY     right lung  . 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  Subjective Assessment - 08/05/18 0947    Subjective  Patient reports no falls or LOB since last session. Has been compliant with HEP.    Pertinent History  Patient is returning to outpatient physical therapy after closure for COVID. Due to patient's last session being in March, patient is being re-evaluated today. PMH includes anemia,  aortic atherosclerosis, cancer, celiac disease, dyspnea, HLD, meningioma, osteoporosis, history of chemo/radiation therapy, ventriculoperitoneal shunt (07/03/17).    Limitations  Lifting;Standing;Walking;House hold activities;Other (comment)    How long can you sit comfortably?  n/a    How long can you stand comfortably?  5 minutes    How long can you walk comfortably?  400 ft before fatigue/out of breathe.    Patient Stated Goals  Patient wants to be able to step up onto a curb without holding on    Currently in Pain?  No/denies          TREATMENT:   Nustep level 3 4 minutes with RPM>60 for cardiovascular challenge/support      Neuro Re-Ed Ambulate in hallway with horizontal head turns and SUE support (single HHA) 3 episodes of near LOB   Ambulate in hallway with red light/green light for initiation/termination of ambulation with SUE support (single HHA) frequent near LOB when stopping ambulation requiring assistance to maintain stability.     2" step seated toe taps for coordination/speed 30 seconds x 2 trials   6" step toe taps with SUE support and CGA , max cueing for sequencing due to fear of LOB 15x each LOB  6" lateral toe taps with UE support and CGA   Seated alphabet  LLE for coordination and sequencing for coordination and positional awareness.   TherEx:    Ambulation without AD in hallway 31f  feet with verbal cues for improving forward momentum and to promote L arm swing. CGA. Two near LOB    Modified squat with hands on bar for proper gluteal activation Min A for forward trunk lean/momentum. 10x    RTB ankle df 20x each LE   Standing heel toe raises with BUE support 12x   Sit to stand hands on knees CGA 5x                    PT Education - 08/05/18 0947    Education provided  Yes    Education Details  exercise technique, stability    Person(s) Educated  Patient    Methods  Explanation;Demonstration;Tactile cues;Verbal cues    Comprehension   Verbalized understanding;Returned demonstration;Verbal cues required;Tactile cues required       PT Short Term Goals - 07/16/18 1304      PT SHORT TERM GOAL #1   Title  Patient will be independent in HEP demonstrating successful between session carryover and improve functional mobility and ease of completing ADLs.    Baseline  6/30: HEP given next session    Time  2    Period  Weeks    Status  New    Target Date  07/30/18      PT SHORT TERM GOAL #2   Title  Patient will be able to transfer sit<>Stand from regular height chair without pushing on arm rests to exhibit improved independence improved functional strength.     Baseline  6/30: requires excessive BUE support    Time  2    Period  Weeks    Status  New    Target Date  07/30/18        PT Long Term Goals - 07/16/18 1305      PT LONG TERM GOAL #1   Title  Patient will increase six minute walk test distance to >1000 for progression to community ambulator and improve gait ability    Baseline  6/30: 880 ft with rollator    Time  8    Period  Weeks    Status  New    Target Date  09/10/18      PT LONG TERM GOAL #2   Title  Patient (> 89years old) will complete five times sit to stand test in < 15 seconds without UE support indicating an increased LE strength and improved balance.    Baseline  6/30: 16 seconds with excessive BUE support    Time  8    Period  Weeks    Status  New    Target Date  09/10/18      PT LONG TERM GOAL #3   Title  Patient will demonstrate an improved Berg Balance Score of >42/56 as to demonstrate improved balance with ADLs such as sitting/standing and transfer balance and reduced fall risk.     Baseline  6/30: 37/56    Time  8    Period  Weeks    Status  New    Target Date  09/10/18      PT LONG TERM GOAL #4   Title  Patient will improve LEFS to 54/80 to demonstrate improved fucnctional strenth and mobility and ease of indepedence    Baseline  6/30: 32/80    Time  8    Period  Weeks  Status  New    Target Date  09/10/18            Plan - 08/05/18 1006    Clinical Impression Statement  Patient challenged with stability in singe limb stance due to fear of LOB. Patient requires encouragement and CGA to challenge stability. Min A to avoid LOB. Patient has difficulty terminating ambulation with limited ankle reaction resulting in trunk reactions.  Patient will benefit from skilled physical therapy to increase strength and stability for decreased fall risk and improved capacity for functional mobility.    Personal Factors and Comorbidities  Age;Comorbidity 3+;Fitness;Past/Current Experience;Social Background;Time since onset of injury/illness/exacerbation    Comorbidities  anemia, cancer, celiac disease, dyspnea, meningioma, osteoporosis, HLD    Examination-Activity Limitations  Bed Mobility;Bathing;Bend;Carry;Dressing;Hygiene/Grooming;Locomotion Level;Reach Overhead;Sit;Squat;Stairs;Stand;Toileting;Transfers    Examination-Participation Restrictions  Church;Cleaning;Community Activity;Driving;Interpersonal Relationship;Meal Prep;Laundry;Shop;Volunteer;Yard Work    [employer] officer  Evolving/Moderate complexity    Rehab Potential  Fair    PT Frequency  2x / week    PT Duration  8 weeks    PT Treatment/Interventions  ADLs/Self Care Home Management;Cryotherapy;Electrical Stimulation;Iontophoresis 28m/ml Dexamethasone;Moist Heat;Ultrasound;DME Instruction;Gait training;Stair training;Balance training;Therapeutic exercise;Therapeutic activities;Functional mobility training;Neuromuscular re-education;Patient/family education;Manual techniques;Passive range of motion;Energy conservation    PT Next Visit Plan  give new HEP    Consulted and Agree with Plan of Care  Patient       Patient will benefit from skilled therapeutic intervention in order to improve the following deficits and impairments:  Abnormal gait, Decreased activity tolerance, Decreased balance,  Decreased coordination, Decreased endurance, Decreased mobility, Decreased range of motion, Decreased safety awareness, Decreased strength, Difficulty walking, Impaired flexibility, Impaired perceived functional ability, Improper body mechanics, Postural dysfunction, Pain, Impaired UE functional use, Decreased knowledge of precautions, Cardiopulmonary status limiting activity, Decreased knowledge of use of DME  Visit Diagnosis: 1. Muscle weakness (generalized)   2. Unsteadiness on feet   3. Neurologic gait disorder        Problem List Patient Active Problem List   Diagnosis Date Noted  . Cognitive deficits   . Labile blood pressure   . Acute blood loss anemia   . Slow transit constipation   . Vascular headache   . 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 Hydrocephalus ([place] 07/03/2017  . 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 Pelvic fracture ([place] 04/18/2016  . CD (celiac disease) 08/04/2014  . Cancer of upper lobe of right lung ([place] 08/04/2014  . Age related osteoporosis 11/26/2013  . Absolute anemia 05/21/2013  . H/O malignant neoplasm 05/21/2013  . HLD (hyperlipidemia) 05/21/2013   MJanna Arch PT, DPT   08/05/2018, 10:30 AM  [place]MAIN [facility]Prov[facility] CenterSERVICES [address] [place] [address]Phone: [phone]  Fax:  [phone] Name: MMABRY SANTARELLIMRN: [id number] of Birth: 308-Aug-1947

## 2018-08-07 ENCOUNTER — Ambulatory Visit: Payer: PPO

## 2018-08-07 DIAGNOSIS — M6281 Muscle weakness (generalized): Secondary | ICD-10-CM

## 2018-08-07 DIAGNOSIS — R269 Unspecified abnormalities of gait and mobility: Secondary | ICD-10-CM

## 2018-08-07 DIAGNOSIS — R2681 Unsteadiness on feet: Secondary | ICD-10-CM

## 2018-08-07 NOTE — Therapy (Signed)
[place] MAIN [facility] SERVICES [address] [place], [place], [address] Phone: [phone]   Fax:  [phone]  Physical Therapy Treatment  Patient Details  Name: Krista Evans MRN: [id number] Date of Birth: [birth year] Referring Provider (PT): Ramonita Lab   Encounter Date: 08/07/2018  PT End of Session - 08/07/18 0948    Visit Number  8    Number of Visits  16    Date for PT Re-Evaluation  09/10/18    Authorization Type  8/10 re eval starting 6/30    PT Start Time  0944    PT Stop Time  1024    PT Time Calculation (min)  40 min    Equipment Utilized During Treatment  Gait belt    Activity Tolerance  Patient tolerated treatment well    Behavior During Therapy  WFL for tasks assessed/performed       Past Medical History:  Diagnosis Date  . Abnormal Q waves on electrocardiogram   . Anemia   . Aortic atherosclerosis ([place])    "I could possibly have it, my grandmother had it"  . Cancer [facility]) 05/2011   Right upper Lung CA with partial Lobectomy.  . Celiac disease   . Celiac disease   . Dyspnea    with exertion  . Hyperlipidemia   . Hyperlipidemia   . Lung mass   . Meningioma ([place])   . Osteoporosis   . Personal history of chemotherapy   . Personal history of radiation therapy     Past Surgical History:  Procedure Laterality Date  . LUNG LOBECTOMY     right lung  . 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  Subjective Assessment - 08/07/18 0946    Subjective  Patient reports no falls or LOB since last session.  No complaints or concerns at this time.    Pertinent History  Patient is returning to outpatient physical therapy after closure for COVID. Due to patient's last session being in March, patient is being re-evaluated today. PMH  includes anemia, aortic atherosclerosis, cancer, celiac disease, dyspnea, HLD, meningioma, osteoporosis, history of chemo/radiation therapy, ventriculoperitoneal shunt (07/03/17).    Limitations  Lifting;Standing;Walking;House hold activities;Other (comment)    How long can you sit comfortably?  n/a    How long can you stand comfortably?  5 minutes    How long can you walk comfortably?  400 ft before fatigue/out of breathe.    Patient Stated Goals  Patient wants to be able to step up onto a curb without holding on    Currently in Pain?  No/denies           TREATMENT:   Nustep level 3 3 minutes with RPM>60 for cardiovascular challenge/support      Neuro Re-Ed    Ambulate in hallway with red light/green light for initiation/termination of ambulation with no UE support frequent near LOB when stopping ambulation requiring assistance to maintain stability.     6" step toe taps with SUE support and CGA , max cueing for sequencing due to fear of LOB 15x each LOB   6" lateral toe taps with SUE support and CGA 12x each LE   Seated alphabet LLE for coordination and sequencing for coordination and positional awareness.    TherEx:    Ambulationwithout AD in hallway >1042fetwithverbal cues  for improving forward momentumand to promote L arm swing. CGA.three standing rest breaks. Patient has near LOB when turning corners requiring CGA-MinA to retain COM   Modified squat with hands on bar for proper gluteal activation Min A for forward trunk lean/momentum. 10x    Standing hip extensions 12x each LE    Standing heel toe raises with BUE support 12x   Sit to stand hands on knees CGA 5x  Modified squat 5x with BUE support.   Seated adduction ball squeeze with LAQ, challenge keeping left foot in sequence with right. 12x        Pt educated throughout session about proper posture and technique with exercises. Improved exercise technique, movement at target joints, use of target  muscles after min to mod verbal, visual, tactile cues.                PT Education - 08/07/18 0947    Education provided  Yes    Education Details  exercise technique, stability    Person(s) Educated  Patient    Methods  Explanation;Demonstration;Tactile cues;Verbal cues    Comprehension  Verbalized understanding;Returned demonstration;Verbal cues required;Tactile cues required       PT Short Term Goals - 07/16/18 1304      PT SHORT TERM GOAL #1   Title  Patient will be independent in HEP demonstrating successful between session carryover and improve functional mobility and ease of completing ADLs.    Baseline  6/30: HEP given next session    Time  2    Period  Weeks    Status  New    Target Date  07/30/18      PT SHORT TERM GOAL #2   Title  Patient will be able to transfer sit<>Stand from regular height chair without pushing on arm rests to exhibit improved independence improved functional strength.     Baseline  6/30: requires excessive BUE support    Time  2    Period  Weeks    Status  New    Target Date  07/30/18        PT Long Term Goals - 07/16/18 1305      PT LONG TERM GOAL #1   Title  Patient will increase six minute walk test distance to >1000 for progression to community ambulator and improve gait ability    Baseline  6/30: 880 ft with rollator    Time  8    Period  Weeks    Status  New    Target Date  09/10/18      PT LONG TERM GOAL #2   Title  Patient (> 75 years old) will complete five times sit to stand test in < 15 seconds without UE support indicating an increased LE strength and improved balance.    Baseline  6/30: 16 seconds with excessive BUE support    Time  8    Period  Weeks    Status  New    Target Date  09/10/18      PT LONG TERM GOAL #3   Title  Patient will demonstrate an improved Berg Balance Score of >42/56 as to demonstrate improved balance with ADLs such as sitting/standing and transfer balance and reduced fall risk.      Baseline  6/30: 37/56    Time  8    Period  Weeks    Status  New    Target Date  09/10/18      PT LONG TERM GOAL #4  Title  Patient will improve LEFS to 54/80 to demonstrate improved fucnctional strenth and mobility and ease of indepedence    Baseline  6/30: 32/80    Time  8    Period  Weeks    Status  New    Target Date  09/10/18            Plan - 08/07/18 1013    Clinical Impression Statement  Patient is progressing with her ability to terminate her velocity and stop with ambulation without LOB. Single leg stability continues to challenge her as does her fear of losing her balance resulting in a need for UE support. Patient will benefit from skilled physical therapy to increase strength and stability for decreased fall risk and improved capacity for functional mobility.    Personal Factors and Comorbidities  Age;Comorbidity 3+;Fitness;Past/Current Experience;Social Background;Time since onset of injury/illness/exacerbation    Comorbidities  anemia, cancer, celiac disease, dyspnea, meningioma, osteoporosis, HLD    Examination-Activity Limitations  Bed Mobility;Bathing;Bend;Carry;Dressing;Hygiene/Grooming;Locomotion Level;Reach Overhead;Sit;Squat;Stairs;Stand;Toileting;Transfers    Examination-Participation Restrictions  Church;Cleaning;Community Activity;Driving;Interpersonal Relationship;Meal Prep;Laundry;Shop;Volunteer;Yard Work    [employer] officer  Evolving/Moderate complexity    Rehab Potential  Fair    PT Frequency  2x / week    PT Duration  8 weeks    PT Treatment/Interventions  ADLs/Self Care Home Management;Cryotherapy;Electrical Stimulation;Iontophoresis 19m/ml Dexamethasone;Moist Heat;Ultrasound;DME Instruction;Gait training;Stair training;Balance training;Therapeutic exercise;Therapeutic activities;Functional mobility training;Neuromuscular re-education;Patient/family education;Manual techniques;Passive range of motion;Energy conservation    PT Next  Visit Plan  give new HEP    Consulted and Agree with Plan of Care  Patient       Patient will benefit from skilled therapeutic intervention in order to improve the following deficits and impairments:  Abnormal gait, Decreased activity tolerance, Decreased balance, Decreased coordination, Decreased endurance, Decreased mobility, Decreased range of motion, Decreased safety awareness, Decreased strength, Difficulty walking, Impaired flexibility, Impaired perceived functional ability, Improper body mechanics, Postural dysfunction, Pain, Impaired UE functional use, Decreased knowledge of precautions, Cardiopulmonary status limiting activity, Decreased knowledge of use of DME  Visit Diagnosis: 1. Muscle weakness (generalized)   2. Unsteadiness on feet   3. Neurologic gait disorder        Problem List Patient Active Problem List   Diagnosis Date Noted  . Cognitive deficits   . Labile blood pressure   . Acute blood loss anemia   . Slow transit constipation   . Vascular headache   . 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 Hydrocephalus ([place] 07/03/2017  . 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 Pelvic fracture ([place] 04/18/2016  . CD (celiac disease) 08/04/2014  . [place] 08/04/2014  . Age related osteoporosis 11/26/2013  . Absolute anemia 05/21/2013  . H/O malignant neoplasm 05/21/2013  . HLD (hyperlipidemia) 05/21/2013   MJanna Arch PT, DPT   08/07/2018, 10:23 AM  [place] [facility]SERV[facility] [address] [address]Phone: [phone]  Fax:  [phone] Name: MLAMIAH MARMOLMRN: [id number] of Birth: [birth year]

## 2018-08-12 ENCOUNTER — Encounter: Payer: Self-pay | Admitting: Physical Therapy

## 2018-08-12 ENCOUNTER — Other Ambulatory Visit: Payer: Self-pay

## 2018-08-12 ENCOUNTER — Ambulatory Visit: Payer: PPO | Admitting: Physical Therapy

## 2018-08-12 DIAGNOSIS — M6281 Muscle weakness (generalized): Secondary | ICD-10-CM

## 2018-08-12 DIAGNOSIS — R2681 Unsteadiness on feet: Secondary | ICD-10-CM

## 2018-08-12 DIAGNOSIS — R269 Unspecified abnormalities of gait and mobility: Secondary | ICD-10-CM

## 2018-08-12 NOTE — Therapy (Signed)
[place] MAIN [facility] SERVICES [address] [place], [place], [address] Phone: [phone]   Fax:  [phone]  Physical Therapy Treatment  Patient Details  Name: Krista Evans MRN: [id number] Date of Birth: [birth year] Referring Provider (PT): Ramonita Lab   Encounter Date: 08/12/2018  PT End of Session - 08/12/18 1308    Visit Number  9    Number of Visits  16    Date for PT Re-Evaluation  09/10/18    Authorization Type  9/10 re eval starting 6/30    PT Start Time  0100    PT Stop Time  0140    PT Time Calculation (min)  40 min    Equipment Utilized During Treatment  Gait belt    Activity Tolerance  Patient tolerated treatment well    Behavior During Therapy  WFL for tasks assessed/performed       Past Medical History:  Diagnosis Date  . Abnormal Q waves on electrocardiogram   . Anemia   . Aortic atherosclerosis ([place])    "I could possibly have it, my grandmother had it"  . Cancer [facility]) 05/2011   Right upper Lung CA with partial Lobectomy.  . Celiac disease   . Celiac disease   . Dyspnea    with exertion  . Hyperlipidemia   . Hyperlipidemia   . Lung mass   . Meningioma ([place])   . Osteoporosis   . Personal history of chemotherapy   . Personal history of radiation therapy     Past Surgical History:  Procedure Laterality Date  . LUNG LOBECTOMY     right lung  . 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  Subjective Assessment - 08/12/18 1307    Subjective  Patient reports no falls or LOB since last session.  No complaints or concerns at this time.    Pertinent History  Patient is returning to outpatient physical therapy after closure for COVID. Due to patient's last session being in March, patient is being re-evaluated today. PMH  includes anemia, aortic atherosclerosis, cancer, celiac disease, dyspnea, HLD, meningioma, osteoporosis, history of chemo/radiation therapy, ventriculoperitoneal shunt (07/03/17).    Limitations  Lifting;Standing;Walking;House hold activities;Other (comment)    How long can you sit comfortably?  n/a    How long can you stand comfortably?  5 minutes    How long can you walk comfortably?  400 ft before fatigue/out of breathe.    Patient Stated Goals  Patient wants to be able to step up onto a curb without holding on    Currently in Pain?  No/denies    Multiple Pain Sites  No         Ther-ex  Nu-step x 5 mins  Hip flexion marches with 2# ankle weights  x 20 bilateral x 2  sets Hip abduction with 2#  x 20 bilateral x 2 sets HS curls with 2#  x 20 bilateral  2 sets Hip extension with 2#  x 20 bilateral  2 sets Hooklying marching with 2  # x 20 x 2 sets  hooklying ER/Abd x 20 with RTB  2 sets Bridging x 10 x 2  2 sets Sit to stand without UE support from regular height chair x 10     Neuromuscular Re-education  Side stepping on floor  with UE  support x 2 lengths Side stepping on balance beam foam  with UE support x 2 lengths; Heel/toe raises without UE support 3 sec hold x 10 each; 1/2 foam roll balance with flat side up 30 sec x 2; 1/2 foam roll tandem balance alternating forward LE 30s x 2 each LE forward;      Pt educated throughout session about proper posture and technique with exercises. Improved exercise technique, movement at target joints, use of target muscles after min to mod verbal, visual, tactile cues.                       PT Education - 08/12/18 1308    Education provided  Yes    Education Details  HEP    Person(s) Educated  Patient    Methods  Explanation    Comprehension  Verbalized understanding;Returned demonstration       PT Short Term Goals - 07/16/18 1304      PT SHORT TERM GOAL #1   Title  Patient will be independent in HEP  demonstrating successful between session carryover and improve functional mobility and ease of completing ADLs.    Baseline  6/30: HEP given next session    Time  2    Period  Weeks    Status  New    Target Date  07/30/18      PT SHORT TERM GOAL #2   Title  Patient will be able to transfer sit<>Stand from regular height chair without pushing on arm rests to exhibit improved independence improved functional strength.     Baseline  6/30: requires excessive BUE support    Time  2    Period  Weeks    Status  New    Target Date  07/30/18        PT Long Term Goals - 07/16/18 1305      PT LONG TERM GOAL #1   Title  Patient will increase six minute walk test distance to >1000 for progression to community ambulator and improve gait ability    Baseline  6/30: 880 ft with rollator    Time  8    Period  Weeks    Status  New    Target Date  09/10/18      PT LONG TERM GOAL #2   Title  Patient (> 36 years old) will complete five times sit to stand test in < 15 seconds without UE support indicating an increased LE strength and improved balance.    Baseline  6/30: 16 seconds with excessive BUE support    Time  8    Period  Weeks    Status  New    Target Date  09/10/18      PT LONG TERM GOAL #3   Title  Patient will demonstrate an improved Berg Balance Score of >42/56 as to demonstrate improved balance with ADLs such as sitting/standing and transfer balance and reduced fall risk.     Baseline  6/30: 37/56    Time  8    Period  Weeks    Status  New    Target Date  09/10/18      PT LONG TERM GOAL #4   Title  Patient will improve LEFS to 54/80 to demonstrate improved fucnctional strenth and mobility and ease of indepedence    Baseline  6/30: 32/80    Time  8    Period  Weeks    Status  New  Target Date  09/10/18            Plan - 08/12/18 1308    Clinical Impression Statement  Patient required min verbal cueing during balance challenges, and required CGA during all dynamic  standing balance activities. Patient required occasional rest breaks between exercises due to fatigue. Patient tolerated exercise well. Patient will continue to benefit from skilled therapy in order to improve dynamic standing balance activities and increase gait speed to reduce risk for falls    Personal Factors and Comorbidities  Age;Comorbidity 3+;Fitness;Past/Current Experience;Social Background;Time since onset of injury/illness/exacerbation    Comorbidities  anemia, cancer, celiac disease, dyspnea, meningioma, osteoporosis, HLD    Examination-Activity Limitations  Bed Mobility;Bathing;Bend;Carry;Dressing;Hygiene/Grooming;Locomotion Level;Reach Overhead;Sit;Squat;Stairs;Stand;Toileting;Transfers    Examination-Participation Restrictions  Church;Cleaning;Community Activity;Driving;Interpersonal Relationship;Meal Prep;Laundry;Shop;Volunteer;Yard Work    [employer] officer  Evolving/Moderate complexity    Rehab Potential  Fair    PT Frequency  2x / week    PT Duration  8 weeks    PT Treatment/Interventions  ADLs/Self Care Home Management;Cryotherapy;Electrical Stimulation;Iontophoresis 17m/ml Dexamethasone;Moist Heat;Ultrasound;DME Instruction;Gait training;Stair training;Balance training;Therapeutic exercise;Therapeutic activities;Functional mobility training;Neuromuscular re-education;Patient/family education;Manual techniques;Passive range of motion;Energy conservation    PT Next Visit Plan  give new HEP    Consulted and Agree with Plan of Care  Patient       Patient will benefit from skilled therapeutic intervention in order to improve the following deficits and impairments:  Abnormal gait, Decreased activity tolerance, Decreased balance, Decreased coordination, Decreased endurance, Decreased mobility, Decreased range of motion, Decreased safety awareness, Decreased strength, Difficulty walking, Impaired flexibility, Impaired perceived functional ability, Improper body mechanics,  Postural dysfunction, Pain, Impaired UE functional use, Decreased knowledge of precautions, Cardiopulmonary status limiting activity, Decreased knowledge of use of DME  Visit Diagnosis: 1. Muscle weakness (generalized)   2. Unsteadiness on feet   3. Neurologic gait disorder        Problem List Patient Active Problem List   Diagnosis Date Noted  . Cognitive deficits   . Labile blood pressure   . Acute blood loss anemia   . Slow transit constipation   . Vascular headache   . 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 Hydrocephalus ([place] 07/03/2017  . 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 Pelvic fracture ([place] 04/18/2016  . CD (celiac disease) 08/04/2014  . Cancer of [place] lobe of right lung ([place] 08/04/2014  . Age related osteoporosis 11/26/2013  . Absolute anemia 05/21/2013  . H/O malignant neoplasm 05/21/2013  . HLD (hyperlipidemia) 05/21/2013    MArelia SneddonS,PT DPT 08/12/2018, 1:10 PM  CPember[place] HeightsMAIN RThe [facility] LLCSERVICES [address] [address]Phone: [phone]  Fax:  [phone] Name: Krista MCCARYMRN: [id number] of Birth: 3Jan 19, 1947

## 2018-08-14 ENCOUNTER — Ambulatory Visit: Payer: PPO

## 2018-08-14 ENCOUNTER — Other Ambulatory Visit: Payer: Self-pay

## 2018-08-14 DIAGNOSIS — R269 Unspecified abnormalities of gait and mobility: Secondary | ICD-10-CM

## 2018-08-14 DIAGNOSIS — M6281 Muscle weakness (generalized): Secondary | ICD-10-CM | POA: Diagnosis not present

## 2018-08-14 DIAGNOSIS — R2681 Unsteadiness on feet: Secondary | ICD-10-CM

## 2018-08-14 NOTE — Therapy (Signed)
[facility] [address] [place], [place], [address] Phone: [phone]   Fax:  [phone]  Physical Therapy Treatment Physical Therapy Progress Note   Dates of reporting period  07/16/18  to   08/14/18  Patient Details  Name: Krista Evans MRN: [id number] Date of Birth: 17-Dec-1945 Referring Provider (PT): Ramonita Lab   Encounter Date: 08/14/2018  PT End of Session - 08/14/18 1252    Visit Number  10    Number of Visits  16    Date for PT Re-Evaluation  09/10/18    Authorization Type  10/10 re eval starting 6/30; next session 1/10 PN start 7/29    PT Start Time  1259    PT Stop Time  1340    PT Time Calculation (min)  41 min    Equipment Utilized During Treatment  Gait belt    Activity Tolerance  Patient tolerated treatment well    Behavior During Therapy  WFL for tasks assessed/performed       Past Medical History:  Diagnosis Date  . Abnormal Q waves on electrocardiogram   . Anemia   . Aortic atherosclerosis ([place])    "I could possibly have it, my grandmother had it"  . Cancer [facility]) 05/2011   Right upper Lung CA with partial Lobectomy.  . Celiac disease   . Celiac disease   . Dyspnea    with exertion  . Hyperlipidemia   . Hyperlipidemia   . Lung mass   . Meningioma ([place])   . Osteoporosis   . Personal history of chemotherapy   . Personal history of radiation therapy     Past Surgical History:  Procedure Laterality Date  . LUNG LOBECTOMY     right lung  . 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  Subjective Assessment - 08/14/18 1301    Subjective  Patient reports no falls since last session. No pain or complaints at this time. HEP compliant.    Pertinent History  Patient is returning to outpatient physical  therapy after closure for COVID. Due to patient's last session being in March, patient is being re-evaluated today. PMH includes anemia, aortic atherosclerosis, cancer, celiac disease, dyspnea, HLD, meningioma, osteoporosis, history of chemo/radiation therapy, ventriculoperitoneal shunt (07/03/17).    Limitations  Lifting;Standing;Walking;House hold activities;Other (comment)    How long can you sit comfortably?  n/a    How long can you stand comfortably?  5 minutes    How long can you walk comfortably?  400 ft before fatigue/out of breathe.    Patient Stated Goals  Patient wants to be able to step up onto a curb without holding on    Currently in Pain?  No/denies         [facility] PT Assessment - 08/14/18 0001      Berg Balance Test   Sit to Stand  Able to stand without using hands and stabilize independently    Standing Unsupported  Able to stand 2 minutes with supervision    Sitting with Back Unsupported but Feet Supported on Floor or Stool  Able to sit safely and securely 2 minutes    Stand to Sit  Controls descent by using hands    Transfers  Able to transfer safely, definite need of hands    Standing Unsupported with  Eyes Closed  Able to stand 10 seconds with supervision    Standing Unsupported with Feet Together  Able to place feet together independently and stand for 1 minute with supervision    From Standing, Reach Forward with Outstretched Arm  Can reach forward >12 cm safely (5")    From Standing Position, Pick up Object from [place] to pick up shoe, needs supervision    From Standing Position, Turn to Look Behind Over each Shoulder  Looks behind from both sides and weight shifts well    Turn 360 Degrees  Able to turn 360 degrees safely but slowly    Standing Unsupported, Alternately Place Feet on Step/Stool  Able to complete >2 steps/needs minimal assist    Standing Unsupported, One Foot in [place] to take small step independently and hold 30 seconds    Standing on One Leg   Tries to lift leg/unable to hold 3 seconds but remains standing independently    Total Score  39           Goals: STS without UE support; able to perform with increased time.  6 MWT: 882 ft with 3 minutes w/o rollator.  5x STS 15 seconds SUE support (RLE) one episode of LOB BERG: 39/56  LEFS: 39/80   Treatment:   airex pad:  -static stand 60 seconds cueing for upright posture: preference for forward trunk lean -static march; challenge with LLE placement upon weight acceptance 12x.   LLE step over orange hurdle and back 15x with BUE support and challenge for coordination/sequencing.   Patient's condition has the potential to improve in response to therapy. Maximum improvement is yet to be obtained. The anticipated improvement is attainable and reasonable in a generally predictable time.  Patient reports she feels she is becoming more steady and able to move better at home. Is more coordinated feeling and less scared of losing her balance.                      PT Education - 08/14/18 1251    Education provided  Yes    Education Details  exercise technique, goals,    Person(s) Educated  Patient    Methods  Explanation;Demonstration;Tactile cues;Verbal cues    Comprehension  Verbalized understanding;Returned demonstration;Verbal cues required;Tactile cues required;Need further instruction       PT Short Term Goals - 08/14/18 1312      PT SHORT TERM GOAL #1   Title  Patient will be independent in HEP demonstrating successful between session carryover and improve functional mobility and ease of completing ADLs.    Baseline  6/30: HEP given next session 7/29: HEP compliant    Time  2    Period  Weeks    Status  Achieved    Target Date  07/30/18      PT SHORT TERM GOAL #2   Title  Patient will be able to transfer sit<>Stand from regular height chair without pushing on arm rests to exhibit improved independence improved functional strength.     Baseline   6/30: requires excessive BUE support 7/20 : able to perform    Time  2    Period  Weeks    Status  Achieved    Target Date  07/30/18        PT Long Term Goals - 08/14/18 1314      PT LONG TERM GOAL #1   Title  Patient will increase six minute walk test distance  to >1000 for progression to community ambulator and improve gait ability    Baseline  6/30: 880 ft with rollator 7/29: 882 ft with 3 minutes w/o rollator    Time  8    Period  Weeks    Status  Partially Met    Target Date  09/10/18      PT LONG TERM GOAL #2   Title  Patient (> 74 years old) will complete five times sit to stand test in < 15 seconds without UE support indicating an increased LE strength and improved balance.    Baseline  6/30: 16 seconds with excessive BUE support 7/29: 15 seconds with SUE support and one LOB    Time  8    Period  Weeks    Status  Partially Met    Target Date  09/10/18      PT LONG TERM GOAL #3   Title  Patient will demonstrate an improved Berg Balance Score of >42/56 as to demonstrate improved balance with ADLs such as sitting/standing and transfer balance and reduced fall risk.     Baseline  6/30: 37/56 7/29: 39/56    Time  8    Period  Weeks    Status  Partially Met    Target Date  09/10/18      PT LONG TERM GOAL #4   Title  Patient will improve LEFS to 54/80 to demonstrate improved fucnctional strenth and mobility and ease of indepedence    Baseline  6/30: 32/80 7/29: 39/80    Time  8    Period  Weeks    Status  Partially Met    Target Date  09/10/18            Plan - 08/14/18 1344    Clinical Impression Statement  Patient performed goals for progress note this session. Patient has met short term goals and is able to perform a sit to stand without UE assistance with increased time. Has preference for using UE's due to fear of falling however. BERG increased by 2 points however patient continues to be limited in velocity of movements and single limb tasks. Patient performed  approximately the same distance with the 6 MWT but without her AD for half of the distance. Patient's condition has the potential to improve in response to therapy. Maximum improvement is yet to be obtained. The anticipated improvement is attainable and reasonable in a generally predictable time. Patient will continue to benefit from skilled therapy in order to improve dynamic standing balance activities and increase gait speed to reduce risk for falls    Personal Factors and Comorbidities  Age;Comorbidity 3+;Fitness;Past/Current Experience;Social Background;Time since onset of injury/illness/exacerbation    Comorbidities  anemia, cancer, celiac disease, dyspnea, meningioma, osteoporosis, HLD    Examination-Activity Limitations  Bed Mobility;Bathing;Bend;Carry;Dressing;Hygiene/Grooming;Locomotion Level;Reach Overhead;Sit;Squat;Stairs;Stand;Toileting;Transfers    Examination-Participation Restrictions  Church;Cleaning;Community Activity;Driving;Interpersonal Relationship;Meal Prep;Laundry;Shop;Volunteer;Yard Work    [employer] officer  Evolving/Moderate complexity    Rehab Potential  Fair    PT Frequency  2x / week    PT Duration  8 weeks    PT Treatment/Interventions  ADLs/Self Care Home Management;Cryotherapy;Electrical Stimulation;Iontophoresis 25m/ml Dexamethasone;Moist Heat;Ultrasound;DME Instruction;Gait training;Stair training;Balance training;Therapeutic exercise;Therapeutic activities;Functional mobility training;Neuromuscular re-education;Patient/family education;Manual techniques;Passive range of motion;Energy conservation    PT Next Visit Plan  give new HEP    Consulted and Agree with Plan of Care  Patient       Patient will benefit from skilled therapeutic intervention in order to improve the following deficits and impairments:  Abnormal gait, Decreased activity tolerance, Decreased balance, Decreased coordination, Decreased endurance, Decreased mobility, Decreased range of  motion, Decreased safety awareness, Decreased strength, Difficulty walking, Impaired flexibility, Impaired perceived functional ability, Improper body mechanics, Postural dysfunction, Pain, Impaired UE functional use, Decreased knowledge of precautions, Cardiopulmonary status limiting activity, Decreased knowledge of use of DME  Visit Diagnosis: 1. Muscle weakness (generalized)   2. Unsteadiness on feet   3. Neurologic gait disorder        Problem List Patient Active Problem List   Diagnosis Date Noted  . Cognitive deficits   . Labile blood pressure   . Acute blood loss anemia   . Slow transit constipation   . Vascular headache   . 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 Hydrocephalus ([place]) 07/03/2017  . 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 Pelvic fracture ([place]) 04/18/2016  . CD (celiac disease) 08/04/2014  . Cancer of upper lobe of right lung ([place]) 08/04/2014  . Age related osteoporosis 11/26/2013  . Absolute anemia 05/21/2013  . H/O malignant neoplasm 05/21/2013  . HLD (hyperlipidemia) 05/21/2013   Janna Arch, PT, DPT   08/14/2018, 1:44 PM  [place] [facility] SERVICES [address] [place], [place], [address] Phone: [phone]   Fax:  [phone]  Name: Krista Evans MRN: [id number] Date of Birth: [birth year]

## 2018-08-19 ENCOUNTER — Other Ambulatory Visit: Payer: Self-pay

## 2018-08-19 ENCOUNTER — Ambulatory Visit: Payer: PPO | Attending: Internal Medicine

## 2018-08-19 DIAGNOSIS — R269 Unspecified abnormalities of gait and mobility: Secondary | ICD-10-CM | POA: Diagnosis not present

## 2018-08-19 DIAGNOSIS — M6281 Muscle weakness (generalized): Secondary | ICD-10-CM

## 2018-08-19 DIAGNOSIS — R2681 Unsteadiness on feet: Secondary | ICD-10-CM | POA: Insufficient documentation

## 2018-08-19 NOTE — Therapy (Signed)
[place] [facility] SERVICES [address] [place], [place], [address] Phone: [phone]   Fax:  [phone]  Physical Therapy Treatment  Patient Details  Name: Krista Evans MRN: [id number] Date of Birth: 11-Aug-1945 Referring Provider (PT): Ramonita Lab   Encounter Date: 08/19/2018  PT End of Session - 08/19/18 0856    Visit Number  11    Number of Visits  16    Date for PT Re-Evaluation  09/10/18    Authorization Type  1/10 PN start 7/29    PT Start Time  0851    PT Stop Time  0935    PT Time Calculation (min)  44 min    Equipment Utilized During Treatment  Gait belt    Activity Tolerance  Patient tolerated treatment well    Behavior During Therapy  [facility] for tasks assessed/performed       Past Medical History:  Diagnosis Date  . Abnormal Q waves on electrocardiogram   . Anemia   . Aortic atherosclerosis ([place])    "I could possibly have it, my grandmother had it"  . Cancer [facility]) 05/2011   Right upper Lung CA with partial Lobectomy.  . Celiac disease   . Celiac disease   . Dyspnea    with exertion  . Hyperlipidemia   . Hyperlipidemia   . Lung mass   . Meningioma ([place])   . Osteoporosis   . Personal history of chemotherapy   . Personal history of radiation therapy     Past Surgical History:  Procedure Laterality Date  . LUNG LOBECTOMY     right lung  . 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  Subjective Assessment - 08/19/18 0854    Subjective  Patient reports compliance with HEP. Reports no falls or LOB since last session.    Pertinent History  Patient is returning to outpatient physical therapy after closure for COVID. Due to patient's last session being in March, patient is being re-evaluated today. PMH includes anemia, aortic  atherosclerosis, cancer, celiac disease, dyspnea, HLD, meningioma, osteoporosis, history of chemo/radiation therapy, ventriculoperitoneal shunt (07/03/17).    Limitations  Lifting;Standing;Walking;House hold activities;Other (comment)    How long can you sit comfortably?  n/a    How long can you stand comfortably?  5 minutes    How long can you walk comfortably?  400 ft before fatigue/out of breathe.    Patient Stated Goals  Patient wants to be able to step up onto a curb without holding on    Currently in Pain?  No/denies          Nustep level 3 3 minutes with RPM>60 for cardiovascular challenge/support      Neuro Re-Ed   Ambulate in hallway with red light/green light for initiation/termination of ambulation with no UE support frequent near LOB when stopping ambulation requiring assistance to maintain stability.     standing ball toss/lift with walking in hallway with CGA 2x 86 ft. 2 near LOB ; patient challenged with dual task nature of intervention.     TherEx:       seated heel toe raises 2 minutes    Seated adduction ball squeeze12x  3 second holds; cueing to squeeze with LLE first to initiate muscle activation   Seated LAQ 3 second holds ; 10x  Seated windmills (modified) 5x each LE   Quantum leg press: BLE;  #120 10x ; cueing for keeping RLE neutral due to preference for adduction, cueing for slow velocity for maximal muscle contraction; 2 sets ; ball between knees to promote neutral alignment and correct muscle activation   Quantum leg press: SLE : #45 each LE, 15x each, 2 sets      Pt educated throughout session about proper posture and technique with exercises. Improved exercise technique, movement at target joints, use of target muscles after min to mod verbal, visual, tactile cues.                     PT Education - 08/19/18 0855    Education provided  Yes    Education Details  exercise technique, stability    Person(s) Educated  Patient     Methods  Explanation;Demonstration;Tactile cues;Verbal cues    Comprehension  Verbalized understanding;Returned demonstration;Verbal cues required;Tactile cues required       PT Short Term Goals - 08/14/18 1312      PT SHORT TERM GOAL #1   Title  Patient will be independent in HEP demonstrating successful between session carryover and improve functional mobility and ease of completing ADLs.    Baseline  6/30: HEP given next session 7/29: HEP compliant    Time  2    Period  Weeks    Status  Achieved    Target Date  07/30/18      PT SHORT TERM GOAL #2   Title  Patient will be able to transfer sit<>Stand from regular height chair without pushing on arm rests to exhibit improved independence improved functional strength.     Baseline  6/30: requires excessive BUE support 7/20 : able to perform    Time  2    Period  Weeks    Status  Achieved    Target Date  07/30/18        PT Long Term Goals - 08/14/18 1314      PT LONG TERM GOAL #1   Title  Patient will increase six minute walk test distance to >1000 for progression to community ambulator and improve gait ability    Baseline  6/30: 880 ft with rollator 7/29: 882 ft with 3 minutes w/o rollator    Time  8    Period  Weeks    Status  Partially Met    Target Date  09/10/18      PT LONG TERM GOAL #2   Title  Patient (> 75 years old) will complete five times sit to stand test in < 15 seconds without UE support indicating an increased LE strength and improved balance.    Baseline  6/30: 16 seconds with excessive BUE support 7/29: 15 seconds with SUE support and one LOB    Time  8    Period  Weeks    Status  Partially Met    Target Date  09/10/18      PT LONG TERM GOAL #3   Title  Patient will demonstrate an improved Berg Balance Score of >42/56 as to demonstrate improved balance with ADLs such as sitting/standing and transfer balance and reduced fall risk.     Baseline  6/30: 37/56 7/29: 39/56    Time  8    Period  Weeks     Status  Partially Met    Target Date  09/10/18      PT LONG TERM GOAL #4   Title  Patient will improve LEFS to 54/80 to demonstrate improved fucnctional strenth and mobility and ease of indepedence    Baseline  6/30: 32/80 7/29: 39/80    Time  8    Period  Weeks    Status  Partially Met    Target Date  09/10/18            Plan - 08/19/18 0906    Clinical Impression Statement  Patient continues to progress with functional strength and mobility with good motivation. She requires rest breaks to "catch her breath" due to fatigue related shortness of breath with intense exercise technique. Patient continues to be fearful of LOB requiring encouragement for reducing UE assistance with standing interventions. Patient will continue to benefit from skilled therapy in order to improve dynamic standing balance activities and increase gait speed to reduce risk for falls    Personal Factors and Comorbidities  Age;Comorbidity 3+;Fitness;Past/Current Experience;Social Background;Time since onset of injury/illness/exacerbation    Comorbidities  anemia, cancer, celiac disease, dyspnea, meningioma, osteoporosis, HLD    Examination-Activity Limitations  Bed Mobility;Bathing;Bend;Carry;Dressing;Hygiene/Grooming;Locomotion Level;Reach Overhead;Sit;Squat;Stairs;Stand;Toileting;Transfers    Examination-Participation Restrictions  Church;Cleaning;Community Activity;Driving;Interpersonal Relationship;Meal Prep;Laundry;Shop;Volunteer;Yard Work    [employer] officer  Evolving/Moderate complexity    Rehab Potential  Fair    PT Frequency  2x / week    PT Duration  8 weeks    PT Treatment/Interventions  ADLs/Self Care Home Management;Cryotherapy;Electrical Stimulation;Iontophoresis 35m/ml Dexamethasone;Moist Heat;Ultrasound;DME Instruction;Gait training;Stair training;Balance training;Therapeutic exercise;Therapeutic activities;Functional mobility training;Neuromuscular re-education;Patient/family  education;Manual techniques;Passive range of motion;Energy conservation    PT Next Visit Plan  give new HEP    Consulted and Agree with Plan of Care  Patient       Patient will benefit from skilled therapeutic intervention in order to improve the following deficits and impairments:  Abnormal gait, Decreased activity tolerance, Decreased balance, Decreased coordination, Decreased endurance, Decreased mobility, Decreased range of motion, Decreased safety awareness, Decreased strength, Difficulty walking, Impaired flexibility, Impaired perceived functional ability, Improper body mechanics, Postural dysfunction, Pain, Impaired UE functional use, Decreased knowledge of precautions, Cardiopulmonary status limiting activity, Decreased knowledge of use of DME  Visit Diagnosis: 1. Muscle weakness (generalized)   2. Unsteadiness on feet   3. Neurologic gait disorder        Problem List Patient Active Problem List   Diagnosis Date Noted  . Cognitive deficits   . Labile blood pressure   . Acute blood loss anemia   . Slow transit constipation   . Vascular headache   . 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 [place] ([place] 07/03/2017  . Communicating [place]cephalus ([place] 07/03/2017  . Pelvic fracture ([place] 04/18/2016  . CD (celiac disease) 08/04/2014  . Cancer of [place] of right [place] ([place] 08/04/2014  . Age related osteoporosis 11/26/2013  . Absolute anemia 05/21/2013  . H/O malignant neoplasm 05/21/2013  . HLD (hyperlipidemia) 05/21/2013   MJanna Arch PT, DPT   08/19/2018, 9:39 AM  [place] [address] [place] [address]Phone: [phone]  Fax:  [phone] Name: Krista FERRARAMRN: [id number] of Birth: [birth year]

## 2018-08-21 ENCOUNTER — Ambulatory Visit: Payer: PPO

## 2018-08-21 ENCOUNTER — Other Ambulatory Visit: Payer: Self-pay

## 2018-08-21 DIAGNOSIS — R2681 Unsteadiness on feet: Secondary | ICD-10-CM

## 2018-08-21 DIAGNOSIS — M6281 Muscle weakness (generalized): Secondary | ICD-10-CM

## 2018-08-21 DIAGNOSIS — R269 Unspecified abnormalities of gait and mobility: Secondary | ICD-10-CM

## 2018-08-21 NOTE — Therapy (Signed)
[place] MAIN [facility] SERVICES [address] [place], [place], [address] Phone: [phone]   Fax:  [phone]  Physical Therapy Treatment  Patient Details  Name: Krista Evans MRN: [id number] Date of Birth: Jun 11, 1945 Referring Provider (PT): Ramonita Lab   Encounter Date: 08/21/2018  PT End of Session - 08/21/18 1304    Visit Number  12    Number of Visits  16    Date for PT Re-Evaluation  09/10/18    Authorization Type  2/10 PN start 7/29    PT Start Time  1300    PT Stop Time  1342    PT Time Calculation (min)  42 min    Equipment Utilized During Treatment  Gait belt    Activity Tolerance  Patient tolerated treatment well    Behavior During Therapy  WFL for tasks assessed/performed       Past Medical History:  Diagnosis Date  . Abnormal Q waves on electrocardiogram   . Anemia   . Aortic atherosclerosis ([place])    "I could possibly have it, my grandmother had it"  . Cancer [facility]) 05/2011   Right upper Lung CA with partial Lobectomy.  . Celiac disease   . Celiac disease   . Dyspnea    with exertion  . Hyperlipidemia   . Hyperlipidemia   . Lung mass   . Meningioma ([place])   . Osteoporosis   . Personal history of chemotherapy   . Personal history of radiation therapy     Past Surgical History:  Procedure Laterality Date  . LUNG LOBECTOMY     right lung  . 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  Subjective Assessment - 08/21/18 1302    Subjective  Patient reported compliance with HEP, no falls or stumbles since last session, no complaints of pain.    Pertinent History  Patient is returning to outpatient physical therapy after closure for COVID. Due to patient's last session being in March, patient is being re-evaluated today. PMH  includes anemia, aortic atherosclerosis, cancer, celiac disease, dyspnea, HLD, meningioma, osteoporosis, history of chemo/radiation therapy, ventriculoperitoneal shunt (07/03/17).    Limitations  Lifting;Standing;Walking;House hold activities;Other (comment)    How long can you sit comfortably?  n/a    How long can you stand comfortably?  5 minutes    How long can you walk comfortably?  400 ft before fatigue/out of breathe.    Currently in Pain?  No/denies      Treatment:  Nustep level 3 for 4 minutes with RPM>60 for cardiovascular challenge/support    Neuro Re-Ed   Ambulate in hallway with red light/green light for initiation/termination of ambulation with no UE support frequent near LOB when stopping ambulation requiring assistance to maintain stability.    standing ball toss/lift with walking in hallway with CGA 2x 86 ft. Pt able to correct LOB with CGA. Anxious throughout exercise due to fear of falling    TherEx:      Seated adduction ball squeeze12x  3 second holds; cueing to squeeze with LLE first to initiate muscle activation    Quantum leg press: BLE;  #120 10x ; cueing for keeping RLE neutral due to preference for adduction, cueing for slow velocity for maximal muscle contraction ; 2 sets ; ball between knees to promote neutral alignment and  correct muscle activation    Quantum leg press: SLE : #45 each LE, 15x each, 2 sets   Quantum leg press for calves x10 30# x10 45#      Pt educated throughout session about proper posture and technique with exercises. Improved exercise technique, movement at target joints, use of target muscles after min to mod verbal, visual, tactile cues.   Pt response/clinical impression: Pt exhibited good motivation to participate with therapy throughout session. Pt often anxious with balance activities due to fear of falling. CGA throughout to address balance deficits though no overt LOB noted today. The patient often requested/required sitting rest  breaks due to work of breathing. The patient would benefit from further skilled PT to maximize pt functional activities, safety, and QOL.    PT Education - 08/21/18 1303    Education provided  Yes    Education Details  exercise technique/stability    Person(s) Educated  Patient    Methods  Explanation;Demonstration;Tactile cues;Verbal cues    Comprehension  Verbalized understanding;Returned demonstration;Verbal cues required;Tactile cues required       PT Short Term Goals - 08/14/18 1312      PT SHORT TERM GOAL #1   Title  Patient will be independent in HEP demonstrating successful between session carryover and improve functional mobility and ease of completing ADLs.    Baseline  6/30: HEP given next session 7/29: HEP compliant    Time  2    Period  Weeks    Status  Achieved    Target Date  07/30/18      PT SHORT TERM GOAL #2   Title  Patient will be able to transfer sit<>Stand from regular height chair without pushing on arm rests to exhibit improved independence improved functional strength.     Baseline  6/30: requires excessive BUE support 7/20 : able to perform    Time  2    Period  Weeks    Status  Achieved    Target Date  07/30/18        PT Long Term Goals - 08/14/18 1314      PT LONG TERM GOAL #1   Title  Patient will increase six minute walk test distance to >1000 for progression to community ambulator and improve gait ability    Baseline  6/30: 880 ft with rollator 7/29: 882 ft with 3 minutes w/o rollator    Time  8    Period  Weeks    Status  Partially Met    Target Date  09/10/18      PT LONG TERM GOAL #2   Title  Patient (> 29 years old) will complete five times sit to stand test in < 15 seconds without UE support indicating an increased LE strength and improved balance.    Baseline  6/30: 16 seconds with excessive BUE support 7/29: 15 seconds with SUE support and one LOB    Time  8    Period  Weeks    Status  Partially Met    Target Date  09/10/18       PT LONG TERM GOAL #3   Title  Patient will demonstrate an improved Berg Balance Score of >42/56 as to demonstrate improved balance with ADLs such as sitting/standing and transfer balance and reduced fall risk.     Baseline  6/30: 37/56 7/29: 39/56    Time  8    Period  Weeks    Status  Partially Met    Target Date  09/10/18      PT LONG TERM GOAL #4   Title  Patient will improve LEFS to 54/80 to demonstrate improved fucnctional strenth and mobility and ease of indepedence    Baseline  6/30: 32/80 7/29: 39/80    Time  8    Period  Weeks    Status  Partially Met    Target Date  09/10/18            Plan - 08/21/18 1335    Clinical Impression Statement  Pt exhibited good motivation to participate with therapy throughout session. Pt often anxious with balance activities due to fear of falling. CGA throughout to address balance deficits though no overt LOB noted today. The patient often requested/required sitting rest breaks due to work of breathing. The patient would benefit from further skilled PT to maximize pt functional activities, safety, and QOL.    Personal Factors and Comorbidities  Age;Comorbidity 3+;Fitness;Past/Current Experience;Social Background;Time since onset of injury/illness/exacerbation    Comorbidities  anemia, cancer, celiac disease, dyspnea, meningioma, osteoporosis, HLD    Examination-Activity Limitations  Bed Mobility;Bathing;Bend;Carry;Dressing;Hygiene/Grooming;Locomotion Level;Reach Overhead;Sit;Squat;Stairs;Stand;Toileting;Transfers    Examination-Participation Restrictions  Church;Cleaning;Community Activity;Driving;Interpersonal Relationship;Meal Prep;Laundry;Shop;Volunteer;Yard Work    [employer] Potential  Fair    Clinical Impairments Affecting Rehab Potential  (+) improvement made within last year, eagerness to participate (-) HEP compliance, safety awareness     PT Frequency  2x / week    PT Duration  8 weeks    PT Treatment/Interventions  ADLs/Self Care  Home Management;Cryotherapy;Electrical Stimulation;Iontophoresis 80m/ml Dexamethasone;Moist Heat;Ultrasound;DME Instruction;Gait training;Stair training;Balance training;Therapeutic exercise;Therapeutic activities;Functional mobility training;Neuromuscular re-education;Patient/family education;Manual techniques;Passive range of motion;Energy conservation    PT Next Visit Plan  give new HEP    PT Home Exercise Plan  standing hip abduction, standing marches, standing hip extension, standing hamstring curls, mini squats    Consulted and Agree with Plan of Care  Patient       Patient will benefit from skilled therapeutic intervention in order to improve the following deficits and impairments:  Abnormal gait, Decreased activity tolerance, Decreased balance, Decreased coordination, Decreased endurance, Decreased mobility, Decreased range of motion, Decreased safety awareness, Decreased strength, Difficulty walking, Impaired flexibility, Impaired perceived functional ability, Improper body mechanics, Postural dysfunction, Pain, Impaired UE functional use, Decreased knowledge of precautions, Cardiopulmonary status limiting activity, Decreased knowledge of use of DME  Visit Diagnosis: 1. Muscle weakness (generalized)   2. Unsteadiness on feet   3. Neurologic gait disorder        Problem List Patient Active Problem List   Diagnosis Date Noted  . Cognitive deficits   . Labile blood pressure   . Acute blood loss anemia   . Slow transit constipation   . Vascular headache   . 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 [place]cephalus ([place] 07/03/2017  . 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 Pelvic fracture ([place] 04/18/2016  . CD (celiac disease) 08/04/2014  . Cancer of [place] lobe of right lung ([place] 08/04/2014  . Age related osteoporosis 11/26/2013  . Absolute anemia 05/21/2013  . H/O malignant neoplasm 05/21/2013  . HLD (hyperlipidemia) 05/21/2013    DLieutenant DiegoPT, DPT 1:48  PM,08/21/18 [address] [facility] [address] [address]Phone: [phone]  Fax:  [phone] Name: MCHLOE MIYOSHIMRN: [id number] of Birth: 3December 15, 1947

## 2018-08-26 ENCOUNTER — Other Ambulatory Visit: Payer: Self-pay

## 2018-08-26 ENCOUNTER — Ambulatory Visit: Payer: PPO

## 2018-08-26 DIAGNOSIS — M6281 Muscle weakness (generalized): Secondary | ICD-10-CM | POA: Diagnosis not present

## 2018-08-26 DIAGNOSIS — R2681 Unsteadiness on feet: Secondary | ICD-10-CM

## 2018-08-26 DIAGNOSIS — R269 Unspecified abnormalities of gait and mobility: Secondary | ICD-10-CM

## 2018-08-26 NOTE — Therapy (Signed)
[place] MAIN [facility] SERVICES [address] [place], [place], [address] Phone: [phone]   Fax:  [phone]  Physical Therapy Treatment  Patient Details  Name: Krista Evans MRN: [id number] Date of Birth: 1945/08/20 Referring Provider (PT): Ramonita Lab   Encounter Date: 08/26/2018  PT End of Session - 08/26/18 0844    Visit Number  13    Number of Visits  16    Date for PT Re-Evaluation  09/10/18    Authorization Type  3/10 PN start 7/29    PT Start Time  0840    PT Stop Time  0925    PT Time Calculation (min)  45 min    Equipment Utilized During Treatment  Gait belt    Activity Tolerance  Patient tolerated treatment well    Behavior During Therapy  [facility] for tasks assessed/performed       Past Medical History:  Diagnosis Date  . Abnormal Q waves on electrocardiogram   . Anemia   . Aortic atherosclerosis ([place])    "I could possibly have it, my grandmother had it"  . Cancer [facility]) 05/2011   Right upper Lung CA with partial Lobectomy.  . Celiac disease   . Celiac disease   . Dyspnea    with exertion  . Hyperlipidemia   . Hyperlipidemia   . Lung mass   . Meningioma ([place])   . Osteoporosis   . Personal history of chemotherapy   . Personal history of radiation therapy     Past Surgical History:  Procedure Laterality Date  . LUNG LOBECTOMY     right lung  . 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  Subjective Assessment - 08/26/18 0843    Subjective  Patient reports no falls since last session. Has been complaint with HEP, reports they are still challenge. No complaints of pain.    Pertinent History  Patient is returning to outpatient physical therapy after closure for COVID. Due to patient's last session being in March, patient is  being re-evaluated today. PMH includes anemia, aortic atherosclerosis, cancer, celiac disease, dyspnea, HLD, meningioma, osteoporosis, history of chemo/radiation therapy, ventriculoperitoneal shunt (07/03/17).    Limitations  Lifting;Standing;Walking;House hold activities;Other (comment)    How long can you sit comfortably?  n/a    How long can you stand comfortably?  5 minutes    How long can you walk comfortably?  400 ft before fatigue/out of breathe.    Currently in Pain?  No/denies      Treatment:  Nustep level 3 for 3 minutes with RPM>60 for cardiovascular challenge/support    Quantum leg press: BLE;  #120 10x ; cueing for keeping RLE neutral due to preference for adduction, cueing for slow velocity for maximal muscle contraction ; 2 sets ; ball between knees to promote neutral alignment and correct muscle activation    Ambulate around gym with horizontal head turns scanning room x 98 ft with CGA no AD   New HEP: Access Code: [email or web address]  URL: [email or web address]  Date: 08/26/2018  Prepared by: Janna Arch   Exercises Standing Heel Raise with Support - 15 reps - 2 sets - 1x daily - 7x weekly Standing Toe Raises at Chair - 15 reps - 2 sets - 1x daily - 7x weekly Mini Squat with Counter Support - 15 reps -  2 sets - 1x daily - 7x weekly Lunge with Counter Support - 15 reps - 2 sets - 1x daily - 7x weekly Standing Tandem Balance with Counter Support - 2 reps - 2 sets - 30 hold - 1x daily - 7x weekly Standing Single Leg Stance with Counter Support - 2 reps - 2 set- 30 hold - 1x daily - 7x weekly     Pt educated throughout session about proper posture and technique with exercises. Improved exercise technique, movement at target joints, use of target muscles after min to mod verbal, visual, tactile cues.   Patient received and performed new HEP program with focus on more dynamic standing strength and stability. Patient educated on need to use counter top support and have  support chair present for safety in home environment. Patient continues to have good motivation throughout session despite need for rest breaks due to fatigue/out of breathe. The patient would benefit from further skilled PT to maximize pt functional activities, safety, and QOL.                          PT Education - 08/26/18 0844    Education provided  Yes    Education Details  exercise technique, body mechanics.    Person(s) Educated  Patient    Methods  Explanation;Demonstration;Tactile cues;Verbal cues    Comprehension  Verbalized understanding;Returned demonstration;Verbal cues required;Tactile cues required       PT Short Term Goals - 08/14/18 1312      PT SHORT TERM GOAL #1   Title  Patient will be independent in HEP demonstrating successful between session carryover and improve functional mobility and ease of completing ADLs.    Baseline  6/30: HEP given next session 7/29: HEP compliant    Time  2    Period  Weeks    Status  Achieved    Target Date  07/30/18      PT SHORT TERM GOAL #2   Title  Patient will be able to transfer sit<>Stand from regular height chair without pushing on arm rests to exhibit improved independence improved functional strength.     Baseline  6/30: requires excessive BUE support 7/20 : able to perform    Time  2    Period  Weeks    Status  Achieved    Target Date  07/30/18        PT Long Term Goals - 08/14/18 1314      PT LONG TERM GOAL #1   Title  Patient will increase six minute walk test distance to >1000 for progression to community ambulator and improve gait ability    Baseline  6/30: 880 ft with rollator 7/29: 882 ft with 3 minutes w/o rollator    Time  8    Period  Weeks    Status  Partially Met    Target Date  09/10/18      PT LONG TERM GOAL #2   Title  Patient (> 43 years old) will complete five times sit to stand test in < 15 seconds without UE support indicating an increased LE strength and improved  balance.    Baseline  6/30: 16 seconds with excessive BUE support 7/29: 15 seconds with SUE support and one LOB    Time  8    Period  Weeks    Status  Partially Met    Target Date  09/10/18      PT LONG TERM GOAL #3  Title  Patient will demonstrate an improved Berg Balance Score of >42/56 as to demonstrate improved balance with ADLs such as sitting/standing and transfer balance and reduced fall risk.     Baseline  6/30: 37/56 7/29: 39/56    Time  8    Period  Weeks    Status  Partially Met    Target Date  09/10/18      PT LONG TERM GOAL #4   Title  Patient will improve LEFS to 54/80 to demonstrate improved fucnctional strenth and mobility and ease of indepedence    Baseline  6/30: 32/80 7/29: 39/80    Time  8    Period  Weeks    Status  Partially Met    Target Date  09/10/18            Plan - 08/26/18 0904    Clinical Impression Statement  Patient received and performed new HEP program with focus on more dynamic standing strength and stability. Patient educated on need to use counter top support and have support chair present for safety in home environment. Patient continues to have good motivation throughout session despite need for rest breaks due to fatigue/out of breathe. The patient would benefit from further skilled PT to maximize pt functional activities, safety, and QOL.    Personal Factors and Comorbidities  Age;Comorbidity 3+;Fitness;Past/Current Experience;Social Background;Time since onset of injury/illness/exacerbation    Comorbidities  anemia, cancer, celiac disease, dyspnea, meningioma, osteoporosis, HLD    Examination-Activity Limitations  Bed Mobility;Bathing;Bend;Carry;Dressing;Hygiene/Grooming;Locomotion Level;Reach Overhead;Sit;Squat;Stairs;Stand;Toileting;Transfers    Examination-Participation Restrictions  Church;Cleaning;Community Activity;Driving;Interpersonal Relationship;Meal Prep;Laundry;Shop;Volunteer;Yard Work    [employer] Potential  Fair    Clinical  Impairments Affecting Rehab Potential  (+) improvement made within last year, eagerness to participate (-) HEP compliance, safety awareness     PT Frequency  2x / week    PT Duration  8 weeks    PT Treatment/Interventions  ADLs/Self Care Home Management;Cryotherapy;Electrical Stimulation;Iontophoresis 53m/ml Dexamethasone;Moist Heat;Ultrasound;DME Instruction;Gait training;Stair training;Balance training;Therapeutic exercise;Therapeutic activities;Functional mobility training;Neuromuscular re-education;Patient/family education;Manual techniques;Passive range of motion;Energy conservation    PT Next Visit Plan  progress strength and stability    PT Home Exercise Plan  standing hip abduction, standing marches, standing hip extension, standing hamstring curls, mini squats    Consulted and Agree with Plan of Care  Patient       Patient will benefit from skilled therapeutic intervention in order to improve the following deficits and impairments:  Abnormal gait, Decreased activity tolerance, Decreased balance, Decreased coordination, Decreased endurance, Decreased mobility, Decreased range of motion, Decreased safety awareness, Decreased strength, Difficulty walking, Impaired flexibility, Impaired perceived functional ability, Improper body mechanics, Postural dysfunction, Pain, Impaired UE functional use, Decreased knowledge of precautions, Cardiopulmonary status limiting activity, Decreased knowledge of use of DME  Visit Diagnosis: 1. Muscle weakness (generalized)   2. Unsteadiness on feet   3. Neurologic gait disorder        Problem List Patient Active Problem List   Diagnosis Date Noted  . Cognitive deficits   . Labile blood pressure   . Acute blood loss anemia   . Slow transit constipation   . Vascular headache   . 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 Hydrocephalus ([place] 07/03/2017  . 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 Pelvic fracture ([place] 04/18/2016  . CD (celiac  disease) 08/04/2014  . Cancer of upper lobe of right lung ([place] 08/04/2014  . Age related osteoporosis 11/26/2013  . Absolute anemia 05/21/2013  . H/O malignant neoplasm 05/21/2013  . HLD (hyperlipidemia)  05/21/2013   Janna Arch, PT, DPT   08/26/2018, 9:21 AM  [place] MAIN [facility] SERVICES [address] [place], [place], [address] Phone: [phone]   Fax:  [phone]  Name: DELILA KUKLINSKI MRN: [id number] Date of Birth: 06-24-1945

## 2018-08-28 ENCOUNTER — Ambulatory Visit: Payer: PPO

## 2018-08-28 ENCOUNTER — Other Ambulatory Visit: Payer: Self-pay

## 2018-08-28 DIAGNOSIS — R2681 Unsteadiness on feet: Secondary | ICD-10-CM

## 2018-08-28 DIAGNOSIS — R269 Unspecified abnormalities of gait and mobility: Secondary | ICD-10-CM

## 2018-08-28 DIAGNOSIS — M6281 Muscle weakness (generalized): Secondary | ICD-10-CM | POA: Diagnosis not present

## 2018-08-28 NOTE — Therapy (Signed)
[place] MAIN [facility] SERVICES [address] [place], [place], [address] Phone: [phone]   Fax:  [phone]  Physical Therapy Treatment  Patient Details  Name: Krista Evans MRN: [id number] Date of Birth: November 01, 1945 Referring Provider (PT): Ramonita Lab   Encounter Date: 08/28/2018  PT End of Session - 08/28/18 1305    Visit Number  14    Number of Visits  16    Date for PT Re-Evaluation  09/10/18    Authorization Type  4/10 PN start 7/29    PT Start Time  1300    PT Stop Time  1344    PT Time Calculation (min)  44 min    Equipment Utilized During Treatment  Gait belt    Activity Tolerance  Patient tolerated treatment well    Behavior During Therapy  [facility] for tasks assessed/performed       Past Medical History:  Diagnosis Date  . Abnormal Q waves on electrocardiogram   . Anemia   . Aortic atherosclerosis ([place])    "I could possibly have it, my grandmother had it"  . Cancer [facility]) 05/2011   Right upper Lung CA with partial Lobectomy.  . Celiac disease   . Celiac disease   . Dyspnea    with exertion  . Hyperlipidemia   . Hyperlipidemia   . Lung mass   . Meningioma ([place])   . Osteoporosis   . Personal history of chemotherapy   . Personal history of radiation therapy     Past Surgical History:  Procedure Laterality Date  . LUNG LOBECTOMY     right lung  . 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  Subjective Assessment - 08/28/18 1304    Subjective  Patient reports her new HEP is challenging and she feels slightly sore. No complaints of pain or LOB since last session .    Pertinent History  Patient is returning to outpatient physical therapy after closure for COVID. Due to patient's last session being in March, patient is being  re-evaluated today. PMH includes anemia, aortic atherosclerosis, cancer, celiac disease, dyspnea, HLD, meningioma, osteoporosis, history of chemo/radiation therapy, ventriculoperitoneal shunt (07/03/17).    Limitations  Lifting;Standing;Walking;House hold activities;Other (comment)    How long can you sit comfortably?  n/a    How long can you stand comfortably?  5 minutes    How long can you walk comfortably?  400 ft before fatigue/out of breathe.    Currently in Pain?  No/denies             Nustep level 3 3 minutes with RPM>60 for cardiovascular challenge/support      Neuro Re-Ed    One foot on airex pad one foot on 6: step hold 30 seconds with one hand flat on support bar x 2 trials each LE  hedghog taps with L and RLE with SUE support for coordination, spatial awareness, x2 minutes   airex: 2lb bar elbows extended straight arm raise 10x  Seated 2" step toe taps for coordination and muscle recruitment with spatial awareness x 30 seconds x 2 trials.   TherEx:   Ambulate in hallway >1000 ft  with no UE support frequent near LOB when stopping ambulation requiring assistance to maintain stability.    4" step toe taps, SUE support 10x each LE  4" step lateral toe taps BUE support 10x each LE   Seated adduction ball squeeze12x  3 second holds; cueing to squeeze with LLE first to initiate muscle activation    Seated LAQ 3 second holds ; 10x        Pt educated throughout session about proper posture and technique with exercises. Improved exercise technique, movement at target joints, use of target muscles after min to mod verbal, visual, tactile cues.                     PT Education - 08/28/18 1305    Education provided  Yes    Education Details  exercise technique, body mechanics    Person(s) Educated  Patient    Methods  Explanation;Demonstration;Tactile cues;Verbal cues    Comprehension  Verbalized understanding;Returned demonstration;Verbal cues  required;Tactile cues required       PT Short Term Goals - 08/14/18 1312      PT SHORT TERM GOAL #1   Title  Patient will be independent in HEP demonstrating successful between session carryover and improve functional mobility and ease of completing ADLs.    Baseline  6/30: HEP given next session 7/29: HEP compliant    Time  2    Period  Weeks    Status  Achieved    Target Date  07/30/18      PT SHORT TERM GOAL #2   Title  Patient will be able to transfer sit<>Stand from regular height chair without pushing on arm rests to exhibit improved independence improved functional strength.     Baseline  6/30: requires excessive BUE support 7/20 : able to perform    Time  2    Period  Weeks    Status  Achieved    Target Date  07/30/18        PT Long Term Goals - 08/14/18 1314      PT LONG TERM GOAL #1   Title  Patient will increase six minute walk test distance to >1000 for progression to community ambulator and improve gait ability    Baseline  6/30: 880 ft with rollator 7/29: 882 ft with 3 minutes w/o rollator    Time  8    Period  Weeks    Status  Partially Met    Target Date  09/10/18      PT LONG TERM GOAL #2   Title  Patient (> 75 years old) will complete five times sit to stand test in < 15 seconds without UE support indicating an increased LE strength and improved balance.    Baseline  6/30: 16 seconds with excessive BUE support 7/29: 15 seconds with SUE support and one LOB    Time  8    Period  Weeks    Status  Partially Met    Target Date  09/10/18      PT LONG TERM GOAL #3   Title  Patient will demonstrate an improved Berg Balance Score of >42/56 as to demonstrate improved balance with ADLs such as sitting/standing and transfer balance and reduced fall risk.     Baseline  6/30: 37/56 7/29: 39/56    Time  8    Period  Weeks    Status  Partially Met    Target Date  09/10/18      PT LONG TERM GOAL #4   Title  Patient will improve LEFS to 54/80 to demonstrate  improved fucnctional strenth and mobility and ease of indepedence  Baseline  6/30: 32/80 7/29: 39/80    Time  8    Period  Weeks    Status  Partially Met    Target Date  09/10/18            Plan - 08/28/18 1333    Clinical Impression Statement  Patient demonstrates improved capacity for ambulation, ambulating further duration without need for seated rest break. Occasional LOB noted with sudden termination of movement with poor ankle righting reactions. The patient would benefit from further skilled PT to maximize pt functional activities, safety, and QOL    Personal Factors and Comorbidities  Age;Comorbidity 3+;Fitness;Past/Current Experience;Social Background;Time since onset of injury/illness/exacerbation    Comorbidities  anemia, cancer, celiac disease, dyspnea, meningioma, osteoporosis, HLD    Examination-Activity Limitations  Bed Mobility;Bathing;Bend;Carry;Dressing;Hygiene/Grooming;Locomotion Level;Reach Overhead;Sit;Squat;Stairs;Stand;Toileting;Transfers    Examination-Participation Restrictions  Church;Cleaning;Community Activity;Driving;Interpersonal Relationship;Meal Prep;Laundry;Shop;Volunteer;Yard Work    [employer] Potential  Fair    Clinical Impairments Affecting Rehab Potential  (+) improvement made within last year, eagerness to participate (-) HEP compliance, safety awareness     PT Frequency  2x / week    PT Duration  8 weeks    PT Treatment/Interventions  ADLs/Self Care Home Management;Cryotherapy;Electrical Stimulation;Iontophoresis 16m/ml Dexamethasone;Moist Heat;Ultrasound;DME Instruction;Gait training;Stair training;Balance training;Therapeutic exercise;Therapeutic activities;Functional mobility training;Neuromuscular re-education;Patient/family education;Manual techniques;Passive range of motion;Energy conservation    PT Next Visit Plan  progress strength and stability    PT Home Exercise Plan  standing hip abduction, standing marches, standing hip extension, standing  hamstring curls, mini squats    Consulted and Agree with Plan of Care  Patient       Patient will benefit from skilled therapeutic intervention in order to improve the following deficits and impairments:  Abnormal gait, Decreased activity tolerance, Decreased balance, Decreased coordination, Decreased endurance, Decreased mobility, Decreased range of motion, Decreased safety awareness, Decreased strength, Difficulty walking, Impaired flexibility, Impaired perceived functional ability, Improper body mechanics, Postural dysfunction, Pain, Impaired UE functional use, Decreased knowledge of precautions, Cardiopulmonary status limiting activity, Decreased knowledge of use of DME  Visit Diagnosis: 1. Muscle weakness (generalized)   2. Unsteadiness on feet   3. Neurologic gait disorder        Problem List Patient Active Problem List   Diagnosis Date Noted  . Cognitive deficits   . Labile blood pressure   . Acute blood loss anemia   . Slow transit constipation   . Vascular headache   . 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 Hydrocephalus ([place] 07/03/2017  . 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 Pelvic fracture ([place] 04/18/2016  . CD (celiac disease) 08/04/2014  . [place] of [place] 08/04/2014  . Age related osteoporosis 11/26/2013  . Absolute anemia 05/21/2013  . H/O malignant neoplasm 05/21/2013  . HLD (hyperlipidemia) 05/21/2013   MJanna Arch PT, DPT   08/28/2018, 1:44 PM  [place] [facility] [address] [address]Phone: [phone]  Fax:  [phone] Name: Krista HONSINGERMRN: [id number] of Birth: [birth year]

## 2018-09-02 ENCOUNTER — Other Ambulatory Visit: Payer: Self-pay

## 2018-09-02 ENCOUNTER — Ambulatory Visit: Payer: PPO

## 2018-09-02 DIAGNOSIS — R2681 Unsteadiness on feet: Secondary | ICD-10-CM

## 2018-09-02 DIAGNOSIS — M6281 Muscle weakness (generalized): Secondary | ICD-10-CM | POA: Diagnosis not present

## 2018-09-02 DIAGNOSIS — R269 Unspecified abnormalities of gait and mobility: Secondary | ICD-10-CM

## 2018-09-02 NOTE — Therapy (Signed)
[place] MAIN [facility] SERVICES [address] [place], [place], [address] Phone: [phone]   Fax:  [phone]  Physical Therapy Treatment  Patient Details  Name: Krista Evans MRN: [id number] Date of Birth: 01-Aug-1945 Referring Provider (PT): Ramonita Lab   Encounter Date: 09/02/2018  PT End of Session - 09/02/18 0838    Visit Number  15    Number of Visits  16    Date for PT Re-Evaluation  09/10/18    Authorization Type  5/10 PN start 7/29    PT Start Time  0831    PT Stop Time  0915    PT Time Calculation (min)  44 min    Equipment Utilized During Treatment  Gait belt    Activity Tolerance  Patient tolerated treatment well    Behavior During Therapy  [facility] for tasks assessed/performed       Past Medical History:  Diagnosis Date  . Abnormal Q waves on electrocardiogram   . Anemia   . Aortic atherosclerosis ([place])    "I could possibly have it, my grandmother had it"  . Cancer [facility]) 05/2011   Right upper Lung CA with partial Lobectomy.  . Celiac disease   . Celiac disease   . Dyspnea    with exertion  . Hyperlipidemia   . Hyperlipidemia   . Lung mass   . Meningioma ([place])   . Osteoporosis   . Personal history of chemotherapy   . Personal history of radiation therapy     Past Surgical History:  Procedure Laterality Date  . LUNG LOBECTOMY     right lung  . 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  Subjective Assessment - 09/02/18 0837    Subjective  Patient reports no falls or LOB since last session. Had a quiet weekend, completed all her HEP, enjoys all the exercises except the lunges.    Pertinent History  Patient is returning to outpatient physical therapy after closure for COVID. Due to patient's last session being in March,  patient is being re-evaluated today. PMH includes anemia, aortic atherosclerosis, cancer, celiac disease, dyspnea, HLD, meningioma, osteoporosis, history of chemo/radiation therapy, ventriculoperitoneal shunt (07/03/17).    Limitations  Lifting;Standing;Walking;House hold activities;Other (comment)    How long can you sit comfortably?  n/a    How long can you stand comfortably?  5 minutes    How long can you walk comfortably?  400 ft before fatigue/out of breathe.    Currently in Pain?  No/denies           Nustep level 3 3 minutes with RPM>60 for cardiovascular challenge/support      Neuro Re-Ed  Modifed lunges BUE support 7x each LE; very challenging with LLE.    One foot on airex pad one foot on 6: step hold 30 seconds with one hand flat on support bar x 2 trials each LE   airex: 3b bar elbows extended straight arm raise 10x;   Airex: 3lb bar press 10x; one near LOB,   Airex: 3lb bicep curls 10x      TherEx:   Ambulate in hallway >1000 ft  with no UE support frequent near LOB when stopping ambulation requiring assistance to maintain stability.   RTB abduction 10x 3 second holds, focus on LLE movement/muscle activation.   RTB hamstring  curl 12x each LE, PT resistance.    Seated adduction ball squeeze12x  3 second holds; cueing to squeeze with LLE first to initiate muscle activation    Seated LAQ 3 second holds ; 10x     seated straight leg abduction/adduction 10x each LE,    Seated modified windmill 5x each side, challenging for LUE coordination   Pt educated throughout session about proper posture and technique with exercises. Improved exercise technique, movement at target joints, use of target muscles after min to mod verbal, visual, tactile cues.                        PT Education - 09/02/18 0837    Education provided  Yes    Education Details  exercise technique, body mechanics    Person(s) Educated  Patient    Methods   Explanation;Demonstration;Tactile cues;Verbal cues    Comprehension  Verbalized understanding;Returned demonstration;Verbal cues required;Tactile cues required       PT Short Term Goals - 08/14/18 1312      PT SHORT TERM GOAL #1   Title  Patient will be independent in HEP demonstrating successful between session carryover and improve functional mobility and ease of completing ADLs.    Baseline  6/30: HEP given next session 7/29: HEP compliant    Time  2    Period  Weeks    Status  Achieved    Target Date  07/30/18      PT SHORT TERM GOAL #2   Title  Patient will be able to transfer sit<>Stand from regular height chair without pushing on arm rests to exhibit improved independence improved functional strength.     Baseline  6/30: requires excessive BUE support 7/20 : able to perform    Time  2    Period  Weeks    Status  Achieved    Target Date  07/30/18        PT Long Term Goals - 08/14/18 1314      PT LONG TERM GOAL #1   Title  Patient will increase six minute walk test distance to >1000 for progression to community ambulator and improve gait ability    Baseline  6/30: 880 ft with rollator 7/29: 882 ft with 3 minutes w/o rollator    Time  8    Period  Weeks    Status  Partially Met    Target Date  09/10/18      PT LONG TERM GOAL #2   Title  Patient (> 74 years old) will complete five times sit to stand test in < 15 seconds without UE support indicating an increased LE strength and improved balance.    Baseline  6/30: 16 seconds with excessive BUE support 7/29: 15 seconds with SUE support and one LOB    Time  8    Period  Weeks    Status  Partially Met    Target Date  09/10/18      PT LONG TERM GOAL #3   Title  Patient will demonstrate an improved Berg Balance Score of >42/56 as to demonstrate improved balance with ADLs such as sitting/standing and transfer balance and reduced fall risk.     Baseline  6/30: 37/56 7/29: 39/56    Time  8    Period  Weeks    Status   Partially Met    Target Date  09/10/18      PT LONG TERM GOAL #4   Title  Patient will improve LEFS to 54/80 to demonstrate improved fucnctional strenth and mobility and ease of indepedence    Baseline  6/30: 32/80 7/29: 39/80    Time  8    Period  Weeks    Status  Partially Met    Target Date  09/10/18            Plan - 09/02/18 0905    Clinical Impression Statement  Patient progressing with functional strength and stability. She continues to be fearful of balance interventions without UE support but is agreeable to single hand or hand flat progressions with CGA from PT. Patient demonstrates less instability on unstable surface with decreased reliance upon UE. The patient would benefit from further skilled PT to maximize pt functional activities, safety, and QOL    Personal Factors and Comorbidities  Age;Comorbidity 3+;Fitness;Past/Current Experience;Social Background;Time since onset of injury/illness/exacerbation    Comorbidities  anemia, cancer, celiac disease, dyspnea, meningioma, osteoporosis, HLD    Examination-Activity Limitations  Bed Mobility;Bathing;Bend;Carry;Dressing;Hygiene/Grooming;Locomotion Level;Reach Overhead;Sit;Squat;Stairs;Stand;Toileting;Transfers    Examination-Participation Restrictions  Church;Cleaning;Community Activity;Driving;Interpersonal Relationship;Meal Prep;Laundry;Shop;Volunteer;Yard Work    [employer] Potential  Fair    Clinical Impairments Affecting Rehab Potential  (+) improvement made within last year, eagerness to participate (-) HEP compliance, safety awareness     PT Frequency  2x / week    PT Duration  8 weeks    PT Treatment/Interventions  ADLs/Self Care Home Management;Cryotherapy;Electrical Stimulation;Iontophoresis 41m/ml Dexamethasone;Moist Heat;Ultrasound;DME Instruction;Gait training;Stair training;Balance training;Therapeutic exercise;Therapeutic activities;Functional mobility training;Neuromuscular re-education;Patient/family education;Manual  techniques;Passive range of motion;Energy conservation    PT Next Visit Plan  progress strength and stability    PT Home Exercise Plan  standing hip abduction, standing marches, standing hip extension, standing hamstring curls, mini squats    Consulted and Agree with Plan of Care  Patient       Patient will benefit from skilled therapeutic intervention in order to improve the following deficits and impairments:  Abnormal gait, Decreased activity tolerance, Decreased balance, Decreased coordination, Decreased endurance, Decreased mobility, Decreased range of motion, Decreased safety awareness, Decreased strength, Difficulty walking, Impaired flexibility, Impaired perceived functional ability, Improper body mechanics, Postural dysfunction, Pain, Impaired UE functional use, Decreased knowledge of precautions, Cardiopulmonary status limiting activity, Decreased knowledge of use of DME  Visit Diagnosis: 1. Muscle weakness (generalized)   2. Unsteadiness on feet   3. Neurologic gait disorder        Problem List Patient Active Problem List   Diagnosis Date Noted  . Cognitive deficits   . Labile blood pressure   . Acute blood loss anemia   . Slow transit constipation   . Vascular headache   . 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 Hydrocephalus ([place] 07/03/2017  . 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 Pelvic fracture ([place] 04/18/2016  . CD (celiac disease) 08/04/2014  . Cancer of [place] lobe of right lung ([place] 08/04/2014  . Age related osteoporosis 11/26/2013  . Absolute anemia 05/21/2013  . H/O malignant neoplasm 05/21/2013  . HLD (hyperlipidemia) 05/21/2013   MJanna Arch PT, DPT   09/02/2018, 9:20 AM  [place] [facility] [address] [address]Phone: [phone]  Fax:  [phone] Name: MAHMANI DAOUDMRN: [id number] of Birth: [birth year]

## 2018-09-04 ENCOUNTER — Ambulatory Visit: Payer: PPO

## 2018-09-04 ENCOUNTER — Other Ambulatory Visit: Payer: Self-pay

## 2018-09-04 DIAGNOSIS — R2681 Unsteadiness on feet: Secondary | ICD-10-CM

## 2018-09-04 DIAGNOSIS — M6281 Muscle weakness (generalized): Secondary | ICD-10-CM | POA: Diagnosis not present

## 2018-09-04 DIAGNOSIS — R269 Unspecified abnormalities of gait and mobility: Secondary | ICD-10-CM

## 2018-09-04 NOTE — Therapy (Signed)
[place] MAIN [facility] SERVICES [address] [place], [place], [address] Phone: [phone]   Fax:  [phone]  Physical Therapy Treatment/ RECERT  Patient Details  Name: Krista Evans MRN: [id number] Date of Birth: 1945/03/17 Referring Provider (PT): Ramonita Lab   Encounter Date: 09/04/2018  PT End of Session - 09/04/18 0909    Visit Number  16    Number of Visits  32    Date for PT Re-Evaluation  10/30/18    Authorization Type  6/10 PN start 7/29    PT Start Time  0844    PT Stop Time  0928    PT Time Calculation (min)  44 min    Equipment Utilized During Treatment  Gait belt    Activity Tolerance  Patient tolerated treatment well    Behavior During Therapy  [facility] for tasks assessed/performed       Past Medical History:  Diagnosis Date  . Abnormal Q waves on electrocardiogram   . Anemia   . Aortic atherosclerosis ([place])    "I could possibly have it, my grandmother had it"  . Cancer [facility]) 05/2011   Right upper Lung CA with partial Lobectomy.  . Celiac disease   . Celiac disease   . Dyspnea    with exertion  . Hyperlipidemia   . Hyperlipidemia   . Lung mass   . Meningioma ([place])   . Osteoporosis   . Personal history of chemotherapy   . Personal history of radiation therapy     Past Surgical History:  Procedure Laterality Date  . LUNG LOBECTOMY     right lung  . 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  Subjective Assessment - 09/04/18 0850    Subjective  Patient reports compliance with HEP. Feeling more steady at home, no falls or LOB since last session.    Pertinent History  Patient is returning to outpatient physical therapy after closure for COVID. Due to patient's last session being in March, patient is being re-evaluated  today. PMH includes anemia, aortic atherosclerosis, cancer, celiac disease, dyspnea, HLD, meningioma, osteoporosis, history of chemo/radiation therapy, ventriculoperitoneal shunt (07/03/17).    Limitations  Lifting;Standing;Walking;House hold activities;Other (comment)    How long can you sit comfortably?  n/a    How long can you stand comfortably?  5 minutes    How long can you walk comfortably?  400 ft before fatigue/out of breathe.    Currently in Pain?  No/denies         Goals: 6 MWT: 880 ft without AD 5x STS: 14 SUE support; no LOB BERG: 42/56  LEFS: 41/80   Treatment:  sit to stands without UE support, cueing for positioning at end of chair, forward reach and gluteal squeeze. Able to perform two consecutive ones prior to needing rest breaks     Patient is progressing towards functional goals with improved gait mechanics, mobility, and stability. Patient is able to tap top of box with CGA for first time without UE support this session. She is improving in her ability to transfer performing 5x STS in less than 15 seconds with SUE support. She additionally is able to perform two sit to stands without UE support. Patient is able to ambulate approximately the same distance performed last 6 minute walk test however is improving in independence  with ambulation and capacity for walking without AD as she performed without using her rollator. The patient would benefit from further skilled PT to maximize pt functional activities, safety, and QOL             PT Education - 09/04/18 0852    Education provided  Yes    Education Details  goals, POC    Person(s) Educated  Patient    Methods  Explanation;Demonstration;Tactile cues;Verbal cues    Comprehension  Verbalized understanding;Other (comment);Returned demonstration;Verbal cues required;Tactile cues required       PT Short Term Goals - 09/04/18 0909      PT SHORT TERM GOAL #1   Title  Patient will be independent in HEP  demonstrating successful between session carryover and improve functional mobility and ease of completing ADLs.    Baseline  6/30: HEP given next session 7/29: HEP compliant    Time  2    Period  Weeks    Status  Achieved    Target Date  07/30/18      PT SHORT TERM GOAL #2   Title  Patient will be able to transfer sit<>Stand from regular height chair without pushing on arm rests to exhibit improved independence improved functional strength.     Baseline  6/30: requires excessive BUE support 7/20 : able to perform    Time  2    Period  Weeks    Status  Achieved    Target Date  07/30/18        PT Long Term Goals - 09/04/18 0905      PT LONG TERM GOAL #1   Title  Patient will increase six minute walk test distance to >1000 for progression to community ambulator and improve gait ability    Baseline  6/30: 880 ft with rollator 7/29: 882 ft with 3 minutes w/o rollator 8/19: 880 ft without AD    Time  8    Period  Weeks    Status  Partially Met    Target Date  10/30/18      PT LONG TERM GOAL #2   Title  Patient (> 17 years old) will complete five times sit to stand test in < 15 seconds without UE support indicating an increased LE strength and improved balance.    Baseline  6/30: 16 seconds with excessive BUE support 7/29: 15 seconds with SUE support and one LOB 8/19: 14 seconds with SUE support.    Time  8    Period  Weeks    Status  Partially Met    Target Date  10/30/18      PT LONG TERM GOAL #3   Title  Patient will demonstrate an improved Berg Balance Score of >42/56 as to demonstrate improved balance with ADLs such as sitting/standing and transfer balance and reduced fall risk.     Baseline  6/30: 37/56 7/29: 39/56 8/19:  42/56    Time  8    Period  Weeks    Status  Achieved      PT LONG TERM GOAL #4   Title  Patient will improve LEFS to 54/80 to demonstrate improved fucnctional strenth and mobility and ease of indepedence    Baseline  6/30: 32/80 7/29: 39/80 8/19: 41/80     Time  8    Period  Weeks    Status  Partially Met    Target Date  10/30/18      PT LONG TERM GOAL #5   Title  Patient will demonstrate an improved Berg Balance Score of >48/56 as to demonstrate improved balance with ADLs such as sitting/standing and transfer balance and reduced fall risk.    Baseline  8/19: 42/56    Time  8    Period  Weeks    Status  New    Target Date  10/30/18            Plan - 09/04/18 2671    Clinical Impression Statement  Patient is progressing towards functional goals with improved gait mechanics, mobility, and stability. Patient is able to tap top of box with CGA for first time without UE support this session. She is improving in her ability to transfer performing 5x STS in less than 15 seconds with SUE support. She additionally is able to perform two sit to stands without UE support. Patient is able to ambulate approximately the same distance performed last 6 minute walk test however is improving in independence with ambulation and capacity for walking without AD as she performed without using her rollator. The patient would benefit from further skilled PT to maximize pt functional activities, safety, and QOL    Personal Factors and Comorbidities  Age;Comorbidity 3+;Fitness;Past/Current Experience;Social Background;Time since onset of injury/illness/exacerbation    Comorbidities  anemia, cancer, celiac disease, dyspnea, meningioma, osteoporosis, HLD    Examination-Activity Limitations  Bed Mobility;Bathing;Bend;Carry;Dressing;Hygiene/Grooming;Locomotion Level;Reach Overhead;Sit;Squat;Stairs;Stand;Toileting;Transfers    Examination-Participation Restrictions  Church;Cleaning;Community Activity;Driving;Interpersonal Relationship;Meal Prep;Laundry;Shop;Volunteer;Yard Work    [employer] Potential  Fair    Clinical Impairments Affecting Rehab Potential  (+) improvement made within last year, eagerness to participate (-) HEP compliance, safety awareness     PT Frequency   2x / week    PT Duration  8 weeks    PT Treatment/Interventions  ADLs/Self Care Home Management;Cryotherapy;Electrical Stimulation;Iontophoresis 60m/ml Dexamethasone;Moist Heat;Ultrasound;DME Instruction;Gait training;Stair training;Balance training;Therapeutic exercise;Therapeutic activities;Functional mobility training;Neuromuscular re-education;Patient/family education;Manual techniques;Passive range of motion;Energy conservation    PT Next Visit Plan  progress strength and stability    PT Home Exercise Plan  standing hip abduction, standing marches, standing hip extension, standing hamstring curls, mini squats    Consulted and Agree with Plan of Care  Patient       Patient will benefit from skilled therapeutic intervention in order to improve the following deficits and impairments:  Abnormal gait, Decreased activity tolerance, Decreased balance, Decreased coordination, Decreased endurance, Decreased mobility, Decreased range of motion, Decreased safety awareness, Decreased strength, Difficulty walking, Impaired flexibility, Impaired perceived functional ability, Improper body mechanics, Postural dysfunction, Pain, Impaired UE functional use, Decreased knowledge of precautions, Cardiopulmonary status limiting activity, Decreased knowledge of use of DME  Visit Diagnosis: 1. Muscle weakness (generalized)   2. Unsteadiness on feet   3. Neurologic gait disorder        Problem List Patient Active Problem List   Diagnosis Date Noted  . Cognitive deficits   . Labile blood pressure   . Acute blood loss anemia   . Slow transit constipation   . Vascular headache   . 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 Hydrocephalus ([place] 07/03/2017  . 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 Pelvic fracture ([place] 04/18/2016  . CD (celiac disease) 08/04/2014  . Cancer of [place] lobe of right lung ([place] 08/04/2014  . Age related osteoporosis 11/26/2013  . Absolute anemia 05/21/2013  . H/O  malignant neoplasm 05/21/2013  . HLD (hyperlipidemia) 05/21/2013   MJanna Arch PT, DPT   09/04/2018, 9:29 AM  [place] [address]  [place], [address] Phone: [phone]   Fax:  [phone]  Name: Krista Evans MRN: [id number] Date of Birth: 1945/08/12

## 2018-09-09 ENCOUNTER — Other Ambulatory Visit: Payer: Self-pay

## 2018-09-09 ENCOUNTER — Ambulatory Visit: Payer: PPO

## 2018-09-09 DIAGNOSIS — R269 Unspecified abnormalities of gait and mobility: Secondary | ICD-10-CM

## 2018-09-09 DIAGNOSIS — R2681 Unsteadiness on feet: Secondary | ICD-10-CM

## 2018-09-09 DIAGNOSIS — M6281 Muscle weakness (generalized): Secondary | ICD-10-CM | POA: Diagnosis not present

## 2018-09-09 NOTE — Therapy (Signed)
[place] MAIN [facility] SERVICES [address] [place], [place], [address] Phone: [phone]   Fax:  [phone]  Physical Therapy Treatment  Patient Details  Name: Krista Evans MRN: [id number] Date of Birth: 1945/06/11 Referring Provider (PT): Ramonita Lab   Encounter Date: 09/09/2018  PT End of Session - 09/09/18 0913    Visit Number  17    Number of Visits  32    Date for PT Re-Evaluation  10/30/18    Authorization Type  7/10 PN start 7/29    PT Start Time  0845    PT Stop Time  0929    PT Time Calculation (min)  44 min    Equipment Utilized During Treatment  Gait belt    Activity Tolerance  Patient tolerated treatment well    Behavior During Therapy  [facility] for tasks assessed/performed       Past Medical History:  Diagnosis Date  . Abnormal Q waves on electrocardiogram   . Anemia   . Aortic atherosclerosis ([place])    "I could possibly have it, my grandmother had it"  . Cancer [facility]) 05/2011   Right upper Lung CA with partial Lobectomy.  . Celiac disease   . Celiac disease   . Dyspnea    with exertion  . Hyperlipidemia   . Hyperlipidemia   . Lung mass   . Meningioma ([place])   . Osteoporosis   . Personal history of chemotherapy   . Personal history of radiation therapy     Past Surgical History:  Procedure Laterality Date  . LUNG LOBECTOMY     right lung  . 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  Subjective Assessment - 09/09/18 0853    Subjective  Patient reports compliance with HEP. No falls or LOB since last session.    Pertinent History  Patient is returning to outpatient physical therapy after closure for COVID. Due to patient's last session being in March, patient is being re-evaluated today. PMH includes anemia, aortic  atherosclerosis, cancer, celiac disease, dyspnea, HLD, meningioma, osteoporosis, history of chemo/radiation therapy, ventriculoperitoneal shunt (07/03/17).    Limitations  Lifting;Standing;Walking;House hold activities;Other (comment)    How long can you sit comfortably?  n/a    How long can you stand comfortably?  5 minutes    How long can you walk comfortably?  400 ft before fatigue/out of breathe.    Currently in Pain?  No/denies               Neuro Re-Ed  Modifed lunges BUE support x 10x each LE; very challenging with LLE. cues for upright posture   One foot on airex pad one foot on 6: step hold 30 seconds with one hand flat on support bar x 2 trials each LE   airex pad: 6" step toe taps SUE support 10x each Le   airex: 3b bar elbows extended straight arm raise 10x;    Airex: 3lb bar press 10x; one near LOB,    Ambulate in hallway with red light/green light for initiation/termination of ambulation withnoUE support; less frequent near LOB when stopping ambulation than previous attempts.     TherEx:  Quantum leg press: BLE; #12010x ; cueing for keeping RLE neutral due to preference for adduction, cueing for slow velocity for maximal muscle contraction; 2 sets;  ball between knees to promote neutral alignment and correct muscle activation  Quantum leg press:SLE #45 each LE, 10x 2 sets cueing for keeping RLE neutral due to preference for adduction, cueing for slow velocity for maximal muscle contraction; 2 sets   Seated adduction ball squeeze12x  3 second holds; cueing to squeeze with LLE first to initiate muscle activation       seated straight leg abduction/adduction 10x each LE,    Seated balloon taps reaching inside/outside BOS with upright posture for core stability.      Pt educated throughout session about proper posture and technique with exercises. Improved exercise technique, movement at target joints, use of target muscles after min to mod verbal, visual,  tactile cues.                      PT Education - 09/09/18 0855    Education provided  Yes    Education Details  exercise technique, stability, body mechanics    Person(s) Educated  Patient    Methods  Explanation;Demonstration;Tactile cues;Verbal cues    Comprehension  Verbalized understanding;Returned demonstration;Verbal cues required;Tactile cues required       PT Short Term Goals - 09/04/18 0909      PT SHORT TERM GOAL #1   Title  Patient will be independent in HEP demonstrating successful between session carryover and improve functional mobility and ease of completing ADLs.    Baseline  6/30: HEP given next session 7/29: HEP compliant    Time  2    Period  Weeks    Status  Achieved    Target Date  07/30/18      PT SHORT TERM GOAL #2   Title  Patient will be able to transfer sit<>Stand from regular height chair without pushing on arm rests to exhibit improved independence improved functional strength.     Baseline  6/30: requires excessive BUE support 7/20 : able to perform    Time  2    Period  Weeks    Status  Achieved    Target Date  07/30/18        PT Long Term Goals - 09/04/18 0905      PT LONG TERM GOAL #1   Title  Patient will increase six minute walk test distance to >1000 for progression to community ambulator and improve gait ability    Baseline  6/30: 880 ft with rollator 7/29: 882 ft with 3 minutes w/o rollator 8/19: 880 ft without AD    Time  8    Period  Weeks    Status  Partially Met    Target Date  10/30/18      PT LONG TERM GOAL #2   Title  Patient (> 37 years old) will complete five times sit to stand test in < 15 seconds without UE support indicating an increased LE strength and improved balance.    Baseline  6/30: 16 seconds with excessive BUE support 7/29: 15 seconds with SUE support and one LOB 8/19: 14 seconds with SUE support.    Time  8    Period  Weeks    Status  Partially Met    Target Date  10/30/18      PT LONG TERM  GOAL #3   Title  Patient will demonstrate an improved Berg Balance Score of >42/56 as to demonstrate improved balance with ADLs such as sitting/standing and transfer balance and reduced fall risk.     Baseline  6/30: 37/56  7/29: 39/56 8/19:  42/56    Time  8    Period  Weeks    Status  Achieved      PT LONG TERM GOAL #4   Title  Patient will improve LEFS to 54/80 to demonstrate improved fucnctional strenth and mobility and ease of indepedence    Baseline  6/30: 32/80 7/29: 39/80 8/19: 41/80    Time  8    Period  Weeks    Status  Partially Met    Target Date  10/30/18      PT LONG TERM GOAL #5   Title  Patient will demonstrate an improved Berg Balance Score of >48/56 as to demonstrate improved balance with ADLs such as sitting/standing and transfer balance and reduced fall risk.    Baseline  8/19: 42/56    Time  8    Period  Weeks    Status  New    Target Date  10/30/18            Plan - 09/09/18 0914    Clinical Impression Statement  Patient presents with good motivation to physical therapy session. Is limited in ability to maintain stability without UE support due to fear of LOB. Sudden termination of mobility is still challenging to patient with poor ankle reactions resulting in near LOB when performing; however decreased episodes of this LOB occurred this session indicating improved ability to react and maintain COM. The patient would benefit from further skilled PT to maximize pt functional activities, safety, and QOL    Personal Factors and Comorbidities  Age;Comorbidity 3+;Fitness;Past/Current Experience;Social Background;Time since onset of injury/illness/exacerbation    Comorbidities  anemia, cancer, celiac disease, dyspnea, meningioma, osteoporosis, HLD    Examination-Activity Limitations  Bed Mobility;Bathing;Bend;Carry;Dressing;Hygiene/Grooming;Locomotion Level;Reach Overhead;Sit;Squat;Stairs;Stand;Toileting;Transfers    Examination-Participation Restrictions   Church;Cleaning;Community Activity;Driving;Interpersonal Relationship;Meal Prep;Laundry;Shop;Volunteer;Yard Work    [employer] Potential  Fair    Clinical Impairments Affecting Rehab Potential  (+) improvement made within last year, eagerness to participate (-) HEP compliance, safety awareness     PT Frequency  2x / week    PT Duration  8 weeks    PT Treatment/Interventions  ADLs/Self Care Home Management;Cryotherapy;Electrical Stimulation;Iontophoresis 74m/ml Dexamethasone;Moist Heat;Ultrasound;DME Instruction;Gait training;Stair training;Balance training;Therapeutic exercise;Therapeutic activities;Functional mobility training;Neuromuscular re-education;Patient/family education;Manual techniques;Passive range of motion;Energy conservation    PT Next Visit Plan  progress strength and stability    PT Home Exercise Plan  standing hip abduction, standing marches, standing hip extension, standing hamstring curls, mini squats    Consulted and Agree with Plan of Care  Patient       Patient will benefit from skilled therapeutic intervention in order to improve the following deficits and impairments:  Abnormal gait, Decreased activity tolerance, Decreased balance, Decreased coordination, Decreased endurance, Decreased mobility, Decreased range of motion, Decreased safety awareness, Decreased strength, Difficulty walking, Impaired flexibility, Impaired perceived functional ability, Improper body mechanics, Postural dysfunction, Pain, Impaired UE functional use, Decreased knowledge of precautions, Cardiopulmonary status limiting activity, Decreased knowledge of use of DME  Visit Diagnosis: Muscle weakness (generalized)  Unsteadiness on feet  Neurologic gait disorder     Problem List Patient Active Problem List   Diagnosis Date Noted  . Cognitive deficits   . Labile blood pressure   . Acute blood loss anemia   . Slow transit constipation   . Vascular headache   . 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 Hydrocephalus ([place] 07/03/2017  . 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 Pelvic fracture ([place] 04/18/2016  . CD (celiac disease) 08/04/2014  .  Cancer of upper lobe of right lung ([place]) 08/04/2014  . Age related osteoporosis 11/26/2013  . Absolute anemia 05/21/2013  . H/O malignant neoplasm 05/21/2013  . HLD (hyperlipidemia) 05/21/2013   Janna Arch, PT, DPT   09/09/2018, 9:41 AM  [place] MAIN [facility] SERVICES [address] [place], [place], [address] Phone: [phone]   Fax:  [phone]  Name: Krista Evans MRN: [id number] Date of Birth: 1945/10/20

## 2018-09-11 ENCOUNTER — Ambulatory Visit: Payer: PPO

## 2018-09-11 ENCOUNTER — Other Ambulatory Visit: Payer: Self-pay

## 2018-09-11 DIAGNOSIS — M6281 Muscle weakness (generalized): Secondary | ICD-10-CM

## 2018-09-11 DIAGNOSIS — R2681 Unsteadiness on feet: Secondary | ICD-10-CM

## 2018-09-11 DIAGNOSIS — R269 Unspecified abnormalities of gait and mobility: Secondary | ICD-10-CM

## 2018-09-11 NOTE — Therapy (Signed)
[place] MAIN [facility] SERVICES [address] [place], [place], [address] Phone: [phone]   Fax:  [phone]  Physical Therapy Treatment  Patient Details  Name: Krista Evans MRN: [id number] Date of Birth: Dec 16, 1945 Referring Provider (PT): Ramonita Lab   Encounter Date: 09/11/2018  PT End of Session - 09/11/18 0838    Visit Number  18    Number of Visits  32    Date for PT Re-Evaluation  10/30/18    Authorization Type  8/10 PN start 7/29    PT Start Time  0835    PT Stop Time  0917    PT Time Calculation (min)  42 min    Equipment Utilized During Treatment  Gait belt    Activity Tolerance  Patient tolerated treatment well    Behavior During Therapy  [facility] for tasks assessed/performed       Past Medical History:  Diagnosis Date  . Abnormal Q waves on electrocardiogram   . Anemia   . Aortic atherosclerosis ([place])    "I could possibly have it, my grandmother had it"  . Cancer [facility]) 05/2011   Right upper Lung CA with partial Lobectomy.  . Celiac disease   . Celiac disease   . Dyspnea    with exertion  . Hyperlipidemia   . Hyperlipidemia   . Lung mass   . Meningioma ([place])   . Osteoporosis   . Personal history of chemotherapy   . Personal history of radiation therapy     Past Surgical History:  Procedure Laterality Date  . LUNG LOBECTOMY     right lung  . 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  Subjective Assessment - 09/11/18 0838    Subjective  Patient reports compliance with HEP, has a question about the lunges. No falls or LOB since last session.    Pertinent History  Patient is returning to outpatient physical therapy after closure for COVID. Due to patient's last session being in March, patient is being re-evaluated today.  PMH includes anemia, aortic atherosclerosis, cancer, celiac disease, dyspnea, HLD, meningioma, osteoporosis, history of chemo/radiation therapy, ventriculoperitoneal shunt (07/03/17).    Limitations  Lifting;Standing;Walking;House hold activities;Other (comment)    How long can you sit comfortably?  n/a    How long can you stand comfortably?  5 minutes    How long can you walk comfortably?  400 ft before fatigue/out of breathe.    Currently in Pain?  No/denies               TherEx:  Quantum leg press: BLE;  #105 15x ; cueing for keeping RLE neutral due to preference for adduction, cueing for slow velocity for maximal muscle contraction ; 2 sets ; ball between knees to promote neutral alignment and correct muscle activation    Quantum leg press:SLE #45 LLE, 10x 2 sets with second set #60 cueing for keeping RLE neutral due to preference for adduction, cueing for slow velocity for maximal muscle contraction ;     Ambulate in hallway>1000 ftwithnoUE support one near LOB when stopping ambulation requiring assistance to maintain stability; no standing rest breaks required for the first time.  Improved gait mechanics and arm swing. ; CGA with cueing for direction and posture.   Seated adduction ball squeeze12x  3 second holds; cueing to  squeeze with LLE first to initiate muscle activation    1lb dumbbells punch cross body for coordination and trunk stability.  60 seconds   1lb dumbbell medial deltoid abduction seated for upright posture. 10x   Seated modified windmills 8x each side.    Weighted ball (2000 gr) arc to opposite knee in seated position for trunk strengthening.     Pt educated throughout session about proper posture and technique with exercises. Improved exercise technique, movement at target joints, use of target muscles after min to mod verbal, visual, tactile cues.                      PT Education - 09/11/18 [phone]    Education provided  Yes     Education Details  exercise technique, stability, body mechanics    Person(s) Educated  Patient    Methods  Explanation;Demonstration;Tactile cues;Verbal cues    Comprehension  Verbalized understanding;Returned demonstration;Verbal cues required;Tactile cues required       PT Short Term Goals - 09/04/18 0909      PT SHORT TERM GOAL #1   Title  Patient will be independent in HEP demonstrating successful between session carryover and improve functional mobility and ease of completing ADLs.    Baseline  6/30: HEP given next session 7/29: HEP compliant    Time  2    Period  Weeks    Status  Achieved    Target Date  07/30/18      PT SHORT TERM GOAL #2   Title  Patient will be able to transfer sit<>Stand from regular height chair without pushing on arm rests to exhibit improved independence improved functional strength.     Baseline  6/30: requires excessive BUE support 7/20 : able to perform    Time  2    Period  Weeks    Status  Achieved    Target Date  07/30/18        PT Long Term Goals - 09/04/18 0905      PT LONG TERM GOAL #1   Title  Patient will increase six minute walk test distance to >1000 for progression to community ambulator and improve gait ability    Baseline  6/30: 880 ft with rollator 7/29: 882 ft with 3 minutes w/o rollator 8/19: 880 ft without AD    Time  8    Period  Weeks    Status  Partially Met    Target Date  10/30/18      PT LONG TERM GOAL #2   Title  Patient (73 years old) will complete five times sit to stand test in < 15 seconds without UE support indicating an increased LE strength and improved balance.    Baseline  6/30: 16 seconds with excessive BUE support 7/29: 15 seconds with SUE support and one LOB 8/19: 14 seconds with SUE support.    Time  8    Period  Weeks    Status  Partially Met    Target Date  10/30/18      PT LONG TERM GOAL #3   Title  Patient will demonstrate an improved Berg Balance Score of >42/56 as to demonstrate improved  balance with ADLs such as sitting/standing and transfer balance and reduced fall risk.     Baseline  6/30: 37/56 7/29: 39/56 8/19:  42/56    Time  8    Period  Weeks    Status  Achieved      PT  LONG TERM GOAL #4   Title  Patient will improve LEFS to 54/80 to demonstrate improved fucnctional strenth and mobility and ease of indepedence    Baseline  6/30: 32/80 7/29: 39/80 8/19: 41/80    Time  8    Period  Weeks    Status  Partially Met    Target Date  10/30/18      PT LONG TERM GOAL #5   Title  Patient will demonstrate an improved Berg Balance Score of >48/56 as to demonstrate improved balance with ADLs such as sitting/standing and transfer balance and reduced fall risk.    Baseline  8/19: 42/56    Time  8    Period  Weeks    Status  New    Target Date  10/30/18            Plan - 09/11/18 0853    Clinical Impression Statement  Patient presents to physical therapy with good motivation. Is improving with her ability to ambulate in more crowded/obstacle ridden pathways with less episodes of LOB and improved velocity during walking.  Patient is improving her arm swing allowing for more equal step length and gait mechanics during ambulation, however does have decreased gait mechanisms with fatigue. The patient would benefit from further skilled PT to maximize pt functional activities, safety, and QOL    Personal Factors and Comorbidities  Age;Comorbidity 3+;Fitness;Past/Current Experience;Social Background;Time since onset of injury/illness/exacerbation    Comorbidities  anemia, cancer, celiac disease, dyspnea, meningioma, osteoporosis, HLD    Examination-Activity Limitations  Bed Mobility;Bathing;Bend;Carry;Dressing;Hygiene/Grooming;Locomotion Level;Reach Overhead;Sit;Squat;Stairs;Stand;Toileting;Transfers    Examination-Participation Restrictions  Church;Cleaning;Community Activity;Driving;Interpersonal Relationship;Meal Prep;Laundry;Shop;Volunteer;Yard Work    [employer] Potential  Fair     Clinical Impairments Affecting Rehab Potential  (+) improvement made within last year, eagerness to participate (-) HEP compliance, safety awareness     PT Frequency  2x / week    PT Duration  8 weeks    PT Treatment/Interventions  ADLs/Self Care Home Management;Cryotherapy;Electrical Stimulation;Iontophoresis 60m/ml Dexamethasone;Moist Heat;Ultrasound;DME Instruction;Gait training;Stair training;Balance training;Therapeutic exercise;Therapeutic activities;Functional mobility training;Neuromuscular re-education;Patient/family education;Manual techniques;Passive range of motion;Energy conservation    PT Next Visit Plan  progress strength and stability    PT Home Exercise Plan  standing hip abduction, standing marches, standing hip extension, standing hamstring curls, mini squats    Consulted and Agree with Plan of Care  Patient       Patient will benefit from skilled therapeutic intervention in order to improve the following deficits and impairments:  Abnormal gait, Decreased activity tolerance, Decreased balance, Decreased coordination, Decreased endurance, Decreased mobility, Decreased range of motion, Decreased safety awareness, Decreased strength, Difficulty walking, Impaired flexibility, Impaired perceived functional ability, Improper body mechanics, Postural dysfunction, Pain, Impaired UE functional use, Decreased knowledge of precautions, Cardiopulmonary status limiting activity, Decreased knowledge of use of DME  Visit Diagnosis: Muscle weakness (generalized)  Unsteadiness on feet  Neurologic gait disorder     Problem List Patient Active Problem List   Diagnosis Date Noted  . Cognitive deficits   . Labile blood pressure   . Acute blood loss anemia   . Slow transit constipation   . Vascular headache   . 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 [place] 07/03[place]2017  . Communicating [place] ([place] 07/03/2017  . Pelvic fracture ([place] 04/18/2016  . CD (celiac  disease) 08/04/2014  . Cancer of [place] of right lung [place] 08/04/2014  . Age related osteoporosis 11/26/2013  . Absolute anemia 05/21/2013  . H/O malignant neoplasm 05/21/2013  . HLD (hyperlipidemia) 05/21/2013  Janna Arch, PT, DPT   09/11/2018, 9:18 AM  [place] MAIN [facility] SERVICES [address] [place], [place], [address] Phone: [phone]   Fax:  [phone]  Name: JEZABELLA SCHRIEVER MRN: [id number] Date of Birth: 07-Dec-1945

## 2018-09-16 ENCOUNTER — Ambulatory Visit: Payer: PPO

## 2018-09-16 ENCOUNTER — Other Ambulatory Visit: Payer: Self-pay

## 2018-09-16 DIAGNOSIS — M6281 Muscle weakness (generalized): Secondary | ICD-10-CM | POA: Diagnosis not present

## 2018-09-16 DIAGNOSIS — R269 Unspecified abnormalities of gait and mobility: Secondary | ICD-10-CM

## 2018-09-16 DIAGNOSIS — R2681 Unsteadiness on feet: Secondary | ICD-10-CM

## 2018-09-16 NOTE — Therapy (Signed)
[place] MAIN [facility] SERVICES [address] [place], [place], [address] Phone: [phone]   Fax:  [phone]  Physical Therapy Treatment  Patient Details  Name: Krista Evans MRN: [id number] Date of Birth: 05/25/1945 Referring Provider (PT): Ramonita Lab   Encounter Date: 09/16/2018  PT End of Session - 09/16/18 0840    Visit Number  19    Number of Visits  32    Date for PT Re-Evaluation  10/30/18    Authorization Type  9/10 PN start 7/29    PT Start Time  0836    PT Stop Time  0919    PT Time Calculation (min)  43 min    Equipment Utilized During Treatment  Gait belt    Activity Tolerance  Patient tolerated treatment well    Behavior During Therapy  [facility] for tasks assessed/performed       Past Medical History:  Diagnosis Date  . Abnormal Q waves on electrocardiogram   . Anemia   . Aortic atherosclerosis ([place])    "I could possibly have it, my grandmother had it"  . Cancer [facility]) 05/2011   Right upper Lung CA with partial Lobectomy.  . Celiac disease   . Celiac disease   . Dyspnea    with exertion  . Hyperlipidemia   . Hyperlipidemia   . Lung mass   . Meningioma ([place])   . Osteoporosis   . Personal history of chemotherapy   . Personal history of radiation therapy     Past Surgical History:  Procedure Laterality Date  . LUNG LOBECTOMY     right lung  . 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  Subjective Assessment - 09/16/18 0839    Subjective  Patient was compliant with HEP, walked over the weekend. No falls or LOB since last session.    Pertinent History  Patient is returning to outpatient physical therapy after closure for COVID. Due to patient's last session being in March, patient is being re-evaluated today. PMH includes  anemia, aortic atherosclerosis, cancer, celiac disease, dyspnea, HLD, meningioma, osteoporosis, history of chemo/radiation therapy, ventriculoperitoneal shunt (07/03/17).    Limitations  Lifting;Standing;Walking;House hold activities;Other (comment)    How long can you sit comfortably?  n/a    How long can you stand comfortably?  5 minutes    How long can you walk comfortably?  400 ft before fatigue/out of breathe.    Currently in Pain?  No/denies      Nustep level 3 3 minutes, RPM>60 for cardiovascular and musculoskeletal challenge.   TherEx:  Quantum leg press: BLE;  #120 8x ; cueing for keeping RLE neutral due to preference for adduction, cueing for slow velocity for maximal muscle contraction ; 2 sets ; ball between knees to promote neutral alignment and correct muscle activation    Quantum leg press:SLE #50 each LE,; second set #60 10x  cueing for keeping RLE neutral due to preference for adduction, cueing for slow velocity for maximal muscle contraction ;      Ambulate in hallway >1000 ft  with no UE support one near LOB when stopping ambulation requiring assistance to maintain stability; no standing rest breaks required for the first time.  Improved gait mechanics and arm swing. ; CGA with cueing for direction and posture.   Standing calf stretch  60 seconds  Seated calf stretch seconds.  with strap, leg straight, leg bent:   Pro stretch, seated calf stretch df/pf 30 seconds.    Seated adduction ball squeeze12x  3 second holds; cueing to squeeze with LLE first to initiate muscle activation    Seated modified windmills 8x each side.    Scapular retractions 15x   Pt educated throughout session about proper posture and technique with exercises. Improved exercise technique, movement at target joints, use of target muscles after min to mod verbal, visual, tactile cues.                         PT Education - 09/16/18 0839    Education provided  Yes    Education  Details  exercise technique, stability, body mechanics    Methods  Explanation;Demonstration;Tactile cues;Verbal cues    Comprehension  Verbalized understanding;Returned demonstration;Verbal cues required;Tactile cues required;Need further instruction       PT Short Term Goals - 09/04/18 0909      PT SHORT TERM GOAL #1   Title  Patient will be independent in HEP demonstrating successful between session carryover and improve functional mobility and ease of completing ADLs.    Baseline  6/30: HEP given next session 7/29: HEP compliant    Time  2    Period  Weeks    Status  Achieved    Target Date  07/30/18      PT SHORT TERM GOAL #2   Title  Patient will be able to transfer sit<>Stand from regular height chair without pushing on arm rests to exhibit improved independence improved functional strength.     Baseline  6/30: requires excessive BUE support 7/20 : able to perform    Time  2    Period  Weeks    Status  Achieved    Target Date  07/30/18        PT Long Term Goals - 09/04/18 0905      PT LONG TERM GOAL #1   Title  Patient will increase six minute walk test distance to >1000 for progression to community ambulator and improve gait ability    Baseline  6/30: 880 ft with rollator 7/29: 882 ft with 3 minutes w/o rollator 8/19: 880 ft without AD    Time  8    Period  Weeks    Status  Partially Met    Target Date  10/30/18      PT LONG TERM GOAL #2   Title  Patient (> 15 years old) will complete five times sit to stand test in < 15 seconds without UE support indicating an increased LE strength and improved balance.    Baseline  6/30: 16 seconds with excessive BUE support 7/29: 15 seconds with SUE support and one LOB 8/19: 14 seconds with SUE support.    Time  8    Period  Weeks    Status  Partially Met    Target Date  10/30/18      PT LONG TERM GOAL #3   Title  Patient will demonstrate an improved Berg Balance Score of >42/56 as to demonstrate improved balance with ADLs  such as sitting/standing and transfer balance and reduced fall risk.     Baseline  6/30: 37/56 7/29: 39/56 8/19:  42/56    Time  8    Period  Weeks    Status  Achieved      PT LONG TERM GOAL #4  Title  Patient will improve LEFS to 54/80 to demonstrate improved fucnctional strenth and mobility and ease of indepedence    Baseline  6/30: 32/80 7/29: 39/80 8/19: 41/80    Time  8    Period  Weeks    Status  Partially Met    Target Date  10/30/18      PT LONG TERM GOAL #5   Title  Patient will demonstrate an improved Berg Balance Score of >48/56 as to demonstrate improved balance with ADLs such as sitting/standing and transfer balance and reduced fall risk.    Baseline  8/19: 42/56    Time  8    Period  Weeks    Status  New    Target Date  10/30/18            Plan - 09/16/18 0211    Clinical Impression Statement  Patient is improving in her ability to ambulate without rest breaks with improved gait mechanics/arm swing. She is increasing with her LE strength with increasing tolerance and progression of weights. Her LLE continues to be weaker than her right however is additionally showing progression with improved weight acceptance and stability. The patient would benefit from further skilled PT to maximize pt functional activities, safety, and QOL    Personal Factors and Comorbidities  Age;Comorbidity 3+;Fitness;Past/Current Experience;Social Background;Time since onset of injury/illness/exacerbation    Comorbidities  anemia, cancer, celiac disease, dyspnea, meningioma, osteoporosis, HLD    Examination-Activity Limitations  Bed Mobility;Bathing;Bend;Carry;Dressing;Hygiene/Grooming;Locomotion Level;Reach Overhead;Sit;Squat;Stairs;Stand;Toileting;Transfers    Examination-Participation Restrictions  Church;Cleaning;Community Activity;Driving;Interpersonal Relationship;Meal Prep;Laundry;Shop;Volunteer;Yard Work    [employer] Potential  Fair    Clinical Impairments Affecting Rehab Potential  (+)  improvement made within last year, eagerness to participate (-) HEP compliance, safety awareness     PT Frequency  2x / week    PT Duration  8 weeks    PT Treatment/Interventions  ADLs/Self Care Home Management;Cryotherapy;Electrical Stimulation;Iontophoresis 87m/ml Dexamethasone;Moist Heat;Ultrasound;DME Instruction;Gait training;Stair training;Balance training;Therapeutic exercise;Therapeutic activities;Functional mobility training;Neuromuscular re-education;Patient/family education;Manual techniques;Passive range of motion;Energy conservation    PT Next Visit Plan  progress strength and stability    PT Home Exercise Plan  standing hip abduction, standing marches, standing hip extension, standing hamstring curls, mini squats    Consulted and Agree with Plan of Care  Patient       Patient will benefit from skilled therapeutic intervention in order to improve the following deficits and impairments:  Abnormal gait, Decreased activity tolerance, Decreased balance, Decreased coordination, Decreased endurance, Decreased mobility, Decreased range of motion, Decreased safety awareness, Decreased strength, Difficulty walking, Impaired flexibility, Impaired perceived functional ability, Improper body mechanics, Postural dysfunction, Pain, Impaired UE functional use, Decreased knowledge of precautions, Cardiopulmonary status limiting activity, Decreased knowledge of use of DME  Visit Diagnosis: Muscle weakness (generalized)  Unsteadiness on feet  Neurologic gait disorder     Problem List Patient Active Problem List   Diagnosis Date Noted  . Cognitive deficits   . Labile blood pressure   . Acute blood loss anemia   . Slow transit constipation   . Vascular headache   . 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 Hydrocephalus ([place] 07/03/2017  . Communicating [place]cephalus ([place] 07/03/2017  . Pelvic fracture ([place] 04/18/2016  . CD (celiac disease) 08/04/2014  . Cancer of upper lobe of right lung  ([place] 08/04/2014  . Age related osteoporosis 11/26/2013  . Absolute anemia 05/21/2013  . H/O malignant neoplasm 05/21/2013  . HLD (hyperlipidemia) 05/21/2013   MJanna Arch PT, DPT   09/16/2018, 9:26 AM  [place] MAIN [facility] SERVICES [address] [place], [place], [address] Phone: [phone]   Fax:  [phone]  Name: Krista Evans MRN: [id number] Date of Birth: [birth year]

## 2018-09-19 ENCOUNTER — Other Ambulatory Visit: Payer: Self-pay

## 2018-09-19 ENCOUNTER — Ambulatory Visit: Payer: PPO | Attending: Internal Medicine

## 2018-09-19 DIAGNOSIS — M6281 Muscle weakness (generalized): Secondary | ICD-10-CM | POA: Insufficient documentation

## 2018-09-19 DIAGNOSIS — R2681 Unsteadiness on feet: Secondary | ICD-10-CM | POA: Insufficient documentation

## 2018-09-19 DIAGNOSIS — R269 Unspecified abnormalities of gait and mobility: Secondary | ICD-10-CM | POA: Diagnosis not present

## 2018-09-19 NOTE — Therapy (Signed)
[place] MAIN [facility] SERVICES [address] [place], [place], [address] Phone: [phone]   Fax:  [phone]  Physical Therapy Treatment Physical Therapy Progress Note   Dates of reporting period  08/14/18   to   09/19/18   Patient Details  Name: Krista Evans MRN: [id number] Date of Birth: [birth year] Referring Provider (PT): Ramonita Lab   Encounter Date: 09/19/2018  PT End of Session - 09/19/18 0932    Visit Number  20    Number of Visits  32    Date for PT Re-Evaluation  10/30/18    Authorization Type  10/10 PN start 7/29 next session 1/10 PN start 9/3    PT Start Time  0930    PT Stop Time  1014    PT Time Calculation (min)  44 min    Equipment Utilized During Treatment  Gait belt    Activity Tolerance  Patient tolerated treatment well    Behavior During Therapy  [facility] for tasks assessed/performed       Past Medical History:  Diagnosis Date  . Abnormal Q waves on electrocardiogram   . Anemia   . Aortic atherosclerosis ([place])    "I could possibly have it, my grandmother had it"  . Cancer [facility]) 05/2011   Right upper Lung CA with partial Lobectomy.  . Celiac disease   . Celiac disease   . Dyspnea    with exertion  . Hyperlipidemia   . Hyperlipidemia   . Lung mass   . Meningioma ([place])   . Osteoporosis   . Personal history of chemotherapy   . Personal history of radiation therapy     Past Surgical History:  Procedure Laterality Date  . LUNG LOBECTOMY     right lung  . 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  Subjective Assessment - 09/19/18 0931    Subjective  Patient reports compliance with HEP yesterday. No falls or LOB since last session .    Pertinent History  Patient is returning to outpatient physical therapy after closure  for COVID. Due to patient's last session being in March, patient is being re-evaluated today. PMH includes anemia, aortic atherosclerosis, cancer, celiac disease, dyspnea, HLD, meningioma, osteoporosis, history of chemo/radiation therapy, ventriculoperitoneal shunt (07/03/17).    Limitations  Lifting;Standing;Walking;House hold activities;Other (comment)    How long can you sit comfortably?  n/a    How long can you stand comfortably?  5 minutes    How long can you walk comfortably?  400 ft before fatigue/out of breathe.    Currently in Pain?  No/denies         Nustep level 3 3 minutes, RPM>60 for cardiovascular and musculoskeletal challenge.    TherEx:     Ambulate in hallway >1000 ft  with no UE support one near LOB when stopping ambulation requiring assistance to maintain stability; no standing rest breaks required for the first time.  Improved gait mechanics and arm swing. ; CGA with cueing for direction and posture.   Ambulate up/down ramp with no UE support, cueing for widening BOS and activating abdominals for postural control x2 trials   Seated adduction ball squeeze12x  3 second holds; cueing to squeeze with LLE first to initiate muscle activation    Seated modified windmills 8x each side.   Seated marches  with TrA activation, upright posture with arms crossed for trunk strengthening x 15 each LE    Seated abduction RTB 15x; cueing for slow concentric/eccentric phase  Scapular retractions 15x  RTB standing hip extension 10x each LE, BUE support RTB standing hip abduction, 12x each LE, BUE support, verbal cueing for controlling limb back to midline  Neuro Re-ed  airex: no UE support 60 seconds slight ankle instability airex pad: head turns, patient very fearful requiring max encouragement no LOB x 60 seconds  airex pad, one foot on 4" step, two finger support, max cueing due to high patient fearfulness of LOB 60 seconds each LE  Standing toe taps on 5" step with UE support,  cueing for control of support limb x 15 each LE. ; improved control of LLE with foot placement   Pt educated throughout session about proper posture and technique with exercises. Improved exercise technique, movement at target joints, use of target muscles after min to mod verbal, visual, tactile cues.    Patient's condition has the potential to improve in response to therapy. Maximum improvement is yet to be obtained. The anticipated improvement is attainable and reasonable in a generally predictable time.  Patient reports she is getting stronger and more confident in her walking.                        PT Education - 09/19/18 0932    Education provided  Yes    Education Details  exercise technique, stability, body mechanics    Person(s) Educated  Patient    Methods  Explanation;Demonstration;Tactile cues;Verbal cues    Comprehension  Verbalized understanding;Returned demonstration;Verbal cues required;Tactile cues required       PT Short Term Goals - 09/04/18 0909      PT SHORT TERM GOAL #1   Title  Patient will be independent in HEP demonstrating successful between session carryover and improve functional mobility and ease of completing ADLs.    Baseline  6/30: HEP given next session 7/29: HEP compliant    Time  2    Period  Weeks    Status  Achieved    Target Date  07/30/18      PT SHORT TERM GOAL #2   Title  Patient will be able to transfer sit<>Stand from regular height chair without pushing on arm rests to exhibit improved independence improved functional strength.     Baseline  6/30: requires excessive BUE support 7/20 : able to perform    Time  2    Period  Weeks    Status  Achieved    Target Date  07/30/18        PT Long Term Goals - 09/04/18 0905      PT LONG TERM GOAL #1   Title  Patient will increase six minute walk test distance to >1000 for progression to community ambulator and improve gait ability    Baseline  6/30: 880 ft with rollator  7/29: 882 ft with 3 minutes w/o rollator 8/19: 880 ft without AD    Time  8    Period  Weeks    Status  Partially Met    Target Date  10/30/18      PT LONG TERM GOAL #2   Title  Patient (> 4 years old) will complete five times sit to stand test in < 15 seconds without UE support indicating an increased LE strength and improved balance.    Baseline  6/30: 16 seconds  with excessive BUE support 7/29: 15 seconds with SUE support and one LOB 8/19: 14 seconds with SUE support.    Time  8    Period  Weeks    Status  Partially Met    Target Date  10/30/18      PT LONG TERM GOAL #3   Title  Patient will demonstrate an improved Berg Balance Score of >42/56 as to demonstrate improved balance with ADLs such as sitting/standing and transfer balance and reduced fall risk.     Baseline  6/30: 37/56 7/29: 39/56 8/19:  42/56    Time  8    Period  Weeks    Status  Achieved      PT LONG TERM GOAL #4   Title  Patient will improve LEFS to 54/80 to demonstrate improved fucnctional strenth and mobility and ease of indepedence    Baseline  6/30: 32/80 7/29: 39/80 8/19: 41/80    Time  8    Period  Weeks    Status  Partially Met    Target Date  10/30/18      PT LONG TERM GOAL #5   Title  Patient will demonstrate an improved Berg Balance Score of >48/56 as to demonstrate improved balance with ADLs such as sitting/standing and transfer balance and reduced fall risk.    Baseline  8/19: 42/56    Time  8    Period  Weeks    Status  New    Target Date  10/30/18            Plan - 09/19/18 1023    Clinical Impression Statement  Patient's goals performed two weeks ago on 09/04/18. Please see this note for further details on progress towards functional mobility. She is demonstrating improved strength and coordination of LLE however continues to be fearful of LOB requiring encouragement for task continuation.  Patient's condition has the potential to improve in response to therapy. Maximum improvement is yet  to be obtained. The anticipated improvement is attainable and reasonable in a generally predictable time. acceptance and stability. The patient would benefit from further skilled PT to maximize pt functional activities, safety, and QOL    Personal Factors and Comorbidities  Age;Comorbidity 3+;Fitness;Past/Current Experience;Social Background;Time since onset of injury/illness/exacerbation    Comorbidities  anemia, cancer, celiac disease, dyspnea, meningioma, osteoporosis, HLD    Examination-Activity Limitations  Bed Mobility;Bathing;Bend;Carry;Dressing;Hygiene/Grooming;Locomotion Level;Reach Overhead;Sit;Squat;Stairs;Stand;Toileting;Transfers    Examination-Participation Restrictions  Church;Cleaning;Community Activity;Driving;Interpersonal Relationship;Meal Prep;Laundry;Shop;Volunteer;Yard Work    [employer] Potential  Fair    Clinical Impairments Affecting Rehab Potential  (+) improvement made within last year, eagerness to participate (-) HEP compliance, safety awareness     PT Frequency  2x / week    PT Duration  8 weeks    PT Treatment/Interventions  ADLs/Self Care Home Management;Cryotherapy;Electrical Stimulation;Iontophoresis 37m/ml Dexamethasone;Moist Heat;Ultrasound;DME Instruction;Gait training;Stair training;Balance training;Therapeutic exercise;Therapeutic activities;Functional mobility training;Neuromuscular re-education;Patient/family education;Manual techniques;Passive range of motion;Energy conservation    PT Next Visit Plan  progress strength and stability    PT Home Exercise Plan  standing hip abduction, standing marches, standing hip extension, standing hamstring curls, mini squats    Consulted and Agree with Plan of Care  Patient       Patient will benefit from skilled therapeutic intervention in order to improve the following deficits and impairments:  Abnormal gait, Decreased activity tolerance, Decreased balance, Decreased coordination, Decreased endurance, Decreased mobility,  Decreased range of motion, Decreased safety awareness, Decreased strength, Difficulty walking, Impaired flexibility, Impaired perceived functional ability, Improper body  mechanics, Postural dysfunction, Pain, Impaired UE functional use, Decreased knowledge of precautions, Cardiopulmonary status limiting activity, Decreased knowledge of use of DME  Visit Diagnosis: Muscle weakness (generalized)  Unsteadiness on feet  Neurologic gait disorder     Problem List Patient Active Problem List   Diagnosis Date Noted  . Cognitive deficits   . Labile blood pressure   . Acute blood loss anemia   . Slow transit constipation   . Vascular headache   . 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 Hydrocephalus ([place]) 07/03/2017  . 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 Pelvic fracture ([place]) 04/18/2016  . CD (celiac disease) 08/04/2014  . Cancer of upper lobe of right lung ([place]) 08/04/2014  . Age related osteoporosis 11/26/2013  . Absolute anemia 05/21/2013  . H/O malignant neoplasm 05/21/2013  . HLD (hyperlipidemia) 05/21/2013   Janna Arch, PT, DPT   09/19/2018, 10:24 AM  [place] MAIN [facility] SERVICES [address] [place], [place], [address] Phone: [phone]   Fax:  [phone]  Name: Krista Evans MRN: [id number] Date of Birth: 04/22/1945

## 2018-09-24 ENCOUNTER — Other Ambulatory Visit: Payer: Self-pay

## 2018-09-24 ENCOUNTER — Ambulatory Visit: Payer: PPO

## 2018-09-24 DIAGNOSIS — R2681 Unsteadiness on feet: Secondary | ICD-10-CM

## 2018-09-24 DIAGNOSIS — M6281 Muscle weakness (generalized): Secondary | ICD-10-CM | POA: Diagnosis not present

## 2018-09-24 DIAGNOSIS — R269 Unspecified abnormalities of gait and mobility: Secondary | ICD-10-CM

## 2018-09-24 NOTE — Therapy (Signed)
[place] MAIN [facility] SERVICES [address] [place], [place], [address] Phone: [phone]   Fax:  [phone]  Physical Therapy Treatment  Patient Details  Name: Krista Evans MRN: [id number] Date of Birth: 21-Nov-1945 Referring Provider (PT): Ramonita Lab   Encounter Date: 09/24/2018  PT End of Session - 09/24/18 0938    Visit Number  21    Number of Visits  32    Date for PT Re-Evaluation  10/30/18    Authorization Type  1/10 PN start 9/3    PT Start Time  0931    PT Stop Time  1014    PT Time Calculation (min)  43 min    Equipment Utilized During Treatment  Gait belt    Activity Tolerance  Patient tolerated treatment well    Behavior During Therapy  [facility] for tasks assessed/performed       Past Medical History:  Diagnosis Date  . Abnormal Q waves on electrocardiogram   . Anemia   . Aortic atherosclerosis ([place])    "I could possibly have it, my grandmother had it"  . Cancer [facility]) 05/2011   Right upper Lung CA with partial Lobectomy.  . Celiac disease   . Celiac disease   . Dyspnea    with exertion  . Hyperlipidemia   . Hyperlipidemia   . Lung mass   . Meningioma ([place])   . Osteoporosis   . Personal history of chemotherapy   . Personal history of radiation therapy     Past Surgical History:  Procedure Laterality Date  . LUNG LOBECTOMY     right lung  . 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  Subjective Assessment - 09/24/18 0936    Subjective  Patient reports having a good weekend. Patient reports no falls or LOB since last session.    Pertinent History  Patient is returning to outpatient physical therapy after closure for COVID. Due to patient's last session being in March, patient is being re-evaluated today. PMH includes  anemia, aortic atherosclerosis, cancer, celiac disease, dyspnea, HLD, meningioma, osteoporosis, history of chemo/radiation therapy, ventriculoperitoneal shunt (07/03/17).    Limitations  Lifting;Standing;Walking;House hold activities;Other (comment)    How long can you sit comfortably?  n/a    How long can you stand comfortably?  5 minutes    How long can you walk comfortably?  400 ft before fatigue/out of breathe.    Currently in Pain?  No/denies           Nustep level 3 3 minutes, RPM>60 for cardiovascular and musculoskeletal challenge.    TherEx:  Quantum leg press: BLE;  #120 10x ; cueing for keeping RLE neutral due to preference for adduction, cueing for slow velocity for maximal muscle contraction ; 2 sets ; ball between knees to promote neutral alignment and correct muscle activation    Quantum leg press:SLE #60 10x 2 sets    cueing for slow velocity for maximal muscle contraction ;     Seated adduction ball squeeze12x  3 second holds; cueing to squeeze with LLE first to initiate muscle activation     Scapular retractions 15x  Standing RTB hip extension 10x each LE, BUE support, 2 sets LLE, one set RLE   Standing RTB hip abduction 10x each LE BUE support; 2 sets LLE, one set  RLE  Seated heel toe rocker 20x  Neuro Re-ed airex: no UE support 60 seconds slight ankle instability airex pad: head turns, patient very fearful requiring max encouragement no LOB x 60 seconds  Standing toe taps on 5" step with UE support, cueing for control of support limb x 15 each LE. ; improved control of LLE with foot placement   Red light green light in hallway for initiation/termination of movement. CGA no AD 2x 86 ft.    Pt educated throughout session about proper posture and technique with exercises. Improved exercise technique, movement at target joints, use of target muscles after min to mod verbal, visual, tactile cues.                      PT Education - 09/24/18  [phone]    Education provided  Yes    Education Details  exercise technique, stability, body mechanics    Person(s) Educated  Patient    Methods  Explanation;Demonstration;Tactile cues;Verbal cues    Comprehension  Verbalized understanding;Returned demonstration;Verbal cues required;Tactile cues required       PT Short Term Goals - 09/04/18 0909      PT SHORT TERM GOAL #1   Title  Patient will be independent in HEP demonstrating successful between session carryover and improve functional mobility and ease of completing ADLs.    Baseline  6/30: HEP given next session 7/29: HEP compliant    Time  2    Period  Weeks    Status  Achieved    Target Date  07/30/18      PT SHORT TERM GOAL #2   Title  Patient will be able to transfer sit<>Stand from regular height chair without pushing on arm rests to exhibit improved independence improved functional strength.     Baseline  6/30: requires excessive BUE support 7/20 : able to perform    Time  2    Period  Weeks    Status  Achieved    Target Date  07/30/18        PT Long Term Goals - 09/04/18 0905      PT LONG TERM GOAL #1   Title  Patient will increase six minute walk test distance to >1000 for progression to community ambulator and improve gait ability    Baseline  6/30: 880 ft with rollator 7/29: 882 ft with 3 minutes w/o rollator 8/19: 880 ft without AD    Time  8    Period  Weeks    Status  Partially Met    Target Date  10/30/18      PT LONG TERM GOAL #2   Title  Patient (> 43 years old) will complete five times sit to stand test in < 15 seconds without UE support indicating an increased LE strength and improved balance.    Baseline  6/30: 16 seconds with excessive BUE support 7/29: 15 seconds with SUE support and one LOB 8/19: 14 seconds with SUE support.    Time  8    Period  Weeks    Status  Partially Met    Target Date  10/30/18      PT LONG TERM GOAL #3   Title  Patient will demonstrate an improved Berg Balance Score of  >42/56 as to demonstrate improved balance with ADLs such as sitting/standing and transfer balance and reduced fall risk.     Baseline  6/30: 37/56 7/29: 39/56 8/19:  42/56    Time  8  Period  Weeks    Status  Achieved      PT LONG TERM GOAL #4   Title  Patient will improve LEFS to 54/80 to demonstrate improved fucnctional strenth and mobility and ease of indepedence    Baseline  6/30: 32/80 7/29: 39/80 8/19: 41/80    Time  8    Period  Weeks    Status  Partially Met    Target Date  10/30/18      PT LONG TERM GOAL #5   Title  Patient will demonstrate an improved Berg Balance Score of >48/56 as to demonstrate improved balance with ADLs such as sitting/standing and transfer balance and reduced fall risk.    Baseline  8/19: 42/56    Time  8    Period  Weeks    Status  New    Target Date  10/30/18            Plan - 09/24/18 0944    Clinical Impression Statement  Patient is challenged with dual task of talking with performance of interventions, losing her breath more quickly requiring rest break.  Strength continues to progress with functional mobility and increase of repetition of interventions. Her LLE continues to be weaker than her right however is additionally showing progression with improved weight acceptance and stability. The patient would benefit from further skilled PT to maximize pt functional activities, safety, and QOL    Personal Factors and Comorbidities  Age;Comorbidity 3+;Fitness;Past/Current Experience;Social Background;Time since onset of injury/illness/exacerbation    Comorbidities  anemia, cancer, celiac disease, dyspnea, meningioma, osteoporosis, HLD    Examination-Activity Limitations  Bed Mobility;Bathing;Bend;Carry;Dressing;Hygiene/Grooming;Locomotion Level;Reach Overhead;Sit;Squat;Stairs;Stand;Toileting;Transfers    Examination-Participation Restrictions  Church;Cleaning;Community Activity;Driving;Interpersonal Relationship;Meal  Prep;Laundry;Shop;Volunteer;Yard Work    [employer] Potential  Fair    Clinical Impairments Affecting Rehab Potential  (+) improvement made within last year, eagerness to participate (-) HEP compliance, safety awareness     PT Frequency  2x / week    PT Duration  8 weeks    PT Treatment/Interventions  ADLs/Self Care Home Management;Cryotherapy;Electrical Stimulation;Iontophoresis 19m/ml Dexamethasone;Moist Heat;Ultrasound;DME Instruction;Gait training;Stair training;Balance training;Therapeutic exercise;Therapeutic activities;Functional mobility training;Neuromuscular re-education;Patient/family education;Manual techniques;Passive range of motion;Energy conservation    PT Next Visit Plan  progress strength and stability    PT Home Exercise Plan  standing hip abduction, standing marches, standing hip extension, standing hamstring curls, mini squats    Consulted and Agree with Plan of Care  Patient       Patient will benefit from skilled therapeutic intervention in order to improve the following deficits and impairments:  Abnormal gait, Decreased activity tolerance, Decreased balance, Decreased coordination, Decreased endurance, Decreased mobility, Decreased range of motion, Decreased safety awareness, Decreased strength, Difficulty walking, Impaired flexibility, Impaired perceived functional ability, Improper body mechanics, Postural dysfunction, Pain, Impaired UE functional use, Decreased knowledge of precautions, Cardiopulmonary status limiting activity, Decreased knowledge of use of DME  Visit Diagnosis: Muscle weakness (generalized)  Unsteadiness on feet  Neurologic gait disorder     Problem List Patient Active Problem List   Diagnosis Date Noted  . Cognitive deficits   . Labile blood pressure   . Acute blood loss anemia   . Slow transit constipation   . Vascular headache   . 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 [place] ([place] 07/03[place]2017  . [place]  07/03/2017  . Pelvic [place] ([place] 04/18/2016  . CD (celiac disease) 08/04/2014  . Cancer of [place] lobe of right lung ([place] 08/04/2014  . Age related osteoporosis 11/26/2013  . Absolute  anemia 05/21/2013  . H/O malignant neoplasm 05/21/2013  . HLD (hyperlipidemia) 05/21/2013   Janna Arch, PT, DPT   09/24/2018, 10:15 AM  [place] MAIN [facility] SERVICES [address] [place], [place], [address] Phone: [phone]   Fax:  [phone]  Name: ROCKIE VAWTER MRN: [id number] Date of Birth: 1945/10/02

## 2018-09-26 ENCOUNTER — Other Ambulatory Visit: Payer: Self-pay

## 2018-09-26 ENCOUNTER — Ambulatory Visit: Payer: PPO

## 2018-09-26 DIAGNOSIS — R269 Unspecified abnormalities of gait and mobility: Secondary | ICD-10-CM

## 2018-09-26 DIAGNOSIS — R2681 Unsteadiness on feet: Secondary | ICD-10-CM

## 2018-09-26 DIAGNOSIS — M6281 Muscle weakness (generalized): Secondary | ICD-10-CM | POA: Diagnosis not present

## 2018-09-26 NOTE — Therapy (Signed)
[place] MAIN [facility] SERVICES [address] [place], [place], [address] Phone: [phone]   Fax:  [phone]  Physical Therapy Treatment  Patient Details  Name: Krista Evans MRN: [id number] Date of Birth: 13-Aug-1945 Referring Provider (PT): Ramonita Lab   Encounter Date: 09/26/2018  PT End of Session - 09/26/18 0936    Visit Number  22    Number of Visits  32    Date for PT Re-Evaluation  10/30/18    Authorization Type  2/10 PN start 9/3    PT Start Time  0930    PT Stop Time  1014    PT Time Calculation (min)  44 min    Equipment Utilized During Treatment  Gait belt    Activity Tolerance  Patient tolerated treatment well    Behavior During Therapy  [facility] for tasks assessed/performed       Past Medical History:  Diagnosis Date  . Abnormal Q waves on electrocardiogram   . Anemia   . Aortic atherosclerosis ([place])    "I could possibly have it, my grandmother had it"  . Cancer [facility]) 05/2011   Right upper Lung CA with partial Lobectomy.  . Celiac disease   . Celiac disease   . Dyspnea    with exertion  . Hyperlipidemia   . Hyperlipidemia   . Lung mass   . Meningioma ([place])   . Osteoporosis   . Personal history of chemotherapy   . Personal history of radiation therapy     Past Surgical History:  Procedure Laterality Date  . LUNG LOBECTOMY     right lung  . 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  Subjective Assessment - 09/26/18 0932    Subjective  Patient reports doing well since last visit. Has been walking with her husband everynight. No falls since last session.    Pertinent History  Patient is returning to outpatient physical therapy after closure for COVID. Due to patient's last session being in March, patient is being  re-evaluated today. PMH includes anemia, aortic atherosclerosis, cancer, celiac disease, dyspnea, HLD, meningioma, osteoporosis, history of chemo/radiation therapy, ventriculoperitoneal shunt (07/03/17).    Limitations  Lifting;Standing;Walking;House hold activities;Other (comment)    How long can you sit comfortably?  n/a    How long can you stand comfortably?  5 minutes    How long can you walk comfortably?  400 ft before fatigue/out of breathe.    Currently in Pain?  No/denies           Nustep level 3 3 minutes, RPM>60 for cardiovascular and musculoskeletal challenge.    TherEx:    Seated adduction ball squeeze12x  3 second holds; cueing to squeeze with LLE first to initiate muscle activation     Scapular retractions 15x   Standing RTB hip extension 10x each LE, BUE support, 2 sets LLE, one set RLE    Standing RTB hip abduction 10x each LE BUE support; 2 sets LLE, one set RLE   Seated RTB marching, cueing for keeping knees apart. 20x  Seated RTB abduction, cueing to pull with lLE first then right LE 15x   Seated RTB hamstring curl 15x each LE   Seated RTB around ankles ER/IR 15x each LE  Weighted ball half circle arc to lateral aspect of knees (1000 Gr) 60  seconds   Weighted ball chest press (1000 gr) 15x with upright posture  Ambulate in hallway>1000 ftwithnoUE supportone nearLOB when stopping ambulation requiring assistance to maintain stability; no standing rest breaks required for the first time. Improved gait mechanics and arm swing. ; CGA with cueing for direction and posture.    Neuro Re-ed  airex: no UE support 60 seconds slight ankle instability  airex pad: head turns, patient very fearful requiring max encouragement no LOB playing eye spy for one round.    airex pad Standing toe taps on 5" step with UE support, cueing for control of support limb x 20 each LE. ; improved control of LLE with foot placement   airex pad: lateral toe taps on 5" step with  SUE support, 12x each LE, challenged stabilizing on LLE  Weave between 5 cones in a row with no AD, CGA with max cueing. Initial scuffing of R foot due to limited weight acceptance onto LLE, 8x    Pt educated throughout session about proper posture and technique with exercises. Improved exercise technique, movement at target joints, use of target muscles after min to mod verbal, visual, tactile cues.                   PT Education - 09/26/18 0935    Education provided  Yes    Education Details  exercise technique, stability, body mechanics    Person(s) Educated  Patient    Methods  Explanation;Demonstration;Tactile cues;Verbal cues    Comprehension  Verbalized understanding;Returned demonstration;Verbal cues required;Tactile cues required       PT Short Term Goals - 09/04/18 0909      PT SHORT TERM GOAL #1   Title  Patient will be independent in HEP demonstrating successful between session carryover and improve functional mobility and ease of completing ADLs.    Baseline  6/30: HEP given next session 7/29: HEP compliant    Time  2    Period  Weeks    Status  Achieved    Target Date  07/30/18      PT SHORT TERM GOAL #2   Title  Patient will be able to transfer sit<>Stand from regular height chair without pushing on arm rests to exhibit improved independence improved functional strength.     Baseline  6/30: requires excessive BUE support 7/20 : able to perform    Time  2    Period  Weeks    Status  Achieved    Target Date  07/30/18        PT Long Term Goals - 09/04/18 0905      PT LONG TERM GOAL #1   Title  Patient will increase six minute walk test distance to >1000 for progression to community ambulator and improve gait ability    Baseline  6/30: 880 ft with rollator 7/29: 882 ft with 3 minutes w/o rollator 8/19: 880 ft without AD    Time  8    Period  Weeks    Status  Partially Met    Target Date  10/30/18      PT LONG TERM GOAL #2   Title  Patient (>  14 years old) will complete five times sit to stand test in < 15 seconds without UE support indicating an increased LE strength and improved balance.    Baseline  6/30: 16 seconds with excessive BUE support 7/29: 15 seconds with SUE support and one LOB 8/19: 14 seconds with SUE support.    Time  8    Period  Weeks    Status  Partially Met    Target Date  10/30/18      PT LONG TERM GOAL #3   Title  Patient will demonstrate an improved Berg Balance Score of >42/56 as to demonstrate improved balance with ADLs such as sitting/standing and transfer balance and reduced fall risk.     Baseline  6/30: 37/56 7/29: 39/56 8/19:  42/56    Time  8    Period  Weeks    Status  Achieved      PT LONG TERM GOAL #4   Title  Patient will improve LEFS to 54/80 to demonstrate improved fucnctional strenth and mobility and ease of indepedence    Baseline  6/30: 32/80 7/29: 39/80 8/19: 41/80    Time  8    Period  Weeks    Status  Partially Met    Target Date  10/30/18      PT LONG TERM GOAL #5   Title  Patient will demonstrate an improved Berg Balance Score of >48/56 as to demonstrate improved balance with ADLs such as sitting/standing and transfer balance and reduced fall risk.    Baseline  8/19: 42/56    Time  8    Period  Weeks    Status  New    Target Date  10/30/18            Plan - 09/26/18 1000    Clinical Impression Statement  Patient is fearful of stabilizing self on unstable surfaces without UE support due to fear of falling. Patient is challenged with stabilizing on LLE more than RLE due to limited strength and stability of LLE. Patient continues to be highly motivated to continue improving stability. The patient would benefit from further skilled PT to maximize pt functional activities, safety, and QOL    Personal Factors and Comorbidities  Age;Comorbidity 3+;Fitness;Past/Current Experience;Social Background;Time since onset of injury/illness/exacerbation    Comorbidities  anemia, cancer,  celiac disease, dyspnea, meningioma, osteoporosis, HLD    Examination-Activity Limitations  Bed Mobility;Bathing;Bend;Carry;Dressing;Hygiene/Grooming;Locomotion Level;Reach Overhead;Sit;Squat;Stairs;Stand;Toileting;Transfers    Examination-Participation Restrictions  Church;Cleaning;Community Activity;Driving;Interpersonal Relationship;Meal Prep;Laundry;Shop;Volunteer;Yard Work    [employer] Potential  Fair    Clinical Impairments Affecting Rehab Potential  (+) improvement made within last year, eagerness to participate (-) HEP compliance, safety awareness     PT Frequency  2x / week    PT Duration  8 weeks    PT Treatment/Interventions  ADLs/Self Care Home Management;Cryotherapy;Electrical Stimulation;Iontophoresis 27m/ml Dexamethasone;Moist Heat;Ultrasound;DME Instruction;Gait training;Stair training;Balance training;Therapeutic exercise;Therapeutic activities;Functional mobility training;Neuromuscular re-education;Patient/family education;Manual techniques;Passive range of motion;Energy conservation    PT Next Visit Plan  progress strength and stability    PT Home Exercise Plan  standing hip abduction, standing marches, standing hip extension, standing hamstring curls, mini squats    Consulted and Agree with Plan of Care  Patient       Patient will benefit from skilled therapeutic intervention in order to improve the following deficits and impairments:  Abnormal gait, Decreased activity tolerance, Decreased balance, Decreased coordination, Decreased endurance, Decreased mobility, Decreased range of motion, Decreased safety awareness, Decreased strength, Difficulty walking, Impaired flexibility, Impaired perceived functional ability, Improper body mechanics, Postural dysfunction, Pain, Impaired UE functional use, Decreased knowledge of precautions, Cardiopulmonary status limiting activity, Decreased knowledge of use of DME  Visit Diagnosis: Muscle weakness (generalized)  Unsteadiness on  feet  Neurologic gait disorder     Problem List Patient Active Problem List   Diagnosis Date Noted  . Cognitive  deficits   . Labile blood pressure   . Acute blood loss anemia   . Slow transit constipation   . Vascular headache   . 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 Hydrocephalus ([place]) 07/03/2017  . 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 Pelvic fracture ([place]) 04/18/2016  . CD (celiac disease) 08/04/2014  . Cancer of upper lobe of right lung ([place]) 08/04/2014  . Age related osteoporosis 11/26/2013  . Absolute anemia 05/21/2013  . H/O malignant neoplasm 05/21/2013  . HLD (hyperlipidemia) 05/21/2013   Janna Arch, PT, DPT   09/26/2018, 10:41 AM  [place] MAIN [facility] SERVICES [address] [place], [place], [address] Phone: [phone]   Fax:  [phone]  Name: MAGGI HERSHKOWITZ MRN: [id number] Date of Birth: 1945-05-14

## 2018-09-30 ENCOUNTER — Ambulatory Visit: Payer: PPO

## 2018-10-01 ENCOUNTER — Ambulatory Visit: Payer: PPO

## 2018-10-01 ENCOUNTER — Other Ambulatory Visit: Payer: Self-pay

## 2018-10-01 DIAGNOSIS — M6281 Muscle weakness (generalized): Secondary | ICD-10-CM | POA: Diagnosis not present

## 2018-10-01 DIAGNOSIS — R269 Unspecified abnormalities of gait and mobility: Secondary | ICD-10-CM

## 2018-10-01 DIAGNOSIS — R2681 Unsteadiness on feet: Secondary | ICD-10-CM

## 2018-10-01 NOTE — Therapy (Signed)
[place] MAIN [facility] SERVICES [address] [place], [place], [address] Phone: [phone]   Fax:  [phone]  Physical Therapy Treatment  Patient Details  Name: Krista Evans MRN: [id number] Date of Birth: 16-May-1945 Referring Provider (PT): Ramonita Lab   Encounter Date: 10/01/2018  PT End of Session - 10/01/18 1009    Visit Number  23    Number of Visits  32    Date for PT Re-Evaluation  10/30/18    Authorization Type  3/10 PN start 9/3    PT Start Time  0930    PT Stop Time  1015    PT Time Calculation (min)  45 min    Equipment Utilized During Treatment  Gait belt    Activity Tolerance  Patient tolerated treatment well    Behavior During Therapy  [facility] for tasks assessed/performed       Past Medical History:  Diagnosis Date  . Abnormal Q waves on electrocardiogram   . Anemia   . Aortic atherosclerosis ([place])    "I could possibly have it, my grandmother had it"  . Cancer [facility]) 05/2011   Right upper Lung CA with partial Lobectomy.  . Celiac disease   . Celiac disease   . Dyspnea    with exertion  . Hyperlipidemia   . Hyperlipidemia   . Lung mass   . Meningioma ([place])   . Osteoporosis   . Personal history of chemotherapy   . Personal history of radiation therapy     Past Surgical History:  Procedure Laterality Date  . LUNG LOBECTOMY     right lung  . 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  Subjective Assessment - 10/01/18 0934    Subjective  Patient reports compliance with HEP. Has been walking with her husband over the weekend with less support required from him. No falls or LOB since last session.    Pertinent History  Patient is returning to outpatient physical therapy after closure for COVID. Due to patient's last session  being in March, patient is being re-evaluated today. PMH includes anemia, aortic atherosclerosis, cancer, celiac disease, dyspnea, HLD, meningioma, osteoporosis, history of chemo/radiation therapy, ventriculoperitoneal shunt (07/03/17).    Limitations  Lifting;Standing;Walking;House hold activities;Other (comment)    How long can you sit comfortably?  n/a    How long can you stand comfortably?  5 minutes    How long can you walk comfortably?  400 ft before fatigue/out of breathe.    Currently in Pain?  No/denies             Nustep level 3 3 minutes, RPM>60 for cardiovascular and musculoskeletal challenge.    TherEx:     Scapular retractions 15x     Seated RTB marching, cueing for keeping knees apart. 20x   Seated RTB abduction, cueing to pull with LLE first then right LE 15x    Seated RTB hamstring curl 15x each LE    Seated RTB around ankles ER/IR 15x each LE    Cross body jabs L and R 2 minutes with PT cueing with pds with upright posture  Cross body jobs L and R with duck x 2 minutes with PT cueing with pads with upright posture   Neuro Re-ed  airex: no UE support 60 seconds slight ankle instability  airex pad: horizontal head turns 30 seconds, vertical heads turns 30 seconds   airex pad Standing toe taps on 5" step with UE support, cueing for control of support limb x 20 each LE. ; improved control of LLE with foot placement    airex pad: lateral toe taps on 5" step with SUE support, 12x each LE, challenged stabilizing on LLE   Weave between 5 cones in a row with no AD, CGA with max cueing. Initial scuffing of R foot due to limited weight acceptance onto LLE, 8x   Soccer ball LAQ with focus on muscle recruitment with coordination and spatial awareness x 3 trials   Pt educated throughout session about proper posture and technique with exercises. Improved exercise technique, movement at target joints, use of target muscles after min to mod verbal, visual, tactile cues.                        PT Education - 10/01/18 0955    Education provided  Yes    Education Details  exercise technique, stability, body mechanics.    Person(s) Educated  Patient    Methods  Explanation;Demonstration;Tactile cues;Verbal cues    Comprehension  Verbalized understanding;Returned demonstration;Verbal cues required;Tactile cues required       PT Short Term Goals - 09/04/18 0909      PT SHORT TERM GOAL #1   Title  Patient will be independent in HEP demonstrating successful between session carryover and improve functional mobility and ease of completing ADLs.    Baseline  6/30: HEP given next session 7/29: HEP compliant    Time  2    Period  Weeks    Status  Achieved    Target Date  07/30/18      PT SHORT TERM GOAL #2   Title  Patient will be able to transfer sit<>Stand from regular height chair without pushing on arm rests to exhibit improved independence improved functional strength.     Baseline  6/30: requires excessive BUE support 7/20 : able to perform    Time  2    Period  Weeks    Status  Achieved    Target Date  07/30/18        PT Long Term Goals - 09/04/18 0905      PT LONG TERM GOAL #1   Title  Patient will increase six minute walk test distance to >1000 for progression to community ambulator and improve gait ability    Baseline  6/30: 880 ft with rollator 7/29: 882 ft with 3 minutes w/o rollator 8/19: 880 ft without AD    Time  8    Period  Weeks    Status  Partially Met    Target Date  10/30/18      PT LONG TERM GOAL #2   Title  Patient (> 75 years old) will complete five times sit to stand test in < 15 seconds without UE support indicating an increased LE strength and improved balance.    Baseline  6/30: 16 seconds with excessive BUE support 7/29: 15 seconds with SUE support and one LOB 8/19: 14 seconds with SUE support.    Time  8    Period  Weeks    Status  Partially Met    Target Date  10/30/18      PT LONG TERM GOAL #3    Title  Patient will demonstrate an improved Berg Balance Score of >42/56 as to demonstrate improved balance  with ADLs such as sitting/standing and transfer balance and reduced fall risk.     Baseline  6/30: 37/56 7/29: 39/56 8/19:  42/56    Time  8    Period  Weeks    Status  Achieved      PT LONG TERM GOAL #4   Title  Patient will improve LEFS to 54/80 to demonstrate improved fucnctional strenth and mobility and ease of indepedence    Baseline  6/30: 32/80 7/29: 39/80 8/19: 41/80    Time  8    Period  Weeks    Status  Partially Met    Target Date  10/30/18      PT LONG TERM GOAL #5   Title  Patient will demonstrate an improved Berg Balance Score of >48/56 as to demonstrate improved balance with ADLs such as sitting/standing and transfer balance and reduced fall risk.    Baseline  8/19: 42/56    Time  8    Period  Weeks    Status  New    Target Date  10/30/18            Plan - 10/01/18 1010    Clinical Impression Statement  Patient presents with good motivation. Is challenged with limb placement of LLE with poor stabilization and fear of LOB when utilizing it to negotiate obstacles or steps. When negotiating turns/cones she regresses to shuffle step pattern of ambulation which improves with cueing and repetition. The patient would benefit from further skilled PT to maximize pt functional activities, safety, and QOL    Personal Factors and Comorbidities  Age;Comorbidity 3+;Fitness;Past/Current Experience;Social Background;Time since onset of injury/illness/exacerbation    Comorbidities  anemia, cancer, celiac disease, dyspnea, meningioma, osteoporosis, HLD    Examination-Activity Limitations  Bed Mobility;Bathing;Bend;Carry;Dressing;Hygiene/Grooming;Locomotion Level;Reach Overhead;Sit;Squat;Stairs;Stand;Toileting;Transfers    Examination-Participation Restrictions  Church;Cleaning;Community Activity;Driving;Interpersonal Relationship;Meal Prep;Laundry;Shop;Volunteer;Yard Work     [employer] Potential  Fair    Clinical Impairments Affecting Rehab Potential  (+) improvement made within last year, eagerness to participate (-) HEP compliance, safety awareness     PT Frequency  2x / week    PT Duration  8 weeks    PT Treatment/Interventions  ADLs/Self Care Home Management;Cryotherapy;Electrical Stimulation;Iontophoresis 46m/ml Dexamethasone;Moist Heat;Ultrasound;DME Instruction;Gait training;Stair training;Balance training;Therapeutic exercise;Therapeutic activities;Functional mobility training;Neuromuscular re-education;Patient/family education;Manual techniques;Passive range of motion;Energy conservation    PT Next Visit Plan  progress strength and stability    PT Home Exercise Plan  standing hip abduction, standing marches, standing hip extension, standing hamstring curls, mini squats    Consulted and Agree with Plan of Care  Patient       Patient will benefit from skilled therapeutic intervention in order to improve the following deficits and impairments:  Abnormal gait, Decreased activity tolerance, Decreased balance, Decreased coordination, Decreased endurance, Decreased mobility, Decreased range of motion, Decreased safety awareness, Decreased strength, Difficulty walking, Impaired flexibility, Impaired perceived functional ability, Improper body mechanics, Postural dysfunction, Pain, Impaired UE functional use, Decreased knowledge of precautions, Cardiopulmonary status limiting activity, Decreased knowledge of use of DME  Visit Diagnosis: Muscle weakness (generalized)  Unsteadiness on feet  Neurologic gait disorder     Problem List Patient Active Problem List   Diagnosis Date Noted  . Cognitive deficits   . Labile blood pressure   . Acute blood loss anemia   . Slow transit constipation   . Vascular headache   . 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 [place] ([place] 07/03/2017  . [place] 07/03[place]2017  . Pelvic fracture ([place]  04/18/2016  .  CD (celiac disease) 08/04/2014  . Cancer of upper lobe of right lung ([place]) 08/04/2014  . Age related osteoporosis 11/26/2013  . Absolute anemia 05/21/2013  . H/O malignant neoplasm 05/21/2013  . HLD (hyperlipidemia) 05/21/2013   Janna Arch, PT, DPT   10/01/2018, 10:18 AM  [place] MAIN [facility] SERVICES [address] [place], [place], [address] Phone: [phone]   Fax:  [phone]  Name: Krista Evans MRN: [id number] Date of Birth: [birth year]

## 2018-10-02 ENCOUNTER — Ambulatory Visit: Payer: PPO

## 2018-10-03 ENCOUNTER — Ambulatory Visit: Payer: PPO

## 2018-10-07 ENCOUNTER — Ambulatory Visit: Payer: PPO

## 2018-10-08 ENCOUNTER — Other Ambulatory Visit: Payer: Self-pay

## 2018-10-08 ENCOUNTER — Ambulatory Visit: Payer: PPO

## 2018-10-08 DIAGNOSIS — M6281 Muscle weakness (generalized): Secondary | ICD-10-CM | POA: Diagnosis not present

## 2018-10-08 DIAGNOSIS — R2681 Unsteadiness on feet: Secondary | ICD-10-CM

## 2018-10-08 DIAGNOSIS — R269 Unspecified abnormalities of gait and mobility: Secondary | ICD-10-CM

## 2018-10-08 NOTE — Therapy (Deleted)
[place] MAIN [facility] SERVICES [address] [place], [place], [address] Phone: [phone]   Fax:  [phone]  Physical Therapy Treatment  Patient Details  Name: Krista Evans MRN: [id number] Date of Birth: 11/24/1945 Referring Provider (PT): Ramonita Lab   Encounter Date: 10/08/2018  PT End of Session - 10/08/18 0944    Visit Number  24    Number of Visits  32    Date for PT Re-Evaluation  10/30/18    Authorization Type  4/10 PN start 9/3    PT Start Time  3491    PT Stop Time  1018    PT Time Calculation (min)  40 min    Equipment Utilized During Treatment  Gait belt    Activity Tolerance  Patient tolerated treatment well;No increased pain;Patient limited by fatigue    Behavior During Therapy  [facility] for tasks assessed/performed       Past Medical History:  Diagnosis Date  . Abnormal Q waves on electrocardiogram   . Anemia   . Aortic atherosclerosis ([place])    "I could possibly have it, my grandmother had it"  . Cancer [facility]) 05/2011   Right upper Lung CA with partial Lobectomy.  . Celiac disease   . Celiac disease   . Dyspnea    with exertion  . Hyperlipidemia   . Hyperlipidemia   . Lung mass   . Meningioma ([place])   . Osteoporosis   . Personal history of chemotherapy   . Personal history of radiation therapy     Past Surgical History:  Procedure Laterality Date  . LUNG LOBECTOMY     right lung  . 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  Subjective Assessment - 10/08/18 0943    Subjective  Pt doing well this date. Reports she saw her grandson play travel baseball over the weekend. She denies c/o pain but maintains SOB with baseline activity.    Pertinent History  Patient is returning to outpatient physical therapy after closure for  COVID. Due to patient's last session being in March, patient is being re-evaluated today. PMH includes anemia, aortic atherosclerosis, cancer, celiac disease, dyspnea, HLD, meningioma, osteoporosis, history of chemo/radiation therapy, ventriculoperitoneal shunt (07/03/17).    Currently in Pain?  No/denies       TherEx: Scapular retraction 15x3secH Seated RTB marching, cueing for keeping knees wide 20x Seated RTB abduction+ER (band clam, ball between feet)  *cueing to pull with LLE first then right LE 15x  Seated hip IR, yellowTB at ankles, rainbow ball betwixt knees 2x10x3secH Seated TB-loop hamstring curl 15x each LE (RTB Rt; YTB Lt) Soccer ball LAQ with focus on muscle recruitment with coordination and spatial awareness, 1x15 bilat   Neuro Re-ed airex: no UE support 60 seconds slight ankle instability  airex pad: horizontal head turns 30 seconds, vertical heads turns 30 seconds  airex padStanding toe taps on 5" step with UE support, cueing for control of support limb x20each LE. ; improved control of LLE with foot placement  airex pad: lateral toe taps on 5" step with SUE support, 12x each LE, challenged stabilizing on LLE  Weave between 5 cones in a row with no AD, CGA with max cueing. Initial scuffing of R foot due to limited weight acceptance onto LLE, 8x  Cross body jabs L and  R 2 minutes with PT cueing with pds with upright posture  Cross body jobs L and R with duck x 2 minutes with PT cueing with pads with upright posture   PT Short Term Goals - 09/04/18 0909      PT SHORT TERM GOAL #1   Title  Patient will be independent in HEP demonstrating successful between session carryover and improve functional mobility and ease of completing ADLs.    Baseline  6/30: HEP given next session 7/29: HEP compliant    Time  2    Period  Weeks    Status  Achieved    Target Date  07/30/18      PT SHORT TERM GOAL #2   Title  Patient will be able to transfer sit<>Stand  from regular height chair without pushing on arm rests to exhibit improved independence improved functional strength.     Baseline  6/30: requires excessive BUE support 7/20 : able to perform    Time  2    Period  Weeks    Status  Achieved    Target Date  07/30/18        PT Long Term Goals - 09/04/18 0905      PT LONG TERM GOAL #1   Title  Patient will increase six minute walk test distance to >1000 for progression to community ambulator and improve gait ability    Baseline  6/30: 880 ft with rollator 7/29: 882 ft with 3 minutes w/o rollator 8/19: 880 ft without AD    Time  8    Period  Weeks    Status  Partially Met    Target Date  10/30/18      PT LONG TERM GOAL #2   Title  Patient (> 25 years old) will complete five times sit to stand test in < 15 seconds without UE support indicating an increased LE strength and improved balance.    Baseline  6/30: 16 seconds with excessive BUE support 7/29: 15 seconds with SUE support and one LOB 8/19: 14 seconds with SUE support.    Time  8    Period  Weeks    Status  Partially Met    Target Date  10/30/18      PT LONG TERM GOAL #3   Title  Patient will demonstrate an improved Berg Balance Score of >42/56 as to demonstrate improved balance with ADLs such as sitting/standing and transfer balance and reduced fall risk.     Baseline  6/30: 37/56 7/29: 39/56 8/19:  42/56    Time  8    Period  Weeks    Status  Achieved      PT LONG TERM GOAL #4   Title  Patient will improve LEFS to 54/80 to demonstrate improved fucnctional strenth and mobility and ease of indepedence    Baseline  6/30: 32/80 7/29: 39/80 8/19: 41/80    Time  8    Period  Weeks    Status  Partially Met    Target Date  10/30/18      PT LONG TERM GOAL #5   Title  Patient will demonstrate an improved Berg Balance Score of >48/56 as to demonstrate improved balance with ADLs such as sitting/standing and transfer balance and reduced fall risk.    Baseline  8/19: 42/56    Time   8    Period  Weeks    Status  New    Target Date  10/30/18  Plan - 10/08/18 0946    Clinical Impression Statement  In general, patient demonstrating good tolerance to therapy session this date, reasonable accommodations are alllowed in-session to allow adequate rest between activities as needed. All interventional executed without any exacerbation of pain or other symptoms. Pt demonstrates focused motivation to fully participate in therapy to the best of ability. Pt is able to progress some strengthening interventions as well as some balance interventions, although strength deficits remain obvious to author, as well as easy fatiguability.  Pt given intermittent multimodal cues to teach best possible form with exercises. Pt continues to make steady progress toward most goals. No home exercise updates made at this time.       Patient will benefit from skilled therapeutic intervention in order to improve the following deficits and impairments:     Visit Diagnosis: Muscle weakness (generalized)  Unsteadiness on feet  Neurologic gait disorder     Problem List Patient Active Problem List   Diagnosis Date Noted  . Cognitive deficits   . Labile blood pressure   . Acute blood loss anemia   . Slow transit constipation   . Vascular headache   . 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 Hydrocephalus ([place]) 07/03/2017  . 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 Pelvic fracture ([place]) 04/18/2016  . CD (celiac disease) 08/04/2014  . Cancer of upper lobe of right lung ([place]) 08/04/2014  . Age related osteoporosis 11/26/2013  . Absolute anemia 05/21/2013  . H/O malignant neoplasm 05/21/2013  . HLD (hyperlipidemia) 05/21/2013    , C 10/08/2018, 9:51 AM  [place] MAIN [facility] SERVICES [address] [place], [place], [address] Phone: [phone]   Fax:  [phone]  Name: Krista Evans MRN: [id number] Date of Birth:  07/10/1945

## 2018-10-08 NOTE — Therapy (Signed)
[place] MAIN [facility] SERVICES [address] [place], [place], [address] Phone: [phone]   Fax:  [phone]  Physical Therapy Evaluation  Patient Details  Name: Krista Evans MRN: [id number] Date of Birth: 1945/04/04 Referring Provider (PT): Ramonita Lab   Encounter Date: 10/08/2018  PT End of Session - 10/08/18 0944    Visit Number  24    Number of Visits  32    Date for PT Re-Evaluation  10/30/18    Authorization Type  4/10 PN start 9/3    PT Start Time  9163    PT Stop Time  1018    PT Time Calculation (min)  40 min    Equipment Utilized During Treatment  Gait belt    Activity Tolerance  Patient tolerated treatment well;No increased pain;Patient limited by fatigue    Behavior During Therapy  [facility] for tasks assessed/performed       Past Medical History:  Diagnosis Date  . Abnormal Q waves on electrocardiogram   . Anemia   . Aortic atherosclerosis ([place])    "I could possibly have it, my grandmother had it"  . Cancer [facility]) 05/2011   Right upper Lung CA with partial Lobectomy.  . Celiac disease   . Celiac disease   . Dyspnea    with exertion  . Hyperlipidemia   . Hyperlipidemia   . Lung mass   . Meningioma ([place])   . Osteoporosis   . Personal history of chemotherapy   . Personal history of radiation therapy     Past Surgical History:  Procedure Laterality Date  . LUNG LOBECTOMY     right lung  . VENTRICULOPERITONEAL SHUNT Right 07/03/2017   Procedure: SHUNT INSERTION VENTRICULAR-PERITONEAL;  Surgeon: Newman Pies, MD;  Location: [place];  Service: Neurosurgery;  Laterality: Right;    There were no vitals filed for this visit.   Subjective Assessment - 10/08/18 0943    Subjective  Pt doing well this date. Reports she saw her grandson play travel baseball over the weekend. She denies c/o pain but maintains SOB with baseline activity.    Pertinent History  Patient is returning to outpatient physical therapy after closure  for COVID. Due to patient's last session being in March, patient is being re-evaluated today. PMH includes anemia, aortic atherosclerosis, cancer, celiac disease, dyspnea, HLD, meningioma, osteoporosis, history of chemo/radiation therapy, ventriculoperitoneal shunt (07/03/17).    Currently in Pain?  No/denies       TherEx: Scapular retraction 15x3secH Seated RTB marching, cueing for keeping knees wide 20x Seated RTB abduction+ER (band clam, ball between feet)  *cueing to pull with LLE first then right LE 15x  Seated hip IR, yellowTB at ankles, rainbow ball betwixt knees 2x10x3secH Seated TB-loop hamstring curl 15x each LE (RTB Rt; YTB Lt) Soccer ball LAQ with focus on muscle recruitment with coordination and spatial awareness, 1x15 bilat  Neuro Re-ed Fig 8 [facility] Walking x8 RT; severe difficulty with turning right c increased festination, avoids LLE use for turning, c/o dizziness after 5x, but stands with forward gaze x 30sec with resolution.  Airex: no UE support 2x60 seconds; (self selected as wide stance; moderate falls anxiety) Airex pad: horizontal head turns 10x each side, pausing center between;  Airex wide stance vertical heads turns 10x c 3secH. Return to forward gaze *with airex, pt struggles with on/off transition, requring heavy BUE support, consider including this more for intervention in future sessions    Objective measurements completed on examination: See above  findings.     PT Short Term Goals - 09/04/18 0909      PT SHORT TERM GOAL #1   Title  Patient will be independent in HEP demonstrating successful between session carryover and improve functional mobility and ease of completing ADLs.    Baseline  6/30: HEP given next session 7/29: HEP compliant    Time  2    Period  Weeks    Status  Achieved    Target Date  07/30/18      PT SHORT TERM GOAL #2   Title  Patient will be able to transfer sit<>Stand from regular height chair without pushing on arm rests to  exhibit improved independence improved functional strength.     Baseline  6/30: requires excessive BUE support 7/20 : able to perform    Time  2    Period  Weeks    Status  Achieved    Target Date  07/30/18        PT Long Term Goals - 09/04/18 0905      PT LONG TERM GOAL #1   Title  Patient will increase six minute walk test distance to >1000 for progression to community ambulator and improve gait ability    Baseline  6/30: 880 ft with rollator 7/29: 882 ft with 3 minutes w/o rollator 8/19: 880 ft without AD    Time  8    Period  Weeks    Status  Partially Met    Target Date  10/30/18      PT LONG TERM GOAL #2   Title  Patient (> 6 years old) will complete five times sit to stand test in < 15 seconds without UE support indicating an increased LE strength and improved balance.    Baseline  6/30: 16 seconds with excessive BUE support 7/29: 15 seconds with SUE support and one LOB 8/19: 14 seconds with SUE support.    Time  8    Period  Weeks    Status  Partially Met    Target Date  10/30/18      PT LONG TERM GOAL #3   Title  Patient will demonstrate an improved Berg Balance Score of >42/56 as to demonstrate improved balance with ADLs such as sitting/standing and transfer balance and reduced fall risk.     Baseline  6/30: 37/56 7/29: 39/56 8/19:  42/56    Time  8    Period  Weeks    Status  Achieved      PT LONG TERM GOAL #4   Title  Patient will improve LEFS to 54/80 to demonstrate improved fucnctional strenth and mobility and ease of indepedence    Baseline  6/30: 32/80 7/29: 39/80 8/19: 41/80    Time  8    Period  Weeks    Status  Partially Met    Target Date  10/30/18      PT LONG TERM GOAL #5   Title  Patient will demonstrate an improved Berg Balance Score of >48/56 as to demonstrate improved balance with ADLs such as sitting/standing and transfer balance and reduced fall risk.    Baseline  8/19: 42/56    Time  8    Period  Weeks    Status  New    Target Date   10/30/18             Plan - 10/08/18 0946    Clinical Impression Statement  In general, patient demonstrating good tolerance to therapy session  this date, reasonable accommodations are alllowed in-session to allow adequate rest between activities as needed. All interventional executed without any exacerbation of pain or other symptoms. Pt demonstrates focused motivation to fully participate in therapy to the best of ability. Pt is able to progress some strengthening interventions as well as some balance interventions, although strength deficits remain obvious to author, as well as easy fatiguability.  Pt given intermittent multimodal cues to teach best possible form with exercises. Pt continues to make steady progress toward most goals. No home exercise updates made at this time.    Clinical Decision Making  Moderate    Rehab Potential  Fair    Clinical Impairments Affecting Rehab Potential  (+) improvement made within last year, eagerness to participate (-) HEP compliance, safety awareness     PT Frequency  2x / week    PT Duration  8 weeks    PT Treatment/Interventions  ADLs/Self Care Home Management;Cryotherapy;Electrical Stimulation;Iontophoresis 34m/ml Dexamethasone;Moist Heat;Ultrasound;DME Instruction;Gait training;Stair training;Balance training;Therapeutic exercise;Therapeutic activities;Functional mobility training;Neuromuscular re-education;Patient/family education;Manual techniques;Passive range of motion;Energy conservation    PT Next Visit Plan  progress strength and stability    PT Home Exercise Plan  standing hip abduction, standing marches, standing hip extension, standing hamstring curls, mini squats    Consulted and Agree with Plan of Care  Patient       Patient will benefit from skilled therapeutic intervention in order to improve the following deficits and impairments:  Abnormal gait, Decreased activity tolerance, Decreased balance, Decreased coordination, Decreased  endurance, Decreased mobility, Decreased range of motion, Decreased safety awareness, Decreased strength, Difficulty walking, Impaired flexibility, Impaired perceived functional ability, Improper body mechanics, Postural dysfunction, Pain, Impaired UE functional use, Decreased knowledge of precautions, Cardiopulmonary status limiting activity, Decreased knowledge of use of DME  Visit Diagnosis: Muscle weakness (generalized)  Unsteadiness on feet  Neurologic gait disorder     Problem List Patient Active Problem List   Diagnosis Date Noted  . Cognitive deficits   . Labile blood pressure   . Acute blood loss anemia   . Slow transit constipation   . Vascular headache   . 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 [place] 07/03/2017  . [place] 07/03/2017  . Pelvic [place] ([place] 04/18/2016  . CD (celiac disease) 08/04/2014  . [place] 08/04[place]2014  . Age related osteoporosis 11/26/2013  . Absolute anemia 05/21/2013  . H/O malignant neoplasm 05/21/2013  . HLD (hyperlipidemia) 05/21/2013   10:31 AM, 10/08/18 AEtta Grandchild PT, DPT Physical Therapist - [facility] [phone]([address]    BHendersonC 10/08/2018, 10:29 AM  [place] [facility] [address]RP[address] [place] [address]Phone: [phone]  Fax:  [phone] Name: Krista URYMRN: [id number] of Birth: 323-Jul-1947

## 2018-10-09 ENCOUNTER — Ambulatory Visit: Payer: PPO

## 2018-10-10 ENCOUNTER — Ambulatory Visit: Payer: PPO

## 2018-10-10 ENCOUNTER — Other Ambulatory Visit: Payer: Self-pay

## 2018-10-10 DIAGNOSIS — M6281 Muscle weakness (generalized): Secondary | ICD-10-CM | POA: Diagnosis not present

## 2018-10-10 DIAGNOSIS — R2681 Unsteadiness on feet: Secondary | ICD-10-CM

## 2018-10-10 DIAGNOSIS — R269 Unspecified abnormalities of gait and mobility: Secondary | ICD-10-CM

## 2018-10-10 NOTE — Therapy (Signed)
[place] MAIN [facility] SERVICES [address] [place], [place], [address] Phone: [phone]   Fax:  [phone]  Physical Therapy Treatment  Patient Details  Name: Krista Evans MRN: [id number] Date of Birth: 03-Jan-1946 Referring Provider (PT): Ramonita Lab   Encounter Date: 10/10/2018  PT End of Session - 10/10/18 1023    Visit Number  25    Number of Visits  32    Date for PT Re-Evaluation  10/30/18    Authorization Type  5/10 PN start 9/3    PT Start Time  0933    PT Stop Time  1018    PT Time Calculation (min)  45 min    Equipment Utilized During Treatment  Gait belt    Activity Tolerance  Patient tolerated treatment well;No increased pain;Patient limited by fatigue    Behavior During Therapy  [facility] for tasks assessed/performed       Past Medical History:  Diagnosis Date  . Abnormal Q waves on electrocardiogram   . Anemia   . Aortic atherosclerosis ([place])    "I could possibly have it, my grandmother had it"  . Cancer [facility]) 05/2011   Right upper Lung CA with partial Lobectomy.  . Celiac disease   . Celiac disease   . Dyspnea    with exertion  . Hyperlipidemia   . Hyperlipidemia   . Lung mass   . Meningioma ([place])   . Osteoporosis   . Personal history of chemotherapy   . Personal history of radiation therapy     Past Surgical History:  Procedure Laterality Date  . LUNG LOBECTOMY     right lung  . 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  Subjective Assessment - 10/10/18 1021    Subjective  Pt reports no pain anywhere upon arrival and no problems since her last visit.  She reports doing her home exercises and reports no concerns with them.    Pertinent History  Patient is returning to outpatient physical therapy after closure for  COVID. Due to patient's last session being in March, patient is being re-evaluated today. PMH includes anemia, aortic atherosclerosis, cancer, celiac disease, dyspnea, HLD, meningioma, osteoporosis, history of chemo/radiation therapy, ventriculoperitoneal shunt (07/03/17).    Limitations  Lifting;Standing;Walking;House hold activities;Other (comment)    Currently in Pain?  No/denies    Multiple Pain Sites  No           Treatment:  NMR:  Tandem stance in // bars with fingertip hold; SLS in // bars with fingertip hold; Alt toe tapping on 4" step with heavy verbal cueing for correct technique with 2-finger hold on bar; narrow BOS EO/EC on AirEx foam pad with arms across torso  Therapeutic Exercise: seated hip flexion and abd both with RTB 20x B; seated HS curls with BTB on R and RTB on L 2x10; LAQs with 1.5# ankle wts 2x10 B; seated hip add with blue soccer ball 20x3"; standing heel/toes raises 20x; standing hip extension with 1.5# ankle wts 2x10 B.                    PT Education - 10/10/18 1022    Education provided  Yes    Education Details  Exercise technique; body mechanics; kitchen sink ex's for balance (tandem stance, single leg stance)  Person(s) Educated  Patient    Methods  Explanation;Tactile cues;Verbal cues    Comprehension  Verbalized understanding;Returned demonstration       PT Short Term Goals - 09/04/18 0909      PT SHORT TERM GOAL #1   Title  Patient will be independent in HEP demonstrating successful between session carryover and improve functional mobility and ease of completing ADLs.    Baseline  6/30: HEP given next session 7/29: HEP compliant    Time  2    Period  Weeks    Status  Achieved    Target Date  07/30/18      PT SHORT TERM GOAL #2   Title  Patient will be able to transfer sit<>Stand from regular height chair without pushing on arm rests to exhibit improved independence improved functional strength.     Baseline  6/30: requires  excessive BUE support 7/20 : able to perform    Time  2    Period  Weeks    Status  Achieved    Target Date  07/30/18        PT Long Term Goals - 09/04/18 0905      PT LONG TERM GOAL #1   Title  Patient will increase six minute walk test distance to >1000 for progression to community ambulator and improve gait ability    Baseline  6/30: 880 ft with rollator 7/29: 882 ft with 3 minutes w/o rollator 8/19: 880 ft without AD    Time  8    Period  Weeks    Status  Partially Met    Target Date  10/30/18      PT LONG TERM GOAL #2   Title  Patient (> 1 years old) will complete five times sit to stand test in < 15 seconds without UE support indicating an increased LE strength and improved balance.    Baseline  6/30: 16 seconds with excessive BUE support 7/29: 15 seconds with SUE support and one LOB 8/19: 14 seconds with SUE support.    Time  8    Period  Weeks    Status  Partially Met    Target Date  10/30/18      PT LONG TERM GOAL #3   Title  Patient will demonstrate an improved Berg Balance Score of >42/56 as to demonstrate improved balance with ADLs such as sitting/standing and transfer balance and reduced fall risk.     Baseline  6/30: 37/56 7/29: 39/56 8/19:  42/56    Time  8    Period  Weeks    Status  Achieved      PT LONG TERM GOAL #4   Title  Patient will improve LEFS to 54/80 to demonstrate improved fucnctional strenth and mobility and ease of indepedence    Baseline  6/30: 32/80 7/29: 39/80 8/19: 41/80    Time  8    Period  Weeks    Status  Partially Met    Target Date  10/30/18      PT LONG TERM GOAL #5   Title  Patient will demonstrate an improved Berg Balance Score of >48/56 as to demonstrate improved balance with ADLs such as sitting/standing and transfer balance and reduced fall risk.    Baseline  8/19: 42/56    Time  8    Period  Weeks    Status  New    Target Date  10/30/18            Plan -  10/10/18 1024    Clinical Impression Statement  Pt  exhibits L sided LE weakness with standing balance activities and seated/standing LE strengthening ex's.  She needs verbal cueing to correctly perform both NMR activities and strengthening ther ex's with correct technique. Continue with balance activities and strengthening at next visit.    Personal Factors and Comorbidities  Age;Comorbidity 3+;Fitness;Past/Current Experience;Social Background;Time since onset of injury/illness/exacerbation    Examination-Activity Limitations  Bed Mobility;Bathing;Bend;Carry;Dressing;Hygiene/Grooming;Locomotion Level;Reach Overhead;Sit;Squat;Stairs;Stand;Toileting;Transfers    Examination-Participation Restrictions  Church;Cleaning;Community Activity;Driving;Interpersonal Relationship;Meal Prep;Laundry;Shop;Volunteer;Yard Work    [employer] officer  Evolving/Moderate complexity    Clinical Decision Making  Moderate    Rehab Potential  Fair    Clinical Impairments Affecting Rehab Potential  (+) improvement made within last year, eagerness to participate (-) HEP compliance, safety awareness     PT Frequency  2x / week    PT Duration  8 weeks    PT Treatment/Interventions  ADLs/Self Care Home Management;Cryotherapy;Electrical Stimulation;Iontophoresis 59m/ml Dexamethasone;Moist Heat;Ultrasound;DME Instruction;Gait training;Stair training;Balance training;Therapeutic exercise;Therapeutic activities;Functional mobility training;Neuromuscular re-education;Patient/family education;Manual techniques;Passive range of motion;Energy conservation    PT Next Visit Plan  progress strength and stability    PT Home Exercise Plan  standing hip abduction, standing marches, standing hip extension, standing hamstring curls, mini squats; kitchen sink tandem stance and SLS    Consulted and Agree with Plan of Care  Patient       Patient will benefit from skilled therapeutic intervention in order to improve the following deficits and impairments:  Abnormal gait, Decreased  activity tolerance, Decreased balance, Decreased coordination, Decreased endurance, Decreased mobility, Decreased range of motion, Decreased safety awareness, Decreased strength, Difficulty walking, Impaired flexibility, Impaired perceived functional ability, Improper body mechanics, Postural dysfunction, Pain, Impaired UE functional use, Decreased knowledge of precautions, Cardiopulmonary status limiting activity, Decreased knowledge of use of DME  Visit Diagnosis: Muscle weakness (generalized)  Unsteadiness on feet  Neurologic gait disorder     Problem List Patient Active Problem List   Diagnosis Date Noted  . Cognitive deficits   . Labile blood pressure   . Acute blood loss anemia   . Slow transit constipation   . Vascular headache   . 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 [place]cephalus ([place] 07/03/2017  . 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 Pelvic fracture ([place] 04/18/2016  . CD (celiac disease) 08/04/2014  . Cancer of [place] lobe of right lung ([place] 08/04/2014  . Age related osteoporosis 11/26/2013  . Absolute anemia 05/21/2013  . H/O malignant neoplasm 05/21/2013  . HLD (hyperlipidemia) 05/21/2013    Myliyah Rebuck, MPT 10/10/2018, 10:27 AM  [place] [facility] [address] [place] [address]Phone: [phone]  Fax:  [phone] Name: MHUSNA KRONEMRN: [id number] of Birth: 3March 22, 1947

## 2018-10-14 DIAGNOSIS — H2513 Age-related nuclear cataract, bilateral: Secondary | ICD-10-CM | POA: Diagnosis not present

## 2018-10-15 ENCOUNTER — Ambulatory Visit: Payer: PPO

## 2018-10-15 ENCOUNTER — Other Ambulatory Visit: Payer: Self-pay

## 2018-10-15 DIAGNOSIS — M6281 Muscle weakness (generalized): Secondary | ICD-10-CM | POA: Diagnosis not present

## 2018-10-15 DIAGNOSIS — R269 Unspecified abnormalities of gait and mobility: Secondary | ICD-10-CM

## 2018-10-15 DIAGNOSIS — R2681 Unsteadiness on feet: Secondary | ICD-10-CM

## 2018-10-15 NOTE — Therapy (Signed)
[place] MAIN [facility] SERVICES [address] [place], [place], [address] Phone: [phone]   Fax:  [phone]  Physical Therapy Treatment  Patient Details  Name: Krista Evans MRN: [id number] Date of Birth: 26-Feb-1945 Referring Provider (PT): Ramonita Lab   Encounter Date: 10/15/2018  PT End of Session - 10/15/18 0936    Visit Number  26    Number of Visits  32    Date for PT Re-Evaluation  10/30/18    Authorization Type  6/10 PN start 9/3    PT Start Time  0930    PT Stop Time  1014    PT Time Calculation (min)  44 min    Equipment Utilized During Treatment  Gait belt    Activity Tolerance  Patient tolerated treatment well;No increased pain;Patient limited by fatigue    Behavior During Therapy  [facility] for tasks assessed/performed       Past Medical History:  Diagnosis Date  . Abnormal Q waves on electrocardiogram   . Anemia   . Aortic atherosclerosis ([place])    "I could possibly have it, my grandmother had it"  . Cancer [facility]) 05/2011   Right upper Lung CA with partial Lobectomy.  . Celiac disease   . Celiac disease   . Dyspnea    with exertion  . Hyperlipidemia   . Hyperlipidemia   . Lung mass   . Meningioma ([place])   . Osteoporosis   . Personal history of chemotherapy   . Personal history of radiation therapy     Past Surgical History:  Procedure Laterality Date  . LUNG LOBECTOMY     right lung  . 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  Subjective Assessment - 10/15/18 0935    Subjective  Patient reports she has been doing her walking HEP with her husband. Has been compliant with other HEP, no falls or LOB since last session.    Pertinent History  Patient is returning to outpatient physical therapy after closure for COVID. Due to  patient's last session being in March, patient is being re-evaluated today. PMH includes anemia, aortic atherosclerosis, cancer, celiac disease, dyspnea, HLD, meningioma, osteoporosis, history of chemo/radiation therapy, ventriculoperitoneal shunt (07/03/17).    Limitations  Lifting;Standing;Walking;House hold activities;Other (comment)    Currently in Pain?  No/denies            Nustep level 3 3 minutes, RPM>60 for cardiovascular and musculoskeletal challenge.    TherEx:     Ambulate in hallway>1000 ftwithnoUE supportone nearLOB when stopping ambulation requiring assistance to maintain stability; no standing rest breaks required for the first time. Improved gait mechanics and arm swing. ; CGA with cueing for direction and posture.  Scapular retractions 15x   Seated RTB marching, cueing for keeping knees apart. 20x, upright posture, challenge to not lean back.    Seated RTB abduction, one LE at a time, 15x each side.    Seated RTB hamstring curl 15x each LE    Seated RTB around ankles ER/IR 15x each LE   seated modified windmills 8x each side  Seated balloon taps reaching inside/outside BOS  2 minutes for coordination and spatial awareness  Neuro Re-ed  airex pad: horizontal head turns 30 seconds, vertical heads turns 30 seconds   airex pad Standing toe taps on 7" step with  UE support, cueing for control of support limb x 20 each LE. ; improved control of LLE with foot placement    airex pad: lateral toe taps on 7" step with SUE support, 12x each LE, challenged stabilizing on LLE   Soccer ball LAQ with focus on muscle recruitment timing with coordination and spatial awareness x 2 minutes    Pt educated throughout session about proper posture and technique with exercises. Improved exercise technique, movement at target joints, use of target muscles after min to mod verbal, visual, tactile cues.                          PT Education - 10/15/18 0935     Education provided  Yes    Education Details  exercise technique, body mechanics    Person(s) Educated  Patient    Methods  Explanation;Demonstration;Tactile cues;Verbal cues    Comprehension  Verbalized understanding;Returned demonstration;Verbal cues required;Tactile cues required       PT Short Term Goals - 09/04/18 0909      PT SHORT TERM GOAL #1   Title  Patient will be independent in HEP demonstrating successful between session carryover and improve functional mobility and ease of completing ADLs.    Baseline  6/30: HEP given next session 7/29: HEP compliant    Time  2    Period  Weeks    Status  Achieved    Target Date  07/30/18      PT SHORT TERM GOAL #2   Title  Patient will be able to transfer sit<>Stand from regular height chair without pushing on arm rests to exhibit improved independence improved functional strength.     Baseline  6/30: requires excessive BUE support 7/20 : able to perform    Time  2    Period  Weeks    Status  Achieved    Target Date  07/30/18        PT Long Term Goals - 09/04/18 0905      PT LONG TERM GOAL #1   Title  Patient will increase six minute walk test distance to >1000 for progression to community ambulator and improve gait ability    Baseline  6/30: 880 ft with rollator 7/29: 882 ft with 3 minutes w/o rollator 8/19: 880 ft without AD    Time  8    Period  Weeks    Status  Partially Met    Target Date  10/30/18      PT LONG TERM GOAL #2   Title  Patient (> 75 years old) will complete five times sit to stand test in < 15 seconds without UE support indicating an increased LE strength and improved balance.    Baseline  6/30: 16 seconds with excessive BUE support 7/29: 15 seconds with SUE support and one LOB 8/19: 14 seconds with SUE support.    Time  8    Period  Weeks    Status  Partially Met    Target Date  10/30/18      PT LONG TERM GOAL #3   Title  Patient will demonstrate an improved Berg Balance Score of >42/56 as to  demonstrate improved balance with ADLs such as sitting/standing and transfer balance and reduced fall risk.     Baseline  6/30: 37/56 7/29: 39/56 8/19:  42/56    Time  8    Period  Weeks    Status  Achieved  PT LONG TERM GOAL #4   Title  Patient will improve LEFS to 54/80 to demonstrate improved fucnctional strenth and mobility and ease of indepedence    Baseline  6/30: 32/80 7/29: 39/80 8/19: 41/80    Time  8    Period  Weeks    Status  Partially Met    Target Date  10/30/18      PT LONG TERM GOAL #5   Title  Patient will demonstrate an improved Berg Balance Score of >48/56 as to demonstrate improved balance with ADLs such as sitting/standing and transfer balance and reduced fall risk.    Baseline  8/19: 42/56    Time  8    Period  Weeks    Status  New    Target Date  10/30/18            Plan - 10/15/18 0953    Clinical Impression Statement  Patient presents with good motivation to physical therapy session. Improved ability to negotiate turns when ambulating performed however patient continues to be fearful of LOB requiring SHA for stepping up. Patient is out of breathe after interventions that are more fearful for her due to tendency to hold breath. . The patient would benefit from further skilled PT to maximize pt functional activities, safety, and QOL    Personal Factors and Comorbidities  Age;Comorbidity 3+;Fitness;Past/Current Experience;Social Background;Time since onset of injury/illness/exacerbation    Examination-Activity Limitations  Bed Mobility;Bathing;Bend;Carry;Dressing;Hygiene/Grooming;Locomotion Level;Reach Overhead;Sit;Squat;Stairs;Stand;Toileting;Transfers    Examination-Participation Restrictions  Church;Cleaning;Community Activity;Driving;Interpersonal Relationship;Meal Prep;Laundry;Shop;Volunteer;Yard Work    [employer] officer  Evolving/Moderate complexity    Rehab Potential  Fair    Clinical Impairments Affecting Rehab Potential  (+)  improvement made within last year, eagerness to participate (-) HEP compliance, safety awareness     PT Frequency  2x / week    PT Duration  8 weeks    PT Treatment/Interventions  ADLs/Self Care Home Management;Cryotherapy;Electrical Stimulation;Iontophoresis 35m/ml Dexamethasone;Moist Heat;Ultrasound;DME Instruction;Gait training;Stair training;Balance training;Therapeutic exercise;Therapeutic activities;Functional mobility training;Neuromuscular re-education;Patient/family education;Manual techniques;Passive range of motion;Energy conservation    PT Next Visit Plan  progress strength and stability    PT Home Exercise Plan  standing hip abduction, standing marches, standing hip extension, standing hamstring curls, mini squats; kitchen sink tandem stance and SLS    Consulted and Agree with Plan of Care  Patient       Patient will benefit from skilled therapeutic intervention in order to improve the following deficits and impairments:  Abnormal gait, Decreased activity tolerance, Decreased balance, Decreased coordination, Decreased endurance, Decreased mobility, Decreased range of motion, Decreased safety awareness, Decreased strength, Difficulty walking, Impaired flexibility, Impaired perceived functional ability, Improper body mechanics, Postural dysfunction, Pain, Impaired UE functional use, Decreased knowledge of precautions, Cardiopulmonary status limiting activity, Decreased knowledge of use of DME  Visit Diagnosis: Muscle weakness (generalized)  Unsteadiness on feet  Neurologic gait disorder     Problem List Patient Active Problem List   Diagnosis Date Noted  . Cognitive deficits   . Labile blood pressure   . Acute blood loss anemia   . Slow transit constipation   . Vascular headache   . 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 Hydrocephalus ([place] 07/03/2017  . 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 Pelvic fracture ([place] 04/18/2016  . CD (celiac disease) 08/04/2014  .  Cancer of upper lobe of right lung ([place] 08/04/2014  . Age related osteoporosis 11/26/2013  . Absolute anemia 05/21/2013  . H/O malignant neoplasm 05/21/2013  . HLD (hyperlipidemia) 05/21/2013   MLenda Kelp  Ermalene Postin, PT, DPT   10/15/2018, 10:20 AM  [place] MAIN [facility] SERVICES [address] [place], [place], [address] Phone: [phone]   Fax:  [phone]  Name: Krista Evans MRN: [id number] Date of Birth: 1945/11/26

## 2018-10-17 ENCOUNTER — Other Ambulatory Visit: Payer: Self-pay

## 2018-10-17 ENCOUNTER — Ambulatory Visit: Payer: PPO | Attending: Internal Medicine

## 2018-10-17 DIAGNOSIS — R2681 Unsteadiness on feet: Secondary | ICD-10-CM | POA: Diagnosis not present

## 2018-10-17 DIAGNOSIS — R269 Unspecified abnormalities of gait and mobility: Secondary | ICD-10-CM | POA: Insufficient documentation

## 2018-10-17 DIAGNOSIS — M6281 Muscle weakness (generalized): Secondary | ICD-10-CM | POA: Diagnosis not present

## 2018-10-17 NOTE — Therapy (Signed)
[place] MAIN [facility] SERVICES [address] [place], [place], [address] Phone: [phone]   Fax:  [phone]  Physical Therapy Treatment  Patient Details  Name: Krista Evans MRN: [id number] Date of Birth: 03/04/1945 Referring Provider (PT): Ramonita Lab   Encounter Date: 10/17/2018  PT End of Session - 10/17/18 0941    Visit Number  27    Number of Visits  32    Date for PT Re-Evaluation  10/30/18    Authorization Type  7/10 PN start 9/3    PT Start Time  0931    PT Stop Time  1016    PT Time Calculation (min)  45 min    Equipment Utilized During Treatment  Gait belt    Activity Tolerance  Patient tolerated treatment well;No increased pain;Patient limited by fatigue    Behavior During Therapy  [facility] for tasks assessed/performed       Past Medical History:  Diagnosis Date  . Abnormal Q waves on electrocardiogram   . Anemia   . Aortic atherosclerosis ([place])    "I could possibly have it, my grandmother had it"  . Cancer [facility]) 05/2011   Right upper Lung CA with partial Lobectomy.  . Celiac disease   . Celiac disease   . Dyspnea    with exertion  . Hyperlipidemia   . Hyperlipidemia   . Lung mass   . Meningioma ([place])   . Osteoporosis   . Personal history of chemotherapy   . Personal history of radiation therapy     Past Surgical History:  Procedure Laterality Date  . LUNG LOBECTOMY     right lung  . 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  Subjective Assessment - 10/17/18 0940    Subjective  Patient reports during her walk with her husband yesterday she was feeling more sore on her R leg than her left. No falls or LOB since last session.    Pertinent History  Patient is returning to outpatient physical therapy after closure for  COVID. Due to patient's last session being in March, patient is being re-evaluated today. PMH includes anemia, aortic atherosclerosis, cancer, celiac disease, dyspnea, HLD, meningioma, osteoporosis, history of chemo/radiation therapy, ventriculoperitoneal shunt (07/03/17).    Limitations  Lifting;Standing;Walking;House hold activities;Other (comment)    Currently in Pain?  No/denies            TherEx:    Quantum leg press: BLE; #10512x ; cueing for keeping RLE neutral due to preference for adduction, cueing for slow velocity for maximal muscle contraction; 2 sets second set #120, 10x; ball between knees to promote neutral alignment and correct muscle activation  Quantum leg press:BLE #60, 10x 2 sets cueing for keeping RLE neutral due to preference for adduction, cueing for slow velocity for maximal muscle contraction;   Scapular retractions 15x; combined with hands on knees sliding to knees and hips with concurrent purse lipped breathing.    Seated RTB abduction, one LE at a time, 15x each side.    Seated gluteal squeezes 10x 3 second holds     Neuro Re-ed   airex pad: one foot on 7" step one foot on airex pad, 30 seconds each LE, max encouragement and cueing, Min A occasionally, 2 sets   Standing toe taps on 7" step with UE support, cueing  for control of support limb x 20 each LE. ; improved control of LLE with foot placement; reduction of support to two finger support.    Seated balloon taps reaching inside/outside BOS  2 minutes for coordination and spatial awareness     balloon LAQ with focus on muscle recruitment timing with coordination and spatial awareness x 2 minutes  Balloon hip flexion 15x each LE, focus on recruitment timing, coordination, and spatial awareness.     Pt educated throughout session about proper posture and technique with exercises. Improved exercise technique, movement at target joints, use of target muscles after min to mod verbal, visual, tactile  cues.                         PT Education - 10/17/18 0941    Education provided  Yes    Education Details  exercise techinque, body mechanics    Person(s) Educated  Patient    Methods  Explanation;Demonstration;Tactile cues;Verbal cues    Comprehension  Verbalized understanding;Returned demonstration;Verbal cues required;Tactile cues required       PT Short Term Goals - 09/04/18 0909      PT SHORT TERM GOAL #1   Title  Patient will be independent in HEP demonstrating successful between session carryover and improve functional mobility and ease of completing ADLs.    Baseline  6/30: HEP given next session 7/29: HEP compliant    Time  2    Period  Weeks    Status  Achieved    Target Date  07/30/18      PT SHORT TERM GOAL #2   Title  Patient will be able to transfer sit<>Stand from regular height chair without pushing on arm rests to exhibit improved independence improved functional strength.     Baseline  6/30: requires excessive BUE support 7/20 : able to perform    Time  2    Period  Weeks    Status  Achieved    Target Date  07/30/18        PT Long Term Goals - 09/04/18 0905      PT LONG TERM GOAL #1   Title  Patient will increase six minute walk test distance to >1000 for progression to community ambulator and improve gait ability    Baseline  6/30: 880 ft with rollator 7/29: 882 ft with 3 minutes w/o rollator 8/19: 880 ft without AD    Time  8    Period  Weeks    Status  Partially Met    Target Date  10/30/18      PT LONG TERM GOAL #2   Title  Patient (> 23 years old) will complete five times sit to stand test in < 15 seconds without UE support indicating an increased LE strength and improved balance.    Baseline  6/30: 16 seconds with excessive BUE support 7/29: 15 seconds with SUE support and one LOB 8/19: 14 seconds with SUE support.    Time  8    Period  Weeks    Status  Partially Met    Target Date  10/30/18      PT LONG TERM GOAL #3    Title  Patient will demonstrate an improved Berg Balance Score of >42/56 as to demonstrate improved balance with ADLs such as sitting/standing and transfer balance and reduced fall risk.     Baseline  6/30: 37/56 7/29: 39/56 8/19:  42/56    Time  8  Period  Weeks    Status  Achieved      PT LONG TERM GOAL #4   Title  Patient will improve LEFS to 54/80 to demonstrate improved fucnctional strenth and mobility and ease of indepedence    Baseline  6/30: 32/80 7/29: 39/80 8/19: 41/80    Time  8    Period  Weeks    Status  Partially Met    Target Date  10/30/18      PT LONG TERM GOAL #5   Title  Patient will demonstrate an improved Berg Balance Score of >48/56 as to demonstrate improved balance with ADLs such as sitting/standing and transfer balance and reduced fall risk.    Baseline  8/19: 42/56    Time  8    Period  Weeks    Status  New    Target Date  10/30/18            Plan - 10/17/18 0946    Clinical Impression Statement  Patient presents with slight fatigue to today's physical therapy session.  Is improving her strength as seen in weight increase and set number increase with strengthening interventions. LLE fatigues quicker than RLE.. The patient would benefit from further skilled PT to maximize pt functional activities, safety, and QOL    Personal Factors and Comorbidities  Age;Comorbidity 3+;Fitness;Past/Current Experience;Social Background;Time since onset of injury/illness/exacerbation    Examination-Activity Limitations  Bed Mobility;Bathing;Bend;Carry;Dressing;Hygiene/Grooming;Locomotion Level;Reach Overhead;Sit;Squat;Stairs;Stand;Toileting;Transfers    Examination-Participation Restrictions  Church;Cleaning;Community Activity;Driving;Interpersonal Relationship;Meal Prep;Laundry;Shop;Volunteer;Yard Work    [employer] officer  Evolving/Moderate complexity    Rehab Potential  Fair    Clinical Impairments Affecting Rehab Potential  (+) improvement made within  last year, eagerness to participate (-) HEP compliance, safety awareness     PT Frequency  2x / week    PT Duration  8 weeks    PT Treatment/Interventions  ADLs/Self Care Home Management;Cryotherapy;Electrical Stimulation;Iontophoresis 51m/ml Dexamethasone;Moist Heat;Ultrasound;DME Instruction;Gait training;Stair training;Balance training;Therapeutic exercise;Therapeutic activities;Functional mobility training;Neuromuscular re-education;Patient/family education;Manual techniques;Passive range of motion;Energy conservation    PT Next Visit Plan  progress strength and stability    PT Home Exercise Plan  standing hip abduction, standing marches, standing hip extension, standing hamstring curls, mini squats; kitchen sink tandem stance and SLS    Consulted and Agree with Plan of Care  Patient       Patient will benefit from skilled therapeutic intervention in order to improve the following deficits and impairments:  Abnormal gait, Decreased activity tolerance, Decreased balance, Decreased coordination, Decreased endurance, Decreased mobility, Decreased range of motion, Decreased safety awareness, Decreased strength, Difficulty walking, Impaired flexibility, Impaired perceived functional ability, Improper body mechanics, Postural dysfunction, Pain, Impaired UE functional use, Decreased knowledge of precautions, Cardiopulmonary status limiting activity, Decreased knowledge of use of DME  Visit Diagnosis: Muscle weakness (generalized)  Unsteadiness on feet  Neurologic gait disorder     Problem List Patient Active Problem List   Diagnosis Date Noted  . Cognitive deficits   . Labile blood pressure   . Acute blood loss anemia   . Slow transit constipation   . Vascular headache   . 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 [place] 07/03/2017  . 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 Pelvic fracture ([place] 04/18/2016  . CD (celiac disease) 08/04/2014  . Cancer of upper lobe of  right lung ([place] 08/04/2014  . Age related osteoporosis 11/26/2013  . Absolute anemia 05/21/2013  . H/O malignant neoplasm 05/21/2013  . HLD (hyperlipidemia) 05/21/2013    MJanna Arch PT, DPT  10/17/2018, 10:19 AM  [place] MAIN [facility] SERVICES [address] [place], [place], [address] Phone: [phone]   Fax:  [phone]  Name: Krista Evans MRN: [id number] Date of Birth: [birth year]

## 2018-10-22 ENCOUNTER — Other Ambulatory Visit: Payer: Self-pay

## 2018-10-22 ENCOUNTER — Ambulatory Visit: Payer: PPO

## 2018-10-22 DIAGNOSIS — M6281 Muscle weakness (generalized): Secondary | ICD-10-CM | POA: Diagnosis not present

## 2018-10-22 DIAGNOSIS — Z23 Encounter for immunization: Secondary | ICD-10-CM | POA: Diagnosis not present

## 2018-10-22 DIAGNOSIS — R2681 Unsteadiness on feet: Secondary | ICD-10-CM

## 2018-10-22 DIAGNOSIS — R269 Unspecified abnormalities of gait and mobility: Secondary | ICD-10-CM

## 2018-10-22 NOTE — Therapy (Signed)
[place] MAIN [facility] SERVICES [address] [place], [place], [address] Phone: [phone]   Fax:  [phone]  Physical Therapy Treatment  Patient Details  Name: Krista Evans MRN: [id number] Date of Birth: Jun 26, 1945 Referring Provider (PT): Ramonita Lab   Encounter Date: 10/22/2018  PT End of Session - 10/22/18 0935    Visit Number  28    Number of Visits  32    Date for PT Re-Evaluation  10/30/18    Authorization Type  7/10 PN start 9/3    PT Start Time  0930    PT Stop Time  1014    PT Time Calculation (min)  44 min    Equipment Utilized During Treatment  Gait belt    Activity Tolerance  Patient tolerated treatment well;No increased pain;Patient limited by fatigue    Behavior During Therapy  [facility] for tasks assessed/performed       Past Medical History:  Diagnosis Date  . Abnormal Q waves on electrocardiogram   . Anemia   . Aortic atherosclerosis ([place])    "I could possibly have it, my grandmother had it"  . Cancer [facility]) 05/2011   Right upper Lung CA with partial Lobectomy.  . Celiac disease   . Celiac disease   . Dyspnea    with exertion  . Hyperlipidemia   . Hyperlipidemia   . Lung mass   . Meningioma ([place])   . Osteoporosis   . Personal history of chemotherapy   . Personal history of radiation therapy     Past Surgical History:  Procedure Laterality Date  . LUNG LOBECTOMY     right lung  . 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  Subjective Assessment - 10/22/18 0934    Subjective  Patient reports compliance with HEP, has been walking every late afternoon with her husband. No falls or LOB since last session, no pain this session.    Pertinent History  Patient is returning to outpatient physical therapy after closure for  COVID. Due to patient's last session being in March, patient is being re-evaluated today. PMH includes anemia, aortic atherosclerosis, cancer, celiac disease, dyspnea, HLD, meningioma, osteoporosis, history of chemo/radiation therapy, ventriculoperitoneal shunt (07/03/17).    Limitations  Lifting;Standing;Walking;House hold activities;Other (comment)    Currently in Pain?  No/denies          TherEx:    Quantum leg press: BLE;  #120 12x ;2 sets cueing for keeping RLE neutral due to preference for adduction, cueing for slow velocity for maximal muscle contraction ;  ball between knees to promote neutral alignment and correct muscle activation    Quantum leg press:BLE #60, 10x each LE cueing for keeping RLE neutral due to preference for adduction, cueing for slow velocity for maximal muscle contraction ;     Scapular retractions 15x; combined with hands on knees sliding to knees and hips with concurrent purse lipped breathing.    Seated RTB abduction, one LE at a time, 20x each side.    Seated gluteal squeezes 10x 3 second holds   Modified seated windmills 8x each side  Seated arm raises forward and laterally, focusing on equal activation of L and R, equal velocity is challenging and requires frequent cueing.    Seated 2lb ankle weights: -marches 15x each LE, cueing for keeping ankles/feet  wide for increased BOS -seated LAQ 12x  -IR/ER visual cueing required for sequencing 15x  Ambulate around room scanning up/down with horizontal turns for cones. Occasional LOB reaching for cones requiring Min A to retain COM    Pt educated throughout session about proper posture and technique with exercises. Improved exercise technique, movement at target joints, use of target muscles after min to mod verbal, visual, tactile cues.                     PT Education - 10/22/18 0935    Education provided  Yes    Education Details  exercise technique, body mechanics    Person(s)  Educated  Patient    Methods  Explanation;Demonstration;Tactile cues;Verbal cues    Comprehension  Verbalized understanding;Returned demonstration;Verbal cues required;Tactile cues required       PT Short Term Goals - 09/04/18 0909      PT SHORT TERM GOAL #1   Title  Patient will be independent in HEP demonstrating successful between session carryover and improve functional mobility and ease of completing ADLs.    Baseline  6/30: HEP given next session 7/29: HEP compliant    Time  2    Period  Weeks    Status  Achieved    Target Date  07/30/18      PT SHORT TERM GOAL #2   Title  Patient will be able to transfer sit<>Stand from regular height chair without pushing on arm rests to exhibit improved independence improved functional strength.     Baseline  6/30: requires excessive BUE support 7/20 : able to perform    Time  2    Period  Weeks    Status  Achieved    Target Date  07/30/18        PT Long Term Goals - 09/04/18 0905      PT LONG TERM GOAL #1   Title  Patient will increase six minute walk test distance to >1000 for progression to community ambulator and improve gait ability    Baseline  6/30: 880 ft with rollator 7/29: 882 ft with 3 minutes w/o rollator 8/19: 880 ft without AD    Time  8    Period  Weeks    Status  Partially Met    Target Date  10/30/18      PT LONG TERM GOAL #2   Title  Patient (> 22 years old) will complete five times sit to stand test in < 15 seconds without UE support indicating an increased LE strength and improved balance.    Baseline  6/30: 16 seconds with excessive BUE support 7/29: 15 seconds with SUE support and one LOB 8/19: 14 seconds with SUE support.    Time  8    Period  Weeks    Status  Partially Met    Target Date  10/30/18      PT LONG TERM GOAL #3   Title  Patient will demonstrate an improved Berg Balance Score of >42/56 as to demonstrate improved balance with ADLs such as sitting/standing and transfer balance and reduced fall  risk.     Baseline  6/30: 37/56 7/29: 39/56 8/19:  42/56    Time  8    Period  Weeks    Status  Achieved      PT LONG TERM GOAL #4   Title  Patient will improve LEFS to 54/80 to demonstrate improved fucnctional strenth and mobility and ease of indepedence  Baseline  6/30: 32/80 7/29: 39/80 8/19: 41/80    Time  8    Period  Weeks    Status  Partially Met    Target Date  10/30/18      PT LONG TERM GOAL #5   Title  Patient will demonstrate an improved Berg Balance Score of >48/56 as to demonstrate improved balance with ADLs such as sitting/standing and transfer balance and reduced fall risk.    Baseline  8/19: 42/56    Time  8    Period  Weeks    Status  New    Target Date  10/30/18            Plan - 10/22/18 0959    Clinical Impression Statement  Patient presents with good motivation to today's physical therapy session. Patient is progressing with LE strength however fatigues quicker with heavier weights requiring prolonged rest breaks to "catch her breath". It is challenging for patient to scan a room and reach for objects without LOB. The patient would benefit from further skilled PT to maximize pt functional activities, safety, and QOL    Personal Factors and Comorbidities  Age;Comorbidity 3+;Fitness;Past/Current Experience;Social Background;Time since onset of injury/illness/exacerbation    Examination-Activity Limitations  Bed Mobility;Bathing;Bend;Carry;Dressing;Hygiene/Grooming;Locomotion Level;Reach Overhead;Sit;Squat;Stairs;Stand;Toileting;Transfers    Examination-Participation Restrictions  Church;Cleaning;Community Activity;Driving;Interpersonal Relationship;Meal Prep;Laundry;Shop;Volunteer;Yard Work    [employer] officer  Evolving/Moderate complexity    Rehab Potential  Fair    Clinical Impairments Affecting Rehab Potential  (+) improvement made within last year, eagerness to participate (-) HEP compliance, safety awareness     PT Frequency  2x / week     PT Duration  8 weeks    PT Treatment/Interventions  ADLs/Self Care Home Management;Cryotherapy;Electrical Stimulation;Iontophoresis 78m/ml Dexamethasone;Moist Heat;Ultrasound;DME Instruction;Gait training;Stair training;Balance training;Therapeutic exercise;Therapeutic activities;Functional mobility training;Neuromuscular re-education;Patient/family education;Manual techniques;Passive range of motion;Energy conservation    PT Next Visit Plan  progress strength and stability    PT Home Exercise Plan  standing hip abduction, standing marches, standing hip extension, standing hamstring curls, mini squats; kitchen sink tandem stance and SLS    Consulted and Agree with Plan of Care  Patient       Patient will benefit from skilled therapeutic intervention in order to improve the following deficits and impairments:  Abnormal gait, Decreased activity tolerance, Decreased balance, Decreased coordination, Decreased endurance, Decreased mobility, Decreased range of motion, Decreased safety awareness, Decreased strength, Difficulty walking, Impaired flexibility, Impaired perceived functional ability, Improper body mechanics, Postural dysfunction, Pain, Impaired UE functional use, Decreased knowledge of precautions, Cardiopulmonary status limiting activity, Decreased knowledge of use of DME  Visit Diagnosis: Muscle weakness (generalized)  Unsteadiness on feet  Neurologic gait disorder     Problem List Patient Active Problem List   Diagnosis Date Noted  . Cognitive deficits   . Labile blood pressure   . Acute blood loss anemia   . Slow transit constipation   . Vascular headache   . 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 [place] ([place] 07/03/2017  . 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 Pelvic fracture ([place] 04/18/2016  . CD (celiac disease) 08/04/2014  . Cancer of upper lobe of right lung ([place] 08/04/2014  . Age related osteoporosis 11/26/2013  . Absolute anemia 05/21/2013  .  H/O malignant neoplasm 05/21/2013  . HLD (hyperlipidemia) 05/21/2013   MJanna Arch PT, DPT   10/22/2018, 10:14 AM  [facility] [address] [address]Phone: [phone]  Fax:  [phone] Name:  Krista Evans MRN: [id number] Date of Birth: 10/28/1945

## 2018-10-24 ENCOUNTER — Other Ambulatory Visit: Payer: Self-pay

## 2018-10-24 ENCOUNTER — Ambulatory Visit: Payer: PPO

## 2018-10-24 DIAGNOSIS — R2681 Unsteadiness on feet: Secondary | ICD-10-CM

## 2018-10-24 DIAGNOSIS — R269 Unspecified abnormalities of gait and mobility: Secondary | ICD-10-CM

## 2018-10-24 DIAGNOSIS — M6281 Muscle weakness (generalized): Secondary | ICD-10-CM

## 2018-10-24 NOTE — Therapy (Signed)
[place] MAIN [facility] SERVICES [address] [place], [place], [address] Phone: [phone]   Fax:  [phone]  Physical Therapy Treatment  Patient Details  Name: Krista Evans MRN: [id number] Date of Birth: 04-14-1945 No data recorded  Encounter Date: 10/24/2018  PT End of Session - 10/24/18 0931    Visit Number  29    Number of Visits  32    Date for PT Re-Evaluation  10/30/18    Authorization Type  9/10 PN start 9/3    PT Start Time  0927    PT Stop Time  1012    PT Time Calculation (min)  45 min    Equipment Utilized During Treatment  Gait belt    Activity Tolerance  Patient tolerated treatment well;No increased pain;Patient limited by fatigue    Behavior During Therapy  [facility] for tasks assessed/performed       Past Medical History:  Diagnosis Date  . Abnormal Q waves on electrocardiogram   . Anemia   . Aortic atherosclerosis ([place])    "I could possibly have it, my grandmother had it"  . Cancer [facility]) 05/2011   Right upper Lung CA with partial Lobectomy.  . Celiac disease   . Celiac disease   . Dyspnea    with exertion  . Hyperlipidemia   . Hyperlipidemia   . Lung mass   . Meningioma ([place])   . Osteoporosis   . Personal history of chemotherapy   . Personal history of radiation therapy     Past Surgical History:  Procedure Laterality Date  . LUNG LOBECTOMY     right lung  . 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  Subjective Assessment - 10/24/18 0930    Subjective  Patient reports compliance with HEP. Was feeling sore after last session but no pain. No falls or LOB since last session.    Pertinent History  Patient is returning to outpatient physical therapy after closure for COVID. Due to patient's last session being in March,  patient is being re-evaluated today. PMH includes anemia, aortic atherosclerosis, cancer, celiac disease, dyspnea, HLD, meningioma, osteoporosis, history of chemo/radiation therapy, ventriculoperitoneal shunt (07/03/17).    Limitations  Lifting;Standing;Walking;House hold activities;Other (comment)    Currently in Pain?  No/denies          Nustep level 3 3 minutes, RPM>60 for cardiovascular and musculoskeletal challenge.  TherEx:  Ambulate in hallway>1500 ftwithnoUE supportone nearLOB when stopping ambulation requiring assistance to maintain stability; no standing rest breaks required for the first time. One seated rest break after 1000 ft Improved gait mechanics and arm swing. ; CGA with cueing for direction and posture.  Scapular retractions 15x  Modified squat with use of bar for support, chair behind x 20; min hands on pelvis for min A for cueing   Standing df/pf 20x  Seated RTB marching, cueing for keeping knees apart. 20x, upright posture, challenge to not lean back.   Standing RTB abduction, one LE at a time, 15x each side.   Standing RTB hip extension one LE at a time 15x each side  Seated GTB abduction 15x    Seated 6TB hamstring curl 15x each LE   seated modified windmills 8x each side  Seated balloon taps reaching inside/outside BOS  2 minutes for coordination and spatial awareness  Neuro Re-ed  airex GUR:KYHCWCBJSE head turns 30 seconds, vertical heads turns 30 seconds  airex padStanding toe taps on 7" step with UE support, reducing to bilateral 2 finger support cueing for control of support limb x20each LE. ; improved control of LLE with foot placement  airex pad: lateral toe taps on 7" step with SUE support, 12x each LE, challenged stabilizing on LLE  Soccer ball LAQ with focus on muscle recruitment timing with coordination and spatial awareness x 2 minutes  Pt educated throughout session about proper posture and technique  with exercises. Improved exercise technique, movement at target joints, use of target muscles after min to mod verbal, visual, tactile cues.                      PT Education - 10/24/18 0931    Education provided  Yes    Education Details  exercise technique, body mechanics    Person(s) Educated  Patient    Methods  Explanation;Demonstration;Tactile cues;Verbal cues    Comprehension  Verbalized understanding;Returned demonstration;Verbal cues required;Tactile cues required       PT Short Term Goals - 09/04/18 0909      PT SHORT TERM GOAL #1   Title  Patient will be independent in HEP demonstrating successful between session carryover and improve functional mobility and ease of completing ADLs.    Baseline  6/30: HEP given next session 7/29: HEP compliant    Time  2    Period  Weeks    Status  Achieved    Target Date  07/30/18      PT SHORT TERM GOAL #2   Title  Patient will be able to transfer sit<>Stand from regular height chair without pushing on arm rests to exhibit improved independence improved functional strength.     Baseline  6/30: requires excessive BUE support 7/20 : able to perform    Time  2    Period  Weeks    Status  Achieved    Target Date  07/30/18        PT Long Term Goals - 09/04/18 0905      PT LONG TERM GOAL #1   Title  Patient will increase six minute walk test distance to >1000 for progression to community ambulator and improve gait ability    Baseline  6/30: 880 ft with rollator 7/29: 882 ft with 3 minutes w/o rollator 8/19: 880 ft without AD    Time  8    Period  Weeks    Status  Partially Met    Target Date  10/30/18      PT LONG TERM GOAL #2   Title  Patient (> 22 years old) will complete five times sit to stand test in < 15 seconds without UE support indicating an increased LE strength and improved balance.    Baseline  6/30: 16 seconds with excessive BUE support 7/29: 15 seconds with SUE support and one LOB 8/19: 14  seconds with SUE support.    Time  8    Period  Weeks    Status  Partially Met    Target Date  10/30/18      PT LONG TERM GOAL #3   Title  Patient will demonstrate an improved Berg Balance Score of >42/56 as to demonstrate improved balance with ADLs such as sitting/standing and transfer balance and reduced fall risk.     Baseline  6/30: 37/56 7/29: 39/56 8/19:  42/56    Time  8    Period  Weeks    Status  Achieved      PT LONG TERM GOAL #4   Title  Patient will improve LEFS to 54/80 to demonstrate improved fucnctional strenth and mobility and ease of indepedence    Baseline  6/30: 32/80 7/29: 39/80 8/19: 41/80    Time  8    Period  Weeks    Status  Partially Met    Target Date  10/30/18      PT LONG TERM GOAL #5   Title  Patient will demonstrate an improved Berg Balance Score of >48/56 as to demonstrate improved balance with ADLs such as sitting/standing and transfer balance and reduced fall risk.    Baseline  8/19: 42/56    Time  8    Period  Weeks    Status  New    Target Date  10/30/18            Plan - 10/24/18 0957    Clinical Impression Statement  Patient presents with excellent motivation to today's session physical therapy session. Patient progressing with duration of ambulation prior to fatigue. Patient is fatigued by weightbearing on LLE. The patient would benefit from further skilled PT to maximize pt functional activities, safety, and QOL    Personal Factors and Comorbidities  Age;Comorbidity 3+;Fitness;Past/Current Experience;Social Background;Time since onset of injury/illness/exacerbation    Examination-Activity Limitations  Bed Mobility;Bathing;Bend;Carry;Dressing;Hygiene/Grooming;Locomotion Level;Reach Overhead;Sit;Squat;Stairs;Stand;Toileting;Transfers    Examination-Participation Restrictions  Church;Cleaning;Community Activity;Driving;Interpersonal Relationship;Meal Prep;Laundry;Shop;Volunteer;Yard Work    [employer] officer   Evolving/Moderate complexity    Rehab Potential  Fair    Clinical Impairments Affecting Rehab Potential  (+) improvement made within last year, eagerness to participate (-) HEP compliance, safety awareness     PT Frequency  2x / week    PT Duration  8 weeks    PT Treatment/Interventions  ADLs/Self Care Home Management;Cryotherapy;Electrical Stimulation;Iontophoresis 65m/ml Dexamethasone;Moist Heat;Ultrasound;DME Instruction;Gait training;Stair training;Balance training;Therapeutic exercise;Therapeutic activities;Functional mobility training;Neuromuscular re-education;Patient/family education;Manual techniques;Passive range of motion;Energy conservation    PT Next Visit Plan  progress strength and stability    PT Home Exercise Plan  standing hip abduction, standing marches, standing hip extension, standing hamstring curls, mini squats; kitchen sink tandem stance and SLS    Consulted and Agree with Plan of Care  Patient       Patient will benefit from skilled therapeutic intervention in order to improve the following deficits and impairments:  Abnormal gait, Decreased activity tolerance, Decreased balance, Decreased coordination, Decreased endurance, Decreased mobility, Decreased range of motion, Decreased safety awareness, Decreased strength, Difficulty walking, Impaired flexibility, Impaired perceived functional ability, Improper body mechanics, Postural dysfunction, Pain, Impaired UE functional use, Decreased knowledge of precautions, Cardiopulmonary status limiting activity, Decreased knowledge of use of DME  Visit Diagnosis: Muscle weakness (generalized)  Unsteadiness on feet  Neurologic gait disorder     Problem List Patient Active Problem List   Diagnosis Date Noted  . Cognitive deficits   . Labile blood pressure   . Acute blood loss anemia   . Slow transit constipation   . Vascular headache   . 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 Hydrocephalus ([place] 07/03/2017  .  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 Pelvic fracture ([place] 04/18/2016  . CD (celiac disease) 08/04/2014  . Cancer of upper lobe of right lung ([place] 08/04/2014  . Age related osteoporosis 11/26/2013  . Absolute anemia 05/21/2013  . H/O malignant neoplasm 05/21/2013  . HLD (hyperlipidemia) 05/21/2013   MJanna Arch PT, DPT  10/24/2018, 10:16 AM  [place] MAIN [facility] SERVICES [address] [place], [place], [address] Phone: [phone]   Fax:  [phone]  Name: BROOKE STEINHILBER MRN: [id number] Date of Birth: 06/30/1945

## 2018-10-29 ENCOUNTER — Ambulatory Visit: Payer: PPO

## 2018-10-29 ENCOUNTER — Other Ambulatory Visit: Payer: Self-pay

## 2018-10-29 DIAGNOSIS — M6281 Muscle weakness (generalized): Secondary | ICD-10-CM

## 2018-10-29 DIAGNOSIS — R2681 Unsteadiness on feet: Secondary | ICD-10-CM

## 2018-10-29 DIAGNOSIS — R269 Unspecified abnormalities of gait and mobility: Secondary | ICD-10-CM

## 2018-10-29 NOTE — Therapy (Signed)
[place] MAIN [facility] SERVICES [address] [place], [place], [address] Phone: [phone]   Fax:  [phone]  Physical Therapy Treatment/ RECERT/ Physical Therapy Progress Note   Dates of reporting period  09/19/18   to   10/29/18  Patient Details  Name: Krista Evans MRN: [id number] Date of Birth: 1945/09/26 No data recorded  Encounter Date: 10/29/2018  PT End of Session - 10/29/18 0953    Visit Number  30    Number of Visits  46    Date for PT Re-Evaluation  12/24/18    Authorization Type  10/10 PN start 9/3; next session 1/10 PN 10/29/18    PT Start Time  0930    PT Stop Time  1014    PT Time Calculation (min)  44 min    Equipment Utilized During Treatment  Gait belt    Activity Tolerance  Patient tolerated treatment well;Patient limited by fatigue    Behavior During Therapy  WFL for tasks assessed/performed       Past Medical History:  Diagnosis Date  . Abnormal Q waves on electrocardiogram   . Anemia   . Aortic atherosclerosis ([place])    "I could possibly have it, my grandmother had it"  . Cancer [facility]) 05/2011   Right upper Lung CA with partial Lobectomy.  . Celiac disease   . Celiac disease   . Dyspnea    with exertion  . Hyperlipidemia   . Hyperlipidemia   . Lung mass   . Meningioma ([place])   . Osteoporosis   . Personal history of chemotherapy   . Personal history of radiation therapy     Past Surgical History:  Procedure Laterality Date  . LUNG LOBECTOMY     right lung  . 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  Subjective Assessment - 10/29/18 0941    Subjective  Patient compliant with HEP, has not been able to walk this weekend due to the rain but has done her inside exercises. No falls or stumbles since last session.     Pertinent History  Patient is returning to outpatient physical therapy after closure for COVID. Due to patient's last session being in March, patient is being re-evaluated today. PMH includes anemia, aortic atherosclerosis, cancer, celiac disease, dyspnea, HLD, meningioma, osteoporosis, history of chemo/radiation therapy, ventriculoperitoneal shunt (07/03/17).    Limitations  Lifting;Standing;Walking;House hold activities;Other (comment)    Currently in Pain?  No/denies         [facility] PT Assessment - 10/29/18 0001      Standardized Balance Assessment   Standardized Balance Assessment  Berg Balance Test;Dynamic Gait Index      Berg Balance Test   Sit to Stand  Able to stand without using hands and stabilize independently    Standing Unsupported  Able to stand safely 2 minutes    Sitting with Back Unsupported but Feet Supported on Floor or Stool  Able to sit safely and securely 2 minutes    Stand to Sit  Sits safely with minimal use of hands    Transfers  Able to transfer safely, minor use of hands    Standing Unsupported with Eyes Closed  Able to stand 10 seconds with supervision    Standing Unsupported with Feet Together  Able to place feet together independently and stand  for 1 minute with supervision    From Standing, Reach Forward with Outstretched Arm  Can reach forward >12 cm safely (5")    From Standing Position, Pick up Object from [place] to pick up shoe safely and easily    From Standing Position, Turn to Look Behind Over each Shoulder  Looks behind from both sides and weight shifts well    Turn 360 Degrees  Able to turn 360 degrees safely one side only in 4 seconds or less    Standing Unsupported, Alternately Place Feet on Step/Stool  Able to complete 4 steps without aid or supervision    Standing Unsupported, One Foot in [place] to take small step independently and hold 30 seconds    Standing on One Leg  Tries to lift leg/unable to hold 3 seconds but remains standing  independently    Total Score  45      Dynamic Gait Index   Level Surface  Mild Impairment    Change in Gait Speed  Moderate Impairment    Gait with Horizontal Head Turns  Mild Impairment    Gait with Vertical Head Turns  Moderate Impairment    Gait and Pivot Turn  Moderate Impairment    Step Over Obstacle  Severe Impairment    Step Around Obstacles  Moderate Impairment    Steps  Moderate Impairment    Total Score  9      Goals:  6 MWT 301 ft no AD, one near LOB 5x STS: 12 seconds SUE support ; without UE support 32 seconds BERG: 45/56  LEFS: 42/80  DGI; 9/24     Patient's condition has the potential to improve in response to therapy. Maximum improvement is yet to be obtained. The anticipated improvement is attainable and reasonable in a generally predictable time.  Patient reports she is more mobile at home and doesn't have to ask for as much help when walking and doing chores.   Patient is progressing towards functional goals at this time. She demonstrates ability to perform 5x STS without UE support for first time. Her BERG additionally has improved allowing for progression to perform DGI as new goal. Patient is challenged by single limb stability at this. Patient's condition has the potential to improve in response to therapy. Maximum improvement is yet to be obtained. The anticipated improvement is attainable and reasonable in a generally predictable time.  The patient would benefit from further skilled PT to maximize pt functional activities, safety, and QOL               PT Education - 10/29/18 0952    Education provided  Yes    Education Details  exercise technique, body mechanics, goals, POC    Person(s) Educated  Patient    Methods  Explanation;Demonstration;Tactile cues;Verbal cues    Comprehension  Verbalized understanding;Returned demonstration;Verbal cues required;Tactile cues required       PT Short Term Goals - 10/29/18 1059      PT SHORT TERM GOAL #1    Title  Patient will be independent in HEP demonstrating successful between session carryover and improve functional mobility and ease of completing ADLs.    Baseline  6/30: HEP given next session 7/29: HEP compliant    Time  2    Period  Weeks    Status  Achieved    Target Date  07/30/18      PT SHORT TERM GOAL #2   Title  Patient will be able  to transfer sit<>Stand from regular height chair without pushing on arm rests to exhibit improved independence improved functional strength.     Baseline  6/30: requires excessive BUE support 7/20 : able to perform    Time  2    Period  Weeks    Status  Achieved    Target Date  07/30/18        PT Long Term Goals - 10/29/18 1059      PT LONG TERM GOAL #1   Title  Patient will increase six minute walk test distance to >1000 for progression to community ambulator and improve gait ability    Baseline  6/30: 880 ft with rollator 7/29: 882 ft with 3 minutes w/o rollator 8/19: 880 ft without AD 10/13: 901 ft with no AD and one near LOB    Time  8    Period  Weeks    Status  Partially Met    Target Date  12/24/18      PT LONG TERM GOAL #2   Title  Patient (> 22 years old) will complete five times sit to stand test in < 15 seconds without UE support indicating an increased LE strength and improved balance.    Baseline  6/30: 16 seconds with excessive BUE support 7/29: 15 seconds with SUE support and one LOB 8/19: 14 seconds with SUE support. 10/13: 12 seconds SUE support, 32 seconds no UE support    Time  8    Period  Weeks    Status  Partially Met    Target Date  12/24/18      PT LONG TERM GOAL #3   Title  Patient will demonstrate an improved Berg Balance Score of >42/56 as to demonstrate improved balance with ADLs such as sitting/standing and transfer balance and reduced fall risk.     Baseline  6/30: 37/56 7/29: 39/56 8/19:  42/56    Time  8    Period  Weeks    Status  Achieved      PT LONG TERM GOAL #4   Title  Patient will improve  LEFS to 54/80 to demonstrate improved fucnctional strenth and mobility and ease of indepedence    Baseline  6/30: 32/80 7/29: 39/80 8/19: 41/80  10/13: 42/80    Time  8    Period  Weeks    Status  Partially Met    Target Date  12/24/18      PT LONG TERM GOAL #5   Title  Patient will demonstrate an improved Berg Balance Score of >48/56 as to demonstrate improved balance with ADLs such as sitting/standing and transfer balance and reduced fall risk.    Baseline  8/19: 42/56 10/13; 45/80    Time  8    Period  Weeks    Status  Partially Met    Target Date  12/24/18      PT LONG TERM GOAL #6   Title  Patient will increase dynamic gait index score to >19/24 as to demonstrate reduced fall risk and improved dynamic gait balance for better safety with community/home ambulation.    Baseline  10/13: 9/24    Time  8    Period  Weeks    Status  New    Target Date  12/24/18            Plan - 10/29/18 1102    Clinical Impression Statement  Patient is progressing towards functional goals at this time. She demonstrates ability to perform 5x  STS without UE support for first time. Her BERG additionally has improved allowing for progression to perform DGI as new goal. Patient is challenged by single limb stability at this. Patient's condition has the potential to improve in response to therapy. Maximum improvement is yet to be obtained. The anticipated improvement is attainable and reasonable in a generally predictable time.  The patient would benefit from further skilled PT to maximize pt functional activities, safety, and QOL    Personal Factors and Comorbidities  Age;Comorbidity 3+;Fitness;Past/Current Experience;Social Background;Time since onset of injury/illness/exacerbation    Examination-Activity Limitations  Bed Mobility;Bathing;Bend;Carry;Dressing;Hygiene/Grooming;Locomotion Level;Reach Overhead;Sit;Squat;Stairs;Stand;Toileting;Transfers    Examination-Participation Restrictions   Church;Cleaning;Community Activity;Driving;Interpersonal Relationship;Meal Prep;Laundry;Shop;Volunteer;Yard Work    [employer]chant [employer] officer  Evolving/Moderate complexity    Rehab Potential  Fair    Clinical Impairments Affecting Rehab Potential  (+) improvement made within last year, eagerness to participate (-) HEP compliance, safety awareness     PT Frequency  2x / week    PT Duration  8 weeks    PT Treatment/Interventions  ADLs/Self Care Home Management;Cryotherapy;Electrical Stimulation;Iontophoresis 83m/ml Dexamethasone;Moist Heat;Ultrasound;DME Instruction;Gait training;Stair training;Balance training;Therapeutic exercise;Therapeutic activities;Functional mobility training;Neuromuscular re-education;Patient/family education;Manual techniques;Passive range of motion;Energy conservation    PT Next Visit Plan  progress strength and stability    PT Home Exercise Plan  standing hip abduction, standing marches, standing hip extension, standing hamstring curls, mini squats; kitchen sink tandem stance and SLS    Consulted and Agree with Plan of Care  Patient       Patient will benefit from skilled therapeutic intervention in order to improve the following deficits and impairments:  Abnormal gait, Decreased activity tolerance, Decreased balance, Decreased coordination, Decreased endurance, Decreased mobility, Decreased range of motion, Decreased safety awareness, Decreased strength, Difficulty walking, Impaired flexibility, Impaired perceived functional ability, Improper body mechanics, Postural dysfunction, Pain, Impaired UE functional use, Decreased knowledge of precautions, Cardiopulmonary status limiting activity, Decreased knowledge of use of DME  Visit Diagnosis: Muscle weakness (generalized)  Unsteadiness on feet  Neurologic gait disorder     Problem List Patient Active Problem List   Diagnosis Date Noted  . Cognitive deficits   . Labile blood pressure   . Acute blood  loss anemia   . Slow transit constipation   . Vascular headache   . 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 Hydrocephalus ([place] 07/03/2017  . 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 Pelvic fracture ([place] 04/18/2016  . CD (celiac disease) 08/04/2014  . Cancer of upper lobe of right lung ([place] 08/04/2014  . Age related osteoporosis 11/26/2013  . Absolute anemia 05/21/2013  . H/O malignant neoplasm 05/21/2013  . HLD (hyperlipidemia) 05/21/2013   MJanna Arch PT, DPT   10/29/2018, 11:03 AM  [place] [facility] [address] [address]Phone: [phone]  Fax:  [phone] Name: MYALONDA SAMPLEMRN: [id number] of Birth: [birth year]

## 2018-10-31 ENCOUNTER — Other Ambulatory Visit: Payer: Self-pay

## 2018-10-31 ENCOUNTER — Ambulatory Visit: Payer: PPO

## 2018-10-31 DIAGNOSIS — R269 Unspecified abnormalities of gait and mobility: Secondary | ICD-10-CM

## 2018-10-31 DIAGNOSIS — M6281 Muscle weakness (generalized): Secondary | ICD-10-CM | POA: Diagnosis not present

## 2018-10-31 DIAGNOSIS — R2681 Unsteadiness on feet: Secondary | ICD-10-CM

## 2018-10-31 NOTE — Therapy (Signed)
[place] MAIN [facility] SERVICES [address] [place], [place], [address] Phone: [phone]   Fax:  [phone]  Physical Therapy Treatment  Patient Details  Name: Krista Evans MRN: [id number] Date of Birth: [birth year] No data recorded  Encounter Date: 10/31/2018  PT End of Session - 10/31/18 0936    Visit Number  31    Number of Visits  46    Date for PT Re-Evaluation  12/24/18    Authorization Type  1/10 PN 10/29/18    PT Start Time  0930    PT Stop Time  1014    PT Time Calculation (min)  44 min    Equipment Utilized During Treatment  Gait belt    Activity Tolerance  Patient tolerated treatment well;Patient limited by fatigue    Behavior During Therapy  [facility] for tasks assessed/performed       Past Medical History:  Diagnosis Date  . Abnormal Q waves on electrocardiogram   . Anemia   . Aortic atherosclerosis ([place])    "I could possibly have it, my grandmother had it"  . Cancer [facility]) 05/2011   Right upper Lung CA with partial Lobectomy.  . Celiac disease   . Celiac disease   . Dyspnea    with exertion  . Hyperlipidemia   . Hyperlipidemia   . Lung mass   . Meningioma ([place])   . Osteoporosis   . Personal history of chemotherapy   . Personal history of radiation therapy     Past Surgical History:  Procedure Laterality Date  . LUNG LOBECTOMY     right lung  . 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  Subjective Assessment - 10/31/18 0935    Subjective  Patient reports being sore from 6 minute walk test last session. Still fatigued from previous session. No falls or LOB since last session.    Pertinent History  Patient is returning to outpatient physical therapy after closure for COVID. Due to patient's last session being in March,  patient is being re-evaluated today. PMH includes anemia, aortic atherosclerosis, cancer, celiac disease, dyspnea, HLD, meningioma, osteoporosis, history of chemo/radiation therapy, ventriculoperitoneal shunt (07/03/17).    Limitations  Lifting;Standing;Walking;House hold activities;Other (comment)    Currently in Pain?  No/denies            Nustep level 3 3 minutes, RPM>60 for cardiovascular and musculoskeletal challenge.    TherEx:     Scapular retractions 15x   Seated GTB marching, cueing for keeping knees apart. 20x, upright posture, challenge to not lean back.      Seated GTB abduction 15x    Seated GTB hamstring curl 15x each LE   Quantum leg press: bilateral LE #120; 10x each LE, 2 sets, rainbow ball between knees for alignment and proper muscle recruitment.  Very challenging last few repetitions    Quantum leg press, single LE; #60 dropped to #45 due to fatigue, 15x each LE, 2 sets. Cueing for neutral alignment and muscle recruitment.    Neuro Re-ed   airex pad: horizontal head turns 30 seconds, vertical heads turns 30 seconds  airex pad, 7" step holding foot on each surface, 30 seconds holds each LE, 2 sets each LE    airex pad Standing toe taps on 7" step with sticky note for cue of placement  of LLE, very challenging for patient to perform with SUE support requiring max support/cueing.    Soccer ball LAQ with focus on muscle recruitment timing with coordination and spatial awareness x 2 minutes    Pt educated throughout session about proper posture and technique with exercises. Improved exercise technique, movement at target joints, use of target muscles after min to mod verbal, visual, tactile cues.                     PT Education - 10/31/18 0936    Education provided  Yes    Education Details  exercise technique, body mechanics,    Person(s) Educated  Patient    Methods  Explanation;Tactile cues;Demonstration;Verbal cues    Comprehension   Verbalized understanding;Returned demonstration;Verbal cues required;Tactile cues required       PT Short Term Goals - 10/29/18 1059      PT SHORT TERM GOAL #1   Title  Patient will be independent in HEP demonstrating successful between session carryover and improve functional mobility and ease of completing ADLs.    Baseline  6/30: HEP given next session 7/29: HEP compliant    Time  2    Period  Weeks    Status  Achieved    Target Date  07/30/18      PT SHORT TERM GOAL #2   Title  Patient will be able to transfer sit<>Stand from regular height chair without pushing on arm rests to exhibit improved independence improved functional strength.     Baseline  6/30: requires excessive BUE support 7/20 : able to perform    Time  2    Period  Weeks    Status  Achieved    Target Date  07/30/18        PT Long Term Goals - 10/29/18 1059      PT LONG TERM GOAL #1   Title  Patient will increase six minute walk test distance to >1000 for progression to community ambulator and improve gait ability    Baseline  6/30: 880 ft with rollator 7/29: 882 ft with 3 minutes w/o rollator 8/19: 880 ft without AD 10/13: 901 ft with no AD and one near LOB    Time  8    Period  Weeks    Status  Partially Met    Target Date  12/24/18      PT LONG TERM GOAL #2   Title  Patient (> 75 years old) will complete five times sit to stand test in < 15 seconds without UE support indicating an increased LE strength and improved balance.    Baseline  6/30: 16 seconds with excessive BUE support 7/29: 15 seconds with SUE support and one LOB 8/19: 14 seconds with SUE support. 10/13: 12 seconds SUE support, 32 seconds no UE support    Time  8    Period  Weeks    Status  Partially Met    Target Date  12/24/18      PT LONG TERM GOAL #3   Title  Patient will demonstrate an improved Berg Balance Score of >42/56 as to demonstrate improved balance with ADLs such as sitting/standing and transfer balance and reduced fall risk.      Baseline  6/30: 37/56 7/29: 39/56 8/19:  42/56    Time  8    Period  Weeks    Status  Achieved      PT LONG TERM GOAL #4   Title  Patient will  improve LEFS to 54/80 to demonstrate improved fucnctional strenth and mobility and ease of indepedence    Baseline  6/30: 32/80 7/29: 39/80 8/19: 41/80  10/13: 42/80    Time  8    Period  Weeks    Status  Partially Met    Target Date  12/24/18      PT LONG TERM GOAL #5   Title  Patient will demonstrate an improved Berg Balance Score of >48/56 as to demonstrate improved balance with ADLs such as sitting/standing and transfer balance and reduced fall risk.    Baseline  8/19: 42/56 10/13; 45/80    Time  8    Period  Weeks    Status  Partially Met    Target Date  12/24/18      PT LONG TERM GOAL #6   Title  Patient will increase dynamic gait index score to >19/24 as to demonstrate reduced fall risk and improved dynamic gait balance for better safety with community/home ambulation.    Baseline  10/13: 9/24    Time  8    Period  Weeks    Status  New    Target Date  12/24/18            Plan - 10/31/18 0955    Clinical Impression Statement  Patient is very fearful of single upper extremity support with unstable surfaces and single leg support such as when performing toe taps with LLE. Patient fatigued quickly with interventions require more frequent rest breaks this session. The patient would benefit from further skilled PT to maximize pt functional activities, safety, and QOL    Personal Factors and Comorbidities  Age;Comorbidity 3+;Fitness;Past/Current Experience;Social Background;Time since onset of injury/illness/exacerbation    Examination-Activity Limitations  Bed Mobility;Bathing;Bend;Carry;Dressing;Hygiene/Grooming;Locomotion Level;Reach Overhead;Sit;Squat;Stairs;Stand;Toileting;Transfers    Examination-Participation Restrictions  Church;Cleaning;Community Activity;Driving;Interpersonal Relationship;Meal  Prep;Laundry;Shop;Volunteer;Yard Work    [employer]  Evolving/Moderate complexity    Rehab Potential  Fair    Clinical Impairments Affecting Rehab Potential  (+) improvement made within last year, eagerness to participate (-) HEP compliance, safety awareness     PT Frequency  2x / week    PT Duration  8 weeks    PT Treatment/Interventions  ADLs/Self Care Home Management;Cryotherapy;Electrical Stimulation;Iontophoresis 63m/ml Dexamethasone;Moist Heat;Ultrasound;DME Instruction;Gait training;Stair training;Balance training;Therapeutic exercise;Therapeutic activities;Functional mobility training;Neuromuscular re-education;Patient/family education;Manual techniques;Passive range of motion;Energy conservation    PT Next Visit Plan  progress strength and stability    PT Home Exercise Plan  standing hip abduction, standing marches, standing hip extension, standing hamstring curls, mini squats; kitchen sink tandem stance and SLS    Consulted and Agree with Plan of Care  Patient       Patient will benefit from skilled therapeutic intervention in order to improve the following deficits and impairments:  Abnormal gait, Decreased activity tolerance, Decreased balance, Decreased coordination, Decreased endurance, Decreased mobility, Decreased range of motion, Decreased safety awareness, Decreased strength, Difficulty walking, Impaired flexibility, Impaired perceived functional ability, Improper body mechanics, Postural dysfunction, Pain, Impaired UE functional use, Decreased knowledge of precautions, Cardiopulmonary status limiting activity, Decreased knowledge of use of DME  Visit Diagnosis: Muscle weakness (generalized)  Unsteadiness on feet  Neurologic gait disorder     Problem List Patient Active Problem List   Diagnosis Date Noted  . Cognitive deficits   . Labile blood pressure   . Acute blood loss anemia   . Slow transit constipation   . Vascular headache   .  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 [place]cephalus ([place] 07/03/2017  .  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 Pelvic fracture ([place]) 04/18/2016  . CD (celiac disease) 08/04/2014  . Cancer of upper lobe of right lung ([place]) 08/04/2014  . Age related osteoporosis 11/26/2013  . Absolute anemia 05/21/2013  . H/O malignant neoplasm 05/21/2013  . HLD (hyperlipidemia) 05/21/2013   Janna Arch, PT, DPT   10/31/2018, 10:14 AM  [place] MAIN [facility] SERVICES [address] [place], [place], [address] Phone: [phone]   Fax:  [phone]  Name: TARAN HABLE MRN: [id number] Date of Birth: 1945/05/19

## 2018-11-05 ENCOUNTER — Other Ambulatory Visit: Payer: Self-pay

## 2018-11-05 ENCOUNTER — Ambulatory Visit: Payer: PPO

## 2018-11-05 DIAGNOSIS — M6281 Muscle weakness (generalized): Secondary | ICD-10-CM

## 2018-11-05 DIAGNOSIS — R2681 Unsteadiness on feet: Secondary | ICD-10-CM

## 2018-11-05 DIAGNOSIS — R269 Unspecified abnormalities of gait and mobility: Secondary | ICD-10-CM

## 2018-11-05 NOTE — Therapy (Signed)
[place] MAIN [facility] SERVICES [address] [place], [place], [address] Phone: [phone]   Fax:  [phone]  Physical Therapy Treatment  Patient Details  Name: Krista Evans MRN: [id number] Date of Birth: 09/03/1945 No data recorded  Encounter Date: 11/05/2018  PT End of Session - 11/05/18 0935    Visit Number  32    Number of Visits  46    Date for PT Re-Evaluation  12/24/18    Authorization Type  2/10 PN 10/29/18    PT Start Time  0930    PT Stop Time  1013    PT Time Calculation (min)  43 min    Equipment Utilized During Treatment  Gait belt    Activity Tolerance  Patient tolerated treatment well;Patient limited by fatigue    Behavior During Therapy  [facility] for tasks assessed/performed       Past Medical History:  Diagnosis Date  . Abnormal Q waves on electrocardiogram   . Anemia   . Aortic atherosclerosis ([place])    "I could possibly have it, my grandmother had it"  . Cancer [facility]) 05/2011   Right upper Lung CA with partial Lobectomy.  . Celiac disease   . Celiac disease   . Dyspnea    with exertion  . Hyperlipidemia   . Hyperlipidemia   . Lung mass   . Meningioma ([place])   . Osteoporosis   . Personal history of chemotherapy   . Personal history of radiation therapy     Past Surgical History:  Procedure Laterality Date  . LUNG LOBECTOMY     right lung  . 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  Subjective Assessment - 11/05/18 0934    Subjective  Patients reports she has been doing her HEP 2x/day at home. No falls but did trip on a walk since last session.    Pertinent History  Patient is returning to outpatient physical therapy after closure for COVID. Due to patient's last session being in March, patient is being  re-evaluated today. PMH includes anemia, aortic atherosclerosis, cancer, celiac disease, dyspnea, HLD, meningioma, osteoporosis, history of chemo/radiation therapy, ventriculoperitoneal shunt (07/03/17).    Limitations  Lifting;Standing;Walking;House hold activities;Other (comment)    Currently in Pain?  No/denies                Nustep level 3 3 minutes, RPM>60 for cardiovascular and musculoskeletal challenge.    TherEx:     Ambulate in hallway>1100 ftwithnoUE supportone nearLOB when stopping ambulation requiring assistance to maintain stability; no standing rest breaks requiredImproved gait mechanics and arm swing. ; CGA with cueing for direction and posture.  Scapular retractions 15x   Seated GTB marching, cueing for keeping knees apart. 20x, upright posture, challenge to not lean back. ; 2 sets   Seated GTB abduction 15x ; 2 sets    Seated GTB hamstring curl 15x each LE    Seated heel toe raises 20x   Seated ER/IR 10x each side   Neuro Re-ed Ambulate around gym scanning room for cones with horizontal and vertical scanning for improved stability for carryover to grocery store, CGA ; very challenging, one LOB requiring min A to return COM.    airex pad, 7" step holding foot on each surface, 30 seconds holds each LE, 2 sets each LE  airex pad Standing toe taps on 7" step with sticky note for cue of placement of LLE, very challenging for patient to perform with SUE support requiring max support/cueing.    Soccer ball LAQ with focus on muscle recruitment timing with coordination and spatial awareness x 2 minutes    Pt educated throughout session about proper posture and technique with exercises. Improved exercise technique, movement at target joints, use of target muscles after min to mod verbal, visual, tactile cues.                   PT Education - 11/05/18 0935    Education provided  Yes    Education Details  exercise technique, body mechanics     Person(s) Educated  Patient    Methods  Explanation;Demonstration;Tactile cues;Verbal cues    Comprehension  Verbalized understanding;Returned demonstration;Verbal cues required;Tactile cues required       PT Short Term Goals - 10/29/18 1059      PT SHORT TERM GOAL #1   Title  Patient will be independent in HEP demonstrating successful between session carryover and improve functional mobility and ease of completing ADLs.    Baseline  6/30: HEP given next session 7/29: HEP compliant    Time  2    Period  Weeks    Status  Achieved    Target Date  07/30/18      PT SHORT TERM GOAL #2   Title  Patient will be able to transfer sit<>Stand from regular height chair without pushing on arm rests to exhibit improved independence improved functional strength.     Baseline  6/30: requires excessive BUE support 7/20 : able to perform    Time  2    Period  Weeks    Status  Achieved    Target Date  07/30/18        PT Long Term Goals - 10/29/18 1059      PT LONG TERM GOAL #1   Title  Patient will increase six minute walk test distance to >1000 for progression to community ambulator and improve gait ability    Baseline  6/30: 880 ft with rollator 7/29: 882 ft with 3 minutes w/o rollator 8/19: 880 ft without AD 10/13: 901 ft with no AD and one near LOB    Time  8    Period  Weeks    Status  Partially Met    Target Date  12/24/18      PT LONG TERM GOAL #2   Title  Patient (> 60 years old) will complete five times sit to stand test in < 15 seconds without UE support indicating an increased LE strength and improved balance.    Baseline  6/30: 16 seconds with excessive BUE support 7/29: 15 seconds with SUE support and one LOB 8/19: 14 seconds with SUE support. 10/13: 12 seconds SUE support, 32 seconds no UE support    Time  8    Period  Weeks    Status  Partially Met    Target Date  12/24/18      PT LONG TERM GOAL #3   Title  Patient will demonstrate an improved Berg Balance Score of >42/56  as to demonstrate improved balance with ADLs such as sitting/standing and transfer balance and reduced fall risk.     Baseline  6/30: 37/56 7/29: 39/56 8/19:  42/56    Time  8    Period  Weeks    Status  Achieved  PT LONG TERM GOAL #4   Title  Patient will improve LEFS to 54/80 to demonstrate improved fucnctional strenth and mobility and ease of indepedence    Baseline  6/30: 32/80 7/29: 39/80 8/19: 41/80  10/13: 42/80    Time  8    Period  Weeks    Status  Partially Met    Target Date  12/24/18      PT LONG TERM GOAL #5   Title  Patient will demonstrate an improved Berg Balance Score of >48/56 as to demonstrate improved balance with ADLs such as sitting/standing and transfer balance and reduced fall risk.    Baseline  8/19: 42/56 10/13; 45/80    Time  8    Period  Weeks    Status  Partially Met    Target Date  12/24/18      PT LONG TERM GOAL #6   Title  Patient will increase dynamic gait index score to >19/24 as to demonstrate reduced fall risk and improved dynamic gait balance for better safety with community/home ambulation.    Baseline  10/13: 9/24    Time  8    Period  Weeks    Status  New    Target Date  12/24/18            Plan - 11/05/18 0954    Clinical Impression Statement  Patient is improving with stability on unstable surface however continues to require UE support with dynamic mobility on unstable surface. She continues to be fearful of LOB however is motivated and able to participate in tasks that are "intimidatign" to her with encouragement. The patient would benefit from further skilled PT to maximize pt functional activities, safety, and QOL    Personal Factors and Comorbidities  Age;Comorbidity 3+;Fitness;Past/Current Experience;Social Background;Time since onset of injury/illness/exacerbation    Examination-Activity Limitations  Bed Mobility;Bathing;Bend;Carry;Dressing;Hygiene/Grooming;Locomotion Level;Reach  Overhead;Sit;Squat;Stairs;Stand;Toileting;Transfers    Examination-Participation Restrictions  Church;Cleaning;Community Activity;Driving;Interpersonal Relationship;Meal Prep;Laundry;Shop;Volunteer;Yard Work    [employer] officer  Evolving/Moderate complexity    Rehab Potential  Fair    Clinical Impairments Affecting Rehab Potential  (+) improvement made within last year, eagerness to participate (-) HEP compliance, safety awareness     PT Frequency  2x / week    PT Duration  8 weeks    PT Treatment/Interventions  ADLs/Self Care Home Management;Cryotherapy;Electrical Stimulation;Iontophoresis 45m/ml Dexamethasone;Moist Heat;Ultrasound;DME Instruction;Gait training;Stair training;Balance training;Therapeutic exercise;Therapeutic activities;Functional mobility training;Neuromuscular re-education;Patient/family education;Manual techniques;Passive range of motion;Energy conservation    PT Next Visit Plan  progress strength and stability    PT Home Exercise Plan  standing hip abduction, standing marches, standing hip extension, standing hamstring curls, mini squats; kitchen sink tandem stance and SLS    Consulted and Agree with Plan of Care  Patient       Patient will benefit from skilled therapeutic intervention in order to improve the following deficits and impairments:  Abnormal gait, Decreased activity tolerance, Decreased balance, Decreased coordination, Decreased endurance, Decreased mobility, Decreased range of motion, Decreased safety awareness, Decreased strength, Difficulty walking, Impaired flexibility, Impaired perceived functional ability, Improper body mechanics, Postural dysfunction, Pain, Impaired UE functional use, Decreased knowledge of precautions, Cardiopulmonary status limiting activity, Decreased knowledge of use of DME  Visit Diagnosis: Muscle weakness (generalized)  Unsteadiness on feet  Neurologic gait disorder     Problem List Patient Active Problem  List   Diagnosis Date Noted  . Cognitive deficits   . Labile blood pressure   . Acute blood loss anemia   . Slow transit constipation   .  Vascular headache   . 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 Hydrocephalus ([place]) 07/03/2017  . 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 Pelvic fracture ([place]) 04/18/2016  . CD (celiac disease) 08/04/2014  . Cancer of upper lobe of right lung ([place]) 08/04/2014  . Age related osteoporosis 11/26/2013  . Absolute anemia 05/21/2013  . H/O malignant neoplasm 05/21/2013  . HLD (hyperlipidemia) 05/21/2013   Janna Arch, PT, DPT   11/05/2018, 10:13 AM  [place] MAIN [facility] SERVICES [address] [place], [place], [address] Phone: [phone]   Fax:  [phone]  Name: CHEREESE CILENTO MRN: [id number] Date of Birth: 01-26-1945

## 2018-11-07 ENCOUNTER — Ambulatory Visit: Payer: PPO

## 2018-11-07 ENCOUNTER — Other Ambulatory Visit: Payer: Self-pay

## 2018-11-07 DIAGNOSIS — R269 Unspecified abnormalities of gait and mobility: Secondary | ICD-10-CM

## 2018-11-07 DIAGNOSIS — M6281 Muscle weakness (generalized): Secondary | ICD-10-CM

## 2018-11-07 DIAGNOSIS — R2681 Unsteadiness on feet: Secondary | ICD-10-CM

## 2018-11-07 NOTE — Therapy (Signed)
[place] MAIN [facility] SERVICES [address] [place], [place], [address] Phone: [phone]   Fax:  [phone]  Physical Therapy Treatment  Patient Details  Name: Krista Evans MRN: [id number] Date of Birth: 08/11/1945 No data recorded  Encounter Date: 11/07/2018  PT End of Session - 11/07/18 0932    Visit Number  33    Number of Visits  46    Date for PT Re-Evaluation  12/24/18    Authorization Type  3/10 PN 10/29/18    PT Start Time  0929    PT Stop Time  1014    PT Time Calculation (min)  45 min    Equipment Utilized During Treatment  Gait belt    Activity Tolerance  Patient tolerated treatment well;Patient limited by fatigue    Behavior During Therapy  [facility] for tasks assessed/performed       Past Medical History:  Diagnosis Date  . Abnormal Q waves on electrocardiogram   . Anemia   . Aortic atherosclerosis ([place])    "I could possibly have it, my grandmother had it"  . Cancer [facility]) 05/2011   Right upper Lung CA with partial Lobectomy.  . Celiac disease   . Celiac disease   . Dyspnea    with exertion  . Hyperlipidemia   . Hyperlipidemia   . Lung mass   . Meningioma ([place])   . Osteoporosis   . Personal history of chemotherapy   . Personal history of radiation therapy     Past Surgical History:  Procedure Laterality Date  . LUNG LOBECTOMY     right lung  . 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  Subjective Assessment - 11/07/18 0931    Subjective  Patient reports compliance with HEP. No falls or LOB since last session.    Pertinent History  Patient is returning to outpatient physical therapy after closure for COVID. Due to patient's last session being in March, patient is being re-evaluated today. PMH includes anemia, aortic  atherosclerosis, cancer, celiac disease, dyspnea, HLD, meningioma, osteoporosis, history of chemo/radiation therapy, ventriculoperitoneal shunt (07/03/17).    Limitations  Lifting;Standing;Walking;House hold activities;Other (comment)    Currently in Pain?  No/denies              Nustep level 3 3 minutes, RPM>60 for cardiovascular and musculoskeletal challenge.    TherEx:     Quantum leg press: bilateral LE #120; 15x each LE, 2 sets, rainbow ball between knees for alignment and proper muscle recruitment. Cueing to exhale with pressing of legs.     Quantum leg press, single LE; #60  15x each LE, 2 sets. Cueing for neutral alignment and muscle recruitment.   Seated LAQ squeezing ball between feet for adduction 10x    Soccer ball: LAQ kicks for coordination and spatial awareness, focus on functional timing of LLE  Seated modified [place] 5x each side, 2 trials   Scapular retractions 15x  Weighted bar: 3.5 lb bar -row 10x cueing for upright posture and body mechanics -single arm row 10x each side , challenging for unilateral task.  -overhead rises 12x    Seated GTB marching, cueing for keeping knees apart. 20x, upright posture, challenge to not lean back. ; 2 sets   Seated GTB abduction 15x ; 2 sets    Seated heel toe  raises 20x    Seated ER/IR 10x each side     Pt educated throughout session about proper posture and technique with exercises. Improved exercise technique, movement at target joints, use of target muscles after min to mod verbal, visual, tactile cues.                    PT Education - 11/07/18 0932    Education provided  Yes    Education Details  exercise technique, body mechanics    Person(s) Educated  Patient    Methods  Explanation;Demonstration;Tactile cues;Verbal cues    Comprehension  Verbalized understanding;Returned demonstration;Verbal cues required;Tactile cues required       PT Short Term Goals - 10/29/18 1059      PT  SHORT TERM GOAL #1   Title  Patient will be independent in HEP demonstrating successful between session carryover and improve functional mobility and ease of completing ADLs.    Baseline  6/30: HEP given next session 7/29: HEP compliant    Time  2    Period  Weeks    Status  Achieved    Target Date  07/30/18      PT SHORT TERM GOAL #2   Title  Patient will be able to transfer sit<>Stand from regular height chair without pushing on arm rests to exhibit improved independence improved functional strength.     Baseline  6/30: requires excessive BUE support 7/20 : able to perform    Time  2    Period  Weeks    Status  Achieved    Target Date  07/30/18        PT Long Term Goals - 10/29/18 1059      PT LONG TERM GOAL #1   Title  Patient will increase six minute walk test distance to >1000 for progression to community ambulator and improve gait ability    Baseline  6/30: 880 ft with rollator 7/29: 882 ft with 3 minutes w/o rollator 8/19: 880 ft without AD 10/13: 901 ft with no AD and one near LOB    Time  8    Period  Weeks    Status  Partially Met    Target Date  12/24/18      PT LONG TERM GOAL #2   Title  Patient (> 1 years old) will complete five times sit to stand test in < 15 seconds without UE support indicating an increased LE strength and improved balance.    Baseline  6/30: 16 seconds with excessive BUE support 7/29: 15 seconds with SUE support and one LOB 8/19: 14 seconds with SUE support. 10/13: 12 seconds SUE support, 32 seconds no UE support    Time  8    Period  Weeks    Status  Partially Met    Target Date  12/24/18      PT LONG TERM GOAL #3   Title  Patient will demonstrate an improved Berg Balance Score of >42/56 as to demonstrate improved balance with ADLs such as sitting/standing and transfer balance and reduced fall risk.     Baseline  6/30: 37/56 7/29: 39/56 8/19:  42/56    Time  8    Period  Weeks    Status  Achieved      PT LONG TERM GOAL #4   Title   Patient will improve LEFS to 54/80 to demonstrate improved fucnctional strenth and mobility and ease of indepedence    Baseline  6/30: 32/80 7/29: 39/80  8/19: 41/80  10/13: 42/80    Time  8    Period  Weeks    Status  Partially Met    Target Date  12/24/18      PT LONG TERM GOAL #5   Title  Patient will demonstrate an improved Berg Balance Score of >48/56 as to demonstrate improved balance with ADLs such as sitting/standing and transfer balance and reduced fall risk.    Baseline  8/19: 42/56 10/13; 45/80    Time  8    Period  Weeks    Status  Partially Met    Target Date  12/24/18      PT LONG TERM GOAL #6   Title  Patient will increase dynamic gait index score to >19/24 as to demonstrate reduced fall risk and improved dynamic gait balance for better safety with community/home ambulation.    Baseline  10/13: 9/24    Time  8    Period  Weeks    Status  New    Target Date  12/24/18            Plan - 11/07/18 0947    Clinical Impression Statement  Patient progressing with functional strength with increased ability to perform repetitions in a set with higher weights. Use of cueing for body mechanics continues to be required. Patient requires cueing of breathing techniques for maximal muscle contraction. The patient would benefit from further skilled PT to maximize pt functional activities, safety, and QOL    Personal Factors and Comorbidities  Age;Comorbidity 3+;Fitness;Past/Current Experience;Social Background;Time since onset of injury/illness/exacerbation    Examination-Activity Limitations  Bed Mobility;Bathing;Bend;Carry;Dressing;Hygiene/Grooming;Locomotion Level;Reach Overhead;Sit;Squat;Stairs;Stand;Toileting;Transfers    Examination-Participation Restrictions  Church;Cleaning;Community Activity;Driving;Interpersonal Relationship;Meal Prep;Laundry;Shop;Volunteer;Yard Work    [employer]  Evolving/Moderate complexity    Rehab Potential  Fair    Clinical  Impairments Affecting Rehab Potential  (+) improvement made within last year, eagerness to participate (-) HEP compliance, safety awareness     PT Frequency  2x / week    PT Duration  8 weeks    PT Treatment/Interventions  ADLs/Self Care Home Management;Cryotherapy;Electrical Stimulation;Iontophoresis 65m/ml Dexamethasone;Moist Heat;Ultrasound;DME Instruction;Gait training;Stair training;Balance training;Therapeutic exercise;Therapeutic activities;Functional mobility training;Neuromuscular re-education;Patient/family education;Manual techniques;Passive range of motion;Energy conservation    PT Next Visit Plan  progress strength and stability    PT Home Exercise Plan  standing hip abduction, standing marches, standing hip extension, standing hamstring curls, mini squats; kitchen sink tandem stance and SLS    Consulted and Agree with Plan of Care  Patient       Patient will benefit from skilled therapeutic intervention in order to improve the following deficits and impairments:  Abnormal gait, Decreased activity tolerance, Decreased balance, Decreased coordination, Decreased endurance, Decreased mobility, Decreased range of motion, Decreased safety awareness, Decreased strength, Difficulty walking, Impaired flexibility, Impaired perceived functional ability, Improper body mechanics, Postural dysfunction, Pain, Impaired UE functional use, Decreased knowledge of precautions, Cardiopulmonary status limiting activity, Decreased knowledge of use of DME  Visit Diagnosis: Muscle weakness (generalized)  Unsteadiness on feet  Neurologic gait disorder     Problem List Patient Active Problem List   Diagnosis Date Noted  . Cognitive deficits   . Labile blood pressure   . Acute blood loss anemia   . Slow transit constipation   . Vascular headache   . 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 [place]cephalus ([place] 07/03/2017  . 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 Pelvic fracture ([place]  04/18/2016  . CD (celiac disease) 08/04/2014  . Cancer of upper lobe  of right lung ([place]) 08/04/2014  . Age related osteoporosis 11/26/2013  . Absolute anemia 05/21/2013  . H/O malignant neoplasm 05/21/2013  . HLD (hyperlipidemia) 05/21/2013   Janna Arch, PT, DPT   11/07/2018, 10:16 AM  [place] MAIN [facility] SERVICES [address] [place], [place], [address] Phone: [phone]   Fax:  [phone]  Name: Krista Evans MRN: [id number] Date of Birth: [birth year]

## 2018-11-12 ENCOUNTER — Other Ambulatory Visit: Payer: Self-pay

## 2018-11-12 ENCOUNTER — Ambulatory Visit: Payer: PPO

## 2018-11-12 DIAGNOSIS — R269 Unspecified abnormalities of gait and mobility: Secondary | ICD-10-CM

## 2018-11-12 DIAGNOSIS — R2681 Unsteadiness on feet: Secondary | ICD-10-CM

## 2018-11-12 DIAGNOSIS — M6281 Muscle weakness (generalized): Secondary | ICD-10-CM | POA: Diagnosis not present

## 2018-11-12 NOTE — Therapy (Signed)
[place] MAIN [facility] SERVICES [address] [place], [place], [address] Phone: [phone]   Fax:  [phone]  Physical Therapy Treatment  Patient Details  Name: Krista Evans MRN: [id number] Date of Birth: [birth year] No data recorded  Encounter Date: 11/12/2018  PT End of Session - 11/12/18 0937    Visit Number  34    Number of Visits  46    Date for PT Re-Evaluation  12/24/18    PT Start Time  0931    PT Stop Time  1015    PT Time Calculation (min)  44 min    Equipment Utilized During Treatment  Gait belt    Activity Tolerance  Patient tolerated treatment well;Patient limited by fatigue    Behavior During Therapy  [facility] for tasks assessed/performed       Past Medical History:  Diagnosis Date  . Abnormal Q waves on electrocardiogram   . Anemia   . Aortic atherosclerosis ([place])    "I could possibly have it, my grandmother had it"  . Cancer [facility]) 05/2011   Right upper Lung CA with partial Lobectomy.  . Celiac disease   . Celiac disease   . Dyspnea    with exertion  . Hyperlipidemia   . Hyperlipidemia   . Lung mass   . Meningioma ([place])   . Osteoporosis   . Personal history of chemotherapy   . Personal history of radiation therapy     Past Surgical History:  Procedure Laterality Date  . LUNG LOBECTOMY     right lung  . 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  Subjective Assessment - 11/12/18 0934    Subjective  Patient without complaints this AM, no falls, stumbles or pain to report. Compliant with HEP daily.    Pertinent History  Patient is returning to outpatient physical therapy after closure for COVID. Due to patient's last session being in March, patient is being re-evaluated today. PMH includes anemia, aortic atherosclerosis,  cancer, celiac disease, dyspnea, HLD, meningioma, osteoporosis, history of chemo/radiation therapy, ventriculoperitoneal shunt (07/03/17).    Limitations  Lifting;Standing;Walking;House hold activities;Other (comment)    How long can you sit comfortably?  n/a    How long can you stand comfortably?  5 minutes    How long can you walk comfortably?  400 ft before fatigue/out of breathe.    Patient Stated Goals  Patient wants to be able to step up onto a curb without holding on    Currently in Pain?  No/denies        Nustep level 3 4 minutes, RPM>60 for cardiovascular and musculoskeletal challenge.    TherEx:      Seated LAQ squeezing ball between feet for adduction 10x    Seated GTB abduction 15x ; 2 sets     NMR : Pt needed CGA throughout, and maximum encouragement  (seated) Soccer ball: LAQ kicks for coordination and spatial awareness, focus on functional timing of LLE  airex pad, 7" step holding foot on each surface, 30 seconds holds each LE, 3 sets each LE, no UE support  airex padStanding toe taps on7" step, intermittent cues for SUE, very challenging for patient to perform with SUE   Lateral step over hurdles, BUE, pt unable to decrease UE support. Focus on foot placement, step length, cues to  avoid shuffled steps and confident foot placement. x12  Sticky note taps: forward step alternating, weight shift to anterior foot without lifting posterior foot; x7 with BUE support, x8 with SUE, cues to avoid shuffled steps, proper weight shift, step length    Pt educated throughout session about proper posture and technique with exercises. Improved exercise technique, movement at target joints, use of target muscles after min to mod verbal, visual, tactile cues.    Pt response/clinical impression: Patient remains motivated to participate with therapy, though has noticeable hesitation/fear when attempting to improve step length/placement. Pt also requires at least unilateral support for  more dynamic activities, difficult to assess true need of physical assistance versus fear response. Overall the patient would continue to benefit from further skilled PT intervention to continue to progress towards goals.     PT Education - 11/12/18 0935    Education provided  Yes    Education Details  exercise technique    Person(s) Educated  Patient    Methods  Explanation;Demonstration;Tactile cues;Verbal cues    Comprehension  Verbalized understanding;Returned demonstration;Verbal cues required;Tactile cues required       PT Short Term Goals - 10/29/18 1059      PT SHORT TERM GOAL #1   Title  Patient will be independent in HEP demonstrating successful between session carryover and improve functional mobility and ease of completing ADLs.    Baseline  6/30: HEP given next session 7/29: HEP compliant    Time  2    Period  Weeks    Status  Achieved    Target Date  07/30/18      PT SHORT TERM GOAL #2   Title  Patient will be able to transfer sit<>Stand from regular height chair without pushing on arm rests to exhibit improved independence improved functional strength.     Baseline  6/30: requires excessive BUE support 7/20 : able to perform    Time  2    Period  Weeks    Status  Achieved    Target Date  07/30/18        PT Long Term Goals - 10/29/18 1059      PT LONG TERM GOAL #1   Title  Patient will increase six minute walk test distance to >1000 for progression to community ambulator and improve gait ability    Baseline  6/30: 880 ft with rollator 7/29: 882 ft with 3 minutes w/o rollator 8/19: 880 ft without AD 10/13: 901 ft with no AD and one near LOB    Time  8    Period  Weeks    Status  Partially Met    Target Date  12/24/18      PT LONG TERM GOAL #2   Title  Patient (> 38 years old) will complete five times sit to stand test in < 15 seconds without UE support indicating an increased LE strength and improved balance.    Baseline  6/30: 16 seconds with excessive BUE  support 7/29: 15 seconds with SUE support and one LOB 8/19: 14 seconds with SUE support. 10/13: 12 seconds SUE support, 32 seconds no UE support    Time  8    Period  Weeks    Status  Partially Met    Target Date  12/24/18      PT LONG TERM GOAL #3   Title  Patient will demonstrate an improved Berg Balance Score of >42/56 as to demonstrate improved balance with ADLs such as sitting/standing and transfer  balance and reduced fall risk.     Baseline  6/30: 37/56 7/29: 39/56 8/19:  42/56    Time  8    Period  Weeks    Status  Achieved      PT LONG TERM GOAL #4   Title  Patient will improve LEFS to 54/80 to demonstrate improved fucnctional strenth and mobility and ease of indepedence    Baseline  6/30: 32/80 7/29: 39/80 8/19: 41/80  10/13: 42/80    Time  8    Period  Weeks    Status  Partially Met    Target Date  12/24/18      PT LONG TERM GOAL #5   Title  Patient will demonstrate an improved Berg Balance Score of >48/56 as to demonstrate improved balance with ADLs such as sitting/standing and transfer balance and reduced fall risk.    Baseline  8/19: 42/56 10/13; 45/80    Time  8    Period  Weeks    Status  Partially Met    Target Date  12/24/18      PT LONG TERM GOAL #6   Title  Patient will increase dynamic gait index score to >19/24 as to demonstrate reduced fall risk and improved dynamic gait balance for better safety with community/home ambulation.    Baseline  10/13: 9/24    Time  8    Period  Weeks    Status  New    Target Date  12/24/18            Plan - 11/12/18 1034    Clinical Impression Statement  Patient remains motivated to participate with therapy, though has noticeable hesitation/fear when attempting to improve step length/placement. Pt also requires at least unilateral support for more dynamic activities, difficult to assess true need of physical assistance versus fear response. Overall the patient would continue to benefit from further skilled PT  intervention to continue to progress towards goals.    Personal Factors and Comorbidities  Age;Comorbidity 3+;Fitness;Past/Current Experience;Social Background;Time since onset of injury/illness/exacerbation    Comorbidities  anemia, cancer, celiac disease, dyspnea, meningioma, osteoporosis, HLD    Examination-Activity Limitations  Bed Mobility;Bathing;Bend;Carry;Dressing;Hygiene/Grooming;Locomotion Level;Reach Overhead;Sit;Squat;Stairs;Stand;Toileting;Transfers    Examination-Participation Restrictions  Church;Cleaning;Community Activity;Driving;Interpersonal Relationship;Meal Prep;Laundry;Shop;Volunteer;Yard Work    [employer] Potential  Fair    Clinical Impairments Affecting Rehab Potential  (+) improvement made within last year, eagerness to participate (-) HEP compliance, safety awareness     PT Frequency  2x / week    PT Duration  8 weeks    PT Treatment/Interventions  ADLs/Self Care Home Management;Cryotherapy;Electrical Stimulation;Iontophoresis 66m/ml Dexamethasone;Moist Heat;Ultrasound;DME Instruction;Gait training;Stair training;Balance training;Therapeutic exercise;Therapeutic activities;Functional mobility training;Neuromuscular re-education;Patient/family education;Manual techniques;Passive range of motion;Energy conservation    PT Next Visit Plan  progress strength and stability    PT Home Exercise Plan  standing hip abduction, standing marches, standing hip extension, standing hamstring curls, mini squats; kitchen sink tandem stance and SLS    Consulted and Agree with Plan of Care  Patient       Patient will benefit from skilled therapeutic intervention in order to improve the following deficits and impairments:  Abnormal gait, Decreased activity tolerance, Decreased balance, Decreased coordination, Decreased endurance, Decreased mobility, Decreased range of motion, Decreased safety awareness, Decreased strength, Difficulty walking, Impaired flexibility, Impaired perceived functional  ability, Improper body mechanics, Postural dysfunction, Pain, Impaired UE functional use, Decreased knowledge of precautions, Cardiopulmonary status limiting activity, Decreased knowledge of use of DME  Visit Diagnosis: Muscle weakness (generalized)  Unsteadiness on  feet  Neurologic gait disorder     Problem List Patient Active Problem List   Diagnosis Date Noted  . Cognitive deficits   . Labile blood pressure   . Acute blood loss anemia   . Slow transit constipation   . Vascular headache   . 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 Hydrocephalus ([place]) 07/03/2017  . 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 Pelvic fracture ([place]) 04/18/2016  . CD (celiac disease) 08/04/2014  . Cancer of upper lobe of right lung ([place]) 08/04/2014  . Age related osteoporosis 11/26/2013  . Absolute anemia 05/21/2013  . H/O malignant neoplasm 05/21/2013  . HLD (hyperlipidemia) 05/21/2013   Lieutenant Diego PT, DPT 10:41 AM,11/12/18 [place] MAIN [facility] SERVICES [address] [place], [place], [address] Phone: [phone]   Fax:  [phone]  Name: Krista Evans MRN: [id number] Date of Birth: Dec 21, 1945

## 2018-11-14 ENCOUNTER — Ambulatory Visit: Payer: PPO

## 2018-11-18 ENCOUNTER — Ambulatory Visit: Payer: PPO | Attending: Internal Medicine

## 2018-11-18 DIAGNOSIS — R2681 Unsteadiness on feet: Secondary | ICD-10-CM

## 2018-11-18 DIAGNOSIS — M6281 Muscle weakness (generalized): Secondary | ICD-10-CM

## 2018-11-18 DIAGNOSIS — R269 Unspecified abnormalities of gait and mobility: Secondary | ICD-10-CM | POA: Diagnosis not present

## 2018-11-18 NOTE — Therapy (Signed)
[place] MAIN [facility] SERVICES [address] [place], [place], [address] Phone: [phone]   Fax:  [phone]  Physical Therapy Treatment  Patient Details  Name: Krista Evans MRN: [id number] Date of Birth: [birth year] No data recorded  Encounter Date: 11/18/2018  PT End of Session - 11/18/18 0920    Visit Number  35    Number of Visits  46    Date for PT Re-Evaluation  12/24/18    Authorization Type  4/10 PN 10/29/18    PT Start Time  0921    PT Stop Time  1005    PT Time Calculation (min)  44 min    Equipment Utilized During Treatment  Gait belt    Activity Tolerance  Patient tolerated treatment well;Patient limited by fatigue       Past Medical History:  Diagnosis Date  . Abnormal Q waves on electrocardiogram   . Anemia   . Aortic atherosclerosis ([place])    "I could possibly have it, my grandmother had it"  . Cancer [facility]) 05/2011   Right upper Lung CA with partial Lobectomy.  . Celiac disease   . Celiac disease   . Dyspnea    with exertion  . Hyperlipidemia   . Hyperlipidemia   . Lung mass   . Meningioma ([place])   . Osteoporosis   . Personal history of chemotherapy   . Personal history of radiation therapy     Past Surgical History:  Procedure Laterality Date  . LUNG LOBECTOMY     right lung  . 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  Subjective Assessment - 11/18/18 0924    Subjective  Patient reported no change in medications, no falls/stumbles since last visit, no pain. Compliant with HEP daily.    Pertinent History  Patient is returning to outpatient physical therapy after closure for COVID. Due to patient's last session being in March, patient is being re-evaluated today. PMH includes anemia, aortic atherosclerosis, cancer,  celiac disease, dyspnea, HLD, meningioma, osteoporosis, history of chemo/radiation therapy, ventriculoperitoneal shunt (07/03/17).    Limitations  Lifting;Standing;Walking;House hold activities;Other (comment)    How long can you sit comfortably?  n/a    How long can you stand comfortably?  5 minutes    How long can you walk comfortably?  400 ft before fatigue/out of breathe.    Patient Stated Goals  Patient wants to be able to step up onto a curb without holding on    Currently in Pain?  No/denies        Nustep level 3 4 minutes, RPM>60 for cardiovascular and musculoskeletal challenge.    TherEx:     Quantum leg press: bilateral LE #120; very winded, fatigued after set.   Quantum leg press, single LE;#45 d 15x. Cueing for neutral alignment and muscle recruitment.   NMR : Pt needed CGA throughout, and maximum encouragement   airex pad, 7" step holding foot on each surface, 30 seconds holds each LE, 3 sets each LE, no UE support   Feet together 3x30sec foam no UE support  airex pad Standing toe taps on 7" step, intermittent cues for SUE, very challenging for patient to perform with SUE    Lateral step over hurdles, SUE x5 reps bilaterally, 3-4 reps with BUE support, occasional LOB minA to correct/BUE to  correct  Forward step over hurdles/backwards step over hurdles x10 bilaterally    Pt educated throughout session about proper posture and technique with exercises. Improved exercise technique, movement at target joints, use of target muscles after min to mod verbal, visual, tactile cues   Pt response/clinical impression: Session focused on promoting LE strengthening as well as decreased UE support with balance training this session. Pt was able to decrease UE compared to previous sessions, but fatigued more quickly, needed more rest breaks this session. Very challenged by leg press. The patient would benefit from further skilled PT intervention to maximize safety and mobility.       PT Education - 11/18/18 0919    Education provided  Yes    Education Details  step length, exercise technique    Person(s) Educated  Patient    Methods  Explanation;Demonstration;Tactile cues;Verbal cues    Comprehension  Verbalized understanding;Returned demonstration;Verbal cues required;Tactile cues required;Need further instruction       PT Short Term Goals - 10/29/18 1059      PT SHORT TERM GOAL #1   Title  Patient will be independent in HEP demonstrating successful between session carryover and improve functional mobility and ease of completing ADLs.    Baseline  6/30: HEP given next session 7/29: HEP compliant    Time  2    Period  Weeks    Status  Achieved    Target Date  07/30/18      PT SHORT TERM GOAL #2   Title  Patient will be able to transfer sit<>Stand from regular height chair without pushing on arm rests to exhibit improved independence improved functional strength.     Baseline  6/30: requires excessive BUE support 7/20 : able to perform    Time  2    Period  Weeks    Status  Achieved    Target Date  07/30/18        PT Long Term Goals - 10/29/18 1059      PT LONG TERM GOAL #1   Title  Patient will increase six minute walk test distance to >1000 for progression to community ambulator and improve gait ability    Baseline  6/30: 880 ft with rollator 7/29: 882 ft with 3 minutes w/o rollator 8/19: 880 ft without AD 10/13: 901 ft with no AD and one near LOB    Time  8    Period  Weeks    Status  Partially Met    Target Date  12/24/18      PT LONG TERM GOAL #2   Title  Patient (> 70 years old) will complete five times sit to stand test in < 15 seconds without UE support indicating an increased LE strength and improved balance.    Baseline  6/30: 16 seconds with excessive BUE support 7/29: 15 seconds with SUE support and one LOB 8/19: 14 seconds with SUE support. 10/13: 12 seconds SUE support, 32 seconds no UE support    Time  8    Period  Weeks     Status  Partially Met    Target Date  12/24/18      PT LONG TERM GOAL #3   Title  Patient will demonstrate an improved Berg Balance Score of >42/56 as to demonstrate improved balance with ADLs such as sitting/standing and transfer balance and reduced fall risk.     Baseline  6/30: 37/56 7/29: 39/56 8/19:  42/56    Time  8  Period  Weeks    Status  Achieved      PT LONG TERM GOAL #4   Title  Patient will improve LEFS to 54/80 to demonstrate improved fucnctional strenth and mobility and ease of indepedence    Baseline  6/30: 32/80 7/29: 39/80 8/19: 41/80  10/13: 42/80    Time  8    Period  Weeks    Status  Partially Met    Target Date  12/24/18      PT LONG TERM GOAL #5   Title  Patient will demonstrate an improved Berg Balance Score of >48/56 as to demonstrate improved balance with ADLs such as sitting/standing and transfer balance and reduced fall risk.    Baseline  8/19: 42/56 10/13; 45/80    Time  8    Period  Weeks    Status  Partially Met    Target Date  12/24/18      PT LONG TERM GOAL #6   Title  Patient will increase dynamic gait index score to >19/24 as to demonstrate reduced fall risk and improved dynamic gait balance for better safety with community/home ambulation.    Baseline  10/13: 9/24    Time  8    Period  Weeks    Status  New    Target Date  12/24/18            Plan - 11/18/18 0924    Clinical Impression Statement  Session focused on promoting LE strengthening as well as decreased UE support with balance training this session. Pt was able to decrease UE compared to previous sessions, but fatigued more quickly, needed more rest breaks this session. Very challenged by leg press. The patient would benefit from further skilled PT intervention to maximize safety and mobility.    Personal Factors and Comorbidities  Age;Comorbidity 3+;Fitness;Past/Current Experience;Social Background;Time since onset of injury/illness/exacerbation    Comorbidities  anemia,  cancer, celiac disease, dyspnea, meningioma, osteoporosis, HLD    Examination-Activity Limitations  Bed Mobility;Bathing;Bend;Carry;Dressing;Hygiene/Grooming;Locomotion Level;Reach Overhead;Sit;Squat;Stairs;Stand;Toileting;Transfers    Examination-Participation Restrictions  Church;Cleaning;Community Activity;Driving;Interpersonal Relationship;Meal Prep;Laundry;Shop;Volunteer;Yard Work    [employer] Potential  Fair    Clinical Impairments Affecting Rehab Potential  (+) improvement made within last year, eagerness to participate (-) HEP compliance, safety awareness     PT Frequency  2x / week    PT Duration  8 weeks    PT Treatment/Interventions  ADLs/Self Care Home Management;Cryotherapy;Electrical Stimulation;Iontophoresis 92m/ml Dexamethasone;Moist Heat;Ultrasound;DME Instruction;Gait training;Stair training;Balance training;Therapeutic exercise;Therapeutic activities;Functional mobility training;Neuromuscular re-education;Patient/family education;Manual techniques;Passive range of motion;Energy conservation    PT Next Visit Plan  progress strength and stability    PT Home Exercise Plan  standing hip abduction, standing marches, standing hip extension, standing hamstring curls, mini squats; kitchen sink tandem stance and SLS    Consulted and Agree with Plan of Care  Patient       Patient will benefit from skilled therapeutic intervention in order to improve the following deficits and impairments:  Abnormal gait, Decreased activity tolerance, Decreased balance, Decreased coordination, Decreased endurance, Decreased mobility, Decreased range of motion, Decreased safety awareness, Decreased strength, Difficulty walking, Impaired flexibility, Impaired perceived functional ability, Improper body mechanics, Postural dysfunction, Pain, Impaired UE functional use, Decreased knowledge of precautions, Cardiopulmonary status limiting activity, Decreased knowledge of use of DME  Visit Diagnosis: Muscle weakness  (generalized)  Unsteadiness on feet  Neurologic gait disorder     Problem List Patient Active Problem List   Diagnosis Date Noted  . Cognitive deficits   . Labile  blood pressure   . Acute blood loss anemia   . Slow transit constipation   . Vascular headache   . 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 Hydrocephalus ([place]) 07/03/2017  . 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 Pelvic fracture ([place]) 04/18/2016  . CD (celiac disease) 08/04/2014  . Cancer of upper lobe of right lung ([place]) 08/04/2014  . Age related osteoporosis 11/26/2013  . Absolute anemia 05/21/2013  . H/O malignant neoplasm 05/21/2013  . HLD (hyperlipidemia) 05/21/2013    Lieutenant Diego PT, DPT 10:08 AM,11/18/18 [place] MAIN [facility] SERVICES [address] [place], [place], [address] Phone: [phone]   Fax:  [phone]  Name: Krista Evans MRN: [id number] Date of Birth: 01-31-1945

## 2018-11-20 ENCOUNTER — Ambulatory Visit: Payer: PPO

## 2018-11-22 ENCOUNTER — Ambulatory Visit: Admit: 2018-11-22 | Payer: PPO | Admitting: Ophthalmology

## 2018-11-22 SURGERY — PHACOEMULSIFICATION, CATARACT, WITH IOL INSERTION
Anesthesia: Regional | Laterality: Right

## 2018-11-25 ENCOUNTER — Other Ambulatory Visit: Payer: Self-pay

## 2018-11-25 ENCOUNTER — Ambulatory Visit: Payer: PPO

## 2018-11-25 DIAGNOSIS — R2681 Unsteadiness on feet: Secondary | ICD-10-CM

## 2018-11-25 DIAGNOSIS — M6281 Muscle weakness (generalized): Secondary | ICD-10-CM

## 2018-11-25 DIAGNOSIS — R269 Unspecified abnormalities of gait and mobility: Secondary | ICD-10-CM

## 2018-11-25 NOTE — Therapy (Signed)
[place] MAIN [facility] SERVICES [address] [place], [place], [address] Phone: [phone]   Fax:  [phone]  Physical Therapy Treatment  Patient Details  Name: Krista Evans MRN: [id number] Date of Birth: [birth year] No data recorded  Encounter Date: 11/25/2018  PT End of Session - 11/25/18 0846    Visit Number  36    Number of Visits  46    Date for PT Re-Evaluation  12/24/18    Authorization Type  6/10 PN 10/29/18    PT Start Time  0842    PT Stop Time  0926    PT Time Calculation (min)  44 min    Equipment Utilized During Treatment  Gait belt    Activity Tolerance  Patient tolerated treatment well;Patient limited by fatigue       Past Medical History:  Diagnosis Date  . Abnormal Q waves on electrocardiogram   . Anemia   . Aortic atherosclerosis ([place])    "I could possibly have it, my grandmother had it"  . Cancer [facility]) 05/2011   Right upper Lung CA with partial Lobectomy.  . Celiac disease   . Celiac disease   . Dyspnea    with exertion  . Hyperlipidemia   . Hyperlipidemia   . Lung mass   . Meningioma ([place])   . Osteoporosis   . Personal history of chemotherapy   . Personal history of radiation therapy     Past Surgical History:  Procedure Laterality Date  . LUNG LOBECTOMY     right lung  . 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  Subjective Assessment - 11/25/18 0845    Subjective  Patient reports compliance with HEP, no fall or LOB since last session.    Pertinent History  Patient is returning to outpatient physical therapy after closure for COVID. Due to patient's last session being in March, patient is being re-evaluated today. PMH includes anemia, aortic atherosclerosis, cancer, celiac disease, dyspnea, HLD, meningioma,  osteoporosis, history of chemo/radiation therapy, ventriculoperitoneal shunt (07/03/17).    Limitations  Lifting;Standing;Walking;House hold activities;Other (comment)    How long can you sit comfortably?  n/a    How long can you stand comfortably?  5 minutes    How long can you walk comfortably?  400 ft before fatigue/out of breathe.    Patient Stated Goals  Patient wants to be able to step up onto a curb without holding on    Currently in Pain?  No/denies        Treatment:    Nustep level 3 3 minutes, RPM>60 for cardiovascular and musculoskeletal challenge.       Ambulate in hallway>1100 ftwithnoUE supportone nearLOB when stopping ambulation requiring assistance to maintain stability; no standing rest breaks requiredImproved gait mechanics and arm swing. ; CGA with cueing for direction and posture.one near LOB with sudden stop  Standing holding onto bar: -RTB hip extension 15x each LE, BUE support RTB hip abduction 15x each LE,   2.5 lb bar: -seated row 12x -chest press 12x -overhead press 12x   Seated hamstring curl RTB 20x each LE Seated IR/ER RTB around ankles 15x each LE; ball between knees for positional alignment of knee/ challenged with coordination of LLE.   Holding onto bar: squat to chair with cueing for pelvic tilt 10x  airex pad, 7" step holding foot on each surface, 30 seconds holds each LE, 3 sets each LE, no UE support   airex pad Standing toe taps on 7" step, intermittent cues for SUE, very challenging for patient to perform with SUE       Pt educated throughout session about proper posture and technique with exercises. Improved exercise technique, movement at target joints, use of target muscles after min to mod verbal, visual, tactile cues                       PT Education - 11/25/18 0845    Education provided  Yes    Education Details  exercise technique, body mechanics    Person(s) Educated  Patient    Methods   Explanation;Demonstration;Tactile cues;Verbal cues    Comprehension  Verbalized understanding;Returned demonstration;Verbal cues required;Tactile cues required       PT Short Term Goals - 10/29/18 1059      PT SHORT TERM GOAL #1   Title  Patient will be independent in HEP demonstrating successful between session carryover and improve functional mobility and ease of completing ADLs.    Baseline  6/30: HEP given next session 7/29: HEP compliant    Time  2    Period  Weeks    Status  Achieved    Target Date  07/30/18      PT SHORT TERM GOAL #2   Title  Patient will be able to transfer sit<>Stand from regular height chair without pushing on arm rests to exhibit improved independence improved functional strength.     Baseline  6/30: requires excessive BUE support 7/20 : able to perform    Time  2    Period  Weeks    Status  Achieved    Target Date  07/30/18        PT Long Term Goals - 10/29/18 1059      PT LONG TERM GOAL #1   Title  Patient will increase six minute walk test distance to >1000 for progression to community ambulator and improve gait ability    Baseline  6/30: 880 ft with rollator 7/29: 882 ft with 3 minutes w/o rollator 8/19: 880 ft without AD 10/13: 901 ft with no AD and one near LOB    Time  8    Period  Weeks    Status  Partially Met    Target Date  12/24/18      PT LONG TERM GOAL #2   Title  Patient (> 75 years old) will complete five times sit to stand test in < 15 seconds without UE support indicating an increased LE strength and improved balance.    Baseline  6/30: 16 seconds with excessive BUE support 7/29: 15 seconds with SUE support and one LOB 8/19: 14 seconds with SUE support. 10/13: 12 seconds SUE support, 32 seconds no UE support    Time  8    Period  Weeks    Status  Partially Met    Target Date  12/24/18      PT LONG TERM GOAL #3   Title  Patient will demonstrate an improved Berg Balance Score of >42/56 as to demonstrate improved balance with  ADLs such as sitting/standing and transfer balance and reduced fall risk.     Baseline  6/30: 37/56 7/29: 39/56 8/19:  42/56    Time  8    Period  Weeks    Status  Achieved  PT LONG TERM GOAL #4   Title  Patient will improve LEFS to 54/80 to demonstrate improved fucnctional strenth and mobility and ease of indepedence    Baseline  6/30: 32/80 7/29: 39/80 8/19: 41/80  10/13: 42/80    Time  8    Period  Weeks    Status  Partially Met    Target Date  12/24/18      PT LONG TERM GOAL #5   Title  Patient will demonstrate an improved Berg Balance Score of >48/56 as to demonstrate improved balance with ADLs such as sitting/standing and transfer balance and reduced fall risk.    Baseline  8/19: 42/56 10/13; 45/80    Time  8    Period  Weeks    Status  Partially Met    Target Date  12/24/18      PT LONG TERM GOAL #6   Title  Patient will increase dynamic gait index score to >19/24 as to demonstrate reduced fall risk and improved dynamic gait balance for better safety with community/home ambulation.    Baseline  10/13: 9/24    Time  8    Period  Weeks    Status  New    Target Date  12/24/18            Plan - 11/25/18 7829    Clinical Impression Statement  Patient presents with excellent motivation throughout session. Missed last session due to not feeling well. Patient fatigues quickly throughout session requiring rest breaks and cues for breathing. Has noticeable hesitation/fear when attempting to improve step length/placement.The patient would benefit from further skilled PT intervention to maximize safety and mobility.    Personal Factors and Comorbidities  Age;Comorbidity 3+;Fitness;Past/Current Experience;Social Background;Time since onset of injury/illness/exacerbation    Comorbidities  anemia, cancer, celiac disease, dyspnea, meningioma, osteoporosis, HLD    Examination-Activity Limitations  Bed Mobility;Bathing;Bend;Carry;Dressing;Hygiene/Grooming;Locomotion Level;Reach  Overhead;Sit;Squat;Stairs;Stand;Toileting;Transfers    Examination-Participation Restrictions  Church;Cleaning;Community Activity;Driving;Interpersonal Relationship;Meal Prep;Laundry;Shop;Volunteer;Yard Work    [employer] Potential  Fair    Clinical Impairments Affecting Rehab Potential  (+) improvement made within last year, eagerness to participate (-) HEP compliance, safety awareness     PT Frequency  2x / week    PT Duration  8 weeks    PT Treatment/Interventions  ADLs/Self Care Home Management;Cryotherapy;Electrical Stimulation;Iontophoresis 9m/ml Dexamethasone;Moist Heat;Ultrasound;DME Instruction;Gait training;Stair training;Balance training;Therapeutic exercise;Therapeutic activities;Functional mobility training;Neuromuscular re-education;Patient/family education;Manual techniques;Passive range of motion;Energy conservation    PT Next Visit Plan  progress strength and stability    PT Home Exercise Plan  standing hip abduction, standing marches, standing hip extension, standing hamstring curls, mini squats; kitchen sink tandem stance and SLS    Consulted and Agree with Plan of Care  Patient       Patient will benefit from skilled therapeutic intervention in order to improve the following deficits and impairments:  Abnormal gait, Decreased activity tolerance, Decreased balance, Decreased coordination, Decreased endurance, Decreased mobility, Decreased range of motion, Decreased safety awareness, Decreased strength, Difficulty walking, Impaired flexibility, Impaired perceived functional ability, Improper body mechanics, Postural dysfunction, Pain, Impaired UE functional use, Decreased knowledge of precautions, Cardiopulmonary status limiting activity, Decreased knowledge of use of DME  Visit Diagnosis: Muscle weakness (generalized)  Unsteadiness on feet  Neurologic gait disorder     Problem List Patient Active Problem List   Diagnosis Date Noted  . Cognitive deficits   . Labile blood  pressure   . Acute blood loss anemia   . Slow transit constipation   . Vascular headache   .  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 Hydrocephalus ([place]) 07/03/2017  . 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 Pelvic fracture ([place]) 04/18/2016  . CD (celiac disease) 08/04/2014  . Cancer of upper lobe of right lung ([place]) 08/04/2014  . Age related osteoporosis 11/26/2013  . Absolute anemia 05/21/2013  . H/O malignant neoplasm 05/21/2013  . HLD (hyperlipidemia) 05/21/2013   Janna Arch, PT, DPT   11/25/2018, 9:27 AM  [place] MAIN [facility] SERVICES [address] [place], [place], [address] Phone: [phone]   Fax:  [phone]  Name: Krista Evans MRN: [id number] Date of Birth: Jun 11, 1945

## 2018-11-27 ENCOUNTER — Ambulatory Visit: Payer: PPO

## 2018-12-02 ENCOUNTER — Other Ambulatory Visit: Payer: Self-pay

## 2018-12-02 ENCOUNTER — Ambulatory Visit: Payer: PPO

## 2018-12-02 DIAGNOSIS — R269 Unspecified abnormalities of gait and mobility: Secondary | ICD-10-CM

## 2018-12-02 DIAGNOSIS — M6281 Muscle weakness (generalized): Secondary | ICD-10-CM

## 2018-12-02 DIAGNOSIS — R2681 Unsteadiness on feet: Secondary | ICD-10-CM

## 2018-12-02 NOTE — Therapy (Signed)
[place] MAIN [facility] SERVICES [address] [place], [place], [address] Phone: [phone]   Fax:  [phone]  Physical Therapy Treatment  Patient Details  Name: Krista Evans MRN: [id number] Date of Birth: [birth year] No data recorded  Encounter Date: 12/02/2018  PT End of Session - 12/02/18 0850    Visit Number  37    Number of Visits  46    Date for PT Re-Evaluation  12/24/18    Authorization Type  7/10 PN 10/29/18    PT Start Time  0845    PT Stop Time  0929    PT Time Calculation (min)  44 min    Equipment Utilized During Treatment  Gait belt    Activity Tolerance  Patient tolerated treatment well;Patient limited by fatigue       Past Medical History:  Diagnosis Date  . Abnormal Q waves on electrocardiogram   . Anemia   . Aortic atherosclerosis ([place])    "I could possibly have it, my grandmother had it"  . Cancer [facility]) 05/2011   Right upper Lung CA with partial Lobectomy.  . Celiac disease   . Celiac disease   . Dyspnea    with exertion  . Hyperlipidemia   . Hyperlipidemia   . Lung mass   . Meningioma ([place])   . Osteoporosis   . Personal history of chemotherapy   . Personal history of radiation therapy     Past Surgical History:  Procedure Laterality Date  . LUNG LOBECTOMY     right lung  . 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  Subjective Assessment - 12/02/18 0849    Subjective  Patient reports compliance with HEP, has not been able to walk outside due to the weather. No falls or LOB since last session.    Pertinent History  Patient is returning to outpatient physical therapy after closure for COVID. Due to patient's last session being in March, patient is being re-evaluated today. PMH includes anemia, aortic  atherosclerosis, cancer, celiac disease, dyspnea, HLD, meningioma, osteoporosis, history of chemo/radiation therapy, ventriculoperitoneal shunt (07/03/17).    Limitations  Lifting;Standing;Walking;House hold activities;Other (comment)    How long can you sit comfortably?  n/a    How long can you stand comfortably?  5 minutes    How long can you walk comfortably?  400 ft before fatigue/out of breathe.    Patient Stated Goals  Patient wants to be able to step up onto a curb without holding on    Currently in Pain?  No/denies         Treatment:     Nustep level 3 3 minutes, RPM>60 for cardiovascular and musculoskeletal challenge.       Ambulate in hallway >1100 ft  with no UE support one near LOB when stopping ambulation requiring assistance to maintain stability; no standing rest breaks required Improved gait mechanics and arm swing. ; CGA with cueing for direction and posture. one near LOB with sudden stop   Standing holding onto bar: -RTB hip extension 15x each LE, BUE support RTB hip abduction 15x each LE,    Holding onto bar: squat to chair with cueing for pelvic tilt 10x    airex pad, 7" step holding foot on each surface, 30 seconds holds each LE, 3 sets each LE, no  UE support    airex pad Standing toe taps on 7" step, intermittent cues for SUE, very challenging for patient to perform with SUE     Access Code: [email or web address]  URL: [email or web address]  Date: 12/02/2018  Prepared by: Janna Arch   Exercises Side Step Overs with Cones and Counter Support - 10 reps - 2 sets - 5 hold - 1x daily - 7x weekly Side Stepping with Resistance at Thighs and Counter Support - 10 reps - 2 sets - 5 hold - 1x daily - 7x weekly Standing Hamstring Curl with Chair Support - 10 reps - 2 sets - 5 hold - 1x daily - 7x weekly Standing Romberg to 1/2 Tandem Stance - 10 reps - 2 sets - 5 hold - 1x daily - 7x weekly Wide Stance with Head Nods and Counter Support - 10 reps - 2 sets - 5 hold -  1x daily - 7x weekly Wide Stance with Head Rotations and Counter Support - 10 reps - 2 sets - 5 hold - 1x daily - 7x weekly Seated Ankle Alphabet - 2 reps - 2 sets - 5 hold - 1x daily - 7x weekly   Pt educated throughout session about proper posture and technique with exercises. Improved exercise technique, movement at target joints, use of target muscles after min to mod verbal, visual, tactile cues                      PT Education - 12/02/18 0849    Education provided  Yes    Education Details  exercise technique, body mechanics    Person(s) Educated  Patient    Methods  Explanation;Demonstration;Tactile cues;Verbal cues    Comprehension  Verbalized understanding;Returned demonstration;Verbal cues required;Tactile cues required       PT Short Term Goals - 10/29/18 1059      PT SHORT TERM GOAL #1   Title  Patient will be independent in HEP demonstrating successful between session carryover and improve functional mobility and ease of completing ADLs.    Baseline  6/30: HEP given next session 7/29: HEP compliant    Time  2    Period  Weeks    Status  Achieved    Target Date  07/30/18      PT SHORT TERM GOAL #2   Title  Patient will be able to transfer sit<>Stand from regular height chair without pushing on arm rests to exhibit improved independence improved functional strength.     Baseline  6/30: requires excessive BUE support 7/20 : able to perform    Time  2    Period  Weeks    Status  Achieved    Target Date  07/30/18        PT Long Term Goals - 10/29/18 1059      PT LONG TERM GOAL #1   Title  Patient will increase six minute walk test distance to >1000 for progression to community ambulator and improve gait ability    Baseline  6/30: 880 ft with rollator 7/29: 882 ft with 3 minutes w/o rollator 8/19: 880 ft without AD 10/13: 901 ft with no AD and one near LOB    Time  8    Period  Weeks    Status  Partially Met    Target Date  12/24/18      PT  LONG TERM GOAL #2   Title  Patient (> 75 years old) will complete five times sit to stand test in <  15 seconds without UE support indicating an increased LE strength and improved balance.    Baseline  6/30: 16 seconds with excessive BUE support 7/29: 15 seconds with SUE support and one LOB 8/19: 14 seconds with SUE support. 10/13: 12 seconds SUE support, 32 seconds no UE support    Time  8    Period  Weeks    Status  Partially Met    Target Date  12/24/18      PT LONG TERM GOAL #3   Title  Patient will demonstrate an improved Berg Balance Score of >42/56 as to demonstrate improved balance with ADLs such as sitting/standing and transfer balance and reduced fall risk.     Baseline  6/30: 37/56 7/29: 39/56 8/19:  42/56    Time  8    Period  Weeks    Status  Achieved      PT LONG TERM GOAL #4   Title  Patient will improve LEFS to 54/80 to demonstrate improved fucnctional strenth and mobility and ease of indepedence    Baseline  6/30: 32/80 7/29: 39/80 8/19: 41/80  10/13: 42/80    Time  8    Period  Weeks    Status  Partially Met    Target Date  12/24/18      PT LONG TERM GOAL #5   Title  Patient will demonstrate an improved Berg Balance Score of >48/56 as to demonstrate improved balance with ADLs such as sitting/standing and transfer balance and reduced fall risk.    Baseline  8/19: 42/56 10/13; 45/80    Time  8    Period  Weeks    Status  Partially Met    Target Date  12/24/18      PT LONG TERM GOAL #6   Title  Patient will increase dynamic gait index score to >19/24 as to demonstrate reduced fall risk and improved dynamic gait balance for better safety with community/home ambulation.    Baseline  10/13: 9/24    Time  8    Period  Weeks    Status  New    Target Date  12/24/18            Plan - 12/02/18 1104    Clinical Impression Statement  Patient presents with good motivation throughout session. She is educated on and performs new HEP demonstrating understanding for  progression of program. Ambulation continues to challenge her long duration capacity at this time with her fatiguing quickly with prolonged mobility. The patient would benefit from further skilled PT intervention to maximize safety and mobility.    Personal Factors and Comorbidities  Age;Comorbidity 3+;Fitness;Past/Current Experience;Social Background;Time since onset of injury/illness/exacerbation    Comorbidities  anemia, cancer, celiac disease, dyspnea, meningioma, osteoporosis, HLD    Examination-Activity Limitations  Bed Mobility;Bathing;Bend;Carry;Dressing;Hygiene/Grooming;Locomotion Level;Reach Overhead;Sit;Squat;Stairs;Stand;Toileting;Transfers    Examination-Participation Restrictions  Church;Cleaning;Community Activity;Driving;Interpersonal Relationship;Meal Prep;Laundry;Shop;Volunteer;Yard Work    [employer] Potential  Fair    Clinical Impairments Affecting Rehab Potential  (+) improvement made within last year, eagerness to participate (-) HEP compliance, safety awareness     PT Frequency  2x / week    PT Duration  8 weeks    PT Treatment/Interventions  ADLs/Self Care Home Management;Cryotherapy;Electrical Stimulation;Iontophoresis 64m/ml Dexamethasone;Moist Heat;Ultrasound;DME Instruction;Gait training;Stair training;Balance training;Therapeutic exercise;Therapeutic activities;Functional mobility training;Neuromuscular re-education;Patient/family education;Manual techniques;Passive range of motion;Energy conservation    PT Next Visit Plan  progress strength and stability    PT Home Exercise Plan  standing hip abduction, standing marches, standing hip extension, standing hamstring  curls, mini squats; kitchen sink tandem stance and SLS    Consulted and Agree with Plan of Care  Patient       Patient will benefit from skilled therapeutic intervention in order to improve the following deficits and impairments:  Abnormal gait, Decreased activity tolerance, Decreased balance, Decreased coordination,  Decreased endurance, Decreased mobility, Decreased range of motion, Decreased safety awareness, Decreased strength, Difficulty walking, Impaired flexibility, Impaired perceived functional ability, Improper body mechanics, Postural dysfunction, Pain, Impaired UE functional use, Decreased knowledge of precautions, Cardiopulmonary status limiting activity, Decreased knowledge of use of DME  Visit Diagnosis: Muscle weakness (generalized)  Unsteadiness on feet  Neurologic gait disorder     Problem List Patient Active Problem List   Diagnosis Date Noted  . Cognitive deficits   . Labile blood pressure   . Acute blood loss anemia   . Slow transit constipation   . Vascular headache   . 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 Hydrocephalus ([place]) 07/03/2017  . 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 Pelvic fracture ([place]) 04/18/2016  . CD (celiac disease) 08/04/2014  . Cancer of upper lobe of right lung ([place]) 08/04/2014  . Age related osteoporosis 11/26/2013  . Absolute anemia 05/21/2013  . H/O malignant neoplasm 05/21/2013  . HLD (hyperlipidemia) 05/21/2013   Janna Arch, PT, DPT   12/02/2018, 11:05 AM  [place] MAIN [facility] SERVICES [address] [place], [place], [address] Phone: [phone]   Fax:  [phone]  Name: Krista Evans MRN: [id number] Date of Birth: [birth year]

## 2018-12-04 ENCOUNTER — Ambulatory Visit: Payer: PPO

## 2018-12-04 ENCOUNTER — Other Ambulatory Visit: Payer: Self-pay

## 2018-12-04 DIAGNOSIS — R269 Unspecified abnormalities of gait and mobility: Secondary | ICD-10-CM

## 2018-12-04 DIAGNOSIS — M6281 Muscle weakness (generalized): Secondary | ICD-10-CM | POA: Diagnosis not present

## 2018-12-04 DIAGNOSIS — R2681 Unsteadiness on feet: Secondary | ICD-10-CM

## 2018-12-04 NOTE — Therapy (Signed)
[place] MAIN [facility] SERVICES [address] [place], [place], [address] Phone: [phone]   Fax:  [phone]  Physical Therapy Treatment  Patient Details  Name: OTILLIA CORDONE MRN: [id number] Date of Birth: 28-May-1945 No data recorded  Encounter Date: 12/04/2018  PT End of Session - 12/04/18 0907    Visit Number  38    Number of Visits  46    Date for PT Re-Evaluation  12/24/18    Authorization Type  8/10 PN 10/29/18    PT Start Time  0846    PT Stop Time  0929    PT Time Calculation (min)  43 min    Equipment Utilized During Treatment  Gait belt    Activity Tolerance  Patient tolerated treatment well;Patient limited by fatigue       Past Medical History:  Diagnosis Date  . Abnormal Q waves on electrocardiogram   . Anemia   . Aortic atherosclerosis ([place])    "I could possibly have it, my grandmother had it"  . Cancer [facility]) 05/2011   Right upper Lung CA with partial Lobectomy.  . Celiac disease   . Celiac disease   . Dyspnea    with exertion  . Hyperlipidemia   . Hyperlipidemia   . Lung mass   . Meningioma ([place])   . Osteoporosis   . Personal history of chemotherapy   . Personal history of radiation therapy     Past Surgical History:  Procedure Laterality Date  . LUNG LOBECTOMY     right lung  . 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  Subjective Assessment - 12/04/18 0906    Subjective  Patient reports compliance with new HEP, reports it has been harder to breathe due to it being colder. No falls or LOB since last session.    Pertinent History  Patient is returning to outpatient physical therapy after closure for COVID. Due to patient's last session being in March, patient is being re-evaluated today. PMH includes anemia,  aortic atherosclerosis, cancer, celiac disease, dyspnea, HLD, meningioma, osteoporosis, history of chemo/radiation therapy, ventriculoperitoneal shunt (07/03/17).    Limitations  Lifting;Standing;Walking;House hold activities;Other (comment)    How long can you sit comfortably?  n/a    How long can you stand comfortably?  5 minutes    How long can you walk comfortably?  400 ft before fatigue/out of breathe.    Patient Stated Goals  Patient wants to be able to step up onto a curb without holding on    Currently in Pain?  No/denies              TherEx:  Quantum leg press: bilateral LE #125; 12x each LE, 2 sets, rainbow ball between knees for alignment and proper muscle recruitment. Cueing to exhale with pressing of legs.    Quantum leg press, single LE; #60  15x each LE, 2 sets. Cueing for neutral alignment and muscle recruitment.   5 lb bar: -seated row 12x -chest press 12x -overhead press 12x    Ambulate in hallway >1100 ft  with no UE support one near LOB when stopping ambulation requiring assistance to maintain stability; no standing rest breaks required Improved gait mechanics and arm swing. ; CGA with cueing for direction and posture. one near LOB with sudden stop  Seated modified  windmills 8x each direction   Seated 6" step toe taps for coordination, spatial awareness and muscle contraction timing. 30 seconds x2 trials   Pt educated throughout session about proper posture and technique with exercises. Improved exercise technique, movement at target joints, use of target muscles after min to mod verbal, visual, tactile cues.   Patient is more challenged with breathing this session, reports it is normal for her to be troubled in colder weather due to only having "one lung". Ambulation continues to challenge her long duration capacity at this time with her fatiguing quickly with prolonged mobility. The patient would benefit from further skilled PT intervention to  maximize safety and mobility.         PT Education - 12/04/18 0907    Education provided  Yes    Education Details  exercise technique, breathing with interventions when fatigued    Person(s) Educated  Patient    Methods  Explanation;Demonstration;Tactile cues;Verbal cues    Comprehension  Verbalized understanding;Returned demonstration;Verbal cues required;Tactile cues required       PT Short Term Goals - 10/29/18 1059      PT SHORT TERM GOAL #1   Title  Patient will be independent in HEP demonstrating successful between session carryover and improve functional mobility and ease of completing ADLs.    Baseline  6/30: HEP given next session 7/29: HEP compliant    Time  2    Period  Weeks    Status  Achieved    Target Date  07/30/18      PT SHORT TERM GOAL #2   Title  Patient will be able to transfer sit<>Stand from regular height chair without pushing on arm rests to exhibit improved independence improved functional strength.     Baseline  6/30: requires excessive BUE support 7/20 : able to perform    Time  2    Period  Weeks    Status  Achieved    Target Date  07/30/18        PT Long Term Goals - 10/29/18 1059      PT LONG TERM GOAL #1   Title  Patient will increase six minute walk test distance to >1000 for progression to community ambulator and improve gait ability    Baseline  6/30: 880 ft with rollator 7/29: 882 ft with 3 minutes w/o rollator 8/19: 880 ft without AD 10/13: 901 ft with no AD and one near LOB    Time  8    Period  Weeks    Status  Partially Met    Target Date  12/24/18      PT LONG TERM GOAL #2   Title  Patient (> 2 years old) will complete five times sit to stand test in < 15 seconds without UE support indicating an increased LE strength and improved balance.    Baseline  6/30: 16 seconds with excessive BUE support 7/29: 15 seconds with SUE support and one LOB 8/19: 14 seconds with SUE support. 10/13: 12 seconds SUE support, 32 seconds no UE  support    Time  8    Period  Weeks    Status  Partially Met    Target Date  12/24/18      PT LONG TERM GOAL #3   Title  Patient will demonstrate an improved Berg Balance Score of >42/56 as to demonstrate improved balance with ADLs such as sitting/standing and transfer balance and reduced fall risk.     Baseline  6/30: 37/56  7/29: 39/56 8/19:  42/56    Time  8    Period  Weeks    Status  Achieved      PT LONG TERM GOAL #4   Title  Patient will improve LEFS to 54/80 to demonstrate improved fucnctional strenth and mobility and ease of indepedence    Baseline  6/30: 32/80 7/29: 39/80 8/19: 41/80  10/13: 42/80    Time  8    Period  Weeks    Status  Partially Met    Target Date  12/24/18      PT LONG TERM GOAL #5   Title  Patient will demonstrate an improved Berg Balance Score of >48/56 as to demonstrate improved balance with ADLs such as sitting/standing and transfer balance and reduced fall risk.    Baseline  8/19: 42/56 10/13; 45/80    Time  8    Period  Weeks    Status  Partially Met    Target Date  12/24/18      PT LONG TERM GOAL #6   Title  Patient will increase dynamic gait index score to >19/24 as to demonstrate reduced fall risk and improved dynamic gait balance for better safety with community/home ambulation.    Baseline  10/13: 9/24    Time  8    Period  Weeks    Status  New    Target Date  12/24/18            Plan - 12/04/18 0912    Clinical Impression Statement  Patient is more challenged with breathing this session, reports it is normal for her to be troubled in colder weather due to only having "one lung". Ambulation continues to challenge her long duration capacity at this time with her fatiguing quickly with prolonged mobility. The patient would benefit from further skilled PT intervention to maximize safety and mobility.    Personal Factors and Comorbidities  Age;Comorbidity 3+;Fitness;Past/Current Experience;Social Background;Time since onset of  injury/illness/exacerbation    Comorbidities  anemia, cancer, celiac disease, dyspnea, meningioma, osteoporosis, HLD    Examination-Activity Limitations  Bed Mobility;Bathing;Bend;Carry;Dressing;Hygiene/Grooming;Locomotion Level;Reach Overhead;Sit;Squat;Stairs;Stand;Toileting;Transfers    Examination-Participation Restrictions  Church;Cleaning;Community Activity;Driving;Interpersonal Relationship;Meal Prep;Laundry;Shop;Volunteer;Yard Work    [employer] Potential  Fair    Clinical Impairments Affecting Rehab Potential  (+) improvement made within last year, eagerness to participate (-) HEP compliance, safety awareness     PT Frequency  2x / week    PT Duration  8 weeks    PT Treatment/Interventions  ADLs/Self Care Home Management;Cryotherapy;Electrical Stimulation;Iontophoresis 32m/ml Dexamethasone;Moist Heat;Ultrasound;DME Instruction;Gait training;Stair training;Balance training;Therapeutic exercise;Therapeutic activities;Functional mobility training;Neuromuscular re-education;Patient/family education;Manual techniques;Passive range of motion;Energy conservation    PT Next Visit Plan  progress strength and stability    PT Home Exercise Plan  standing hip abduction, standing marches, standing hip extension, standing hamstring curls, mini squats; kitchen sink tandem stance and SLS    Consulted and Agree with Plan of Care  Patient       Patient will benefit from skilled therapeutic intervention in order to improve the following deficits and impairments:  Abnormal gait, Decreased activity tolerance, Decreased balance, Decreased coordination, Decreased endurance, Decreased mobility, Decreased range of motion, Decreased safety awareness, Decreased strength, Difficulty walking, Impaired flexibility, Impaired perceived functional ability, Improper body mechanics, Postural dysfunction, Pain, Impaired UE functional use, Decreased knowledge of precautions, Cardiopulmonary status limiting activity, Decreased  knowledge of use of DME  Visit Diagnosis: Muscle weakness (generalized)  Unsteadiness on feet  Neurologic gait disorder     Problem List Patient  Active Problem List   Diagnosis Date Noted  . Cognitive deficits   . Labile blood pressure   . Acute blood loss anemia   . Slow transit constipation   . Vascular headache   . 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 Hydrocephalus ([place]) 07/03/2017  . 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 Pelvic fracture ([place]) 04/18/2016  . CD (celiac disease) 08/04/2014  . Cancer of upper lobe of right lung ([place]) 08/04/2014  . Age related osteoporosis 11/26/2013  . Absolute anemia 05/21/2013  . H/O malignant neoplasm 05/21/2013  . HLD (hyperlipidemia) 05/21/2013   Janna Arch, PT, DPT   12/04/2018, 9:30 AM  [place] MAIN [facility] SERVICES [address] [place], [place], [address] Phone: [phone]   Fax:  [phone]  Name: JARYIAH MEHLMAN MRN: [id number] Date of Birth: 1945/06/09

## 2018-12-09 ENCOUNTER — Ambulatory Visit: Payer: PPO

## 2018-12-09 ENCOUNTER — Other Ambulatory Visit: Payer: Self-pay

## 2018-12-09 DIAGNOSIS — M6281 Muscle weakness (generalized): Secondary | ICD-10-CM

## 2018-12-09 DIAGNOSIS — R269 Unspecified abnormalities of gait and mobility: Secondary | ICD-10-CM

## 2018-12-09 DIAGNOSIS — R2681 Unsteadiness on feet: Secondary | ICD-10-CM

## 2018-12-09 NOTE — Therapy (Signed)
[place] MAIN [facility] SERVICES [address] [place], [place], [address] Phone: [phone]   Fax:  [phone]  Physical Therapy Treatment  Patient Details  Name: Krista Evans MRN: [id number] Date of Birth: 1945-03-22 No data recorded  Encounter Date: 12/09/2018  PT End of Session - 12/09/18 0918    Visit Number  39    Number of Visits  46    Date for PT Re-Evaluation  12/24/18    Authorization Type  9/10 PN 10/29/18    PT Start Time  0845    PT Stop Time  0929    PT Time Calculation (min)  44 min    Equipment Utilized During Treatment  Gait belt    Activity Tolerance  Patient tolerated treatment well;Patient limited by fatigue       Past Medical History:  Diagnosis Date  . Abnormal Q waves on electrocardiogram   . Anemia   . Aortic atherosclerosis ([place])    "I could possibly have it, my grandmother had it"  . Cancer [facility]) 05/2011   Right upper Lung CA with partial Lobectomy.  . Celiac disease   . Celiac disease   . Dyspnea    with exertion  . Hyperlipidemia   . Hyperlipidemia   . Lung mass   . Meningioma ([place])   . Osteoporosis   . Personal history of chemotherapy   . Personal history of radiation therapy     Past Surgical History:  Procedure Laterality Date  . LUNG LOBECTOMY     right lung  . 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  Subjective Assessment - 12/09/18 0914    Subjective  Patient reports her breathing is more challenging today but is eager to participate in PT. No falls or LOB since last session.    Pertinent History  Patient is returning to outpatient physical therapy after closure for COVID. Due to patient's last session being in March, patient is being re-evaluated today. PMH includes anemia, aortic  atherosclerosis, cancer, celiac disease, dyspnea, HLD, meningioma, osteoporosis, history of chemo/radiation therapy, ventriculoperitoneal shunt (07/03/17).    Limitations  Lifting;Standing;Walking;House hold activities;Other (comment)    How long can you sit comfortably?  n/a    How long can you stand comfortably?  5 minutes    How long can you walk comfortably?  400 ft before fatigue/out of breathe.    Patient Stated Goals  Patient wants to be able to step up onto a curb without holding on    Currently in Pain?  No/denies      Neuro re-ed Step over and back orange hurdle 10x each LE, BUE support, into single UE support for 3 second holds  airex pad: modified tandem stance one foot on each color square, 2x 20 second holds  Balloon taps with SUE support reaching with affected LUE inside/outside BOS x 3 minutes   TherEx:      Quantum leg press: bilateral LE #125; 12x each LE, 2 sets, rainbow ball between knees for alignment and proper muscle recruitment. Cueing to exhale with pressing of legs.      Quantum leg press, single LE; #60  15x each LE, 2 sets. Cueing for neutral alignment and muscle recruitment.   5 lb bar: -seated row 12x -chest press 12x -overhead press 12x  Ambulate in hallway >1100 ft  with no UE support one near LOB when stopping ambulation requiring assistance to maintain stability; no standing rest breaks required Improved gait mechanics and arm swing. ; CGA with cueing for direction and posture. one near LOB with sudden stop. Sp02 93% at end of walk  Seated hamstring stretch 30 seconds each LE, improved ambulation s/p prolonged hold.      Pt educated throughout session about proper posture and technique with exercises. Improved exercise technique, movement at target joints, use of target muscles after min to mod verbal, visual, tactile cues.   .                        PT Education - 12/09/18 0916    Education provided  Yes    Education Details   exercise technique, breathing with ambulation, body mechanics    Person(s) Educated  Patient    Methods  Demonstration;Explanation;Tactile cues;Verbal cues    Comprehension  Verbalized understanding;Returned demonstration;Verbal cues required;Tactile cues required       PT Short Term Goals - 10/29/18 1059      PT SHORT TERM GOAL #1   Title  Patient will be independent in HEP demonstrating successful between session carryover and improve functional mobility and ease of completing ADLs.    Baseline  6/30: HEP given next session 7/29: HEP compliant    Time  2    Period  Weeks    Status  Achieved    Target Date  07/30/18      PT SHORT TERM GOAL #2   Title  Patient will be able to transfer sit<>Stand from regular height chair without pushing on arm rests to exhibit improved independence improved functional strength.     Baseline  6/30: requires excessive BUE support 7/20 : able to perform    Time  2    Period  Weeks    Status  Achieved    Target Date  07/30/18        PT Long Term Goals - 10/29/18 1059      PT LONG TERM GOAL #1   Title  Patient will increase six minute walk test distance to >1000 for progression to community ambulator and improve gait ability    Baseline  6/30: 880 ft with rollator 7/29: 882 ft with 3 minutes w/o rollator 8/19: 880 ft without AD 10/13: 901 ft with no AD and one near LOB    Time  8    Period  Weeks    Status  Partially Met    Target Date  12/24/18      PT LONG TERM GOAL #2   Title  Patient (> 74 years old) will complete five times sit to stand test in < 15 seconds without UE support indicating an increased LE strength and improved balance.    Baseline  6/30: 16 seconds with excessive BUE support 7/29: 15 seconds with SUE support and one LOB 8/19: 14 seconds with SUE support. 10/13: 12 seconds SUE support, 32 seconds no UE support    Time  8    Period  Weeks    Status  Partially Met    Target Date  12/24/18      PT LONG TERM GOAL #3   Title   Patient will demonstrate an improved Berg Balance Score of >42/56 as to demonstrate improved balance with ADLs such as sitting/standing and transfer balance and reduced fall risk.     Baseline  6/30: 37/56 7/29: 39/56 8/19:  42/56    Time  8    Period  Weeks    Status  Achieved      PT LONG TERM GOAL #4   Title  Patient will improve LEFS to 54/80 to demonstrate improved fucnctional strenth and mobility and ease of indepedence    Baseline  6/30: 32/80 7/29: 39/80 8/19: 41/80  10/13: 42/80    Time  8    Period  Weeks    Status  Partially Met    Target Date  12/24/18      PT LONG TERM GOAL #5   Title  Patient will demonstrate an improved Berg Balance Score of >48/56 as to demonstrate improved balance with ADLs such as sitting/standing and transfer balance and reduced fall risk.    Baseline  8/19: 42/56 10/13; 45/80    Time  8    Period  Weeks    Status  Partially Met    Target Date  12/24/18      PT LONG TERM GOAL #6   Title  Patient will increase dynamic gait index score to >19/24 as to demonstrate reduced fall risk and improved dynamic gait balance for better safety with community/home ambulation.    Baseline  10/13: 9/24    Time  8    Period  Weeks    Status  New    Target Date  12/24/18            Plan - 12/09/18 3646    Clinical Impression Statement  Patient presents with excellent motivation despite limited breathing capacity this session. Patient is progressing with decreased UE support with standing on unstable surfaces and is able to find her COM easier with ambulation. LLE continues to challenge patient more than RLE at this time in regards to strength and coordination.  The patient would benefit from further skilled PT intervention to maximize safety and mobility    Personal Factors and Comorbidities  Age;Comorbidity 3+;Fitness;Past/Current Experience;Social Background;Time since onset of injury/illness/exacerbation    Comorbidities  anemia, cancer, celiac disease,  dyspnea, meningioma, osteoporosis, HLD    Examination-Activity Limitations  Bed Mobility;Bathing;Bend;Carry;Dressing;Hygiene/Grooming;Locomotion Level;Reach Overhead;Sit;Squat;Stairs;Stand;Toileting;Transfers    Examination-Participation Restrictions  Church;Cleaning;Community Activity;Driving;Interpersonal Relationship;Meal Prep;Laundry;Shop;Volunteer;Yard Work    [employer] Potential  Fair    Clinical Impairments Affecting Rehab Potential  (+) improvement made within last year, eagerness to participate (-) HEP compliance, safety awareness     PT Frequency  2x / week    PT Duration  8 weeks    PT Treatment/Interventions  ADLs/Self Care Home Management;Cryotherapy;Electrical Stimulation;Iontophoresis 60m/ml Dexamethasone;Moist Heat;Ultrasound;DME Instruction;Gait training;Stair training;Balance training;Therapeutic exercise;Therapeutic activities;Functional mobility training;Neuromuscular re-education;Patient/family education;Manual techniques;Passive range of motion;Energy conservation    PT Next Visit Plan  progress strength and stability    PT Home Exercise Plan  standing hip abduction, standing marches, standing hip extension, standing hamstring curls, mini squats; kitchen sink tandem stance and SLS    Consulted and Agree with Plan of Care  Patient       Patient will benefit from skilled therapeutic intervention in order to improve the following deficits and impairments:  Abnormal gait, Decreased activity tolerance, Decreased balance, Decreased coordination, Decreased endurance, Decreased mobility, Decreased range of motion, Decreased safety awareness, Decreased strength, Difficulty walking, Impaired flexibility, Impaired perceived functional ability, Improper body mechanics, Postural dysfunction, Pain, Impaired UE functional use, Decreased knowledge of precautions, Cardiopulmonary status limiting activity, Decreased knowledge of use of DME  Visit Diagnosis: Muscle weakness  (generalized)  Unsteadiness on feet  Neurologic  gait disorder     Problem List Patient Active Problem List   Diagnosis Date Noted  . Cognitive deficits   . Labile blood pressure   . Acute blood loss anemia   . Slow transit constipation   . Vascular headache   . 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 Hydrocephalus ([place]) 07/03/2017  . 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 Pelvic fracture ([place]) 04/18/2016  . CD (celiac disease) 08/04/2014  . Cancer of upper lobe of right lung ([place]) 08/04/2014  . Age related osteoporosis 11/26/2013  . Absolute anemia 05/21/2013  . H/O malignant neoplasm 05/21/2013  . HLD (hyperlipidemia) 05/21/2013   Janna Arch, PT, DPT   12/09/2018, 9:29 AM  [place] MAIN [facility] SERVICES [address] [place], [place], [address] Phone: [phone]   Fax:  [phone]  Name: WAYNETTE TOWERS MRN: [id number] Date of Birth: September 08, 1945

## 2018-12-11 ENCOUNTER — Ambulatory Visit: Payer: PPO

## 2018-12-11 ENCOUNTER — Other Ambulatory Visit: Payer: Self-pay

## 2018-12-11 DIAGNOSIS — R269 Unspecified abnormalities of gait and mobility: Secondary | ICD-10-CM

## 2018-12-11 DIAGNOSIS — M6281 Muscle weakness (generalized): Secondary | ICD-10-CM

## 2018-12-11 DIAGNOSIS — R2681 Unsteadiness on feet: Secondary | ICD-10-CM

## 2018-12-11 NOTE — Therapy (Signed)
[place] MAIN [facility] SERVICES [address] [place], [place], [address] Phone: [phone]   Fax:  [phone]  Physical Therapy Treatment Physical Therapy Progress Note   Dates of reporting period  10/29/18   to   12/11/18  Patient Details  Name: Krista Evans MRN: [id number] Date of Birth: 1945-01-30 No data recorded  Encounter Date: 12/11/2018  PT End of Session - 12/11/18 0909    Visit Number  40    Number of Visits  46    Date for PT Re-Evaluation  12/24/18    Authorization Type  10/10 PN 10/29/18; next session 1/10 PN 12/11/18    PT Start Time  0845    PT Stop Time  0929    PT Time Calculation (min)  44 min    Equipment Utilized During Treatment  Gait belt    Activity Tolerance  Patient tolerated treatment well;Patient limited by fatigue       Past Medical History:  Diagnosis Date  . Abnormal Q waves on electrocardiogram   . Anemia   . Aortic atherosclerosis ([place])    "I could possibly have it, my grandmother had it"  . Cancer [facility]) 05/2011   Right upper Lung CA with partial Lobectomy.  . Celiac disease   . Celiac disease   . Dyspnea    with exertion  . Hyperlipidemia   . Hyperlipidemia   . Lung mass   . Meningioma ([place])   . Osteoporosis   . Personal history of chemotherapy   . Personal history of radiation therapy     Past Surgical History:  Procedure Laterality Date  . LUNG LOBECTOMY     right lung  . 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  Subjective Assessment - 12/11/18 1110    Subjective  Patient reports she hasn't been able to walk outside due to the colder weather. Has been doing her inside exercises.    Pertinent History  Patient is returning to outpatient physical therapy after closure for COVID. Due to  patient's last session being in March, patient is being re-evaluated today. PMH includes anemia, aortic atherosclerosis, cancer, celiac disease, dyspnea, HLD, meningioma, osteoporosis, history of chemo/radiation therapy, ventriculoperitoneal shunt (07/03/17).    Limitations  Lifting;Standing;Walking;House hold activities;Other (comment)    How long can you sit comfortably?  n/a    How long can you stand comfortably?  5 minutes    How long can you walk comfortably?  400 ft before fatigue/out of breathe.    Patient Stated Goals  Patient wants to be able to step up onto a curb without holding on    Currently in Pain?  No/denies         [facility] PT Assessment - 12/11/18 0001      Standardized Balance Assessment   Standardized Balance Assessment  Berg Balance Test;Dynamic Gait Index      Berg Balance Test   Sit to Stand  Able to stand without using hands and stabilize independently    Standing Unsupported  Able to stand safely 2 minutes    Sitting with Back Unsupported but Feet Supported on Floor or Stool  Able to sit safely and securely 2 minutes    Stand to Sit  Sits safely with minimal use of hands    Transfers  Able to transfer  safely, definite need of hands    Standing Unsupported with Eyes Closed  Able to stand 10 seconds with supervision    Standing Unsupported with Feet Together  Able to place feet together independently and stand for 1 minute with supervision    From Standing, Reach Forward with Outstretched Arm  Can reach forward >12 cm safely (5")    From Standing Position, Pick up Object from [place] to pick up shoe safely and easily    From Standing Position, Turn to Look Behind Over each Shoulder  Looks behind from both sides and weight shifts well    Turn 360 Degrees  Able to turn 360 degrees safely but slowly    Standing Unsupported, Alternately Place Feet on Step/Stool  Able to complete >2 steps/needs minimal assist    Standing Unsupported, One Foot in Front  Able to take  small step independently and hold 30 seconds    Standing on One Leg  Tries to lift leg/unable to hold 3 seconds but remains standing independently    Total Score  42      Dynamic Gait Index   Level Surface  Mild Impairment    Change in Gait Speed  Mild Impairment    Gait with Horizontal Head Turns  Mild Impairment    Gait with Vertical Head Turns  Mild Impairment    Gait and Pivot Turn  Moderate Impairment    Step Over Obstacle  Severe Impairment    Step Around Obstacles  Moderate Impairment    Steps  Moderate Impairment    Total Score  11      Goals 6 minute walk test: 675 ft with SUE support  5x STS: 12 seconds SUE support ; 26 seconds with no UE support (1x with UE support due to eccentric fall back into chair).  BERG: 42/56  LEFS: 37/80  DGI: 11/24     Patient's condition has the potential to improve in response to therapy. Maximum improvement is yet to be obtained. The anticipated improvement is attainable and reasonable in a generally predictable time.  Patient reports she is improving in her balance but continues to challenged with not holding on. She is challenged with lifting her left leg off the ground.     BERG decreased in score however DGI increased to 11 points with patient demonstrating improved stability with head turns. Patient had decreased capacity for ambulation with slower 6 minute walk test only ambulating 675 ft. Patient became tearful after realizing she had scored lower in multiple outcome measures. Patient's condition has the potential to improve in response to therapy. Maximum improvement is yet to be obtained. The anticipated improvement is attainable and reasonable in a generally predictable time.                PT Education - 12/11/18 1111    Education provided  Yes    Education Details  exercise technique, goals, need for doing STS transfer and walking within home    Person(s) Educated  Patient    Methods   Explanation;Demonstration;Tactile cues;Verbal cues    Comprehension  Verbalized understanding;Returned demonstration;Verbal cues required;Tactile cues required       PT Short Term Goals - 10/29/18 1059      PT SHORT TERM GOAL #1   Title  Patient will be independent in HEP demonstrating successful between session carryover and improve functional mobility and ease of completing ADLs.    Baseline  6/30: HEP given next session 7/29: HEP compliant  Time  2    Period  Weeks    Status  Achieved    Target Date  07/30/18      PT SHORT TERM GOAL #2   Title  Patient will be able to transfer sit<>Stand from regular height chair without pushing on arm rests to exhibit improved independence improved functional strength.     Baseline  6/30: requires excessive BUE support 7/20 : able to perform    Time  2    Period  Weeks    Status  Achieved    Target Date  07/30/18        PT Long Term Goals - 12/11/18 0910      PT LONG TERM GOAL #1   Title  Patient will increase six minute walk test distance to >1000 for progression to community ambulator and improve gait ability    Baseline  6/30: 880 ft with rollator 7/29: 882 ft with 3 minutes w/o rollator 8/19: 880 ft without AD 10/13: 901 ft with no AD and one near LOB 11/25: 675 ft with SUE support    Time  8    Period  Weeks    Status  On-going    Target Date  12/24/18      PT LONG TERM GOAL #2   Title  Patient (> 27 years old) will complete five times sit to stand test in < 15 seconds without UE support indicating an increased LE strength and improved balance.    Baseline  6/30: 16 seconds with excessive BUE support 7/29: 15 seconds with SUE support and one LOB 8/19: 14 seconds with SUE support. 10/13: 12 seconds SUE support, 32 seconds no UE support 11/25: 12 seconds SUE support, 26 seconds no UE support (one LOB with one use of SUE)    Time  8    Period  Weeks    Status  Partially Met    Target Date  01/13/19      PT LONG TERM GOAL #3    Title  Patient will demonstrate an improved Berg Balance Score of >42/56 as to demonstrate improved balance with ADLs such as sitting/standing and transfer balance and reduced fall risk.     Baseline  6/30: 37/56 7/29: 39/56 8/19:  42/56    Time  8    Period  Weeks    Status  Achieved      PT LONG TERM GOAL #4   Title  Patient will improve LEFS to 54/80 to demonstrate improved fucnctional strenth and mobility and ease of indepedence    Baseline  6/30: 32/80 7/29: 39/80 8/19: 41/80  10/13: 42/80 11/25: 37/80    Time  8    Period  Weeks    Status  Partially Met    Target Date  12/24/18      PT LONG TERM GOAL #5   Title  Patient will demonstrate an improved Berg Balance Score of >48/56 as to demonstrate improved balance with ADLs such as sitting/standing and transfer balance and reduced fall risk.    Baseline  8/19: 42/56 10/13; 45/80 11/25: 42/56    Time  8    Period  Weeks    Status  On-going    Target Date  12/24/18      PT LONG TERM GOAL #6   Title  Patient will increase dynamic gait index score to >19/24 as to demonstrate reduced fall risk and improved dynamic gait balance for better safety with community/home ambulation.    Baseline  10/13: 9/24 11/25: 11/24    Time  8    Period  Weeks    Status  Partially Met    Target Date  12/24/18            Plan - 12/11/18 1111    Clinical Impression Statement  BERG decreased in score however DGI increased to 11 points with patient demonstrating improved stability with head turns. Patient had decreased capacity for ambulation with slower 6 minute walk test only ambulating 675 ft. Patient became tearful after realizing she had scored lower in multiple outcome measures. Patient's condition has the potential to improve in response to therapy. Maximum improvement is yet to be obtained. The anticipated improvement is attainable and reasonable in a generally predictable time.    Personal Factors and Comorbidities  Age;Comorbidity  3+;Fitness;Past/Current Experience;Social Background;Time since onset of injury/illness/exacerbation    Comorbidities  anemia, cancer, celiac disease, dyspnea, meningioma, osteoporosis, HLD    Examination-Activity Limitations  Bed Mobility;Bathing;Bend;Carry;Dressing;Hygiene/Grooming;Locomotion Level;Reach Overhead;Sit;Squat;Stairs;Stand;Toileting;Transfers    Examination-Participation Restrictions  Church;Cleaning;Community Activity;Driving;Interpersonal Relationship;Meal Prep;Laundry;Shop;Volunteer;Yard Work    [employer] Potential  Fair    Clinical Impairments Affecting Rehab Potential  (+) improvement made within last year, eagerness to participate (-) HEP compliance, safety awareness     PT Frequency  2x / week    PT Duration  8 weeks    PT Treatment/Interventions  ADLs/Self Care Home Management;Cryotherapy;Electrical Stimulation;Iontophoresis 49m/ml Dexamethasone;Moist Heat;Ultrasound;DME Instruction;Gait training;Stair training;Balance training;Therapeutic exercise;Therapeutic activities;Functional mobility training;Neuromuscular re-education;Patient/family education;Manual techniques;Passive range of motion;Energy conservation    PT Next Visit Plan  progress strength and stability    PT Home Exercise Plan  standing hip abduction, standing marches, standing hip extension, standing hamstring curls, mini squats; kitchen sink tandem stance and SLS    Consulted and Agree with Plan of Care  Patient       Patient will benefit from skilled therapeutic intervention in order to improve the following deficits and impairments:  Abnormal gait, Decreased activity tolerance, Decreased balance, Decreased coordination, Decreased endurance, Decreased mobility, Decreased range of motion, Decreased safety awareness, Decreased strength, Difficulty walking, Impaired flexibility, Impaired perceived functional ability, Improper body mechanics, Postural dysfunction, Pain, Impaired UE functional use, Decreased knowledge of  precautions, Cardiopulmonary status limiting activity, Decreased knowledge of use of DME  Visit Diagnosis: Muscle weakness (generalized)  Unsteadiness on feet  Neurologic gait disorder     Problem List Patient Active Problem List   Diagnosis Date Noted  . Cognitive deficits   . Labile blood pressure   . Acute blood loss anemia   . Slow transit constipation   . Vascular headache   . 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 [place] ([place] 07/03/[place]  . Communicating hydrocephalus ([place] 07/03/2017  . Pelvic fracture ([place] 04/18/2016  . CD (celiac disease) 08/04/2014  . [place] 08/04/2014  . Age related osteoporosis 11/26/2013  . Absolute anemia 05/21/2013  . H/O malignant neoplasm 05/21/2013  . HLD (hyperlipidemia) 05/21/2013   MJanna Arch PT, DPT   12/11/2018, 11:13 AM  [place] [facility] [address] [address]Phone: [phone]  Fax:  [phone] Name: MDALAL LIVENGOODMRN: [id number] of Birth: [birth year]

## 2018-12-16 ENCOUNTER — Ambulatory Visit: Payer: PPO

## 2018-12-18 ENCOUNTER — Ambulatory Visit: Payer: PPO

## 2018-12-23 ENCOUNTER — Ambulatory Visit: Payer: PPO

## 2018-12-24 DIAGNOSIS — E7849 Other hyperlipidemia: Secondary | ICD-10-CM | POA: Diagnosis not present

## 2018-12-24 DIAGNOSIS — K900 Celiac disease: Secondary | ICD-10-CM | POA: Diagnosis not present

## 2018-12-24 DIAGNOSIS — D649 Anemia, unspecified: Secondary | ICD-10-CM | POA: Diagnosis not present

## 2018-12-24 DIAGNOSIS — M810 Age-related osteoporosis without current pathological fracture: Secondary | ICD-10-CM | POA: Diagnosis not present

## 2018-12-26 ENCOUNTER — Ambulatory Visit: Payer: PPO

## 2018-12-31 ENCOUNTER — Ambulatory Visit: Payer: PPO

## 2019-01-02 ENCOUNTER — Ambulatory Visit: Payer: PPO

## 2019-01-07 ENCOUNTER — Ambulatory Visit: Payer: PPO

## 2019-01-14 ENCOUNTER — Ambulatory Visit: Payer: PPO

## 2019-01-16 ENCOUNTER — Ambulatory Visit: Payer: PPO

## 2019-01-20 ENCOUNTER — Ambulatory Visit: Payer: PPO

## 2019-01-22 ENCOUNTER — Ambulatory Visit: Payer: PPO

## 2019-01-27 ENCOUNTER — Ambulatory Visit: Payer: PPO

## 2019-01-29 ENCOUNTER — Ambulatory Visit: Payer: PPO

## 2019-02-03 ENCOUNTER — Ambulatory Visit: Payer: PPO

## 2019-02-05 ENCOUNTER — Ambulatory Visit: Payer: PPO

## 2019-02-10 ENCOUNTER — Ambulatory Visit: Payer: PPO

## 2019-02-11 DIAGNOSIS — J069 Acute upper respiratory infection, unspecified: Secondary | ICD-10-CM | POA: Diagnosis not present

## 2019-02-11 DIAGNOSIS — J019 Acute sinusitis, unspecified: Secondary | ICD-10-CM | POA: Diagnosis not present

## 2019-02-12 ENCOUNTER — Ambulatory Visit: Payer: PPO

## 2019-05-23 ENCOUNTER — Ambulatory Visit: Payer: PPO

## 2019-05-30 ENCOUNTER — Ambulatory Visit: Payer: PPO | Attending: Internal Medicine

## 2019-05-30 DIAGNOSIS — Z23 Encounter for immunization: Secondary | ICD-10-CM

## 2019-05-30 NOTE — Progress Notes (Signed)
   Covid-19 Vaccination Clinic  Name:  Krista Evans    MRN: [id number] DOB: 10-Oct-1945  05/30/2019  [name] was observed post Covid-19 immunization for 15 minutes without incident. She was provided with Vaccine Information Sheet and instruction to access the V-Safe system.   [name] was instructed to call 911 with any severe reactions post vaccine: Marland Kitchen Difficulty breathing  . Swelling of face and throat  . A fast heartbeat  . A bad rash all over body  . Dizziness and weakness   Immunizations Administered    Name Date Dose VIS Date Route   Pfizer COVID-19 Vaccine 05/30/2019 10:25 AM 0.3 mL 03/12/2018 Intramuscular   Manufacturer: [place]   Lot: [id number]   [place]: [id number]

## 2019-06-24 ENCOUNTER — Ambulatory Visit: Payer: PPO | Attending: Internal Medicine

## 2019-06-24 DIAGNOSIS — Z23 Encounter for immunization: Secondary | ICD-10-CM

## 2019-06-24 NOTE — Progress Notes (Signed)
   Covid-19 Vaccination Clinic  Name:  Krista Evans    MRN: [id number] DOB: 11/18/1945  06/24/2019  Krista Evans was observed post Covid-19 immunization for 15 minutes without incident. She was provided with Vaccine Information Sheet and instruction to access the V-Safe system.   Krista Evans was instructed to call 911 with any severe reactions post vaccine: Marland Kitchen Difficulty breathing  . Swelling of face and throat  . A fast heartbeat  . A bad rash all over body  . Dizziness and weakness   Immunizations Administered    Name Date Dose VIS Date Route   Pfizer COVID-19 Vaccine 06/24/2019  9:58 AM 0.3 mL 03/12/2018 Intramuscular   Manufacturer: [place]   Lot: [id number]   [place]: [id number]

## 2019-08-01 DIAGNOSIS — D329 Benign neoplasm of meninges, unspecified: Secondary | ICD-10-CM | POA: Diagnosis not present

## 2019-08-01 DIAGNOSIS — D649 Anemia, unspecified: Secondary | ICD-10-CM | POA: Diagnosis not present

## 2019-08-01 DIAGNOSIS — C3490 Malignant neoplasm of unspecified part of unspecified bronchus or lung: Secondary | ICD-10-CM | POA: Diagnosis not present

## 2019-08-01 DIAGNOSIS — Z1231 Encounter for screening mammogram for malignant neoplasm of breast: Secondary | ICD-10-CM | POA: Diagnosis not present

## 2019-08-01 DIAGNOSIS — Z0000 Encounter for general adult medical examination without abnormal findings: Secondary | ICD-10-CM | POA: Diagnosis not present

## 2019-08-01 DIAGNOSIS — E441 Mild protein-calorie malnutrition: Secondary | ICD-10-CM | POA: Diagnosis not present

## 2019-08-01 DIAGNOSIS — K900 Celiac disease: Secondary | ICD-10-CM | POA: Diagnosis not present

## 2019-08-01 DIAGNOSIS — E7849 Other hyperlipidemia: Secondary | ICD-10-CM | POA: Diagnosis not present

## 2019-08-01 DIAGNOSIS — I700 Atherosclerosis of aorta: Secondary | ICD-10-CM | POA: Diagnosis not present

## 2019-08-01 DIAGNOSIS — J439 Emphysema, unspecified: Secondary | ICD-10-CM | POA: Diagnosis not present

## 2019-08-01 DIAGNOSIS — M810 Age-related osteoporosis without current pathological fracture: Secondary | ICD-10-CM | POA: Diagnosis not present

## 2019-08-01 DIAGNOSIS — Z85118 Personal history of other malignant neoplasm of bronchus and lung: Secondary | ICD-10-CM | POA: Diagnosis not present

## 2019-08-01 DIAGNOSIS — Z7409 Other reduced mobility: Secondary | ICD-10-CM | POA: Diagnosis not present

## 2019-08-01 DIAGNOSIS — G912 (Idiopathic) normal pressure hydrocephalus: Secondary | ICD-10-CM | POA: Diagnosis not present

## 2019-08-04 ENCOUNTER — Other Ambulatory Visit: Payer: Self-pay | Admitting: Internal Medicine

## 2019-08-04 DIAGNOSIS — Z1231 Encounter for screening mammogram for malignant neoplasm of breast: Secondary | ICD-10-CM

## 2019-08-25 ENCOUNTER — Ambulatory Visit
Admission: RE | Admit: 2019-08-25 | Discharge: 2019-08-25 | Disposition: A | Discharge status: Home / Self Care | Payer: PPO | Source: Ambulatory Visit | Attending: Internal Medicine | Admitting: Internal Medicine

## 2019-08-25 ENCOUNTER — Other Ambulatory Visit: Payer: Self-pay

## 2019-08-25 DIAGNOSIS — Z1231 Encounter for screening mammogram for malignant neoplasm of breast: Secondary | ICD-10-CM | POA: Diagnosis not present

## 2019-09-23 ENCOUNTER — Ambulatory Visit: Payer: PPO

## 2019-09-25 ENCOUNTER — Ambulatory Visit: Payer: PPO

## 2019-09-29 ENCOUNTER — Ambulatory Visit: Payer: PPO

## 2019-10-01 ENCOUNTER — Ambulatory Visit: Payer: PPO

## 2019-10-06 ENCOUNTER — Ambulatory Visit: Payer: PPO

## 2019-10-08 ENCOUNTER — Ambulatory Visit: Payer: PPO

## 2019-10-13 ENCOUNTER — Ambulatory Visit: Payer: PPO

## 2019-10-13 DIAGNOSIS — I700 Atherosclerosis of aorta: Secondary | ICD-10-CM | POA: Diagnosis not present

## 2019-10-13 DIAGNOSIS — K900 Celiac disease: Secondary | ICD-10-CM | POA: Diagnosis not present

## 2019-10-13 DIAGNOSIS — D649 Anemia, unspecified: Secondary | ICD-10-CM | POA: Diagnosis not present

## 2019-10-13 DIAGNOSIS — E7849 Other hyperlipidemia: Secondary | ICD-10-CM | POA: Diagnosis not present

## 2019-10-13 DIAGNOSIS — D329 Benign neoplasm of meninges, unspecified: Secondary | ICD-10-CM | POA: Diagnosis not present

## 2019-10-13 DIAGNOSIS — E441 Mild protein-calorie malnutrition: Secondary | ICD-10-CM | POA: Diagnosis not present

## 2019-10-13 DIAGNOSIS — Z23 Encounter for immunization: Secondary | ICD-10-CM | POA: Diagnosis not present

## 2019-10-13 DIAGNOSIS — M810 Age-related osteoporosis without current pathological fracture: Secondary | ICD-10-CM | POA: Diagnosis not present

## 2019-10-13 DIAGNOSIS — G912 (Idiopathic) normal pressure hydrocephalus: Secondary | ICD-10-CM | POA: Diagnosis not present

## 2019-10-14 ENCOUNTER — Other Ambulatory Visit: Payer: Self-pay | Admitting: Internal Medicine

## 2019-10-14 DIAGNOSIS — D329 Benign neoplasm of meninges, unspecified: Secondary | ICD-10-CM

## 2019-10-14 DIAGNOSIS — G912 (Idiopathic) normal pressure hydrocephalus: Secondary | ICD-10-CM

## 2019-10-15 ENCOUNTER — Ambulatory Visit: Payer: PPO

## 2019-10-20 ENCOUNTER — Ambulatory Visit: Payer: PPO

## 2019-10-20 ENCOUNTER — Other Ambulatory Visit: Payer: Self-pay

## 2019-10-20 ENCOUNTER — Ambulatory Visit: Payer: PPO | Attending: Internal Medicine

## 2019-10-20 DIAGNOSIS — R269 Unspecified abnormalities of gait and mobility: Secondary | ICD-10-CM | POA: Diagnosis not present

## 2019-10-20 DIAGNOSIS — M6281 Muscle weakness (generalized): Secondary | ICD-10-CM | POA: Diagnosis not present

## 2019-10-20 DIAGNOSIS — R2681 Unsteadiness on feet: Secondary | ICD-10-CM | POA: Diagnosis not present

## 2019-10-20 NOTE — Therapy (Signed)
[place] MAIN [facility] SERVICES [address] [place], [place], [address] Phone: [phone]   Fax:  [phone]  Physical Therapy Evaluation  Patient Details  Name: Krista Evans MRN: [id number] Date of Birth: [birth year] Referring Provider (PT): Ramonita Lab   Encounter Date: 10/20/2019   PT End of Session - 10/20/19 1446    Visit Number 1    Number of Visits 17    Date for PT Re-Evaluation 12/15/19    Authorization Type 1/10 eval 10/20/19    PT Start Time 1400    PT Stop Time 1459    PT Time Calculation (min) 59 min    Equipment Utilized During Treatment Gait belt    Activity Tolerance Patient tolerated treatment well;Patient limited by fatigue    Behavior During Therapy [facility] for tasks assessed/performed           Past Medical History:  Diagnosis Date  . Abnormal Q waves on electrocardiogram   . Anemia   . Aortic atherosclerosis ([place])    "I could possibly have it, my grandmother had it"  . Cancer [facility]) 05/2011   Right upper Lung CA with partial Lobectomy.  . Celiac disease   . Celiac disease   . Dyspnea    with exertion  . Hyperlipidemia   . Hyperlipidemia   . Lung mass   . Meningioma ([place])   . Osteoporosis   . Personal history of chemotherapy   . Personal history of radiation therapy     Past Surgical History:  Procedure Laterality Date  . LUNG LOBECTOMY     right lung  . 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 Patient notes not being as active since previous discharge from outpatient skilled PT due to fear of falling. Patient states  that husband has been having cardiac issues recently which has hindered his caregiver duties with her. Patient is more hesitant to go out as to not burden husband. Patient denies any falls in past 6 months, with "multiple" near falls in home.    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 Patient has attended OP PT in the past year for LLE strengthening and balance with positive results, but has not been in attendance recently due to COVID-19 pandemic. PMH includes osteoporosis, anemia, celiac disease, hyperlipidemia, hx of R upper lobe mass with resection 6/13, hx of pneumonia, meningioma, major atherosclerosis. VP shunt has been placed 06/2017.    Limitations Lifting;House hold activities;Standing;Walking    How long can you sit comfortably? n/a    How long can you stand comfortably? ~10 minutes (AM better than PM)    How long can you walk comfortably? a few mins until standing rest break with LLE off ground    Patient Stated Goals "to walk by myself"    Currently in Pain? No/denies              [facility] PT Assessment - 10/20/19 0001      Assessment   Medical Diagnosis L sided weakness     Referring Provider (PT) Ramonita Lab    Hand  Dominance Left    Prior Therapy Yes      Precautions   Precautions Fall      Restrictions   Weight Bearing Restrictions No      Balance Screen   Has the patient fallen in the past 6 months No    How many times? --   multiple near falls in past 6 months, cannot remember exact   Has the patient had a decrease in activity level because of a fear of falling?  Yes    Is the patient reluctant to leave their home because of a fear of falling?  Yes      [place] residence    Living Arrangements Spouse/significant other    Available Help at Discharge Family    Type of [place] to enter    Entrance Stairs-Number of Steps 3     [place] One level    [place] - 4 wheels;Wheelchair - manual;Cane - single point;Grab bars - tub/shower;Toilet riser;Shower seat - built in      Prior Function   Level of Independence Independent with basic ADLs    Vocation Retired    Leisure read, watch TV      Cognition   Overall Cognitive Status Within Functional Limits for tasks assessed      Standardized Balance Assessment   Standardized Balance Assessment Berg Balance Test      Berg Balance Test   Sit to Stand Able to stand  independently using hands    Standing Unsupported Able to stand 2 minutes with supervision    Sitting with Back Unsupported but Feet Supported on Floor or Stool Able to sit safely and securely 2 minutes    Stand to Sit Controls descent by using hands    Transfers Able to transfer safely, definite need of hands    Standing Unsupported with Eyes Closed Able to stand 10 seconds with supervision    Standing Unsupported with Feet Together Able to place feet together independently but unable to hold for 30 seconds    From Standing, Reach Forward with Outstretched Arm Reaches forward but needs supervision    From Standing Position, Pick up Object from Floor Unable to try/needs assist to keep balance    From Standing Position, Turn to Look Behind Over each Shoulder Turn sideways only but maintains balance    Turn 360 Degrees Needs close supervision or verbal cueing    Standing Unsupported, Alternately Place Feet on Step/Stool Able to complete >2 steps/needs minimal assist    Standing Unsupported, One Foot in Front Able to take small step independently and hold 30 seconds    Standing on One Leg Unable to try or needs assist to prevent fall    Total Score 28            PAIN: Patient denies pain at this time. Notes L thigh soreness after prolonged walking that subsides after a few minutes of rest. POSTURE:  Patient presents with R lean in sitting with minor thoracic  kyphosis and rounded shoulders. Upon standing, patient demonstrates anteriorly rotated pelvis with rounded shoulders. No scoliotic curve noted in sitting or standing. ROM:  All BUE/BLE AROM WFL. L shoulder AROM flexion/abduction WFL but less than RUE. L grip strength decreases quickly during assessment. Hamstring and quadricep flexibility not assessed this session due to time constrictions, but suspect shortened quads indicated by gait trials. See  gait section.    STRENGTH:   Graded on a 0-5 scale Muscle Group Left Right  Hip Flex 3-/5 4-/5  Knee Flex 3+/5 4/5  Knee Ext 4-/5 4-/5  Ankle DF 5/5 5/5  Ankle PF 5/5 5/5  Hip Abd 3-/5 3+/5  Hip Add 3+/5 4/5     SENSATION: WNL       NEUROLOGICAL SCREEN: (2+ unless otherwise noted.) N=normal  Ab=abnormal   Level Dermatome R L  L2 Medial thigh/groin N N  L3 Lower thigh/med.knee N N  L4 Medial leg/lat thigh N N  L5 Lat. leg & dorsal foot N N  S1 post/lat foot/thigh/leg N N  S2 Post./med. thigh & leg N N        SOMATOSENSORY:  Any N & T in extremities or weakness: reports :         Sensation           Intact      Diminished         Absent  Light touch BLEs                                COORDINATION: Finger to Nose: Dysmetric on L, slower speed than R. R performed slowly with good precision. Heel Slide Test: Dec speed, inc effort to perform LLE on R shin.     FUNCTIONAL MOBILITY: STS: Patient requires BUE support to go from sit <> stand. Patient able to clear ~2" off seat without UE support, but appears extremely fearful to rise without use of hands. Patient demonstrates weight shift over RLE during task. Inc time to perform task due to fearful nature of patient.   BALANCE:     Dynamic Sitting Balance  Normal Able to sit unsupported and weight shift across midline maximally    Good Able to sit unsupported and weight shift across midline moderately   Good-/Fair+ Able to sit unsupported and weight shift across midline  minimally  X  Fair Minimal weight shifting ipsilateral/front, difficulty crossing midline    Fair- Reach to ipsilateral side and unable to weight shift    Poor + Able to sit unsupported with min A and reach to ipsilateral side, unable to weight shift    Poor Able to sit unsupported with mod A and reach ipsilateral/front-can't cross midline            Standing Dynamic Balance  Normal Stand independently unsupported, able to weight shift and cross midline maximally    Good Stand independently unsupported, able to weight shift and cross midline moderately    Good-/Fair+ Stand independently unsupported, able to weight shift across midline minimally    Fair Stand independently unsupported, weight shift, and reach ipsilaterally, loss of balance when crossing midline X  Poor+ Able to stand with Min A and reach ipsilaterally, unable to weight shift    Poor Able to stand with Mod A and minimally reach ipsilaterally, unable to cross midline.                Static Sitting Balance  Normal Able to maintain balance against maximal resistance    Good Able to maintain balance against moderate resistance X  Good-/Fair+ Accepts minimal resistance    Fair Able to sit unsupported without balance loss and without UE support    Poor+ Able to maintain with Minimal assistance from individual or chair    Poor Unable to maintain balance-requires mod/max support from individual  or chair            Static Standing Balance  Normal Able to maintain standing balance against maximal resistance    Good Able to maintain standing balance against moderate resistance    Good-/Fair+ Able to maintain standing balance against minimal resistance  X  Fair Able to stand unsupported without UE support and without LOB for 1-2 min   Fair- Requires Min A and UE support to maintain standing without loss of balance    Poor+ Requires mod A and UE support to maintain standing without loss of balance    Poor Requires max A and UE  support to maintain standing balance without loss            GAIT: Patient ambulates with rollator during evaluation. Patient initiates with a shuffling gait pattern. Patient able to demonstrate minimal foot clearance bilaterally with sustained short step length. Patient ambulates with decreased hip flexion bilaterally. Increased time to complete turns with shuffling and pivoting. Anterior pelvic tilt noted throughout gait trials.     OUTCOME MEASURES: TEST OUTCOME INTERPRETATION  5 times sit<>stand 26.14 seconds >60 yo, >15 sec indicates increased risk for falls  10MWT Self-selected: 15.66sec = 0.57ms Fast: 12.59sec = 0.710m <1.0 m/s indicates increased risk for falls; limited community ambulator  Berg 28/56   ABC 36%   FOTO  32%            Objective measurements completed on examination: See above findings.               PT Education - 10/20/19 1444    Education provided Yes    Education Details role of therapy, goals, POC, body mechanics    Person(s) Educated Patient    Methods Explanation;Demonstration;Tactile cues;Verbal cues    Comprehension Verbalized understanding;Tactile cues required;Returned demonstration;Verbal cues required            PT Short Term Goals - 10/20/19 1646      PT SHORT TERM GOAL #1   Title Patient will be independent in HEP demonstrating successful between session carryover and improve functional mobility and ease of completing ADLs.    Baseline 10/20/19: HEP not administered at evaluation    Time 4    Period Weeks    Status New    Target Date 11/17/19      PT SHORT TERM GOAL #2   Title Patient will increase ABC scale score >45% to demonstrate better functional mobility and better confidence with ADLs.    Baseline 10/20/19: 36%    Time 4    Period Weeks    Status New    Target Date 11/17/19             PT Long Term Goals - 10/20/19 1649      PT LONG TERM GOAL #1   Title Patient will increase FOTO score to equal  to or greater than 49% to demonstrate statistically significant improvement in mobility and quality of life.    Baseline 10/20/19: 32%    Time 8    Period Weeks    Status New    Target Date 12/15/19      PT LONG TERM GOAL #2   Title Patient (> 6065ears old) will complete five times sit to stand test in < 15 seconds without UE support indicating an increased LE strength and improved balance.    Baseline 10/20/19: 26.14 seconds with BUE support required    Time 8    Period Weeks  Status New    Target Date 12/15/19      PT LONG TERM GOAL #3   Title Patient will demonstrate an improved Berg Balance Score of >42/56 with minimal need for BUE support as to demonstrate improved balance with ADLs such as sitting/standing and transfer balance and reduced fall risk.    Baseline 10/20/19: 28/56 with limitations due to fear of falling    Time 8    Period Weeks    Status New    Target Date 12/15/19      PT LONG TERM GOAL #4   Title Patient will increase 10 meter walk test to >1.80ms with improved foot clearance and increased hip flexion in swing phase as to improve gait speed for better community ambulation and to reduce fall risk.    Baseline 10/20/19: 0.647m with rollator, minimal hip flexion and foot clearance bilat    Time 8    Period Weeks    Status New    Target Date 12/15/19      PT LONG TERM GOAL #5   Title Patient will increase ABC scale score >60% to demonstrate better functional mobility and better confidence with ADLs.    Baseline 10/20/19: 36%    Time 8    Period Weeks    Status New    Target Date 12/15/19                  Plan - 10/20/19 1618    Clinical Impression Statement Patient is a pleasant [age]/o presenting with balance, strength, and coordination deficits secondary to normal pressure hydrocephalus. Patient is considered a fall risk, indicated by a score of 28/56 on Berg. Patient appears capable of improved performance during balance test, but is extremely  limited by fear of falling, stating need for AD during balance tasks. Patient is limited community ambulator, indicated by self-selected gait speed of 0.6439m Patient will benefit from skilled PT to address balance, strength and coordination deficits in order to maximize functional independence in the home.    Personal Factors and Comorbidities Time since onset of injury/illness/exacerbation;Age;Comorbidity 3+;Past/Current Experience;Transportation    Comorbidities osteoporosis, anemia, hx cancer, hyperlipidemia    Examination-Activity Limitations Bathing;Bed Mobility;Dressing;Reach Overhead;Stairs;Stand;Toileting;Locomotion Level;Squat;Transfers;Bend;Hygiene/Grooming;Carry;Sit    Examination-Participation Restrictions Community Activity;Driving;Shop;Yard Work;Cleaning;Meal Prep    Stability/Clinical Decision Making Evolving/Moderate complexity    Clinical Decision Making Moderate    Rehab Potential Good    PT Frequency 2x / week    PT Duration 8 weeks    PT Treatment/Interventions ADLs/Self Care Home Management;Aquatic Therapy;Cryotherapy;Electrical Stimulation;DME Instruction;Gait training;Stair training;Functional mobility training;Therapeutic activities;Therapeutic exercise;Balance training;Neuromuscular re-education;Patient/family education;Manual techniques;Passive range of motion;Dry needling;Spinal Manipulations;Joint Manipulations;Orthotic Fit/Training;Energy conservation    PT Next Visit Plan assess hamstring and quadricep flexibility, balance and BLE strength training, gait training, give HEP    PT Home Exercise Plan not administered this date    Consulted and Agree with Plan of Care Patient           Patient will benefit from skilled therapeutic intervention in order to improve the following deficits and impairments:  Abnormal gait, Decreased balance, Decreased mobility, Difficulty walking, Hypomobility, Decreased strength, Decreased knowledge of use of DME, Decreased coordination,  Postural dysfunction, Impaired flexibility, Improper body mechanics, Impaired perceived functional ability, Decreased activity tolerance  Visit Diagnosis: Unsteadiness on feet  Muscle weakness (generalized)  Abnormality of gait and mobility     Problem List Patient Active Problem List   Diagnosis Date Noted  . Cognitive deficits   . Labile blood pressure   .  Acute blood loss anemia   . Slow transit constipation   . Vascular headache   . 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 Hydrocephalus ([place]) 07/03/2017  . 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 Pelvic fracture ([place]) 04/18/2016  . CD (celiac disease) 08/04/2014  . Cancer of upper lobe of right lung ([place]) 08/04/2014  . Age related osteoporosis 11/26/2013  . Absolute anemia 05/21/2013  . H/O malignant neoplasm 05/21/2013  . HLD (hyperlipidemia) 05/21/2013    Tonny Bollman, SPT  This entire session was performed under direct supervision and direction of a licensed therapist/therapist assistant . I have personally read, edited and approve of the note as written.  Janna Arch, PT, DPT   10/20/2019, 7:50 PM  [place] MAIN [facility] SERVICES [address] [place], [place], [address] Phone: [phone]   Fax:  [phone]  Name: Krista Evans MRN: [id number] Date of Birth: 1945-07-23

## 2019-10-22 ENCOUNTER — Other Ambulatory Visit: Payer: Self-pay

## 2019-10-22 ENCOUNTER — Ambulatory Visit: Payer: PPO

## 2019-10-22 DIAGNOSIS — M6281 Muscle weakness (generalized): Secondary | ICD-10-CM

## 2019-10-22 DIAGNOSIS — R2681 Unsteadiness on feet: Secondary | ICD-10-CM | POA: Diagnosis not present

## 2019-10-22 DIAGNOSIS — R269 Unspecified abnormalities of gait and mobility: Secondary | ICD-10-CM

## 2019-10-22 NOTE — Therapy (Signed)
[place] MAIN [facility] SERVICES [address] [place], [place], [address] Phone: [phone]   Fax:  [phone]  Physical Therapy Treatment  Patient Details  Name: Krista Evans MRN: [id number] Date of Birth: April 06, 1945 Referring Provider (PT): Ramonita Lab   Encounter Date: 10/22/2019   PT End of Session - 10/22/19 1333    Visit Number 2    Number of Visits 17    Date for PT Re-Evaluation 12/15/19    Authorization Type 2/10 eval 10/20/19    PT Start Time 1113    PT Stop Time 1153    PT Time Calculation (min) 40 min    Equipment Utilized During Treatment Gait belt    Activity Tolerance Patient tolerated treatment well;Patient limited by fatigue    Behavior During Therapy WFL for tasks assessed/performed           Past Medical History:  Diagnosis Date   Abnormal Q waves on electrocardiogram    Anemia    Aortic atherosclerosis ([place])    "I could possibly have it, my grandmother had it"   Cancer ([place]) 05/2011   Right upper Lung CA with partial Lobectomy.   Celiac disease    Celiac disease    Dyspnea    with exertion   Hyperlipidemia    Hyperlipidemia    Lung mass    Meningioma (HCC)    Osteoporosis    Personal history of chemotherapy    Personal history of radiation therapy     Past Surgical History:  Procedure Laterality Date   LUNG LOBECTOMY     right lung   VENTRICULOPERITONEAL SHUNT Right 07/03/2017   Procedure: SHUNT INSERTION VENTRICULAR-PERITONEAL;  Surgeon: Newman Pies, MD;  Location: [place];  Service: Neurosurgery;  Laterality: Right;    There were no vitals filed for this visit.   Subjective Assessment - 10/22/19 1332    Subjective Patient reports no falls or LOB since last session. Is eager for her home program.    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 Patient has attended OP PT in the past year for LLE strengthening  and balance with positive results, but has not been in attendance recently due to COVID-19 pandemic. PMH includes osteoporosis, anemia, celiac disease, hyperlipidemia, hx of R upper lobe mass with resection 6/13, hx of pneumonia, meningioma, major atherosclerosis. VP shunt has been placed 06/2017.    Limitations Lifting;House hold activities;Standing;Walking    How long can you sit comfortably? n/a    How long can you stand comfortably? ~10 minutes (AM better than PM)    How long can you walk comfortably? a few mins until standing rest break with LLE off ground    Patient Stated Goals "to walk by myself"    Currently in Pain? No/denies                    Access Code: [email or web address] URL: [email or web address] Date: 10/22/2019 Prepared by: Janna Arch  Exercises   Standing March with Counter Support - 1 x daily - 7 x weekly - 10 reps - 3 sets  Standing Hip Flexion with Counter Support - 1 x daily - 7 x weekly - 10 reps - 3 sets  Standing Hip Extension - 1 x daily - 7 x weekly - 10 reps - 3 sets  Standing Knee Flexion - 1 x daily - 7 x weekly - 10 reps - 3 sets  Standing Hip  Abduction with Counter Support - 1 x daily - 7 x weekly - 10 reps - 3 sets  Mini Squat with Counter Support - 1 x daily - 7 x weekly - 10 reps - 3 sets  Treatment:    Ambulate with rollator 6x 86 ft with cues for decreasing windswept pattern of feet. Close CGA required and cues for big equal steps, turning is very challenging with increased stutter step and freezing of L leg.  First couple steps LLE straight requiring cues for hip/knee flexion  Standing heel raises 15x Single arm support with alt arm reach to wall 10x each UE for single arm support with dynamic reach   Seated: [place]edge[place]ogs seated taps, challenging with LLE spatial awareness.  RTB around ankles, alternating LAQ to PT hand 10x each LE  6" step toe taps 2x 30 seconds, very challenging LLE            PT Education -  10/22/19 1333    Education provided Yes    Education Details HEP, exercise technique, LLE coordination, strength    Person(s) Educated Patient    Methods Explanation;Demonstration;Tactile cues;Verbal cues;Handout    Comprehension Verbalized understanding;Returned demonstration;Verbal cues required;Tactile cues required            PT Short Term Goals - 10/20/19 1646      PT SHORT TERM GOAL #1   Title Patient will be independent in HEP demonstrating successful between session carryover and improve functional mobility and ease of completing ADLs.    Baseline 10/20/19: HEP not administered at evaluation    Time 4    Period Weeks    Status New    Target Date 11/17/19      PT SHORT TERM GOAL #2   Title Patient will increase ABC scale score >45% to demonstrate better functional mobility and better confidence with ADLs.    Baseline 10/20/19: 36%    Time 4    Period Weeks    Status New    Target Date 11/17/19             PT Long Term Goals - 10/20/19 1649      PT LONG TERM GOAL #1   Title Patient will increase FOTO score to equal to or greater than 49% to demonstrate statistically significant improvement in mobility and quality of life.    Baseline 10/20/19: 32%    Time 8    Period Weeks    Status New    Target Date 12/15/19      PT LONG TERM GOAL #2   Title Patient (> 40 years old) will complete five times sit to stand test in < 15 seconds without UE support indicating an increased LE strength and improved balance.    Baseline 10/20/19: 26.14 seconds with BUE support required    Time 8    Period Weeks    Status New    Target Date 12/15/19      PT LONG TERM GOAL #3   Title Patient will demonstrate an improved Berg Balance Score of >42/56 with minimal need for BUE support as to demonstrate improved balance with ADLs such as sitting/standing and transfer balance and reduced fall risk.    Baseline 10/20/19: 28/56 with limitations due to fear of falling    Time 8    Period Weeks     Status New    Target Date 12/15/19      PT LONG TERM GOAL #4   Title Patient will increase 10 meter walk  test to >1.37ms with improved foot clearance and increased hip flexion in swing phase as to improve gait speed for better community ambulation and to reduce fall risk.    Baseline 10/20/19: 0.631m with rollator, minimal hip flexion and foot clearance bilat    Time 8    Period Weeks    Status New    Target Date 12/15/19      PT LONG TERM GOAL #5   Title Patient will increase ABC scale score >60% to demonstrate better functional mobility and better confidence with ADLs.    Baseline 10/20/19: 36%    Time 8    Period Weeks    Status New    Target Date 12/15/19                 Plan - 10/22/19 1334    Clinical Impression Statement Patient presents with excellent motivation throughout physical therapy session. She performs standing Hep with good understanding and safety however straight leg flexion is challenging and patient requires heavy BUE support for all movements. Patient demonstrates windswept LE's to the R with ambulation that is minimally improved with cueing. Patient will benefit from skilled PT to address balance, strength and coordination deficits in order to maximize functional independence in the home.    Personal Factors and Comorbidities Time since onset of injury/illness/exacerbation;Age;Comorbidity 3+;Past/Current Experience;Transportation    Comorbidities osteoporosis, anemia, hx cancer, hyperlipidemia    Examination-Activity Limitations Bathing;Bed Mobility;Dressing;Reach Overhead;Stairs;Stand;Toileting;Locomotion Level;Squat;Transfers;Bend;Hygiene/Grooming;Carry;Sit    Examination-Participation Restrictions Community Activity;Driving;Shop;Yard Work;Cleaning;Meal Prep    Stability/Clinical Decision Making Evolving/Moderate complexity    Rehab Potential Good    PT Frequency 2x / week    PT Duration 8 weeks    PT Treatment/Interventions ADLs/Self Care Home  Management;Aquatic Therapy;Cryotherapy;Electrical Stimulation;DME Instruction;Gait training;Stair training;Functional mobility training;Therapeutic activities;Therapeutic exercise;Balance training;Neuromuscular re-education;Patient/family education;Manual techniques;Passive range of motion;Dry needling;Spinal Manipulations;Joint Manipulations;Orthotic Fit/Training;Energy conservation    PT Next Visit Plan assess hamstring and quadricep flexibility, balance and BLE strength training, gait training, give HEP    PT Home Exercise Plan not administered this date    Consulted and Agree with Plan of Care Patient           Patient will benefit from skilled therapeutic intervention in order to improve the following deficits and impairments:  Abnormal gait, Decreased balance, Decreased mobility, Difficulty walking, Hypomobility, Decreased strength, Decreased knowledge of use of DME, Decreased coordination, Postural dysfunction, Impaired flexibility, Improper body mechanics, Impaired perceived functional ability, Decreased activity tolerance  Visit Diagnosis: Unsteadiness on feet  Muscle weakness (generalized)  Abnormality of gait and mobility     Problem List Patient Active Problem List   Diagnosis Date Noted   Cognitive deficits    Labile blood pressure    Acute blood loss anemia    Slow transit constipation    Vascular headache    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   [place] ([place]/18/2019   Communicating hydrocephalus (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

## 2019-10-27 ENCOUNTER — Ambulatory Visit: Payer: PPO

## 2019-10-27 ENCOUNTER — Other Ambulatory Visit: Payer: Self-pay

## 2019-10-27 DIAGNOSIS — M6281 Muscle weakness (generalized): Secondary | ICD-10-CM

## 2019-10-27 DIAGNOSIS — R2681 Unsteadiness on feet: Secondary | ICD-10-CM | POA: Diagnosis not present

## 2019-10-27 DIAGNOSIS — R269 Unspecified abnormalities of gait and mobility: Secondary | ICD-10-CM

## 2019-10-27 NOTE — Therapy (Signed)
[place] MAIN [facility] SERVICES [address] [place], [place], [address] Phone: [phone]   Fax:  [phone]  Physical Therapy Treatment  Patient Details  Name: Krista Evans MRN: [id number] Date of Birth: 12/31/1945 Referring Provider (PT): Ramonita Lab   Encounter Date: 10/27/2019   PT End of Session - 10/27/19 1056    Visit Number 3    Number of Visits 17    Date for PT Re-Evaluation 12/15/19    Authorization Type 3/10 eval 10/20/19    PT Start Time 1100    PT Stop Time 1144    PT Time Calculation (min) 44 min    Equipment Utilized During Treatment Gait belt    Activity Tolerance Patient tolerated treatment well;Patient limited by fatigue    Behavior During Therapy WFL for tasks assessed/performed           Past Medical History:  Diagnosis Date  . Abnormal Q waves on electrocardiogram   . Anemia   . Aortic atherosclerosis ([place])    "I could possibly have it, my grandmother had it"  . Cancer [facility]) 05/2011   Right upper Lung CA with partial Lobectomy.  . Celiac disease   . Celiac disease   . Dyspnea    with exertion  . Hyperlipidemia   . Hyperlipidemia   . Lung mass   . Meningioma ([place])   . Osteoporosis   . Personal history of chemotherapy   . Personal history of radiation therapy     Past Surgical History:  Procedure Laterality Date  . LUNG LOBECTOMY     right lung  . VENTRICULOPERITONEAL SHUNT Right 07/03/2017   Procedure: SHUNT INSERTION VENTRICULAR-PERITONEAL;  Surgeon: Newman Pies, MD;  Location: [place];  Service: Neurosurgery;  Laterality: Right;    There were no vitals filed for this visit.   Subjective Assessment - 10/27/19 1057    Subjective Patient notes compliance with HEP and that they are not causing pain. Denies falls or LOB since last session.    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 Patient has attended OP PT in the  past year for LLE strengthening and balance with positive results, but has not been in attendance recently due to COVID-19 pandemic. PMH includes osteoporosis, anemia, celiac disease, hyperlipidemia, hx of R upper lobe mass with resection 6/13, hx of pneumonia, meningioma, major atherosclerosis. VP shunt has been placed 06/2017.    Limitations Lifting;House hold activities;Standing;Walking    How long can you sit comfortably? n/a    How long can you stand comfortably? ~10 minutes (AM better than PM)    How long can you walk comfortably? a few mins until standing rest break with LLE off ground    Patient Stated Goals "to walk by myself"    Currently in Pain? No/denies             Supine:  PT assesses hamstring flexibility to identify any implications on gait pattern. Patient demonstrates tightness in R hamstring and R piriformis.  Manual hamstring stretch. 30sec x 2 bilateral  Piriformis stretch 30sec bilateral   In // Bars:  Step up/over hurdle with visual targets to promote wider BOS maintenance. Visual target placed on ground for precision of foot placement. 12x each leg. PT provides HHA with other UE supported on bar.  Step taps on 6" step 12x each leg. Increased time to perform due to patient fear of falling with single UE support.  PT provides HHA to reduce fear. Patient attempts 4 marches with true SUE support on bar. Inc time and effort to perform, L more difficult to clear than R.  Side stepping 6x. CGA for safety. PT cues for foot clearance on trailing leg, as pt would slide foot to meet leading limb.    On Airex Pad:  Static stance with normal BOS 45 seconds   Normal BOS with EC 45 seconds   NBOS 45 seconds. Patient unable to place feet together due to fear of falling, but is able to maintain a more narrow base than normal.     Pt educated throughout session about proper posture and technique with exercises. Improved exercise technique, movement at target joints, use of  target muscles after min to mod verbal, visual, tactile cues.                        PT Education - 10/27/19 1055    Education provided Yes    Education Details exercise technique, body mechanics, righting reactions    Person(s) Educated Patient    Methods Explanation;Demonstration;Tactile cues;Verbal cues    Comprehension Verbalized understanding;Tactile cues required;Returned demonstration;Verbal cues required            PT Short Term Goals - 10/20/19 1646      PT SHORT TERM GOAL #1   Title Patient will be independent in HEP demonstrating successful between session carryover and improve functional mobility and ease of completing ADLs.    Baseline 10/20/19: HEP not administered at evaluation    Time 4    Period Weeks    Status New    Target Date 11/17/19      PT SHORT TERM GOAL #2   Title Patient will increase ABC scale score >45% to demonstrate better functional mobility and better confidence with ADLs.    Baseline 10/20/19: 36%    Time 4    Period Weeks    Status New    Target Date 11/17/19             PT Long Term Goals - 10/20/19 1649      PT LONG TERM GOAL #1   Title Patient will increase FOTO score to equal to or greater than 49% to demonstrate statistically significant improvement in mobility and quality of life.    Baseline 10/20/19: 32%    Time 8    Period Weeks    Status New    Target Date 12/15/19      PT LONG TERM GOAL #2   Title Patient (> 48 years old) will complete five times sit to stand test in < 15 seconds without UE support indicating an increased LE strength and improved balance.    Baseline 10/20/19: 26.14 seconds with BUE support required    Time 8    Period Weeks    Status New    Target Date 12/15/19      PT LONG TERM GOAL #3   Title Patient will demonstrate an improved Berg Balance Score of >42/56 with minimal need for BUE support as to demonstrate improved balance with ADLs such as sitting/standing and transfer balance  and reduced fall risk.    Baseline 10/20/19: 28/56 with limitations due to fear of falling    Time 8    Period Weeks    Status New    Target Date 12/15/19      PT LONG TERM GOAL #4   Title Patient will increase 10 meter walk  test to >1.53ms with improved foot clearance and increased hip flexion in swing phase as to improve gait speed for better community ambulation and to reduce fall risk.    Baseline 10/20/19: 0.674m with rollator, minimal hip flexion and foot clearance bilat    Time 8    Period Weeks    Status New    Target Date 12/15/19      PT LONG TERM GOAL #5   Title Patient will increase ABC scale score >60% to demonstrate better functional mobility and better confidence with ADLs.    Baseline 10/20/19: 36%    Time 8    Period Weeks    Status New    Target Date 12/15/19                 Plan - 10/27/19 1202    Clinical Impression Statement Patient continues to be limited in mobility due to fear of falling. PT provides constant positive reinforcement throughout session. Patient demonstrates capability of performing interventions with SUE support, but requires more confidence. Patient demonstrates improved foot clearance during gait at end of session after PT cues for big steps. Patient will benefit from skilled PT to address balance, strength and coordination deficits in order to maximize functional independence in the home.    Personal Factors and Comorbidities Time since onset of injury/illness/exacerbation;Age;Comorbidity 3+;Past/Current Experience;Transportation    Comorbidities osteoporosis, anemia, hx cancer, hyperlipidemia    Examination-Activity Limitations Bathing;Bed Mobility;Dressing;Reach Overhead;Stairs;Stand;Toileting;Locomotion Level;Squat;Transfers;Bend;Hygiene/Grooming;Carry;Sit    Examination-Participation Restrictions Community Activity;Driving;Shop;Yard Work;Cleaning;Meal Prep    Stability/Clinical Decision Making Evolving/Moderate complexity    Rehab  Potential Good    PT Frequency 2x / week    PT Duration 8 weeks    PT Treatment/Interventions ADLs/Self Care Home Management;Aquatic Therapy;Cryotherapy;Electrical Stimulation;DME Instruction;Gait training;Stair training;Functional mobility training;Therapeutic activities;Therapeutic exercise;Balance training;Neuromuscular re-education;Patient/family education;Manual techniques;Passive range of motion;Dry needling;Spinal Manipulations;Joint Manipulations;Orthotic Fit/Training;Energy conservation    PT Next Visit Plan assess hamstring and quadricep flexibility, balance and BLE strength training, gait training, give HEP    PT Home Exercise Plan not administered this date    Consulted and Agree with Plan of Care Patient           Patient will benefit from skilled therapeutic intervention in order to improve the following deficits and impairments:  Abnormal gait, Decreased balance, Decreased mobility, Difficulty walking, Hypomobility, Decreased strength, Decreased knowledge of use of DME, Decreased coordination, Postural dysfunction, Impaired flexibility, Improper body mechanics, Impaired perceived functional ability, Decreased activity tolerance  Visit Diagnosis: Unsteadiness on feet  Muscle weakness (generalized)  Abnormality of gait and mobility     Problem List Patient Active Problem List   Diagnosis Date Noted  . Cognitive deficits   . Labile blood pressure   . Acute blood loss anemia   . Slow transit constipation   . Vascular headache   . 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 Hydrocephalus (HCCannon AFB06/18/2019  . Communicating hydrocephalus (HCKenn[place]06/18/2019  . Pelvic fracture ([facility]CCall[place]04/03/2016  . CD (celiac disease) 08/04/2014  . Cancer of upper lobe of right lung (HCAl[place]07/19/2016  . Age related osteoporosis 11/26/2013  . Absolute anemia 05/21/2013  . H/O malignant neoplasm 05/21/2013  . HLD (hyperlipidemia) 05/21/2013   KaTonny BollmanSPT  This entire session was  performed under direct supervision and direction of a licensed therapist/therapist assistant . I have personally read, edited and approve of the note as written.  MaJanna ArchPT, DPT   10/27/2019, 12:05 PM  [facility] [phone]  [place], [place], [address] Phone: [phone]   Fax:  [phone]  Name: Krista Evans MRN: [id number] Date of Birth: 07-Dec-1945

## 2019-10-28 ENCOUNTER — Ambulatory Visit
Admission: RE | Admit: 2019-10-28 | Discharge: 2019-10-28 | Disposition: A | Discharge status: Home / Self Care | Payer: PPO | Source: Ambulatory Visit | Attending: Internal Medicine | Admitting: Internal Medicine

## 2019-10-28 DIAGNOSIS — D329 Benign neoplasm of meninges, unspecified: Secondary | ICD-10-CM | POA: Diagnosis not present

## 2019-10-28 DIAGNOSIS — I6782 Cerebral ischemia: Secondary | ICD-10-CM | POA: Diagnosis not present

## 2019-10-28 DIAGNOSIS — G912 (Idiopathic) normal pressure hydrocephalus: Secondary | ICD-10-CM | POA: Insufficient documentation

## 2019-10-28 DIAGNOSIS — J3489 Other specified disorders of nose and nasal sinuses: Secondary | ICD-10-CM | POA: Diagnosis not present

## 2019-10-28 DIAGNOSIS — G319 Degenerative disease of nervous system, unspecified: Secondary | ICD-10-CM | POA: Diagnosis not present

## 2019-10-29 ENCOUNTER — Other Ambulatory Visit: Payer: Self-pay

## 2019-10-29 ENCOUNTER — Ambulatory Visit: Payer: PPO

## 2019-10-29 DIAGNOSIS — R2681 Unsteadiness on feet: Secondary | ICD-10-CM

## 2019-10-29 DIAGNOSIS — R269 Unspecified abnormalities of gait and mobility: Secondary | ICD-10-CM

## 2019-10-29 DIAGNOSIS — M6281 Muscle weakness (generalized): Secondary | ICD-10-CM

## 2019-10-29 NOTE — Therapy (Signed)
[place] MAIN [facility] SERVICES [address] [place], [place], [address] Phone: [phone]   Fax:  [phone]  Physical Therapy Treatment  Patient Details  Name: Krista Evans MRN: [id number] Date of Birth: 1945/07/22 Referring Provider (PT): Ramonita Lab   Encounter Date: 10/29/2019   PT End of Session - 10/29/19 1250    Visit Number 4    Number of Visits 17    Date for PT Re-Evaluation 12/15/19    Authorization Type 4/10 eval 10/20/19    PT Start Time 1100    PT Stop Time 1144    PT Time Calculation (min) 44 min    Equipment Utilized During Treatment Gait belt    Activity Tolerance Patient tolerated treatment well;Patient limited by fatigue    Behavior During Therapy WFL for tasks assessed/performed           Past Medical History:  Diagnosis Date   Abnormal Q waves on electrocardiogram    Anemia    Aortic atherosclerosis ([place])    "I could possibly have it, my grandmother had it"   Cancer ([place]) 05/2011   Right upper Lung CA with partial Lobectomy.   Celiac disease    Celiac disease    Dyspnea    with exertion   Hyperlipidemia    Hyperlipidemia    Lung mass    Meningioma (HCC)    Osteoporosis    Personal history of chemotherapy    Personal history of radiation therapy     Past Surgical History:  Procedure Laterality Date   LUNG LOBECTOMY     right lung   VENTRICULOPERITONEAL SHUNT Right 07/03/2017   Procedure: SHUNT INSERTION VENTRICULAR-PERITONEAL;  Surgeon: Newman Pies, MD;  Location: [place];  Service: Neurosurgery;  Laterality: Right;    There were no vitals filed for this visit.   Subjective Assessment - 10/29/19 1102    Subjective Patient denies falls or LOB since last session. Patient notes "bumping" L knee on railing accidentally but was not caused by instability. 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 Patient has attended OP PT in the past year for LLE strengthening and balance with positive results, but has not been in attendance recently due to COVID-19 pandemic. PMH includes osteoporosis, anemia, celiac disease, hyperlipidemia, hx of R upper lobe mass with resection 6/13, hx of pneumonia, meningioma, major atherosclerosis. VP shunt has been placed 06/2017.    Limitations Lifting;House hold activities;Standing;Walking    How long can you sit comfortably? n/a    How long can you stand comfortably? ~10 minutes (AM better than PM)    How long can you walk comfortably? a few mins until standing rest break with LLE off ground    Patient Stated Goals "to walk by myself"    Currently in Pain? Yes    Pain Score 1     Pain Location Knee    Pain Orientation Left    Pain Descriptors / Indicators Discomfort    Pain Onset In the past 7 days    Pain Frequency Intermittent             Treatment:  Standing at support surface: Step taps on 6" step to promote hip/knee flexion during gait. Visual targets placed on step for consistency of foot placement. 12x each leg.  Patient organizes cards (hearts and diamonds) on raised mat table  while standing on Airex pad. PT provides HHA. Patient able to stand for 3 minutes with CGA and HHA without instances of LOB.   Gait: Patient ambulates ~74f to chair with HHA.6x. PT cued for inc knee flexion on L. Patient continues to demonstrate fear of falling during tasks, causing bilateral hip circumduction in swing and decreased arm swing. Patient displays dec reliance on HHA with inc hip/knee flexion as task continues.    Seated: Resisted hip abduction with red resistance band. 10x with 3 second holds. PT cues for slow return to resting position  BIsla Vistasqueezes 10x with 3 second holds.  Colored cone taps. PT calls for patient to toe tap specific colors to promote intentional BLE movement. 10x each  leg.  Seated with 2.5# ankle weights  -Seated marches with upright posture, back away from back of chair for abdominal/trunk activation/stabilization, 10x each LE -Seated LAQ with 3 second holds, 10x each LE, cueing for muscle activation and sequencing for neutral alignment      Pt educated throughout session about proper posture and technique with exercises. Improved exercise technique, movement at target joints, use of target muscles after min to mod verbal, visual, tactile cues.                  PT Education - 10/29/19 1249    Education provided Yes    Education Details gait mechanics, exercise technique, body mechanics    Person(s) Educated Patient    Methods Demonstration;Tactile cues;Verbal cues;Explanation    Comprehension Verbalized understanding;Tactile cues required;Returned demonstration;Verbal cues required            PT Short Term Goals - 10/20/19 1646      PT SHORT TERM GOAL #1   Title Patient will be independent in HEP demonstrating successful between session carryover and improve functional mobility and ease of completing ADLs.    Baseline 10/20/19: HEP not administered at evaluation    Time 4    Period Weeks    Status New    Target Date 11/17/19      PT SHORT TERM GOAL #2   Title Patient will increase ABC scale score >45% to demonstrate better functional mobility and better confidence with ADLs.    Baseline 10/20/19: 36%    Time 4    Period Weeks    Status New    Target Date 11/17/19             PT Long Term Goals - 10/20/19 1649      PT LONG TERM GOAL #1   Title Patient will increase FOTO score to equal to or greater than 49% to demonstrate statistically significant improvement in mobility and quality of life.    Baseline 10/20/19: 32%    Time 8    Period Weeks    Status New    Target Date 12/15/19      PT LONG TERM GOAL #2   Title Patient (> 616years old) will complete five times sit to stand test in < 15 seconds without UE support  indicating an increased LE strength and improved balance.    Baseline 10/20/19: 26.14 seconds with BUE support required    Time 8    Period Weeks    Status New    Target Date 12/15/19      PT LONG TERM GOAL #3   Title Patient will demonstrate an improved Berg Balance Score of >42/56 with minimal need for BUE support as to demonstrate improved balance with ADLs such as  sitting/standing and transfer balance and reduced fall risk.    Baseline 10/20/19: 28/56 with limitations due to fear of falling    Time 8    Period Weeks    Status New    Target Date 12/15/19      PT LONG TERM GOAL #4   Title Patient will increase 10 meter walk test to >1.24ms with improved foot clearance and increased hip flexion in swing phase as to improve gait speed for better community ambulation and to reduce fall risk.    Baseline 10/20/19: 0.635m with rollator, minimal hip flexion and foot clearance bilat    Time 8    Period Weeks    Status New    Target Date 12/15/19      PT LONG TERM GOAL #5   Title Patient will increase ABC scale score >60% to demonstrate better functional mobility and better confidence with ADLs.    Baseline 10/20/19: 36%    Time 8    Period Weeks    Status New    Target Date 12/15/19                 Plan - 10/29/19 1251    Clinical Impression Statement Patient continues to be limited by fear of falling. Initiated HHA activities this date in order to increase patient confidence with balance. Patient demonstrates dec weight bearing on LLE during gait tasks. Patient demonstrates capability of performing interventions with HHA support, but requires more confidence. Patient will benefit from skilled PT to address balance, strength and coordination deficits in order to maximize functional independence in the home.    Personal Factors and Comorbidities Time since onset of injury/illness/exacerbation;Age;Comorbidity 3+;Past/Current Experience;Transportation    Comorbidities osteoporosis,  anemia, hx cancer, hyperlipidemia    Examination-Activity Limitations Bathing;Bed Mobility;Dressing;Reach Overhead;Stairs;Stand;Toileting;Locomotion Level;Squat;Transfers;Bend;Hygiene/Grooming;Carry;Sit    Examination-Participation Restrictions Community Activity;Driving;Shop;Yard Work;Cleaning;Meal Prep    Stability/Clinical Decision Making Evolving/Moderate complexity    Rehab Potential Good    PT Frequency 2x / week    PT Duration 8 weeks    PT Treatment/Interventions ADLs/Self Care Home Management;Aquatic Therapy;Cryotherapy;Electrical Stimulation;DME Instruction;Gait training;Stair training;Functional mobility training;Therapeutic activities;Therapeutic exercise;Balance training;Neuromuscular re-education;Patient/family education;Manual techniques;Passive range of motion;Dry needling;Spinal Manipulations;Joint Manipulations;Orthotic Fit/Training;Energy conservation    PT Next Visit Plan assess hamstring and quadricep flexibility, balance and BLE strength training, gait training, give HEP    PT Home Exercise Plan not administered this date    Consulted and Agree with Plan of Care Patient           Patient will benefit from skilled therapeutic intervention in order to improve the following deficits and impairments:  Abnormal gait, Decreased balance, Decreased mobility, Difficulty walking, Hypomobility, Decreased strength, Decreased knowledge of use of DME, Decreased coordination, Postural dysfunction, Impaired flexibility, Improper body mechanics, Impaired perceived functional ability, Decreased activity tolerance  Visit Diagnosis: Unsteadiness on feet  Muscle weakness (generalized)  Abnormality of gait and mobility     Problem List Patient Active Problem List   Diagnosis Date Noted   Cognitive deficits    Labile blood pressure    Acute blood loss anemia    Slow transit constipation    Vascular headache    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 I have personally read, edited and approve of the note as written.  Janna Arch, PT, DPT   10/29/2019, 2:57 PM  [place] MAIN [facility] SERVICES [address] [place], [place], [address] Phone: [phone]   Fax:  [phone]  Name: JACOBY RITSEMA MRN: [id number] Date of Birth: 07-08-1945

## 2019-11-03 ENCOUNTER — Other Ambulatory Visit: Payer: Self-pay

## 2019-11-03 ENCOUNTER — Ambulatory Visit: Payer: PPO

## 2019-11-03 DIAGNOSIS — R2681 Unsteadiness on feet: Secondary | ICD-10-CM

## 2019-11-03 DIAGNOSIS — M6281 Muscle weakness (generalized): Secondary | ICD-10-CM

## 2019-11-03 DIAGNOSIS — R269 Unspecified abnormalities of gait and mobility: Secondary | ICD-10-CM

## 2019-11-03 NOTE — Therapy (Signed)
[place] MAIN [facility] SERVICES [address] [place], [place], [address] Phone: [phone]   Fax:  [phone]  Physical Therapy Treatment  Patient Details  Name: Krista Evans MRN: [id number] Date of Birth: 30-Dec-1945 Referring Provider (PT): Ramonita Lab   Encounter Date: 11/03/2019   PT End of Session - 11/03/19 1214    Visit Number 5    Number of Visits 17    Date for PT Re-Evaluation 12/15/19    Authorization Type 5/10 eval 10/20/19    PT Start Time 1115    PT Stop Time 1159    PT Time Calculation (min) 44 min    Equipment Utilized During Treatment Gait belt    Activity Tolerance Patient tolerated treatment well;Patient limited by fatigue    Behavior During Therapy [facility] for tasks assessed/performed           Past Medical History:  Diagnosis Date  . Abnormal Q waves on electrocardiogram   . Anemia   . Aortic atherosclerosis ([place])    "I could possibly have it, my grandmother had it"  . Cancer [facility]) 05/2011   Right upper Lung CA with partial Lobectomy.  . Celiac disease   . Celiac disease   . Dyspnea    with exertion  . Hyperlipidemia   . Hyperlipidemia   . Lung mass   . Meningioma ([place])   . Osteoporosis   . Personal history of chemotherapy   . Personal history of radiation therapy     Past Surgical History:  Procedure Laterality Date  . LUNG LOBECTOMY     right lung  . VENTRICULOPERITONEAL SHUNT Right 07/03/2017   Procedure: SHUNT INSERTION VENTRICULAR-PERITONEAL;  Surgeon: Newman Pies, MD;  Location: [place];  Service: Neurosurgery;  Laterality: Right;    There were no vitals filed for this visit.   Subjective Assessment - 11/03/19 1119    Subjective Patient denies falls or LOB since last session. Notes compliance with HEP but has questions about standing exercise.    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 Patient has attended OP PT in  the past year for LLE strengthening and balance with positive results, but has not been in attendance recently due to COVID-19 pandemic. PMH includes osteoporosis, anemia, celiac disease, hyperlipidemia, hx of R upper lobe mass with resection 6/13, hx of pneumonia, meningioma, major atherosclerosis. VP shunt has been placed 06/2017.    Limitations Lifting;House hold activities;Standing;Walking    How long can you sit comfortably? n/a    How long can you stand comfortably? ~10 minutes (AM better than PM)    How long can you walk comfortably? a few mins until standing rest break with LLE off ground    Patient Stated Goals "to walk by myself"    Currently in Pain? No/denies    Pain Onset In the past 7 days           Treatment: M/L weight shifting to promote weight acceptance on LLE. Performed 25x with BUE support d/t patient fear of falling.   Step taps onto 6" step with forward weight shift. CGA for safety. Progressed to SUE support to challenge balance. Performed 12x each leg. Patient requires mod verbal and tactile cues to shift weight onto LLE before stepping with R.  Step up/over hurdle with visual target for precision of foot placement. SUE support on bar to challenge balance. Patient Patient requires mod verbal and tactile cues to shift  weight onto LLE before stepping with R.  Gait: Patient ambulates ~38f x 2 with PT HHA. PT cues for big steps to promote inc foot clearance during swing bilaterally. Patient able to clear with RLE but continues to shuffle with LLE.   Progressed to ambulating further distances. Patient ambulates ~462fx 2 with HHA. Patient notes fatigue in L quad at ~3032fMod verbal cues for inc foot clearance bilaterally, with pt able to sustain for 4 gait cycles before reverting to shuffling with LLE. Near LOB x 2 with step strategy to stabilize.                          PT Education - 11/03/19 1213    Education provided Yes    Education Details  exercise technique, body mechanics, gait mechanics    Person(s) Educated Patient    Methods Explanation;Demonstration;Verbal cues;Tactile cues    Comprehension Verbalized understanding;Tactile cues required;Returned demonstration;Verbal cues required            PT Short Term Goals - 10/20/19 1646      PT SHORT TERM GOAL #1   Title Patient will be independent in HEP demonstrating successful between session carryover and improve functional mobility and ease of completing ADLs.    Baseline 10/20/19: HEP not administered at evaluation    Time 4    Period Weeks    Status New    Target Date 11/17/19      PT SHORT TERM GOAL #2   Title Patient will increase ABC scale score >45% to demonstrate better functional mobility and better confidence with ADLs.    Baseline 10/20/19: 36%    Time 4    Period Weeks    Status New    Target Date 11/17/19             PT Long Term Goals - 10/20/19 1649      PT LONG TERM GOAL #1   Title Patient will increase FOTO score to equal to or greater than 49% to demonstrate statistically significant improvement in mobility and quality of life.    Baseline 10/20/19: 32%    Time 8    Period Weeks    Status New    Target Date 12/15/19      PT LONG TERM GOAL #2   Title Patient (> 60 75ars old) will complete five times sit to stand test in < 15 seconds without UE support indicating an increased LE strength and improved balance.    Baseline 10/20/19: 26.14 seconds with BUE support required    Time 8    Period Weeks    Status New    Target Date 12/15/19      PT LONG TERM GOAL #3   Title Patient will demonstrate an improved Berg Balance Score of >42/56 with minimal need for BUE support as to demonstrate improved balance with ADLs such as sitting/standing and transfer balance and reduced fall risk.    Baseline 10/20/19: 28/56 with limitations due to fear of falling    Time 8    Period Weeks    Status New    Target Date 12/15/19      PT LONG TERM GOAL #4    Title Patient will increase 10 meter walk test to >1.45m/31mith improved foot clearance and increased hip flexion in swing phase as to improve gait speed for better community ambulation and to reduce fall risk.    Baseline 10/20/19: 0.12m/38mth rollator, minimal hip  flexion and foot clearance bilat    Time 8    Period Weeks    Status New    Target Date 12/15/19      PT LONG TERM GOAL #5   Title Patient will increase ABC scale score >60% to demonstrate better functional mobility and better confidence with ADLs.    Baseline 10/20/19: 36%    Time 8    Period Weeks    Status New    Target Date 12/15/19                 Plan - 11/03/19 1216    Clinical Impression Statement Patient able to perform tasks with SUE support in parallel bars, as opposed to requiring BUE support in previous sessions, but continues to be limited by fear of falling. Patient able to complete dynamic balance tasks at [facility] level when she properly shifts weight onto LLE and is able to perform at a more independent level. PT provides education throughout session on importance of weight shifting during stepping tasks in order to decrease incidence of falls. HHA gait trials improved this session, indicated via inc confidence and dec reliance on HHA with repetition, but patient continues to remain fearful. PT provides positive reinforcement throughout session. Patient will benefit from skilled PT to address balance, strength and coordination deficits in order to maximize functional independence in the home.    Personal Factors and Comorbidities Time since onset of injury/illness/exacerbation;Age;Comorbidity 3+;Past/Current Experience;Transportation    Comorbidities osteoporosis, anemia, hx cancer, hyperlipidemia    Examination-Activity Limitations Bathing;Bed Mobility;Dressing;Reach Overhead;Stairs;Stand;Toileting;Locomotion Level;Squat;Transfers;Bend;Hygiene/Grooming;Carry;Sit    Examination-Participation Restrictions Community  Activity;Driving;Shop;Yard Work;Cleaning;Meal Prep    Stability/Clinical Decision Making Evolving/Moderate complexity    Rehab Potential Good    PT Frequency 2x / week    PT Duration 8 weeks    PT Treatment/Interventions ADLs/Self Care Home Management;Aquatic Therapy;Cryotherapy;Electrical Stimulation;DME Instruction;Gait training;Stair training;Functional mobility training;Therapeutic activities;Therapeutic exercise;Balance training;Neuromuscular re-education;Patient/family education;Manual techniques;Passive range of motion;Dry needling;Spinal Manipulations;Joint Manipulations;Orthotic Fit/Training;Energy conservation    PT Next Visit Plan assess hamstring and quadricep flexibility, balance and BLE strength training, gait training, give HEP    PT Home Exercise Plan not administered this date    Consulted and Agree with Plan of Care Patient           Patient will benefit from skilled therapeutic intervention in order to improve the following deficits and impairments:  Abnormal gait, Decreased balance, Decreased mobility, Difficulty walking, Hypomobility, Decreased strength, Decreased knowledge of use of DME, Decreased coordination, Postural dysfunction, Impaired flexibility, Improper body mechanics, Impaired perceived functional ability, Decreased activity tolerance  Visit Diagnosis: Unsteadiness on feet  Muscle weakness (generalized)  Abnormality of gait and mobility     Problem List Patient Active Problem List   Diagnosis Date Noted  . Cognitive deficits   . Labile blood pressure   . Acute blood loss anemia   . Slow transit constipation   . Vascular headache   . 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 Hydrocephalus ([place]) 07/03/2017  . 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 Pelvic fracture ([place]) 04/18/2016  . CD (celiac disease) 08/04/2014  . Cancer of upper lobe of right lung ([place]) 08/04/2014  . Age related osteoporosis 11/26/2013  . Absolute anemia 05/21/2013  .  H/O malignant neoplasm 05/21/2013  . HLD (hyperlipidemia) 05/21/2013   Tonny Bollman, SPT  11/03/2019, 2:04 PM   This entire session was performed under the direct supervision of a liscensed physical therapist. I have reviewed and agree with student's findings and recommendations.  2:50 PM, 11/03/19 Etta Grandchild, PT, DPT Physical Therapist - [facility]  Outpatient Physical Therapy- [address] [place] MAIN [facility] SERVICES [address] [place], [place], [address] Phone: [phone]   Fax:  [phone]  Name: Krista Evans MRN: [id number] Date of Birth: 1945/04/19

## 2019-11-05 ENCOUNTER — Other Ambulatory Visit: Payer: Self-pay

## 2019-11-05 ENCOUNTER — Ambulatory Visit: Payer: PPO

## 2019-11-05 DIAGNOSIS — R2681 Unsteadiness on feet: Secondary | ICD-10-CM

## 2019-11-05 DIAGNOSIS — R269 Unspecified abnormalities of gait and mobility: Secondary | ICD-10-CM

## 2019-11-05 DIAGNOSIS — M6281 Muscle weakness (generalized): Secondary | ICD-10-CM

## 2019-11-05 NOTE — Therapy (Signed)
[place] MAIN [facility] SERVICES [address] [place], [place], [address] Phone: [phone]   Fax:  [phone]  Physical Therapy Treatment  Patient Details  Name: Krista Evans MRN: [id number] Date of Birth: 05/21/1945 Referring Provider (PT): Ramonita Lab   Encounter Date: 11/05/2019   PT End of Session - 11/05/19 1125    Visit Number 6    Number of Visits 17    Date for PT Re-Evaluation 12/15/19    Authorization Type 6/10 eval 10/20/19    PT Start Time 1100    PT Stop Time 1144    PT Time Calculation (min) 44 min    Equipment Utilized During Treatment Gait belt    Activity Tolerance Patient tolerated treatment well;Patient limited by fatigue    Behavior During Therapy WFL for tasks assessed/performed           Past Medical History:  Diagnosis Date  . Abnormal Q waves on electrocardiogram   . Anemia   . Aortic atherosclerosis ([place])    "I could possibly have it, my grandmother had it"  . Cancer [facility]) 05/2011   Right upper Lung CA with partial Lobectomy.  . Celiac disease   . Celiac disease   . Dyspnea    with exertion  . Hyperlipidemia   . Hyperlipidemia   . Lung mass   . Meningioma ([place])   . Osteoporosis   . Personal history of chemotherapy   . Personal history of radiation therapy     Past Surgical History:  Procedure Laterality Date  . LUNG LOBECTOMY     right lung  . VENTRICULOPERITONEAL SHUNT Right 07/03/2017   Procedure: SHUNT INSERTION VENTRICULAR-PERITONEAL;  Surgeon: Newman Pies, MD;  Location: [facility];  Service: Neurosurgery;  Laterality: Right;    There were no vitals filed for this visit.   Subjective Assessment - 11/05/19 1059    Subjective Patient arrives to session saying she's doing "okay" today. Denies any falls or LOB since last session. 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  Patient has attended OP PT in the past year for LLE strengthening and balance with positive results, but has not been in attendance recently due to COVID-19 pandemic. PMH includes osteoporosis, anemia, celiac disease, hyperlipidemia, hx of R upper lobe mass with resection 6/13, hx of pneumonia, meningioma, major atherosclerosis. VP shunt has been placed 06/2017.    Limitations Lifting;House hold activities;Standing;Walking    How long can you sit comfortably? n/a    How long can you stand comfortably? ~10 minutes (AM better than PM)    How long can you walk comfortably? a few mins until standing rest break with LLE off ground    Patient Stated Goals "to walk by myself"    Currently in Pain? No/denies    Pain Onset In the past 7 days          Treatment:  Patient organizes cards (clubs and spades) on raised mat table while standing on Airex pad. PT provides HHA. Patient able to stand for 4 minutes with CGA and HHA without instances of LOB. Chair behind for safety.  M/L weight shifting to promote weight acceptance on LLE. Performed 25x with SUE support on bar.   A/P weight shifting to promote weight acceptance on LLE. Performed 25x with SUE support on bar.   Step taps onto 6" step with forward weight shift.  CGA for safety. Progressed to SUE support to challenge balance. Performed 12x each leg. Patient requires mod verbal and tactile cues to shift weight onto LLE before stepping with R.  On Airex Pad: DF/PF rocking with fingertips on bar. CGA for safety. 12x each direction. Patient demonstrates dec reliance on bar with repetition  Seated: Marches with 3# ankle weights. 10x each leg. PT cues for arms across chest, as pt would demonstrate BUE reliance on chair  LAQ with 3# ankle weight. 10x each leg  Gait: Patient ambulates ~50f x 2 with PT HHA. PT cues for big steps to promote inc foot clearance during swing bilaterally. Patient able to clear with RLE but continues to shuffle with LLE.  Patient demonstrates fear of falling during second gait trial, causing sudden stops and reaching for surroundings.  Pt educated throughout session about proper posture and technique with exercises. Improved exercise technique, movement at target joints, use of target muscles after min to mod verbal, visual, tactile cues                            PT Education - 11/05/19 1124    Education provided Yes    Education Details body mechanics, exercise tecnique, gait mechanics    Person(s) Educated Patient    Methods Explanation;Demonstration;Tactile cues;Verbal cues    Comprehension Tactile cues required;Verbalized understanding;Returned demonstration;Verbal cues required            PT Short Term Goals - 10/20/19 1646      PT SHORT TERM GOAL #1   Title Patient will be independent in HEP demonstrating successful between session carryover and improve functional mobility and ease of completing ADLs.    Baseline 10/20/19: HEP not administered at evaluation    Time 4    Period Weeks    Status New    Target Date 11/17/19      PT SHORT TERM GOAL #2   Title Patient will increase ABC scale score >45% to demonstrate better functional mobility and better confidence with ADLs.    Baseline 10/20/19: 36%    Time 4    Period Weeks    Status New    Target Date 11/17/19             PT Long Term Goals - 10/20/19 1649      PT LONG TERM GOAL #1   Title Patient will increase FOTO score to equal to or greater than 49% to demonstrate statistically significant improvement in mobility and quality of life.    Baseline 10/20/19: 32%    Time 8    Period Weeks    Status New    Target Date 12/15/19      PT LONG TERM GOAL #2   Title Patient (> 624years old) will complete five times sit to stand test in < 15 seconds without UE support indicating an increased LE strength and improved balance.    Baseline 10/20/19: 26.14 seconds with BUE support required    Time 8    Period Weeks     Status New    Target Date 12/15/19      PT LONG TERM GOAL #3   Title Patient will demonstrate an improved Berg Balance Score of >42/56 with minimal need for BUE support as to demonstrate improved balance with ADLs such as sitting/standing and transfer balance and reduced fall risk.    Baseline 10/20/19: 28/56 with limitations due to fear of falling  Time 8    Period Weeks    Status New    Target Date 12/15/19      PT LONG TERM GOAL #4   Title Patient will increase 10 meter walk test to >1.72ms with improved foot clearance and increased hip flexion in swing phase as to improve gait speed for better community ambulation and to reduce fall risk.    Baseline 10/20/19: 0.68m with rollator, minimal hip flexion and foot clearance bilat    Time 8    Period Weeks    Status New    Target Date 12/15/19      PT LONG TERM GOAL #5   Title Patient will increase ABC scale score >60% to demonstrate better functional mobility and better confidence with ADLs.    Baseline 10/20/19: 36%    Time 8    Period Weeks    Status New    Target Date 12/15/19                 Plan - 11/05/19 1214    Clinical Impression Statement Patient demonstrates improved weight acceptance onto LLE during gait trials this session. Patient continues to demonstrate fear of falling without use of AD, but frequency has decreased since last visit. BLE strength continues to be an area to address, as pt c/o quad fatigue towards end of gait trials. Patient will benefit from skilled PT to address balance, strength and coordination deficits in order to maximize functional independence in the home.    Personal Factors and Comorbidities Time since onset of injury/illness/exacerbation;Age;Comorbidity 3+;Past/Current Experience;Transportation    Comorbidities osteoporosis, anemia, hx cancer, hyperlipidemia    Examination-Activity Limitations Bathing;Bed Mobility;Dressing;Reach Overhead;Stairs;Stand;Toileting;Locomotion  Level;Squat;Transfers;Bend;Hygiene/Grooming;Carry;Sit    Examination-Participation Restrictions Community Activity;Driving;Shop;Yard Work;Cleaning;Meal Prep    Stability/Clinical Decision Making Evolving/Moderate complexity    Rehab Potential Good    PT Frequency 2x / week    PT Duration 8 weeks    PT Treatment/Interventions ADLs/Self Care Home Management;Aquatic Therapy;Cryotherapy;Electrical Stimulation;DME Instruction;Gait training;Stair training;Functional mobility training;Therapeutic activities;Therapeutic exercise;Balance training;Neuromuscular re-education;Patient/family education;Manual techniques;Passive range of motion;Dry needling;Spinal Manipulations;Joint Manipulations;Orthotic Fit/Training;Energy conservation    PT Next Visit Plan assess hamstring and quadricep flexibility, balance and BLE strength training, gait training, give HEP    PT Home Exercise Plan not administered this date    Consulted and Agree with Plan of Care Patient           Patient will benefit from skilled therapeutic intervention in order to improve the following deficits and impairments:  Abnormal gait, Decreased balance, Decreased mobility, Difficulty walking, Hypomobility, Decreased strength, Decreased knowledge of use of DME, Decreased coordination, Postural dysfunction, Impaired flexibility, Improper body mechanics, Impaired perceived functional ability, Decreased activity tolerance  Visit Diagnosis: Unsteadiness on feet  Muscle weakness (generalized)  Abnormality of gait and mobility     Problem List Patient Active Problem List   Diagnosis Date Noted  . Cognitive deficits   . Labile blood pressure   . Acute blood loss anemia   . Slow transit constipation   . Vascular headache   . 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 Hydrocephalus ([facility]06/18/2019  . 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 Pelvic fracture ([facility]04/03/2016  . CD (celiac disease) 08/04/2014  . Cancer of upper lobe  of right lung ([facility]07/19/2016  . Age related osteoporosis 11/26/2013  . Absolute anemia 05/21/2013  . H/O malignant neoplasm 05/21/2013  . HLD (hyperlipidemia) 05/21/2013   KaTonny BollmanSPT  This entire session was performed under direct supervision and direction of a  licensed Chiropractor . I have personally read, edited and approve of the note as written.  Janna Arch, PT, DPT   11/05/2019, 2:22 PM  [place] MAIN [facility] SERVICES [address] [place], [place], [address] Phone: [phone]   Fax:  [phone]  Name: ANDILYNN DELAVEGA MRN: [id number] Date of Birth: 1945-09-14

## 2019-11-10 ENCOUNTER — Ambulatory Visit: Payer: PPO

## 2019-11-12 ENCOUNTER — Ambulatory Visit: Payer: PPO

## 2019-11-12 ENCOUNTER — Other Ambulatory Visit: Payer: Self-pay

## 2019-11-12 DIAGNOSIS — R2681 Unsteadiness on feet: Secondary | ICD-10-CM

## 2019-11-12 DIAGNOSIS — M6281 Muscle weakness (generalized): Secondary | ICD-10-CM

## 2019-11-12 DIAGNOSIS — R269 Unspecified abnormalities of gait and mobility: Secondary | ICD-10-CM

## 2019-11-12 NOTE — Therapy (Signed)
[place] MAIN [facility] SERVICES [address] [place], [place], [address] Phone: [phone]   Fax:  [phone]  Physical Therapy Treatment  Patient Details  Name: Krista Evans MRN: [id number] Date of Birth: September 05, 1945 Referring Provider (PT): Ramonita Lab   Encounter Date: 11/12/2019   PT End of Session - 11/12/19 1124    Visit Number 7    Number of Visits 17    Date for PT Re-Evaluation 12/15/19    Authorization Type 7/10 eval 10/20/19    PT Start Time 1100    PT Stop Time 1144    PT Time Calculation (min) 44 min    Equipment Utilized During Treatment Gait belt    Activity Tolerance Patient tolerated treatment well;Patient limited by fatigue    Behavior During Therapy WFL for tasks assessed/performed           Past Medical History:  Diagnosis Date  . Abnormal Q waves on electrocardiogram   . Anemia   . Aortic atherosclerosis ([place])    "I could possibly have it, my grandmother had it"  . Cancer [facility]) 05/2011   Right upper Lung CA with partial Lobectomy.  . Celiac disease   . Celiac disease   . Dyspnea    with exertion  . Hyperlipidemia   . Hyperlipidemia   . Lung mass   . Meningioma ([place])   . Osteoporosis   . Personal history of chemotherapy   . Personal history of radiation therapy     Past Surgical History:  Procedure Laterality Date  . LUNG LOBECTOMY     right lung  . VENTRICULOPERITONEAL SHUNT Right 07/03/2017   Procedure: SHUNT INSERTION VENTRICULAR-PERITONEAL;  Surgeon: Newman Pies, MD;  Location: [facility];  Service: Neurosurgery;  Laterality: Right;    There were no vitals filed for this visit.   Subjective Assessment - 11/12/19 1102    Subjective Patient notes 1 fall on 10/22 while preparing to perform exercises. Patient notes hitting her knee off surface and falling backwards. Patient states that husband helped to get her off the ground. Denies head injury, but notes bruise on R buttock and some pain in low  back.    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 Patient has attended OP PT in the past year for LLE strengthening and balance with positive results, but has not been in attendance recently due to COVID-19 pandemic. PMH includes osteoporosis, anemia, celiac disease, hyperlipidemia, hx of R upper lobe mass with resection 6/13, hx of pneumonia, meningioma, major atherosclerosis. VP shunt has been placed 06/2017.    Limitations Lifting;House hold activities;Standing;Walking    How long can you sit comfortably? n/a    How long can you stand comfortably? ~10 minutes (AM better than PM)    How long can you walk comfortably? a few mins until standing rest break with LLE off ground    Patient Stated Goals "to walk by myself"    Currently in Pain? No/denies           Treatment: NuStep level 1, 3 minutes for general strengthening  Seated: Ball squeezes with green small ball. 12x 3 second holds.  Resisted hip abduction with green resistance band. 12x 3 second holds  Step taps onto 6" step with 2# ankle weights. PT cues for unsupported sitting and core activation during task to challenge dynamic sitting balance. 12x each leg (24x total)   Gait: Patient ambulates ~80f x 2  with PT HHA. PT cues for hip flexion and knee flexion throughout trials, as patient would ambulate with bilateral hip circumduction with knees rigidly in extension. Patient able to demonstrate improved foot clearance via hip flexion by end of gait trial.    Pt educated throughout session about proper posture and technique with exercises. Improved exercise technique, movement at target joints, use of target muscles after min to mod verbal, visual, tactile cues.                             PT Education - 11/12/19 1124    Education provided Yes    Education Details exercise technique, gait safety, body mechanics    Person(s)  Educated Patient    Methods Explanation;Demonstration;Tactile cues;Verbal cues    Comprehension Verbalized understanding;Tactile cues required;Returned demonstration;Verbal cues required            PT Short Term Goals - 10/20/19 1646      PT SHORT TERM GOAL #1   Title Patient will be independent in HEP demonstrating successful between session carryover and improve functional mobility and ease of completing ADLs.    Baseline 10/20/19: HEP not administered at evaluation    Time 4    Period Weeks    Status New    Target Date 11/17/19      PT SHORT TERM GOAL #2   Title Patient will increase ABC scale score >45% to demonstrate better functional mobility and better confidence with ADLs.    Baseline 10/20/19: 36%    Time 4    Period Weeks    Status New    Target Date 11/17/19             PT Long Term Goals - 10/20/19 1649      PT LONG TERM GOAL #1   Title Patient will increase FOTO score to equal to or greater than 49% to demonstrate statistically significant improvement in mobility and quality of life.    Baseline 10/20/19: 32%    Time 8    Period Weeks    Status New    Target Date 12/15/19      PT LONG TERM GOAL #2   Title Patient (> 41 years old) will complete five times sit to stand test in < 15 seconds without UE support indicating an increased LE strength and improved balance.    Baseline 10/20/19: 26.14 seconds with BUE support required    Time 8    Period Weeks    Status New    Target Date 12/15/19      PT LONG TERM GOAL #3   Title Patient will demonstrate an improved Berg Balance Score of >42/56 with minimal need for BUE support as to demonstrate improved balance with ADLs such as sitting/standing and transfer balance and reduced fall risk.    Baseline 10/20/19: 28/56 with limitations due to fear of falling    Time 8    Period Weeks    Status New    Target Date 12/15/19      PT LONG TERM GOAL #4   Title Patient will increase 10 meter walk test to >1.62ms with  improved foot clearance and increased hip flexion in swing phase as to improve gait speed for better community ambulation and to reduce fall risk.    Baseline 10/20/19: 0.624m with rollator, minimal hip flexion and foot clearance bilat    Time 8    Period Weeks    Status  New    Target Date 12/15/19      PT LONG TERM GOAL #5   Title Patient will increase ABC scale score >60% to demonstrate better functional mobility and better confidence with ADLs.    Baseline 10/20/19: 36%    Time 8    Period Weeks    Status New    Target Date 12/15/19                 Plan - 11/12/19 1150    Clinical Impression Statement Patient appears more fearful this session due to fall in the past week. PT provides positive reinforcement throughout session with positive outcome, as patient demonstrates improved ability to clear BLEs during swing phase by the end of the session. Seated strengthening focused on LLE gains, as pt notes weakness on that side. Patient will benefit from skilled PT to address balance, strength and coordination deficits in order to maximize functional independence in the home.    Personal Factors and Comorbidities Time since onset of injury/illness/exacerbation;Age;Comorbidity 3+;Past/Current Experience;Transportation    Comorbidities osteoporosis, anemia, hx cancer, hyperlipidemia    Examination-Activity Limitations Bathing;Bed Mobility;Dressing;Reach Overhead;Stairs;Stand;Toileting;Locomotion Level;Squat;Transfers;Bend;Hygiene/Grooming;Carry;Sit    Examination-Participation Restrictions Community Activity;Driving;Shop;Yard Work;Cleaning;Meal Prep    Stability/Clinical Decision Making Evolving/Moderate complexity    Rehab Potential Good    PT Frequency 2x / week    PT Duration 8 weeks    PT Treatment/Interventions ADLs/Self Care Home Management;Aquatic Therapy;Cryotherapy;Electrical Stimulation;DME Instruction;Gait training;Stair training;Functional mobility training;Therapeutic  activities;Therapeutic exercise;Balance training;Neuromuscular re-education;Patient/family education;Manual techniques;Passive range of motion;Dry needling;Spinal Manipulations;Joint Manipulations;Orthotic Fit/Training;Energy conservation    PT Next Visit Plan assess hamstring and quadricep flexibility, balance and BLE strength training, gait training, give HEP    PT Home Exercise Plan not administered this date    Consulted and Agree with Plan of Care Patient           Patient will benefit from skilled therapeutic intervention in order to improve the following deficits and impairments:  Abnormal gait, Decreased balance, Decreased mobility, Difficulty walking, Hypomobility, Decreased strength, Decreased knowledge of use of DME, Decreased coordination, Postural dysfunction, Impaired flexibility, Improper body mechanics, Impaired perceived functional ability, Decreased activity tolerance  Visit Diagnosis: Unsteadiness on feet  Muscle weakness (generalized)  Abnormality of gait and mobility     Problem List Patient Active Problem List   Diagnosis Date Noted  . Cognitive deficits   . Labile blood pressure   . Acute blood loss anemia   . Slow transit constipation   . Vascular headache   . 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 Hydrocephalus ([place]) 07/03/2017  . 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 Pelvic fracture ([place]) 04/18/2016  . CD (celiac disease) 08/04/2014  . Cancer of upper lobe of right lung ([place]) 08/04/2014  . Age related osteoporosis 11/26/2013  . Absolute anemia 05/21/2013  . H/O malignant neoplasm 05/21/2013  . HLD (hyperlipidemia) 05/21/2013   Tonny Bollman, SPT  This entire session was performed under direct supervision and direction of a licensed therapist/therapist assistant . I have personally read, edited and approve of the note as written.  Janna Arch, PT, DPT   11/12/2019, 1:46 PM  [place] MAIN [facility]  SERVICES [address] [place], [place], [address] Phone: [phone]   Fax:  [phone]  Name: Krista Evans MRN: [id number] Date of Birth: December 19, 1945

## 2019-11-17 ENCOUNTER — Other Ambulatory Visit: Payer: Self-pay

## 2019-11-17 ENCOUNTER — Ambulatory Visit: Payer: PPO | Attending: Internal Medicine

## 2019-11-17 DIAGNOSIS — R269 Unspecified abnormalities of gait and mobility: Secondary | ICD-10-CM | POA: Diagnosis not present

## 2019-11-17 DIAGNOSIS — R2681 Unsteadiness on feet: Secondary | ICD-10-CM | POA: Insufficient documentation

## 2019-11-17 DIAGNOSIS — M6281 Muscle weakness (generalized): Secondary | ICD-10-CM | POA: Diagnosis not present

## 2019-11-17 NOTE — Therapy (Signed)
[place] MAIN [facility] SERVICES [address] [place], [place], [address] Phone: [phone]   Fax:  [phone]  Physical Therapy Treatment  Patient Details  Name: Krista Evans MRN: [id number] Date of Birth: November 05, 1945 Referring Provider (PT): Ramonita Lab   Encounter Date: 11/17/2019   PT End of Session - 11/17/19 1115    Visit Number 8    Number of Visits 17    Date for PT Re-Evaluation 12/15/19    Authorization Type 8/10 eval 10/20/19    PT Start Time 1109    PT Stop Time 1150    PT Time Calculation (min) 41 min    Equipment Utilized During Treatment Gait belt    Activity Tolerance Patient tolerated treatment well;Patient limited by fatigue    Behavior During Therapy WFL for tasks assessed/performed           Past Medical History:  Diagnosis Date  . Abnormal Q waves on electrocardiogram   . Anemia   . Aortic atherosclerosis ([place])    "I could possibly have it, my grandmother had it"  . Cancer [facility]) 05/2011   Right upper Lung CA with partial Lobectomy.  . Celiac disease   . Celiac disease   . Dyspnea    with exertion  . Hyperlipidemia   . Hyperlipidemia   . Lung mass   . Meningioma ([place])   . Osteoporosis   . Personal history of chemotherapy   . Personal history of radiation therapy     Past Surgical History:  Procedure Laterality Date  . LUNG LOBECTOMY     right lung  . VENTRICULOPERITONEAL SHUNT Right 07/03/2017   Procedure: SHUNT INSERTION VENTRICULAR-PERITONEAL;  Surgeon: Newman Pies, MD;  Location: [place];  Service: Neurosurgery;  Laterality: Right;    There were no vitals filed for this visit.   Subjective Assessment - 11/17/19 1114    Subjective Patient reports no falls or LOB since last session. Had a quiet weekend, not able to walk as much due to her husband's condition.    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 Patient has  attended OP PT in the past year for LLE strengthening and balance with positive results, but has not been in attendance recently due to COVID-19 pandemic. PMH includes osteoporosis, anemia, celiac disease, hyperlipidemia, hx of R upper lobe mass with resection 6/13, hx of pneumonia, meningioma, major atherosclerosis. VP shunt has been placed 06/2017.    Limitations Lifting;House hold activities;Standing;Walking    How long can you sit comfortably? n/a    How long can you stand comfortably? ~10 minutes (AM better than PM)    How long can you walk comfortably? a few mins until standing rest break with LLE off ground    Patient Stated Goals "to walk by myself"    Currently in Pain? No/denies                 Treatment: NuStep level 2, 4 minutes for general strengthening, cardiovascular challenge, and capacity for prolonged mobilization.    Seated: RTB hamstring curls 10x each LE RTB around knees alternating abduction 15x each LE RTB around ankles: green ball between knees alternating ER/IR 12x each LE     Gait/mobility Patient ambulates ~63f x 2 with PT HHA. PT cues for hip flexion and knee flexion throughout trials, as patient would ambulate with bilateral hip circumduction with knees rigidly in extension. Patient able to demonstrate improved foot  clearance via hip flexion by end of gait trial.    Ambulate with rollator and close CGA, cues for decreasing and increasing velocity of ambulation, very challenging for slow ambulation with decreasing foot clearance bilaterally. 200 ft   Standing at support bar:  High knee marches with BUE support, cue on slow eccentric/concentric contraction 20x Sit to stand 10x  High knee march with consequent LE extension for toy soldier march BUE support 10x each LE   Pt educated throughout session about proper posture and technique with exercises. Improved exercise technique, movement at target joints, use of target muscles after min to mod verbal,  visual, tactile cues.                       PT Education - 11/17/19 1114    Education provided Yes    Education Details exercise technique, body mechanics,    Person(s) Educated Patient    Methods Explanation;Demonstration;Tactile cues;Verbal cues    Comprehension Verbalized understanding;Returned demonstration;Verbal cues required;Tactile cues required            PT Short Term Goals - 10/20/19 1646      PT SHORT TERM GOAL #1   Title Patient will be independent in HEP demonstrating successful between session carryover and improve functional mobility and ease of completing ADLs.    Baseline 10/20/19: HEP not administered at evaluation    Time 4    Period Weeks    Status New    Target Date 11/17/19      PT SHORT TERM GOAL #2   Title Patient will increase ABC scale score >45% to demonstrate better functional mobility and better confidence with ADLs.    Baseline 10/20/19: 36%    Time 4    Period Weeks    Status New    Target Date 11/17/19             PT Long Term Goals - 10/20/19 1649      PT LONG TERM GOAL #1   Title Patient will increase FOTO score to equal to or greater than 49% to demonstrate statistically significant improvement in mobility and quality of life.    Baseline 10/20/19: 32%    Time 8    Period Weeks    Status New    Target Date 12/15/19      PT LONG TERM GOAL #2   Title Patient (> 45 years old) will complete five times sit to stand test in < 15 seconds without UE support indicating an increased LE strength and improved balance.    Baseline 10/20/19: 26.14 seconds with BUE support required    Time 8    Period Weeks    Status New    Target Date 12/15/19      PT LONG TERM GOAL #3   Title Patient will demonstrate an improved Berg Balance Score of >42/56 with minimal need for BUE support as to demonstrate improved balance with ADLs such as sitting/standing and transfer balance and reduced fall risk.    Baseline 10/20/19: 28/56 with  limitations due to fear of falling    Time 8    Period Weeks    Status New    Target Date 12/15/19      PT LONG TERM GOAL #4   Title Patient will increase 10 meter walk test to >1.64ms with improved foot clearance and increased hip flexion in swing phase as to improve gait speed for better community ambulation and to reduce fall risk.  Baseline 10/20/19: 0.14ms with rollator, minimal hip flexion and foot clearance bilat    Time 8    Period Weeks    Status New    Target Date 12/15/19      PT LONG TERM GOAL #5   Title Patient will increase ABC scale score >60% to demonstrate better functional mobility and better confidence with ADLs.    Baseline 10/20/19: 36%    Time 8    Period Weeks    Status New    Target Date 12/15/19                 Plan - 11/17/19 1222    Clinical Impression Statement Patient is very challenged with foot clearance with decreased velocity of ambulation, requiring verbal cueing and tactile cueing for sequencing. Patient is highly motivated though fear based requiring frequent use of positive encouragement and education on why a task with help her. Patient will benefit from skilled PT to address balance, strength and coordination deficits in order to maximize functional independence in the home.    Personal Factors and Comorbidities Time since onset of injury/illness/exacerbation;Age;Comorbidity 3+;Past/Current Experience;Transportation    Comorbidities osteoporosis, anemia, hx cancer, hyperlipidemia    Examination-Activity Limitations Bathing;Bed Mobility;Dressing;Reach Overhead;Stairs;Stand;Toileting;Locomotion Level;Squat;Transfers;Bend;Hygiene/Grooming;Carry;Sit    Examination-Participation Restrictions Community Activity;Driving;Shop;Yard Work;Cleaning;Meal Prep    Stability/Clinical Decision Making Evolving/Moderate complexity    Rehab Potential Good    PT Frequency 2x / week    PT Duration 8 weeks    PT Treatment/Interventions ADLs/Self Care Home  Management;Aquatic Therapy;Cryotherapy;Electrical Stimulation;DME Instruction;Gait training;Stair training;Functional mobility training;Therapeutic activities;Therapeutic exercise;Balance training;Neuromuscular re-education;Patient/family education;Manual techniques;Passive range of motion;Dry needling;Spinal Manipulations;Joint Manipulations;Orthotic Fit/Training;Energy conservation    PT Next Visit Plan assess hamstring and quadricep flexibility, balance and BLE strength training, gait training, give HEP    PT Home Exercise Plan not administered this date    Consulted and Agree with Plan of Care Patient           Patient will benefit from skilled therapeutic intervention in order to improve the following deficits and impairments:  Abnormal gait, Decreased balance, Decreased mobility, Difficulty walking, Hypomobility, Decreased strength, Decreased knowledge of use of DME, Decreased coordination, Postural dysfunction, Impaired flexibility, Improper body mechanics, Impaired perceived functional ability, Decreased activity tolerance  Visit Diagnosis: Unsteadiness on feet  Muscle weakness (generalized)  Abnormality of gait and mobility     Problem List Patient Active Problem List   Diagnosis Date Noted  . Cognitive deficits   . Labile blood pressure   . Acute blood loss anemia   . Slow transit constipation   . Vascular headache   . 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 [place]cephalus ([place] 07/03/2017  . 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 [place] 04/18/2016  . CD (celiac disease) 08/04/2014  . Cancer of [place] of right lung ([place] 08/04/2014  . Age related osteoporosis 11/26/2013  . Absolute anemia 05/21/2013  . H/O malignant neoplasm 05/21/2013  . HLD (hyperlipidemia) 05/21/2013   MJanna Arch PT, DPT   11/17/2019, 12:24 PM  [place] [facility] [address] [address]Phone:  [phone]  Fax:  [phone] Name: MRUTHANNA MACCHIAMRN: [id number] of Birth: [birth year]

## 2019-11-20 ENCOUNTER — Other Ambulatory Visit: Payer: Self-pay

## 2019-11-20 ENCOUNTER — Ambulatory Visit: Payer: PPO

## 2019-11-20 DIAGNOSIS — M6281 Muscle weakness (generalized): Secondary | ICD-10-CM

## 2019-11-20 DIAGNOSIS — R2681 Unsteadiness on feet: Secondary | ICD-10-CM

## 2019-11-20 DIAGNOSIS — R269 Unspecified abnormalities of gait and mobility: Secondary | ICD-10-CM

## 2019-11-20 NOTE — Therapy (Signed)
[place] MAIN [facility] SERVICES [address] [place], [place], [address] Phone: [phone]   Fax:  [phone]  Physical Therapy Treatment  Patient Details  Name: Krista Evans MRN: [id number] Date of Birth: 02/26/1945 Referring Provider (PT): Ramonita Lab   Encounter Date: 11/20/2019   PT End of Session - 11/20/19 0926    Visit Number 9    Number of Visits 17    Date for PT Re-Evaluation 12/15/19    Authorization Type 9/10 eval 10/20/19    PT Start Time 0928    PT Stop Time 1013    PT Time Calculation (min) 45 min    Equipment Utilized During Treatment Gait belt    Activity Tolerance Patient tolerated treatment well;Patient limited by fatigue    Behavior During Therapy [facility] for tasks assessed/performed           Past Medical History:  Diagnosis Date  . Abnormal Q waves on electrocardiogram   . Anemia   . Aortic atherosclerosis ([place])    "I could possibly have it, my grandmother had it"  . Cancer [facility]) 05/2011   Right upper Lung CA with partial Lobectomy.  . Celiac disease   . Celiac disease   . Dyspnea    with exertion  . Hyperlipidemia   . Hyperlipidemia   . Lung mass   . Meningioma ([place])   . Osteoporosis   . Personal history of chemotherapy   . Personal history of radiation therapy     Past Surgical History:  Procedure Laterality Date  . LUNG LOBECTOMY     right lung  . VENTRICULOPERITONEAL SHUNT Right 07/03/2017   Procedure: SHUNT INSERTION VENTRICULAR-PERITONEAL;  Surgeon: Newman Pies, MD;  Location[place];  Service: Neurosurgery;  Laterality: Right;    There were no vitals filed for this visit.   Subjective Assessment - 11/20/19 0944    Subjective Patient reports compliance with HEP. No falls or LOB since last session. No complaints of pain.    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 Patient has attended OP PT in the past year for LLE  strengthening and balance with positive results, but has not been in attendance recently due to COVID-19 pandemic. PMH includes osteoporosis, anemia, celiac disease, hyperlipidemia, hx of R upper lobe mass with resection 6/13, hx of pneumonia, meningioma, major atherosclerosis. VP shunt has been placed 06/2017.    Limitations Lifting;House hold activities;Standing;Walking    How long can you sit comfortably? n/a    How long can you stand comfortably? ~10 minutes (AM better than PM)    How long can you walk comfortably? a few mins until standing rest break with LLE off ground    Patient Stated Goals "to walk by myself"    Currently in Pain? No/denies               TherEx:     Scapular retractions 15x  GTB row 15x; very challenging LUE   Seated RTB marching, cueing for keeping knees apart. 20x, upright posture, challenge to not lean back.    Seated RTB abduction 15x    Seated RTB hamstring curl 15x each LE    Seated green ball adduction cues for squeezing with LLE, 3 second holds, 15x    Neuro Re-ed Static stand no UE support horizontal head turns 30 seconds, vertical heads turns 30 seconds    6" step holding foot on each surface, 30 seconds  holds each LE, 2 sets each LE   Speed ladder for foot placement and foot clearance 12x length of // bars. BUE support, challenging for LUE to not drag causing patient to lose balance laterally and posteriorly     Orange hurdle step over and back 12x each LE, BUE support  Soccer ball LAQ with focus on muscle recruitment timing with coordination and spatial awareness x 2 minutes   Gait:  Ambulate with rollator and close CGA, cues for decreasing and increasing velocity of ambulation, very challenging for slow ambulation with decreasing foot clearance bilaterally. 200 ft    Pt educated throughout session about proper posture and technique with exercises. Improved exercise technique, movement at target joints, use of target muscles after min  to mod verbal, visual, tactile cues.                        PT Education - 11/20/19 0926    Education provided Yes    Education Details exercise technique, body mechanics    Person(s) Educated Patient    Methods Explanation;Demonstration;Tactile cues;Verbal cues    Comprehension Verbalized understanding;Returned demonstration;Verbal cues required;Tactile cues required            PT Short Term Goals - 10/20/19 1646      PT SHORT TERM GOAL #1   Title Patient will be independent in HEP demonstrating successful between session carryover and improve functional mobility and ease of completing ADLs.    Baseline 10/20/19: HEP not administered at evaluation    Time 4    Period Weeks    Status New    Target Date 11/17/19      PT SHORT TERM GOAL #2   Title Patient will increase ABC scale score >45% to demonstrate better functional mobility and better confidence with ADLs.    Baseline 10/20/19: 36%    Time 4    Period Weeks    Status New    Target Date 11/17/19             PT Long Term Goals - 10/20/19 1649      PT LONG TERM GOAL #1   Title Patient will increase FOTO score to equal to or greater than 49% to demonstrate statistically significant improvement in mobility and quality of life.    Baseline 10/20/19: 32%    Time 8    Period Weeks    Status New    Target Date 12/15/19      PT LONG TERM GOAL #2   Title Patient (> 74 years old) will complete five times sit to stand test in < 15 seconds without UE support indicating an increased LE strength and improved balance.    Baseline 10/20/19: 26.14 seconds with BUE support required    Time 8    Period Weeks    Status New    Target Date 12/15/19      PT LONG TERM GOAL #3   Title Patient will demonstrate an improved Berg Balance Score of >42/56 with minimal need for BUE support as to demonstrate improved balance with ADLs such as sitting/standing and transfer balance and reduced fall risk.    Baseline 10/20/19:  28/56 with limitations due to fear of falling    Time 8    Period Weeks    Status New    Target Date 12/15/19      PT LONG TERM GOAL #4   Title Patient will increase 10 meter walk test to >1.64ms with  improved foot clearance and increased hip flexion in swing phase as to improve gait speed for better community ambulation and to reduce fall risk.    Baseline 10/20/19: 0.64ms with rollator, minimal hip flexion and foot clearance bilat    Time 8    Period Weeks    Status New    Target Date 12/15/19      PT LONG TERM GOAL #5   Title Patient will increase ABC scale score >60% to demonstrate better functional mobility and better confidence with ADLs.    Baseline 10/20/19: 36%    Time 8    Period Weeks    Status New    Target Date 12/15/19                 Plan - 11/20/19 1009    Clinical Impression Statement Patient is highly motivated throughout physical therapy session. She continues to remain highly fearful of losing balance and requires maximal encouragement for reduction of support. LLE and LUE are limited in spatial awareness and require additional time to task performance .Patient will benefit from skilled PT to address balance, strength and coordination deficits in order to maximize functional independence in the home.    Personal Factors and Comorbidities Time since onset of injury/illness/exacerbation;Age;Comorbidity 3+;Past/Current Experience;Transportation    Comorbidities osteoporosis, anemia, hx cancer, hyperlipidemia    Examination-Activity Limitations Bathing;Bed Mobility;Dressing;Reach Overhead;Stairs;Stand;Toileting;Locomotion Level;Squat;Transfers;Bend;Hygiene/Grooming;Carry;Sit    Examination-Participation Restrictions Community Activity;Driving;Shop;Yard Work;Cleaning;Meal Prep    Stability/Clinical Decision Making Evolving/Moderate complexity    Rehab Potential Good    PT Frequency 2x / week    PT Duration 8 weeks    PT Treatment/Interventions ADLs/Self Care  Home Management;Aquatic Therapy;Cryotherapy;Electrical Stimulation;DME Instruction;Gait training;Stair training;Functional mobility training;Therapeutic activities;Therapeutic exercise;Balance training;Neuromuscular re-education;Patient/family education;Manual techniques;Passive range of motion;Dry needling;Spinal Manipulations;Joint Manipulations;Orthotic Fit/Training;Energy conservation    PT Next Visit Plan assess hamstring and quadricep flexibility, balance and BLE strength training, gait training, give HEP    PT Home Exercise Plan not administered this date    Consulted and Agree with Plan of Care Patient           Patient will benefit from skilled therapeutic intervention in order to improve the following deficits and impairments:  Abnormal gait, Decreased balance, Decreased mobility, Difficulty walking, Hypomobility, Decreased strength, Decreased knowledge of use of DME, Decreased coordination, Postural dysfunction, Impaired flexibility, Improper body mechanics, Impaired perceived functional ability, Decreased activity tolerance  Visit Diagnosis: Unsteadiness on feet  Muscle weakness (generalized)  Abnormality of gait and mobility     Problem List Patient Active Problem List   Diagnosis Date Noted  . Cognitive deficits   . Labile blood pressure   . Acute blood loss anemia   . Slow transit constipation   . Vascular headache   . 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 Hydrocephalus ([place] 07/03/2017  . 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 Pelvic fracture ([place] 04/18/2016  . CD (celiac disease) 08/04/2014  . Cancer of upper lobe of right lung ([place] 08/04/2014  . Age related osteoporosis 11/26/2013  . Absolute anemia 05/21/2013  . H/O malignant neoplasm 05/21/2013  . HLD (hyperlipidemia) 05/21/2013   MJanna Arch PT, DPT   11/20/2019, 10:15 AM  [place] [facility] HospitalSERVICES [address]  [address]Phone: [phone]  Fax:  [phone] Name: MJAIDAN STACHNIKMRN: [id number] of Birth: 320-Feb-1947

## 2019-11-24 ENCOUNTER — Other Ambulatory Visit: Payer: Self-pay

## 2019-11-24 ENCOUNTER — Ambulatory Visit: Payer: PPO

## 2019-11-24 DIAGNOSIS — R2681 Unsteadiness on feet: Secondary | ICD-10-CM | POA: Diagnosis not present

## 2019-11-24 DIAGNOSIS — R269 Unspecified abnormalities of gait and mobility: Secondary | ICD-10-CM

## 2019-11-24 DIAGNOSIS — M6281 Muscle weakness (generalized): Secondary | ICD-10-CM

## 2019-11-24 NOTE — Therapy (Signed)
[place] MAIN [facility] SERVICES [address] [place], [place], [address] Phone: [phone]   Fax:  [phone]  Physical Therapy Treatment Physical Therapy Progress Note   Dates of reporting period  10/20/19  to   11/24/19  Patient Details  Name: Krista Evans MRN: [id number] Date of Birth: 01-01-1946 Referring Provider (PT): Ramonita Lab   Encounter Date: 11/24/2019   PT End of Session - 11/24/19 1246    Visit Number 10    Number of Visits 17    Date for PT Re-Evaluation 12/15/19    Authorization Type 10/10 eval 10/20/19; next session 1/10 PN 11/24/19    PT Start Time 1100    PT Stop Time 1144    PT Time Calculation (min) 44 min    Equipment Utilized During Treatment Gait belt    Activity Tolerance Patient tolerated treatment well;Patient limited by fatigue    Behavior During Therapy WFL for tasks assessed/performed           Past Medical History:  Diagnosis Date  . Abnormal Q waves on electrocardiogram   . Anemia   . Aortic atherosclerosis ([place])    "I could possibly have it, my grandmother had it"  . Cancer [facility]) 05/2011   Right upper Lung CA with partial Lobectomy.  . Celiac disease   . Celiac disease   . Dyspnea    with exertion  . Hyperlipidemia   . Hyperlipidemia   . Lung mass   . Meningioma ([place])   . Osteoporosis   . Personal history of chemotherapy   . Personal history of radiation therapy     Past Surgical History:  Procedure Laterality Date  . LUNG LOBECTOMY     right lung  . 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   Subjective Assessment - 11/24/19 1121    Subjective Patient reports compliance with HEP.  Wants to continue getting stronger and steadier. No falls or LOB since last session.    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 Patient has attended OP PT in the past year for LLE strengthening and balance with positive results, but has not been in attendance recently due to COVID-19 pandemic. PMH includes osteoporosis, anemia, celiac disease, hyperlipidemia, hx of R upper lobe mass with resection 6/13, hx of pneumonia, meningioma, major atherosclerosis. VP shunt has been placed 06/2017.    Limitations Lifting;House hold activities;Standing;Walking    How long can you sit comfortably? n/a    How long can you stand comfortably? ~10 minutes (AM better than PM)    How long can you walk comfortably? a few mins until standing rest break with LLE off ground    Patient Stated Goals "to walk by myself"    Currently in Pain? No/denies              [facility] PT Assessment - 11/24/19 0001      Standardized Balance Assessment   Standardized Balance Assessment Berg Balance Test      Berg Balance Test   Sit to Stand Able to stand  independently using hands    Standing Unsupported Able to stand 2 minutes with supervision    Sitting with Back Unsupported but Feet Supported on Floor or Stool Able to sit safely and  securely 2 minutes    Stand to Sit Controls descent by using hands    Transfers Able to transfer safely, definite need of hands    Standing Unsupported with Eyes Closed Able to stand 10 seconds with supervision    Standing Unsupported with Feet Together Able to place feet together independently but unable to hold for 30 seconds    From Standing, Reach Forward with Outstretched Arm Can reach forward >5 cm safely (2")    From Standing Position, Pick up Object from Floor Unable to pick up shoe, but reaches 2-5 cm (1-2") from shoe and balances independently    From Standing Position, Turn to Look Behind Over each Shoulder Turn sideways only but maintains balance    Turn 360 Degrees Needs close supervision or verbal cueing    Standing  Unsupported, Alternately Place Feet on Step/Stool Able to complete >2 steps/needs minimal assist    Standing Unsupported, One Foot in Front Able to take small step independently and hold 30 seconds    Standing on One Leg Unable to try or needs assist to prevent fall    Total Score 31           Goals:  HEP  ABC: 43.125%  FOTO: 57.9%  5x STS: 22.43 seconds with heavy BUE support BERG: 31/ 56  10 MWT: first trial 12.22 seconds, second trial 12.38 seconds =0.81 m/s   Treatment:  Ambulate with rollator 160 ft with cues for knee flexion and foot clearance. Min A for equal pushing of UE's on rollator.   Standing with # 2.5 lb ankle weight: CGA for stability  -Hip extension with BUE upper extremity support, cueing for neutral hip alignment, upright posture for optimal muscle recruitment, and sequencing, 10x each LE,  -Hip abduction with BUe  upper extremity support, cueing for neutral foot alignment for correct muscle activation, 10x each LE -Hip flexion with BUE upper extremity support, cueing for body mechanics, speed of muscle recruitment for optimal strengthening and stabilization 10x each LE    Seated with # 2.5 lb ankle weights  -Seated marches with upright posture, back away from back of chair for abdominal/trunk activation/stabilization, 10x each LE -Seated LAQ with 3 second holds, 10x each LE, cueing for muscle activation and sequencing for neutral alignment -Seated IR/ER with cueing for stabilizing knee placement with lateral foot movement for optimal muscle recruitment, 10x each LE   Patient's condition has the potential to improve in response to therapy. Maximum improvement is yet to be obtained. The anticipated improvement is attainable and reasonable in a generally predictable time.  Patient reports she is starting to feel more confident in her movements however still wants to be stronger and steadier.   Patient is progressing towards functional goals with improved gait  speed and transfer speed. Patient continues to rely heavily on UE support due to fear of LOB however her balance is improving as can be seen as BERG score.  Patient's condition has the potential to improve in response to therapy. Maximum improvement is yet to be obtained. The anticipated improvement is attainable and reasonable in a generally predictable time. Patient will benefit from skilled PT to address balance, strength and coordination deficits in order to maximize functional independence in the home.                        PT Education - 11/24/19 1138    Education provided Yes    Education Details goals, exercise technique, body  mechanics, foot clearance.    Person(s) Educated Patient    Methods Explanation;Demonstration;Tactile cues;Verbal cues    Comprehension Verbalized understanding;Returned demonstration;Verbal cues required;Tactile cues required            PT Short Term Goals - 11/24/19 1117      PT SHORT TERM GOAL #1   Title Patient will be independent in HEP demonstrating successful between session carryover and improve functional mobility and ease of completing ADLs.    Baseline 10/20/19: HEP not administered at evaluation 11/8: HEP compliant    Time 4    Period Weeks    Status Achieved    Target Date 11/17/19      PT SHORT TERM GOAL #2   Title Patient will increase ABC scale score >45% to demonstrate better functional mobility and better confidence with ADLs.    Baseline 10/20/19: 36% 11/8: 43%    Time 4    Period Weeks    Status Partially Met    Target Date 11/17/19             PT Long Term Goals - 11/24/19 1115      PT LONG TERM GOAL #1   Title Patient will increase FOTO score to equal to or greater than 49% to demonstrate statistically significant improvement in mobility and quality of life.    Baseline 10/20/19: 32%    Time 8    Period Weeks    Status New      PT LONG TERM GOAL #2   Title Patient (> 16 years old) will complete five  times sit to stand test in < 15 seconds without UE support indicating an increased LE strength and improved balance.    Baseline 10/20/19: 26.14 seconds with BUE support required 11/8: 22. 43 seconds with heavy BUE support    Time 8    Period Weeks    Status Partially Met    Target Date 12/15/19      PT LONG TERM GOAL #3   Title Patient will demonstrate an improved Berg Balance Score of >42/56 with minimal need for BUE support as to demonstrate improved balance with ADLs such as sitting/standing and transfer balance and reduced fall risk.    Baseline 10/20/19: 28/56 with limitations due to fear of falling 11/8: 31/56    Time 8    Period Weeks    Status Partially Met    Target Date 12/15/19      PT LONG TERM GOAL #4   Title Patient will increase 10 meter walk test to >1.34ms with improved foot clearance and increased hip flexion in swing phase as to improve gait speed for better community ambulation and to reduce fall risk.    Baseline 10/20/19: 0.664m with rollator, minimal hip flexion and foot clearance bilat 11/8: 0.81 m/s with rollator    Time 8    Period Weeks    Status Partially Met    Target Date 12/15/19      PT LONG TERM GOAL #5   Title Patient will increase ABC scale score >60% to demonstrate better functional mobility and better confidence with ADLs.    Baseline 10/20/19: 36% 11/8: 43%    Time 8    Period Weeks    Status Partially Met    Target Date 12/15/19                 Plan - 11/24/19 1249    Clinical Impression Statement Patient is progressing towards functional goals with improved gait speed and  transfer speed. Patient continues to rely heavily on UE support due to fear of LOB however her balance is improving as can be seen as BERG score.  Patient's condition has the potential to improve in response to therapy. Maximum improvement is yet to be obtained. The anticipated improvement is attainable and reasonable in a generally predictable time. Patient will benefit  from skilled PT to address balance, strength and coordination deficits in order to maximize functional independence in the home.    Personal Factors and Comorbidities Time since onset of injury/illness/exacerbation;Age;Comorbidity 3+;Past/Current Experience;Transportation    Comorbidities osteoporosis, anemia, hx cancer, hyperlipidemia    Examination-Activity Limitations Bathing;Bed Mobility;Dressing;Reach Overhead;Stairs;Stand;Toileting;Locomotion Level;Squat;Transfers;Bend;Hygiene/Grooming;Carry;Sit    Examination-Participation Restrictions Community Activity;Driving;Shop;Yard Work;Cleaning;Meal Prep    Stability/Clinical Decision Making Evolving/Moderate complexity    Rehab Potential Good    PT Frequency 2x / week    PT Duration 8 weeks    PT Treatment/Interventions ADLs/Self Care Home Management;Aquatic Therapy;Cryotherapy;Electrical Stimulation;DME Instruction;Gait training;Stair training;Functional mobility training;Therapeutic activities;Therapeutic exercise;Balance training;Neuromuscular re-education;Patient/family education;Manual techniques;Passive range of motion;Dry needling;Spinal Manipulations;Joint Manipulations;Orthotic Fit/Training;Energy conservation    PT Next Visit Plan assess hamstring and quadricep flexibility, balance and BLE strength training, gait training, give HEP    PT Home Exercise Plan not administered this date    Consulted and Agree with Plan of Care Patient           Patient will benefit from skilled therapeutic intervention in order to improve the following deficits and impairments:  Abnormal gait, Decreased balance, Decreased mobility, Difficulty walking, Hypomobility, Decreased strength, Decreased knowledge of use of DME, Decreased coordination, Postural dysfunction, Impaired flexibility, Improper body mechanics, Impaired perceived functional ability, Decreased activity tolerance  Visit Diagnosis: Unsteadiness on feet  Muscle weakness  (generalized)  Abnormality of gait and mobility  Neurologic gait disorder     Problem List Patient Active Problem List   Diagnosis Date Noted  . Cognitive deficits   . Labile blood pressure   . Acute blood loss anemia   . Slow transit constipation   . Vascular headache   . 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 Hydrocephalus ([place]) 07/03/2017  . 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 Pelvic fracture ([place]) 04/18/2016  . CD (celiac disease) 08/04/2014  . Cancer of upper lobe of right lung ([place]) 08/04/2014  . Age related osteoporosis 11/26/2013  . Absolute anemia 05/21/2013  . H/O malignant neoplasm 05/21/2013  . HLD (hyperlipidemia) 05/21/2013   Janna Arch, PT, DPT   11/24/2019, 12:50 PM  [place] MAIN [facility] SERVICES [address] [place], [place], [address] Phone: [phone]   Fax:  [phone]  Name: Krista Evans MRN: [id number] Date of Birth: [birth year]

## 2019-11-26 ENCOUNTER — Other Ambulatory Visit: Payer: Self-pay

## 2019-11-26 ENCOUNTER — Ambulatory Visit: Payer: PPO

## 2019-11-26 DIAGNOSIS — M6281 Muscle weakness (generalized): Secondary | ICD-10-CM

## 2019-11-26 DIAGNOSIS — R269 Unspecified abnormalities of gait and mobility: Secondary | ICD-10-CM

## 2019-11-26 DIAGNOSIS — R2681 Unsteadiness on feet: Secondary | ICD-10-CM | POA: Diagnosis not present

## 2019-11-26 NOTE — Therapy (Signed)
[place] MAIN [facility] SERVICES [address] [place], [place], [address] Phone: [phone]   Fax:  [phone]  Physical Therapy Treatment  Patient Details  Name: Krista Evans MRN: [id number] Date of Birth: 02/27/1945 Referring Provider (PT): Ramonita Lab   Encounter Date: 11/26/2019   PT End of Session - 11/26/19 1106    Visit Number 11    Number of Visits 17    Date for PT Re-Evaluation 12/15/19    Authorization Type 1/10 PN 11/24/19    PT Start Time 1100    PT Stop Time 1144    PT Time Calculation (min) 44 min    Equipment Utilized During Treatment Gait belt    Activity Tolerance Patient tolerated treatment well;Patient limited by fatigue    Behavior During Therapy [facility] for tasks assessed/performed           Past Medical History:  Diagnosis Date  . Abnormal Q waves on electrocardiogram   . Anemia   . Aortic atherosclerosis ([place])    "I could possibly have it, my grandmother had it"  . Cancer [facility]) 05/2011   Right upper Lung CA with partial Lobectomy.  . Celiac disease   . Celiac disease   . Dyspnea    with exertion  . Hyperlipidemia   . Hyperlipidemia   . Lung mass   . Meningioma ([place])   . Osteoporosis   . Personal history of chemotherapy   . Personal history of radiation therapy     Past Surgical History:  Procedure Laterality Date  . LUNG LOBECTOMY     right lung  . VENTRICULOPERITONEAL SHUNT Right 07/03/2017   Procedure: SHUNT INSERTION VENTRICULAR-PERITONEAL;  Surgeon: Newman Pies, MD;  Location: [place];  Service: Neurosurgery;  Laterality: Right;    There were no vitals filed for this visit.   Subjective Assessment - 11/26/19 1104    Subjective Patient reports she hasn't been outside to enjoy the nice weather. No falls or LOB since last session.    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 Patient has attended OP PT in the past year  for LLE strengthening and balance with positive results, but has not been in attendance recently due to COVID-19 pandemic. PMH includes osteoporosis, anemia, celiac disease, hyperlipidemia, hx of R upper lobe mass with resection 6/13, hx of pneumonia, meningioma, major atherosclerosis. VP shunt has been placed 06/2017.    Limitations Lifting;House hold activities;Standing;Walking    How long can you sit comfortably? n/a    How long can you stand comfortably? ~10 minutes (AM better than PM)    How long can you walk comfortably? a few mins until standing rest break with LLE off ground    Patient Stated Goals "to walk by myself"    Currently in Pain? No/denies                TherEx:     Scapular retractions 15x   Sit to stand with overhead reach after pressing with arms of chair 10x, very fatiguing with repetition    Neuro Re-ed   Speed ladder for foot placement and foot clearance 12x length of // bars. BUE support, challenging for LUE to not drag causing patient to lose balance laterally and posteriorly    Ambulate with SUE to no UE support 6x length of // bars; improved gait mechanics with decreased support  Step over theraband obstacle clap, then step back 10x each  LE ; challenging to LLE.    Ambulate with rollator and close CGA, cues for decreasing and increasing velocity of ambulation, very challenging for slow ambulation with decreasing foot clearance bilaterally. 180 ft   ambulate with single HHA 50 ft x 2 trials. Cues for increasing knee flexion and foot clearance for decreased shuffle steppage.     Pt educated throughout session about proper posture and technique with exercises. Improved exercise technique, movement at target joints, use of target muscles after min to mod verbal, visual, tactile cues                      PT Education - 11/26/19 1105    Education provided Yes    Education Details exercise technique, body mechanics, foot clearance.     Person(s) Educated Patient    Methods Explanation;Demonstration;Tactile cues;Verbal cues    Comprehension Verbalized understanding;Returned demonstration;Verbal cues required;Tactile cues required            PT Short Term Goals - 11/24/19 1117      PT SHORT TERM GOAL #1   Title Patient will be independent in HEP demonstrating successful between session carryover and improve functional mobility and ease of completing ADLs.    Baseline 10/20/19: HEP not administered at evaluation 11/8: HEP compliant    Time 4    Period Weeks    Status Achieved    Target Date 11/17/19      PT SHORT TERM GOAL #2   Title Patient will increase ABC scale score >45% to demonstrate better functional mobility and better confidence with ADLs.    Baseline 10/20/19: 36% 11/8: 43%    Time 4    Period Weeks    Status Partially Met    Target Date 11/17/19             PT Long Term Goals - 11/24/19 1115      PT LONG TERM GOAL #1   Title Patient will increase FOTO score to equal to or greater than 49% to demonstrate statistically significant improvement in mobility and quality of life.    Baseline 10/20/19: 32%    Time 8    Period Weeks    Status New      PT LONG TERM GOAL #2   Title Patient (> 57 years old) will complete five times sit to stand test in < 15 seconds without UE support indicating an increased LE strength and improved balance.    Baseline 10/20/19: 26.14 seconds with BUE support required 11/8: 22. 43 seconds with heavy BUE support    Time 8    Period Weeks    Status Partially Met    Target Date 12/15/19      PT LONG TERM GOAL #3   Title Patient will demonstrate an improved Berg Balance Score of >42/56 with minimal need for BUE support as to demonstrate improved balance with ADLs such as sitting/standing and transfer balance and reduced fall risk.    Baseline 10/20/19: 28/56 with limitations due to fear of falling 11/8: 31/56    Time 8    Period Weeks    Status Partially Met    Target Date  12/15/19      PT LONG TERM GOAL #4   Title Patient will increase 10 meter walk test to >1.26ms with improved foot clearance and increased hip flexion in swing phase as to improve gait speed for better community ambulation and to reduce fall risk.    Baseline 10/20/19: 0.632m with  rollator, minimal hip flexion and foot clearance bilat 11/8: 0.81 m/s with rollator    Time 8    Period Weeks    Status Partially Met    Target Date 12/15/19      PT LONG TERM GOAL #5   Title Patient will increase ABC scale score >60% to demonstrate better functional mobility and better confidence with ADLs.    Baseline 10/20/19: 36% 11/8: 43%    Time 8    Period Weeks    Status Partially Met    Target Date 12/15/19                 Plan - 11/26/19 1258    Clinical Impression Statement Patient is improving with gait mechanics with decreasing UE support. Patient challenged by fear of LOB more so than actual LOB at this time, as she becomes less fearful she is able to perform improved step mechanics and mobility. Patient will benefit from skilled PT to address balance, strength and coordination deficits in order to maximize functional independence in the home.    Personal Factors and Comorbidities Time since onset of injury/illness/exacerbation;Age;Comorbidity 3+;Past/Current Experience;Transportation    Comorbidities osteoporosis, anemia, hx cancer, hyperlipidemia    Examination-Activity Limitations Bathing;Bed Mobility;Dressing;Reach Overhead;Stairs;Stand;Toileting;Locomotion Level;Squat;Transfers;Bend;Hygiene/Grooming;Carry;Sit    Examination-Participation Restrictions Community Activity;Driving;Shop;Yard Work;Cleaning;Meal Prep    Stability/Clinical Decision Making Evolving/Moderate complexity    Rehab Potential Good    PT Frequency 2x / week    PT Duration 8 weeks    PT Treatment/Interventions ADLs/Self Care Home Management;Aquatic Therapy;Cryotherapy;Electrical Stimulation;DME Instruction;Gait  training;Stair training;Functional mobility training;Therapeutic activities;Therapeutic exercise;Balance training;Neuromuscular re-education;Patient/family education;Manual techniques;Passive range of motion;Dry needling;Spinal Manipulations;Joint Manipulations;Orthotic Fit/Training;Energy conservation    PT Next Visit Plan assess hamstring and quadricep flexibility, balance and BLE strength training, gait training, give HEP    PT Home Exercise Plan not administered this date    Consulted and Agree with Plan of Care Patient           Patient will benefit from skilled therapeutic intervention in order to improve the following deficits and impairments:  Abnormal gait, Decreased balance, Decreased mobility, Difficulty walking, Hypomobility, Decreased strength, Decreased knowledge of use of DME, Decreased coordination, Postural dysfunction, Impaired flexibility, Improper body mechanics, Impaired perceived functional ability, Decreased activity tolerance  Visit Diagnosis: Unsteadiness on feet  Muscle weakness (generalized)  Abnormality of gait and mobility     Problem List Patient Active Problem List   Diagnosis Date Noted  . Cognitive deficits   . Labile blood pressure   . Acute blood loss anemia   . Slow transit constipation   . Vascular headache   . 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 Hydrocephalus ([place]) 07/03/2017  . 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 Pelvic fracture ([place]) 04/18/2016  . CD (celiac disease) 08/04/2014  . Cancer of upper lobe of right lung ([place]) 08/04/2014  . Age related osteoporosis 11/26/2013  . Absolute anemia 05/21/2013  . H/O malignant neoplasm 05/21/2013  . HLD (hyperlipidemia) 05/21/2013   Janna Arch, PT, DPT   11/26/2019, 12:59 PM  [place] MAIN [facility] SERVICES [address] [place], [place], [address] Phone: [phone]   Fax:  [phone]  Name: Krista Evans MRN: [id number] Date of  Birth: [birth year]

## 2019-11-28 ENCOUNTER — Other Ambulatory Visit: Payer: Self-pay | Admitting: Neurosurgery

## 2019-11-28 ENCOUNTER — Other Ambulatory Visit (HOSPITAL_COMMUNITY): Payer: Self-pay | Admitting: Neurosurgery

## 2019-11-28 DIAGNOSIS — R531 Weakness: Secondary | ICD-10-CM | POA: Diagnosis not present

## 2019-12-01 ENCOUNTER — Ambulatory Visit: Payer: PPO

## 2019-12-01 ENCOUNTER — Other Ambulatory Visit: Payer: Self-pay

## 2019-12-01 DIAGNOSIS — M6281 Muscle weakness (generalized): Secondary | ICD-10-CM

## 2019-12-01 DIAGNOSIS — R269 Unspecified abnormalities of gait and mobility: Secondary | ICD-10-CM

## 2019-12-01 DIAGNOSIS — R2681 Unsteadiness on feet: Secondary | ICD-10-CM

## 2019-12-01 NOTE — Therapy (Signed)
[place] MAIN [facility] SERVICES [address] [place], [place], [address] Phone: [phone]   Fax:  [phone]  Physical Therapy Treatment  Patient Details  Name: Krista Evans MRN: [id number] Date of Birth: Feb 19, 1945 Referring Provider (PT): Ramonita Lab   Encounter Date: 12/01/2019   PT End of Session - 12/01/19 1107    Visit Number 12    Number of Visits 17    Date for PT Re-Evaluation 12/15/19    Authorization Type 2/10 PN 11/24/19    PT Start Time 1100    PT Stop Time 1144    PT Time Calculation (min) 44 min    Equipment Utilized During Treatment Gait belt    Activity Tolerance Patient tolerated treatment well;Patient limited by fatigue    Behavior During Therapy WFL for tasks assessed/performed           Past Medical History:  Diagnosis Date   Abnormal Q waves on electrocardiogram    Anemia    Aortic atherosclerosis ([place])    "I could possibly have it, my grandmother had it"   Cancer ([place]) 05/2011   Right upper Lung CA with partial Lobectomy.   Celiac disease    Celiac disease    Dyspnea    with exertion   Hyperlipidemia    Hyperlipidemia    Lung mass    Meningioma (HCC)    Osteoporosis    Personal history of chemotherapy    Personal history of radiation therapy     Past Surgical History:  Procedure Laterality Date   LUNG LOBECTOMY     right lung   VENTRICULOPERITONEAL SHUNT Right 07/03/2017   Procedure: SHUNT INSERTION VENTRICULAR-PERITONEAL;  Surgeon: Newman Pies, MD;  Location: [place];  Service: Neurosurgery;  Laterality: Right;    There were no vitals filed for this visit.   Subjective Assessment - 12/01/19 1106    Subjective Patient reports no falls or LOB since last session. Reports doing some HEP over the weekend. Reports no pain.    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 Patient has attended OP PT in the past  year for LLE strengthening and balance with positive results, but has not been in attendance recently due to COVID-19 pandemic. PMH includes osteoporosis, anemia, celiac disease, hyperlipidemia, hx of R upper lobe mass with resection 6/13, hx of pneumonia, meningioma, major atherosclerosis. VP shunt has been placed 06/2017.    Limitations Lifting;House hold activities;Standing;Walking    How long can you sit comfortably? n/a    How long can you stand comfortably? ~10 minutes (AM better than PM)    How long can you walk comfortably? a few mins until standing rest break with LLE off ground    Patient Stated Goals "to walk by myself"    Currently in Pain? No/denies              TherEx:    Nustep Lvl 3 RPM> 60 for cardiovascular and musculoskeletal challenge x 3 minutes   2.5 ankle weight:  -standing marches with BUE support 12x each LE -standing hip extension with BUE support 10x each LE   Sit to stand with overhead reach after pressing with arms of chair 10x, very fatiguing with repetition    Neuro Re-ed  step over orange hurdle both feet, 10x each LE placement with BUE support   High knee march with RUE support only, 4x length of // bars with max cueing for  sequencing and hip flexion with knee flexion of LLE.   Speed ladder for foot placement and foot clearance 12x length of // bars. BUE support, challenging for LUE to not drag causing patient to lose balance laterally and posteriorly     Ambulate with SUE to no UE support 6x length of // bars; improved gait mechanics with decreased support   aiex pad: reach to right outside BOS grab a ball, stabilize into COM and then toss into hoop x 20 balls, SUE support  Step over theraband obstacle clap, then step back 10x each LE ; challenging to LLE.         Pt educated throughout session about proper posture and technique with exercises. Improved exercise technique, movement at target joints, use of target muscles after min to mod verbal,  visual, tactile cues     Access Code: [email or web address] URL: [email or web address] Date: 12/01/2019 Prepared by: Janna Arch  Exercises   Sit to Stand - 1 x daily - 7 x weekly - 2 sets - 10 reps - 5 hold  Standing March with Counter Support - 1 x daily - 7 x weekly - 2 sets - 20 reps - 5 hold  Seated Long Arc Quad - 1 x daily - 7 x weekly - 2 sets - 10 reps - 5 hold  Standing Forward Step Taps with Counter Support - 1 x daily - 7 x weekly - 2 sets - 10 reps - 5 hold                      PT Education - 12/01/19 1107    Education provided Yes    Education Details exercise technique, body mechanics, foot clearance    Person(s) Educated Patient    Methods Explanation;Demonstration;Tactile cues;Verbal cues    Comprehension Verbalized understanding;Returned demonstration;Verbal cues required;Tactile cues required            PT Short Term Goals - 11/24/19 1117      PT SHORT TERM GOAL #1   Title Patient will be independent in HEP demonstrating successful between session carryover and improve functional mobility and ease of completing ADLs.    Baseline 10/20/19: HEP not administered at evaluation 11/8: HEP compliant    Time 4    Period Weeks    Status Achieved    Target Date 11/17/19      PT SHORT TERM GOAL #2   Title Patient will increase ABC scale score >45% to demonstrate better functional mobility and better confidence with ADLs.    Baseline 10/20/19: 36% 11/8: 43%    Time 4    Period Weeks    Status Partially Met    Target Date 11/17/19             PT Long Term Goals - 11/24/19 1115      PT LONG TERM GOAL #1   Title Patient will increase FOTO score to equal to or greater than 49% to demonstrate statistically significant improvement in mobility and quality of life.    Baseline 10/20/19: 32%    Time 8    Period Weeks    Status New      PT LONG TERM GOAL #2   Title Patient (> 37 years old) will complete five times sit to stand test in <  15 seconds without UE support indicating an increased LE strength and improved balance.    Baseline 10/20/19: 26.14 seconds with BUE support required 11/8: 22. 43 seconds with heavy BUE support  Time 8    Period Weeks    Status Partially Met    Target Date 12/15/19      PT LONG TERM GOAL #3   Title Patient will demonstrate an improved Berg Balance Score of >42/56 with minimal need for BUE support as to demonstrate improved balance with ADLs such as sitting/standing and transfer balance and reduced fall risk.    Baseline 10/20/19: 28/56 with limitations due to fear of falling 11/8: 31/56    Time 8    Period Weeks    Status Partially Met    Target Date 12/15/19      PT LONG TERM GOAL #4   Title Patient will increase 10 meter walk test to >1.18ms with improved foot clearance and increased hip flexion in swing phase as to improve gait speed for better community ambulation and to reduce fall risk.    Baseline 10/20/19: 0.645m with rollator, minimal hip flexion and foot clearance bilat 11/8: 0.81 m/s with rollator    Time 8    Period Weeks    Status Partially Met    Target Date 12/15/19      PT LONG TERM GOAL #5   Title Patient will increase ABC scale score >60% to demonstrate better functional mobility and better confidence with ADLs.    Baseline 10/20/19: 36% 11/8: 43%    Time 8    Period Weeks    Status Partially Met    Target Date 12/15/19                 Plan - 12/01/19 1230    Clinical Impression Statement Patient presents with excellent motivation throughout physical therapy session. She remains fearful of single limb stability as well as decreasing UE support however is able to progress past fear with max encouragement and cues for verbalization of movement. Patient is able to march with SUE support without LOB with max cueing for first time. Patient will benefit from skilled PT to address balance, strength and coordination deficits in order to maximize functional  independence in the home.    Personal Factors and Comorbidities Time since onset of injury/illness/exacerbation;Age;Comorbidity 3+;Past/Current Experience;Transportation    Comorbidities osteoporosis, anemia, hx cancer, hyperlipidemia    Examination-Activity Limitations Bathing;Bed Mobility;Dressing;Reach Overhead;Stairs;Stand;Toileting;Locomotion Level;Squat;Transfers;Bend;Hygiene/Grooming;Carry;Sit    Examination-Participation Restrictions Community Activity;Driving;Shop;Yard Work;Cleaning;Meal Prep    Stability/Clinical Decision Making Evolving/Moderate complexity    Rehab Potential Good    PT Frequency 2x / week    PT Duration 8 weeks    PT Treatment/Interventions ADLs/Self Care Home Management;Aquatic Therapy;Cryotherapy;Electrical Stimulation;DME Instruction;Gait training;Stair training;Functional mobility training;Therapeutic activities;Therapeutic exercise;Balance training;Neuromuscular re-education;Patient/family education;Manual techniques;Passive range of motion;Dry needling;Spinal Manipulations;Joint Manipulations;Orthotic Fit/Training;Energy conservation    PT Next Visit Plan assess hamstring and quadricep flexibility, balance and BLE strength training, gait training, give HEP    PT Home Exercise Plan not administered this date    Consulted and Agree with Plan of Care Patient           Patient will benefit from skilled therapeutic intervention in order to improve the following deficits and impairments:  Abnormal gait, Decreased balance, Decreased mobility, Difficulty walking, Hypomobility, Decreased strength, Decreased knowledge of use of DME, Decreased coordination, Postural dysfunction, Impaired flexibility, Improper body mechanics, Impaired perceived functional ability, Decreased activity tolerance  Visit Diagnosis: Unsteadiness on feet  Muscle weakness (generalized)  Abnormality of gait and mobility     Problem List Patient Active Problem List   Diagnosis Date Noted    Cognitive deficits    Labile blood pressure  Acute blood loss anemia    Slow transit constipation    Vascular headache    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   Hydrocephalus ([place]) 07/03/2017   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

## 2019-12-03 ENCOUNTER — Other Ambulatory Visit: Payer: Self-pay

## 2019-12-03 ENCOUNTER — Ambulatory Visit: Payer: PPO

## 2019-12-03 DIAGNOSIS — M6281 Muscle weakness (generalized): Secondary | ICD-10-CM

## 2019-12-03 DIAGNOSIS — R269 Unspecified abnormalities of gait and mobility: Secondary | ICD-10-CM

## 2019-12-03 DIAGNOSIS — R2681 Unsteadiness on feet: Secondary | ICD-10-CM | POA: Diagnosis not present

## 2019-12-03 NOTE — Therapy (Signed)
[place] MAIN [facility] SERVICES [address] [place], [place], [address] Phone: [phone]   Fax:  [phone]  Physical Therapy Treatment  Patient Details  Name: Krista Evans MRN: [id number] Date of Birth: [birth year] Referring Provider (PT): Ramonita Lab   Encounter Date: 12/03/2019   PT End of Session - 12/03/19 1638    Visit Number 13    Number of Visits 17    Date for PT Re-Evaluation 12/15/19    Authorization Type 3/10 PN 11/24/19    PT Start Time 1116    PT Stop Time 1200    PT Time Calculation (min) 44 min    Equipment Utilized During Treatment Gait belt    Activity Tolerance Patient tolerated treatment well    Behavior During Therapy [facility] for tasks assessed/performed           Past Medical History:  Diagnosis Date  . Abnormal Q waves on electrocardiogram   . Anemia   . Aortic atherosclerosis ([place])    "I could possibly have it, my grandmother had it"  . Cancer [facility]) 05/2011   Right upper Lung CA with partial Lobectomy.  . Celiac disease   . Celiac disease   . Dyspnea    with exertion  . Hyperlipidemia   . Hyperlipidemia   . Lung mass   . Meningioma ([place])   . Osteoporosis   . Personal history of chemotherapy   . Personal history of radiation therapy     Past Surgical History:  Procedure Laterality Date  . LUNG LOBECTOMY     right lung  . VENTRICULOPERITONEAL SHUNT Right 07/03/2017   Procedure: SHUNT INSERTION VENTRICULAR-PERITONEAL;  Surgeon: Newman Pies, MD;  Location: [place];  Service: Neurosurgery;  Laterality: Right;    There were no vitals filed for this visit.   Subjective Assessment - 12/03/19 1633    Subjective Patient reports she has been doing well and has been compliant with HEP. Has not fallen but has been having occasional back pain. Reports no pain at this time.    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 Patient has  attended OP PT in the past year for LLE strengthening and balance with positive results, but has not been in attendance recently due to COVID-19 pandemic. PMH includes osteoporosis, anemia, celiac disease, hyperlipidemia, hx of R upper lobe mass with resection 6/13, hx of pneumonia, meningioma, major atherosclerosis. VP shunt has been placed 06/2017.    Limitations Lifting;House hold activities;Standing;Walking    How long can you sit comfortably? n/a    How long can you stand comfortably? ~10 minutes (AM better than PM)    How long can you walk comfortably? a few mins until standing rest break with LLE off ground    Patient Stated Goals "to walk by myself"    Currently in Pain? No/denies               Ambulate outside with RW to actively negotiate changing surfaces of sidewalk including concrete, brick, and stone as well as changing inclination with multiple dips, inclines, and declines. Ambulate >2000 ft with multiple seated rest breaks.   Decline ramp negotiation: min A to rollator with max cueing for continuation of ambulation and reduction of freezing due to fear of LOB. Patient improved with second repetition of 35 ft duration.   Incline ramp negotiation: able to stabilize rollator without assistance from PT however required max cueing for hip/knee  flexion for foot clearance and to reduce shuffle steps.     sit to stand from low bench: x 5 trials with attempt at decreasing hand usage by altering hand placement from bench to rollator, to half and half, one hand on PT, etc.   Negotiate obstacles on unstable surface, negotiate curvature of garden beds, weaving between beds and stepping over/through grates. x90 ft  Static stand with horizontal head turns and decreasing UE support x 65 seconds x 2 trials   Seated exercises: LAQ alternating 15x each LE Marching with upright posture 15x each LE   Pt educated throughout session about proper posture and technique with exercises. Improved  exercise technique, movement at target joints, use of target muscles after min to mod verbal, visual, tactile cues                      PT Education - 12/03/19 1637    Education provided Yes    Education Details exercise technique, body mechanics, foot clearance    Person(s) Educated Patient    Methods Explanation;Demonstration;Tactile cues;Verbal cues    Comprehension Verbalized understanding;Returned demonstration;Verbal cues required;Tactile cues required            PT Short Term Goals - 11/24/19 1117      PT SHORT TERM GOAL #1   Title Patient will be independent in HEP demonstrating successful between session carryover and improve functional mobility and ease of completing ADLs.    Baseline 10/20/19: HEP not administered at evaluation 11/8: HEP compliant    Time 4    Period Weeks    Status Achieved    Target Date 11/17/19      PT SHORT TERM GOAL #2   Title Patient will increase ABC scale score >45% to demonstrate better functional mobility and better confidence with ADLs.    Baseline 10/20/19: 36% 11/8: 43%    Time 4    Period Weeks    Status Partially Met    Target Date 11/17/19             PT Long Term Goals - 11/24/19 1115      PT LONG TERM GOAL #1   Title Patient will increase FOTO score to equal to or greater than 49% to demonstrate statistically significant improvement in mobility and quality of life.    Baseline 10/20/19: 32%    Time 8    Period Weeks    Status New      PT LONG TERM GOAL #2   Title Patient (> 65 years old) will complete five times sit to stand test in < 15 seconds without UE support indicating an increased LE strength and improved balance.    Baseline 10/20/19: 26.14 seconds with BUE support required 11/8: 22. 43 seconds with heavy BUE support    Time 8    Period Weeks    Status Partially Met    Target Date 12/15/19      PT LONG TERM GOAL #3   Title Patient will demonstrate an improved Berg Balance Score of >42/56 with  minimal need for BUE support as to demonstrate improved balance with ADLs such as sitting/standing and transfer balance and reduced fall risk.    Baseline 10/20/19: 28/56 with limitations due to fear of falling 11/8: 31/56    Time 8    Period Weeks    Status Partially Met    Target Date 12/15/19      PT LONG TERM GOAL #4   Title Patient  will increase 10 meter walk test to >1.65ms with improved foot clearance and increased hip flexion in swing phase as to improve gait speed for better community ambulation and to reduce fall risk.    Baseline 10/20/19: 0.631m with rollator, minimal hip flexion and foot clearance bilat 11/8: 0.81 m/s with rollator    Time 8    Period Weeks    Status Partially Met    Target Date 12/15/19      PT LONG TERM GOAL #5   Title Patient will increase ABC scale score >60% to demonstrate better functional mobility and better confidence with ADLs.    Baseline 10/20/19: 36% 11/8: 43%    Time 8    Period Weeks    Status Partially Met    Target Date 12/15/19                 Plan - 12/03/19 1643    Clinical Impression Statement Patient introduced to negotiation of unstable and changing surfaces with good results. She was challenged with decline negotiation and required min A for rollator and max cueing for reduction of freezing however improved with repetition indicating improved mobility when fear reduction is performed. Patient will benefit from skilled PT to address balance, strength and coordination deficits in order to maximize functional independence in the home.    Personal Factors and Comorbidities Time since onset of injury/illness/exacerbation;Age;Comorbidity 3+;Past/Current Experience;Transportation    Comorbidities osteoporosis, anemia, hx cancer, hyperlipidemia    Examination-Activity Limitations Bathing;Bed Mobility;Dressing;Reach Overhead;Stairs;Stand;Toileting;Locomotion Level;Squat;Transfers;Bend;Hygiene/Grooming;Carry;Sit     Examination-Participation Restrictions Community Activity;Driving;Shop;Yard Work;Cleaning;Meal Prep    Stability/Clinical Decision Making Evolving/Moderate complexity    Rehab Potential Good    PT Frequency 2x / week    PT Duration 8 weeks    PT Treatment/Interventions ADLs/Self Care Home Management;Aquatic Therapy;Cryotherapy;Electrical Stimulation;DME Instruction;Gait training;Stair training;Functional mobility training;Therapeutic activities;Therapeutic exercise;Balance training;Neuromuscular re-education;Patient/family education;Manual techniques;Passive range of motion;Dry needling;Spinal Manipulations;Joint Manipulations;Orthotic Fit/Training;Energy conservation    PT Next Visit Plan assess hamstring and quadricep flexibility, balance and BLE strength training, gait training, give HEP    PT Home Exercise Plan not administered this date    Consulted and Agree with Plan of Care Patient           Patient will benefit from skilled therapeutic intervention in order to improve the following deficits and impairments:  Abnormal gait, Decreased balance, Decreased mobility, Difficulty walking, Hypomobility, Decreased strength, Decreased knowledge of use of DME, Decreased coordination, Postural dysfunction, Impaired flexibility, Improper body mechanics, Impaired perceived functional ability, Decreased activity tolerance  Visit Diagnosis: Unsteadiness on feet  Abnormality of gait and mobility  Muscle weakness (generalized)     Problem List Patient Active Problem List   Diagnosis Date Noted  . Cognitive deficits   . Labile blood pressure   . Acute blood loss anemia   . Slow transit constipation   . Vascular headache   . 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 Hydrocephalus ([place]/18/2019  . Communicating hydrocephalus ([place]06/18/2019  . Pelvic fracture ([place]/03/2016  . CD (celiac disease) 08/04/2014  . Cancer of [place] lobe of right lung ([place]/19/2016  . Age related  osteoporosis 11/26/2013  . Absolute anemia 05/21/2013  . H/O malignant neoplasm 05/21/2013  . HLD (hyperlipidemia) 05/21/2013   MaJanna ArchPT, DPT   12/03/2019, 4:44 PM  CoPittstonAIN REE[facility] IncERVICES [address]dHainesNCAlaska[address]hone: [phone] Fax:  [phone]: MaJENICE LEINERRN: [id number] of Birth: 03/1945/04/15

## 2019-12-08 ENCOUNTER — Ambulatory Visit: Payer: PPO

## 2019-12-10 ENCOUNTER — Other Ambulatory Visit: Payer: Self-pay

## 2019-12-10 ENCOUNTER — Ambulatory Visit: Payer: PPO

## 2019-12-10 DIAGNOSIS — R2681 Unsteadiness on feet: Secondary | ICD-10-CM | POA: Diagnosis not present

## 2019-12-10 DIAGNOSIS — R269 Unspecified abnormalities of gait and mobility: Secondary | ICD-10-CM

## 2019-12-10 DIAGNOSIS — M6281 Muscle weakness (generalized): Secondary | ICD-10-CM

## 2019-12-10 NOTE — Therapy (Signed)
[place] MAIN [facility] SERVICES [address] [place], [place], [address] Phone: [phone]   Fax:  [phone]  Physical Therapy Treatment/RECERT  Patient Details  Name: Krista Evans MRN: [id number] Date of Birth: 1945/07/08 Referring Provider (PT): Ramonita Lab   Encounter Date: 12/10/2019   PT End of Session - 12/10/19 1117    Visit Number 14    Number of Visits 30    Date for PT Re-Evaluation 02/04/20    Authorization Type 4/10 PN 11/24/19    PT Start Time 1100    PT Stop Time 1144    PT Time Calculation (min) 44 min    Equipment Utilized During Treatment Gait belt    Activity Tolerance Patient tolerated treatment well    Behavior During Therapy WFL for tasks assessed/performed           Past Medical History:  Diagnosis Date   Abnormal Q waves on electrocardiogram    Anemia    Aortic atherosclerosis ([place])    "I could possibly have it, my grandmother had it"   Cancer ([place]) 05/2011   Right upper Lung CA with partial Lobectomy.   Celiac disease    Celiac disease    Dyspnea    with exertion   Hyperlipidemia    Hyperlipidemia    Lung mass    Meningioma (HCC)    Osteoporosis    Personal history of chemotherapy    Personal history of radiation therapy     Past Surgical History:  Procedure Laterality Date   LUNG LOBECTOMY     right lung   VENTRICULOPERITONEAL SHUNT Right 07/03/2017   Procedure: SHUNT INSERTION VENTRICULAR-PERITONEAL;  Surgeon: Newman Pies, MD;  Location: [place];  Service: Neurosurgery;  Laterality: Right;    There were no vitals filed for this visit.   Subjective Assessment - 12/10/19 1116    Subjective Patient reports no falls or LOB since last session. Has been compliant with HEP. Is eager to participate with PT session.    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 Patient has attended OP PT in the past year  for LLE strengthening and balance with positive results, but has not been in attendance recently due to COVID-19 pandemic. PMH includes osteoporosis, anemia, celiac disease, hyperlipidemia, hx of R upper lobe mass with resection 6/13, hx of pneumonia, meningioma, major atherosclerosis. VP shunt has been placed 06/2017.    Limitations Lifting;House hold activities;Standing;Walking    How long can you sit comfortably? n/a    How long can you stand comfortably? ~10 minutes (AM better than PM)    How long can you walk comfortably? a few mins until standing rest break with LLE off ground    Patient Stated Goals "to walk by myself"    Currently in Pain? No/denies              [facility] PT Assessment - 12/10/19 0001      Standardized Balance Assessment   Standardized Balance Assessment Berg Balance Test      Berg Balance Test   Sit to Stand Able to stand  independently using hands    Standing Unsupported Able to stand 2 minutes with supervision    Sitting with Back Unsupported but Feet Supported on Floor or Stool Able to sit safely and securely 2 minutes    Stand to Sit Controls descent by using hands    Transfers Able to transfer safely, definite need  of hands    Standing Unsupported with Eyes Closed Able to stand 10 seconds with supervision    Standing Unsupported with Feet Together Able to place feet together independently but unable to hold for 30 seconds    From Standing, Reach Forward with Outstretched Arm Can reach forward >5 cm safely (2")    From Standing Position, Pick up Object from [place] to pick up shoe, needs supervision    From Standing Position, Turn to Look Behind Over each Shoulder Looks behind one side only/other side shows less weight shift    Turn 360 Degrees Needs close supervision or verbal cueing    Standing Unsupported, Alternately Place Feet on Step/Stool Able to complete >2 steps/needs minimal assist    Standing Unsupported, One Foot in Front Able to take small step  independently and hold 30 seconds    Standing on One Leg Tries to lift leg/unable to hold 3 seconds but remains standing independently    Total Score 34             Goals: ABC: 44.25%  FOTO: 50.7 %  5x STS : 17.43 with heavy BUE support  BERG: 34/ 56 10 MWT: first attempt with freezing episode mid test 14.8 ; second attempt 11.48 seconds =0.88 m/s w rollator   Treatment: 2.5 ankle weights -march with BUE support 15x each LE  -abduction with BUE support 10x each LE -hip extension with BUE support 10x each LE Seated: 2.5 ankle weight: -LAQ 10x each LE  -march 10x each LE    Pt educated throughout session about proper posture and technique with exercises. Improved exercise technique, movement at target joints, use of target muscles after min to mod verbal, visual, tactile cues.                   PT Education - 12/10/19 1117    Education provided Yes    Education Details goals, POC    Person(s) Educated Patient    Methods Explanation;Demonstration;Tactile cues;Verbal cues    Comprehension Verbalized understanding;Returned demonstration;Verbal cues required;Tactile cues required            PT Short Term Goals - 12/10/19 1307      PT SHORT TERM GOAL #1   Title Patient will be independent in HEP demonstrating successful between session carryover and improve functional mobility and ease of completing ADLs.    Baseline 10/20/19: HEP not administered at evaluation 11/8: HEP compliant    Time 4    Period Weeks    Status Achieved    Target Date 11/17/19      PT SHORT TERM GOAL #2   Title Patient will increase ABC scale score >45% to demonstrate better functional mobility and better confidence with ADLs.    Baseline 10/20/19: 36% 11/8: 43% 11/24: 44%    Time 4    Period Weeks    Status Partially Met    Target Date 01/07/20             PT Long Term Goals - 12/10/19 1119      PT LONG TERM GOAL #1   Title Patient will increase FOTO score to equal to or  greater than 49% to demonstrate statistically significant improvement in mobility and quality of life.    Baseline 10/20/19: 32% 11/24: 50.7%    Time 8    Period Weeks    Status Achieved    Target Date 02/04/20      PT LONG TERM GOAL #2  Title Patient (> 52 years old) will complete five times sit to stand test in < 15 seconds without UE support indicating an increased LE strength and improved balance.    Baseline 10/20/19: 26.14 seconds with BUE support required 11/8: 22. 43 seconds with heavy BUE support 11/24: 17.43 seconds with heavy BUE support    Time 8    Period Weeks    Status Partially Met    Target Date 02/04/20      PT LONG TERM GOAL #3   Title Patient will demonstrate an improved Berg Balance Score of >42/56 with minimal need for BUE support as to demonstrate improved balance with ADLs such as sitting/standing and transfer balance and reduced fall risk.    Baseline 10/20/19: 28/56 with limitations due to fear of falling 11/8: 31/56 11/24: 34/56    Time 8    Period Weeks    Status Partially Met    Target Date 02/04/20      PT LONG TERM GOAL #4   Title Patient will increase 10 meter walk test to >1.57ms with improved foot clearance and increased hip flexion in swing phase as to improve gait speed for better community ambulation and to reduce fall risk.    Baseline 10/20/19: 0.657m with rollator, minimal hip flexion and foot clearance bilat 11/8: 0.81 m/s with rollator 11/24: 0.88 m/s with rollator    Time 8    Period Weeks    Status Partially Met    Target Date 02/04/20      PT LONG TERM GOAL #5   Title Patient will increase ABC scale score >60% to demonstrate better functional mobility and better confidence with ADLs.    Baseline 10/20/19: 36% 11/8: 43% 11/24: 44%    Time 8    Period Weeks    Status Partially Met    Target Date 02/04/20                 Plan - 12/10/19 1305    Clinical Impression Statement Patient is progressing towards functional goals at this  time. Her gait mechanics and velocity of ambulation is improving with use of rollator. She demonstrates less frequent episodes of freezing of gait pattern however it does still occasionally occur. She is able to transfer more quickly and safely however continues to be reliant upon UE's for support. Her balance continues to progress as her fear of falling is slowly starting to decrease allowing for better initiation of movement. Patient will benefit from skilled PT to address balance, strength and coordination deficits in order to maximize functional independence in the home.    Personal Factors and Comorbidities Time since onset of injury/illness/exacerbation;Age;Comorbidity 3+;Past/Current Experience;Transportation    Comorbidities osteoporosis, anemia, hx cancer, hyperlipidemia    Examination-Activity Limitations Bathing;Bed Mobility;Dressing;Reach Overhead;Stairs;Stand;Toileting;Locomotion Level;Squat;Transfers;Bend;Hygiene/Grooming;Carry;Sit    Examination-Participation Restrictions Community Activity;Driving;Shop;Yard Work;Cleaning;Meal Prep    Stability/Clinical Decision Making Evolving/Moderate complexity    Rehab Potential Good    PT Frequency 2x / week    PT Duration 8 weeks    PT Treatment/Interventions ADLs/Self Care Home Management;Aquatic Therapy;Cryotherapy;Electrical Stimulation;DME Instruction;Gait training;Stair training;Functional mobility training;Therapeutic activities;Therapeutic exercise;Balance training;Neuromuscular re-education;Patient/family education;Manual techniques;Passive range of motion;Dry needling;Spinal Manipulations;Joint Manipulations;Orthotic Fit/Training;Energy conservation    PT Next Visit Plan assess hamstring and quadricep flexibility, balance and BLE strength training, gait training, give HEP    PT Home Exercise Plan not administered this date    Consulted and Agree with Plan of Care Patient           Patient will benefit  from skilled therapeutic  intervention in order to improve the following deficits and impairments:  Abnormal gait, Decreased balance, Decreased mobility, Difficulty walking, Hypomobility, Decreased strength, Decreased knowledge of use of DME, Decreased coordination, Postural dysfunction, Impaired flexibility, Improper body mechanics, Impaired perceived functional ability, Decreased activity tolerance  Visit Diagnosis: Unsteadiness on feet  Abnormality of gait and mobility  Muscle weakness (generalized)     Problem List Patient Active Problem List   Diagnosis Date Noted   Cognitive deficits    Labile blood pressure    Acute blood loss anemia    Slow transit constipation    Vascular headache    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   Hydrocephalus ([place]) 07/03/2017   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

## 2019-12-13 ENCOUNTER — Ambulatory Visit
Admission: RE | Admit: 2019-12-13 | Discharge: 2019-12-13 | Disposition: A | Discharge status: Home / Self Care | Payer: PPO | Source: Ambulatory Visit | Attending: Neurosurgery | Admitting: Neurosurgery

## 2019-12-13 ENCOUNTER — Other Ambulatory Visit: Payer: Self-pay

## 2019-12-13 DIAGNOSIS — M4802 Spinal stenosis, cervical region: Secondary | ICD-10-CM | POA: Diagnosis not present

## 2019-12-13 DIAGNOSIS — R531 Weakness: Secondary | ICD-10-CM | POA: Diagnosis not present

## 2019-12-13 DIAGNOSIS — M47812 Spondylosis without myelopathy or radiculopathy, cervical region: Secondary | ICD-10-CM | POA: Diagnosis not present

## 2019-12-13 DIAGNOSIS — M5021 Other cervical disc displacement,  high cervical region: Secondary | ICD-10-CM | POA: Diagnosis not present

## 2019-12-15 ENCOUNTER — Other Ambulatory Visit: Payer: Self-pay

## 2019-12-15 ENCOUNTER — Ambulatory Visit: Payer: PPO

## 2019-12-15 DIAGNOSIS — R2681 Unsteadiness on feet: Secondary | ICD-10-CM | POA: Diagnosis not present

## 2019-12-15 DIAGNOSIS — R269 Unspecified abnormalities of gait and mobility: Secondary | ICD-10-CM

## 2019-12-15 DIAGNOSIS — M6281 Muscle weakness (generalized): Secondary | ICD-10-CM

## 2019-12-15 NOTE — Therapy (Signed)
[place] MAIN [facility] SERVICES [address] [place], [place], [address] Phone: [phone]   Fax:  [phone]  Physical Therapy Treatment  Patient Details  Name: Krista Evans MRN: [id number] Date of Birth: Mar 31, 1945 Referring Provider (PT): Ramonita Lab   Encounter Date: 12/15/2019   PT End of Session - 12/15/19 1021    Visit Number 15    Number of Visits 30    Date for PT Re-Evaluation 02/04/20    Authorization Type 5/10 PN 11/24/19    PT Start Time 1015    PT Stop Time 1059    PT Time Calculation (min) 44 min    Equipment Utilized During Treatment Gait belt    Activity Tolerance Patient tolerated treatment well    Behavior During Therapy WFL for tasks assessed/performed           Past Medical History:  Diagnosis Date  . Abnormal Q waves on electrocardiogram   . Anemia   . Aortic atherosclerosis ([place])    "I could possibly have it, my grandmother had it"  . Cancer [facility]) 05/2011   Right upper Lung CA with partial Lobectomy.  . Celiac disease   . Celiac disease   . Dyspnea    with exertion  . Hyperlipidemia   . Hyperlipidemia   . Lung mass   . Meningioma ([place])   . Osteoporosis   . Personal history of chemotherapy   . Personal history of radiation therapy     Past Surgical History:  Procedure Laterality Date  . LUNG LOBECTOMY     right lung  . VENTRICULOPERITONEAL SHUNT Right 07/03/2017   Procedure: SHUNT INSERTION VENTRICULAR-PERITONEAL;  Surgeon: Newman Pies, MD;  Location: [place];  Service: Neurosurgery;  Laterality: Right;    There were no vitals filed for this visit.   Subjective Assessment - 12/15/19 1020    Subjective Patient reports she had her MRI (for cervical) since last session. No falls or LOB since last session. Was not compliant with HEP due to the holidays.    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 Patient has attended OP  PT in the past year for LLE strengthening and balance with positive results, but has not been in attendance recently due to COVID-19 pandemic. PMH includes osteoporosis, anemia, celiac disease, hyperlipidemia, hx of R upper lobe mass with resection 6/13, hx of pneumonia, meningioma, major atherosclerosis. VP shunt has been placed 06/2017.    Limitations Lifting;House hold activities;Standing;Walking    How long can you sit comfortably? n/a    How long can you stand comfortably? ~10 minutes (AM better than PM)    How long can you walk comfortably? a few mins until standing rest break with LLE off ground    Patient Stated Goals "to walk by myself"    Currently in Pain? No/denies                 TherEx:    Nustep Lvl 3 RPM> 60 for cardiovascular and musculoskeletal challenge x 3 minutes    3 ankle weight: standing in // bars -standing marches with BUE support 12x each LE -standing hip extension with BUE support 10x each LE  -standing hip extension with BUE support  10x each LE.     ambulate with RW and close CGA, cues for reduction of freezing. >500 ft. Challenging with equal weight shift and rigidity of limbs. Corner negotiation is challenging with patient freezing  and occasionally requiring min A for rollator stabilization.   Neuro Re-ed 6" step taps, improved coordination of LLE, cues for sequencing of taps;; BUE support 20x total    Ambulate with SUE to no UE support 10x length of // bars; improved gait mechanics with decreased support   aiex pad: reach to right outside BOS grab a ball, stabilize into COM and then toss into hoop x 20 balls, SUE support   Step over theraband obstacle clap, then step back 10x each LE ; challenging to LLE.         Pt educated throughout session about proper posture and technique with exercises. Improved exercise technique, movement at target joints, use of target muscles after min to mod verbal, visual, tactile cues                         PT Education - 12/15/19 1021    Education provided Yes    Education Details exercise technique, body mechanics, stability    Person(s) Educated Patient    Methods Explanation;Demonstration;Tactile cues;Verbal cues    Comprehension Verbalized understanding;Returned demonstration;Verbal cues required;Tactile cues required            PT Short Term Goals - 12/10/19 1307      PT SHORT TERM GOAL #1   Title Patient will be independent in HEP demonstrating successful between session carryover and improve functional mobility and ease of completing ADLs.    Baseline 10/20/19: HEP not administered at evaluation 11/8: HEP compliant    Time 4    Period Weeks    Status Achieved    Target Date 11/17/19      PT SHORT TERM GOAL #2   Title Patient will increase ABC scale score >45% to demonstrate better functional mobility and better confidence with ADLs.    Baseline 10/20/19: 36% 11/8: 43% 11/24: 44%    Time 4    Period Weeks    Status Partially Met    Target Date 01/07/20             PT Long Term Goals - 12/10/19 1119      PT LONG TERM GOAL #1   Title Patient will increase FOTO score to equal to or greater than 49% to demonstrate statistically significant improvement in mobility and quality of life.    Baseline 10/20/19: 32% 11/24: 50.7%    Time 8    Period Weeks    Status Achieved    Target Date 02/04/20      PT LONG TERM GOAL #2   Title Patient (> 28 years old) will complete five times sit to stand test in < 15 seconds without UE support indicating an increased LE strength and improved balance.    Baseline 10/20/19: 26.14 seconds with BUE support required 11/8: 22. 43 seconds with heavy BUE support 11/24: 17.43 seconds with heavy BUE support    Time 8    Period Weeks    Status Partially Met    Target Date 02/04/20      PT LONG TERM GOAL #3   Title Patient will demonstrate an improved Berg Balance Score of >42/56 with minimal need for BUE  support as to demonstrate improved balance with ADLs such as sitting/standing and transfer balance and reduced fall risk.    Baseline 10/20/19: 28/56 with limitations due to fear of falling 11/8: 31/56 11/24: 34/56    Time 8    Period Weeks    Status Partially Met  Target Date 02/04/20      PT LONG TERM GOAL #4   Title Patient will increase 10 meter walk test to >1.27ms with improved foot clearance and increased hip flexion in swing phase as to improve gait speed for better community ambulation and to reduce fall risk.    Baseline 10/20/19: 0.687m with rollator, minimal hip flexion and foot clearance bilat 11/8: 0.81 m/s with rollator 11/24: 0.88 m/s with rollator    Time 8    Period Weeks    Status Partially Met    Target Date 02/04/20      PT LONG TERM GOAL #5   Title Patient will increase ABC scale score >60% to demonstrate better functional mobility and better confidence with ADLs.    Baseline 10/20/19: 36% 11/8: 43% 11/24: 44%    Time 8    Period Weeks    Status Partially Met    Target Date 02/04/20                 Plan - 12/15/19 1246    Clinical Impression Statement Progression of patient capacity for ambulation focused on today with patient requiring occasional rest breaks in standing. Episodes of freezing continue to occur with changing surfaces, obstacles, and turns but is improving with cueing and encouragement. Patient is able to perform short duration ambulation without UE support in // bars with heavy encouragement. Patient will benefit from skilled PT to address balance, strength and coordination deficits in order to maximize functional independence in the home.    Personal Factors and Comorbidities Time since onset of injury/illness/exacerbation;Age;Comorbidity 3+;Past/Current Experience;Transportation    Comorbidities osteoporosis, anemia, hx cancer, hyperlipidemia    Examination-Activity Limitations Bathing;Bed Mobility;Dressing;Reach  Overhead;Stairs;Stand;Toileting;Locomotion Level;Squat;Transfers;Bend;Hygiene/Grooming;Carry;Sit    Examination-Participation Restrictions Community Activity;Driving;Shop;Yard Work;Cleaning;Meal Prep    Stability/Clinical Decision Making Evolving/Moderate complexity    Rehab Potential Good    PT Frequency 2x / week    PT Duration 8 weeks    PT Treatment/Interventions ADLs/Self Care Home Management;Aquatic Therapy;Cryotherapy;Electrical Stimulation;DME Instruction;Gait training;Stair training;Functional mobility training;Therapeutic activities;Therapeutic exercise;Balance training;Neuromuscular re-education;Patient/family education;Manual techniques;Passive range of motion;Dry needling;Spinal Manipulations;Joint Manipulations;Orthotic Fit/Training;Energy conservation    PT Next Visit Plan assess hamstring and quadricep flexibility, balance and BLE strength training, gait training, give HEP    PT Home Exercise Plan not administered this date    Consulted and Agree with Plan of Care Patient           Patient will benefit from skilled therapeutic intervention in order to improve the following deficits and impairments:  Abnormal gait, Decreased balance, Decreased mobility, Difficulty walking, Hypomobility, Decreased strength, Decreased knowledge of use of DME, Decreased coordination, Postural dysfunction, Impaired flexibility, Improper body mechanics, Impaired perceived functional ability, Decreased activity tolerance  Visit Diagnosis: Unsteadiness on feet  Abnormality of gait and mobility  Muscle weakness (generalized)     Problem List Patient Active Problem List   Diagnosis Date Noted  . Cognitive deficits   . Labile blood pressure   . Acute blood loss anemia   . Slow transit constipation   . Vascular headache   . 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 Hydrocephalus ([facility]/18/2019  . Communicating hydrocephalus ([facility]06/18/2019  . Pelvic fracture ([facility]04/03/2016  . CD (celiac  disease) 08/04/2014  . Cancer of [place] lobe of right lung ([facility] [place]/19/2016  . Age related osteoporosis 11/26/2013  . Absolute anemia 05/21/2013  . H/O malignant neoplasm 05/21/2013  . HLD (hyperlipidemia) 05/21/2013   MaJanna ArchPT, DPT    12/15/2019, 12:48 PM  [place] MAIN [facility] SERVICES [address] [place], [place], [address] Phone: [phone]   Fax:  [phone]  Name: Krista Evans MRN: [id number] Date of Birth: [birth year]

## 2019-12-17 ENCOUNTER — Ambulatory Visit: Payer: PPO | Attending: Internal Medicine

## 2019-12-17 ENCOUNTER — Other Ambulatory Visit: Payer: Self-pay

## 2019-12-17 DIAGNOSIS — M6281 Muscle weakness (generalized): Secondary | ICD-10-CM | POA: Insufficient documentation

## 2019-12-17 DIAGNOSIS — R269 Unspecified abnormalities of gait and mobility: Secondary | ICD-10-CM | POA: Insufficient documentation

## 2019-12-17 DIAGNOSIS — R2681 Unsteadiness on feet: Secondary | ICD-10-CM | POA: Diagnosis not present

## 2019-12-17 NOTE — Therapy (Signed)
[place] MAIN [facility] SERVICES [address] [place], [place], [address] Phone: [phone]   Fax:  [phone]  Physical Therapy Treatment  Patient Details  Name: Krista Evans MRN: [id number] Date of Birth: February 07, 1945 Referring Provider (PT): Ramonita Lab   Encounter Date: 12/17/2019   PT End of Session - 12/17/19 1150    Visit Number 16    Number of Visits 30    Date for PT Re-Evaluation 02/04/20    Authorization Type 6/10 PN 11/24/19    PT Start Time 1101    PT Stop Time 1146    PT Time Calculation (min) 45 min    Equipment Utilized During Treatment Gait belt    Activity Tolerance Patient tolerated treatment well    Behavior During Therapy WFL for tasks assessed/performed           Past Medical History:  Diagnosis Date   Abnormal Q waves on electrocardiogram    Anemia    Aortic atherosclerosis ([place])    "I could possibly have it, my grandmother had it"   Cancer ([place]) 05/2011   Right upper Lung CA with partial Lobectomy.   Celiac disease    Celiac disease    Dyspnea    with exertion   Hyperlipidemia    Hyperlipidemia    Lung mass    Meningioma (HCC)    Osteoporosis    Personal history of chemotherapy    Personal history of radiation therapy     Past Surgical History:  Procedure Laterality Date   LUNG LOBECTOMY     right lung   VENTRICULOPERITONEAL SHUNT Right 07/03/2017   Procedure: SHUNT INSERTION VENTRICULAR-PERITONEAL;  Surgeon: Newman Pies, MD;  Location: [place];  Service: Neurosurgery;  Laterality: Right;    There were no vitals filed for this visit.   Subjective Assessment - 12/17/19 1149    Subjective Patient reports no falls or LOB since last session. Has been compliant with HEP. Feels more out of breath today.    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 Patient has attended OP PT in the past year for LLE  strengthening and balance with positive results, but has not been in attendance recently due to COVID-19 pandemic. PMH includes osteoporosis, anemia, celiac disease, hyperlipidemia, hx of R upper lobe mass with resection 6/13, hx of pneumonia, meningioma, major atherosclerosis. VP shunt has been placed 06/2017.    Limitations Lifting;House hold activities;Standing;Walking    How long can you sit comfortably? n/a    How long can you stand comfortably? ~10 minutes (AM better than PM)    How long can you walk comfortably? a few mins until standing rest break with LLE off ground    Patient Stated Goals "to walk by myself"    Currently in Pain? No/denies                neuro re-ed By support bar: SLS LLE 30 seconds x 2 trials.  Seated single limb taps 30 seconds to target on floor for improved spatial awareness and coordination of LLE.   Therex:  By support bar: -4" step (green step) lateral step up/down with BUE support, very challenging for patient with LLE. ~15x each direction  4" step with one hand on bar and one hand on PT hand, very challenging to stabilize on LLE. 15x each LE Side step with BUE support, cues for placement of LLE to avoid double step 2x sets  of 2 minutes Quantum leg press: BLE: first set #40 12x, second set #55 10x, tacitle cueing for LLE to reduce hyperextension and improve eccentric control  Quantum leg press unlilateral: #25; 10x, very challenging for LLE.   Patient's vitals monitored throughout the session. Spo2 dropped to 89% occasionally with exercise requiring rest breaks to return to >90%       Pt educated throughout session about proper posture and technique with exercises. Improved exercise technique, movement at target joints, use of target muscles after min to mod verbal, visual, tactile cues                  PT Education - 12/17/19 1149    Education provided Yes    Education Details exercise technique, body mechanics,    Person(s)  Educated Patient    Methods Explanation;Demonstration;Tactile cues;Verbal cues    Comprehension Verbalized understanding;Returned demonstration;Verbal cues required;Tactile cues required            PT Short Term Goals - 12/10/19 1307      PT SHORT TERM GOAL #1   Title Patient will be independent in HEP demonstrating successful between session carryover and improve functional mobility and ease of completing ADLs.    Baseline 10/20/19: HEP not administered at evaluation 11/8: HEP compliant    Time 4    Period Weeks    Status Achieved    Target Date 11/17/19      PT SHORT TERM GOAL #2   Title Patient will increase ABC scale score >45% to demonstrate better functional mobility and better confidence with ADLs.    Baseline 10/20/19: 36% 11/8: 43% 11/24: 44%    Time 4    Period Weeks    Status Partially Met    Target Date 01/07/20             PT Long Term Goals - 12/10/19 1119      PT LONG TERM GOAL #1   Title Patient will increase FOTO score to equal to or greater than 49% to demonstrate statistically significant improvement in mobility and quality of life.    Baseline 10/20/19: 32% 11/24: 50.7%    Time 8    Period Weeks    Status Achieved    Target Date 02/04/20      PT LONG TERM GOAL #2   Title Patient (> 3 years old) will complete five times sit to stand test in < 15 seconds without UE support indicating an increased LE strength and improved balance.    Baseline 10/20/19: 26.14 seconds with BUE support required 11/8: 22. 43 seconds with heavy BUE support 11/24: 17.43 seconds with heavy BUE support    Time 8    Period Weeks    Status Partially Met    Target Date 02/04/20      PT LONG TERM GOAL #3   Title Patient will demonstrate an improved Berg Balance Score of >42/56 with minimal need for BUE support as to demonstrate improved balance with ADLs such as sitting/standing and transfer balance and reduced fall risk.    Baseline 10/20/19: 28/56 with limitations due to fear of  falling 11/8: 31/56 11/24: 34/56    Time 8    Period Weeks    Status Partially Met    Target Date 02/04/20      PT LONG TERM GOAL #4   Title Patient will increase 10 meter walk test to >1.8ms with improved foot clearance and increased hip flexion in swing phase as to improve gait  speed for better community ambulation and to reduce fall risk.    Baseline 10/20/19: 0.41ms with rollator, minimal hip flexion and foot clearance bilat 11/8: 0.81 m/s with rollator 11/24: 0.88 m/s with rollator    Time 8    Period Weeks    Status Partially Met    Target Date 02/04/20      PT LONG TERM GOAL #5   Title Patient will increase ABC scale score >60% to demonstrate better functional mobility and better confidence with ADLs.    Baseline 10/20/19: 36% 11/8: 43% 11/24: 44%    Time 8    Period Weeks    Status Partially Met    Target Date 02/04/20                 Plan - 12/17/19 1150    Clinical Impression Statement Patient is progressing with functional mobility and strength with introduction to step negotiation as well as leg press tolerated well. Spatial awareness and prolonged muscle recruitment of LLE continues to be area of improvement. Patient is challenged with single limb stability on LLE with increased need for UE support. Patient will benefit from skilled PT to address balance, strength and coordination deficits in order to maximize functional independence in the home.    Personal Factors and Comorbidities Time since onset of injury/illness/exacerbation;Age;Comorbidity 3+;Past/Current Experience;Transportation    Comorbidities osteoporosis, anemia, hx cancer, hyperlipidemia    Examination-Activity Limitations Bathing;Bed Mobility;Dressing;Reach Overhead;Stairs;Stand;Toileting;Locomotion Level;Squat;Transfers;Bend;Hygiene/Grooming;Carry;Sit    Examination-Participation Restrictions Community Activity;Driving;Shop;Yard Work;Cleaning;Meal Prep    Stability/Clinical Decision Making  Evolving/Moderate complexity    Rehab Potential Good    PT Frequency 2x / week    PT Duration 8 weeks    PT Treatment/Interventions ADLs/Self Care Home Management;Aquatic Therapy;Cryotherapy;Electrical Stimulation;DME Instruction;Gait training;Stair training;Functional mobility training;Therapeutic activities;Therapeutic exercise;Balance training;Neuromuscular re-education;Patient/family education;Manual techniques;Passive range of motion;Dry needling;Spinal Manipulations;Joint Manipulations;Orthotic Fit/Training;Energy conservation    PT Next Visit Plan assess hamstring and quadricep flexibility, balance and BLE strength training, gait training, give HEP    PT Home Exercise Plan not administered this date    Consulted and Agree with Plan of Care Patient           Patient will benefit from skilled therapeutic intervention in order to improve the following deficits and impairments:  Abnormal gait, Decreased balance, Decreased mobility, Difficulty walking, Hypomobility, Decreased strength, Decreased knowledge of use of DME, Decreased coordination, Postural dysfunction, Impaired flexibility, Improper body mechanics, Impaired perceived functional ability, Decreased activity tolerance  Visit Diagnosis: Unsteadiness on feet  Abnormality of gait and mobility  Muscle weakness (generalized)     Problem List Patient Active Problem List   Diagnosis Date Noted   Cognitive deficits    Labile blood pressure    Acute blood loss anemia    Slow transit constipation    Vascular headache    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RT[address] [place] [address]Phone: [phone]  Fax:  [phone] Name: Krista DIMITROFFMRN: [id number] of Birth: 326-Apr-1947

## 2019-12-23 ENCOUNTER — Other Ambulatory Visit: Payer: Self-pay

## 2019-12-23 ENCOUNTER — Encounter: Payer: Self-pay | Admitting: Physical Therapy

## 2019-12-23 ENCOUNTER — Ambulatory Visit: Payer: PPO

## 2019-12-23 DIAGNOSIS — M6281 Muscle weakness (generalized): Secondary | ICD-10-CM

## 2019-12-23 DIAGNOSIS — R2681 Unsteadiness on feet: Secondary | ICD-10-CM | POA: Diagnosis not present

## 2019-12-23 DIAGNOSIS — R269 Unspecified abnormalities of gait and mobility: Secondary | ICD-10-CM

## 2019-12-23 NOTE — Therapy (Signed)
[place] MAIN [facility] SERVICES [address] [place], [place], [address] Phone: [phone]   Fax:  [phone]  Physical Therapy Treatment  Patient Details  Name: Krista Evans MRN: [id number] Date of Birth: 09-24-1945 Referring Provider (PT): Ramonita Lab   Encounter Date: 12/23/2019   PT End of Session - 12/23/19 0936    Visit Number 17    Number of Visits 30    Date for PT Re-Evaluation 02/04/20    Authorization Type 7/10 PN 11/24/19    PT Start Time 0930    PT Stop Time 1014    PT Time Calculation (min) 44 min    Equipment Utilized During Treatment Gait belt    Activity Tolerance Patient tolerated treatment well    Behavior During Therapy [facility] for tasks assessed/performed           Past Medical History:  Diagnosis Date  . Abnormal Q waves on electrocardiogram   . Anemia   . Aortic atherosclerosis ([place])    "I could possibly have it, my grandmother had it"  . Cancer [facility]) 05/2011   Right upper Lung CA with partial Lobectomy.  . Celiac disease   . Celiac disease   . Dyspnea    with exertion  . Hyperlipidemia   . Hyperlipidemia   . Lung mass   . Meningioma ([place])   . Osteoporosis   . Personal history of chemotherapy   . Personal history of radiation therapy     Past Surgical History:  Procedure Laterality Date  . LUNG LOBECTOMY     right lung  . VENTRICULOPERITONEAL SHUNT Right 07/03/2017   Procedure: SHUNT INSERTION VENTRICULAR-PERITONEAL;  Surgeon: Newman Pies, MD;  Location: [place];  Service: Neurosurgery;  Laterality: Right;    There were no vitals filed for this visit.   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 Patient has attended OP PT in the past year for LLE strengthening and balance with positive  results, but has not been in attendance recently due to COVID-19 pandemic. PMH includes osteoporosis, anemia, celiac disease, hyperlipidemia, hx of R upper lobe mass with resection 6/13, hx of pneumonia, meningioma, major atherosclerosis. VP shunt has been placed 06/2017.    Limitations Lifting;House hold activities;Standing;Walking    How long can you sit comfortably? n/a    How long can you stand comfortably? ~10 minutes (AM better than PM)    How long can you walk comfortably? a few mins until standing rest break with LLE off ground    Patient Stated Goals "to walk by myself"    Currently in Pain? No/denies            TREATMENT:  nustep x4 mins (unbilled)  Therex:   By support bar:   -4" step (green step) lateral step up/down with BUE support, very challenging for patient with LLE. ~15x each direction  (intermittently single UE support)  4" step taps with one hand on bar and one hand on PT hand, very challenging to stabilize on LLE. ~15x each LE (intermittently single UE support)  Side step with BUE support, cues for placement of LLE to avoid double step x2 minutes  Quantum leg press: BLE: first set #40 12x, second set #55 10x, tacitle cueing for LLE to reduce hyperextension and  improve eccentric control   Patient's vitals monitored throughout the session. Spo2 dropped to 93% occasionally.     Pt educated throughout session about proper posture and technique with exercises. Improved exercise technique, movement at target joints, use of target muscles after min to mod verbal, visual, tactile cues   Pt response/clinical impression: Session focused on promoting LE coordination, confidence of movement and single limb stability. Pt continued to need max encouragement and CGA-minA for steadying during session. The patient would benefit from further skilled PT intervention to maximize QOL, function, and safety.      PT Education - 12/23/19 0935    Education provided Yes     Education Details exercise technique, body mechanics    Person(s) Educated Patient    Methods Explanation;Demonstration;Tactile cues;Verbal cues    Comprehension Verbalized understanding;Returned demonstration;Verbal cues required;Tactile cues required            PT Short Term Goals - 12/10/19 1307      PT SHORT TERM GOAL #1   Title Patient will be independent in HEP demonstrating successful between session carryover and improve functional mobility and ease of completing ADLs.    Baseline 10/20/19: HEP not administered at evaluation 11/8: HEP compliant    Time 4    Period Weeks    Status Achieved    Target Date 11/17/19      PT SHORT TERM GOAL #2   Title Patient will increase ABC scale score >45% to demonstrate better functional mobility and better confidence with ADLs.    Baseline 10/20/19: 36% 11/8: 43% 11/24: 44%    Time 4    Period Weeks    Status Partially Met    Target Date 01/07/20             PT Long Term Goals - 12/10/19 1119      PT LONG TERM GOAL #1   Title Patient will increase FOTO score to equal to or greater than 49% to demonstrate statistically significant improvement in mobility and quality of life.    Baseline 10/20/19: 32% 11/24: 50.7%    Time 8    Period Weeks    Status Achieved    Target Date 02/04/20      PT LONG TERM GOAL #2   Title Patient (> 39 years old) will complete five times sit to stand test in < 15 seconds without UE support indicating an increased LE strength and improved balance.    Baseline 10/20/19: 26.14 seconds with BUE support required 11/8: 22. 43 seconds with heavy BUE support 11/24: 17.43 seconds with heavy BUE support    Time 8    Period Weeks    Status Partially Met    Target Date 02/04/20      PT LONG TERM GOAL #3   Title Patient will demonstrate an improved Berg Balance Score of >42/56 with minimal need for BUE support as to demonstrate improved balance with ADLs such as sitting/standing and transfer balance and reduced fall  risk.    Baseline 10/20/19: 28/56 with limitations due to fear of falling 11/8: 31/56 11/24: 34/56    Time 8    Period Weeks    Status Partially Met    Target Date 02/04/20      PT LONG TERM GOAL #4   Title Patient will increase 10 meter walk test to >1.94ms with improved foot clearance and increased hip flexion in swing phase as to improve gait speed for better community ambulation and to reduce fall risk.  Baseline 10/20/19: 0.24ms with rollator, minimal hip flexion and foot clearance bilat 11/8: 0.81 m/s with rollator 11/24: 0.88 m/s with rollator    Time 8    Period Weeks    Status Partially Met    Target Date 02/04/20      PT LONG TERM GOAL #5   Title Patient will increase ABC scale score >60% to demonstrate better functional mobility and better confidence with ADLs.    Baseline 10/20/19: 36% 11/8: 43% 11/24: 44%    Time 8    Period Weeks    Status Partially Met    Target Date 02/04/20                 Plan - 12/23/19 0936    Clinical Impression Statement Session focused on promoting LE coordination, confidence of movement and single limb stability. Pt continued to need max encouragement and CGA-minA for steadying during session. The patient would benefit from further skilled PT intervention to maximize QOL, function, and safety.    Personal Factors and Comorbidities Time since onset of injury/illness/exacerbation;Age;Comorbidity 3+;Past/Current Experience;Transportation    Comorbidities osteoporosis, anemia, hx cancer, hyperlipidemia    Examination-Activity Limitations Bathing;Bed Mobility;Dressing;Reach Overhead;Stairs;Stand;Toileting;Locomotion Level;Squat;Transfers;Bend;Hygiene/Grooming;Carry;Sit    Examination-Participation Restrictions Community Activity;Driving;Shop;Yard Work;Cleaning;Meal Prep    Stability/Clinical Decision Making Evolving/Moderate complexity    Rehab Potential Good    PT Frequency 2x / week    PT Duration 8 weeks    PT Treatment/Interventions  ADLs/Self Care Home Management;Aquatic Therapy;Cryotherapy;Electrical Stimulation;DME Instruction;Gait training;Stair training;Functional mobility training;Therapeutic activities;Therapeutic exercise;Balance training;Neuromuscular re-education;Patient/family education;Manual techniques;Passive range of motion;Dry needling;Spinal Manipulations;Joint Manipulations;Orthotic Fit/Training;Energy conservation    PT Next Visit Plan assess hamstring and quadricep flexibility, balance and BLE strength training, gait training, give HEP    PT Home Exercise Plan not administered this date    Consulted and Agree with Plan of Care Patient           Patient will benefit from skilled therapeutic intervention in order to improve the following deficits and impairments:  Abnormal gait, Decreased balance, Decreased mobility, Difficulty walking, Hypomobility, Decreased strength, Decreased knowledge of use of DME, Decreased coordination, Postural dysfunction, Impaired flexibility, Improper body mechanics, Impaired perceived functional ability, Decreased activity tolerance  Visit Diagnosis: Unsteadiness on feet  Abnormality of gait and mobility  Muscle weakness (generalized)  Neurologic gait disorder     Problem List Patient Active Problem List   Diagnosis Date Noted  . Cognitive deficits   . Labile blood pressure   . Acute blood loss anemia   . Slow transit constipation   . Vascular headache   . 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 Hydrocephalus ([place] 07/03/2017  . 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 Pelvic fracture ([place] 04/18/2016  . CD (celiac disease) 08/04/2014  . Cancer of [place] lobe of right lung ([place] 08/04/2014  . Age related osteoporosis 11/26/2013  . Absolute anemia 05/21/2013  . H/O malignant neoplasm 05/21/2013  . HLD (hyperlipidemia) 05/21/2013    DLieutenant DiegoPT, DPT 12:02 PM,12/23/19   CBeth[place] BeachMAIN RDuke Health Wittenberg HospitalSERVICES [address] [address]Phone: [phone]  Fax:  [phone] Name: Krista MCPHAILMRN: [id number] of Birth: 3June 11, 1947

## 2019-12-25 ENCOUNTER — Ambulatory Visit: Payer: PPO

## 2019-12-30 ENCOUNTER — Other Ambulatory Visit: Payer: Self-pay

## 2019-12-30 ENCOUNTER — Ambulatory Visit: Payer: PPO

## 2019-12-30 DIAGNOSIS — R269 Unspecified abnormalities of gait and mobility: Secondary | ICD-10-CM

## 2019-12-30 DIAGNOSIS — R2681 Unsteadiness on feet: Secondary | ICD-10-CM | POA: Diagnosis not present

## 2019-12-30 DIAGNOSIS — M6281 Muscle weakness (generalized): Secondary | ICD-10-CM

## 2019-12-30 NOTE — Therapy (Signed)
[place] MAIN [facility] SERVICES [address] [place], [place], [address] Phone: [phone]   Fax:  [phone]  Physical Therapy Treatment  Patient Details  Name: Krista Evans MRN: [id number] Date of Birth: 03-27-1945 Referring Provider (PT): Ramonita Lab   Encounter Date: 12/30/2019   PT End of Session - 12/30/19 0945    Visit Number 18    Number of Visits 30    Date for PT Re-Evaluation 02/04/20    PT Start Time 0932    PT Stop Time 1012    PT Time Calculation (min) 40 min    Equipment Utilized During Treatment Gait belt    Activity Tolerance Patient tolerated treatment well    Behavior During Therapy [facility] for tasks assessed/performed           Past Medical History:  Diagnosis Date  . Abnormal Q waves on electrocardiogram   . Anemia   . Aortic atherosclerosis ([place])    "I could possibly have it, my grandmother had it"  . Cancer [facility]) 05/2011   Right upper Lung CA with partial Lobectomy.  . Celiac disease   . Celiac disease   . Dyspnea    with exertion  . Hyperlipidemia   . Hyperlipidemia   . Lung mass   . Meningioma ([place])   . Osteoporosis   . Personal history of chemotherapy   . Personal history of radiation therapy     Past Surgical History:  Procedure Laterality Date  . LUNG LOBECTOMY     right lung  . VENTRICULOPERITONEAL SHUNT Right 07/03/2017   Procedure: SHUNT INSERTION VENTRICULAR-PERITONEAL;  Surgeon: Newman Pies, MD;  Location: [place];  Service: Neurosurgery;  Laterality: Right;    There were no vitals filed for this visit.   Subjective Assessment - 12/30/19 0944    Subjective Pt doing well today.    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 Patient has attended OP PT in the past year for LLE strengthening and balance with positive results, but has not been in attendance recently due to COVID-19 pandemic. PMH includes osteoporosis,  anemia, celiac disease, hyperlipidemia, hx of R upper lobe mass with resection 6/13, hx of pneumonia, meningioma, major atherosclerosis. VP shunt has been placed 06/2017.    Currently in Pain? No/denies          INTERVENTION: -Overground AMB warmup for ROM/ cardiovascular conditioning, gait training (intermittent freezing) -3" lateral stepups (1x2bilat, 1x3 bilat) BUE support (tachypnea p 1st set) -6" fwd stepups alternating 1x16 -STS from chair + airex (hands free) 1x10 (author facilitates trunk flexion to maximize pt confidence and technique); 1x8 c cues for forward lean prior to lift off, BUE flexion/reach -marching in place with LUE support 1x16 bilat -90 degree turns in standing, no hand support 8x each direction   PT Education - 12/30/19 0945    Education Details vitals technique and safety with stepping.    Person(s) Educated Patient    Methods Explanation;Demonstration    Comprehension Verbalized understanding            PT Short Term Goals - 12/10/19 1307      PT SHORT TERM GOAL #1   Title Patient will be independent in HEP demonstrating successful between session carryover and improve functional mobility and ease of completing ADLs.    Baseline 10/20/19: HEP not administered at evaluation 11/8: HEP compliant    Time 4    Period Weeks  Status Achieved    Target Date 11/17/19      PT SHORT TERM GOAL #2   Title Patient will increase ABC scale score >45% to demonstrate better functional mobility and better confidence with ADLs.    Baseline 10/20/19: 36% 11/8: 43% 11/24: 44%    Time 4    Period Weeks    Status Partially Met    Target Date 01/07/20             PT Long Term Goals - 12/10/19 1119      PT LONG TERM GOAL #1   Title Patient will increase FOTO score to equal to or greater than 49% to demonstrate statistically significant improvement in mobility and quality of life.    Baseline 10/20/19: 32% 11/24: 50.7%    Time 8    Period Weeks    Status Achieved     Target Date 02/04/20      PT LONG TERM GOAL #2   Title Patient (> 60 years old) will complete five times sit to stand test in < 15 seconds without UE support indicating an increased LE strength and improved balance.    Baseline 10/20/19: 26.14 seconds with BUE support required 11/8: 22. 43 seconds with heavy BUE support 11/24: 17.43 seconds with heavy BUE support    Time 8    Period Weeks    Status Partially Met    Target Date 02/04/20      PT LONG TERM GOAL #3   Title Patient will demonstrate an improved Berg Balance Score of >42/56 with minimal need for BUE support as to demonstrate improved balance with ADLs such as sitting/standing and transfer balance and reduced fall risk.    Baseline 10/20/19: 28/56 with limitations due to fear of falling 11/8: 31/56 11/24: 34/56    Time 8    Period Weeks    Status Partially Met    Target Date 02/04/20      PT LONG TERM GOAL #4   Title Patient will increase 10 meter walk test to >1.0m/s with improved foot clearance and increased hip flexion in swing phase as to improve gait speed for better community ambulation and to reduce fall risk.    Baseline 10/20/19: 0.64m/s with rollator, minimal hip flexion and foot clearance bilat 11/8: 0.81 m/s with rollator 11/24: 0.88 m/s with rollator    Time 8    Period Weeks    Status Partially Met    Target Date 02/04/20      PT LONG TERM GOAL #5   Title Patient will increase ABC scale score >60% to demonstrate better functional mobility and better confidence with ADLs.    Baseline 10/20/19: 36% 11/8: 43% 11/24: 44%    Time 8    Period Weeks    Status Partially Met    Target Date 02/04/20                 Plan - 12/30/19 0946    Clinical Impression Statement Continued with current plan of care as laid out in evaluation and recent prior sessions. Pt remains motivated to advance progress toward goals. Rest breaks provided as needed, pt quick to ask when needed. Pt does require varying levels of  assistance and cuing for completion of exercises for correct form and sometimes due to pain/weakness. Pt closely monitored throughout session for safe vitals response and to maximize patient safety during interventions. Pt continues to demonstrate progress toward goals AEB progression of some interventions this date either in volume   or intensity.    Personal Factors and Comorbidities Time since onset of injury/illness/exacerbation;Age;Comorbidity 3+;Past/Current Experience;Transportation    Comorbidities osteoporosis, anemia, hx cancer, hyperlipidemia    Examination-Activity Limitations Bathing;Bed Mobility;Dressing;Reach Overhead;Stairs;Stand;Toileting;Locomotion Level;Squat;Transfers;Bend;Hygiene/Grooming;Carry;Sit    Examination-Participation Restrictions Community Activity;Driving;Shop;Yard Work;Cleaning;Meal Prep    Stability/Clinical Decision Making Evolving/Moderate complexity    Clinical Decision Making Moderate    Rehab Potential Good    PT Frequency 2x / week    PT Duration 8 weeks    PT Treatment/Interventions ADLs/Self Care Home Management;Aquatic Therapy;Cryotherapy;Electrical Stimulation;DME Instruction;Gait training;Stair training;Functional mobility training;Therapeutic activities;Therapeutic exercise;Balance training;Neuromuscular re-education;Patient/family education;Manual techniques;Passive range of motion;Dry needling;Spinal Manipulations;Joint Manipulations;Orthotic Fit/Training;Energy conservation    PT Next Visit Plan assess hamstring and quadricep flexibility, balance and BLE strength training, gait training, give HEP    PT Home Exercise Plan not administered this date    Consulted and Agree with Plan of Care Patient           Patient will benefit from skilled therapeutic intervention in order to improve the following deficits and impairments:  Abnormal gait,Decreased balance,Decreased mobility,Difficulty walking,Hypomobility,Decreased strength,Decreased knowledge of use  of DME,Decreased coordination,Postural dysfunction,Impaired flexibility,Improper body mechanics,Impaired perceived functional ability,Decreased activity tolerance  Visit Diagnosis: Unsteadiness on feet  Abnormality of gait and mobility  Muscle weakness (generalized)  Neurologic gait disorder     Problem List Patient Active Problem List   Diagnosis Date Noted  . Cognitive deficits   . Labile blood pressure   . Acute blood loss anemia   . Slow transit constipation   . Vascular headache   . 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 Hydrocephalus ([facility]) 07/03/2017  . 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 Pelvic fracture ([facility]) 04/18/2016  . CD (celiac disease) 08/04/2014  . Cancer of upper lobe of right lung ([facility]) 08/04/2014  . Age related osteoporosis 11/26/2013  . Absolute anemia 05/21/2013  . H/O malignant neoplasm 05/21/2013  . HLD (hyperlipidemia) 05/21/2013   10:08 AM, 12/30/19 Allan C Buccola, PT, DPT Physical Therapist - [place] [facility]  Outpatient Physical Therapy- Main Campus [phone]    Buccola,Allan C 12/30/2019, 9:51 AM  [facility] MAIN REHAB SERVICES [address] [place], [place], [address] Phone: [phone]   Fax:  [phone]  Name: Krista Evans MRN: [id number] Date of Birth: 06/06/1945   

## 2020-01-01 ENCOUNTER — Ambulatory Visit: Payer: PPO

## 2020-01-06 ENCOUNTER — Ambulatory Visit: Payer: PPO

## 2020-01-06 DIAGNOSIS — D649 Anemia, unspecified: Secondary | ICD-10-CM | POA: Diagnosis not present

## 2020-01-06 DIAGNOSIS — E7849 Other hyperlipidemia: Secondary | ICD-10-CM | POA: Diagnosis not present

## 2020-01-08 ENCOUNTER — Ambulatory Visit: Payer: PPO

## 2020-01-08 ENCOUNTER — Other Ambulatory Visit: Payer: Self-pay

## 2020-01-08 DIAGNOSIS — M6281 Muscle weakness (generalized): Secondary | ICD-10-CM

## 2020-01-08 DIAGNOSIS — R269 Unspecified abnormalities of gait and mobility: Secondary | ICD-10-CM

## 2020-01-08 DIAGNOSIS — R2681 Unsteadiness on feet: Secondary | ICD-10-CM

## 2020-01-08 NOTE — Therapy (Signed)
[place] MAIN [facility] SERVICES [address] [place], [place], [address] Phone: [phone]   Fax:  [phone]  Physical Therapy Treatment  Patient Details  Name: Krista Evans MRN: [id number] Date of Birth: Feb 18, 1945 Referring Provider (PT): Ramonita Lab   Encounter Date: 01/08/2020   PT End of Session - 01/08/20 1025    Visit Number 19    Number of Visits 30    Date for PT Re-Evaluation 02/04/20    PT Start Time 0935    PT Stop Time 1020    PT Time Calculation (min) 45 min    Equipment Utilized During Treatment Gait belt    Activity Tolerance Patient tolerated treatment well;Patient limited by fatigue    Behavior During Therapy WFL for tasks assessed/performed           Past Medical History:  Diagnosis Date   Abnormal Q waves on electrocardiogram    Anemia    Aortic atherosclerosis ([place])    "I could possibly have it, my grandmother had it"   Cancer ([place]) 05/2011   Right upper Lung CA with partial Lobectomy.   Celiac disease    Celiac disease    Dyspnea    with exertion   Hyperlipidemia    Hyperlipidemia    Lung mass    Meningioma ([facility])    Osteoporosis    Personal history of chemotherapy    Personal history of radiation therapy     Past Surgical History:  Procedure Laterality Date   LUNG LOBECTOMY     right lung   VENTRICULOPERITONEAL SHUNT Right 07/03/2017   Procedure: SHUNT INSERTION VENTRICULAR-PERITONEAL;  Surgeon: Newman Pies, MD;  Location: [place];  Service: Neurosurgery;  Laterality: Right;    There were no vitals filed for this visit.   Subjective Assessment - 01/08/20 1024    Subjective The patient reports feeling tired today and that her left leg is not doing what she needs it to do.    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 Patient has attended OP PT in the past year for LLE strengthening and balance with  positive results, but has not been in attendance recently due to COVID-19 pandemic. PMH includes osteoporosis, anemia, celiac disease, hyperlipidemia, hx of R upper lobe mass with resection 6/13, hx of pneumonia, meningioma, major atherosclerosis. VP shunt has been placed 06/2017.    Currently in Pain? No/denies    Pain Score 0-No pain                INTERVENTION: -Overground AMB warmup for ROM/ cardiovascular conditioning, gait training (intermittent freezing) -4" lateral stepups x5 each BUE support  -6" fwd step ps alternating x10 -STS from chair + airex (hands free) 1x15 (author facilitates trunk flexion to maximize pt confidence and technique)  -marching in place with BUE support 1x16 bilat attempted LUE only support patient unable to do this visit -180 degree turns in standing, no hand support 5x each direction                         PT Short Term Goals - 12/10/19 1307      PT SHORT TERM GOAL #1   Title Patient will be independent in HEP demonstrating successful between session carryover and improve functional mobility and ease of completing ADLs.    Baseline 10/20/19: HEP not administered at evaluation 11/8: HEP compliant    Time 4  Period Weeks    Status Achieved    Target Date 11/17/19      PT SHORT TERM GOAL #2   Title Patient will increase ABC scale score >45% to demonstrate better functional mobility and better confidence with ADLs.    Baseline 10/20/19: 36% 11/8: 43% 11/24: 44%    Time 4    Period Weeks    Status Partially Met    Target Date 01/07/20             PT Long Term Goals - 12/10/19 1119      PT LONG TERM GOAL #1   Title Patient will increase FOTO score to equal to or greater than 49% to demonstrate statistically significant improvement in mobility and quality of life.    Baseline 10/20/19: 32% 11/24: 50.7%    Time 8    Period Weeks    Status Achieved    Target Date 02/04/20      PT LONG TERM GOAL #2   Title Patient (>  85 years old) will complete five times sit to stand test in < 15 seconds without UE support indicating an increased LE strength and improved balance.    Baseline 10/20/19: 26.14 seconds with BUE support required 11/8: 22. 43 seconds with heavy BUE support 11/24: 17.43 seconds with heavy BUE support    Time 8    Period Weeks    Status Partially Met    Target Date 02/04/20      PT LONG TERM GOAL #3   Title Patient will demonstrate an improved Berg Balance Score of >42/56 with minimal need for BUE support as to demonstrate improved balance with ADLs such as sitting/standing and transfer balance and reduced fall risk.    Baseline 10/20/19: 28/56 with limitations due to fear of falling 11/8: 31/56 11/24: 34/56    Time 8    Period Weeks    Status Partially Met    Target Date 02/04/20      PT LONG TERM GOAL #4   Title Patient will increase 10 meter walk test to >1.58ms with improved foot clearance and increased hip flexion in swing phase as to improve gait speed for better community ambulation and to reduce fall risk.    Baseline 10/20/19: 0.676m with rollator, minimal hip flexion and foot clearance bilat 11/8: 0.81 m/s with rollator 11/24: 0.88 m/s with rollator    Time 8    Period Weeks    Status Partially Met    Target Date 02/04/20      PT LONG TERM GOAL #5   Title Patient will increase ABC scale score >60% to demonstrate better functional mobility and better confidence with ADLs.    Baseline 10/20/19: 36% 11/8: 43% 11/24: 44%    Time 8    Period Weeks    Status Partially Met    Target Date 02/04/20                 Plan - 01/08/20 1026    Clinical Impression Statement Patient challenged with program today due to fatigue.  The patient able to perform 6 inch step ups forward to work on stair negotiation.  Moderate bilateral UE support used to advance foot up to the step with more difficulty recruiting LLE muscles.  The patient continues to benefit from additional skilled PT services  to improve coordinated movements and muscle function for improved quality of life.    Personal Factors and Comorbidities Time since onset of injury/illness/exacerbation;Age;Comorbidity 3+;Past/Current Experience;Transportation  Comorbidities osteoporosis, anemia, hx cancer, hyperlipidemia    Examination-Activity Limitations Bathing;Bed Mobility;Dressing;Reach Overhead;Stairs;Stand;Toileting;Locomotion Level;Squat;Transfers;Bend;Hygiene/Grooming;Carry;Sit    Examination-Participation Restrictions Community Activity;Driving;Shop;Yard Work;Cleaning;Meal Prep    Stability/Clinical Decision Making Evolving/Moderate complexity    Rehab Potential Good    PT Frequency 2x / week    PT Duration 8 weeks    PT Treatment/Interventions ADLs/Self Care Home Management;Aquatic Therapy;Cryotherapy;Electrical Stimulation;DME Instruction;Gait training;Stair training;Functional mobility training;Therapeutic activities;Therapeutic exercise;Balance training;Neuromuscular re-education;Patient/family education;Manual techniques;Passive range of motion;Dry needling;Spinal Manipulations;Joint Manipulations;Orthotic Fit/Training;Energy conservation    PT Next Visit Plan assess hamstring and quadricep flexibility, balance and BLE strength training, gait training, give HEP    PT Home Exercise Plan not administered this date    Consulted and Agree with Plan of Care Patient           Patient will benefit from skilled therapeutic intervention in order to improve the following deficits and impairments:  Abnormal gait,Decreased balance,Decreased mobility,Difficulty walking,Hypomobility,Decreased strength,Decreased knowledge of use of DME,Decreased coordination,Postural dysfunction,Impaired flexibility,Improper body mechanics,Impaired perceived functional ability,Decreased activity tolerance  Visit Diagnosis: Unsteadiness on feet  Abnormality of gait and mobility  Muscle weakness (generalized)  Neurologic gait  disorder     Problem List Patient Active Problem List   Diagnosis Date Noted   Cognitive deficits    Labile blood pressure    Acute blood loss anemia    Slow transit constipation    Vascular headache    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   Hydrocephalus ([place]) 07/03/2017   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

## 2020-01-13 ENCOUNTER — Ambulatory Visit: Payer: PPO

## 2020-01-13 DIAGNOSIS — Z85118 Personal history of other malignant neoplasm of bronchus and lung: Secondary | ICD-10-CM | POA: Diagnosis not present

## 2020-01-13 DIAGNOSIS — E441 Mild protein-calorie malnutrition: Secondary | ICD-10-CM | POA: Diagnosis not present

## 2020-01-13 DIAGNOSIS — I700 Atherosclerosis of aorta: Secondary | ICD-10-CM | POA: Diagnosis not present

## 2020-01-13 DIAGNOSIS — E7849 Other hyperlipidemia: Secondary | ICD-10-CM | POA: Diagnosis not present

## 2020-01-13 DIAGNOSIS — R260 Ataxic gait: Secondary | ICD-10-CM | POA: Diagnosis not present

## 2020-01-13 DIAGNOSIS — G912 (Idiopathic) normal pressure hydrocephalus: Secondary | ICD-10-CM | POA: Diagnosis not present

## 2020-01-13 DIAGNOSIS — M810 Age-related osteoporosis without current pathological fracture: Secondary | ICD-10-CM | POA: Diagnosis not present

## 2020-01-13 DIAGNOSIS — D329 Benign neoplasm of meninges, unspecified: Secondary | ICD-10-CM | POA: Diagnosis not present

## 2020-01-15 ENCOUNTER — Ambulatory Visit: Payer: PPO

## 2020-01-15 ENCOUNTER — Other Ambulatory Visit: Payer: Self-pay

## 2020-01-15 DIAGNOSIS — R2681 Unsteadiness on feet: Secondary | ICD-10-CM

## 2020-01-15 DIAGNOSIS — M6281 Muscle weakness (generalized): Secondary | ICD-10-CM

## 2020-01-15 DIAGNOSIS — R269 Unspecified abnormalities of gait and mobility: Secondary | ICD-10-CM

## 2020-01-15 NOTE — Therapy (Signed)
[place] MAIN [facility] SERVICES [address] [place], [place], [address] Phone: [phone]   Fax:  [phone]  Physical Therapy Treatment Physical Therapy Progress Note   Dates of reporting period  11/24/19   to   01/15/20  Patient Details  Name: Krista Evans MRN: [id number] Date of Birth: December 22, 1945 Referring Provider (PT): Ramonita Lab   Encounter Date: 01/15/2020   PT End of Session - 01/15/20 0922    Visit Number 20    Number of Visits 30    Date for PT Re-Evaluation 02/04/20    Authorization Type 10/10 PN 11/24/19; next session 1/10 PN 01/15/20    PT Start Time 0928    PT Stop Time 1014    PT Time Calculation (min) 46 min    Equipment Utilized During Treatment Gait belt    Activity Tolerance Patient tolerated treatment well;Patient limited by fatigue    Behavior During Therapy [facility] for tasks assessed/performed           Past Medical History:  Diagnosis Date  . Abnormal Q waves on electrocardiogram   . Anemia   . Aortic atherosclerosis ([place])    "I could possibly have it, my grandmother had it"  . Cancer [facility]) 05/2011   Right upper Lung CA with partial Lobectomy.  . Celiac disease   . Celiac disease   . Dyspnea    with exertion  . Hyperlipidemia   . Hyperlipidemia   . Lung mass   . Meningioma ([place])   . Osteoporosis   . Personal history of chemotherapy   . Personal history of radiation therapy     Past Surgical History:  Procedure Laterality Date  . LUNG LOBECTOMY     right lung  . 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   Subjective Assessment - 01/15/20 0933    Subjective Patient reports she did her exercises except on the holiday. Has not been walking, is fearful of walking too fast.    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 Patient has attended OP PT in the past year for LLE strengthening and balance with positive results, but has not been in attendance recently due to COVID-19 pandemic. PMH includes osteoporosis, anemia, celiac disease, hyperlipidemia, hx of R upper lobe mass with resection 6/13, hx of pneumonia, meningioma, major atherosclerosis. VP shunt has been placed 06/2017.    Currently in Pain? No/denies              [facility] PT Assessment - 01/15/20 0001      Standardized Balance Assessment   Standardized Balance Assessment Berg Balance Test      Berg Balance Test   Sit to Stand Able to stand  independently using hands    Standing Unsupported Able to stand 2 minutes with supervision    Sitting with Back Unsupported but Feet Supported on Floor or Stool Able to sit safely and securely 2 minutes    Stand to Sit Controls descent by using hands    Transfers Able to transfer safely, definite need of hands    Standing Unsupported with Eyes Closed Able to stand 10 seconds with supervision    Standing Unsupported with Feet Together Able to place feet together independently but unable to hold for 30  seconds    From Standing, Reach Forward with Outstretched Arm Can reach forward >5 cm safely (2")    From Standing Position, Pick up Object from [place] to pick up shoe, needs supervision    From Standing Position, Turn to Look Behind Over each Shoulder Looks behind one side only/other side shows less weight shift    Turn 360 Degrees Needs assistance while turning    Standing Unsupported, Alternately Place Feet on Step/Stool Able to complete >2 steps/needs minimal assist    Standing Unsupported, One Foot in Front Able to take small step independently and hold 30 seconds    Standing on One Leg Tries to lift leg/unable to hold 3 seconds but remains standing independently    Total Score 33              BP at start of  session: 172/68   Goals:   ABC : 46%  FOTO 5x STS: 18. 63 seconds with heavy BUE support  BERG: 33/ 56  10 MWT: 9.5 seconds: MET   Patient's condition has the potential to improve in response to therapy. Maximum improvement is yet to be obtained. The anticipated improvement is attainable and reasonable in a generally predictable time.  Patient reports she still feels unsteady and is fearful of losing her balance.      Access Code: [email or web address] URL: [email or web address] Date: 01/15/2020 Prepared by: Janna Arch  Exercises Standing March with Counter Support - 1 x daily - 7 x weekly - 2 sets - 10 reps - 5 hold Standing Hip Abduction with Counter Support - 1 x daily - 7 x weekly - 2 sets - 10 reps - 5 hold Standing Hip Extension with Counter Support - 1 x daily - 7 x weekly - 2 sets - 10 reps - 5 hold Standing Tandem Balance with Counter Support - 1 x daily - 7 x weekly - 2 sets - 2 reps - 30 hold Seated March - 1 x daily - 7 x weekly - 2 sets - 10 reps - 5 hold Seated Long Arc Quad - 1 x daily - 7 x weekly - 2 sets - 10 reps - 5 hold Seated Hip Abduction with Resistance - 1 x daily - 7 x weekly - 2 sets - 10 reps - 5 hold Seated Hip Adduction Isometrics with Ball - 1 x daily - 7 x weekly - 2 sets - 10 reps - 5 hold Seated Heel Toe Raises - 1 x daily - 7 x weekly - 2 sets - 10 reps - 5 hold                    PT Education - 01/15/20 6503    Education provided Yes    Education Details exercise technique, body mechanics    Person(s) Educated Patient    Methods Explanation;Tactile cues;Verbal cues;Demonstration    Comprehension Verbalized understanding;Returned demonstration;Tactile cues required;Verbal cues required            PT Short Term Goals - 01/15/20 0952      PT SHORT TERM GOAL #1   Title Patient will be independent in HEP demonstrating successful between session carryover and improve functional mobility and ease of completing ADLs.     Baseline 10/20/19: HEP not administered at evaluation 11/8: HEP compliant    Time 4    Period Weeks    Status Achieved    Target Date 11/17/19      PT SHORT TERM GOAL #2  Title Patient will increase ABC scale score >45% to demonstrate better functional mobility and better confidence with ADLs.    Baseline 10/20/19: 36% 11/8: 43% 11/24: 44% 12/30: 45%    Time 4    Period Weeks    Status Achieved    Target Date 01/07/20             PT Long Term Goals - 01/15/20 0944      PT LONG TERM GOAL #1   Title Patient will increase FOTO score to equal to or greater than 49% to demonstrate statistically significant improvement in mobility and quality of life.    Baseline 10/20/19: 32% 11/24: 50.7%    Time 8    Period Weeks    Status Achieved      PT LONG TERM GOAL #2   Title Patient (> 103 years old) will complete five times sit to stand test in < 15 seconds without UE support indicating an increased LE strength and improved balance.    Baseline 10/20/19: 26.14 seconds with BUE support required 11/8: 22. 43 seconds with heavy BUE support 11/24: 17.43 seconds with heavy BUE support 12/30: 18.63 with heavy BUE support    Time 8    Period Weeks    Status On-going    Target Date 02/04/20      PT LONG TERM GOAL #3   Title Patient will demonstrate an improved Berg Balance Score of >42/56 with minimal need for BUE support as to demonstrate improved balance with ADLs such as sitting/standing and transfer balance and reduced fall risk.    Baseline 10/20/19: 28/56 with limitations due to fear of falling 11/8: 31/56 11/24: 34/56 12/30: 22/ 56    Time 8    Period Weeks    Status Partially Met    Target Date 02/04/20      PT LONG TERM GOAL #4   Title Patient will increase 10 meter walk test to >1.92ms with improved foot clearance and increased hip flexion in swing phase as to improve gait speed for better community ambulation and to reduce fall risk.    Baseline 10/20/19: 0.620m with rollator, minimal  hip flexion and foot clearance bilat 11/8: 0.81 m/s with rollator 11/24: 0.88 m/s with rollator 12/30: 9.5 seconds >1.0 m/s    Time 8    Period Weeks    Status Achieved      PT LONG TERM GOAL #5   Title Patient will increase ABC scale score >60% to demonstrate better functional mobility and better confidence with ADLs.    Baseline 10/20/19: 36% 11/8: 43% 11/24: 44% 12/30: 46%    Time 8    Period Weeks    Status Partially Met    Target Date 02/04/20                 Plan - 01/15/20 0953    Clinical Impression Statement Patient demonstrated progression of 10 MWT meeting goal and improving score of ABC. Patient had slight decrease in goal progression of BERG and 5x STS due to recent absence from therapy due to the holidays. Demonstrates excellent motivation despite fear of LOB. Patient's condition has the potential to improve in response to therapy. Maximum improvement is yet to be obtained. The anticipated improvement is attainable and reasonable in a generally predictable time. Patient will benefit from skilled PT to address balance, strength and coordination deficits in order to maximize functional independence in the home.    Personal Factors and Comorbidities Time since onset of injury/illness/exacerbation;Age;Comorbidity  3+;Past/Current Experience;Transportation    Comorbidities osteoporosis, anemia, hx cancer, hyperlipidemia    Examination-Activity Limitations Bathing;Bed Mobility;Dressing;Reach Overhead;Stairs;Stand;Toileting;Locomotion Level;Squat;Transfers;Bend;Hygiene/Grooming;Carry;Sit    Examination-Participation Restrictions Community Activity;Driving;Shop;Yard Work;Cleaning;Meal Prep    Stability/Clinical Decision Making Evolving/Moderate complexity    Rehab Potential Good    PT Frequency 2x / week    PT Duration 8 weeks    PT Treatment/Interventions ADLs/Self Care Home Management;Aquatic Therapy;Cryotherapy;Electrical Stimulation;DME Instruction;Gait training;Stair  training;Functional mobility training;Therapeutic activities;Therapeutic exercise;Balance training;Neuromuscular re-education;Patient/family education;Manual techniques;Passive range of motion;Dry needling;Spinal Manipulations;Joint Manipulations;Orthotic Fit/Training;Energy conservation    PT Next Visit Plan assess hamstring and quadricep flexibility, balance and BLE strength training, gait training, give HEP    PT Home Exercise Plan not administered this date    Consulted and Agree with Plan of Care Patient           Patient will benefit from skilled therapeutic intervention in order to improve the following deficits and impairments:  Abnormal gait,Decreased balance,Decreased mobility,Difficulty walking,Hypomobility,Decreased strength,Decreased knowledge of use of DME,Decreased coordination,Postural dysfunction,Impaired flexibility,Improper body mechanics,Impaired perceived functional ability,Decreased activity tolerance  Visit Diagnosis: Unsteadiness on feet  Abnormality of gait and mobility  Muscle weakness (generalized)     Problem List Patient Active Problem List   Diagnosis Date Noted  . Cognitive deficits   . Labile blood pressure   . Acute blood loss anemia   . Slow transit constipation   . Vascular headache   . 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 Hydrocephalus ([place]) 07/03/2017  . 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 Pelvic fracture ([place]) 04/18/2016  . CD (celiac disease) 08/04/2014  . Cancer of upper lobe of right lung ([place]) 08/04/2014  . Age related osteoporosis 11/26/2013  . Absolute anemia 05/21/2013  . H/O malignant neoplasm 05/21/2013  . HLD (hyperlipidemia) 05/21/2013   Janna Arch, PT, DPT   01/15/2020, 12:48 PM  [place] MAIN [facility] SERVICES [address] [place], [place], [address] Phone: [phone]   Fax:  [phone]  Name: Krista Evans MRN: [id number] Date of Birth: [birth year]

## 2020-01-20 ENCOUNTER — Other Ambulatory Visit: Payer: Self-pay

## 2020-01-20 ENCOUNTER — Ambulatory Visit: Payer: PPO | Attending: Internal Medicine

## 2020-01-20 DIAGNOSIS — M6281 Muscle weakness (generalized): Secondary | ICD-10-CM | POA: Diagnosis not present

## 2020-01-20 DIAGNOSIS — R2681 Unsteadiness on feet: Secondary | ICD-10-CM | POA: Diagnosis not present

## 2020-01-20 DIAGNOSIS — R269 Unspecified abnormalities of gait and mobility: Secondary | ICD-10-CM | POA: Diagnosis not present

## 2020-01-20 NOTE — Therapy (Signed)
[place] MAIN [facility] SERVICES [address] [place], [place], [address] Phone: [phone]   Fax:  [phone]  Physical Therapy Treatment  Patient Details  Name: Krista Evans MRN: [id number] Date of Birth: [birth year] Referring Provider (PT): Ramonita Lab   Encounter Date: 01/20/2020   PT End of Session - 01/20/20 1102    Visit Number 21    Number of Visits 30    Date for PT Re-Evaluation 02/04/20    Authorization Type 10/10 PN 11/24/19; next session 1/10 PN 1/4    PT Start Time 1102    PT Stop Time 1144    PT Time Calculation (min) 42 min    Equipment Utilized During Treatment Gait belt    Activity Tolerance Patient tolerated treatment well;Patient limited by fatigue           Past Medical History:  Diagnosis Date  . Abnormal Q waves on electrocardiogram   . Anemia   . Aortic atherosclerosis ([place])    "I could possibly have it, my grandmother had it"  . Cancer [facility]) 05/2011   Right upper Lung CA with partial Lobectomy.  . Celiac disease   . Celiac disease   . Dyspnea    with exertion  . Hyperlipidemia   . Hyperlipidemia   . Lung mass   . Meningioma ([place])   . Osteoporosis   . Personal history of chemotherapy   . Personal history of radiation therapy     Past Surgical History:  Procedure Laterality Date  . LUNG LOBECTOMY     right lung  . VENTRICULOPERITONEAL SHUNT Right 07/03/2017   Procedure: SHUNT INSERTION VENTRICULAR-PERITONEAL;  Surgeon: Newman Pies, MD;  Location: [place];  Service: Neurosurgery;  Laterality: Right;    There were no vitals filed for this visit.   Subjective Assessment - 01/20/20 1101    Subjective Patient reported she is doing well today, no falls since last PT visit.    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 Patient has attended OP PT in the past year for LLE strengthening and balance with positive results, but has not  been in attendance recently due to COVID-19 pandemic. PMH includes osteoporosis, anemia, celiac disease, hyperlipidemia, hx of R upper lobe mass with resection 6/13, hx of pneumonia, meningioma, major atherosclerosis. VP shunt has been placed 06/2017.    Limitations Lifting;House hold activities;Standing;Walking    How long can you sit comfortably? n/a    How long can you stand comfortably? ~10 minutes (AM better than PM)    How long can you walk comfortably? a few mins until standing rest break with LLE off ground    Patient Stated Goals "to walk by myself"    Currently in Pain? No/denies           INTERVENTION: NMR: -Overground AMB warmup for ROM/ cardiovascular conditioning, gait training (intermittent freezing) x 76mnutes -4" lateral stepups x5 each BUE support  -6" fwd step ps alternating x10 bilateral UE support -STS from chair + airex (hands free) 1x15 (author facilitates trunk flexion to maximize pt confidence and technique)  -marching in place with BUE support 1x16 bilat attempted LUE only support patient unable to do this visit -180 degree turns in standing ( pt fatigued, deferred to next session)   92% oxygen after interventions with rest break improved to 97%  Pt response/clinical impression: Pt challenged by decreased UE support with all interventions, decreased confidence due to  fear of falling. Motivation throughout to maximize pt participation and exercise technique. The patient did report fatigue at the end of session. The patient would benefit from further skilled PT intervention to continue to progress towards goals.           PT Education - 01/20/20 1101    Education provided Yes    Education Details exercise technique/body Programmer, systems) Educated Patient    Methods Explanation;Demonstration;Tactile cues;Verbal cues    Comprehension Verbalized understanding;Returned demonstration;Verbal cues required;Tactile cues required            PT Short Term  Goals - 01/15/20 0952      PT SHORT TERM GOAL #1   Title Patient will be independent in HEP demonstrating successful between session carryover and improve functional mobility and ease of completing ADLs.    Baseline 10/20/19: HEP not administered at evaluation 11/8: HEP compliant    Time 4    Period Weeks    Status Achieved    Target Date 11/17/19      PT SHORT TERM GOAL #2   Title Patient will increase ABC scale score >45% to demonstrate better functional mobility and better confidence with ADLs.    Baseline 10/20/19: 36% 11/8: 43% 11/24: 44% 12/30: 45%    Time 4    Period Weeks    Status Achieved    Target Date 01/07/20             PT Long Term Goals - 01/15/20 0944      PT LONG TERM GOAL #1   Title Patient will increase FOTO score to equal to or greater than 49% to demonstrate statistically significant improvement in mobility and quality of life.    Baseline 10/20/19: 32% 11/24: 50.7%    Time 8    Period Weeks    Status Achieved      PT LONG TERM GOAL #2   Title Patient (75 years old) will complete five times sit to stand test in < 15 seconds without UE support indicating an increased LE strength and improved balance.    Baseline 10/20/19: 26.14 seconds with BUE support required 11/8: 22. 43 seconds with heavy BUE support 11/24: 17.43 seconds with heavy BUE support 12/30: 18.63 with heavy BUE support    Time 8    Period Weeks    Status On-going    Target Date 02/04/20      PT LONG TERM GOAL #3   Title Patient will demonstrate an improved Berg Balance Score of >42/56 with minimal need for BUE support as to demonstrate improved balance with ADLs such as sitting/standing and transfer balance and reduced fall risk.    Baseline 10/20/19: 28/56 with limitations due to fear of falling 11/8: 31/56 11/24: 34/56 12/30: 22/ 56    Time 8    Period Weeks    Status Partially Met    Target Date 02/04/20      PT LONG TERM GOAL #4   Title Patient will increase 10 meter walk test to  >1.36ms with improved foot clearance and increased hip flexion in swing phase as to improve gait speed for better community ambulation and to reduce fall risk.    Baseline 10/20/19: 0.664m with rollator, minimal hip flexion and foot clearance bilat 11/8: 0.81 m/s with rollator 11/24: 0.88 m/s with rollator 12/30: 9.5 seconds >1.0 m/s    Time 8    Period Weeks    Status Achieved      PT LONG TERM  GOAL #5   Title Patient will increase ABC scale score >60% to demonstrate better functional mobility and better confidence with ADLs.    Baseline 10/20/19: 36% 11/8: 43% 11/24: 44% 12/30: 46%    Time 8    Period Weeks    Status Partially Met    Target Date 02/04/20                 Plan - 01/20/20 1101    Clinical Impression Statement Pt challenged by decreased UE support with all interventions, decreased confidence due to fear of falling. Motivation throughout to maximize pt participation and exercise technique. The patient did report fatigue at the end of session. The patient would benefit from further skilled PT intervention to continue to progress towards goals.    Personal Factors and Comorbidities Time since onset of injury/illness/exacerbation;Age;Comorbidity 3+;Past/Current Experience;Transportation    Comorbidities osteoporosis, anemia, hx cancer, hyperlipidemia    Examination-Activity Limitations Bathing;Bed Mobility;Dressing;Reach Overhead;Stairs;Stand;Toileting;Locomotion Level;Squat;Transfers;Bend;Hygiene/Grooming;Carry;Sit    Examination-Participation Restrictions Community Activity;Driving;Shop;Yard Work;Cleaning;Meal Prep    Stability/Clinical Decision Making Evolving/Moderate complexity    Rehab Potential Good    PT Frequency 2x / week    PT Duration 8 weeks    PT Treatment/Interventions ADLs/Self Care Home Management;Aquatic Therapy;Cryotherapy;Electrical Stimulation;DME Instruction;Gait training;Stair training;Functional mobility training;Therapeutic activities;Therapeutic  exercise;Balance training;Neuromuscular re-education;Patient/family education;Manual techniques;Passive range of motion;Dry needling;Spinal Manipulations;Joint Manipulations;Orthotic Fit/Training;Energy conservation    PT Next Visit Plan assess hamstring and quadricep flexibility, balance and BLE strength training, gait training, give HEP    PT Home Exercise Plan not administered this date           Patient will benefit from skilled therapeutic intervention in order to improve the following deficits and impairments:  Abnormal gait,Decreased balance,Decreased mobility,Difficulty walking,Hypomobility,Decreased strength,Decreased knowledge of use of DME,Decreased coordination,Postural dysfunction,Impaired flexibility,Improper body mechanics,Impaired perceived functional ability,Decreased activity tolerance  Visit Diagnosis: Unsteadiness on feet  Abnormality of gait and mobility  Muscle weakness (generalized)  Neurologic gait disorder     Problem List Patient Active Problem List   Diagnosis Date Noted  . Cognitive deficits   . Labile blood pressure   . Acute blood loss anemia   . Slow transit constipation   . Vascular headache   . 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 Hydrocephalus ([place]) 07/03/2017  . 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 Pelvic fracture ([place]) 04/18/2016  . CD (celiac disease) 08/04/2014  . Cancer of upper lobe of right lung ([place]) 08/04/2014  . Age related osteoporosis 11/26/2013  . Absolute anemia 05/21/2013  . H/O malignant neoplasm 05/21/2013  . HLD (hyperlipidemia) 05/21/2013    Lieutenant Diego PT, DPT 11:48 AM,01/20/20   [place] MAIN [facility] SERVICES [address] [place], [place], [address] Phone: [phone]   Fax:  [phone]  Name: JENNIFFER VESSELS MRN: [id number] Date of Birth: Sep 21, 1945

## 2020-01-22 ENCOUNTER — Ambulatory Visit: Payer: PPO

## 2020-01-22 ENCOUNTER — Other Ambulatory Visit: Payer: Self-pay

## 2020-01-22 DIAGNOSIS — R2681 Unsteadiness on feet: Secondary | ICD-10-CM

## 2020-01-22 DIAGNOSIS — M6281 Muscle weakness (generalized): Secondary | ICD-10-CM

## 2020-01-22 DIAGNOSIS — R269 Unspecified abnormalities of gait and mobility: Secondary | ICD-10-CM

## 2020-01-22 NOTE — Therapy (Signed)
[place] MAIN [facility] SERVICES [address] [place], [place], [address] Phone: [phone]   Fax:  [phone]  Physical Therapy Treatment  Patient Details  Name: Krista Evans MRN: [id number] Date of Birth: 10-19-1945 Referring Provider (PT): Ramonita Lab   Encounter Date: 01/22/2020   PT End of Session - 01/22/20 1104    Visit Number 22    Number of Visits 30    Date for PT Re-Evaluation 02/04/20    Authorization Type 1/10 PN 1/4    PT Start Time 1100    PT Stop Time 1143    PT Time Calculation (min) 43 min    Equipment Utilized During Treatment Gait belt    Activity Tolerance Patient tolerated treatment well;Patient limited by fatigue           Past Medical History:  Diagnosis Date  . Abnormal Q waves on electrocardiogram   . Anemia   . Aortic atherosclerosis ([place])    "I could possibly have it, my grandmother had it"  . Cancer [facility]) 05/2011   Right upper Lung CA with partial Lobectomy.  . Celiac disease   . Celiac disease   . Dyspnea    with exertion  . Hyperlipidemia   . Hyperlipidemia   . Lung mass   . Meningioma ([place])   . Osteoporosis   . Personal history of chemotherapy   . Personal history of radiation therapy     Past Surgical History:  Procedure Laterality Date  . LUNG LOBECTOMY     right lung  . VENTRICULOPERITONEAL SHUNT Right 07/03/2017   Procedure: SHUNT INSERTION VENTRICULAR-PERITONEAL;  Surgeon: Newman Pies, MD;  Location: [place];  Service: Neurosurgery;  Laterality: Right;    There were no vitals filed for this visit.   Subjective Assessment - 01/22/20 1103    Subjective Patient reports no falls or LOB since last session. has been using both hands for stability while doing HEP.    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 Patient has attended OP PT in the past year for LLE strengthening and balance with positive results, but  has not been in attendance recently due to COVID-19 pandemic. PMH includes osteoporosis, anemia, celiac disease, hyperlipidemia, hx of R upper lobe mass with resection 6/13, hx of pneumonia, meningioma, major atherosclerosis. VP shunt has been placed 06/2017.    Limitations Lifting;House hold activities;Standing;Walking    How long can you sit comfortably? n/a    How long can you stand comfortably? ~10 minutes (AM better than PM)    How long can you walk comfortably? a few mins until standing rest break with LLE off ground    Patient Stated Goals "to walk by myself"    Currently in Pain? No/denies              neuro re-ed By support bar: SLS LLE 30 seconds x 2 trials.  Static stand holding ball for stabilization without UE support   static stand holding rainbow ball with horizontal and vertical head turns 10x each direction Seated soccer ball kicks for coordination/spatial awareness/muscle recruitment timing x2 minutes  Therex:  Nustep Lvl 2-3 4 minutes RPM> 60 for cardiovascular and musculoskeletal challenge.  Ambulate >600 ft with rollator and close CGA, patient requires cueing for step length, reduction of freezing when changing rooms or approaching obstacles. Occasional Min A of walker required for negotiation of obstacles in pathway.   By support bar: -4"  step (green step) lateral step up/down with BUE support, very challenging for patient with LLE. ~15x each direction  4" step with one hand on bar and one hand on PT hand, very challenging to stabilize on LLE. 15x each LE Side step with BUE support, cues for placement of LLE to avoid double step 2x sets of 2 minutes Mini squat with BUE support 10x   Seated hamstring stretch 2x30 second holds Seated adduction rainbow ball 10x 3 second holds   Patient's vitals monitored throughout the session. Spo2 dropped to 89% occasionally with exercise requiring rest breaks to return to >90%     Pt educated throughout session about proper  posture and technique with exercises. Improved exercise technique, movement at target joints, use of target muscles after min to mod verbal, visual, tactile cues                          PT Education - 01/22/20 1103    Education provided Yes    Education Details exercise technique, body mechanics    Person(s) Educated Patient    Methods Explanation;Demonstration;Tactile cues;Verbal cues    Comprehension Verbalized understanding;Returned demonstration;Verbal cues required;Tactile cues required            PT Short Term Goals - 01/15/20 0952      PT SHORT TERM GOAL #1   Title Patient will be independent in HEP demonstrating successful between session carryover and improve functional mobility and ease of completing ADLs.    Baseline 10/20/19: HEP not administered at evaluation 11/8: HEP compliant    Time 4    Period Weeks    Status Achieved    Target Date 11/17/19      PT SHORT TERM GOAL #2   Title Patient will increase ABC scale score >45% to demonstrate better functional mobility and better confidence with ADLs.    Baseline 10/20/19: 36% 11/8: 43% 11/24: 44% 12/30: 45%    Time 4    Period Weeks    Status Achieved    Target Date 01/07/20             PT Long Term Goals - 01/15/20 0944      PT LONG TERM GOAL #1   Title Patient will increase FOTO score to equal to or greater than 49% to demonstrate statistically significant improvement in mobility and quality of life.    Baseline 10/20/19: 32% 11/24: 50.7%    Time 8    Period Weeks    Status Achieved      PT LONG TERM GOAL #2   Title Patient (> 47 years old) will complete five times sit to stand test in < 15 seconds without UE support indicating an increased LE strength and improved balance.    Baseline 10/20/19: 26.14 seconds with BUE support required 11/8: 22. 43 seconds with heavy BUE support 11/24: 17.43 seconds with heavy BUE support 12/30: 18.63 with heavy BUE support    Time 8    Period Weeks     Status On-going    Target Date 02/04/20      PT LONG TERM GOAL #3   Title Patient will demonstrate an improved Berg Balance Score of >42/56 with minimal need for BUE support as to demonstrate improved balance with ADLs such as sitting/standing and transfer balance and reduced fall risk.    Baseline 10/20/19: 28/56 with limitations due to fear of falling 11/8: 31/56 11/24: 34/56 12/30: 22/ 56    Time 8  Period Weeks    Status Partially Met    Target Date 02/04/20      PT LONG TERM GOAL #4   Title Patient will increase 10 meter walk test to >1.22ms with improved foot clearance and increased hip flexion in swing phase as to improve gait speed for better community ambulation and to reduce fall risk.    Baseline 10/20/19: 0.669m with rollator, minimal hip flexion and foot clearance bilat 11/8: 0.81 m/s with rollator 11/24: 0.88 m/s with rollator 12/30: 9.5 seconds >1.0 m/s    Time 8    Period Weeks    Status Achieved      PT LONG TERM GOAL #5   Title Patient will increase ABC scale score >60% to demonstrate better functional mobility and better confidence with ADLs.    Baseline 10/20/19: 36% 11/8: 43% 11/24: 44% 12/30: 46%    Time 8    Period Weeks    Status Partially Met    Target Date 02/04/20                 Plan - 01/22/20 1135    Clinical Impression Statement Patient continues to be highly motivated throughout physical therapy session. She is highly fearful of losing balance requiring max cueing for stability interventions and object negotiation. She is improving with capacity for ambulation with no seated rest breaks required for longer walking performance. The patient continues to benefit from additional skilled PT services to improve coordinated movements and muscle function for improved quality of life    Personal Factors and Comorbidities Time since onset of injury/illness/exacerbation;Age;Comorbidity 3+;Past/Current Experience;Transportation    Comorbidities osteoporosis,  anemia, hx cancer, hyperlipidemia    Examination-Activity Limitations Bathing;Bed Mobility;Dressing;Reach Overhead;Stairs;Stand;Toileting;Locomotion Level;Squat;Transfers;Bend;Hygiene/Grooming;Carry;Sit    Examination-Participation Restrictions Community Activity;Driving;Shop;Yard Work;Cleaning;Meal Prep    Stability/Clinical Decision Making Evolving/Moderate complexity    Rehab Potential Good    PT Frequency 2x / week    PT Duration 8 weeks    PT Treatment/Interventions ADLs/Self Care Home Management;Aquatic Therapy;Cryotherapy;Electrical Stimulation;DME Instruction;Gait training;Stair training;Functional mobility training;Therapeutic activities;Therapeutic exercise;Balance training;Neuromuscular re-education;Patient/family education;Manual techniques;Passive range of motion;Dry needling;Spinal Manipulations;Joint Manipulations;Orthotic Fit/Training;Energy conservation    PT Next Visit Plan assess hamstring and quadricep flexibility, balance and BLE strength training, gait training, give HEP    PT Home Exercise Plan not administered this date           Patient will benefit from skilled therapeutic intervention in order to improve the following deficits and impairments:  Abnormal gait,Decreased balance,Decreased mobility,Difficulty walking,Hypomobility,Decreased strength,Decreased knowledge of use of DME,Decreased coordination,Postural dysfunction,Impaired flexibility,Improper body mechanics,Impaired perceived functional ability,Decreased activity tolerance  Visit Diagnosis: Unsteadiness on feet  Abnormality of gait and mobility  Muscle weakness (generalized)     Problem List Patient Active Problem List   Diagnosis Date Noted  . Cognitive deficits   . Labile blood pressure   . Acute blood loss anemia   . Slow transit constipation   . Vascular headache   . 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 Hydrocephalus (HC[facility]ashville06/18/2019  . Communicating hydrocephalus (HCIndian Mountain Lake06/18/2019  .  Pelvic fracture (HCClearview Acres04/03/2016  . CD (celiac disease) 08/04/2014  . Cancer of upper lobe of right lung (HCMoreland07/19/2016  . Age related osteoporosis 11/26/2013  . Absolute anemia 05/21/2013  . H/O malignant neoplasm 05/21/2013  . HLD (hyperlipidemia) 05/21/2013   MaJanna ArchPT, DPT   01/22/2020, 11:44 AM  [place] [facility] [address] [place]one: [phone] Fax:  [phone]  Name: Krista Evans MRN: [id number] Date of Birth: 06-25-1945

## 2020-01-26 ENCOUNTER — Ambulatory Visit: Payer: PPO

## 2020-01-26 ENCOUNTER — Other Ambulatory Visit: Payer: Self-pay

## 2020-01-26 DIAGNOSIS — M6281 Muscle weakness (generalized): Secondary | ICD-10-CM

## 2020-01-26 DIAGNOSIS — R269 Unspecified abnormalities of gait and mobility: Secondary | ICD-10-CM

## 2020-01-26 DIAGNOSIS — R2681 Unsteadiness on feet: Secondary | ICD-10-CM | POA: Diagnosis not present

## 2020-01-26 NOTE — Therapy (Signed)
[place] MAIN [facility] SERVICES [address] [place], [place], [address] Phone: [phone]   Fax:  [phone]  Physical Therapy Treatment  Patient Details  Name: Krista Evans MRN: [id number] Date of Birth: 1945-07-23 Referring Provider (PT): Ramonita Lab   Encounter Date: 01/26/2020   PT End of Session - 01/26/20 1019    Visit Number 23    Number of Visits 30    Date for PT Re-Evaluation 02/04/20    Authorization Type 3/10 PN 1/4    PT Start Time 1015    PT Stop Time 1059    PT Time Calculation (min) 44 min    Equipment Utilized During Treatment Gait belt    Activity Tolerance Patient tolerated treatment well;Patient limited by fatigue    Behavior During Therapy WFL for tasks assessed/performed           Past Medical History:  Diagnosis Date  . Abnormal Q waves on electrocardiogram   . Anemia   . Aortic atherosclerosis ([place])    "I could possibly have it, my grandmother had it"  . Cancer [facility]) 05/2011   Right upper Lung CA with partial Lobectomy.  . Celiac disease   . Celiac disease   . Dyspnea    with exertion  . Hyperlipidemia   . Hyperlipidemia   . Lung mass   . Meningioma ([place])   . Osteoporosis   . Personal history of chemotherapy   . Personal history of radiation therapy     Past Surgical History:  Procedure Laterality Date  . LUNG LOBECTOMY     right lung  . VENTRICULOPERITONEAL SHUNT Right 07/03/2017   Procedure: SHUNT INSERTION VENTRICULAR-PERITONEAL;  Surgeon: Newman Pies, MD;  Location: [place];  Service: Neurosurgery;  Laterality: Right;    There were no vitals filed for this visit.   Subjective Assessment - 01/26/20 1017    Subjective Patient reports no falls or LOB. Has been compliant with HEP but hasn't been able to walk outside.    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 Patient has attended OP PT in the past year for LLE  strengthening and balance with positive results, but has not been in attendance recently due to COVID-19 pandemic. PMH includes osteoporosis, anemia, celiac disease, hyperlipidemia, hx of R upper lobe mass with resection 6/13, hx of pneumonia, meningioma, major atherosclerosis. VP shunt has been placed 06/2017.    Limitations Lifting;House hold activities;Standing;Walking    How long can you sit comfortably? n/a    How long can you stand comfortably? ~10 minutes (AM better than PM)    How long can you walk comfortably? a few mins until standing rest break with LLE off ground    Patient Stated Goals "to walk by myself"    Currently in Pain? No/denies                   neuro re-ed By support bar: airex pad: 6" step modified tandem stance, 30 seconds cues for no UE support Seated soccer ball kicks for coordination/spatial awareness/muscle recruitment timing x2 minutes   Therex:  Nustep Lvl 2-3 4 minutes RPM> 60 for cardiovascular and musculoskeletal challenge.  Ambulate >600 ft with rollator and close CGA, patient requires cueing for step length, reduction of freezing when changing rooms or approaching obstacles. Occasional Min A of walker required for negotiation of obstacles in pathway.    By support bar: 6" step toe taps  decreasing UE support from SUE to hand flat 10x each LE, very challenging for LLE   Standing with # 3 ankle weight: CGA for stability  -Hip extension with bilateral upper extremity support, cueing for neutral hip alignment, upright posture for optimal muscle recruitment, and sequencing, 10x each LE,  -Hip abduction with bilateral upper extremity support, cueing for neutral foot alignment for correct muscle activation, 10x each LE -Hip flexion with bilateral upper extremity support, cueing for body mechanics, speed of muscle recruitment for optimal strengthening and stabilization 10x each LE -Hamstring curl with bilateral upper extremity support, cueing for knee  alignment for recruitment of hamstring musculature, 10x each LE   Seated with # 3 ankle weights  -Seated marches with upright posture, back away from back of chair for abdominal/trunk activation/stabilization, 10x each LE -Seated LAQ with 3 second holds, 10x each LE, cueing for muscle activation and sequencing for neutral alignment -Seated IR/ER with cueing for stabilizing knee placement with lateral foot movement for optimal muscle recruitment, 10x each LE     Seated hamstring stretch 2x30 second holds    Patient's vitals monitored throughout the session.  Pt educated throughout session about proper posture and technique with exercises. Improved exercise technique, movement at target joints, use of target muscles after min to mod verbal, visual, tactile cues                     PT Education - 01/26/20 1019    Education provided Yes    Education Details exercise technique, body mechanics    Person(s) Educated Patient    Methods Explanation;Demonstration;Tactile cues;Verbal cues    Comprehension Verbalized understanding;Returned demonstration;Verbal cues required;Tactile cues required            PT Short Term Goals - 01/15/20 0952      PT SHORT TERM GOAL #1   Title Patient will be independent in HEP demonstrating successful between session carryover and improve functional mobility and ease of completing ADLs.    Baseline 10/20/19: HEP not administered at evaluation 11/8: HEP compliant    Time 4    Period Weeks    Status Achieved    Target Date 11/17/19      PT SHORT TERM GOAL #2   Title Patient will increase ABC scale score >45% to demonstrate better functional mobility and better confidence with ADLs.    Baseline 10/20/19: 36% 11/8: 43% 11/24: 44% 12/30: 45%    Time 4    Period Weeks    Status Achieved    Target Date 01/07/20             PT Long Term Goals - 01/15/20 0944      PT LONG TERM GOAL #1   Title Patient will increase FOTO score to equal to  or greater than 49% to demonstrate statistically significant improvement in mobility and quality of life.    Baseline 10/20/19: 32% 11/24: 50.7%    Time 8    Period Weeks    Status Achieved      PT LONG TERM GOAL #2   Title Patient (> 79 years old) will complete five times sit to stand test in < 15 seconds without UE support indicating an increased LE strength and improved balance.    Baseline 10/20/19: 26.14 seconds with BUE support required 11/8: 22. 43 seconds with heavy BUE support 11/24: 17.43 seconds with heavy BUE support 12/30: 18.63 with heavy BUE support    Time 8    Period Weeks  Status On-going    Target Date 02/04/20      PT LONG TERM GOAL #3   Title Patient will demonstrate an improved Berg Balance Score of >42/56 with minimal need for BUE support as to demonstrate improved balance with ADLs such as sitting/standing and transfer balance and reduced fall risk.    Baseline 10/20/19: 28/56 with limitations due to fear of falling 11/8: 31/56 11/24: 34/56 12/30: 22/ 56    Time 8    Period Weeks    Status Partially Met    Target Date 02/04/20      PT LONG TERM GOAL #4   Title Patient will increase 10 meter walk test to >1.64ms with improved foot clearance and increased hip flexion in swing phase as to improve gait speed for better community ambulation and to reduce fall risk.    Baseline 10/20/19: 0.670m with rollator, minimal hip flexion and foot clearance bilat 11/8: 0.81 m/s with rollator 11/24: 0.88 m/s with rollator 12/30: 9.5 seconds >1.0 m/s    Time 8    Period Weeks    Status Achieved      PT LONG TERM GOAL #5   Title Patient will increase ABC scale score >60% to demonstrate better functional mobility and better confidence with ADLs.    Baseline 10/20/19: 36% 11/8: 43% 11/24: 44% 12/30: 46%    Time 8    Period Weeks    Status Partially Met    Target Date 02/04/20                 Plan - 01/26/20 1053    Clinical Impression Statement Patient is fearful of  LOB resulting in decreased willingness to perform tasks without UE support. She requires heavy cueing and encouragement for task performance however does demonstrate ability by end of session. The patient continues to benefit from additional skilled PT services to improve coordinated movements and muscle function for improved quality of life    Personal Factors and Comorbidities Time since onset of injury/illness/exacerbation;Age;Comorbidity 3+;Past/Current Experience;Transportation    Comorbidities osteoporosis, anemia, hx cancer, hyperlipidemia    Examination-Activity Limitations Bathing;Bed Mobility;Dressing;Reach Overhead;Stairs;Stand;Toileting;Locomotion Level;Squat;Transfers;Bend;Hygiene/Grooming;Carry;Sit    Examination-Participation Restrictions Community Activity;Driving;Shop;Yard Work;Cleaning;Meal Prep    Stability/Clinical Decision Making Evolving/Moderate complexity    Rehab Potential Good    PT Frequency 2x / week    PT Duration 8 weeks    PT Treatment/Interventions ADLs/Self Care Home Management;Aquatic Therapy;Cryotherapy;Electrical Stimulation;DME Instruction;Gait training;Stair training;Functional mobility training;Therapeutic activities;Therapeutic exercise;Balance training;Neuromuscular re-education;Patient/family education;Manual techniques;Passive range of motion;Dry needling;Spinal Manipulations;Joint Manipulations;Orthotic Fit/Training;Energy conservation    PT Next Visit Plan assess hamstring and quadricep flexibility, balance and BLE strength training, gait training, give HEP    PT Home Exercise Plan not administered this date           Patient will benefit from skilled therapeutic intervention in order to improve the following deficits and impairments:  Abnormal gait,Decreased balance,Decreased mobility,Difficulty walking,Hypomobility,Decreased strength,Decreased knowledge of use of DME,Decreased coordination,Postural dysfunction,Impaired flexibility,Improper body  mechanics,Impaired perceived functional ability,Decreased activity tolerance  Visit Diagnosis: Unsteadiness on feet  Abnormality of gait and mobility  Muscle weakness (generalized)     Problem List Patient Active Problem List   Diagnosis Date Noted  . Cognitive deficits   . Labile blood pressure   . Acute blood loss anemia   . Slow transit constipation   . Vascular headache   . 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 Hydrocephalus (HCSteuben06/18/2019  . Communicating hydrocephalus (HCOsceola06/18/2019  . Pelvic fracture (HCMarianne04/03/2016  . CD (  celiac disease) 08/04/2014  . Cancer of upper lobe of right lung ([place]) 08/04/2014  . Age related osteoporosis 11/26/2013  . Absolute anemia 05/21/2013  . H/O malignant neoplasm 05/21/2013  . HLD (hyperlipidemia) 05/21/2013   Janna Arch, PT, DPT   01/26/2020, 10:59 AM  [place] MAIN [facility] SERVICES [address] [place], [place], [address] Phone: [phone]   Fax:  [phone]  Name: Krista Evans MRN: [id number] Date of Birth: 19-Aug-1945

## 2020-01-27 DIAGNOSIS — R030 Elevated blood-pressure reading, without diagnosis of hypertension: Secondary | ICD-10-CM | POA: Diagnosis not present

## 2020-01-27 DIAGNOSIS — G910 Communicating hydrocephalus: Secondary | ICD-10-CM | POA: Diagnosis not present

## 2020-01-28 ENCOUNTER — Other Ambulatory Visit: Payer: Self-pay

## 2020-01-28 ENCOUNTER — Ambulatory Visit: Payer: PPO

## 2020-01-28 DIAGNOSIS — R2681 Unsteadiness on feet: Secondary | ICD-10-CM | POA: Diagnosis not present

## 2020-01-28 DIAGNOSIS — M6281 Muscle weakness (generalized): Secondary | ICD-10-CM

## 2020-01-28 DIAGNOSIS — R269 Unspecified abnormalities of gait and mobility: Secondary | ICD-10-CM

## 2020-01-28 NOTE — Therapy (Signed)
[place] MAIN [facility] SERVICES [address] [place], [place], [address] Phone: [phone]   Fax:  [phone]  Physical Therapy Treatment  Patient Details  Name: Krista Evans MRN: [id number] Date of Birth: [birth year] Referring Provider (PT): Ramonita Lab   Encounter Date: 01/28/2020   PT End of Session - 01/28/20 1022    Visit Number 24    Number of Visits 30    Date for PT Re-Evaluation 02/04/20    Authorization Type 4/10 PN 1/4    PT Start Time 1015    PT Stop Time 1059    PT Time Calculation (min) 44 min    Equipment Utilized During Treatment Gait belt    Activity Tolerance Patient tolerated treatment well;Patient limited by fatigue    Behavior During Therapy WFL for tasks assessed/performed           Past Medical History:  Diagnosis Date  . Abnormal Q waves on electrocardiogram   . Anemia   . Aortic atherosclerosis ([place])    "I could possibly have it, my grandmother had it"  . Cancer [facility]) 05/2011   Right upper Lung CA with partial Lobectomy.  . Celiac disease   . Celiac disease   . Dyspnea    with exertion  . Hyperlipidemia   . Hyperlipidemia   . Lung mass   . Meningioma ([place])   . Osteoporosis   . Personal history of chemotherapy   . Personal history of radiation therapy     Past Surgical History:  Procedure Laterality Date  . LUNG LOBECTOMY     right lung  . VENTRICULOPERITONEAL SHUNT Right 07/03/2017   Procedure: SHUNT INSERTION VENTRICULAR-PERITONEAL;  Surgeon: Newman Pies, MD;  Location: [place];  Service: Neurosurgery;  Laterality: Right;    There were no vitals filed for this visit.   Subjective Assessment - 01/28/20 1021    Subjective Patient saw Dr. Arnoldo Morale yesterday and was talked about pain management of her neck due to her recent MRI.    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 Patient has attended OP PT in the past year for  LLE strengthening and balance with positive results, but has not been in attendance recently due to COVID-19 pandemic. PMH includes osteoporosis, anemia, celiac disease, hyperlipidemia, hx of R upper lobe mass with resection 6/13, hx of pneumonia, meningioma, major atherosclerosis. VP shunt has been placed 06/2017.    Limitations Lifting;House hold activities;Standing;Walking    How long can you sit comfortably? n/a    How long can you stand comfortably? ~10 minutes (AM better than PM)    How long can you walk comfortably? a few mins until standing rest break with LLE off ground    Patient Stated Goals "to walk by myself"    Currently in Pain? No/denies                 neuro re-ed By support bar:  Static stand reach for PT hand in varying planes inside/outside BOS 2 minutes; first set w SUE support, second set with no UE support  SUE support on bar with opp UE holding ball against PT hand 6" step toe taps, very challenging 10x each LE BUE support tapping sticky notes labelled A and B for coordination, spatial awareness and sequencing x 4 minutes  airex pad: 6" step taps with cues for sequencing body mechanics and spatial awareness x 15 each LE  Therex:  Nustep  Lvl 3 4 minutes RPM> 60 for cardiovascular and musculoskeletal challenge.  Ambulate >600 ft with rollator and close CGA, patient requires cueing for step length, reduction of freezing when changing rooms or approaching obstacles. Cues for negotiation of walker required for negotiation of obstacles in pathway.    By support bar: Sit to stand 5x with UE support; cues for eccentric control. x2 trials  6" step toe taps decreasing UE support from SUE to hand flat 10x each LE, very challenging for LLE     Seated hamstring stretch 2x30 second holds     Patient's vitals monitored throughout the session.  Pt educated throughout session about proper posture and technique with exercises. Improved exercise technique, movement at  target joints, use of target muscles after min to mod verbal, visual, tactile cues     Patient continues to be challenged by stabilization with decreased UE support with palpable fear of LOB. She requires heavy encouragement throughout session for reduction of reliance/support from UE's. ability by end of session. The patient continues to benefit from additional skilled PT services to improve coordinated movements and muscle function for improved quality of life                   PT Education - 01/28/20 1022    Education provided Yes    Education Details exercise technique, body mechanics    Person(s) Educated Patient    Methods Explanation;Demonstration;Tactile cues;Verbal cues    Comprehension Verbalized understanding;Returned demonstration;Verbal cues required;Tactile cues required            PT Short Term Goals - 01/15/20 0952      PT SHORT TERM GOAL #1   Title Patient will be independent in HEP demonstrating successful between session carryover and improve functional mobility and ease of completing ADLs.    Baseline 10/20/19: HEP not administered at evaluation 11/8: HEP compliant    Time 4    Period Weeks    Status Achieved    Target Date 11/17/19      PT SHORT TERM GOAL #2   Title Patient will increase ABC scale score >45% to demonstrate better functional mobility and better confidence with ADLs.    Baseline 10/20/19: 36% 11/8: 43% 11/24: 44% 12/30: 45%    Time 4    Period Weeks    Status Achieved    Target Date 01/07/20             PT Long Term Goals - 01/15/20 0944      PT LONG TERM GOAL #1   Title Patient will increase FOTO score to equal to or greater than 49% to demonstrate statistically significant improvement in mobility and quality of life.    Baseline 10/20/19: 32% 11/24: 50.7%    Time 8    Period Weeks    Status Achieved      PT LONG TERM GOAL #2   Title Patient (> 65 years old) will complete five times sit to stand test in < 15 seconds  without UE support indicating an increased LE strength and improved balance.    Baseline 10/20/19: 26.14 seconds with BUE support required 11/8: 22. 43 seconds with heavy BUE support 11/24: 17.43 seconds with heavy BUE support 12/30: 18.63 with heavy BUE support    Time 8    Period Weeks    Status On-going    Target Date 02/04/20      PT LONG TERM GOAL #3   Title Patient will demonstrate an improved Oceanographer  Score of >42/56 with minimal need for BUE support as to demonstrate improved balance with ADLs such as sitting/standing and transfer balance and reduced fall risk.    Baseline 10/20/19: 28/56 with limitations due to fear of falling 11/8: 31/56 11/24: 34/56 12/30: 22/ 56    Time 8    Period Weeks    Status Partially Met    Target Date 02/04/20      PT LONG TERM GOAL #4   Title Patient will increase 10 meter walk test to >1.13ms with improved foot clearance and increased hip flexion in swing phase as to improve gait speed for better community ambulation and to reduce fall risk.    Baseline 10/20/19: 0.654m with rollator, minimal hip flexion and foot clearance bilat 11/8: 0.81 m/s with rollator 11/24: 0.88 m/s with rollator 12/30: 9.5 seconds >1.0 m/s    Time 8    Period Weeks    Status Achieved      PT LONG TERM GOAL #5   Title Patient will increase ABC scale score >60% to demonstrate better functional mobility and better confidence with ADLs.    Baseline 10/20/19: 36% 11/8: 43% 11/24: 44% 12/30: 46%    Time 8    Period Weeks    Status Partially Met    Target Date 02/04/20                 Plan - 01/28/20 1523    Clinical Impression Statement Patient continues to be challenged by stabilization with decreased UE support with palpable fear of LOB. She requires heavy encouragement throughout session for reduction of reliance/support from UE's. ability by end of session. The patient continues to benefit from additional skilled PT services to improve coordinated movements and  muscle function for improved quality of life    Personal Factors and Comorbidities Time since onset of injury/illness/exacerbation;Age;Comorbidity 3+;Past/Current Experience;Transportation    Comorbidities osteoporosis, anemia, hx cancer, hyperlipidemia    Examination-Activity Limitations Bathing;Bed Mobility;Dressing;Reach Overhead;Stairs;Stand;Toileting;Locomotion Level;Squat;Transfers;Bend;Hygiene/Grooming;Carry;Sit    Examination-Participation Restrictions Community Activity;Driving;Shop;Yard Work;Cleaning;Meal Prep    Stability/Clinical Decision Making Evolving/Moderate complexity    Rehab Potential Good    PT Frequency 2x / week    PT Duration 8 weeks    PT Treatment/Interventions ADLs/Self Care Home Management;Aquatic Therapy;Cryotherapy;Electrical Stimulation;DME Instruction;Gait training;Stair training;Functional mobility training;Therapeutic activities;Therapeutic exercise;Balance training;Neuromuscular re-education;Patient/family education;Manual techniques;Passive range of motion;Dry needling;Spinal Manipulations;Joint Manipulations;Orthotic Fit/Training;Energy conservation    PT Next Visit Plan assess hamstring and quadricep flexibility, balance and BLE strength training, gait training, give HEP    PT Home Exercise Plan not administered this date           Patient will benefit from skilled therapeutic intervention in order to improve the following deficits and impairments:  Abnormal gait,Decreased balance,Decreased mobility,Difficulty walking,Hypomobility,Decreased strength,Decreased knowledge of use of DME,Decreased coordination,Postural dysfunction,Impaired flexibility,Improper body mechanics,Impaired perceived functional ability,Decreased activity tolerance  Visit Diagnosis: Unsteadiness on feet  Abnormality of gait and mobility  Muscle weakness (generalized)     Problem List Patient Active Problem List   Diagnosis Date Noted  . Cognitive deficits   . Labile blood  pressure   . Acute blood loss anemia   . Slow transit constipation   . Vascular headache   . 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 [place] ([facility]/18[place]  . 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 Pelvic [place] ([facility]/03/2016  . CD (celiac disease) 08/04/2014  . Cancer of upper lobe of right lung ([facility]/19/2016  . Age related osteoporosis 11/26/2013  . Absolute anemia 05/21/2013  .  H/O malignant neoplasm 05/21/2013  . HLD (hyperlipidemia) 05/21/2013   Janna Arch, PT, DPT   01/28/2020, 3:24 PM  [place] MAIN [facility] SERVICES [address] [place], [place], [address] Phone: [phone]   Fax:  [phone]  Name: Krista Evans MRN: [id number] Date of Birth: 07/29/1945

## 2020-01-30 ENCOUNTER — Ambulatory Visit: Payer: PPO

## 2020-02-03 ENCOUNTER — Ambulatory Visit: Payer: PPO

## 2020-02-06 ENCOUNTER — Ambulatory Visit: Payer: PPO

## 2020-02-10 ENCOUNTER — Other Ambulatory Visit: Payer: Self-pay | Admitting: Internal Medicine

## 2020-02-10 ENCOUNTER — Ambulatory Visit: Payer: PPO | Attending: Internal Medicine

## 2020-02-10 ENCOUNTER — Other Ambulatory Visit: Payer: Self-pay

## 2020-02-10 ENCOUNTER — Ambulatory Visit: Payer: PPO

## 2020-02-10 DIAGNOSIS — R2681 Unsteadiness on feet: Secondary | ICD-10-CM | POA: Diagnosis not present

## 2020-02-10 DIAGNOSIS — Z23 Encounter for immunization: Secondary | ICD-10-CM

## 2020-02-10 DIAGNOSIS — R269 Unspecified abnormalities of gait and mobility: Secondary | ICD-10-CM

## 2020-02-10 DIAGNOSIS — M6281 Muscle weakness (generalized): Secondary | ICD-10-CM

## 2020-02-10 NOTE — Progress Notes (Signed)
   Covid-19 Vaccination Clinic  Name:  Krista Evans    MRN: [id number] DOB: 1945-11-02  02/10/2020  [name] was observed post Covid-19 immunization for 15 minutes without incident. She was provided with Vaccine Information Sheet and instruction to access the V-Safe system.   [name] was instructed to call 911 with any severe reactions post vaccine: Marland Kitchen Difficulty breathing  . Swelling of face and throat  . A fast heartbeat  . A bad rash all over body  . Dizziness and weakness   Immunizations Administered    Name Date Dose VIS Date Route   Pfizer COVID-19 Vaccine 02/10/2020 11:05 AM 0.3 mL 11/05/2019 Intramuscular   Manufacturer: [place]   Lot: [id number]   [place]: [id number]

## 2020-02-10 NOTE — Therapy (Signed)
[place] MAIN [facility] SERVICES [address] [place], [place], [address] Phone: [phone]   Fax:  [phone]  Physical Therapy Treatment/Recertification  Patient Details  Name: Krista Evans MRN: [id number] Date of Birth: 1945-06-11 Referring Provider (PT): Ramonita Lab   Encounter Date: 02/10/2020   PT End of Session - 02/11/20 0806    Visit Number 25    Number of Visits 30    Date for PT Re-Evaluation 02/04/20    Authorization Type 4/10 PN 1/4    PT Start Time 0845    PT Stop Time 0928    PT Time Calculation (min) 43 min    Equipment Utilized During Treatment Gait belt    Activity Tolerance Patient tolerated treatment well    Behavior During Therapy [facility] for tasks assessed/performed           Past Medical History:  Diagnosis Date  . Abnormal Q waves on electrocardiogram   . Anemia   . Aortic atherosclerosis ([place])    "I could possibly have it, my grandmother had it"  . Cancer [facility]) 05/2011   Right upper Lung CA with partial Lobectomy.  . Celiac disease   . Celiac disease   . Dyspnea    with exertion  . Hyperlipidemia   . Hyperlipidemia   . Lung mass   . Meningioma ([place])   . Osteoporosis   . Personal history of chemotherapy   . Personal history of radiation therapy     Past Surgical History:  Procedure Laterality Date  . LUNG LOBECTOMY     right lung  . VENTRICULOPERITONEAL SHUNT Right 07/03/2017   Procedure: SHUNT INSERTION VENTRICULAR-PERITONEAL;  Surgeon: Newman Pies, MD;  Location: [place];  Service: Neurosurgery;  Laterality: Right;    There were no vitals filed for this visit.   Subjective Assessment - 02/10/20 0842    Subjective Pt reports no pain.  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 Patient has attended OP PT in the past year for LLE strengthening and  balance with positive results, but has not been in attendance recently due to COVID-19 pandemic. PMH includes osteoporosis, anemia, celiac disease, hyperlipidemia, hx of R upper lobe mass with resection 6/13, hx of pneumonia, meningioma, major atherosclerosis. VP shunt has been placed 06/2017.    Limitations Lifting;House hold activities;Standing;Walking    How long can you sit comfortably? n/a    How long can you stand comfortably? ~10 minutes (AM better than PM)    How long can you walk comfortably? a few mins until standing rest break with LLE off ground    Patient Stated Goals "to walk by myself"    Currently in Pain? No/denies          Treatment:   The following testing was completed for today's recertification:  ABC scale: 43%  5xSTS 17.36 BUE support Brief seated rest break between sets  Sit<>stands 2x5   BERG: 35/56; Pt reports FOF with testing.  Education provided to pt regarding reassessment/findings of pt goals, functional progress and areas fpr improvement and   POC. Pt verbalized understanding.    Assessment: Patient is demonstrating progress toward majority of remaining goals with improved balance and LE power as evidenced by improved 5xSTS (17.36 seconds) and BERG score (35/56). Although pt demonstrates progress, pt did see slight decrease in ABC scale  score (43%). Patient's condition has the potential to improve in response to therapy. Maximum improvement is yet to be obtained. The anticipated improvement is attainable and reasonable in a generally predictable time. Pt will benefit from further skilled therapy to improve coordinated movements, LE strength, and balance in order to improve safety with all functional mobility and QOL.        PT Education - 02/11/20 0806    Education Details Pt educated on POC and findings from reassessing goals    Person(s) Educated Patient    Methods Explanation    Comprehension Verbalized understanding            PT Short  Term Goals - 02/11/20 0822      PT SHORT TERM GOAL #1   Title Patient will be independent in HEP demonstrating successful between session carryover and improve functional mobility and ease of completing ADLs.    Baseline 10/20/19: HEP not administered at evaluation 11/8: HEP compliant    Time 4    Period Weeks    Status Achieved    Target Date 11/17/19      PT SHORT TERM GOAL #2   Title Patient will increase ABC scale score >45% to demonstrate better functional mobility and better confidence with ADLs.    Baseline 10/20/19: 36% 11/8: 43% 11/24: 44% 12/30: 45%    Time 4    Period Weeks    Status Achieved    Target Date 01/07/20             PT Long Term Goals - 02/10/20 0849      PT LONG TERM GOAL #1   Title Patient will increase FOTO score to equal to or greater than 49% to demonstrate statistically significant improvement in mobility and quality of life.    Baseline 10/20/19: 32% 11/24: 50.7%    Time 8    Period Weeks    Status Achieved      PT LONG TERM GOAL #2   Title Patient (> 11 years old) will complete five times sit to stand test in < 15 seconds without UE support indicating an increased LE strength and improved balance.    Baseline 10/20/19: 26.14 seconds with BUE support required 11/8: 22. 43 seconds with heavy BUE support 11/24: 17.43 seconds with heavy BUE support 12/30: 18.63 with heavy BUE support; 02/10/2020 17.36 with B UE support    Time 8    Period Weeks    Status On-going    Target Date 04/06/20      PT LONG TERM GOAL #3   Title Patient will demonstrate an improved Berg Balance Score of >42/56 with minimal need for BUE support as to demonstrate improved balance with ADLs such as sitting/standing and transfer balance and reduced fall risk.    Baseline 10/20/19: 28/56 with limitations due to fear of falling 11/8: 31/56 11/24: 34/56 12/30: 22/ 56; 02/10/2020 35/56    Time 8    Period Weeks    Status Partially Met    Target Date 04/06/20      PT LONG TERM GOAL  #4   Title Patient will increase 10 meter walk test to >1.83ms with improved foot clearance and increased hip flexion in swing phase as to improve gait speed for better community ambulation and to reduce fall risk.    Baseline 10/20/19: 0.688m with rollator, minimal hip flexion and foot clearance bilat 11/8: 0.81 m/s with rollator 11/24: 0.88 m/s with rollator 12/30: 9.5 seconds >1.0 m/s    Time  8    Period Weeks    Status Achieved      PT LONG TERM GOAL #5   Title Patient will increase ABC scale score >60% to demonstrate better functional mobility and better confidence with ADLs.    Baseline 10/20/19: 36% 11/8: 43% 11/24: 44% 12/30: 46%; 02/10/2020 43.1%    Time 8    Period Weeks    Status Partially Met    Target Date 04/06/20                 Plan - 02/11/20 1062    Clinical Impression Statement Patient is demonstrating progress toward majority of remaining goals with improved balance and LE power as evidenced by improved 5xSTS (17.36 seconds) and BERG score (35/56). Although pt demonstrates progress, pt did see slight decrease in ABC scale score (43%). Patient's condition has the potential to improve in response to therapy. Maximum improvement is yet to be obtained. The anticipated improvement is attainable and reasonable in a generally predictable time. Pt will benefit from further skilled therapy to improve coordinated movements, LE strength, and balance in order to improve safety with all functional mobility and QOL.    Personal Factors and Comorbidities Time since onset of injury/illness/exacerbation;Age;Comorbidity 3+;Past/Current Experience;Transportation    Comorbidities osteoporosis, anemia, hx cancer, hyperlipidemia    Examination-Activity Limitations Bathing;Bed Mobility;Dressing;Reach Overhead;Stairs;Stand;Toileting;Locomotion Level;Squat;Transfers;Bend;Hygiene/Grooming;Carry;Sit    Examination-Participation Restrictions Community Activity;Driving;Shop;Yard Work;Cleaning;Meal  Prep    Stability/Clinical Decision Making Evolving/Moderate complexity    Rehab Potential Good    PT Frequency 2x / week    PT Duration 8 weeks    PT Treatment/Interventions ADLs/Self Care Home Management;Aquatic Therapy;Cryotherapy;Electrical Stimulation;DME Instruction;Gait training;Stair training;Functional mobility training;Therapeutic activities;Therapeutic exercise;Balance training;Neuromuscular re-education;Patient/family education;Manual techniques;Passive range of motion;Dry needling;Spinal Manipulations;Joint Manipulations;Orthotic Fit/Training;Energy conservation    PT Next Visit Plan assess hamstring and quadricep flexibility, balance and BLE strength training, gait training, give HEP, progress LE power exercises    PT Home Exercise Plan not administered this date           Patient will benefit from skilled therapeutic intervention in order to improve the following deficits and impairments:  Abnormal gait,Decreased balance,Decreased mobility,Difficulty walking,Hypomobility,Decreased strength,Decreased knowledge of use of DME,Decreased coordination,Postural dysfunction,Impaired flexibility,Improper body mechanics,Impaired perceived functional ability,Decreased activity tolerance  Visit Diagnosis: Unsteadiness on feet  Abnormality of gait and mobility  Muscle weakness (generalized)  Neurologic gait disorder     Problem List Patient Active Problem List   Diagnosis Date Noted  . Cognitive deficits   . Labile blood pressure   . Acute blood loss anemia   . Slow transit constipation   . Vascular headache   . 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 Hydrocephalus ([place]) 07/03/2017  . 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 Pelvic fracture ([place]) 04/18/2016  . CD (celiac disease) 08/04/2014  . Cancer of upper lobe of right lung ([place]) 08/04/2014  . Age related osteoporosis 11/26/2013  . Absolute anemia 05/21/2013  . H/O malignant neoplasm 05/21/2013  . HLD  (hyperlipidemia) 05/21/2013   Ricard Dillon PT, DPT  Zollie Pee 02/11/2020, 8:39 AM  [place] MAIN [facility] SERVICES [address] [place], [place], [address] Phone: [phone]   Fax:  [phone]  Name: GWENDOLIN BRIEL MRN: [id number] Date of Birth: 1945/09/07

## 2020-02-13 ENCOUNTER — Ambulatory Visit: Payer: PPO

## 2020-02-17 ENCOUNTER — Ambulatory Visit: Payer: PPO | Attending: Internal Medicine

## 2020-02-17 ENCOUNTER — Other Ambulatory Visit: Payer: Self-pay

## 2020-02-17 DIAGNOSIS — M6281 Muscle weakness (generalized): Secondary | ICD-10-CM | POA: Insufficient documentation

## 2020-02-17 DIAGNOSIS — R2681 Unsteadiness on feet: Secondary | ICD-10-CM | POA: Insufficient documentation

## 2020-02-17 DIAGNOSIS — R269 Unspecified abnormalities of gait and mobility: Secondary | ICD-10-CM | POA: Diagnosis not present

## 2020-02-17 NOTE — Therapy (Signed)
[place] MAIN [facility] SERVICES [address] [place], [place], [address] Phone: [phone]   Fax:  [phone]  Physical Therapy Treatment  Patient Details  Name: SKYELER SCALESE MRN: [id number] Date of Birth: 06-05-1945 Referring Provider (PT): Ramonita Lab   Encounter Date: 02/17/2020   PT End of Session - 02/17/20 1108    Visit Number 26    Number of Visits 41    Date for PT Re-Evaluation 04/06/20    Authorization Type 6/10 PN 1/4    PT Start Time 1100    PT Stop Time 1144    PT Time Calculation (min) 44 min    Equipment Utilized During Treatment Gait belt    Activity Tolerance Patient tolerated treatment well    Behavior During Therapy WFL for tasks assessed/performed           Past Medical History:  Diagnosis Date  . Abnormal Q waves on electrocardiogram   . Anemia   . Aortic atherosclerosis ([place])    "I could possibly have it, my grandmother had it"  . Cancer [facility]) 05/2011   Right upper Lung CA with partial Lobectomy.  . Celiac disease   . Celiac disease   . Dyspnea    with exertion  . Hyperlipidemia   . Hyperlipidemia   . Lung mass   . Meningioma ([place])   . Osteoporosis   . Personal history of chemotherapy   . Personal history of radiation therapy     Past Surgical History:  Procedure Laterality Date  . LUNG LOBECTOMY     right lung  . VENTRICULOPERITONEAL SHUNT Right 07/03/2017   Procedure: SHUNT INSERTION VENTRICULAR-PERITONEAL;  Surgeon: Newman Pies, MD;  Location: [place];  Service: Neurosurgery;  Laterality: Right;    There were no vitals filed for this visit.   Subjective Assessment - 02/17/20 1106    Subjective Patient reports compliance with HEP. Has been walking in her house but not outside. 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 Patient has attended OP PT in the past year for LLE  strengthening and balance with positive results, but has not been in attendance recently due to COVID-19 pandemic. PMH includes osteoporosis, anemia, celiac disease, hyperlipidemia, hx of R upper lobe mass with resection 6/13, hx of pneumonia, meningioma, major atherosclerosis. VP shunt has been placed 06/2017.    Limitations Lifting;House hold activities;Standing;Walking    How long can you sit comfortably? n/a    How long can you stand comfortably? ~10 minutes (AM better than PM)    How long can you walk comfortably? a few mins until standing rest break with LLE off ground    Patient Stated Goals "to walk by myself"    Currently in Pain? No/denies                     neuro re-ed By support bar:  cone taps with BUE support, attempt at SUE support unsuccessful, however able to perform with BUE support and PT cueing for sequencing : color and LE 15x each LE   SUE support on bar with opp UE holding ball against PT hand 6" step toe taps, very challenging 10x each LE BUE support tapping sticky notes labelled A and B for coordination, spatial awareness and sequencing x 4 minutes   airex pad: static stand spatial awareness no UE support 60 seconds x 2  trials    Therex:  Nustep Lvl 3 4 minutes RPM> 60 for cardiovascular and musculoskeletal challenge.  Ambulate >700 ft with rollator and close CGA, patient requires cueing for step length, reduction of freezing when changing rooms or approaching obstacles. Cues for negotiation of walker required for negotiation of obstacles in pathway.    By support bar: 2.5 ankle weight:  -6" step toe taps 15x each LE -hip abduction 15x each LE  Sit to stand 5x with UE support; cues for eccentric control. x2 trials   Seated 2.5 ankle weight: 6" step toe taps seated 15x each LE  March 15x each LE    Seated hamstring stretch 2x30 second holds     Patient's vitals monitored throughout the session.  Pt educated throughout session about proper posture  and technique with exercises. Improved exercise technique, movement at target joints, use of target muscles after min to mod verbal, visual, tactile cues     Patient challenged initially with decreasing UE support for stabilization interventions however upon positive encouragement was able to perform.  Ambulation required frequent standing breaks to catch breath however did not require seated breaks. The patient continues to benefit from additional skilled PT services to improve coordinated movements and muscle function for improved quality of life                   PT Education - 02/17/20 1107    Education provided Yes    Education Details exercise technique, body mechanics,    Person(s) Educated Patient    Methods Explanation;Demonstration;Tactile cues;Verbal cues    Comprehension Verbalized understanding;Returned demonstration;Verbal cues required;Tactile cues required            PT Short Term Goals - 02/11/20 5284      PT SHORT TERM GOAL #1   Title Patient will be independent in HEP demonstrating successful between session carryover and improve functional mobility and ease of completing ADLs.    Baseline 10/20/19: HEP not administered at evaluation 11/8: HEP compliant    Time 4    Period Weeks    Status Achieved    Target Date 11/17/19      PT SHORT TERM GOAL #2   Title Patient will increase ABC scale score >45% to demonstrate better functional mobility and better confidence with ADLs.    Baseline 10/20/19: 36% 11/8: 43% 11/24: 44% 12/30: 45%    Time 4    Period Weeks    Status Achieved    Target Date 01/07/20             PT Long Term Goals - 02/10/20 0849      PT LONG TERM GOAL #1   Title Patient will increase FOTO score to equal to or greater than 49% to demonstrate statistically significant improvement in mobility and quality of life.    Baseline 10/20/19: 32% 11/24: 50.7%    Time 8    Period Weeks    Status Achieved      PT LONG TERM GOAL #2    Title Patient (> 47 years old) will complete five times sit to stand test in < 15 seconds without UE support indicating an increased LE strength and improved balance.    Baseline 10/20/19: 26.14 seconds with BUE support required 11/8: 22. 43 seconds with heavy BUE support 11/24: 17.43 seconds with heavy BUE support 12/30: 18.63 with heavy BUE support; 02/10/2020 17.36 with B UE support    Time 8    Period Weeks  Status On-going    Target Date 04/06/20      PT LONG TERM GOAL #3   Title Patient will demonstrate an improved Berg Balance Score of >42/56 with minimal need for BUE support as to demonstrate improved balance with ADLs such as sitting/standing and transfer balance and reduced fall risk.    Baseline 10/20/19: 28/56 with limitations due to fear of falling 11/8: 31/56 11/24: 34/56 12/30: 22/ 56; 02/10/2020 35/56    Time 8    Period Weeks    Status Partially Met    Target Date 04/06/20      PT LONG TERM GOAL #4   Title Patient will increase 10 meter walk test to >1.78ms with improved foot clearance and increased hip flexion in swing phase as to improve gait speed for better community ambulation and to reduce fall risk.    Baseline 10/20/19: 0.670m with rollator, minimal hip flexion and foot clearance bilat 11/8: 0.81 m/s with rollator 11/24: 0.88 m/s with rollator 12/30: 9.5 seconds >1.0 m/s    Time 8    Period Weeks    Status Achieved      PT LONG TERM GOAL #5   Title Patient will increase ABC scale score >60% to demonstrate better functional mobility and better confidence with ADLs.    Baseline 10/20/19: 36% 11/8: 43% 11/24: 44% 12/30: 46%; 02/10/2020 43.1%    Time 8    Period Weeks    Status Partially Met    Target Date 04/06/20                 Plan - 02/17/20 1233    Clinical Impression Statement Patient challenged initially with decreasing UE support for stabilization interventions however upon positive encouragement was able to perform.  Ambulation required frequent  standing breaks to catch breath however did not require seated breaks. The patient continues to benefit from additional skilled PT services to improve coordinated movements and muscle function for improved quality of life    Personal Factors and Comorbidities Time since onset of injury/illness/exacerbation;Age;Comorbidity 3+;Past/Current Experience;Transportation    Comorbidities osteoporosis, anemia, hx cancer, hyperlipidemia    Examination-Activity Limitations Bathing;Bed Mobility;Dressing;Reach Overhead;Stairs;Stand;Toileting;Locomotion Level;Squat;Transfers;Bend;Hygiene/Grooming;Carry;Sit    Examination-Participation Restrictions Community Activity;Driving;Shop;Yard Work;Cleaning;Meal Prep    Stability/Clinical Decision Making Evolving/Moderate complexity    Rehab Potential Good    PT Frequency 2x / week    PT Duration 8 weeks    PT Treatment/Interventions ADLs/Self Care Home Management;Aquatic Therapy;Cryotherapy;Electrical Stimulation;DME Instruction;Gait training;Stair training;Functional mobility training;Therapeutic activities;Therapeutic exercise;Balance training;Neuromuscular re-education;Patient/family education;Manual techniques;Passive range of motion;Dry needling;Spinal Manipulations;Joint Manipulations;Orthotic Fit/Training;Energy conservation    PT Next Visit Plan assess hamstring and quadricep flexibility, balance and BLE strength training, gait training, give HEP, progress LE power exercises    PT Home Exercise Plan not administered this date           Patient will benefit from skilled therapeutic intervention in order to improve the following deficits and impairments:  Abnormal gait,Decreased balance,Decreased mobility,Difficulty walking,Hypomobility,Decreased strength,Decreased knowledge of use of DME,Decreased coordination,Postural dysfunction,Impaired flexibility,Improper body mechanics,Impaired perceived functional ability,Decreased activity tolerance  Visit  Diagnosis: Unsteadiness on feet  Abnormality of gait and mobility  Muscle weakness (generalized)     Problem List Patient Active Problem List   Diagnosis Date Noted  . Cognitive deficits   . Labile blood pressure   . Acute blood loss anemia   . Slow transit constipation   . Vascular headache   . 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 Hydrocephalus ([place]06/18/2019  . 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  Pelvic fracture ([place]) 04/18/2016  . CD (celiac disease) 08/04/2014  . Cancer of upper lobe of right lung ([place]) 08/04/2014  . Age related osteoporosis 11/26/2013  . Absolute anemia 05/21/2013  . H/O malignant neoplasm 05/21/2013  . HLD (hyperlipidemia) 05/21/2013   Janna Arch, PT, DPT   02/17/2020, 12:35 PM  [place] MAIN [facility] SERVICES [address] [place], [place], [address] Phone: [phone]   Fax:  [phone]  Name: BRITLEE SKOLNIK MRN: [id number] Date of Birth: [birth year]

## 2020-02-19 ENCOUNTER — Ambulatory Visit: Payer: PPO

## 2020-02-24 ENCOUNTER — Ambulatory Visit: Payer: PPO

## 2020-02-24 ENCOUNTER — Other Ambulatory Visit: Payer: Self-pay

## 2020-02-24 DIAGNOSIS — M6281 Muscle weakness (generalized): Secondary | ICD-10-CM

## 2020-02-24 DIAGNOSIS — R2681 Unsteadiness on feet: Secondary | ICD-10-CM | POA: Diagnosis not present

## 2020-02-24 DIAGNOSIS — R269 Unspecified abnormalities of gait and mobility: Secondary | ICD-10-CM

## 2020-02-24 NOTE — Therapy (Signed)
[place] MAIN [facility] SERVICES [address] [place], [place], [address] Phone: [phone]   Fax:  [phone]  Physical Therapy Treatment  Patient Details  Name: Krista Evans MRN: [id number] Date of Birth: [birth year] Referring Provider (PT): Ramonita Lab   Encounter Date: 02/24/2020   PT End of Session - 02/24/20 1038    Visit Number 27    Number of Visits 41    Date for PT Re-Evaluation 04/06/20    Authorization Type 6/10 PN 1/4    PT Start Time 0841    PT Stop Time 0930    PT Time Calculation (min) 49 min    Equipment Utilized During Treatment Gait belt    Activity Tolerance Patient tolerated treatment well    Behavior During Therapy WFL for tasks assessed/performed           Past Medical History:  Diagnosis Date  . Abnormal Q waves on electrocardiogram   . Anemia   . Aortic atherosclerosis ([place])    "I could possibly have it, my grandmother had it"  . Cancer [facility]) 05/2011   Right upper Lung CA with partial Lobectomy.  . Celiac disease   . Celiac disease   . Dyspnea    with exertion  . Hyperlipidemia   . Hyperlipidemia   . Lung mass   . Meningioma ([place])   . Osteoporosis   . Personal history of chemotherapy   . Personal history of radiation therapy     Past Surgical History:  Procedure Laterality Date  . LUNG LOBECTOMY     right lung  . VENTRICULOPERITONEAL SHUNT Right 07/03/2017   Procedure: SHUNT INSERTION VENTRICULAR-PERITONEAL;  Surgeon: Newman Pies, MD;  Location: [place];  Service: Neurosurgery;  Laterality: Right;    There were no vitals filed for this visit.   Subjective Assessment - 02/24/20 0838    Subjective The pt reports no pain today. Pt reports home exercises are going well and that she does them every day.    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 Patient has attended OP PT in the past year for LLE strengthening and  balance with positive results, but has not been in attendance recently due to COVID-19 pandemic. PMH includes osteoporosis, anemia, celiac disease, hyperlipidemia, hx of R upper lobe mass with resection 6/13, hx of pneumonia, meningioma, major atherosclerosis. VP shunt has been placed 06/2017.    Limitations Lifting;House hold activities;Standing;Walking    How long can you sit comfortably? n/a    How long can you stand comfortably? ~10 minutes (AM better than PM)    How long can you walk comfortably? a few mins until standing rest break with LLE off ground    Patient Stated Goals "to walk by myself"    Currently in Pain? No/denies          neuro re-ed: In // bars:  cone taps with BUE support to UUE support with intermittent  BUE support - x multiple reps each LE, pt required up to min assist at times when using UUE support due to increased anterior lean.   Therex:   Nustep Lvl 3 4 minutes RPM> 60 for cardiovascular and musculoskeletal challenge.   Ambulate 1x3, 1x1 (approx 500 ft) around clinic with rollator and close CGA, patient requires cueing for step length, heel strike B, and upright posture. Cues for negotiation of walker required for negotiation of obstacles in pathway. Pt with difficulty pivoting  and demonstrates decreased step-length/shuffling. Pt rates exercise as "medium." Pt with difficulty talking and ambulating at same time. Seated rest break between sets.   By support bar: 3# ankle weight:  -6" step toe taps 12x each LE; rates exercise as "medium" -hip abduction 1x12 and 1x6 each LE; VC/TC/demonstration for technique; rates "medium" first set and "hard" second set.   Sit to stand 1x8, 1x9  with UE support; cues for eccentric control, sitting all the way in the chair and hip extension with additional TC. Seated rest breaks between sets.  Seated hamstring stretch 2x30 second holds; TC for technique    PT Education - 02/24/20 1034    Education provided Yes    Education  Details standing hip abduction exercise technique and body mechanics    Person(s) Educated Patient    Methods Explanation;Demonstration;Verbal cues;Tactile cues    Comprehension Verbalized understanding;Returned demonstration            PT Short Term Goals - 02/11/20 4497      PT SHORT TERM GOAL #1   Title Patient will be independent in HEP demonstrating successful between session carryover and improve functional mobility and ease of completing ADLs.    Baseline 10/20/19: HEP not administered at evaluation 11/8: HEP compliant    Time 4    Period Weeks    Status Achieved    Target Date 11/17/19      PT SHORT TERM GOAL #2   Title Patient will increase ABC scale score >45% to demonstrate better functional mobility and better confidence with ADLs.    Baseline 10/20/19: 36% 11/8: 43% 11/24: 44% 12/30: 45%    Time 4    Period Weeks    Status Achieved    Target Date 01/07/20             PT Long Term Goals - 02/10/20 0849      PT LONG TERM GOAL #1   Title Patient will increase FOTO score to equal to or greater than 49% to demonstrate statistically significant improvement in mobility and quality of life.    Baseline 10/20/19: 32% 11/24: 50.7%    Time 8    Period Weeks    Status Achieved      PT LONG TERM GOAL #2   Title Patient (> 72 years old) will complete five times sit to stand test in < 15 seconds without UE support indicating an increased LE strength and improved balance.    Baseline 10/20/19: 26.14 seconds with BUE support required 11/8: 22. 43 seconds with heavy BUE support 11/24: 17.43 seconds with heavy BUE support 12/30: 18.63 with heavy BUE support; 02/10/2020 17.36 with B UE support    Time 8    Period Weeks    Status On-going    Target Date 04/06/20      PT LONG TERM GOAL #3   Title Patient will demonstrate an improved Berg Balance Score of >42/56 with minimal need for BUE support as to demonstrate improved balance with ADLs such as sitting/standing and transfer  balance and reduced fall risk.    Baseline 10/20/19: 28/56 with limitations due to fear of falling 11/8: 31/56 11/24: 34/56 12/30: 22/ 56; 02/10/2020 35/56    Time 8    Period Weeks    Status Partially Met    Target Date 04/06/20      PT LONG TERM GOAL #4   Title Patient will increase 10 meter walk test to >1.54ms with improved foot clearance and increased hip flexion in  swing phase as to improve gait speed for better community ambulation and to reduce fall risk.    Baseline 10/20/19: 0.40ms with rollator, minimal hip flexion and foot clearance bilat 11/8: 0.81 m/s with rollator 11/24: 0.88 m/s with rollator 12/30: 9.5 seconds >1.0 m/s    Time 8    Period Weeks    Status Achieved      PT LONG TERM GOAL #5   Title Patient will increase ABC scale score >60% to demonstrate better functional mobility and better confidence with ADLs.    Baseline 10/20/19: 36% 11/8: 43% 11/24: 44% 12/30: 46%; 02/10/2020 43.1%    Time 8    Period Weeks    Status Partially Met    Target Date 04/06/20             Plan - 02/24/20 1039    Clinical Impression Statement Pt able to progress to 3# AW with hip abduction and 6" step toe-taps. Pt also increased number of reps performed with STS this session, indicating improved B LE strength and power. Although pt demonstrates progress, pt required up to min assist from PT to maintain balance when performing cone taps with unilateral UE support. Pt will benefit from further skilled PT to improve strength, mobility and balance to increase safety with all functional activities and improve QOL.    Personal Factors and Comorbidities Time since onset of injury/illness/exacerbation;Age;Comorbidity 3+;Past/Current Experience;Transportation    Comorbidities osteoporosis, anemia, hx cancer, hyperlipidemia    Examination-Activity Limitations Bathing;Bed Mobility;Dressing;Reach Overhead;Stairs;Stand;Toileting;Locomotion Level;Squat;Transfers;Bend;Hygiene/Grooming;Carry;Sit     Examination-Participation Restrictions Community Activity;Driving;Shop;Yard Work;Cleaning;Meal Prep    Stability/Clinical Decision Making Evolving/Moderate complexity    Rehab Potential Good    PT Frequency 2x / week    PT Duration 8 weeks    PT Treatment/Interventions ADLs/Self Care Home Management;Aquatic Therapy;Cryotherapy;Electrical Stimulation;DME Instruction;Gait training;Stair training;Functional mobility training;Therapeutic activities;Therapeutic exercise;Balance training;Neuromuscular re-education;Patient/family education;Manual techniques;Passive range of motion;Dry needling;Spinal Manipulations;Joint Manipulations;Orthotic Fit/Training;Energy conservation    PT Next Visit Plan assess hamstring and quadricep flexibility, balance and BLE strength training, gait training, give HEP, progress LE power exercises    PT Home Exercise Plan not administered this date           Patient will benefit from skilled therapeutic intervention in order to improve the following deficits and impairments:  Abnormal gait,Decreased balance,Decreased mobility,Difficulty walking,Hypomobility,Decreased strength,Decreased knowledge of use of DME,Decreased coordination,Postural dysfunction,Impaired flexibility,Improper body mechanics,Impaired perceived functional ability,Decreased activity tolerance  Visit Diagnosis: Unsteadiness on feet  Abnormality of gait and mobility  Muscle weakness (generalized)     Problem List Patient Active Problem List   Diagnosis Date Noted  . Cognitive deficits   . Labile blood pressure   . Acute blood loss anemia   . Slow transit constipation   . Vascular headache   . 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 Hydrocephalus ([place] 07/03/2017  . Communicating hydrocephalus ([place] 07/03/2017  . Pelvic fracture ([place] 04/18/2016  . CD (celiac disease) 08/04/2014  . Cancer of upper lobe of right lung ([place] 08/04/2014  . Age related osteoporosis 11/26/2013  . Absolute anemia  05/21/2013  . H/O malignant neoplasm 05/21/2013  . HLD (hyperlipidemia) 05/21/2013   HRicard DillonPT, DPT 02/24/2020, 10:44 AM  [place] [facility] [address] [address]Phone: [phone]  Fax:  [phone] Name: Krista BARDWELLMRN: [id number] of Birth: 3Dec 18, 1947

## 2020-02-25 ENCOUNTER — Ambulatory Visit: Payer: PPO

## 2020-02-26 ENCOUNTER — Ambulatory Visit: Payer: PPO

## 2020-02-26 ENCOUNTER — Other Ambulatory Visit: Payer: Self-pay

## 2020-02-26 DIAGNOSIS — R2681 Unsteadiness on feet: Secondary | ICD-10-CM | POA: Diagnosis not present

## 2020-02-26 DIAGNOSIS — M6281 Muscle weakness (generalized): Secondary | ICD-10-CM

## 2020-02-26 DIAGNOSIS — R269 Unspecified abnormalities of gait and mobility: Secondary | ICD-10-CM

## 2020-02-26 NOTE — Therapy (Signed)
[place] MAIN [facility] SERVICES [address] [place], [place], [address] Phone: [phone]   Fax:  [phone]  Physical Therapy Treatment  Patient Details  Name: Krista Evans MRN: [id number] Date of Birth: 08-12-1945 Referring Provider (PT): Ramonita Lab   Encounter Date: 02/26/2020   PT End of Session - 02/26/20 1401    Visit Number 28    Number of Visits 41    Date for PT Re-Evaluation 04/06/20    Authorization Type 8/10 PN 1/4    PT Start Time 1100    PT Stop Time 1146    PT Time Calculation (min) 46 min    Equipment Utilized During Treatment Gait belt    Activity Tolerance Patient tolerated treatment well    Behavior During Therapy WFL for tasks assessed/performed           Past Medical History:  Diagnosis Date  . Abnormal Q waves on electrocardiogram   . Anemia   . Aortic atherosclerosis ([place])    "I could possibly have it, my grandmother had it"  . Cancer [facility]) 05/2011   Right upper Lung CA with partial Lobectomy.  . Celiac disease   . Celiac disease   . Dyspnea    with exertion  . Hyperlipidemia   . Hyperlipidemia   . Lung mass   . Meningioma ([place])   . Osteoporosis   . Personal history of chemotherapy   . Personal history of radiation therapy     Past Surgical History:  Procedure Laterality Date  . LUNG LOBECTOMY     right lung  . VENTRICULOPERITONEAL SHUNT Right 07/03/2017   Procedure: SHUNT INSERTION VENTRICULAR-PERITONEAL;  Surgeon: Newman Pies, MD;  Location: [place];  Service: Neurosurgery;  Laterality: Right;    There were no vitals filed for this visit.   Subjective Assessment - 02/26/20 1400    Subjective Patient reports it is warm outside and she would like to walk outside if possible to get more independent.    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 Patient has attended OP PT in the past year for LLE strengthening and  balance with positive results, but has not been in attendance recently due to COVID-19 pandemic. PMH includes osteoporosis, anemia, celiac disease, hyperlipidemia, hx of R upper lobe mass with resection 6/13, hx of pneumonia, meningioma, major atherosclerosis. VP shunt has been placed 06/2017.    Limitations Lifting;House hold activities;Standing;Walking    How long can you sit comfortably? n/a    How long can you stand comfortably? ~10 minutes (AM better than PM)    How long can you walk comfortably? a few mins until standing rest break with LLE off ground    Patient Stated Goals "to walk by myself"    Currently in Pain? No/denies           ambulate across stable and unstable surface outside. Negotiating changing surfaces from grass to sidewalk, across brick with turns and obstacles in pathway without LOB with rollator and close CGA, patient requires cueing for step length, heel strike B, and upright posture. Cues for negotiation of walker required for negotiation of obstacles in pathway. Pt with difficulty pivoting and demonstrates decreased step-length/shuffling. Pt rates exercise as "hard." Pt with difficulty talking and ambulating at same time.Two seated rest breaks required. 26 minutes  Seated: RTB hamstring curls 15x each LE Balloon taps reaching inside/outside BOS with BLE and BUE for coordination, spatial awareness,  and reaction timing x2 minutes Soccer ball kicks LAQ for spatial awareness of LLE x 12 kicks ; second set with increased challenge of making soccer ball go between two cones for additional control of limb x 15 Seated adduction ball squeezes 15x each LE        Pt educated throughout session about proper posture and technique with exercises. Improved exercise technique, movement at target joints, use of target muscles after min to mod verbal, visual, tactile cues                       PT Education - 02/26/20 1401    Education provided Yes    Education  Details negotiation of unstable terrain    Person(s) Educated Patient    Methods Explanation;Demonstration;Tactile cues;Verbal cues    Comprehension Verbalized understanding;Returned demonstration;Verbal cues required;Tactile cues required            PT Short Term Goals - 02/11/20 8588      PT SHORT TERM GOAL #1   Title Patient will be independent in HEP demonstrating successful between session carryover and improve functional mobility and ease of completing ADLs.    Baseline 10/20/19: HEP not administered at evaluation 11/8: HEP compliant    Time 4    Period Weeks    Status Achieved    Target Date 11/17/19      PT SHORT TERM GOAL #2   Title Patient will increase ABC scale score >45% to demonstrate better functional mobility and better confidence with ADLs.    Baseline 10/20/19: 36% 11/8: 43% 11/24: 44% 12/30: 45%    Time 4    Period Weeks    Status Achieved    Target Date 01/07/20             PT Long Term Goals - 02/10/20 0849      PT LONG TERM GOAL #1   Title Patient will increase FOTO score to equal to or greater than 49% to demonstrate statistically significant improvement in mobility and quality of life.    Baseline 10/20/19: 32% 11/24: 50.7%    Time 8    Period Weeks    Status Achieved      PT LONG TERM GOAL #2   Title Patient (> 6 years old) will complete five times sit to stand test in < 15 seconds without UE support indicating an increased LE strength and improved balance.    Baseline 10/20/19: 26.14 seconds with BUE support required 11/8: 22. 43 seconds with heavy BUE support 11/24: 17.43 seconds with heavy BUE support 12/30: 18.63 with heavy BUE support; 02/10/2020 17.36 with B UE support    Time 8    Period Weeks    Status On-going    Target Date 04/06/20      PT LONG TERM GOAL #3   Title Patient will demonstrate an improved Berg Balance Score of >42/56 with minimal need for BUE support as to demonstrate improved balance with ADLs such as sitting/standing and  transfer balance and reduced fall risk.    Baseline 10/20/19: 28/56 with limitations due to fear of falling 11/8: 31/56 11/24: 34/56 12/30: 22/ 56; 02/10/2020 35/56    Time 8    Period Weeks    Status Partially Met    Target Date 04/06/20      PT LONG TERM GOAL #4   Title Patient will increase 10 meter walk test to >1.81ms with improved foot clearance and increased hip flexion in swing phase as  to improve gait speed for better community ambulation and to reduce fall risk.    Baseline 10/20/19: 0.73ms with rollator, minimal hip flexion and foot clearance bilat 11/8: 0.81 m/s with rollator 11/24: 0.88 m/s with rollator 12/30: 9.5 seconds >1.0 m/s    Time 8    Period Weeks    Status Achieved      PT LONG TERM GOAL #5   Title Patient will increase ABC scale score >60% to demonstrate better functional mobility and better confidence with ADLs.    Baseline 10/20/19: 36% 11/8: 43% 11/24: 44% 12/30: 46%; 02/10/2020 43.1%    Time 8    Period Weeks    Status Partially Met    Target Date 04/06/20                 Plan - 02/26/20 1406    Clinical Impression Statement Patient highly motivated throughout session and is able to ambulate for longest duration to date with only two seated rest breaks required. Incline and declines as well as sudden obstacles in pathway do continue to lead to regression of ambulation to shuffle steps and close to near LOB but improves with repetition and as patient becomes more secure. Pt will benefit from further skilled PT to improve strength, mobility and balance to increase safety with all functional activities and improve QOL.    Personal Factors and Comorbidities Time since onset of injury/illness/exacerbation;Age;Comorbidity 3+;Past/Current Experience;Transportation    Comorbidities osteoporosis, anemia, hx cancer, hyperlipidemia    Examination-Activity Limitations Bathing;Bed Mobility;Dressing;Reach Overhead;Stairs;Stand;Toileting;Locomotion  Level;Squat;Transfers;Bend;Hygiene/Grooming;Carry;Sit    Examination-Participation Restrictions Community Activity;Driving;Shop;Yard Work;Cleaning;Meal Prep    Stability/Clinical Decision Making Evolving/Moderate complexity    Rehab Potential Good    PT Frequency 2x / week    PT Duration 8 weeks    PT Treatment/Interventions ADLs/Self Care Home Management;Aquatic Therapy;Cryotherapy;Electrical Stimulation;DME Instruction;Gait training;Stair training;Functional mobility training;Therapeutic activities;Therapeutic exercise;Balance training;Neuromuscular re-education;Patient/family education;Manual techniques;Passive range of motion;Dry needling;Spinal Manipulations;Joint Manipulations;Orthotic Fit/Training;Energy conservation    PT Next Visit Plan assess hamstring and quadricep flexibility, balance and BLE strength training, gait training, give HEP, progress LE power exercises    PT Home Exercise Plan not administered this date           Patient will benefit from skilled therapeutic intervention in order to improve the following deficits and impairments:  Abnormal gait,Decreased balance,Decreased mobility,Difficulty walking,Hypomobility,Decreased strength,Decreased knowledge of use of DME,Decreased coordination,Postural dysfunction,Impaired flexibility,Improper body mechanics,Impaired perceived functional ability,Decreased activity tolerance  Visit Diagnosis: Unsteadiness on feet  Abnormality of gait and mobility  Muscle weakness (generalized)     Problem List Patient Active Problem List   Diagnosis Date Noted  . Cognitive deficits   . Labile blood pressure   . Acute blood loss anemia   . Slow transit constipation   . Vascular headache   . 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 [place] ([place] 07/03/2017  . 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 Pelvic fracture ([place] 04/18/2016  . CD (celiac disease) 08/04/2014  . Cancer of upper lobe of right lung ([place] 08/04/2014   . Age related osteoporosis 11/26/2013  . Absolute anemia 05/21/2013  . H/O malignant neoplasm 05/21/2013  . HLD (hyperlipidemia) 05/21/2013   MJanna Arch PT, DPT   02/26/2020, 2:07 PM  [place] [facility] [address] [address]Phone: [phone]  Fax:  [phone] Name: MTEJASVI BRISSETTMRN: [id number] of Birth: 302-05-1945

## 2020-03-02 ENCOUNTER — Ambulatory Visit: Payer: PPO

## 2020-03-02 ENCOUNTER — Other Ambulatory Visit: Payer: Self-pay

## 2020-03-02 DIAGNOSIS — R269 Unspecified abnormalities of gait and mobility: Secondary | ICD-10-CM

## 2020-03-02 DIAGNOSIS — M6281 Muscle weakness (generalized): Secondary | ICD-10-CM

## 2020-03-02 DIAGNOSIS — R2681 Unsteadiness on feet: Secondary | ICD-10-CM | POA: Diagnosis not present

## 2020-03-02 NOTE — Therapy (Signed)
[place] MAIN [facility] SERVICES [address] [place], [place], [address] Phone: [phone]   Fax:  [phone]  Physical Therapy Treatment  Patient Details  Name: Krista Evans MRN: [id number] Date of Birth: July 30, 1945 Referring Provider (PT): Ramonita Lab   Encounter Date: 03/02/2020   PT End of Session - 03/02/20 0851    Visit Number 29    Number of Visits 41    Date for PT Re-Evaluation 04/06/20    Authorization Type 9/10 PN 1/4    PT Start Time 0846    PT Stop Time 0929    PT Time Calculation (min) 43 min    Equipment Utilized During Treatment Gait belt    Activity Tolerance Patient tolerated treatment well    Behavior During Therapy [facility] for tasks assessed/performed           Past Medical History:  Diagnosis Date  . Abnormal Q waves on electrocardiogram   . Anemia   . Aortic atherosclerosis ([place])    "I could possibly have it, my grandmother had it"  . Cancer [facility]) 05/2011   Right upper Lung CA with partial Lobectomy.  . Celiac disease   . Celiac disease   . Dyspnea    with exertion  . Hyperlipidemia   . Hyperlipidemia   . Lung mass   . Meningioma ([place])   . Osteoporosis   . Personal history of chemotherapy   . Personal history of radiation therapy     Past Surgical History:  Procedure Laterality Date  . LUNG LOBECTOMY     right lung  . VENTRICULOPERITONEAL SHUNT Right 07/03/2017   Procedure: SHUNT INSERTION VENTRICULAR-PERITONEAL;  Surgeon: Newman Pies, MD;  Location: [place];  Service: Neurosurgery;  Laterality: Right;    There were no vitals filed for this visit.   Subjective Assessment - 03/02/20 0850    Subjective Patient reports no falls or LOB since last session. Did her HEP over the weekend.    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 Patient has attended OP PT in the past year for LLE strengthening and balance with positive  results, but has not been in attendance recently due to COVID-19 pandemic. PMH includes osteoporosis, anemia, celiac disease, hyperlipidemia, hx of R upper lobe mass with resection 6/13, hx of pneumonia, meningioma, major atherosclerosis. VP shunt has been placed 06/2017.    Limitations Lifting;House hold activities;Standing;Walking    How long can you sit comfortably? n/a    How long can you stand comfortably? ~10 minutes (AM better than PM)    How long can you walk comfortably? a few mins until standing rest break with LLE off ground    Patient Stated Goals "to walk by myself"    Currently in Pain? No/denies                 neuro re-ed By support bar:  cone taps with BUE support, attempt at SUE support unsuccessful, however able to perform with BUE support and PT cueing for sequencing : color and LE 15x each LE   SUE support on bar with opp UE holding ball against PT hand 6" step toe taps, very challenging 10x each LEmax cueing for knee flexion  Speed ladder: one foot each square BUE support for step length cueing and equal body mechanics max cueing for sequencing of arm placement due to preference to drag arms behind body x 12 lengths of // bars  airex pad: static stand spatial awareness no UE support 60 seconds x 2 trials    KoreBalance penguin intervention for weight shift, spatial awareness and standing stabilization against pertubation/changing surfaces 3x 2 minutes. Patient requires mod A for sequencing to get onto/off machine     Therex:  Nustep Lvl 1-3 4 minutes RPM> 60 for cardiovascular and musculoskeletal challenge. ; LE only  precor leg press:  Bilateral LE leg press #40 15x, 2 sets ; cues for not hyperextending LE's  LLE only #25 10x ; cues for not hyperextending LE's    Seated hamstring stretch 2x30 second holds     Patient's vitals monitored throughout the session.  Pt educated throughout session about proper posture and technique with exercises. Improved  exercise technique, movement at target joints, use of target muscles after min to mod verbal, visual, tactile cues                       PT Education - 03/02/20 0851    Education provided Yes    Education Details exercise technique, body mechanics    Person(s) Educated Patient    Methods Explanation;Demonstration;Tactile cues;Verbal cues    Comprehension Verbalized understanding;Returned demonstration;Verbal cues required;Tactile cues required            PT Short Term Goals - 02/11/20 7253      PT SHORT TERM GOAL #1   Title Patient will be independent in HEP demonstrating successful between session carryover and improve functional mobility and ease of completing ADLs.    Baseline 10/20/19: HEP not administered at evaluation 11/8: HEP compliant    Time 4    Period Weeks    Status Achieved    Target Date 11/17/19      PT SHORT TERM GOAL #2   Title Patient will increase ABC scale score >45% to demonstrate better functional mobility and better confidence with ADLs.    Baseline 10/20/19: 36% 11/8: 43% 11/24: 44% 12/30: 45%    Time 4    Period Weeks    Status Achieved    Target Date 01/07/20             PT Long Term Goals - 02/10/20 0849      PT LONG TERM GOAL #1   Title Patient will increase FOTO score to equal to or greater than 49% to demonstrate statistically significant improvement in mobility and quality of life.    Baseline 10/20/19: 32% 11/24: 50.7%    Time 8    Period Weeks    Status Achieved      PT LONG TERM GOAL #2   Title Patient (> 21 years old) will complete five times sit to stand test in < 15 seconds without UE support indicating an increased LE strength and improved balance.    Baseline 10/20/19: 26.14 seconds with BUE support required 11/8: 22. 43 seconds with heavy BUE support 11/24: 17.43 seconds with heavy BUE support 12/30: 18.63 with heavy BUE support; 02/10/2020 17.36 with B UE support    Time 8    Period Weeks    Status On-going     Target Date 04/06/20      PT LONG TERM GOAL #3   Title Patient will demonstrate an improved Berg Balance Score of >42/56 with minimal need for BUE support as to demonstrate improved balance with ADLs such as sitting/standing and transfer balance and reduced fall risk.    Baseline 10/20/19: 28/56 with limitations due to fear of falling 11/8: 31/56  11/24: 34/56 12/30: 22/ 56; 02/10/2020 35/56    Time 8    Period Weeks    Status Partially Met    Target Date 04/06/20      PT LONG TERM GOAL #4   Title Patient will increase 10 meter walk test to >1.39ms with improved foot clearance and increased hip flexion in swing phase as to improve gait speed for better community ambulation and to reduce fall risk.    Baseline 10/20/19: 0.646m with rollator, minimal hip flexion and foot clearance bilat 11/8: 0.81 m/s with rollator 11/24: 0.88 m/s with rollator 12/30: 9.5 seconds >1.0 m/s    Time 8    Period Weeks    Status Achieved      PT LONG TERM GOAL #5   Title Patient will increase ABC scale score >60% to demonstrate better functional mobility and better confidence with ADLs.    Baseline 10/20/19: 36% 11/8: 43% 11/24: 44% 12/30: 46%; 02/10/2020 43.1%    Time 8    Period Weeks    Status Partially Met    Target Date 04/06/20                 Plan - 03/02/20 1040    Clinical Impression Statement Patient introduced to [place] was very challenged with negotiating onto/off of machien inititially. The leg press machine requires assistance with set up as well as it is challenging for patient to place feet on plate. Pt will benefit from further skilled PT to improve strength, mobility and balance to increase safety with all functional activities and improve QOL    Personal Factors and Comorbidities Time since onset of injury/illness/exacerbation;Age;Comorbidity 3+;Past/Current Experience;Transportation    Comorbidities osteoporosis, anemia, hx cancer, hyperlipidemia    Examination-Activity  Limitations Bathing;Bed Mobility;Dressing;Reach Overhead;Stairs;Stand;Toileting;Locomotion Level;Squat;Transfers;Bend;Hygiene/Grooming;Carry;Sit    Examination-Participation Restrictions Community Activity;Driving;Shop;Yard Work;Cleaning;Meal Prep    Stability/Clinical Decision Making Evolving/Moderate complexity    Rehab Potential Good    PT Frequency 2x / week    PT Duration 8 weeks    PT Treatment/Interventions ADLs/Self Care Home Management;Aquatic Therapy;Cryotherapy;Electrical Stimulation;DME Instruction;Gait training;Stair training;Functional mobility training;Therapeutic activities;Therapeutic exercise;Balance training;Neuromuscular re-education;Patient/family education;Manual techniques;Passive range of motion;Dry needling;Spinal Manipulations;Joint Manipulations;Orthotic Fit/Training;Energy conservation    PT Next Visit Plan assess hamstring and quadricep flexibility, balance and BLE strength training, gait training, give HEP, progress LE power exercises    PT Home Exercise Plan not administered this date           Patient will benefit from skilled therapeutic intervention in order to improve the following deficits and impairments:  Abnormal gait,Decreased balance,Decreased mobility,Difficulty walking,Hypomobility,Decreased strength,Decreased knowledge of use of DME,Decreased coordination,Postural dysfunction,Impaired flexibility,Improper body mechanics,Impaired perceived functional ability,Decreased activity tolerance  Visit Diagnosis: Unsteadiness on feet  Abnormality of gait and mobility  Muscle weakness (generalized)     Problem List Patient Active Problem List   Diagnosis Date Noted  . Cognitive deficits   . Labile blood pressure   . Acute blood loss anemia   . Slow transit constipation   . Vascular headache   . 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 Hydrocephalus (HCNenz[place]06/18/2019  . Communicating hydrocephalus (HCLauniupoko06/18/2019  . Pelvic fracture ([place]CPhe[place]04/03/2016   . CD (celiac disease) 08/04/2014  . Cancer of upper lobe of right lung (HCGlen Burnie07/19/2016  . Age related osteoporosis 11/26/2013  . Absolute anemia 05/21/2013  . H/O malignant neoplasm 05/21/2013  . HLD (hyperlipidemia) 05/21/2013   MaJanna ArchPT, DPT   03/02/2020, 10:43 AM  [place]  [place], [place], [address] Phone: [phone]   Fax:  [phone]  Name: DAWNNA GRITZ MRN: [id number] Date of Birth: 1945/02/28

## 2020-03-04 ENCOUNTER — Ambulatory Visit: Payer: PPO

## 2020-03-04 ENCOUNTER — Other Ambulatory Visit: Payer: Self-pay

## 2020-03-04 DIAGNOSIS — R269 Unspecified abnormalities of gait and mobility: Secondary | ICD-10-CM

## 2020-03-04 DIAGNOSIS — R2681 Unsteadiness on feet: Secondary | ICD-10-CM

## 2020-03-04 DIAGNOSIS — M6281 Muscle weakness (generalized): Secondary | ICD-10-CM

## 2020-03-04 NOTE — Therapy (Signed)
[place] MAIN [facility] SERVICES [address] [place], [place], [address] Phone: [phone]   Fax:  [phone]  Physical Therapy Treatment/Physical Therapy Progress Note   Dates of reporting period  01/15/2020  to  03/04/2020  Patient Details  Name: Krista Evans MRN: [id number] Date of Birth: [birth year] Referring Provider (PT): Ramonita Lab   Encounter Date: 03/04/2020   PT End of Session - 03/04/20 0848    Visit Number 30    Number of Visits 41    Date for PT Re-Evaluation 04/06/20    Authorization Type 9/10 PN 1/4    PT Start Time 0843    PT Stop Time 0929    PT Time Calculation (min) 46 min    Equipment Utilized During Treatment Gait belt    Activity Tolerance Patient tolerated treatment well    Behavior During Therapy [facility] for tasks assessed/performed           Past Medical History:  Diagnosis Date  . Abnormal Q waves on electrocardiogram   . Anemia   . Aortic atherosclerosis ([place])    "I could possibly have it, my grandmother had it"  . Cancer [facility]) 05/2011   Right upper Lung CA with partial Lobectomy.  . Celiac disease   . Celiac disease   . Dyspnea    with exertion  . Hyperlipidemia   . Hyperlipidemia   . Lung mass   . Meningioma ([place])   . Osteoporosis   . Personal history of chemotherapy   . Personal history of radiation therapy     Past Surgical History:  Procedure Laterality Date  . LUNG LOBECTOMY     right lung  . VENTRICULOPERITONEAL SHUNT Right 07/03/2017   Procedure: SHUNT INSERTION VENTRICULAR-PERITONEAL;  Surgeon: Newman Pies, MD;  Location: [place];  Service: Neurosurgery;  Laterality: Right;    There were no vitals filed for this visit.   Subjective Assessment - 03/04/20 0845    Subjective Patient reports having a good week so far. Denies any pain or falls.    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 Patient has attended  OP PT in the past year for LLE strengthening and balance with positive results, but has not been in attendance recently due to COVID-19 pandemic. PMH includes osteoporosis, anemia, celiac disease, hyperlipidemia, hx of R upper lobe mass with resection 6/13, hx of pneumonia, meningioma, major atherosclerosis. VP shunt has been placed 06/2017.    Limitations Lifting;House hold activities;Standing;Walking    How long can you sit comfortably? n/a    How long can you stand comfortably? ~10 minutes (AM better than PM)    How long can you walk comfortably? a few mins until standing rest break with LLE off ground    Patient Stated Goals "to walk by myself"    Currently in Pain? No/denies                Reassessed goals today- See goals for details.   Neuromuscular re-education:  Patient performed static stand at bar with feet together and no UE support x 30 sec followed by feet staggered x 30 sec each leg. Patient required cues for feet placement and presented with intermittent unsteadiness initially yet did improve with practice.                      PT Education - 03/04/20 1956    Education Details safety education with  balance activites    Person(s) Educated Patient    Methods Explanation;Demonstration;Verbal cues;Tactile cues    Comprehension Verbalized understanding;Returned demonstration;Tactile cues required;Need further instruction;Verbal cues required            PT Short Term Goals - 02/11/20 4627      PT SHORT TERM GOAL #1   Title Patient will be independent in HEP demonstrating successful between session carryover and improve functional mobility and ease of completing ADLs.    Baseline 10/20/19: HEP not administered at evaluation 11/8: HEP compliant    Time 4    Period Weeks    Status Achieved    Target Date 11/17/19      PT SHORT TERM GOAL #2   Title Patient will increase ABC scale score >45% to demonstrate better functional mobility and better  confidence with ADLs.    Baseline 10/20/19: 36% 11/8: 43% 11/24: 44% 12/30: 45%    Time 4    Period Weeks    Status Achieved    Target Date 01/07/20             PT Long Term Goals - 03/04/20 0849      PT LONG TERM GOAL #1   Title Patient will increase FOTO score to equal to or greater than 49% to demonstrate statistically significant improvement in mobility and quality of life.    Baseline 10/20/19: 32% 11/24: 50.7%    Time 8    Period Weeks    Status Achieved      PT LONG TERM GOAL #2   Title Patient (> 53 years old) will complete five times sit to stand test in < 15 seconds without UE support indicating an increased LE strength and improved balance.    Baseline 10/20/19: 26.14 seconds with BUE support required 11/8: 22. 43 seconds with heavy BUE support 11/24: 17.43 seconds with heavy BUE support 12/30: 18.63 with heavy BUE support; 02/10/2020 17.36 with B UE support; 03/04/2020= 17.02 sec. with BUE support    Time 8    Period Weeks    Status On-going    Target Date 04/06/20      PT LONG TERM GOAL #3   Title Patient will demonstrate an improved Berg Balance Score of >42/56 with minimal need for BUE support as to demonstrate improved balance with ADLs such as sitting/standing and transfer balance and reduced fall risk.    Baseline 10/20/19: 28/56 with limitations due to fear of falling 11/8: 31/56 11/24: 34/56 12/30: 22/ 56; 02/10/2020 35/56; 03/04/2020=37/56    Time 8    Period Weeks    Status Partially Met    Target Date 04/06/20      PT LONG TERM GOAL #4   Title Patient will increase 10 meter walk test to >1.58ms with improved foot clearance and increased hip flexion in swing phase as to improve gait speed for better community ambulation and to reduce fall risk.    Baseline 10/20/19: 0.639m with rollator, minimal hip flexion and foot clearance bilat 11/8: 0.81 m/s with rollator 11/24: 0.88 m/s with rollator 12/30: 9.5 seconds >1.0 m/s    Time 8    Period Weeks    Status Achieved       PT LONG TERM GOAL #5   Title Patient will increase ABC scale score >60% to demonstrate better functional mobility and better confidence with ADLs.    Baseline 10/20/19: 36% 11/8: 43% 11/24: 44% 12/30: 46%; 02/10/2020 43.1%; 03/04/2020= 45%    Time 8    Period  Weeks    Status Partially Met                 Plan - 03/04/20 1958    Clinical Impression Statement Patient reassessed today and demonstrated improvement in 5x Sit to stand test,  BERG balance test score and the ABC score.  She continues to present with increased fall risk and instructed to continue to use her rollator for all mobility. Patient acknowledges the fact that her balance still has a ways to go and agreeable to continuing PT services focusing on progressing LE strengthening and balance. Patient's condition has the potential to improve in response to therapy. Maximum improvement is yet to be obtained. The anticipated improvement is attainable and reasonable in a generally predictable time.    Personal Factors and Comorbidities Time since onset of injury/illness/exacerbation;Age;Comorbidity 3+;Past/Current Experience;Transportation    Comorbidities osteoporosis, anemia, hx cancer, hyperlipidemia    Examination-Activity Limitations Bathing;Bed Mobility;Dressing;Reach Overhead;Stairs;Stand;Toileting;Locomotion Level;Squat;Transfers;Bend;Hygiene/Grooming;Carry;Sit    Examination-Participation Restrictions Community Activity;Driving;Shop;Yard Work;Cleaning;Meal Prep    Stability/Clinical Decision Making Evolving/Moderate complexity    Rehab Potential Good    PT Frequency 2x / week    PT Duration 8 weeks    PT Treatment/Interventions ADLs/Self Care Home Management;Aquatic Therapy;Cryotherapy;Electrical Stimulation;DME Instruction;Gait training;Stair training;Functional mobility training;Therapeutic activities;Therapeutic exercise;Balance training;Neuromuscular re-education;Patient/family education;Manual techniques;Passive  range of motion;Dry needling;Spinal Manipulations;Joint Manipulations;Orthotic Fit/Training;Energy conservation    PT Next Visit Plan Continue with static/dynamic balance activities and progressive LE strengthening as appropriate.    PT Home Exercise Plan not administered this date    Consulted and Agree with Plan of Care Patient           Patient will benefit from skilled therapeutic intervention in order to improve the following deficits and impairments:  Abnormal gait,Decreased balance,Decreased mobility,Difficulty walking,Hypomobility,Decreased strength,Decreased knowledge of use of DME,Decreased coordination,Postural dysfunction,Impaired flexibility,Improper body mechanics,Impaired perceived functional ability,Decreased activity tolerance  Visit Diagnosis: Unsteadiness on feet  Abnormality of gait and mobility  Muscle weakness (generalized)  Neurologic gait disorder     Problem List Patient Active Problem List   Diagnosis Date Noted  . Cognitive deficits   . Labile blood pressure   . Acute blood loss anemia   . Slow transit constipation   . Vascular headache   . 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 Hydrocephalus ([place]) 07/03/2017  . 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 Pelvic fracture ([place]) 04/18/2016  . CD (celiac disease) 08/04/2014  . Cancer of upper lobe of right lung ([place]) 08/04/2014  . Age related osteoporosis 11/26/2013  . Absolute anemia 05/21/2013  . H/O malignant neoplasm 05/21/2013  . HLD (hyperlipidemia) 05/21/2013    Lewis Moccasin, PT 03/05/2020, 11:35 AM  [place] MAIN [facility] SERVICES [address] [place], [place], [address] Phone: [phone]   Fax:  [phone]  Name: Krista Evans MRN: [id number] Date of Birth: 1945-03-12

## 2020-03-09 ENCOUNTER — Other Ambulatory Visit: Payer: Self-pay

## 2020-03-09 ENCOUNTER — Ambulatory Visit: Payer: PPO

## 2020-03-09 DIAGNOSIS — R2681 Unsteadiness on feet: Secondary | ICD-10-CM | POA: Diagnosis not present

## 2020-03-09 DIAGNOSIS — M6281 Muscle weakness (generalized): Secondary | ICD-10-CM

## 2020-03-09 DIAGNOSIS — R269 Unspecified abnormalities of gait and mobility: Secondary | ICD-10-CM

## 2020-03-09 NOTE — Therapy (Signed)
[place] MAIN [facility] SERVICES [address] [place], [place], [address] Phone: [phone]   Fax:  [phone]  Physical Therapy Treatment  Patient Details  Name: Krista Evans MRN: [id number] Date of Birth: 1945-12-27 Referring Provider (PT): Ramonita Lab   Encounter Date: 03/09/2020   PT End of Session - 03/09/20 1434    Visit Number 31    Number of Visits 41    Date for PT Re-Evaluation 04/06/20    Authorization Type 1/10 PN 2/17    PT Start Time 1428    PT Stop Time 1514    PT Time Calculation (min) 46 min    Equipment Utilized During Treatment Gait belt    Activity Tolerance Patient tolerated treatment well    Behavior During Therapy [facility] for tasks assessed/performed           Past Medical History:  Diagnosis Date  . Abnormal Q waves on electrocardiogram   . Anemia   . Aortic atherosclerosis ([place])    "I could possibly have it, my grandmother had it"  . Cancer [facility]) 05/2011   Right upper Lung CA with partial Lobectomy.  . Celiac disease   . Celiac disease   . Dyspnea    with exertion  . Hyperlipidemia   . Hyperlipidemia   . Lung mass   . Meningioma ([place])   . Osteoporosis   . Personal history of chemotherapy   . Personal history of radiation therapy     Past Surgical History:  Procedure Laterality Date  . LUNG LOBECTOMY     right lung  . VENTRICULOPERITONEAL SHUNT Right 07/03/2017   Procedure: SHUNT INSERTION VENTRICULAR-PERITONEAL;  Surgeon: Newman Pies, MD;  Location: [place];  Service: Neurosurgery;  Laterality: Right;    There were no vitals filed for this visit.   Subjective Assessment - 03/09/20 1433    Subjective Patient reports compliance with HEP. No falls or LOB since last session.    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 Patient has attended OP PT in the past year for LLE strengthening and balance with positive results, but  has not been in attendance recently due to COVID-19 pandemic. PMH includes osteoporosis, anemia, celiac disease, hyperlipidemia, hx of R upper lobe mass with resection 6/13, hx of pneumonia, meningioma, major atherosclerosis. VP shunt has been placed 06/2017.    Limitations Lifting;House hold activities;Standing;Walking    How long can you sit comfortably? n/a    How long can you stand comfortably? ~10 minutes (AM better than PM)    How long can you walk comfortably? a few mins until standing rest break with LLE off ground    Patient Stated Goals "to walk by myself"    Currently in Pain? No/denies                 neuro re-ed By support bar:   airex pad: static stand spatial awareness no UE support 60 seconds x 2 trials   Step over and back laterally orange hurdle 15x each side, heavy UE support Forward step over and back single limb 15x each LE, heavy BUE support      Therex:  Nustep Lvl 1-3 4 minutes RPM> 60 for cardiovascular and musculoskeletal challenge. ; LE only   Ambulate >700 ft with rollator and close CGA, patient requires cueing for step length, reduction of freezing when changing rooms or approaching obstacles.Cues for negotiationof walker required for negotiation of  obstacles in pathway.  2lb ankle weight standing: -marches with heavy BUE support 12x each LE 2lb ankle weight seated: -LAQ 15x each LE -march 12x each LE    Sit to stand 10x with UE support, cues for full sit and full stand rather than tricep pushup.   Seated hamstring stretch 2x30 second holds       Patient's vitals monitored throughout the session.  Pt educated throughout session about proper posture and technique with exercises. Improved exercise technique, movement at target joints, use of target muscles after min to mod verbal, visual, tactile cues    Patient continues to remain highly motivated throughout physical therapy session. She is challenged with bilateral foot clearance with  mobility with preference for shuffle step, she has poor carryover when she fatigues with quick regression to shuffle steps. Pt will benefit from further skilled PT to improve strength, mobility and balance to increase safety with all functional activities and improve QOL             PT Education - 03/09/20 1433    Education provided Yes    Education Details exercise technique, body mechanics    Person(s) Educated Patient    Methods Explanation;Demonstration;Tactile cues;Verbal cues    Comprehension Verbalized understanding;Returned demonstration;Verbal cues required;Tactile cues required            PT Short Term Goals - 02/11/20 2694      PT SHORT TERM GOAL #1   Title Patient will be independent in HEP demonstrating successful between session carryover and improve functional mobility and ease of completing ADLs.    Baseline 10/20/19: HEP not administered at evaluation 11/8: HEP compliant    Time 4    Period Weeks    Status Achieved    Target Date 11/17/19      PT SHORT TERM GOAL #2   Title Patient will increase ABC scale score >45% to demonstrate better functional mobility and better confidence with ADLs.    Baseline 10/20/19: 36% 11/8: 43% 11/24: 44% 12/30: 45%    Time 4    Period Weeks    Status Achieved    Target Date 01/07/20             PT Long Term Goals - 03/04/20 0849      PT LONG TERM GOAL #1   Title Patient will increase FOTO score to equal to or greater than 49% to demonstrate statistically significant improvement in mobility and quality of life.    Baseline 10/20/19: 32% 11/24: 50.7%    Time 8    Period Weeks    Status Achieved      PT LONG TERM GOAL #2   Title Patient (> 70 years old) will complete five times sit to stand test in < 15 seconds without UE support indicating an increased LE strength and improved balance.    Baseline 10/20/19: 26.14 seconds with BUE support required 11/8: 22. 43 seconds with heavy BUE support 11/24: 17.43 seconds with  heavy BUE support 12/30: 18.63 with heavy BUE support; 02/10/2020 17.36 with B UE support; 03/04/2020= 17.02 sec. with BUE support    Time 8    Period Weeks    Status On-going    Target Date 04/06/20      PT LONG TERM GOAL #3   Title Patient will demonstrate an improved Berg Balance Score of >42/56 with minimal need for BUE support as to demonstrate improved balance with ADLs such as sitting/standing and transfer balance and reduced fall risk.  Baseline 10/20/19: 28/56 with limitations due to fear of falling 11/8: 31/56 11/24: 34/56 12/30: 22/ 56; 02/10/2020 35/56; 03/04/2020=37/56    Time 8    Period Weeks    Status Partially Met    Target Date 04/06/20      PT LONG TERM GOAL #4   Title Patient will increase 10 meter walk test to >1.32ms with improved foot clearance and increased hip flexion in swing phase as to improve gait speed for better community ambulation and to reduce fall risk.    Baseline 10/20/19: 0.654m with rollator, minimal hip flexion and foot clearance bilat 11/8: 0.81 m/s with rollator 11/24: 0.88 m/s with rollator 12/30: 9.5 seconds >1.0 m/s    Time 8    Period Weeks    Status Achieved      PT LONG TERM GOAL #5   Title Patient will increase ABC scale score >60% to demonstrate better functional mobility and better confidence with ADLs.    Baseline 10/20/19: 36% 11/8: 43% 11/24: 44% 12/30: 46%; 02/10/2020 43.1%; 03/04/2020= 45%    Time 8    Period Weeks    Status Partially Met                 Plan - 03/09/20 1540    Clinical Impression Statement Patient continues to remain highly motivated throughout physical therapy session. She is challenged with bilateral foot clearance with mobility with preference for shuffle step, she has poor carryover when she fatigues with quick regression to shuffle steps. Pt will benefit from further skilled PT to improve strength, mobility and balance to increase safety with all functional activities and improve QOL    Personal Factors  and Comorbidities Time since onset of injury/illness/exacerbation;Age;Comorbidity 3+;Past/Current Experience;Transportation    Comorbidities osteoporosis, anemia, hx cancer, hyperlipidemia    Examination-Activity Limitations Bathing;Bed Mobility;Dressing;Reach Overhead;Stairs;Stand;Toileting;Locomotion Level;Squat;Transfers;Bend;Hygiene/Grooming;Carry;Sit    Examination-Participation Restrictions Community Activity;Driving;Shop;Yard Work;Cleaning;Meal Prep    Stability/Clinical Decision Making Evolving/Moderate complexity    Rehab Potential Good    PT Frequency 2x / week    PT Duration 8 weeks    PT Treatment/Interventions ADLs/Self Care Home Management;Aquatic Therapy;Cryotherapy;Electrical Stimulation;DME Instruction;Gait training;Stair training;Functional mobility training;Therapeutic activities;Therapeutic exercise;Balance training;Neuromuscular re-education;Patient/family education;Manual techniques;Passive range of motion;Dry needling;Spinal Manipulations;Joint Manipulations;Orthotic Fit/Training;Energy conservation    PT Next Visit Plan Continue with static/dynamic balance activities and progressive LE strengthening as appropriate.    PT Home Exercise Plan not administered this date    Consulted and Agree with Plan of Care Patient           Patient will benefit from skilled therapeutic intervention in order to improve the following deficits and impairments:  Abnormal gait,Decreased balance,Decreased mobility,Difficulty walking,Hypomobility,Decreased strength,Decreased knowledge of use of DME,Decreased coordination,Postural dysfunction,Impaired flexibility,Improper body mechanics,Impaired perceived functional ability,Decreased activity tolerance  Visit Diagnosis: Unsteadiness on feet  Abnormality of gait and mobility  Muscle weakness (generalized)     Problem List Patient Active Problem List   Diagnosis Date Noted  . Cognitive deficits   . Labile blood pressure   . Acute blood  loss anemia   . Slow transit constipation   . Vascular headache   . 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 [place] ([place]/18[place]  . Communicating hydrocephalus ([place]06/18/2019  . [place] ([place]04/03/2016  . CD (celiac disease) 08/04/2014  . [place] of right [place] ([place] [place]/19[place]2016  . Age related osteoporosis 11/26/2013  . Absolute anemia 05/21/2013  . H/O malignant neoplasm 05/21/2013  . HLD (hyperlipidemia) 05/21/2013   MaJanna ArchPT, DPT  03/09/2020, 3:43 PM  [place] MAIN [facility] SERVICES [address] [place], [place], [address] Phone: [phone]   Fax:  [phone]  Name: LOVENA KLUCK MRN: [id number] Date of Birth: April 28, 1945

## 2020-03-11 ENCOUNTER — Ambulatory Visit: Payer: PPO

## 2020-03-16 ENCOUNTER — Ambulatory Visit: Payer: PPO | Attending: Internal Medicine

## 2020-03-16 ENCOUNTER — Other Ambulatory Visit: Payer: Self-pay

## 2020-03-16 DIAGNOSIS — R269 Unspecified abnormalities of gait and mobility: Secondary | ICD-10-CM | POA: Diagnosis not present

## 2020-03-16 DIAGNOSIS — M6281 Muscle weakness (generalized): Secondary | ICD-10-CM | POA: Diagnosis not present

## 2020-03-16 DIAGNOSIS — R2681 Unsteadiness on feet: Secondary | ICD-10-CM | POA: Insufficient documentation

## 2020-03-16 NOTE — Therapy (Signed)
[place] MAIN [facility] SERVICES [address] [place], [place], [address] Phone: [phone]   Fax:  [phone]  Physical Therapy Treatment  Patient Details  Name: Krista Evans MRN: [id number] Date of Birth: Feb 06, 1945 Referring Provider (PT): Ramonita Lab   Encounter Date: 03/16/2020   PT End of Session - 03/16/20 1024    Visit Number 32    Number of Visits 41    Date for PT Re-Evaluation 04/06/20    Authorization Type 2/10 PN 2/17    PT Start Time 1116    PT Stop Time 1201    PT Time Calculation (min) 45 min    Equipment Utilized During Treatment Gait belt    Activity Tolerance Patient tolerated treatment well    Behavior During Therapy WFL for tasks assessed/performed           Past Medical History:  Diagnosis Date  . Abnormal Q waves on electrocardiogram   . Anemia   . Aortic atherosclerosis ([place])    "I could possibly have it, my grandmother had it"  . Cancer [facility]) 05/2011   Right upper Lung CA with partial Lobectomy.  . Celiac disease   . Celiac disease   . Dyspnea    with exertion  . Hyperlipidemia   . Hyperlipidemia   . Lung mass   . Meningioma ([place])   . Osteoporosis   . Personal history of chemotherapy   . Personal history of radiation therapy     Past Surgical History:  Procedure Laterality Date  . LUNG LOBECTOMY     right lung  . VENTRICULOPERITONEAL SHUNT Right 07/03/2017   Procedure: SHUNT INSERTION VENTRICULAR-PERITONEAL;  Surgeon: Newman Pies, MD;  Location: [place];  Service: Neurosurgery;  Laterality: Right;    There were no vitals filed for this visit.   Subjective Assessment - 03/16/20 1022    Subjective Pt reports she canceled previous episode due to husband having a cold. No soreness from previous session. Pt reports no falls from previous session and compliant with HEP.    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 Patient  has attended OP PT in the past year for LLE strengthening and balance with positive results, but has not been in attendance recently due to COVID-19 pandemic. PMH includes osteoporosis, anemia, celiac disease, hyperlipidemia, hx of R upper lobe mass with resection 6/13, hx of pneumonia, meningioma, major atherosclerosis. VP shunt has been placed 06/2017.    Limitations Lifting;House hold activities;Standing;Walking    How long can you sit comfortably? n/a    How long can you stand comfortably? ~10 minutes (AM better than PM)    How long can you walk comfortably? a few mins until standing rest break with LLE off ground    Patient Stated Goals "to walk by myself"    Currently in Pain? No/denies    Pain Score 0-No pain           Therex:  Nu-step L3 for 5 min maintaining SPM > 60 for improved LE strength and endurance. SPO2: 97%, HR: 96 BPM  Gait ~700" with rollator and CGA+1 for improving wlaking capacity. Noted shuffling and festinating gait with slowing of gait speed and turning. Mod verbal cuing required for improving equal step lengths to reduce shuffling with fair carryover. 2 standing rest breaks required. SPO2: 98% and HR: 89-92 BPM with each rest break.   After seated rest for 2 min, HR returned to  74 BPM.  Seated LLE resisted exercise with GTB x10/each   Hamstring curls   Hip add   Hip abd   Good form/technique with seated resisted exercise. Min verbal cuing required for targeted muscle movements with great carryover after cuing.   Neuro Re-Ed:    Practice changes in gait speed with rollator using 4 cones in hallway spaced out over 20'. Use of green cones for "go" or increase in speed and red cones for "stop" or slowing down gait speed as visual cue. x4 practicing fast/slow changes in gait speed to reduce shuffling of gait. Good ability to speed up and slow down with reduction shuffling gait with use of visual cues.    Practice figure 8 turning around 2 cone spaced out over 20' in  hallway using rollator. Noticeable difficulty raising feet along turns. Pt reports of difficulty lifting feet up after VC of performing marches. x2 figure 8's.    CGA+1 throughout all gait activities with use of mod VC and use of visual cueing to improve patient's gait mechanics. Pt still displays shuffling gait after removal of visual cues and with surface changes along with turning. Pt educated on safe STS and stand to sit transfers due to insufficient brakes on pt's rollator. Pt displayed excellent safety with these transfers in clinic with therapist supervision.      PT Education - 03/16/20 1024    Education provided Yes    Education Details exercise and gait technique.    Person(s) Educated Patient    Methods Explanation;Demonstration;Tactile cues;Verbal cues    Comprehension Verbalized understanding;Returned demonstration            PT Short Term Goals - 02/11/20 4098      PT SHORT TERM GOAL #1   Title Patient will be independent in HEP demonstrating successful between session carryover and improve functional mobility and ease of completing ADLs.    Baseline 10/20/19: HEP not administered at evaluation 11/8: HEP compliant    Time 4    Period Weeks    Status Achieved    Target Date 11/17/19      PT SHORT TERM GOAL #2   Title Patient will increase ABC scale score >45% to demonstrate better functional mobility and better confidence with ADLs.    Baseline 10/20/19: 36% 11/8: 43% 11/24: 44% 12/30: 45%    Time 4    Period Weeks    Status Achieved    Target Date 01/07/20             PT Long Term Goals - 03/04/20 0849      PT LONG TERM GOAL #1   Title Patient will increase FOTO score to equal to or greater than 49% to demonstrate statistically significant improvement in mobility and quality of life.    Baseline 10/20/19: 32% 11/24: 50.7%    Time 8    Period Weeks    Status Achieved      PT LONG TERM GOAL #2   Title Patient (> 38 years old) will complete five times sit to  stand test in < 15 seconds without UE support indicating an increased LE strength and improved balance.    Baseline 10/20/19: 26.14 seconds with BUE support required 11/8: 22. 43 seconds with heavy BUE support 11/24: 17.43 seconds with heavy BUE support 12/30: 18.63 with heavy BUE support; 02/10/2020 17.36 with B UE support; 03/04/2020= 17.02 sec. with BUE support    Time 8    Period Weeks    Status On-going  Target Date 04/06/20      PT LONG TERM GOAL #3   Title Patient will demonstrate an improved Berg Balance Score of >42/56 with minimal need for BUE support as to demonstrate improved balance with ADLs such as sitting/standing and transfer balance and reduced fall risk.    Baseline 10/20/19: 28/56 with limitations due to fear of falling 11/8: 31/56 11/24: 34/56 12/30: 22/ 56; 02/10/2020 35/56; 03/04/2020=37/56    Time 8    Period Weeks    Status Partially Met    Target Date 04/06/20      PT LONG TERM GOAL #4   Title Patient will increase 10 meter walk test to >1.6ms with improved foot clearance and increased hip flexion in swing phase as to improve gait speed for better community ambulation and to reduce fall risk.    Baseline 10/20/19: 0.654m with rollator, minimal hip flexion and foot clearance bilat 11/8: 0.81 m/s with rollator 11/24: 0.88 m/s with rollator 12/30: 9.5 seconds >1.0 m/s    Time 8    Period Weeks    Status Achieved      PT LONG TERM GOAL #5   Title Patient will increase ABC scale score >60% to demonstrate better functional mobility and better confidence with ADLs.    Baseline 10/20/19: 36% 11/8: 43% 11/24: 44% 12/30: 46%; 02/10/2020 43.1%; 03/04/2020= 45%    Time 8    Period Weeks    Status Partially Met                 Plan - 03/16/20 1118    Clinical Impression Statement Today's session focused on reduction of shuffling gait with changes in gait speed and direction changes. With visual cuing of cones, pt displayed good ability to dislpay reciprocal gait pattern  with significant reduction in shuffling gait. Difficulty maintaining carryover after removing of visual cues. Pt displayed most difficulty with turning L/R around cones with continued shuffling gait even with verbal and visual cues. Vitals monitored throughout session with normal SPO2 and HR values. Pt lastly educated on safe t/f techniques with STS and stand-to-sit 2/2 pt's rollator brakes not working appropriately. Pt safely displayed ability to STS and stand-to-sit with PT supervision. Pt can continue to benefit from skilled PT treatment to continue to make progress towards goals and improve ambulation tolerance.    Personal Factors and Comorbidities Time since onset of injury/illness/exacerbation;Age;Comorbidity 3+;Past/Current Experience;Transportation    Comorbidities osteoporosis, anemia, hx cancer, hyperlipidemia    Examination-Activity Limitations Bathing;Bed Mobility;Dressing;Reach Overhead;Stairs;Stand;Toileting;Locomotion Level;Squat;Transfers;Bend;Hygiene/Grooming;Carry;Sit    Examination-Participation Restrictions Community Activity;Driving;Shop;Yard Work;Cleaning;Meal Prep    Stability/Clinical Decision Making Evolving/Moderate complexity    Rehab Potential Good    PT Frequency 2x / week    PT Duration 8 weeks    PT Treatment/Interventions ADLs/Self Care Home Management;Aquatic Therapy;Cryotherapy;Electrical Stimulation;DME Instruction;Gait training;Stair training;Functional mobility training;Therapeutic activities;Therapeutic exercise;Balance training;Neuromuscular re-education;Patient/family education;Manual techniques;Passive range of motion;Dry needling;Spinal Manipulations;Joint Manipulations;Orthotic Fit/Training;Energy conservation    PT Next Visit Plan Reducing shuffling gait with turns. LLE strengthening.    PT Home Exercise Plan not administered this date    Consulted and Agree with Plan of Care Patient           Patient will benefit from skilled therapeutic intervention in  order to improve the following deficits and impairments:  Abnormal gait,Decreased balance,Decreased mobility,Difficulty walking,Hypomobility,Decreased strength,Decreased knowledge of use of DME,Decreased coordination,Postural dysfunction,Impaired flexibility,Improper body mechanics,Impaired perceived functional ability,Decreased activity tolerance  Visit Diagnosis: Unsteadiness on feet  Abnormality of gait and mobility  Muscle weakness (generalized)  Neurologic  gait disorder     Problem List Patient Active Problem List   Diagnosis Date Noted  . Cognitive deficits   . Labile blood pressure   . Acute blood loss anemia   . Slow transit constipation   . Vascular headache   . 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 Hydrocephalus ([place]) 07/03/2017  . 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 Pelvic fracture ([place]) 04/18/2016  . CD (celiac disease) 08/04/2014  . Cancer of upper lobe of right lung ([place]) 08/04/2014  . Age related osteoporosis 11/26/2013  . Absolute anemia 05/21/2013  . H/O malignant neoplasm 05/21/2013  . HLD (hyperlipidemia) 05/21/2013    Salem Caster. Fairly IV, PT, DPT 03/16/2020, 11:30 AM  [place] MAIN [facility] SERVICES [address] [place], [place], [address] Phone: [phone]   Fax:  [phone]  Name: CATTLEYA DOBRATZ MRN: [id number] Date of Birth: 1945/09/01

## 2020-03-18 ENCOUNTER — Other Ambulatory Visit: Payer: Self-pay

## 2020-03-18 ENCOUNTER — Ambulatory Visit: Payer: PPO

## 2020-03-18 DIAGNOSIS — R269 Unspecified abnormalities of gait and mobility: Secondary | ICD-10-CM

## 2020-03-18 DIAGNOSIS — M6281 Muscle weakness (generalized): Secondary | ICD-10-CM

## 2020-03-18 DIAGNOSIS — R2681 Unsteadiness on feet: Secondary | ICD-10-CM

## 2020-03-18 NOTE — Therapy (Signed)
[place] MAIN [facility] SERVICES [address] [place], [place], [address] Phone: [phone]   Fax:  [phone]  Physical Therapy Treatment  Patient Details  Name: Krista Evans MRN: [id number] Date of Birth: [birth year] Referring Provider (PT): Ramonita Lab   Encounter Date: 03/18/2020   PT End of Session - 03/18/20 1213    Visit Number 33    Number of Visits 41    Date for PT Re-Evaluation 04/06/20    Authorization Type 2/10 PN 2/17    PT Start Time 1100    PT Stop Time 1140    PT Time Calculation (min) 40 min    Equipment Utilized During Treatment Gait belt    Activity Tolerance Patient tolerated treatment well    Behavior During Therapy WFL for tasks assessed/performed           Past Medical History:  Diagnosis Date  . Abnormal Q waves on electrocardiogram   . Anemia   . Aortic atherosclerosis ([place])    "I could possibly have it, my grandmother had it"  . Cancer [facility]) 05/2011   Right upper Lung CA with partial Lobectomy.  . Celiac disease   . Celiac disease   . Dyspnea    with exertion  . Hyperlipidemia   . Hyperlipidemia   . Lung mass   . Meningioma ([place])   . Osteoporosis   . Personal history of chemotherapy   . Personal history of radiation therapy     Past Surgical History:  Procedure Laterality Date  . LUNG LOBECTOMY     right lung  . VENTRICULOPERITONEAL SHUNT Right 07/03/2017   Procedure: SHUNT INSERTION VENTRICULAR-PERITONEAL;  Surgeon: Newman Pies, MD;  Location: [place];  Service: Neurosurgery;  Laterality: Right;    There were no vitals filed for this visit.   Subjective Assessment - 03/18/20 1233    Subjective The patient reports she is doing well and feels like she is getting stronger.    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 Patient has attended OP PT in the past year for LLE strengthening and balance with positive results,  but has not been in attendance recently due to COVID-19 pandemic. PMH includes osteoporosis, anemia, celiac disease, hyperlipidemia, hx of R upper lobe mass with resection 6/13, hx of pneumonia, meningioma, major atherosclerosis. VP shunt has been placed 06/2017.    Limitations Lifting;House hold activities;Standing;Walking    How long can you sit comfortably? n/a    How long can you stand comfortably? ~10 minutes (AM better than PM)    How long can you walk comfortably? a few mins until standing rest break with LLE off ground    Patient Stated Goals "to walk by myself"    Currently in Pain? No/denies    Pain Score 0-No pain          Nu step L3 x4 mins SPM >55  SP02 95% HR 91     Ambulate across stable surfaces outside. Negotiating changing direction, walking up an incline and down as well as maneuvering around obstacles without LOB.    SpO2 and HR within therapeutic range while ambulating                  PT Education - 03/18/20 1212    Education provided Yes    Education Details gait, balance    Person(s) Educated Patient    Methods Explanation    Comprehension Verbalized understanding  PT Short Term Goals - 02/11/20 8295      PT SHORT TERM GOAL #1   Title Patient will be independent in HEP demonstrating successful between session carryover and improve functional mobility and ease of completing ADLs.    Baseline 10/20/19: HEP not administered at evaluation 11/8: HEP compliant    Time 4    Period Weeks    Status Achieved    Target Date 11/17/19      PT SHORT TERM GOAL #2   Title Patient will increase ABC scale score >45% to demonstrate better functional mobility and better confidence with ADLs.    Baseline 10/20/19: 36% 11/8: 43% 11/24: 44% 12/30: 45%    Time 4    Period Weeks    Status Achieved    Target Date 01/07/20             PT Long Term Goals - 03/04/20 0849      PT LONG TERM GOAL #1   Title Patient will increase FOTO score to  equal to or greater than 49% to demonstrate statistically significant improvement in mobility and quality of life.    Baseline 10/20/19: 32% 11/24: 50.7%    Time 8    Period Weeks    Status Achieved      PT LONG TERM GOAL #2   Title Patient (> 15 years old) will complete five times sit to stand test in < 15 seconds without UE support indicating an increased LE strength and improved balance.    Baseline 10/20/19: 26.14 seconds with BUE support required 11/8: 22. 43 seconds with heavy BUE support 11/24: 17.43 seconds with heavy BUE support 12/30: 18.63 with heavy BUE support; 02/10/2020 17.36 with B UE support; 03/04/2020= 17.02 sec. with BUE support    Time 8    Period Weeks    Status On-going    Target Date 04/06/20      PT LONG TERM GOAL #3   Title Patient will demonstrate an improved Berg Balance Score of >42/56 with minimal need for BUE support as to demonstrate improved balance with ADLs such as sitting/standing and transfer balance and reduced fall risk.    Baseline 10/20/19: 28/56 with limitations due to fear of falling 11/8: 31/56 11/24: 34/56 12/30: 22/ 56; 02/10/2020 35/56; 03/04/2020=37/56    Time 8    Period Weeks    Status Partially Met    Target Date 04/06/20      PT LONG TERM GOAL #4   Title Patient will increase 10 meter walk test to >1.67ms with improved foot clearance and increased hip flexion in swing phase as to improve gait speed for better community ambulation and to reduce fall risk.    Baseline 10/20/19: 0.632m with rollator, minimal hip flexion and foot clearance bilat 11/8: 0.81 m/s with rollator 11/24: 0.88 m/s with rollator 12/30: 9.5 seconds >1.0 m/s    Time 8    Period Weeks    Status Achieved      PT LONG TERM GOAL #5   Title Patient will increase ABC scale score >60% to demonstrate better functional mobility and better confidence with ADLs.    Baseline 10/20/19: 36% 11/8: 43% 11/24: 44% 12/30: 46%; 02/10/2020 43.1%; 03/04/2020= 45%    Time 8    Period Weeks     Status Partially Met                 Plan - 03/18/20 1215    Clinical Impression Statement Patient presents with intermittent shuffling of  gait with direction change, did well with linear walking.  Patient ambulated outside this session for approx 20 mins displaying good balance and SpO2 and HR within therapeutic range.  The patient did require one seated rest break.  The patient continues to benefit from additional skilled PT services to improve gait pattern, balance and LE strength for improved quality of life.    Personal Factors and Comorbidities Time since onset of injury/illness/exacerbation;Age;Comorbidity 3+;Past/Current Experience;Transportation    Comorbidities osteoporosis, anemia, hx cancer, hyperlipidemia    Examination-Activity Limitations Bathing;Bed Mobility;Dressing;Reach Overhead;Stairs;Stand;Toileting;Locomotion Level;Squat;Transfers;Bend;Hygiene/Grooming;Carry;Sit    Examination-Participation Restrictions Community Activity;Driving;Shop;Yard Work;Cleaning;Meal Prep    Stability/Clinical Decision Making Evolving/Moderate complexity    Rehab Potential Good    PT Frequency 2x / week    PT Duration 8 weeks    PT Treatment/Interventions ADLs/Self Care Home Management;Aquatic Therapy;Cryotherapy;Electrical Stimulation;DME Instruction;Gait training;Stair training;Functional mobility training;Therapeutic activities;Therapeutic exercise;Balance training;Neuromuscular re-education;Patient/family education;Manual techniques;Passive range of motion;Dry needling;Spinal Manipulations;Joint Manipulations;Orthotic Fit/Training;Energy conservation    PT Next Visit Plan Reducing shuffling gait with turns. LLE strengthening.    PT Home Exercise Plan not administered this date    Consulted and Agree with Plan of Care Patient           Patient will benefit from skilled therapeutic intervention in order to improve the following deficits and impairments:  Abnormal gait,Decreased  balance,Decreased mobility,Difficulty walking,Hypomobility,Decreased strength,Decreased knowledge of use of DME,Decreased coordination,Postural dysfunction,Impaired flexibility,Improper body mechanics,Impaired perceived functional ability,Decreased activity tolerance  Visit Diagnosis: Unsteadiness on feet  Abnormality of gait and mobility  Muscle weakness (generalized)     Problem List Patient Active Problem List   Diagnosis Date Noted  . Cognitive deficits   . Labile blood pressure   . Acute blood loss anemia   . Slow transit constipation   . Vascular headache   . 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 Hydrocephalus ([place]) 07/03/2017  . 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 Pelvic fracture ([place]) 04/18/2016  . CD (celiac disease) 08/04/2014  . Cancer of upper lobe of right lung ([place]) 08/04/2014  . Age related osteoporosis 11/26/2013  . Absolute anemia 05/21/2013  . H/O malignant neoplasm 05/21/2013  . HLD (hyperlipidemia) 05/21/2013    Hal Morales PT, DPT 03/18/2020, 12:34 PM  [place] MAIN [facility] SERVICES [address] [place], [place], [address] Phone: [phone]   Fax:  [phone]  Name: Krista Evans MRN: [id number] Date of Birth: 1945-02-02

## 2020-03-23 ENCOUNTER — Other Ambulatory Visit: Payer: Self-pay

## 2020-03-23 ENCOUNTER — Ambulatory Visit: Payer: PPO

## 2020-03-23 DIAGNOSIS — R269 Unspecified abnormalities of gait and mobility: Secondary | ICD-10-CM

## 2020-03-23 DIAGNOSIS — M6281 Muscle weakness (generalized): Secondary | ICD-10-CM

## 2020-03-23 DIAGNOSIS — R2681 Unsteadiness on feet: Secondary | ICD-10-CM | POA: Diagnosis not present

## 2020-03-23 NOTE — Therapy (Signed)
[place] MAIN [facility] SERVICES [address] [place], [place], [address] Phone: [phone]   Fax:  [phone]  Physical Therapy Treatment  Patient Details  Name: Krista Evans MRN: [id number] Date of Birth: May 16, 1945 Referring Provider (PT): Ramonita Lab   Encounter Date: 03/23/2020   PT End of Session - 03/23/20 1105    Visit Number 34    Number of Visits 41    Date for PT Re-Evaluation 04/06/20    Authorization Type 4/10 PN 2/17    PT Start Time 1100    PT Stop Time 1142    PT Time Calculation (min) 42 min    Equipment Utilized During Treatment Gait belt    Activity Tolerance Patient tolerated treatment well    Behavior During Therapy WFL for tasks assessed/performed           Past Medical History:  Diagnosis Date  . Abnormal Q waves on electrocardiogram   . Anemia   . Aortic atherosclerosis ([place])    "I could possibly have it, my grandmother had it"  . Cancer [facility]) 05/2011   Right upper Lung CA with partial Lobectomy.  . Celiac disease   . Celiac disease   . Dyspnea    with exertion  . Hyperlipidemia   . Hyperlipidemia   . Lung mass   . Meningioma ([place])   . Osteoporosis   . Personal history of chemotherapy   . Personal history of radiation therapy     Past Surgical History:  Procedure Laterality Date  . LUNG LOBECTOMY     right lung  . VENTRICULOPERITONEAL SHUNT Right 07/03/2017   Procedure: SHUNT INSERTION VENTRICULAR-PERITONEAL;  Surgeon: Newman Pies, MD;  Location: [place];  Service: Neurosurgery;  Laterality: Right;    There were no vitals filed for this visit.   Subjective Assessment - 03/23/20 1104    Subjective Patient reports no falls or LOB since last session. Has been compliant with HEP. Got to see her son this weekend.    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 Patient has attended OP PT in the past year for LLE strengthening  and balance with positive results, but has not been in attendance recently due to COVID-19 pandemic. PMH includes osteoporosis, anemia, celiac disease, hyperlipidemia, hx of R upper lobe mass with resection 6/13, hx of pneumonia, meningioma, major atherosclerosis. VP shunt has been placed 06/2017.    Limitations Lifting;House hold activities;Standing;Walking    How long can you sit comfortably? n/a    How long can you stand comfortably? ~10 minutes (AM better than PM)    How long can you walk comfortably? a few mins until standing rest break with LLE off ground    Patient Stated Goals "to walk by myself"    Currently in Pain? No/denies                   Therex:             Nu-step L3 LE only for musculoskeletal and cardiovascular challenge 3 minutes.   ambulate across stable and unstable surface outside. Negotiating changing surfaces such as sidewalk, across brick with turns and obstacles in pathway without LOB. Gait ~1000" with rollator and CGA+1 for improving walking capacity. Noted shuffling and festinating gait with slowing of gait speed and turning. Mod verbal cuing required for improving equal step lengths to reduce shuffling with fair carryover. 2 seated rest breaks required. SPO2:  98% and HR: 89-92 BPM with each rest break.     Seated LE resisted exercise with GTB x10/each            Hamstring curls; cues for decreased velocity for optimal recruitment            Hip add            Hip abd; 3 seconds hold            Marching 10x    Heel toe raises 15x              Good form/technique with seated resisted exercise. Min verbal cuing required for targeted muscle movements with great carryover after cuing.      CGA+1 throughout all gait activities with use of mod VC and use of visual cueing to improve patient's gait mechanics. Pt still displays shuffling gait after removal of visual cues and with surface changes along with turning. Pt educated on safe STS and stand to sit  transfers due to insufficient brakes on pt's rollator. Pt displayed excellent safety with these transfers in clinic with therapist supervision.     Patient is highly motivated throughout physical therapy session. She continues to revert to shuffle pattern of ambulation when fatigued or fearful of LOB but is improving her ability to clear feet with external cueing for majority of session. Negotiation of unstable surfaces/broken pathways is improving with decreased stops and near LOB. The patient continues to benefit from additional skilled PT services to improve gait pattern, balance and LE strength for improved quality of life.                  PT Education - 03/23/20 1104    Education provided Yes    Education Details exercise technique, body mechanics    Person(s) Educated Patient    Methods Explanation;Demonstration;Tactile cues;Verbal cues    Comprehension Verbalized understanding;Returned demonstration;Verbal cues required;Tactile cues required            PT Short Term Goals - 02/11/20 2355      PT SHORT TERM GOAL #1   Title Patient will be independent in HEP demonstrating successful between session carryover and improve functional mobility and ease of completing ADLs.    Baseline 10/20/19: HEP not administered at evaluation 11/8: HEP compliant    Time 4    Period Weeks    Status Achieved    Target Date 11/17/19      PT SHORT TERM GOAL #2   Title Patient will increase ABC scale score >45% to demonstrate better functional mobility and better confidence with ADLs.    Baseline 10/20/19: 36% 11/8: 43% 11/24: 44% 12/30: 45%    Time 4    Period Weeks    Status Achieved    Target Date 01/07/20             PT Long Term Goals - 03/04/20 0849      PT LONG TERM GOAL #1   Title Patient will increase FOTO score to equal to or greater than 49% to demonstrate statistically significant improvement in mobility and quality of life.    Baseline 10/20/19: 32% 11/24: 50.7%     Time 8    Period Weeks    Status Achieved      PT LONG TERM GOAL #2   Title Patient (> 36 years old) will complete five times sit to stand test in < 15 seconds without UE support indicating an increased LE strength and improved balance.  Baseline 10/20/19: 26.14 seconds with BUE support required 11/8: 22. 43 seconds with heavy BUE support 11/24: 17.43 seconds with heavy BUE support 12/30: 18.63 with heavy BUE support; 02/10/2020 17.36 with B UE support; 03/04/2020= 17.02 sec. with BUE support    Time 8    Period Weeks    Status On-going    Target Date 04/06/20      PT LONG TERM GOAL #3   Title Patient will demonstrate an improved Berg Balance Score of >42/56 with minimal need for BUE support as to demonstrate improved balance with ADLs such as sitting/standing and transfer balance and reduced fall risk.    Baseline 10/20/19: 28/56 with limitations due to fear of falling 11/8: 31/56 11/24: 34/56 12/30: 22/ 56; 02/10/2020 35/56; 03/04/2020=37/56    Time 8    Period Weeks    Status Partially Met    Target Date 04/06/20      PT LONG TERM GOAL #4   Title Patient will increase 10 meter walk test to >1.60ms with improved foot clearance and increased hip flexion in swing phase as to improve gait speed for better community ambulation and to reduce fall risk.    Baseline 10/20/19: 0.671m with rollator, minimal hip flexion and foot clearance bilat 11/8: 0.81 m/s with rollator 11/24: 0.88 m/s with rollator 12/30: 9.5 seconds >1.0 m/s    Time 8    Period Weeks    Status Achieved      PT LONG TERM GOAL #5   Title Patient will increase ABC scale score >60% to demonstrate better functional mobility and better confidence with ADLs.    Baseline 10/20/19: 36% 11/8: 43% 11/24: 44% 12/30: 46%; 02/10/2020 43.1%; 03/04/2020= 45%    Time 8    Period Weeks    Status Partially Met                 Plan - 03/23/20 1201    Clinical Impression Statement Patient is highly motivated throughout physical therapy  session. She continues to revert to shuffle pattern of ambulation when fatigued or fearful of LOB but is improving her ability to clear feet with external cueing for majority of session. Negotiation of unstable surfaces/broken pathways is improving with decreased stops and near LOB. The patient continues to benefit from additional skilled PT services to improve gait pattern, balance and LE strength for improved quality of life.    Personal Factors and Comorbidities Time since onset of injury/illness/exacerbation;Age;Comorbidity 3+;Past/Current Experience;Transportation    Comorbidities osteoporosis, anemia, hx cancer, hyperlipidemia    Examination-Activity Limitations Bathing;Bed Mobility;Dressing;Reach Overhead;Stairs;Stand;Toileting;Locomotion Level;Squat;Transfers;Bend;Hygiene/Grooming;Carry;Sit    Examination-Participation Restrictions Community Activity;Driving;Shop;Yard Work;Cleaning;Meal Prep    Stability/Clinical Decision Making Evolving/Moderate complexity    Rehab Potential Good    PT Frequency 2x / week    PT Duration 8 weeks    PT Treatment/Interventions ADLs/Self Care Home Management;Aquatic Therapy;Cryotherapy;Electrical Stimulation;DME Instruction;Gait training;Stair training;Functional mobility training;Therapeutic activities;Therapeutic exercise;Balance training;Neuromuscular re-education;Patient/family education;Manual techniques;Passive range of motion;Dry needling;Spinal Manipulations;Joint Manipulations;Orthotic Fit/Training;Energy conservation    PT Next Visit Plan Reducing shuffling gait with turns. LLE strengthening.    PT Home Exercise Plan not administered this date    Consulted and Agree with Plan of Care Patient           Patient will benefit from skilled therapeutic intervention in order to improve the following deficits and impairments:  Abnormal gait,Decreased balance,Decreased mobility,Difficulty walking,Hypomobility,Decreased strength,Decreased knowledge of use of  DME,Decreased coordination,Postural dysfunction,Impaired flexibility,Improper body mechanics,Impaired perceived functional ability,Decreased activity tolerance  Visit Diagnosis: Unsteadiness on feet  Abnormality of gait and mobility  Muscle weakness (generalized)     Problem List Patient Active Problem List   Diagnosis Date Noted  . Cognitive deficits   . Labile blood pressure   . Acute blood loss anemia   . Slow transit constipation   . Vascular headache   . 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 Hydrocephalus ([place]) 07/03/2017  . 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 Pelvic fracture ([place]) 04/18/2016  . CD (celiac disease) 08/04/2014  . Cancer of upper lobe of right lung ([place]) 08/04/2014  . Age related osteoporosis 11/26/2013  . Absolute anemia 05/21/2013  . H/O malignant neoplasm 05/21/2013  . HLD (hyperlipidemia) 05/21/2013   Janna Arch, PT, DPT   03/23/2020, 12:03 PM  [place] MAIN [facility] SERVICES [address] [place], [place], [address] Phone: [phone]   Fax:  [phone]  Name: LANI HAVLIK MRN: [id number] Date of Birth: April 09, 1945

## 2020-03-25 ENCOUNTER — Ambulatory Visit: Payer: PPO

## 2020-03-25 ENCOUNTER — Other Ambulatory Visit: Payer: Self-pay

## 2020-03-25 DIAGNOSIS — R2681 Unsteadiness on feet: Secondary | ICD-10-CM | POA: Diagnosis not present

## 2020-03-25 DIAGNOSIS — R269 Unspecified abnormalities of gait and mobility: Secondary | ICD-10-CM

## 2020-03-25 DIAGNOSIS — M6281 Muscle weakness (generalized): Secondary | ICD-10-CM

## 2020-03-25 NOTE — Therapy (Signed)
[place] MAIN [facility] SERVICES [address] [place], [place], [address] Phone: [phone]   Fax:  [phone]  Physical Therapy Treatment  Patient Details  Name: Krista Evans MRN: [id number] Date of Birth: 04-24-1945 Referring Provider (PT): Ramonita Lab   Encounter Date: 03/25/2020   PT End of Session - 03/25/20 0840    Visit Number 35    Number of Visits 41    Date for PT Re-Evaluation 04/06/20    Authorization Type 5/10 PN 2/17    PT Start Time 0845    PT Stop Time 0929    PT Time Calculation (min) 44 min    Equipment Utilized During Treatment Gait belt    Activity Tolerance Patient tolerated treatment well    Behavior During Therapy [facility] for tasks assessed/performed           Past Medical History:  Diagnosis Date  . Abnormal Q waves on electrocardiogram   . Anemia   . Aortic atherosclerosis ([place])    "I could possibly have it, my grandmother had it"  . Cancer [facility]) 05/2011   Right upper Lung CA with partial Lobectomy.  . Celiac disease   . Celiac disease   . Dyspnea    with exertion  . Hyperlipidemia   . Hyperlipidemia   . Lung mass   . Meningioma ([place])   . Osteoporosis   . Personal history of chemotherapy   . Personal history of radiation therapy     Past Surgical History:  Procedure Laterality Date  . LUNG LOBECTOMY     right lung  . VENTRICULOPERITONEAL SHUNT Right 07/03/2017   Procedure: SHUNT INSERTION VENTRICULAR-PERITONEAL;  Surgeon: Newman Pies, MD;  Location: [place];  Service: Neurosurgery;  Laterality: Right;    There were no vitals filed for this visit.   Subjective Assessment - 03/25/20 0849    Subjective Patient reports no falls or LOB since last session. Compliant with HEP.    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 Patient has attended OP PT in the past year for LLE strengthening and balance with positive results, but  has not been in attendance recently due to COVID-19 pandemic. PMH includes osteoporosis, anemia, celiac disease, hyperlipidemia, hx of R upper lobe mass with resection 6/13, hx of pneumonia, meningioma, major atherosclerosis. VP shunt has been placed 06/2017.    Limitations Lifting;House hold activities;Standing;Walking    How long can you sit comfortably? n/a    How long can you stand comfortably? ~10 minutes (AM better than PM)    How long can you walk comfortably? a few mins until standing rest break with LLE off ground    Patient Stated Goals "to walk by myself"    Currently in Pain? No/denies                 neuro re-ed   Practice figure 8 turning around 2 cone spaced out over 20' in hallway using rollator. Noticeable difficulty raising feet along turns. Pt reports of difficulty lifting feet up after VC of performing marches. x4 figure 8's.     CGA+1 throughout all gait activities with use of mod VC and use of visual cueing to improve patient's gait mechanics. Pt still displays shuffling gait after removal of visual cues and with surface changes along with turning. Pt educated on safe STS and stand to sit transfers due to insufficient brakes on pt's rollator. Pt displayed excellent safety with  these transfers in clinic with therapist supervision.     Therex: Nustep Lvl1-34 minutes RPM>60 for cardiovascular and musculoskeletal challenge.; LE only  Ambulate >700 ft with rollator and close CGA, patient requires cueing for step length, reduction of freezing when changing rooms or approaching obstacles.Cues for negotiationof walker required for negotiation of obstacles in pathway.  Seated RTB abduction 20x  2.5lb ankle weight seated: -LAQ 10x each LE; 3 second holds, 2 sets -march 12x each LE ; 2 sets  Sit to stand 10x with UE support, cues for full sit and full stand rather than tricep pushup.      Patient's vitals monitored throughout the session. Pt educated  throughout session about proper posture and technique with exercises. Improved exercise technique, movement at target joints, use of target muscles after min to mod verbal, visual, tactile cues    Patient is challenged with shuffle steppage pattern of ambulation that improves briefly with external cues but returns to compensatory patterning with fatigue and fear of LOB. She remains highly motivated to improve her mobility but is fearful of reducing UE support. The patient continues to benefit from additional skilled PT services to improve gait pattern, balance and LE strength for improved quality of life.                   PT Education - 03/25/20 0840    Education provided Yes    Education Details exercise techinque, bdoy Programmer, systems) Educated Patient    Methods Explanation;Demonstration;Tactile cues;Verbal cues    Comprehension Verbalized understanding;Returned demonstration;Verbal cues required;Tactile cues required            PT Short Term Goals - 02/11/20 8850      PT SHORT TERM GOAL #1   Title Patient will be independent in HEP demonstrating successful between session carryover and improve functional mobility and ease of completing ADLs.    Baseline 10/20/19: HEP not administered at evaluation 11/8: HEP compliant    Time 4    Period Weeks    Status Achieved    Target Date 11/17/19      PT SHORT TERM GOAL #2   Title Patient will increase ABC scale score >45% to demonstrate better functional mobility and better confidence with ADLs.    Baseline 10/20/19: 36% 11/8: 43% 11/24: 44% 12/30: 45%    Time 4    Period Weeks    Status Achieved    Target Date 01/07/20             PT Long Term Goals - 03/04/20 0849      PT LONG TERM GOAL #1   Title Patient will increase FOTO score to equal to or greater than 49% to demonstrate statistically significant improvement in mobility and quality of life.    Baseline 10/20/19: 32% 11/24: 50.7%    Time 8    Period  Weeks    Status Achieved      PT LONG TERM GOAL #2   Title Patient (> 82 years old) will complete five times sit to stand test in < 15 seconds without UE support indicating an increased LE strength and improved balance.    Baseline 10/20/19: 26.14 seconds with BUE support required 11/8: 22. 43 seconds with heavy BUE support 11/24: 17.43 seconds with heavy BUE support 12/30: 18.63 with heavy BUE support; 02/10/2020 17.36 with B UE support; 03/04/2020= 17.02 sec. with BUE support    Time 8    Period Weeks    Status On-going  Target Date 04/06/20      PT LONG TERM GOAL #3   Title Patient will demonstrate an improved Berg Balance Score of >42/56 with minimal need for BUE support as to demonstrate improved balance with ADLs such as sitting/standing and transfer balance and reduced fall risk.    Baseline 10/20/19: 28/56 with limitations due to fear of falling 11/8: 31/56 11/24: 34/56 12/30: 22/ 56; 02/10/2020 35/56; 03/04/2020=37/56    Time 8    Period Weeks    Status Partially Met    Target Date 04/06/20      PT LONG TERM GOAL #4   Title Patient will increase 10 meter walk test to >1.51ms with improved foot clearance and increased hip flexion in swing phase as to improve gait speed for better community ambulation and to reduce fall risk.    Baseline 10/20/19: 0.669m with rollator, minimal hip flexion and foot clearance bilat 11/8: 0.81 m/s with rollator 11/24: 0.88 m/s with rollator 12/30: 9.5 seconds >1.0 m/s    Time 8    Period Weeks    Status Achieved      PT LONG TERM GOAL #5   Title Patient will increase ABC scale score >60% to demonstrate better functional mobility and better confidence with ADLs.    Baseline 10/20/19: 36% 11/8: 43% 11/24: 44% 12/30: 46%; 02/10/2020 43.1%; 03/04/2020= 45%    Time 8    Period Weeks    Status Partially Met                 Plan - 03/25/20 0910    Clinical Impression Statement Patient is challenged with shuffle steppage pattern of ambulation that  improves briefly with external cues but returns to compensatory patterning with fatigue and fear of LOB. She remains highly motivated to improve her mobility but is fearful of reducing UE support. The patient continues to benefit from additional skilled PT services to improve gait pattern, balance and LE strength for improved quality of life    Personal Factors and Comorbidities Time since onset of injury/illness/exacerbation;Age;Comorbidity 3+;Past/Current Experience;Transportation    Comorbidities osteoporosis, anemia, hx cancer, hyperlipidemia    Examination-Activity Limitations Bathing;Bed Mobility;Dressing;Reach Overhead;Stairs;Stand;Toileting;Locomotion Level;Squat;Transfers;Bend;Hygiene/Grooming;Carry;Sit    Examination-Participation Restrictions Community Activity;Driving;Shop;Yard Work;Cleaning;Meal Prep    Stability/Clinical Decision Making Evolving/Moderate complexity    Rehab Potential Good    PT Frequency 2x / week    PT Duration 8 weeks    PT Treatment/Interventions ADLs/Self Care Home Management;Aquatic Therapy;Cryotherapy;Electrical Stimulation;DME Instruction;Gait training;Stair training;Functional mobility training;Therapeutic activities;Therapeutic exercise;Balance training;Neuromuscular re-education;Patient/family education;Manual techniques;Passive range of motion;Dry needling;Spinal Manipulations;Joint Manipulations;Orthotic Fit/Training;Energy conservation    PT Next Visit Plan Reducing shuffling gait with turns. LLE strengthening.    PT Home Exercise Plan not administered this date    Consulted and Agree with Plan of Care Patient           Patient will benefit from skilled therapeutic intervention in order to improve the following deficits and impairments:  Abnormal gait,Decreased balance,Decreased mobility,Difficulty walking,Hypomobility,Decreased strength,Decreased knowledge of use of DME,Decreased coordination,Postural dysfunction,Impaired flexibility,Improper body  mechanics,Impaired perceived functional ability,Decreased activity tolerance  Visit Diagnosis: Unsteadiness on feet  Abnormality of gait and mobility  Muscle weakness (generalized)     Problem List Patient Active Problem List   Diagnosis Date Noted  . Cognitive deficits   . Labile blood pressure   . Acute blood loss anemia   . Slow transit constipation   . Vascular headache   . 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 [place]  . Communicating [place]us ([place]  07/03/2017  . Pelvic fracture ([place]) 04/18/2016  . CD (celiac disease) 08/04/2014  . Cancer of upper lobe of right lung ([place]) 08/04/2014  . Age related osteoporosis 11/26/2013  . Absolute anemia 05/21/2013  . H/O malignant neoplasm 05/21/2013  . HLD (hyperlipidemia) 05/21/2013   Janna Arch, PT, DPT   03/25/2020, 9:30 AM  [place] MAIN [facility] SERVICES [address] [place], [place], [address] Phone: [phone]   Fax:  [phone]  Name: Krista Evans MRN: [id number] Date of Birth: [birth year]

## 2020-03-30 ENCOUNTER — Ambulatory Visit: Payer: PPO

## 2020-03-30 ENCOUNTER — Other Ambulatory Visit: Payer: Self-pay

## 2020-03-30 DIAGNOSIS — R2681 Unsteadiness on feet: Secondary | ICD-10-CM | POA: Diagnosis not present

## 2020-03-30 DIAGNOSIS — M6281 Muscle weakness (generalized): Secondary | ICD-10-CM

## 2020-03-30 DIAGNOSIS — R269 Unspecified abnormalities of gait and mobility: Secondary | ICD-10-CM

## 2020-03-30 NOTE — Therapy (Signed)
[place] MAIN [facility] SERVICES [address] [place], [place], [address] Phone: [phone]   Fax:  [phone]  Physical Therapy Treatment  Patient Details  Name: Krista Evans MRN: [id number] Date of Birth: Aug 04, 1945 Referring Provider (PT): Ramonita Lab   Encounter Date: 03/30/2020   PT End of Session - 03/30/20 0853    Visit Number 36    Number of Visits 41    Date for PT Re-Evaluation 04/06/20    PT Start Time 0848    PT Stop Time 0926    PT Time Calculation (min) 38 min    Equipment Utilized During Treatment Gait belt    Activity Tolerance Patient tolerated treatment well;No increased pain    Behavior During Therapy WFL for tasks assessed/performed           Past Medical History:  Diagnosis Date  . Abnormal Q waves on electrocardiogram   . Anemia   . Aortic atherosclerosis ([place])    "I could possibly have it, my grandmother had it"  . Cancer [facility]) 05/2011   Right upper Lung CA with partial Lobectomy.  . Celiac disease   . Celiac disease   . Dyspnea    with exertion  . Hyperlipidemia   . Hyperlipidemia   . Lung mass   . Meningioma ([place])   . Osteoporosis   . Personal history of chemotherapy   . Personal history of radiation therapy     Past Surgical History:  Procedure Laterality Date  . LUNG LOBECTOMY     right lung  . VENTRICULOPERITONEAL SHUNT Right 07/03/2017   Procedure: SHUNT INSERTION VENTRICULAR-PERITONEAL;  Surgeon: Newman Pies, MD;  Location: [place];  Service: Neurosurgery;  Laterality: Right;    There were no vitals filed for this visit.   Subjective Assessment - 03/30/20 0853    Subjective Patient reports no falls or LOB since last session. Compliant with HEP. NMo additional updates. Reports  quiet weekend.    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 Patient has attended OP PT in the past year for LLE strengthening and balance  with positive results, but has not been in attendance recently due to COVID-19 pandemic. PMH includes osteoporosis, anemia, celiac disease, hyperlipidemia, hx of R upper lobe mass with resection 6/13, hx of pneumonia, meningioma, major atherosclerosis. VP shunt has been placed 06/2017.    Currently in Pain? No/denies           INTERVENTION THIS DATE: -Nustep, legs only, Seat 9. 4 minutes at level 2; requires minA for safe mount/dismount, machine setup.  *monitored closely due to SOB -Practice figure 8 turning around 2 cones    2.5lb ankle weight seated: -LAQ 10x each LE; 3 second holds, 2 sets (could easily be progress in resistance next session)  -march 12x each LE ; 2 sets -Sit to stand 10x with UE support, cues for full sit and full stand rather than tricep pushup -Seated RTB abduction 2x20  -quiet sitting yellow dynadisc trunk balance x60sec  -quiet sitting yellow dynadisc trunk balance c green ball catch (24" vertical ball drop) *encouraging reaction time, attention, and dynamic trunk control        PT Short Term Goals - 02/11/20 6962      PT SHORT TERM GOAL #1   Title Patient will be independent in HEP demonstrating successful between session carryover and improve functional mobility and ease of completing ADLs.    Baseline 10/20/19:  HEP not administered at evaluation 11/8: HEP compliant    Time 4    Period Weeks    Status Achieved    Target Date 11/17/19      PT SHORT TERM GOAL #2   Title Patient will increase ABC scale score >45% to demonstrate better functional mobility and better confidence with ADLs.    Baseline 10/20/19: 36% 11/8: 43% 11/24: 44% 12/30: 45%    Time 4    Period Weeks    Status Achieved    Target Date 01/07/20             PT Long Term Goals - 03/04/20 0849      PT LONG TERM GOAL #1   Title Patient will increase FOTO score to equal to or greater than 49% to demonstrate statistically significant improvement in mobility and quality of life.     Baseline 10/20/19: 32% 11/24: 50.7%    Time 8    Period Weeks    Status Achieved      PT LONG TERM GOAL #2   Title Patient (> 67 years old) will complete five times sit to stand test in < 15 seconds without UE support indicating an increased LE strength and improved balance.    Baseline 10/20/19: 26.14 seconds with BUE support required 11/8: 22. 43 seconds with heavy BUE support 11/24: 17.43 seconds with heavy BUE support 12/30: 18.63 with heavy BUE support; 02/10/2020 17.36 with B UE support; 03/04/2020= 17.02 sec. with BUE support    Time 8    Period Weeks    Status On-going    Target Date 04/06/20      PT LONG TERM GOAL #3   Title Patient will demonstrate an improved Berg Balance Score of >42/56 with minimal need for BUE support as to demonstrate improved balance with ADLs such as sitting/standing and transfer balance and reduced fall risk.    Baseline 10/20/19: 28/56 with limitations due to fear of falling 11/8: 31/56 11/24: 34/56 12/30: 22/ 56; 02/10/2020 35/56; 03/04/2020=37/56    Time 8    Period Weeks    Status Partially Met    Target Date 04/06/20      PT LONG TERM GOAL #4   Title Patient will increase 10 meter walk test to >1.38ms with improved foot clearance and increased hip flexion in swing phase as to improve gait speed for better community ambulation and to reduce fall risk.    Baseline 10/20/19: 0.671m with rollator, minimal hip flexion and foot clearance bilat 11/8: 0.81 m/s with rollator 11/24: 0.88 m/s with rollator 12/30: 9.5 seconds >1.0 m/s    Time 8    Period Weeks    Status Achieved      PT LONG TERM GOAL #5   Title Patient will increase ABC scale score >60% to demonstrate better functional mobility and better confidence with ADLs.    Baseline 10/20/19: 36% 11/8: 43% 11/24: 44% 12/30: 46%; 02/10/2020 43.1%; 03/04/2020= 45%    Time 8    Period Weeks    Status Partially Met                 Plan - 03/30/20 0854    Clinical Impression Statement Continued with  current plan of care as laid out in evaluation and recent prior sessions. Pt remains motivated to advance progress toward goals. Rest breaks provided as needed. Pt does require varying levels of assistance and cuing for completion of exercises for correct form and sometimes due to pain/weakness. Figure 8s  tend to cause brief dizziness, but pt does better when following cues for a wider turn made distance from the cones. Pt closely monitored throughout session to maximize patient safety during interventions. Pt continues to demonstrate progress toward goals AEB progression of some interventions this date either in volume or intensity.   Personal Factors and Comorbidities Time since onset of injury/illness/exacerbation;Age;Comorbidity 3+;Past/Current Experience;Transportation    Comorbidities osteoporosis, anemia, hx cancer, hyperlipidemia    Examination-Activity Limitations Bathing;Bed Mobility;Dressing;Reach Overhead;Stairs;Stand;Toileting;Locomotion Level;Squat;Transfers;Bend;Hygiene/Grooming;Carry;Sit    Examination-Participation Restrictions Community Activity;Driving;Shop;Yard Work;Cleaning;Meal Prep    Stability/Clinical Decision Making Evolving/Moderate complexity    Clinical Decision Making Moderate    Rehab Potential Good    PT Frequency 2x / week    PT Duration 8 weeks    PT Treatment/Interventions ADLs/Self Care Home Management;Aquatic Therapy;Cryotherapy;Electrical Stimulation;DME Instruction;Gait training;Stair training;Functional mobility training;Therapeutic activities;Therapeutic exercise;Balance training;Neuromuscular re-education;Patient/family education;Manual techniques;Passive range of motion;Dry needling;Spinal Manipulations;Joint Manipulations;Orthotic Fit/Training;Energy conservation    PT Next Visit Plan Reducing shuffling gait with turns. LLE strengthening.    PT Home Exercise Plan not administered this date    Consulted and Agree with Plan of Care Patient            Patient will benefit from skilled therapeutic intervention in order to improve the following deficits and impairments:  Abnormal gait,Decreased balance,Decreased mobility,Difficulty walking,Hypomobility,Decreased strength,Decreased knowledge of use of DME,Decreased coordination,Postural dysfunction,Impaired flexibility,Improper body mechanics,Impaired perceived functional ability,Decreased activity tolerance  Visit Diagnosis: Unsteadiness on feet  Abnormality of gait and mobility  Muscle weakness (generalized)  Neurologic gait disorder     Problem List Patient Active Problem List   Diagnosis Date Noted  . Cognitive deficits   . Labile blood pressure   . Acute blood loss anemia   . Slow transit constipation   . Vascular headache   . 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 Hydrocephalus ([place]) 07/03/2017  . 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 Pelvic fracture ([place]) 04/18/2016  . CD (celiac disease) 08/04/2014  . Cancer of upper lobe of right lung ([place]) 08/04/2014  . Age related osteoporosis 11/26/2013  . Absolute anemia 05/21/2013  . H/O malignant neoplasm 05/21/2013  . HLD (hyperlipidemia) 05/21/2013   9:17 AM, 03/30/20 Etta Grandchild, PT, DPT Physical Therapist - [place] [phone]     Etta Grandchild 03/30/2020, 8:55 AM  [place] MAIN [facility] SERVICES [address] [place], [place], [address] Phone: [phone]   Fax:  [phone]  Name: Krista Evans MRN: [id number] Date of Birth: 1945-06-04

## 2020-04-01 ENCOUNTER — Ambulatory Visit: Payer: PPO

## 2020-04-06 ENCOUNTER — Other Ambulatory Visit: Payer: Self-pay

## 2020-04-06 ENCOUNTER — Ambulatory Visit: Payer: PPO

## 2020-04-06 DIAGNOSIS — R2681 Unsteadiness on feet: Secondary | ICD-10-CM

## 2020-04-06 DIAGNOSIS — R269 Unspecified abnormalities of gait and mobility: Secondary | ICD-10-CM

## 2020-04-06 DIAGNOSIS — M6281 Muscle weakness (generalized): Secondary | ICD-10-CM

## 2020-04-06 NOTE — Progress Notes (Deleted)
   04/06/20 1005  PT SHORT TERM GOAL #1  Title Pt to demonstrate improved hip extension strength AEB ability to perform 5 full height hooklying bridges.  Baseline unable to rise from chair hands free due to hip extension weakness  Time 4  Period Weeks  Status New  Target Date 05/04/20  PT SHORT TERM GOAL #2  Title Pt to demonstrate SLS balance >3 sec hands free to facilitate weight shift in gait and single limb gross motor coordination.  Baseline Requires modA for weightshift, unable to remain in SLS without BUE  Period Weeks  Status New  Target Date 05/04/20  PT SHORT TERM GOAL #3  Title Pt to demonstrate 5xSTS hands free from single airex pad in <20sec s LOB.  Baseline unable to rise from chair without BUE  Time 4  Period Weeks  Status New  Target Date 05/04/20

## 2020-04-06 NOTE — Therapy (Signed)
[place] MAIN [facility] SERVICES [address] [place], [place], [address] Phone: [phone]   Fax:  [phone]  Physical Therapy Treatment; Recertification  Patient Details  Name: Krista Evans MRN: [id number] Date of Birth: 16-Oct-1945 Referring Provider (PT): Ramonita Lab   Encounter Date: 04/06/2020   PT End of Session - 04/06/20 0857    Visit Number 37    Number of Visits 41    Date for PT Re-Evaluation 06/29/20    Authorization Type [employer]    Authorization Time Period 02/10/20-04/06/20; 04/06/20-06/29/20    PT Start Time 0850    PT Stop Time 0930    PT Time Calculation (min) 40 min    Equipment Utilized During Treatment Gait belt    Activity Tolerance Patient tolerated treatment well;No increased pain    Behavior During Therapy WFL for tasks assessed/performed           Past Medical History:  Diagnosis Date  . Abnormal Q waves on electrocardiogram   . Anemia   . Aortic atherosclerosis ([place])    "I could possibly have it, my grandmother had it"  . Cancer [facility]) 05/2011   Right upper Lung CA with partial Lobectomy.  . Celiac disease   . Celiac disease   . Dyspnea    with exertion  . Hyperlipidemia   . Hyperlipidemia   . Lung mass   . Meningioma ([place])   . Osteoporosis   . Personal history of chemotherapy   . Personal history of radiation therapy     Past Surgical History:  Procedure Laterality Date  . LUNG LOBECTOMY     right lung  . VENTRICULOPERITONEAL SHUNT Right 07/03/2017   Procedure: SHUNT INSERTION VENTRICULAR-PERITONEAL;  Surgeon: Newman Pies, MD;  Location: [place];  Service: Neurosurgery;  Laterality: Right;    There were no vitals filed for this visit.   Subjective Assessment - 04/06/20 0854    Subjective Patient reports no falls or LOB since last session. Compliant with HEP. No additional updates. Reports  quiet weekend, denies any fun birthday plans unfortunately. Pt reports she is not done  with physical therapy yet, although she can feel the improvements she has made, she still would like to be able to walk better, still does not feel safe AMB outside alone.    Pertinent History Patient is a 75 year old female presenting with gait ataxia 2/2 normal pressure hydrocephalus. Patient has attended OPPT in the past year for LLE strengthening and balance with positive results, but has not been in attendance recently due to COVID-19 pandemic. PMH includes osteoporosis, anemia, celiac disease, hyperlipidemia, hx of R upper lobe mass with resection 6/13, hx of pneumonia, meningioma, major atherosclerosis. VP shunt has been placed 06/2017.    Currently in Pain? No/denies              [facility] PT Assessment - 04/06/20 0001      Berg Balance Test   Sit to Stand Able to stand  independently using hands    Standing Unsupported Able to stand safely 2 minutes    Sitting with Back Unsupported but Feet Supported on Floor or Stool Able to sit safely and securely 2 minutes    Stand to Sit Sits safely with minimal use of hands    Transfers Able to transfer with verbal cueing and /or supervision   festination, needs 4WW   Standing Unsupported with Eyes Closed Able to stand 10 seconds safely    Standing Unsupported with  Feet Together Needs help to attain position but able to stand for 30 seconds with feet together    From Standing, Reach Forward with Outstretched Arm Can reach forward >5 cm safely (2")    From Standing Position, Pick up Object from Floor Unable to try/needs assist to keep balance    From Standing Position, Turn to Look Behind Over each Shoulder Needs supervision when turning    Turn 360 Degrees Needs assistance while turning   festination, unable without 4WW, multiple LOB   Standing Unsupported, Alternately Place Feet on Step/Stool Needs assistance to keep from falling or unable to try   LOB each step, requires modA for weight shift stability   Standing Unsupported, One Foot in [employer] balance while stepping or standing    Standing on One Leg Unable to try or needs assist to prevent fall    Total Score 25          INTERVENTION THIS DATE: -Nustep x5 minutes, level 2, (increased to from 4 minutes, pt SOB after 5 now) Seat 9, legs only -5x STS test -Berg Balance Test  -10MWT     PT Short Term Goals - 16/10/96 0454  (old cert)      PT SHORT TERM GOAL #1   Title Patient will be independent in HEP demonstrating successful between session carryover and improve functional mobility and ease of completing ADLs.    Baseline 10/20/19: HEP not administered at evaluation 11/8: HEP compliant    Time 4    Period Weeks    Status Achieved    Target Date 11/17/19      PT SHORT TERM GOAL #2   Title Patient will increase ABC scale score >45% to demonstrate better functional mobility and better confidence with ADLs.    Baseline 10/20/19: 36% 11/8: 43% 11/24: 44% 12/30: 45%    Time 4    Period Weeks    Status Achieved    Target Date 01/07/20             04/06/20 1005  PT SHORT TERM GOAL #1 (recert goals)   Title Pt to demonstrate improved hip extension strength AEB ability to perform 5 full height hooklying bridges.  Baseline unable to rise from chair hands free due to hip extension weakness  Time 4  Period Weeks  Status New  Target Date 05/04/20  PT SHORT TERM GOAL #2  Title Pt to demonstrate SLS balance >3 sec hands free to facilitate weight shift in gait and single limb gross motor coordination.  Baseline Requires modA for weightshift, unable to remain in SLS without BUE  Period Weeks  Status New  Target Date 05/04/20  PT SHORT TERM GOAL #3  Title Pt to demonstrate 5xSTS hands free from single airex pad in <20sec s LOB.  Baseline unable to rise from chair without BUE  Time 4  Period Weeks  Status New  Target Date 05/04/20     PT Long Term Goals - 09/81/19 1478 (old cert)      PT LONG TERM GOAL #1   Title Patient will increase FOTO score to equal to or  greater than 49% to demonstrate statistically significant improvement in mobility and quality of life.    Baseline 10/20/19: 32% 11/24: 50.7%; 04/06/20    Time 8    Period Weeks    Status Achieved    Target Date 02/04/20      PT LONG TERM GOAL #2   Title Patient (> 86 years old) will  complete five times sit to stand test in < 15 seconds without UE support indicating an increased LE strength and improved balance.    Baseline 10/20/19: 26.14 seconds with BUE support required 11/8: 22. 43 seconds with heavy BUE support 11/24: 17.43 seconds with heavy BUE support 12/30: 18.63 with heavy BUE support; 02/10/2020 17.36 with B UE support; 03/04/2020= 17.02 sec. with BUE support; BUE support on chair in 12.03 sec    Time 8    Period Weeks    Status Achieved    Target Date 04/06/20      PT LONG TERM GOAL #3   Title Patient will demonstrate an improved Berg Balance Score of >42/56 with minimal need for BUE support as to demonstrate improved balance with ADLs such as sitting/standing and transfer balance and reduced fall risk.    Baseline 10/20/19: 28/56 with limitations due to fear of falling 11/8: 31/56 11/24: 34/56 12/30: 22/ 56; 02/10/2020 35/56; 03/04/2020=37/56; 04/06/20: 25/56    Time 8    Period Weeks    Target Date 04/06/20      PT LONG TERM GOAL #4   Title Patient will increase 10 meter walk test to >1.73ms with improved foot clearance and increased hip flexion in swing phase as to improve gait speed for better community ambulation and to reduce fall risk.    Baseline 10/20/19: 0.61m with rollator, minimal hip flexion and foot clearance bilat 11/8: 0.81 m/s with rollator 11/24: 0.88 m/s with rollator 12/30: 9.5 seconds >1.0 m/s;                           .             04/06/20 1009  PT LONG TERM GOAL #1  Title Patient will increase FOTO score to equal to or greater than 65% to demonstrate statistically significant improvement in mobility and quality of life.  Baseline 10/20/19: 32%  11/24: 50.7%; 04/06/20  Time 8  Period Weeks  Status New  Target Date 06/01/20  PT LONG TERM GOAL #2  Title Patient will complete five times sit to stand hands free from chair + airex in < 16 seconds.  Baseline Unable to rise from airex without use of hands  Time 8  Period Weeks  Status New  Target Date 06/01/20  PT LONG TERM GOAL #3  Title Pt to demonstrate tolerance to 6MWT c completion of 80012f 4WW, no LOB, supervision level assistance.  Baseline limited by fatigue, baseline deferred to next session.  Time 8  Period Weeks  Status New  Target Date 06/01/20  PT LONG TERM GOAL #4  Title Pt to tolerate 6MWT c 4WW, supervision level assessment >[id number] facilitate confidence and safety in limited community distance AMB.  Baseline Not performed yet.  Time 12  Period Weeks  Target Date 06/29/20  PT LONG TERM GOAL #5  Title Pt to perform 5xSTS from chair + airex hands free in <14sec to demonstrate improved motor control in transfers and bilat hip extension strength.  Baseline no baseline yet  Time 12  Period Weeks  Status New  Target Date 06/29/20         Plan - 04/06/20 0858    Clinical Impression Statement This date marks the last day of certification period, also the first day of re certification period. Pt has shown progress in her 5xSTS test, a decline in balance AEB BBT, largely mediated by gait ataxia, festination with turning and  gait initiation which makes most of the BBT unperformable due to necessity of BUE on 4WW, however static balance appears somewhat unchanged. The biggest limitation more recently has been cardiorespiratory fitness limitations, which present other unique challenges to safety in performing household mobility. Pt says she wants to be rid fo the 4WW, but Pryor Curia explained that this is not a realistic goal at this time, given her significant reliance on it to overcome ataxia-related imbalance, as well as chronicity of impairments. Author explained RW  provides benefit of improving confidence and independence in AMB, whereas pt would otherwise be more at risk for falling and likely less active. Pt remains heavily motivated to progress her independence in gait, would continue to benefit from skilled PT intervention to address focal deficits and impairment to improve independence with basic mobility to required ADL at home.    Personal Factors and Comorbidities Time since onset of injury/illness/exacerbation;Age;Comorbidity 3+;Past/Current Experience;Transportation    Comorbidities osteoporosis, anemia, hx cancer, hyperlipidemia    Examination-Activity Limitations Bathing;Bed Mobility;Dressing;Reach Overhead;Stairs;Stand;Toileting;Locomotion Level;Squat;Transfers;Bend;Hygiene/Grooming;Carry;Sit    Examination-Participation Restrictions Community Activity;Driving;Shop;Yard Work;Cleaning;Meal Prep    Stability/Clinical Decision Making Evolving/Moderate complexity    Clinical Decision Making Moderate    Rehab Potential Good    PT Frequency 2x / week    PT Duration 12 weeks    PT Treatment/Interventions ADLs/Self Care Home Management;Aquatic Therapy;Cryotherapy;Electrical Stimulation;DME Instruction;Gait training;Stair training;Functional mobility training;Therapeutic activities;Therapeutic exercise;Balance training;Neuromuscular re-education;Patient/family education;Manual techniques;Passive range of motion;Dry needling;Spinal Manipulations;Joint Manipulations;Orthotic Fit/Training;Energy conservation    PT Next Visit Plan Reducing shuffling gait with turns. LLE strengthening.    PT Home Exercise Plan not administered this date    Consulted and Agree with Plan of Care Patient           Patient will benefit from skilled therapeutic intervention in order to improve the following deficits and impairments:  Abnormal gait,Decreased balance,Decreased mobility,Difficulty walking,Hypomobility,Decreased strength,Decreased knowledge of use of DME,Decreased  coordination,Postural dysfunction,Impaired flexibility,Improper body mechanics,Impaired perceived functional ability,Decreased activity tolerance  Visit Diagnosis: Unsteadiness on feet  Abnormality of gait and mobility  Muscle weakness (generalized)  Neurologic gait disorder     Problem List Patient Active Problem List   Diagnosis Date Noted  . Cognitive deficits   . Labile blood pressure   . Acute blood loss anemia   . Slow transit constipation   . Vascular headache   . 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 Hydrocephalus ([place]) 07/03/2017  . 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 Pelvic fracture ([place]) 04/18/2016  . CD (celiac disease) 08/04/2014  . Cancer of upper lobe of right lung ([place]) 08/04/2014  . Age related osteoporosis 11/26/2013  . Absolute anemia 05/21/2013  . H/O malignant neoplasm 05/21/2013  . HLD (hyperlipidemia) 05/21/2013   10:05 AM, 04/06/20 Etta Grandchild, PT, DPT Physical Therapist - [facility]  Outpatient Physical [place] [phone]     Etta Grandchild 04/06/2020, 9:42 AM  [place] MAIN [facility] SERVICES [address] [place], [place], [address] Phone: [phone]   Fax:  [phone]  Name: DAVEIGH BATTY MRN: [id number] Date of Birth: 02/16/1945

## 2020-04-08 ENCOUNTER — Other Ambulatory Visit: Payer: Self-pay

## 2020-04-08 ENCOUNTER — Ambulatory Visit: Payer: PPO

## 2020-04-08 DIAGNOSIS — R269 Unspecified abnormalities of gait and mobility: Secondary | ICD-10-CM

## 2020-04-08 DIAGNOSIS — R2681 Unsteadiness on feet: Secondary | ICD-10-CM

## 2020-04-08 DIAGNOSIS — M6281 Muscle weakness (generalized): Secondary | ICD-10-CM

## 2020-04-08 NOTE — Therapy (Signed)
[place] MAIN [facility] SERVICES [address] [place], [place], [address] Phone: [phone]   Fax:  [phone]  Physical Therapy Treatment  Patient Details  Name: Krista Evans MRN: [id number] Date of Birth: 09/23/1945 Referring Provider (PT): Ramonita Lab   Encounter Date: 04/08/2020   PT End of Session - 04/08/20 1112    Visit Number 38    Number of Visits 65    Date for PT Re-Evaluation 06/29/20    Authorization Type [employer]    Authorization Time Period 02/10/20-04/06/20; 04/06/20-06/29/20    PT Start Time 1101    PT Stop Time 1141    PT Time Calculation (min) 40 min    Equipment Utilized During Treatment Gait belt    Activity Tolerance Patient tolerated treatment well;No increased pain    Behavior During Therapy WFL for tasks assessed/performed           Past Medical History:  Diagnosis Date  . Abnormal Q waves on electrocardiogram   . Anemia   . Aortic atherosclerosis ([place])    "I could possibly have it, my grandmother had it"  . Cancer [facility]) 05/2011   Right upper Lung CA with partial Lobectomy.  . Celiac disease   . Celiac disease   . Dyspnea    with exertion  . Hyperlipidemia   . Hyperlipidemia   . Lung mass   . Meningioma ([place])   . Osteoporosis   . Personal history of chemotherapy   . Personal history of radiation therapy     Past Surgical History:  Procedure Laterality Date  . LUNG LOBECTOMY     right lung  . VENTRICULOPERITONEAL SHUNT Right 07/03/2017   Procedure: SHUNT INSERTION VENTRICULAR-PERITONEAL;  Surgeon: Newman Pies, MD;  Location: [place];  Service: Neurosurgery;  Laterality: Right;    There were no vitals filed for this visit.   Subjective Assessment - 04/08/20 1111    Subjective Pt dling well, reports she felt strong after last PT session. She is still working on her HEP, but wants some newer exercises to work on.    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 Patient has attended OP PT in the past year for LLE strengthening and balance with positive results, but has not been in attendance recently due to COVID-19 pandemic. PMH includes osteoporosis, anemia, celiac disease, hyperlipidemia, hx of R upper lobe mass with resection 6/13, hx of pneumonia, meningioma, major atherosclerosis. VP shunt has been placed 06/2017.    Currently in Pain? No/denies           INTERVENTION THIS DATE: -overground AMB training c 4ww -1x662f (7 minutes) a few standing rest-pauses -2-3 minutes rest break  -1x6093f(7.5 minutes) a few standing rest-pauses  Seated hip extension (cable resistance), Chair + airex (Cable hole 7) -2x10 RLE @ 7.5lb -2x10 LLE @ 2.5lb  STS from chair + 2 airex pads  -multimodal cues for BUE forward reach for concentric and eccentric portions, min-modA for chair block -2x8 -second set turned 4WW around for target for fingertip contact prior to lift off and landing   PT Short Term Goals - 04/06/20 1005      PT SHORT TERM GOAL #1   Title Pt to demonstrate improved hip extension strength AEB ability to perform 5 full height hooklying bridges.    Baseline unable to rise from chair hands free due to hip extension weakness    Time 4  Period Weeks    Status New    Target Date 05/04/20      PT SHORT TERM GOAL #2   Title Pt to demonstrate SLS balance >3 sec hands free to facilitate weight shift in gait and single limb gross motor coordination.    Baseline Requires modA for weightshift, unable to remain in SLS without BUE    Period Weeks    Status New    Target Date 05/04/20      PT SHORT TERM GOAL #3   Title Pt to demonstrate 5xSTS hands free from single airex pad in <20sec s LOB.    Baseline unable to rise from chair without BUE    Time 4    Period Weeks    Status New    Target Date 05/04/20             PT Long Term Goals - 04/06/20 1009      PT LONG TERM GOAL #1   Title  Patient will increase FOTO score to equal to or greater than 65% to demonstrate statistically significant improvement in mobility and quality of life.    Baseline 10/20/19: 32% 11/24: 50.7%; 04/06/20    Time 8    Period Weeks    Status New    Target Date 06/01/20      PT LONG TERM GOAL #2   Title Patient will complete five times sit to stand hands free from chair + airex in < 16 seconds.    Baseline Unable to rise from airex without use of hands    Time 8    Period Weeks    Status New    Target Date 06/01/20      PT LONG TERM GOAL #3   Title Pt to demonstrate tolerance to 6MWT c completion of 862f c 4WW, no LOB, supervision level assistance.    Baseline limited by fatigue, baseline deferred to next session.    Time 8    Period Weeks    Status New    Target Date 06/01/20      PT LONG TERM GOAL #4   Title Pt to tolerate 6MWT c 4WW, supervision level assessment >10049fto facilitate confidence and safety in limited community distance AMB.    Baseline Not performed yet.    Time 12    Period Weeks    Target Date 06/29/20      PT LONG TERM GOAL #5   Title Pt to perform 5xSTS from chair + airex hands free in <14sec to demonstrate improved motor control in transfers and bilat hip extension strength.    Baseline no baseline yet    Time 12    Period Weeks    Status New    Target Date 06/29/20                 Plan - 04/08/20 1113    Clinical Impression Statement Visit 2 after reassessment. Updated goals more centric to AMB performance and hip strength. This session more time spent on walking, 2x 60035frest breaks as needed. Pt has clear fatigue that limits performs for rest of session. Added in cable based hip extension strength, a ways to achieve a higher degree of specificity in the patient's very range specific strength impairment. This exercise in particular reveals at 65%LLE weakness compared to RLE (in hip extension) which is more telling of difficulty rising from a chair.  Pt remains very motivated. Pt will conitnue to benefit from skilled PT intervention to address  focal deficits and impairments identified in evaluation and subsequent vistis, in order to maximize potential to advance progress toward goals of treatment, safety and independence with basic ADL performance.    Personal Factors and Comorbidities Time since onset of injury/illness/exacerbation;Age;Comorbidity 3+;Past/Current Experience;Transportation    Comorbidities osteoporosis, anemia, hx cancer, hyperlipidemia    Examination-Activity Limitations Bathing;Bed Mobility;Dressing;Reach Overhead;Stairs;Stand;Toileting;Locomotion Level;Squat;Transfers;Bend;Hygiene/Grooming;Carry;Sit    Examination-Participation Restrictions Community Activity;Driving;Shop;Yard Work;Cleaning;Meal Prep    Stability/Clinical Decision Making Evolving/Moderate complexity    Clinical Decision Making Moderate    Rehab Potential Good    PT Frequency 2x / week    PT Duration 12 weeks    PT Treatment/Interventions ADLs/Self Care Home Management;Aquatic Therapy;Cryotherapy;Electrical Stimulation;DME Instruction;Gait training;Stair training;Functional mobility training;Therapeutic activities;Therapeutic exercise;Balance training;Neuromuscular re-education;Patient/family education;Manual techniques;Passive range of motion;Dry needling;Spinal Manipulations;Joint Manipulations;Orthotic Fit/Training;Energy conservation    PT Next Visit Plan Reducing shuffling gait with turns. LLE strengthening.    PT Home Exercise Plan not administered this date           Patient will benefit from skilled therapeutic intervention in order to improve the following deficits and impairments:  Abnormal gait,Decreased balance,Decreased mobility,Difficulty walking,Hypomobility,Decreased strength,Decreased knowledge of use of DME,Decreased coordination,Postural dysfunction,Impaired flexibility,Improper body mechanics,Impaired perceived functional ability,Decreased  activity tolerance  Visit Diagnosis: Unsteadiness on feet  Abnormality of gait and mobility  Muscle weakness (generalized)  Neurologic gait disorder     Problem List Patient Active Problem List   Diagnosis Date Noted  . Cognitive deficits   . Labile blood pressure   . Acute blood loss anemia   . Slow transit constipation   . Vascular headache   . 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 Hydrocephalus ([place]) 07/03/2017  . 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 Pelvic fracture ([place]) 04/18/2016  . CD (celiac disease) 08/04/2014  . Cancer of upper lobe of right lung ([place]) 08/04/2014  . Age related osteoporosis 11/26/2013  . Absolute anemia 05/21/2013  . H/O malignant neoplasm 05/21/2013  . HLD (hyperlipidemia) 05/21/2013   11:59 AM, 04/08/20 Etta Grandchild, PT, DPT Physical Therapist - [facility]  Outpatient Physical Therapy- [place] [phone]     Etta Grandchild 04/08/2020, 11:23 AM  [place] MAIN [facility] SERVICES [address] [place], [place], [address] Phone: [phone]   Fax:  [phone]  Name: Krista Evans MRN: [id number] Date of Birth: [birth year]

## 2020-04-13 ENCOUNTER — Other Ambulatory Visit: Payer: Self-pay

## 2020-04-13 ENCOUNTER — Ambulatory Visit: Payer: PPO

## 2020-04-13 DIAGNOSIS — R269 Unspecified abnormalities of gait and mobility: Secondary | ICD-10-CM

## 2020-04-13 DIAGNOSIS — R2681 Unsteadiness on feet: Secondary | ICD-10-CM

## 2020-04-13 DIAGNOSIS — M6281 Muscle weakness (generalized): Secondary | ICD-10-CM

## 2020-04-13 NOTE — Therapy (Signed)
[place] MAIN [facility] SERVICES [address] [place], [place], [address] Phone: [phone]   Fax:  [phone]  Physical Therapy Treatment  Patient Details  Name: Krista Evans MRN: [id number] Date of Birth: June 27, 1945 Referring Provider (PT): Ramonita Lab   Encounter Date: 04/13/2020   PT End of Session - 04/13/20 1104    Visit Number 39    Number of Visits 65    Date for PT Re-Evaluation 06/29/20    Authorization Type [employer]    Authorization Time Period 02/10/20-04/06/20; 04/06/20-06/29/20    PT Start Time 1100    PT Stop Time 1146    PT Time Calculation (min) 46 min    Equipment Utilized During Treatment Gait belt    Activity Tolerance Patient tolerated treatment well;No increased pain    Behavior During Therapy WFL for tasks assessed/performed           Past Medical History:  Diagnosis Date  . Abnormal Q waves on electrocardiogram   . Anemia   . Aortic atherosclerosis ([place])    "I could possibly have it, my grandmother had it"  . Cancer [facility]) 05/2011   Right upper Lung CA with partial Lobectomy.  . Celiac disease   . Celiac disease   . Dyspnea    with exertion  . Hyperlipidemia   . Hyperlipidemia   . Lung mass   . Meningioma ([place])   . Osteoporosis   . Personal history of chemotherapy   . Personal history of radiation therapy     Past Surgical History:  Procedure Laterality Date  . LUNG LOBECTOMY     right lung  . VENTRICULOPERITONEAL SHUNT Right 07/03/2017   Procedure: SHUNT INSERTION VENTRICULAR-PERITONEAL;  Surgeon: Newman Pies, MD;  Location: [place];  Service: Neurosurgery;  Laterality: Right;    There were no vitals filed for this visit.   Subjective Assessment - 04/13/20 1103    Subjective Patient reports no falls or LOB since last session. Compliant with HEP, wants new HEP to progress with.    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 Patient has attended OP PT in the past year for LLE strengthening and balance with positive results, but has not been in attendance recently due to COVID-19 pandemic. PMH includes osteoporosis, anemia, celiac disease, hyperlipidemia, hx of R upper lobe mass with resection 6/13, hx of pneumonia, meningioma, major atherosclerosis. VP shunt has been placed 06/2017.    Currently in Pain? No/denies                neuro re-ed: close CGA and cues for safety/sequencing   Practice figure 8 turning around 2 cone spaced out over 3 ft apart  using rollator. Noticeable difficulty raising feet along turns. Pt reports of difficulty lifting feet up after VC of performing marches. x3 figure 8's. ; cues for big steps required for foot clearance.; - second set with cones placed 10 ft apart due to dizziness with first set    sit on yellow dynadisc:no UE support -pertubation's in multiple directions for trunk activation and stabilization ; multiple near LOB frequent attempt at using UE to stabilize rather than core/trunk. More challenging with L trunk activation  -cone reaching outside of BOS with opposite hand: 9 cones. Requires cues to lean and shift weight -seated reach in all planes to PT hand x 60 seconds  -patty cake reaching to PT hand for coordination, sequencing, and timing x  45 seconds   Therex: close CGA with cues for task orientation and muscle activation sequencing Nustep Lvl 2; 4 minutes RPM> 60 for cardiovascular and musculoskeletal challenge. ; LE only   2.5lb ankle weight seated:cues for back away from back of chair for core activation -LAQ 15x each LE; 3 second holds, 2 sets each LE  Sit to stand 10x with UE support, cues for full sit and full stand rather than tricep pushup. cues to scoot to edge of chair for optimal body mechanics.     standing in // bars:  -lateral stepping 4x length of // bars; cues for foot clearance; BUE support. Challenge with keeping head up and not  looking at feet.   -high knee marching 20 seconds, cues for arc of movement and head position x 2 sets ; decreasing to two fingers support   Pt educated throughout session about proper posture and technique with exercises. Improved exercise technique, movement at target joints, use of target muscles after min to mod verbal, visual, tactile cues  Patient continues to be challenged with object negotiation resulting in regression to shuffle step pattern. She continues to strengthen her LE's with decreased fatigue with repeated exercises. She requires heavy BUE support for stepping and is challenged with lifting her head from looking at her feet with mobility. The patient continues to benefit from additional skilled PT services to improve gait pattern, balance and LE strength for improved quality of life.                       PT Education - 04/13/20 1104    Education provided Yes    Education Details exercise technique, body mechanics    Person(s) Educated Patient    Methods Explanation;Demonstration;Tactile cues;Verbal cues    Comprehension Verbalized understanding;Returned demonstration;Verbal cues required;Tactile cues required            PT Short Term Goals - 04/06/20 1005      PT SHORT TERM GOAL #1   Title Pt to demonstrate improved hip extension strength AEB ability to perform 5 full height hooklying bridges.    Baseline unable to rise from chair hands free due to hip extension weakness    Time 4    Period Weeks    Status New    Target Date 05/04/20      PT SHORT TERM GOAL #2   Title Pt to demonstrate SLS balance >3 sec hands free to facilitate weight shift in gait and single limb gross motor coordination.    Baseline Requires modA for weightshift, unable to remain in SLS without BUE    Period Weeks    Status New    Target Date 05/04/20      PT SHORT TERM GOAL #3   Title Pt to demonstrate 5xSTS hands free from single airex pad in <20sec s LOB.    Baseline  unable to rise from chair without BUE    Time 4    Period Weeks    Status New    Target Date 05/04/20             PT Long Term Goals - 04/06/20 1009      PT LONG TERM GOAL #1   Title Patient will increase FOTO score to equal to or greater than 65% to demonstrate statistically significant improvement in mobility and quality of life.    Baseline 10/20/19: 32% 11/24: 50.7%; 04/06/20    Time 8    Period Weeks  Status New    Target Date 06/01/20      PT LONG TERM GOAL #2   Title Patient will complete five times sit to stand hands free from chair + airex in < 16 seconds.    Baseline Unable to rise from airex without use of hands    Time 8    Period Weeks    Status New    Target Date 06/01/20      PT LONG TERM GOAL #3   Title Pt to demonstrate tolerance to 6MWT c completion of 85f c 4WW, no LOB, supervision level assistance.    Baseline limited by fatigue, baseline deferred to next session.    Time 8    Period Weeks    Status New    Target Date 06/01/20      PT LONG TERM GOAL #4   Title Pt to tolerate 6MWT c 4WW, supervision level assessment >10026fto facilitate confidence and safety in limited community distance AMB.    Baseline Not performed yet.    Time 12    Period Weeks    Target Date 06/29/20      PT LONG TERM GOAL #5   Title Pt to perform 5xSTS from chair + airex hands free in <14sec to demonstrate improved motor control in transfers and bilat hip extension strength.    Baseline no baseline yet    Time 12    Period Weeks    Status New    Target Date 06/29/20                 Plan - 04/13/20 1132    Clinical Impression Statement Patient continues to be challenged with object negotiation resulting in regression to shuffle step pattern. She continues to strengthen her LE's with decreased fatigue with repeated exercises. She requires heavy BUE support for stepping and is challenged with lifting her head from looking at her feet with mobility. The patient  continues to benefit from additional skilled PT services to improve gait pattern, balance and LE strength for improved quality of life.    Personal Factors and Comorbidities Time since onset of injury/illness/exacerbation;Age;Comorbidity 3+;Past/Current Experience;Transportation    Comorbidities osteoporosis, anemia, hx cancer, hyperlipidemia    Examination-Activity Limitations Bathing;Bed Mobility;Dressing;Reach Overhead;Stairs;Stand;Toileting;Locomotion Level;Squat;Transfers;Bend;Hygiene/Grooming;Carry;Sit    Examination-Participation Restrictions Community Activity;Driving;Shop;Yard Work;Cleaning;Meal Prep    Stability/Clinical Decision Making Evolving/Moderate complexity    Rehab Potential Good    PT Frequency 2x / week    PT Duration 8 weeks    PT Treatment/Interventions ADLs/Self Care Home Management;Aquatic Therapy;Cryotherapy;Electrical Stimulation;DME Instruction;Gait training;Stair training;Functional mobility training;Therapeutic activities;Therapeutic exercise;Balance training;Neuromuscular re-education;Patient/family education;Manual techniques;Passive range of motion;Dry needling;Spinal Manipulations;Joint Manipulations;Orthotic Fit/Training;Energy conservation    PT Next Visit Plan Reducing shuffling gait with turns. LLE strengthening.    PT Home Exercise Plan not administered this date    Consulted and Agree with Plan of Care Patient           Patient will benefit from skilled therapeutic intervention in order to improve the following deficits and impairments:  Abnormal gait,Decreased balance,Decreased mobility,Difficulty walking,Hypomobility,Decreased strength,Decreased knowledge of use of DME,Decreased coordination,Postural dysfunction,Impaired flexibility,Improper body mechanics,Impaired perceived functional ability,Decreased activity tolerance  Visit Diagnosis: Unsteadiness on feet  Abnormality of gait and mobility  Muscle weakness (generalized)     Problem  List Patient Active Problem List   Diagnosis Date Noted  . Cognitive deficits   . Labile blood pressure   . Acute blood loss anemia   . Slow transit constipation   . Vascular headache   .  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 Hydrocephalus ([place]) 07/03/2017  . 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 Pelvic fracture ([place]) 04/18/2016  . CD (celiac disease) 08/04/2014  . Cancer of upper lobe of right lung ([place]) 08/04/2014  . Age related osteoporosis 11/26/2013  . Absolute anemia 05/21/2013  . H/O malignant neoplasm 05/21/2013  . HLD (hyperlipidemia) 05/21/2013   Janna Arch, PT, DPT   04/13/2020, 11:50 AM  [place] MAIN [facility] SERVICES [address] [place], [place], [address] Phone: [phone]   Fax:  [phone]  Name: Krista Evans MRN: [id number] Date of Birth: 09/21/1945

## 2020-04-15 ENCOUNTER — Ambulatory Visit: Payer: PPO

## 2020-04-19 ENCOUNTER — Ambulatory Visit: Payer: PPO | Attending: Internal Medicine

## 2020-04-19 ENCOUNTER — Ambulatory Visit: Payer: PPO

## 2020-04-19 DIAGNOSIS — M6281 Muscle weakness (generalized): Secondary | ICD-10-CM | POA: Insufficient documentation

## 2020-04-19 DIAGNOSIS — R2681 Unsteadiness on feet: Secondary | ICD-10-CM | POA: Insufficient documentation

## 2020-04-19 DIAGNOSIS — R269 Unspecified abnormalities of gait and mobility: Secondary | ICD-10-CM | POA: Diagnosis not present

## 2020-04-19 NOTE — Therapy (Signed)
[place] MAIN [facility] SERVICES [address] [place], [place], [address] Phone: [phone]   Fax:  [phone]  Physical Therapy Treatment Physical Therapy Progress Note   Dates of reporting period  03/04/20   to   04/19/20   Patient Details  Name: Krista Evans MRN: [id number] Date of Birth: 1945/11/21 Referring Provider (PT): Ramonita Lab   Encounter Date: 04/19/2020   PT End of Session - 04/19/20 1239    Visit Number 40    Number of Visits 65    Date for PT Re-Evaluation 06/29/20    Authorization Type [employer]    Authorization Time Period 02/10/20-04/06/20; 04/06/20-06/29/20    PT Start Time 1030    PT Stop Time 1114    PT Time Calculation (min) 44 min    Equipment Utilized During Treatment Gait belt    Activity Tolerance Patient tolerated treatment well;No increased pain    Behavior During Therapy WFL for tasks assessed/performed           Past Medical History:  Diagnosis Date  . Abnormal Q waves on electrocardiogram   . Anemia   . Aortic atherosclerosis ([place])    "I could possibly have it, my grandmother had it"  . Cancer [facility]) 05/2011   Right upper Lung CA with partial Lobectomy.  . Celiac disease   . Celiac disease   . Dyspnea    with exertion  . Hyperlipidemia   . Hyperlipidemia   . Lung mass   . Meningioma ([place])   . Osteoporosis   . Personal history of chemotherapy   . Personal history of radiation therapy     Past Surgical History:  Procedure Laterality Date  . LUNG LOBECTOMY     right lung  . VENTRICULOPERITONEAL SHUNT Right 07/03/2017   Procedure: SHUNT INSERTION VENTRICULAR-PERITONEAL;  Surgeon: Newman Pies, MD;  Location: [place];  Service: Neurosurgery;  Laterality: Right;    There were no vitals filed for this visit.   Subjective Assessment - 04/19/20 1036    Subjective Patient reports no falls or LOB since last session. Has been compliant with HEP.    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 Patient has attended OP PT in the past year for LLE strengthening and balance with positive results, but has not been in attendance recently due to COVID-19 pandemic. PMH includes osteoporosis, anemia, celiac disease, hyperlipidemia, hx of R upper lobe mass with resection 6/13, hx of pneumonia, meningioma, major atherosclerosis. VP shunt has been placed 06/2017.    Currently in Pain? No/denies              Goals SLS unable to perform  5x STS seated on airex pad hands free: 31.75 no UE support  FOTO : 45%  6 MWT with 4 WW: 673 ft .  5x STS : 16.3 seconds with BUE support    Treatment:  Sit to stand reaching for rollator 4x no UE support from chair. Requires UE support to sit back down    Negotiating between two cones for carryover to turning in natural environment ; very challenging with cues for big steps x 6 trials.    10 kicks LLE to soccer ball (multiple unsuccessful attempts) for coordination spatial awareness, and sequencing.      Patient's condition has the potential to improve in response to therapy. Maximum improvement is yet to be obtained. The anticipated improvement is attainable and reasonable in  a generally predictable time.  Patient reports she's getting better but not where she wants to be yet.     Patient is progressing towards goals, able to sit to stand from elevated chair for first time. She is progressing towards increased capacity of ambulation with improved distance performed during 6 minute walk test. Her FOTO did decrease however patient was confused with questions.  Patient's condition has the potential to improve in response to therapy. Maximum improvement is yet to be obtained. The anticipated improvement is attainable and reasonable in a generally predictable time. The patient continues to benefit from additional skilled PT services to improve gait pattern, balance and LE strength  for improved quality of life     PT Education - 04/19/20 1107    Education provided Yes    Education Details goals, POC, exercise technique.    Person(s) Educated Patient    Methods Explanation;Demonstration;Tactile cues;Verbal cues    Comprehension Verbalized understanding;Returned demonstration;Verbal cues required;Tactile cues required            PT Short Term Goals - 04/19/20 1039      PT SHORT TERM GOAL #1   Title Pt to demonstrate improved hip extension strength AEB ability to perform 5 full height hooklying bridges.    Baseline unable to rise from chair hands free due to hip extension weakness    Time 4    Period Weeks    Status Partially Met    Target Date 05/04/20      PT SHORT TERM GOAL #2   Title Pt to demonstrate SLS balance >3 sec hands free to facilitate weight shift in gait and single limb gross motor coordination.    Baseline Requires modA for weightshift, unable to remain in SLS without BUE 4/4: unable to perform    Period Weeks    Status On-going    Target Date 05/04/20      PT SHORT TERM GOAL #3   Title Pt to demonstrate 5xSTS hands free from single airex pad in <20sec s LOB.    Baseline unable to rise from chair without BUE 4/4: 31.75 ft    Time 4    Period Weeks    Status Partially Met    Target Date 05/04/20             PT Long Term Goals - 04/19/20 1039      PT LONG TERM GOAL #1   Title Patient will increase FOTO score to equal to or greater than 65% to demonstrate statistically significant improvement in mobility and quality of life.    Baseline 10/20/19: 32% 11/24: 50.7%; 04/06/20    Time 8    Period Weeks    Status New    Target Date 06/01/20      PT LONG TERM GOAL #2   Title Patient will complete five times sit to stand hands free from chair + airex in < 16 seconds.    Baseline Unable to rise from airex without use of hands 4/4: 31.75 seconds with no UE support    Time 8    Period Weeks    Status Partially Met    Target Date  06/01/20      PT LONG TERM GOAL #3   Title Pt to demonstrate tolerance to 6MWT c completion of 8107f c 4WW, no LOB, supervision level assistance.    Baseline limited by fatigue, baseline deferred to next session. 4/4: 673 ft with rollator    Time 8    Period  Weeks    Status Partially Met    Target Date 06/01/20      PT LONG TERM GOAL #4   Title Pt to tolerate 6MWT c 4WW, supervision level assessment >1097f to facilitate confidence and safety in limited community distance AMB.    Baseline Not performed yet. 4/4: 673 ft with rollator    Time 12    Period Weeks    Status Partially Met    Target Date 06/29/20      PT LONG TERM GOAL #5   Title Pt to perform 5xSTS from chair + airex hands free in <14sec to demonstrate improved motor control in transfers and bilat hip extension strength.    Baseline no baseline yet 4/4: 31.75 seconds with no UE support    Time 12    Period Weeks    Status Partially Met                 Plan - 04/19/20 1111    Clinical Impression Statement Patient is progressing towrads goals, able to sit to stand from elevated chair for first time. She is progressing towards increased capacity of ambulation with improved distance performed during 6 minute walk test. Her FOTO did decrease however patient was confused with questions.  Patient's condition has the potential to improve in response to therapy. Maximum improvement is yet to be obtained. The anticipated improvement is attainable and reasonable in a generally predictable time. The patient continues to benefit from additional skilled PT services to improve gait pattern, balance and LE strength for improved quality of life    Personal Factors and Comorbidities Time since onset of injury/illness/exacerbation;Age;Comorbidity 3+;Past/Current Experience;Transportation    Comorbidities osteoporosis, anemia, hx cancer, hyperlipidemia    Examination-Activity Limitations Bathing;Bed Mobility;Dressing;Reach  Overhead;Stairs;Stand;Toileting;Locomotion Level;Squat;Transfers;Bend;Hygiene/Grooming;Carry;Sit    Examination-Participation Restrictions Community Activity;Driving;Shop;Yard Work;Cleaning;Meal Prep    Stability/Clinical Decision Making Evolving/Moderate complexity    Rehab Potential Good    PT Frequency 2x / week    PT Duration 8 weeks    PT Treatment/Interventions ADLs/Self Care Home Management;Aquatic Therapy;Cryotherapy;Electrical Stimulation;DME Instruction;Gait training;Stair training;Functional mobility training;Therapeutic activities;Therapeutic exercise;Balance training;Neuromuscular re-education;Patient/family education;Manual techniques;Passive range of motion;Dry needling;Spinal Manipulations;Joint Manipulations;Orthotic Fit/Training;Energy conservation    PT Next Visit Plan Reducing shuffling gait with turns. LLE strengthening.    PT Home Exercise Plan not administered this date    Consulted and Agree with Plan of Care Patient           Patient will benefit from skilled therapeutic intervention in order to improve the following deficits and impairments:  Abnormal gait,Decreased balance,Decreased mobility,Difficulty walking,Hypomobility,Decreased strength,Decreased knowledge of use of DME,Decreased coordination,Postural dysfunction,Impaired flexibility,Improper body mechanics,Impaired perceived functional ability,Decreased activity tolerance  Visit Diagnosis: Unsteadiness on feet  Abnormality of gait and mobility  Muscle weakness (generalized)     Problem List Patient Active Problem List   Diagnosis Date Noted  . Cognitive deficits   . Labile blood pressure   . Acute blood loss anemia   . Slow transit constipation   . Vascular headache   . 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 [place] ([place] 07/03/2017  . Communicating [place] ([place] 07/03/2017  . [place] ([place] 04/18/2016  . CD (celiac disease) 08/04/2014  . Cancer of [place] lobe of right lung  [place] 08/04/2014  . Age related osteoporosis 11/26/2013  . Absolute anemia 05/21/2013  . H/O malignant neoplasm 05/21/2013  . HLD (hyperlipidemia) 05/21/2013   MJanna Arch PT, DPT   04/19/2020, 12:41 PM  [place] [facility] [phone]  [place], [place], [address] Phone: [phone]   Fax:  [phone]  Name: Krista Evans MRN: [id number] Date of Birth: 09/29/1945

## 2020-04-21 ENCOUNTER — Other Ambulatory Visit: Payer: Self-pay

## 2020-04-21 ENCOUNTER — Ambulatory Visit: Payer: PPO

## 2020-04-21 DIAGNOSIS — R2681 Unsteadiness on feet: Secondary | ICD-10-CM

## 2020-04-21 DIAGNOSIS — M6281 Muscle weakness (generalized): Secondary | ICD-10-CM

## 2020-04-21 DIAGNOSIS — R269 Unspecified abnormalities of gait and mobility: Secondary | ICD-10-CM

## 2020-04-21 NOTE — Therapy (Signed)
[place] MAIN [facility] SERVICES [address] [place], [place], [address] Phone: [phone]   Fax:  [phone]  Physical Therapy Treatment  Patient Details  Name: Krista Evans MRN: [id number] Date of Birth: [birth year] Referring Provider (PT): Ramonita Lab   Encounter Date: 04/21/2020   PT End of Session - 04/21/20 1034    Visit Number 41    Number of Visits 65    Date for PT Re-Evaluation 06/29/20    Authorization Type [employer]    Authorization Time Period 02/10/20-04/06/20; 04/06/20-06/29/20    PT Start Time 1030    PT Stop Time 1114    PT Time Calculation (min) 44 min    Equipment Utilized During Treatment Gait belt    Activity Tolerance Patient tolerated treatment well;No increased pain    Behavior During Therapy WFL for tasks assessed/performed           Past Medical History:  Diagnosis Date  . Abnormal Q waves on electrocardiogram   . Anemia   . Aortic atherosclerosis ([place])    "I could possibly have it, my grandmother had it"  . Cancer [facility]) 05/2011   Right upper Lung CA with partial Lobectomy.  . Celiac disease   . Celiac disease   . Dyspnea    with exertion  . Hyperlipidemia   . Hyperlipidemia   . Lung mass   . Meningioma ([place])   . Osteoporosis   . Personal history of chemotherapy   . Personal history of radiation therapy     Past Surgical History:  Procedure Laterality Date  . LUNG LOBECTOMY     right lung  . VENTRICULOPERITONEAL SHUNT Right 07/03/2017   Procedure: SHUNT INSERTION VENTRICULAR-PERITONEAL;  Surgeon: Newman Pies, MD;  Location: [place];  Service: Neurosurgery;  Laterality: Right;    There were no vitals filed for this visit.   Subjective Assessment - 04/21/20 1033    Subjective Patient reports compliance with HEP. No falls or LOB since last session.    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 Patient has  attended OP PT in the past year for LLE strengthening and balance with positive results, but has not been in attendance recently due to COVID-19 pandemic. PMH includes osteoporosis, anemia, celiac disease, hyperlipidemia, hx of R upper lobe mass with resection 6/13, hx of pneumonia, meningioma, major atherosclerosis. VP shunt has been placed 06/2017.    Currently in Pain? No/denies                neuro re-ed By support bar: step over two consecutive half foam rollars, unable to only place single limb between them, required full stop before each step over ; BUE support 10x Lateral step over and back half foam roller 8x each direction airex CVE:LFYBOF standspatial awareness no UE support 60 seconds x 2 trials  Therex: Nustep Lvl1-34 minutes RPM>60 for cardiovascular and musculoskeletal challenge.; LE only   2.5lb ankle weight standing: -marches with heavy BUE support 12x each LE -hip extension 15x each LE, BUE support -lateral stepping 4x length of // bars BUE support  2.5lb ankle weight seated: -LAQ 10x each LE -march 12x each LE  -heel toe raise 10x each LE  Seated RTB hamstring curl 10x each LE     Patient's vitals monitored throughout the session. Pt educated throughout session about proper posture and technique with exercises. Improved exercise technique, movement at target joints, use of target muscles  after min to mod verbal, visual, tactile cues                     PT Education - 04/21/20 1034    Education provided Yes    Education Details exercise technique, body mechanics    Person(s) Educated Patient    Methods Explanation;Demonstration;Tactile cues;Verbal cues    Comprehension Verbalized understanding;Returned demonstration;Verbal cues required;Tactile cues required            PT Short Term Goals - 04/19/20 1039      PT SHORT TERM GOAL #1   Title Pt to demonstrate improved hip extension strength AEB ability to perform 5 full  height hooklying bridges.    Baseline unable to rise from chair hands free due to hip extension weakness    Time 4    Period Weeks    Status Partially Met    Target Date 05/04/20      PT SHORT TERM GOAL #2   Title Pt to demonstrate SLS balance >3 sec hands free to facilitate weight shift in gait and single limb gross motor coordination.    Baseline Requires modA for weightshift, unable to remain in SLS without BUE 4/4: unable to perform    Period Weeks    Status On-going    Target Date 05/04/20      PT SHORT TERM GOAL #3   Title Pt to demonstrate 5xSTS hands free from single airex pad in <20sec s LOB.    Baseline unable to rise from chair without BUE 4/4: 31.75 ft    Time 4    Period Weeks    Status Partially Met    Target Date 05/04/20             PT Long Term Goals - 04/19/20 1039      PT LONG TERM GOAL #1   Title Patient will increase FOTO score to equal to or greater than 65% to demonstrate statistically significant improvement in mobility and quality of life.    Baseline 10/20/19: 32% 11/24: 50.7%; 04/06/20    Time 8    Period Weeks    Status New    Target Date 06/01/20      PT LONG TERM GOAL #2   Title Patient will complete five times sit to stand hands free from chair + airex in < 16 seconds.    Baseline Unable to rise from airex without use of hands 4/4: 31.75 seconds with no UE support    Time 8    Period Weeks    Status Partially Met    Target Date 06/01/20      PT LONG TERM GOAL #3   Title Pt to demonstrate tolerance to 6MWT c completion of 873f c 4WW, no LOB, supervision level assistance.    Baseline limited by fatigue, baseline deferred to next session. 4/4: 673 ft with rollator    Time 8    Period Weeks    Status Partially Met    Target Date 06/01/20      PT LONG TERM GOAL #4   Title Pt to tolerate 6MWT c 4WW, supervision level assessment >10066fto facilitate confidence and safety in limited community distance AMB.    Baseline Not performed yet.  4/4: 673 ft with rollator    Time 12    Period Weeks    Status Partially Met    Target Date 06/29/20      PT LONG TERM GOAL #5   Title Pt to  perform 5xSTS from chair + airex hands free in <14sec to demonstrate improved motor control in transfers and bilat hip extension strength.    Baseline no baseline yet 4/4: 31.75 seconds with no UE support    Time 12    Period Weeks    Status Partially Met                 Plan - 04/21/20 1058    Clinical Impression Statement Patient's session focused son object negotiation and strengthening with patient tolerating interventions well. She remains highly motivated despite fear of LOB resulting in limited ability to let go of UE support as well as shuffle stepping near obstacles. The patient continues to benefit from additional skilled PT services to improve gait pattern, balance and LE strength for improved quality of life    Personal Factors and Comorbidities Time since onset of injury/illness/exacerbation;Age;Comorbidity 3+;Past/Current Experience;Transportation    Comorbidities osteoporosis, anemia, hx cancer, hyperlipidemia    Examination-Activity Limitations Bathing;Bed Mobility;Dressing;Reach Overhead;Stairs;Stand;Toileting;Locomotion Level;Squat;Transfers;Bend;Hygiene/Grooming;Carry;Sit    Examination-Participation Restrictions Community Activity;Driving;Shop;Yard Work;Cleaning;Meal Prep    Stability/Clinical Decision Making Evolving/Moderate complexity    Rehab Potential Good    PT Frequency 2x / week    PT Duration 8 weeks    PT Treatment/Interventions ADLs/Self Care Home Management;Aquatic Therapy;Cryotherapy;Electrical Stimulation;DME Instruction;Gait training;Stair training;Functional mobility training;Therapeutic activities;Therapeutic exercise;Balance training;Neuromuscular re-education;Patient/family education;Manual techniques;Passive range of motion;Dry needling;Spinal Manipulations;Joint Manipulations;Orthotic Fit/Training;Energy  conservation    PT Next Visit Plan Reducing shuffling gait with turns. LLE strengthening.    PT Home Exercise Plan not administered this date    Consulted and Agree with Plan of Care Patient           Patient will benefit from skilled therapeutic intervention in order to improve the following deficits and impairments:  Abnormal gait,Decreased balance,Decreased mobility,Difficulty walking,Hypomobility,Decreased strength,Decreased knowledge of use of DME,Decreased coordination,Postural dysfunction,Impaired flexibility,Improper body mechanics,Impaired perceived functional ability,Decreased activity tolerance  Visit Diagnosis: Unsteadiness on feet  Abnormality of gait and mobility  Muscle weakness (generalized)     Problem List Patient Active Problem List   Diagnosis Date Noted  . Cognitive deficits   . Labile blood pressure   . Acute blood loss anemia   . Slow transit constipation   . Vascular headache   . 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 Hydrocephalus ([place]) 07/03/2017  . 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 Pelvic fracture ([place]) 04/18/2016  . CD (celiac disease) 08/04/2014  . Cancer of upper lobe of right lung ([place]) 08/04/2014  . Age related osteoporosis 11/26/2013  . Absolute anemia 05/21/2013  . H/O malignant neoplasm 05/21/2013  . HLD (hyperlipidemia) 05/21/2013   Janna Arch, PT, DPT   04/21/2020, 11:14 AM  [place] MAIN [facility] SERVICES [address] [place], [place], [address] Phone: [phone]   Fax:  [phone]  Name: SHAKEELA RABADAN MRN: [id number] Date of Birth: 06/04/1945

## 2020-04-26 ENCOUNTER — Ambulatory Visit: Payer: PPO

## 2020-04-26 ENCOUNTER — Other Ambulatory Visit: Payer: Self-pay

## 2020-04-26 DIAGNOSIS — M6281 Muscle weakness (generalized): Secondary | ICD-10-CM

## 2020-04-26 DIAGNOSIS — R2681 Unsteadiness on feet: Secondary | ICD-10-CM | POA: Diagnosis not present

## 2020-04-26 DIAGNOSIS — R269 Unspecified abnormalities of gait and mobility: Secondary | ICD-10-CM

## 2020-04-26 NOTE — Therapy (Signed)
[place] MAIN [facility] SERVICES [address] [place], [place], [address] Phone: [phone]   Fax:  [phone]  Physical Therapy Treatment  Patient Details  Name: Krista Evans MRN: [id number] Date of Birth: 11/05/1945 Referring Provider (PT): Ramonita Lab   Encounter Date: 04/26/2020   PT End of Session - 04/26/20 1049    Visit Number 42    Number of Visits 65    Date for PT Re-Evaluation 06/29/20    Authorization Type [employer]    Authorization Time Period 02/10/20-04/06/20; 04/06/20-06/29/20    PT Start Time 1028    PT Stop Time 1112    PT Time Calculation (min) 44 min    Equipment Utilized During Treatment Gait belt    Activity Tolerance Patient tolerated treatment well;No increased pain    Behavior During Therapy WFL for tasks assessed/performed           Past Medical History:  Diagnosis Date  . Abnormal Q waves on electrocardiogram   . Anemia   . Aortic atherosclerosis ([place])    "I could possibly have it, my grandmother had it"  . Cancer [facility]) 05/2011   Right upper Lung CA with partial Lobectomy.  . Celiac disease   . Celiac disease   . Dyspnea    with exertion  . Hyperlipidemia   . Hyperlipidemia   . Lung mass   . Meningioma ([place])   . Osteoporosis   . Personal history of chemotherapy   . Personal history of radiation therapy     Past Surgical History:  Procedure Laterality Date  . LUNG LOBECTOMY     right lung  . VENTRICULOPERITONEAL SHUNT Right 07/03/2017   Procedure: SHUNT INSERTION VENTRICULAR-PERITONEAL;  Surgeon: Newman Pies, MD;  Location: [place];  Service: Neurosurgery;  Laterality: Right;    There were no vitals filed for this visit.   Subjective Assessment - 04/26/20 1049    Subjective Patient reports no falls or LOB since last session. Reports compliance with HEP.    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 Patient  has attended OP PT in the past year for LLE strengthening and balance with positive results, but has not been in attendance recently due to COVID-19 pandemic. PMH includes osteoporosis, anemia, celiac disease, hyperlipidemia, hx of R upper lobe mass with resection 6/13, hx of pneumonia, meningioma, major atherosclerosis. VP shunt has been placed 06/2017.    Currently in Pain? No/denies                   neuro re-ed By support bar: Speed ladder: one foot each square with focus on foot alignment and larger step length. Very very challenging for patient with high encouragement required. Unable to reduce to SUE successfully, requires use of ball in her left hand to be able to walk with SUE support.   Therex:    ambulate across stable and unstable surface outside. Negotiating changing surfaces from cement to sidewalk, across brick with turns, inclines and declines, and obstacles in pathway without LOB. Multiple seated rest breaks. Close CGA  With use of rollator x 18 minutes     2.5lb ankle weight standing: -step up and over 4" step with max cueing for sequencing, occasional Min A for stabilization and high level of encouragement required. X 4 trials    2.5lb ankle weight seated: -LAQ 10x each LE x3 second holds each LE  -march 12x each LE  -  heel toe raise 10x each LE     Patient's vitals monitored throughout the session.  Pt educated throughout session about proper posture and technique with exercises. Improved exercise technique, movement at target joints, use of target muscles after min to mod verbal, visual, tactile cues    Patient continues to be highly motivated throughout physical therapy session. She continues to be limited by fatigue and shortness of breathe for prolonged muscle recruitment and mobility requiring occasional rest breaks. The patient continues to benefit from additional skilled PT services to improve gait pattern, balance and LE strength for improved quality of  life                 PT Education - 04/26/20 1049    Education provided Yes    Education Details exercise technique, body mechanics    Person(s) Educated Patient    Methods Explanation;Demonstration;Tactile cues;Verbal cues    Comprehension Verbalized understanding;Returned demonstration;Verbal cues required;Tactile cues required            PT Short Term Goals - 04/19/20 1039      PT SHORT TERM GOAL #1   Title Pt to demonstrate improved hip extension strength AEB ability to perform 5 full height hooklying bridges.    Baseline unable to rise from chair hands free due to hip extension weakness    Time 4    Period Weeks    Status Partially Met    Target Date 05/04/20      PT SHORT TERM GOAL #2   Title Pt to demonstrate SLS balance >3 sec hands free to facilitate weight shift in gait and single limb gross motor coordination.    Baseline Requires modA for weightshift, unable to remain in SLS without BUE 4/4: unable to perform    Period Weeks    Status On-going    Target Date 05/04/20      PT SHORT TERM GOAL #3   Title Pt to demonstrate 5xSTS hands free from single airex pad in <20sec s LOB.    Baseline unable to rise from chair without BUE 4/4: 31.75 ft    Time 4    Period Weeks    Status Partially Met    Target Date 05/04/20             PT Long Term Goals - 04/19/20 1039      PT LONG TERM GOAL #1   Title Patient will increase FOTO score to equal to or greater than 65% to demonstrate statistically significant improvement in mobility and quality of life.    Baseline 10/20/19: 32% 11/24: 50.7%; 04/06/20    Time 8    Period Weeks    Status New    Target Date 06/01/20      PT LONG TERM GOAL #2   Title Patient will complete five times sit to stand hands free from chair + airex in < 16 seconds.    Baseline Unable to rise from airex without use of hands 4/4: 31.75 seconds with no UE support    Time 8    Period Weeks    Status Partially Met    Target Date  06/01/20      PT LONG TERM GOAL #3   Title Pt to demonstrate tolerance to 6MWT c completion of 833f c 4WW, no LOB, supervision level assistance.    Baseline limited by fatigue, baseline deferred to next session. 4/4: 673 ft with rollator    Time 8    Period Weeks  Status Partially Met    Target Date 06/01/20      PT LONG TERM GOAL #4   Title Pt to tolerate 6MWT c 4WW, supervision level assessment >1043f to facilitate confidence and safety in limited community distance AMB.    Baseline Not performed yet. 4/4: 673 ft with rollator    Time 12    Period Weeks    Status Partially Met    Target Date 06/29/20      PT LONG TERM GOAL #5   Title Pt to perform 5xSTS from chair + airex hands free in <14sec to demonstrate improved motor control in transfers and bilat hip extension strength.    Baseline no baseline yet 4/4: 31.75 seconds with no UE support    Time 12    Period Weeks    Status Partially Met                 Plan - 04/26/20 1053    Clinical Impression Statement Patient continues to be highly motivated throughout physical therapy session. She continues to be limited by fatigue and shortness of breathe for prolonged muscle recruitment and mobility requiring occasional rest breaks. The patient continues to benefit from additional skilled PT services to improve gait pattern, balance and LE strength for improved quality of life    Personal Factors and Comorbidities Time since onset of injury/illness/exacerbation;Age;Comorbidity 3+;Past/Current Experience;Transportation    Comorbidities osteoporosis, anemia, hx cancer, hyperlipidemia    Examination-Activity Limitations Bathing;Bed Mobility;Dressing;Reach Overhead;Stairs;Stand;Toileting;Locomotion Level;Squat;Transfers;Bend;Hygiene/Grooming;Carry;Sit    Examination-Participation Restrictions Community Activity;Driving;Shop;Yard Work;Cleaning;Meal Prep    Stability/Clinical Decision Making Evolving/Moderate complexity    Rehab  Potential Good    PT Frequency 2x / week    PT Duration 8 weeks    PT Treatment/Interventions ADLs/Self Care Home Management;Aquatic Therapy;Cryotherapy;Electrical Stimulation;DME Instruction;Gait training;Stair training;Functional mobility training;Therapeutic activities;Therapeutic exercise;Balance training;Neuromuscular re-education;Patient/family education;Manual techniques;Passive range of motion;Dry needling;Spinal Manipulations;Joint Manipulations;Orthotic Fit/Training;Energy conservation    PT Next Visit Plan Reducing shuffling gait with turns. LLE strengthening.    PT Home Exercise Plan not administered this date    Consulted and Agree with Plan of Care Patient           Patient will benefit from skilled therapeutic intervention in order to improve the following deficits and impairments:  Abnormal gait,Decreased balance,Decreased mobility,Difficulty walking,Hypomobility,Decreased strength,Decreased knowledge of use of DME,Decreased coordination,Postural dysfunction,Impaired flexibility,Improper body mechanics,Impaired perceived functional ability,Decreased activity tolerance  Visit Diagnosis: Unsteadiness on feet  Abnormality of gait and mobility  Muscle weakness (generalized)     Problem List Patient Active Problem List   Diagnosis Date Noted  . Cognitive deficits   . Labile blood pressure   . Acute blood loss anemia   . Slow transit constipation   . Vascular headache   . 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 Hydrocephalus ([place] 07/03/2017  . 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 Pelvic fracture ([place] 04/18/2016  . CD (celiac disease) 08/04/2014  . Cancer of upper lobe of right lung ([place] 08/04/2014  . Age related osteoporosis 11/26/2013  . Absolute anemia 05/21/2013  . H/O malignant neoplasm 05/21/2013  . HLD (hyperlipidemia) 05/21/2013   MJanna Arch PT, DPT   04/26/2020, 11:13 AM  [place] [facility] [address] [address]Phone: [phone]  Fax:  [phone] Name: Krista CHAMBERMRN: [id number] of Birth: [birth year]

## 2020-04-28 ENCOUNTER — Other Ambulatory Visit: Payer: Self-pay

## 2020-04-28 ENCOUNTER — Ambulatory Visit: Payer: PPO

## 2020-04-28 DIAGNOSIS — R2681 Unsteadiness on feet: Secondary | ICD-10-CM

## 2020-04-28 DIAGNOSIS — R269 Unspecified abnormalities of gait and mobility: Secondary | ICD-10-CM

## 2020-04-28 DIAGNOSIS — M6281 Muscle weakness (generalized): Secondary | ICD-10-CM

## 2020-04-28 NOTE — Therapy (Signed)
[place] MAIN [facility] SERVICES [address] [place], [place], [address] Phone: [phone]   Fax:  [phone]  Physical Therapy Treatment  Patient Details  Name: Krista Evans MRN: [id number] Date of Birth: [birth year] Referring Provider (PT): Ramonita Lab   Encounter Date: 04/28/2020   PT End of Session - 04/28/20 1026    Visit Number 43    Number of Visits 65    Date for PT Re-Evaluation 06/29/20    Authorization Type [employer]    Authorization Time Period 02/10/20-04/06/20; 04/06/20-06/29/20    PT Start Time 1029    PT Stop Time 1114    PT Time Calculation (min) 45 min    Equipment Utilized During Treatment Gait belt    Activity Tolerance Patient tolerated treatment well;No increased pain    Behavior During Therapy WFL for tasks assessed/performed           Past Medical History:  Diagnosis Date  . Abnormal Q waves on electrocardiogram   . Anemia   . Aortic atherosclerosis ([place])    "I could possibly have it, my grandmother had it"  . Cancer [facility]) 05/2011   Right upper Lung CA with partial Lobectomy.  . Celiac disease   . Celiac disease   . Dyspnea    with exertion  . Hyperlipidemia   . Hyperlipidemia   . Lung mass   . Meningioma ([place])   . Osteoporosis   . Personal history of chemotherapy   . Personal history of radiation therapy     Past Surgical History:  Procedure Laterality Date  . LUNG LOBECTOMY     right lung  . VENTRICULOPERITONEAL SHUNT Right 07/03/2017   Procedure: SHUNT INSERTION VENTRICULAR-PERITONEAL;  Surgeon: Newman Pies, MD;  Location: [place];  Service: Neurosurgery;  Laterality: Right;    There were no vitals filed for this visit.   Subjective Assessment - 04/28/20 1059    Subjective Patient reports feeling very sore after last session but no pain. No falls or LOB since last session. Able to perform one sit to stand without UE support at home.    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 Patient has attended OP PT in the past year for LLE strengthening and balance with positive results, but has not been in attendance recently due to COVID-19 pandemic. PMH includes osteoporosis, anemia, celiac disease, hyperlipidemia, hx of R upper lobe mass with resection 6/13, hx of pneumonia, meningioma, major atherosclerosis. VP shunt has been placed 06/2017.    Currently in Pain? No/denies                   neuro re-ed By support bar:  static stand 30 seconds x2 trials no UE support   6" step seated toe taps for coordination,sequencing, and timing with muscle recruitment patterning x 30 seconds x 2 trials    Therex:  ambulate across stable and unstable surface outside. Negotiating changing surfaces from cement to sidewalk, across brick with turns, inclines and declines, and obstacles in pathway without LOB. Multiple seated rest breaks. Close CGA  With use of rollator x 26 minutes   Sit to stand from low surfaces with decreasing UE support, variety of surfaces from low iron bench, wooden chair, standard chair. Low surfaces more challenging and require min A x8   Seated: -hamstring lengthening stretch 30 seconds x 2 trials each LE   2.5lb ankle weight seated: -LAQ 10x each  LE -march 12x each LE  -heel toe raise 10x each LE       Patient's vitals monitored throughout the session.  Pt educated throughout session about proper posture and technique with exercises. Improved exercise technique, movement at target joints, use of target muscles after min to mod verbal, visual, tactile cues    Patient remains highly motivated throughout physical therapy session. Prolonged ambulation is increasing with two seated rest breaks required due to fatigue. She is challenged with uneven surfaces with left arm challenged by holding rollator neutral. The patient continues to benefit from additional skilled PT services to  improve gait pattern, balance and LE strength for improved quality of life                  PT Education - 04/28/20 1026    Education provided Yes    Education Details exercise technique, body mechanics    Person(s) Educated Patient    Methods Explanation;Demonstration;Tactile cues;Verbal cues    Comprehension Verbalized understanding;Returned demonstration;Verbal cues required;Tactile cues required            PT Short Term Goals - 04/19/20 1039      PT SHORT TERM GOAL #1   Title Pt to demonstrate improved hip extension strength AEB ability to perform 5 full height hooklying bridges.    Baseline unable to rise from chair hands free due to hip extension weakness    Time 4    Period Weeks    Status Partially Met    Target Date 05/04/20      PT SHORT TERM GOAL #2   Title Pt to demonstrate SLS balance >3 sec hands free to facilitate weight shift in gait and single limb gross motor coordination.    Baseline Requires modA for weightshift, unable to remain in SLS without BUE 4/4: unable to perform    Period Weeks    Status On-going    Target Date 05/04/20      PT SHORT TERM GOAL #3   Title Pt to demonstrate 5xSTS hands free from single airex pad in <20sec s LOB.    Baseline unable to rise from chair without BUE 4/4: 31.75 ft    Time 4    Period Weeks    Status Partially Met    Target Date 05/04/20             PT Long Term Goals - 04/19/20 1039      PT LONG TERM GOAL #1   Title Patient will increase FOTO score to equal to or greater than 65% to demonstrate statistically significant improvement in mobility and quality of life.    Baseline 10/20/19: 32% 11/24: 50.7%; 04/06/20    Time 8    Period Weeks    Status New    Target Date 06/01/20      PT LONG TERM GOAL #2   Title Patient will complete five times sit to stand hands free from chair + airex in < 16 seconds.    Baseline Unable to rise from airex without use of hands 4/4: 31.75 seconds with no UE support     Time 8    Period Weeks    Status Partially Met    Target Date 06/01/20      PT LONG TERM GOAL #3   Title Pt to demonstrate tolerance to 6MWT c completion of 866f c 4WW, no LOB, supervision level assistance.    Baseline limited by fatigue, baseline deferred to next session. 4/4: 673 ft with  rollator    Time 8    Period Weeks    Status Partially Met    Target Date 06/01/20      PT LONG TERM GOAL #4   Title Pt to tolerate 6MWT c 4WW, supervision level assessment >1079f to facilitate confidence and safety in limited community distance AMB.    Baseline Not performed yet. 4/4: 673 ft with rollator    Time 12    Period Weeks    Status Partially Met    Target Date 06/29/20      PT LONG TERM GOAL #5   Title Pt to perform 5xSTS from chair + airex hands free in <14sec to demonstrate improved motor control in transfers and bilat hip extension strength.    Baseline no baseline yet 4/4: 31.75 seconds with no UE support    Time 12    Period Weeks    Status Partially Met                 Plan - 04/28/20 1101    Clinical Impression Statement Patient remains highly motivated throughout physical therapy session. Prolonged ambulation is increasing with two seated rest breaks required due to fatigue. She is challenged with uneven surfaces with left arm challenged by holding rollator neutral. The patient continues to benefit from additional skilled PT services to improve gait pattern, balance and LE strength for improved quality of life    Personal Factors and Comorbidities Time since onset of injury/illness/exacerbation;Age;Comorbidity 3+;Past/Current Experience;Transportation    Comorbidities osteoporosis, anemia, hx cancer, hyperlipidemia    Examination-Activity Limitations Bathing;Bed Mobility;Dressing;Reach Overhead;Stairs;Stand;Toileting;Locomotion Level;Squat;Transfers;Bend;Hygiene/Grooming;Carry;Sit    Examination-Participation Restrictions Community Activity;Driving;Shop;Yard  Work;Cleaning;Meal Prep    Stability/Clinical Decision Making Evolving/Moderate complexity    Rehab Potential Good    PT Frequency 2x / week    PT Duration 8 weeks    PT Treatment/Interventions ADLs/Self Care Home Management;Aquatic Therapy;Cryotherapy;Electrical Stimulation;DME Instruction;Gait training;Stair training;Functional mobility training;Therapeutic activities;Therapeutic exercise;Balance training;Neuromuscular re-education;Patient/family education;Manual techniques;Passive range of motion;Dry needling;Spinal Manipulations;Joint Manipulations;Orthotic Fit/Training;Energy conservation    PT Next Visit Plan Reducing shuffling gait with turns. LLE strengthening.    PT Home Exercise Plan not administered this date    Consulted and Agree with Plan of Care Patient           Patient will benefit from skilled therapeutic intervention in order to improve the following deficits and impairments:  Abnormal gait,Decreased balance,Decreased mobility,Difficulty walking,Hypomobility,Decreased strength,Decreased knowledge of use of DME,Decreased coordination,Postural dysfunction,Impaired flexibility,Improper body mechanics,Impaired perceived functional ability,Decreased activity tolerance  Visit Diagnosis: Unsteadiness on feet  Abnormality of gait and mobility  Muscle weakness (generalized)     Problem List Patient Active Problem List   Diagnosis Date Noted  . Cognitive deficits   . Labile blood pressure   . Acute blood loss anemia   . Slow transit constipation   . Vascular headache   . 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 [place]cephalus ([place] 07/03/2017  . 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 Pelvic fracture ([place] 04/18/2016  . CD (celiac disease) 08/04/2014  . Cancer of [place] lobe of right lung ([place] 08/04/2014  . Age related osteoporosis 11/26/2013  . Absolute anemia 05/21/2013  . H/O malignant neoplasm 05/21/2013  . HLD (hyperlipidemia) 05/21/2013   Krista Evans  PT, DPT   04/28/2020, 11:15 AM  [place] [facility] [address] [address]Phone: [phone]  Fax:  [phone] Name: Krista LOUKSMRN: [id number] of Birth: [birth year]

## 2020-05-03 ENCOUNTER — Ambulatory Visit: Payer: PPO

## 2020-05-05 ENCOUNTER — Other Ambulatory Visit: Payer: Self-pay

## 2020-05-05 ENCOUNTER — Ambulatory Visit: Payer: PPO

## 2020-05-05 DIAGNOSIS — R2681 Unsteadiness on feet: Secondary | ICD-10-CM

## 2020-05-05 DIAGNOSIS — M6281 Muscle weakness (generalized): Secondary | ICD-10-CM

## 2020-05-05 DIAGNOSIS — R269 Unspecified abnormalities of gait and mobility: Secondary | ICD-10-CM

## 2020-05-05 NOTE — Therapy (Signed)
[place] MAIN [facility] SERVICES [address] [place], [place], [address] Phone: [phone]   Fax:  [phone]  Physical Therapy Treatment  Patient Details  Name: Krista Evans MRN: [id number] Date of Birth: 1945-07-05 Referring Provider (PT): Ramonita Lab   Encounter Date: 05/05/2020   PT End of Session - 05/05/20 1036    Visit Number 44    Number of Visits 65    Date for PT Re-Evaluation 06/29/20    Authorization Type [employer]    Authorization Time Period 02/10/20-04/06/20; 04/06/20-06/29/20    PT Start Time 1029    PT Stop Time 1114    PT Time Calculation (min) 45 min    Equipment Utilized During Treatment Gait belt    Activity Tolerance Patient tolerated treatment well;No increased pain    Behavior During Therapy WFL for tasks assessed/performed           Past Medical History:  Diagnosis Date  . Abnormal Q waves on electrocardiogram   . Anemia   . Aortic atherosclerosis ([place])    "I could possibly have it, my grandmother had it"  . Cancer [facility]) 05/2011   Right upper Lung CA with partial Lobectomy.  . Celiac disease   . Celiac disease   . Dyspnea    with exertion  . Hyperlipidemia   . Hyperlipidemia   . Lung mass   . Meningioma ([place])   . Osteoporosis   . Personal history of chemotherapy   . Personal history of radiation therapy     Past Surgical History:  Procedure Laterality Date  . LUNG LOBECTOMY     right lung  . VENTRICULOPERITONEAL SHUNT Right 07/03/2017   Procedure: SHUNT INSERTION VENTRICULAR-PERITONEAL;  Surgeon: Newman Pies, MD;  Location: [place];  Service: Neurosurgery;  Laterality: Right;    There were no vitals filed for this visit.   Subjective Assessment - 05/05/20 1035    Subjective Patient reports she had a good easter. No falls or LOB since last session.    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 Patient has  attended OP PT in the past year for LLE strengthening and balance with positive results, but has not been in attendance recently due to COVID-19 pandemic. PMH includes osteoporosis, anemia, celiac disease, hyperlipidemia, hx of R upper lobe mass with resection 6/13, hx of pneumonia, meningioma, major atherosclerosis. VP shunt has been placed 06/2017.    Currently in Pain? No/denies                      .      neuro re-ed  Access Code: [email or web address] URL: [email or web address] Date: 04/29/2020 Prepared by: Janna Arch  Program Notes  **Be sure to perform all exercises next to a countertop or sturdy piece of furniture in case you become unsteady.  Exercises  . Standing Heel Raise with Support - 1 x daily - 7 x weekly - 2 sets - 15 reps . Standing Toe Raises at Chair - 1 x daily - 7 x weekly - 2 sets - 15 reps . Mini Squat with Counter Support - 1 x daily - 7 x weekly - 2 sets - 15 reps . Lunge with Counter Support - 1 x daily - 7 x weekly - 2 sets - 15 reps . Standing Tandem Balance with Counter Support - 1 x daily - 7 x weekly - 2 sets - 2 reps - 30  hold . Standing Single Leg Stance with Counter Support - 1 x daily - 7 x weekly - 2 sets - 2 reps - 30 hold .  By support bar: Lateral step over and back orange hurdle: BUE support very challenging with spatial awareness x 10 trials each LE    6" step seated toe taps for coordination,sequencing, and timing with muscle recruitment patterning x 30 seconds x 2 trials    Therex:  Nustep Lvl 2 RPM> 60 for cardiovascular support and UE/LE coordination      2.5lb ankle weight seated: -LAQ 10x each LE -march 12x each LE  -heel toe raise 10x each LE -alternate IR/ER 10x each LE       Patient's vitals monitored throughout the session.  Pt educated throughout session about proper posture and technique with exercises. Improved exercise technique, movement at target joints, use of target muscles after min to mod verbal,  visual, tactile cues   Patient educated on advance HEP with patient demonstrates understanding performing entire HEP with PT guidance. She remains highly motivated but fearful of decreasing UE support in standing position resulting in increased shuffle steppage.  The patient continues to benefit from additional skilled PT services to improve gait pattern, balance and LE strength for improved quality of life                 PT Education - 05/05/20 1036    Education provided Yes    Education Details exercise technique, body mechanics, HEP    Person(s) Educated Patient    Methods Explanation;Demonstration;Tactile cues;Verbal cues;Handout    Comprehension Verbalized understanding;Returned demonstration;Verbal cues required;Tactile cues required            PT Short Term Goals - 04/19/20 1039      PT SHORT TERM GOAL #1   Title Pt to demonstrate improved hip extension strength AEB ability to perform 5 full height hooklying bridges.    Baseline unable to rise from chair hands free due to hip extension weakness    Time 4    Period Weeks    Status Partially Met    Target Date 05/04/20      PT SHORT TERM GOAL #2   Title Pt to demonstrate SLS balance >3 sec hands free to facilitate weight shift in gait and single limb gross motor coordination.    Baseline Requires modA for weightshift, unable to remain in SLS without BUE 4/4: unable to perform    Period Weeks    Status On-going    Target Date 05/04/20      PT SHORT TERM GOAL #3   Title Pt to demonstrate 5xSTS hands free from single airex pad in <20sec s LOB.    Baseline unable to rise from chair without BUE 4/4: 31.75 ft    Time 4    Period Weeks    Status Partially Met    Target Date 05/04/20             PT Long Term Goals - 04/19/20 1039      PT LONG TERM GOAL #1   Title Patient will increase FOTO score to equal to or greater than 65% to demonstrate statistically significant improvement in mobility and quality of  life.    Baseline 10/20/19: 32% 11/24: 50.7%; 04/06/20    Time 8    Period Weeks    Status New    Target Date 06/01/20      PT LONG TERM GOAL #2   Title Patient will complete  five times sit to stand hands free from chair + airex in < 16 seconds.    Baseline Unable to rise from airex without use of hands 4/4: 31.75 seconds with no UE support    Time 8    Period Weeks    Status Partially Met    Target Date 06/01/20      PT LONG TERM GOAL #3   Title Pt to demonstrate tolerance to 6MWT c completion of 840f c 4WW, no LOB, supervision level assistance.    Baseline limited by fatigue, baseline deferred to next session. 4/4: 673 ft with rollator    Time 8    Period Weeks    Status Partially Met    Target Date 06/01/20      PT LONG TERM GOAL #4   Title Pt to tolerate 6MWT c 4WW, supervision level assessment >10011fto facilitate confidence and safety in limited community distance AMB.    Baseline Not performed yet. 4/4: 673 ft with rollator    Time 12    Period Weeks    Status Partially Met    Target Date 06/29/20      PT LONG TERM GOAL #5   Title Pt to perform 5xSTS from chair + airex hands free in <14sec to demonstrate improved motor control in transfers and bilat hip extension strength.    Baseline no baseline yet 4/4: 31.75 seconds with no UE support    Time 12    Period Weeks    Status Partially Met                 Plan - 05/05/20 1102    Clinical Impression Statement Patient educated on advance HEP with patient demonstrates understanding performing entire HEP with PT guidance. She remains highly motivated but fearful of decreasing UE support in standing position resulting in increased shuffle steppage.  The patient continues to benefit from additional skilled PT services to improve gait pattern, balance and LE strength for improved quality of life    Personal Factors and Comorbidities Time since onset of injury/illness/exacerbation;Age;Comorbidity 3+;Past/Current  Experience;Transportation    Comorbidities osteoporosis, anemia, hx cancer, hyperlipidemia    Examination-Activity Limitations Bathing;Bed Mobility;Dressing;Reach Overhead;Stairs;Stand;Toileting;Locomotion Level;Squat;Transfers;Bend;Hygiene/Grooming;Carry;Sit    Examination-Participation Restrictions Community Activity;Driving;Shop;Yard Work;Cleaning;Meal Prep    Stability/Clinical Decision Making Evolving/Moderate complexity    Rehab Potential Good    PT Frequency 2x / week    PT Duration 8 weeks    PT Treatment/Interventions ADLs/Self Care Home Management;Aquatic Therapy;Cryotherapy;Electrical Stimulation;DME Instruction;Gait training;Stair training;Functional mobility training;Therapeutic activities;Therapeutic exercise;Balance training;Neuromuscular re-education;Patient/family education;Manual techniques;Passive range of motion;Dry needling;Spinal Manipulations;Joint Manipulations;Orthotic Fit/Training;Energy conservation    PT Next Visit Plan Reducing shuffling gait with turns. LLE strengthening.    PT Home Exercise Plan not administered this date    Consulted and Agree with Plan of Care Patient           Patient will benefit from skilled therapeutic intervention in order to improve the following deficits and impairments:  Abnormal gait,Decreased balance,Decreased mobility,Difficulty walking,Hypomobility,Decreased strength,Decreased knowledge of use of DME,Decreased coordination,Postural dysfunction,Impaired flexibility,Improper body mechanics,Impaired perceived functional ability,Decreased activity tolerance  Visit Diagnosis: Unsteadiness on feet  Abnormality of gait and mobility  Muscle weakness (generalized)     Problem List Patient Active Problem List   Diagnosis Date Noted  . Cognitive deficits   . Labile blood pressure   . Acute blood loss anemia   . Slow transit constipation   . Vascular headache   . 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 [place]cephalus ([place] 07/03/2017  .  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 Pelvic fracture ([place]) 04/18/2016  . CD (celiac disease) 08/04/2014  . Cancer of upper lobe of right lung ([place]) 08/04/2014  . Age related osteoporosis 11/26/2013  . Absolute anemia 05/21/2013  . H/O malignant neoplasm 05/21/2013  . HLD (hyperlipidemia) 05/21/2013   Janna Arch, PT, DPT   05/05/2020, 11:16 AM  [place] MAIN [facility] SERVICES [address] [place], [place], [address] Phone: [phone]   Fax:  [phone]  Name: Krista Evans MRN: [id number] Date of Birth: Apr 28, 1945

## 2020-05-10 ENCOUNTER — Ambulatory Visit: Payer: PPO

## 2020-05-10 ENCOUNTER — Other Ambulatory Visit: Payer: Self-pay

## 2020-05-10 DIAGNOSIS — M6281 Muscle weakness (generalized): Secondary | ICD-10-CM

## 2020-05-10 DIAGNOSIS — R2681 Unsteadiness on feet: Secondary | ICD-10-CM | POA: Diagnosis not present

## 2020-05-10 DIAGNOSIS — R269 Unspecified abnormalities of gait and mobility: Secondary | ICD-10-CM

## 2020-05-10 NOTE — Therapy (Signed)
[place] MAIN [facility] SERVICES [address] [place], [place], [address] Phone: [phone]   Fax:  [phone]  Physical Therapy Treatment  Patient Details  Name: Krista Evans MRN: [id number] Date of Birth: 06-11-1945 Referring Provider (PT): Ramonita Lab   Encounter Date: 05/10/2020   PT End of Session - 05/10/20 0937    Visit Number 45    Number of Visits 65    Date for PT Re-Evaluation 06/29/20    Authorization Type [employer]    Authorization Time Period 02/10/20-04/06/20; 04/06/20-06/29/20    PT Start Time 0941    PT Stop Time 1027    PT Time Calculation (min) 46 min    Equipment Utilized During Treatment Gait belt    Activity Tolerance Patient tolerated treatment well;No increased pain    Behavior During Therapy WFL for tasks assessed/performed           Past Medical History:  Diagnosis Date  . Abnormal Q waves on electrocardiogram   . Anemia   . Aortic atherosclerosis ([place])    "I could possibly have it, my grandmother had it"  . Cancer [facility]) 05/2011   Right upper Lung CA with partial Lobectomy.  . Celiac disease   . Celiac disease   . Dyspnea    with exertion  . Hyperlipidemia   . Hyperlipidemia   . Lung mass   . Meningioma ([place])   . Osteoporosis   . Personal history of chemotherapy   . Personal history of radiation therapy     Past Surgical History:  Procedure Laterality Date  . LUNG LOBECTOMY     right lung  . VENTRICULOPERITONEAL SHUNT Right 07/03/2017   Procedure: SHUNT INSERTION VENTRICULAR-PERITONEAL;  Surgeon: Newman Pies, MD;  Location: [place];  Service: Neurosurgery;  Laterality: Right;    There were no vitals filed for this visit.   Subjective Assessment - 05/10/20 1020    Subjective Patient reports compliance with HEP, challenged by lunges but has been able to perform them.    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 Patient has attended OP PT in the past year for LLE strengthening and balance with positive results, but has not been in attendance recently due to COVID-19 pandemic. PMH includes osteoporosis, anemia, celiac disease, hyperlipidemia, hx of R upper lobe mass with resection 6/13, hx of pneumonia, meningioma, major atherosclerosis. VP shunt has been placed 06/2017.    Currently in Pain? No/denies                  neuro re-ed By support bar:  static stand 30 seconds x2 trials no UE support    6" step seated toe taps for coordination, sequencing, and timing with muscle recruitment patterning x 30 seconds x 2 trials    Therex:  ambulate across stable and unstable surface outside. Negotiating changing surfaces from cement to sidewalk, across brick with turns, inclines and declines, and obstacles in pathway without LOB. Multiple seated rest breaks. Close CGA  With use of rollator x 26 minutes    Sit to stand from low surfaces with decreasing UE support, variety of surfaces from low iron bench, wooden chair, standard chair. Low surfaces more challenging and require min A x8    Seated: hamstring lengthening stretch 30 seconds x 2 trials each LE RTB hamstring curl 15x each LE RTB around knees 10x  RTB around ankles: alternating LAQ 10x each LE's with cues  for keeping feet wider Adduction ball squeeze 10x       Patient's vitals monitored throughout the session.  Pt educated throughout session about proper posture and technique with exercises. Improved exercise technique, movement at target joints, use of target muscles after min to mod verbal, visual, tactile cues   Patient is challenged with changes in surface specifically inclines/declines. She requires stabilization of walker and encouragment for foot clearance to perform decline. She remains highly motivated despite fear of LOB. The patient continues to benefit from additional skilled PT services to improve gait pattern, balance  and LE strength for improved quality of life                       PT Education - 05/10/20 0936    Education provided Yes    Education Details exercise technique, body mechanics    Person(s) Educated Patient    Methods Explanation;Tactile cues;Demonstration;Verbal cues    Comprehension Verbalized understanding;Returned demonstration;Verbal cues required;Tactile cues required            PT Short Term Goals - 04/19/20 1039      PT SHORT TERM GOAL #1   Title Pt to demonstrate improved hip extension strength AEB ability to perform 5 full height hooklying bridges.    Baseline unable to rise from chair hands free due to hip extension weakness    Time 4    Period Weeks    Status Partially Met    Target Date 05/04/20      PT SHORT TERM GOAL #2   Title Pt to demonstrate SLS balance >3 sec hands free to facilitate weight shift in gait and single limb gross motor coordination.    Baseline Requires modA for weightshift, unable to remain in SLS without BUE 4/4: unable to perform    Period Weeks    Status On-going    Target Date 05/04/20      PT SHORT TERM GOAL #3   Title Pt to demonstrate 5xSTS hands free from single airex pad in <20sec s LOB.    Baseline unable to rise from chair without BUE 4/4: 31.75 ft    Time 4    Period Weeks    Status Partially Met    Target Date 05/04/20             PT Long Term Goals - 04/19/20 1039      PT LONG TERM GOAL #1   Title Patient will increase FOTO score to equal to or greater than 65% to demonstrate statistically significant improvement in mobility and quality of life.    Baseline 10/20/19: 32% 11/24: 50.7%; 04/06/20    Time 8    Period Weeks    Status New    Target Date 06/01/20      PT LONG TERM GOAL #2   Title Patient will complete five times sit to stand hands free from chair + airex in < 16 seconds.    Baseline Unable to rise from airex without use of hands 4/4: 31.75 seconds with no UE support    Time 8     Period Weeks    Status Partially Met    Target Date 06/01/20      PT LONG TERM GOAL #3   Title Pt to demonstrate tolerance to 6MWT c completion of 89f c 4WW, no LOB, supervision level assistance.    Baseline limited by fatigue, baseline deferred to next session. 4/4: 673 ft with rollator    Time  8    Period Weeks    Status Partially Met    Target Date 06/01/20      PT LONG TERM GOAL #4   Title Pt to tolerate 6MWT c 4WW, supervision level assessment >1063f to facilitate confidence and safety in limited community distance AMB.    Baseline Not performed yet. 4/4: 673 ft with rollator    Time 12    Period Weeks    Status Partially Met    Target Date 06/29/20      PT LONG TERM GOAL #5   Title Pt to perform 5xSTS from chair + airex hands free in <14sec to demonstrate improved motor control in transfers and bilat hip extension strength.    Baseline no baseline yet 4/4: 31.75 seconds with no UE support    Time 12    Period Weeks    Status Partially Met                 Plan - 05/10/20 1023    Clinical Impression Statement Patient is challenged with changes in surface specifically inclines/declines. She requires stabilization of walker and encouragment for foot clearance to perform decline. She remains highly motivated despite fear of LOB. The patient continues to benefit from additional skilled PT services to improve gait pattern, balance and LE strength for improved quality of life    Personal Factors and Comorbidities Time since onset of injury/illness/exacerbation;Age;Comorbidity 3+;Past/Current Experience;Transportation    Comorbidities osteoporosis, anemia, hx cancer, hyperlipidemia    Examination-Activity Limitations Bathing;Bed Mobility;Dressing;Reach Overhead;Stairs;Stand;Toileting;Locomotion Level;Squat;Transfers;Bend;Hygiene/Grooming;Carry;Sit    Examination-Participation Restrictions Community Activity;Driving;Shop;Yard Work;Cleaning;Meal Prep    Stability/Clinical  Decision Making Evolving/Moderate complexity    Rehab Potential Good    PT Frequency 2x / week    PT Duration 8 weeks    PT Treatment/Interventions ADLs/Self Care Home Management;Aquatic Therapy;Cryotherapy;Electrical Stimulation;DME Instruction;Gait training;Stair training;Functional mobility training;Therapeutic activities;Therapeutic exercise;Balance training;Neuromuscular re-education;Patient/family education;Manual techniques;Passive range of motion;Dry needling;Spinal Manipulations;Joint Manipulations;Orthotic Fit/Training;Energy conservation    PT Next Visit Plan Reducing shuffling gait with turns. LLE strengthening.    PT Home Exercise Plan not administered this date    Consulted and Agree with Plan of Care Patient           Patient will benefit from skilled therapeutic intervention in order to improve the following deficits and impairments:  Abnormal gait,Decreased balance,Decreased mobility,Difficulty walking,Hypomobility,Decreased strength,Decreased knowledge of use of DME,Decreased coordination,Postural dysfunction,Impaired flexibility,Improper body mechanics,Impaired perceived functional ability,Decreased activity tolerance  Visit Diagnosis: Unsteadiness on feet  Abnormality of gait and mobility  Muscle weakness (generalized)     Problem List Patient Active Problem List   Diagnosis Date Noted  . Cognitive deficits   . Labile blood pressure   . Acute blood loss anemia   . Slow transit constipation   . Vascular headache   . 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 Hydrocephalus ([place] 07/03/2017  . 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 Pelvic fracture ([place] 04/18/2016  . CD (celiac disease) 08/04/2014  . Cancer of [place] lobe of right lung ([place] 08/04/2014  . Age related osteoporosis 11/26/2013  . Absolute anemia 05/21/2013  . H/O malignant neoplasm 05/21/2013  . HLD (hyperlipidemia) 05/21/2013   MJanna Arch PT, DPT   05/10/2020, 10:27 AM  [place] [facility] [address] [address]Phone: [phone]  Fax:  [phone] Name: Krista DIGIOIAMRN: [id number] of Birth: [birth year]

## 2020-05-12 ENCOUNTER — Other Ambulatory Visit: Payer: Self-pay

## 2020-05-12 ENCOUNTER — Ambulatory Visit: Payer: PPO

## 2020-05-12 DIAGNOSIS — R2681 Unsteadiness on feet: Secondary | ICD-10-CM | POA: Diagnosis not present

## 2020-05-12 DIAGNOSIS — M6281 Muscle weakness (generalized): Secondary | ICD-10-CM

## 2020-05-12 DIAGNOSIS — R269 Unspecified abnormalities of gait and mobility: Secondary | ICD-10-CM

## 2020-05-12 NOTE — Therapy (Signed)
[place] MAIN [facility] SERVICES [address] [place], [place], [address] Phone: [phone]   Fax:  [phone]  Physical Therapy Treatment  Patient Details  Name: Krista Evans MRN: [id number] Date of Birth: 04-Feb-1945 Referring Provider (PT): Ramonita Lab   Encounter Date: 05/12/2020   PT End of Session - 05/12/20 1037    Visit Number [id number]    Number of Visits 65    Date for PT Re-Evaluation 06/29/20    Authorization Type [employer]    Authorization Time Period 02/10/20-04/06/20; 04/06/20-06/29/20    PT Start Time 1030    PT Stop Time 1114    PT Time Calculation (min) 44 min    Equipment Utilized During Treatment Gait belt    Activity Tolerance Patient tolerated treatment well;No increased pain    Behavior During Therapy WFL for tasks assessed/performed           Past Medical History:  Diagnosis Date  . Abnormal Q waves on electrocardiogram   . Anemia   . Aortic atherosclerosis ([place])    "I could possibly have it, my grandmother had it"  . Cancer [facility]) 05/2011   Right upper Lung CA with partial Lobectomy.  . Celiac disease   . Celiac disease   . Dyspnea    with exertion  . Hyperlipidemia   . Hyperlipidemia   . Lung mass   . Meningioma ([place])   . Osteoporosis   . Personal history of chemotherapy   . Personal history of radiation therapy     Past Surgical History:  Procedure Laterality Date  . LUNG LOBECTOMY     right lung  . VENTRICULOPERITONEAL SHUNT Right 07/03/2017   Procedure: SHUNT INSERTION VENTRICULAR-PERITONEAL;  Surgeon: Newman Pies, MD;  Location: [place];  Service: Neurosurgery;  Laterality: Right;    There were no vitals filed for this visit.   Subjective Assessment - 05/12/20 1036    Subjective Patient reports no falls or LOB since last session. Has been compliant with HEP.    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 Patient  has attended OP PT in the past year for LLE strengthening and balance with positive results, but has not been in attendance recently due to COVID-19 pandemic. PMH includes osteoporosis, anemia, celiac disease, hyperlipidemia, hx of R upper lobe mass with resection 6/13, hx of pneumonia, meningioma, major atherosclerosis. VP shunt has been placed 06/2017.    Currently in Pain? No/denies                    neuro re-ed By support bar:  static stand 30 seconds x2 trials no UE support    weave between four cones with focus on picking up feet and reducing episodes of shuffle stepping. Able to perform x 2 trials with improved negotiation and decreased shuffle steps.   2 cone tap: BUE support, cues for L foot to fully tap cone; 12x LLE, 10x RLE  Single leg stance with BUE support 30 second holds  Seated balloon taps reaching inside/outside BOS x 2 minutes for coordination, spatial awareness and reaction timing.   Seated LAQ kicking balloon for coordination, reaction timing, and sequencxing of muscle contraction x15  Seated 6" step toe taps 30 seconds x2 trials for coordination, high speed activation   Therex:  Nustep Lvl 2 seat position 9 RPM> 60 for cardiovascular challenge and UE/LE coordination 4 minutes   holding onto railing 10x cue  to squat to depth of chair, close CGA Lateral step over theraband on floor 10x each direction  Seated: RTB hamstring curl 15x each LE RTB around knees 10x  RTB around ankles: alternating LAQ 10x each LE's with cues for keeping feet wide Adduction ball squeeze 12x    heel toe raises cues for equal movement between L and R 10x Seated LAQ with green ball adduction between feet 10  ;very challenging for L to mirror RLE     Patient's vitals monitored throughout the session.  Pt educated throughout session about proper posture and technique with exercises. Improved exercise technique, movement at target joints, use of target muscles after min to mod verbal,  visual, tactile cues   Patient is highly motivated throughout session despite fatigue with prolonged standing interventions. Object negotiation is improving with decreased episodes of shuffle stepping when weaving between cones.  The patient continues to benefit from additional skilled PT services to improve gait pattern, balance and LE strength for improved quality of life                       PT Education - 05/12/20 1037    Education provided Yes    Education Details exercise technique, body mechanics    Person(s) Educated Patient    Methods Explanation;Demonstration;Tactile cues;Verbal cues    Comprehension Verbalized understanding;Returned demonstration;Verbal cues required;Tactile cues required            PT Short Term Goals - 04/19/20 1039      PT SHORT TERM GOAL #1   Title Pt to demonstrate improved hip extension strength AEB ability to perform 5 full height hooklying bridges.    Baseline unable to rise from chair hands free due to hip extension weakness    Time 4    Period Weeks    Status Partially Met    Target Date 05/04/20      PT SHORT TERM GOAL #2   Title Pt to demonstrate SLS balance >3 sec hands free to facilitate weight shift in gait and single limb gross motor coordination.    Baseline Requires modA for weightshift, unable to remain in SLS without BUE 4/4: unable to perform    Period Weeks    Status On-going    Target Date 05/04/20      PT SHORT TERM GOAL #3   Title Pt to demonstrate 5xSTS hands free from single airex pad in <20sec s LOB.    Baseline unable to rise from chair without BUE 4/4: 31.75 ft    Time 4    Period Weeks    Status Partially Met    Target Date 05/04/20             PT Long Term Goals - 04/19/20 1039      PT LONG TERM GOAL #1   Title Patient will increase FOTO score to equal to or greater than 65% to demonstrate statistically significant improvement in mobility and quality of life.    Baseline 10/20/19: 32%  11/24: 50.7%; 04/06/20    Time 8    Period Weeks    Status New    Target Date 06/01/20      PT LONG TERM GOAL #2   Title Patient will complete five times sit to stand hands free from chair + airex in < 16 seconds.    Baseline Unable to rise from airex without use of hands 4/4: 31.75 seconds with no UE support    Time 8      Period Weeks    Status Partially Met    Target Date 06/01/20      PT LONG TERM GOAL #3   Title Pt to demonstrate tolerance to 6MWT c completion of 800ft c 4WW, no LOB, supervision level assistance.    Baseline limited by fatigue, baseline deferred to next session. 4/4: 673 ft with rollator    Time 8    Period Weeks    Status Partially Met    Target Date 06/01/20      PT LONG TERM GOAL #4   Title Pt to tolerate 6MWT c 4WW, supervision level assessment >1000ft to facilitate confidence and safety in limited community distance AMB.    Baseline Not performed yet. 4/4: 673 ft with rollator    Time 12    Period Weeks    Status Partially Met    Target Date 06/29/20      PT LONG TERM GOAL #5   Title Pt to perform 5xSTS from chair + airex hands free in <14sec to demonstrate improved motor control in transfers and bilat hip extension strength.    Baseline no baseline yet 4/4: 31.75 seconds with no UE support    Time 12    Period Weeks    Status Partially Met                 Plan - 05/12/20 1102    Clinical Impression Statement Patient is highly motivated throughout session despite fatigue with prolonged standing interventions. Object negotiation is improving with decreased episodes of shuffle stepping when weaving between cones.  The patient continues to benefit from additional skilled PT services to improve gait pattern, balance and LE strength for improved quality of life    Personal Factors and Comorbidities Time since onset of injury/illness/exacerbation;Age;Comorbidity 3+;Past/Current Experience;Transportation    Comorbidities osteoporosis, anemia, hx  cancer, hyperlipidemia    Examination-Activity Limitations Bathing;Bed Mobility;Dressing;Reach Overhead;Stairs;Stand;Toileting;Locomotion Level;Squat;Transfers;Bend;Hygiene/Grooming;Carry;Sit    Examination-Participation Restrictions Community Activity;Driving;Shop;Yard Work;Cleaning;Meal Prep    Stability/Clinical Decision Making Evolving/Moderate complexity    Rehab Potential Good    PT Frequency 2x / week    PT Duration 8 weeks    PT Treatment/Interventions ADLs/Self Care Home Management;Aquatic Therapy;Cryotherapy;Electrical Stimulation;DME Instruction;Gait training;Stair training;Functional mobility training;Therapeutic activities;Therapeutic exercise;Balance training;Neuromuscular re-education;Patient/family education;Manual techniques;Passive range of motion;Dry needling;Spinal Manipulations;Joint Manipulations;Orthotic Fit/Training;Energy conservation    PT Next Visit Plan Reducing shuffling gait with turns. LLE strengthening.    PT Home Exercise Plan not administered this date    Consulted and Agree with Plan of Care Patient           Patient will benefit from skilled therapeutic intervention in order to improve the following deficits and impairments:  Abnormal gait,Decreased balance,Decreased mobility,Difficulty walking,Hypomobility,Decreased strength,Decreased knowledge of use of DME,Decreased coordination,Postural dysfunction,Impaired flexibility,Improper body mechanics,Impaired perceived functional ability,Decreased activity tolerance  Visit Diagnosis: Unsteadiness on feet  Abnormality of gait and mobility  Muscle weakness (generalized)     Problem List Patient Active Problem List   Diagnosis Date Noted  . Cognitive deficits   . Labile blood pressure   . Acute blood loss anemia   . Slow transit constipation   . Vascular headache   . 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 Hydrocephalus ([facility]) 07/03/2017  . 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 Pelvic  fracture (H[facility]) 04/18/2016  . CD (celiac disease) 08/04/2014  . Cancer of upper lobe of right lung (H[facility]) 08/04/2014  . Age related osteoporosis 11/26/2013  . Absolute anemia 05/21/2013  . H/O malignant neoplasm 05/21/2013  . HLD (hyperlipidemia)   05/21/2013   Janna Arch, PT, DPT   05/12/2020, 11:14 AM  [place] MAIN [facility] SERVICES [address] [place], [place], [address] Phone: [phone]   Fax:  [phone]  Name: ERICKA MARCELLUS MRN: [id number] Date of Birth: [birth year]

## 2020-05-17 ENCOUNTER — Other Ambulatory Visit: Payer: Self-pay

## 2020-05-17 ENCOUNTER — Ambulatory Visit: Payer: PPO | Attending: Internal Medicine

## 2020-05-17 DIAGNOSIS — R2681 Unsteadiness on feet: Secondary | ICD-10-CM | POA: Diagnosis not present

## 2020-05-17 DIAGNOSIS — M6281 Muscle weakness (generalized): Secondary | ICD-10-CM | POA: Insufficient documentation

## 2020-05-17 DIAGNOSIS — R269 Unspecified abnormalities of gait and mobility: Secondary | ICD-10-CM | POA: Diagnosis not present

## 2020-05-17 NOTE — Therapy (Signed)
[place] MAIN [facility] SERVICES [address] [place], [place], [address] Phone: [phone]   Fax:  [phone]  Physical Therapy Treatment  Patient Details  Name: Krista Evans MRN: [id number] Date of Birth: Jan 31, 1945 Referring Provider (PT): Ramonita Lab   Encounter Date: 05/17/2020   PT End of Session - 05/17/20 0935    Visit Number 60    Number of Visits 65    Date for PT Re-Evaluation 06/29/20    Authorization Type [employer]    Authorization Time Period 02/10/20-04/06/20; 04/06/20-06/29/20    PT Start Time 0930    PT Stop Time 1014    PT Time Calculation (min) 44 min    Equipment Utilized During Treatment Gait belt    Activity Tolerance Patient tolerated treatment well;No increased pain    Behavior During Therapy WFL for tasks assessed/performed           Past Medical History:  Diagnosis Date  . Abnormal Q waves on electrocardiogram   . Anemia   . Aortic atherosclerosis ([place])    "I could possibly have it, my grandmother had it"  . Cancer [facility]) 05/2011   Right upper Lung CA with partial Lobectomy.  . Celiac disease   . Celiac disease   . Dyspnea    with exertion  . Hyperlipidemia   . Hyperlipidemia   . Lung mass   . Meningioma ([place])   . Osteoporosis   . Personal history of chemotherapy   . Personal history of radiation therapy     Past Surgical History:  Procedure Laterality Date  . LUNG LOBECTOMY     right lung  . VENTRICULOPERITONEAL SHUNT Right 07/03/2017   Procedure: SHUNT INSERTION VENTRICULAR-PERITONEAL;  Surgeon: Newman Pies, MD;  Location: [place];  Service: Neurosurgery;  Laterality: Right;    There were no vitals filed for this visit.   Subjective Assessment - 05/17/20 0934    Subjective Patient reports no falls or LOB. Compliant with her HEP.    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 Patient has attended OP PT in the  past year for LLE strengthening and balance with positive results, but has not been in attendance recently due to COVID-19 pandemic. PMH includes osteoporosis, anemia, celiac disease, hyperlipidemia, hx of R upper lobe mass with resection 6/13, hx of pneumonia, meningioma, major atherosclerosis. VP shunt has been placed 06/2017.    Currently in Pain? No/denies                 neuro re-ed By support bar:  static stand 30 seconds x2 trials no UE support    2 cone tap: BUE support, cues for L foot to fully tap cone; 12x LLE, 10x RLE   Single leg stance with BUE support 30 second holds   Seated balloon taps reaching inside/outside BOS x 2 minutes for coordination, spatial awareness and reaction timing.    negotiate perimeter of room looking for cones hidden above, below and on lateral aspects for visual scanning and safe object pick up/negotiation.    Ambulate 5 ft with SUE support x 4 trials; very challenging for patient, max cueing for picking up feet to decrease shuffle. Challenging for patient to turn to sit but improving with each repetition.    Therex:  Nustep Lvl 2 seat position 9 RPM> 60 for cardiovascular challenge and UE/LE coordination 4 minutes   Holding onto railing lunges 10x modified each LE, cues  for knee abduction to obtain neutral alignment.    Seated: RTB hamstring curl 15x each LE RTB around knees 10x  Adduction ball squeeze 12x   Seated LAQ with green ball adduction between feet 10  ;very challenging for L to mirror RLE     Patient's vitals monitored throughout the session.  Pt educated throughout session about proper posture and technique with exercises. Improved exercise technique, movement at target joints, use of target muscles after min to mod verbal, visual, tactile cues   Patient is able to tolerate ambulation with single hand hold for first time this session. She is fearful but highly motivated for progression for increased independence.   The patient  continues to benefit from additional skilled PT services to improve gait pattern, balance and LE strength for improved quality of life                        PT Education - 05/17/20 0935    Education provided Yes    Education Details exercise technique, body mechanics    Person(s) Educated Patient    Methods Tactile cues;Demonstration;Explanation;Verbal cues    Comprehension Verbalized understanding;Returned demonstration;Verbal cues required;Tactile cues required            PT Short Term Goals - 04/19/20 1039      PT SHORT TERM GOAL #1   Title Pt to demonstrate improved hip extension strength AEB ability to perform 5 full height hooklying bridges.    Baseline unable to rise from chair hands free due to hip extension weakness    Time 4    Period Weeks    Status Partially Met    Target Date 05/04/20      PT SHORT TERM GOAL #2   Title Pt to demonstrate SLS balance >3 sec hands free to facilitate weight shift in gait and single limb gross motor coordination.    Baseline Requires modA for weightshift, unable to remain in SLS without BUE 4/4: unable to perform    Period Weeks    Status On-going    Target Date 05/04/20      PT SHORT TERM GOAL #3   Title Pt to demonstrate 5xSTS hands free from single airex pad in <20sec s LOB.    Baseline unable to rise from chair without BUE 4/4: 31.75 ft    Time 4    Period Weeks    Status Partially Met    Target Date 05/04/20             PT Long Term Goals - 04/19/20 1039      PT LONG TERM GOAL #1   Title Patient will increase FOTO score to equal to or greater than 65% to demonstrate statistically significant improvement in mobility and quality of life.    Baseline 10/20/19: 32% 11/24: 50.7%; 04/06/20    Time 8    Period Weeks    Status New    Target Date 06/01/20      PT LONG TERM GOAL #2   Title Patient will complete five times sit to stand hands free from chair + airex in < 16 seconds.    Baseline Unable to rise  from airex without use of hands 4/4: 31.75 seconds with no UE support    Time 8    Period Weeks    Status Partially Met    Target Date 06/01/20      PT LONG TERM GOAL #3   Title Pt to demonstrate tolerance  to 6MWT c completion of 889f c 4WW, no LOB, supervision level assistance.    Baseline limited by fatigue, baseline deferred to next session. 4/4: 673 ft with rollator    Time 8    Period Weeks    Status Partially Met    Target Date 06/01/20      PT LONG TERM GOAL #4   Title Pt to tolerate 6MWT c 4WW, supervision level assessment >10092fto facilitate confidence and safety in limited community distance AMB.    Baseline Not performed yet. 4/4: 673 ft with rollator    Time 12    Period Weeks    Status Partially Met    Target Date 06/29/20      PT LONG TERM GOAL #5   Title Pt to perform 5xSTS from chair + airex hands free in <14sec to demonstrate improved motor control in transfers and bilat hip extension strength.    Baseline no baseline yet 4/4: 31.75 seconds with no UE support    Time 12    Period Weeks    Status Partially Met                  Patient will benefit from skilled therapeutic intervention in order to improve the following deficits and impairments:     Visit Diagnosis: Unsteadiness on feet  Abnormality of gait and mobility  Muscle weakness (generalized)     Problem List Patient Active Problem List   Diagnosis Date Noted  . Cognitive deficits   . Labile blood pressure   . Acute blood loss anemia   . Slow transit constipation   . Vascular headache   . 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 Hydrocephalus ([facility]06/18/2019  . Communicating hydrocephalus ([place]CC[place]06/18/2019  . Pelvic fracture ([place]04/03/2016  . CD (celiac disease) 08/04/2014  . Cancer of upper lobe of right lung ([place]07/19/2016  . Age related osteoporosis 11/26/2013  . Absolute anemia 05/21/2013  . H/O malignant neoplasm 05/21/2013  . HLD (hyperlipidemia) 05/21/2013     MaJanna ArchPT, DPT   05/17/2020, 10:14 AM  [place] RESt [facility] IncERVICES [address]d[place]hone: [phone] Fax:  [phone]: MaKUMIKO FISHMANRN: [id number] of Birth: 3/13-Mar-1945

## 2020-05-20 ENCOUNTER — Ambulatory Visit: Payer: PPO

## 2020-05-20 ENCOUNTER — Other Ambulatory Visit: Payer: Self-pay

## 2020-05-20 DIAGNOSIS — R2681 Unsteadiness on feet: Secondary | ICD-10-CM

## 2020-05-20 DIAGNOSIS — R269 Unspecified abnormalities of gait and mobility: Secondary | ICD-10-CM

## 2020-05-20 DIAGNOSIS — M6281 Muscle weakness (generalized): Secondary | ICD-10-CM

## 2020-05-20 NOTE — Therapy (Signed)
[place] MAIN [facility] SERVICES [address] [place], [place], [address] Phone: [phone]   Fax:  [phone]  Physical Therapy Treatment  Patient Details  Name: DETTA Evans MRN: [id number] Date of Birth: [birth year] Referring Provider (PT): Ramonita Lab   Encounter Date: 05/20/2020   PT End of Session - 05/20/20 0849    Visit Number 48    Number of Visits 65    Date for PT Re-Evaluation 06/29/20    Authorization Type [employer]    Authorization Time Period 02/10/20-04/06/20; 04/06/20-06/29/20    PT Start Time 0845    PT Stop Time 0929    PT Time Calculation (min) 44 min    Equipment Utilized During Treatment Gait belt    Activity Tolerance Patient tolerated treatment well;No increased pain    Behavior During Therapy WFL for tasks assessed/performed           Past Medical History:  Diagnosis Date  . Abnormal Q waves on electrocardiogram   . Anemia   . Aortic atherosclerosis ([place])    "I could possibly have it, my grandmother had it"  . Cancer [facility]) 05/2011   Right upper Lung CA with partial Lobectomy.  . Celiac disease   . Celiac disease   . Dyspnea    with exertion  . Hyperlipidemia   . Hyperlipidemia   . Lung mass   . Meningioma ([place])   . Osteoporosis   . Personal history of chemotherapy   . Personal history of radiation therapy     Past Surgical History:  Procedure Laterality Date  . LUNG LOBECTOMY     right lung  . 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   Subjective Assessment - 05/20/20 0848    Subjective Patient reports no falls or LOB since last session. Grandchild has a graduation this weekend but isn't going.    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 Patient has attended OP PT in the past year for LLE strengthening and balance with positive results, but has not been in attendance recently due to COVID-19 pandemic. PMH includes osteoporosis, anemia, celiac disease, hyperlipidemia, hx of R upper lobe mass with resection 6/13, hx of pneumonia, meningioma, major atherosclerosis. VP shunt has been placed 06/2017.    Currently in Pain? No/denies                   neuro re-ed By support bar:  static stand 30 seconds x2 trials no UE support    2 cone tap: BUE support, cues for L foot to fully tap cone; 12x LLE, 10x RLE  saebo ball transfer in standing for stability with reaching    Single leg stance with BUE support 30 second holds   Seated balloon taps reaching inside/outside BOS x 2 minutes for coordination, spatial awareness and reaction timing.     negotiate perimeter of room looking for cones hidden above, below and on lateral aspects for visual scanning and safe object pick up/negotiation.       Therex:  Nustep Lvl 3 seat position 9 RPM> 60 for cardiovascular challenge and UE/LE coordination 4 minutes   Ambulate >700 ft with rollator and close CGA, patient requires cueing for step length, reduction of freezing when changing rooms or  approaching obstacles.Cues for negotiationof walker required for negotiation of obstacles in pathway  Sit to stands 10x with focus with decreased UE support    Seated: RTB hamstring curl 15x each LE RTB around knees march 15x  RTB around knees: abduction 15x  Adduction ball squeeze 12x   Seated LAQ with green ball adduction between feet 10  ;very challenging for L to mirror RLE    Correction of walker to correct patient height.     Patient's vitals monitored throughout the session.  Pt educated throughout session about proper posture and technique with exercises. Improved exercise technique, movement at target joints, use of target muscles after min to mod verbal,  visual, tactile cues   Patient is able to ambulate with decreased episodes of shuffle steppage. Additional ambulation within gym for scanning and picking up cones was improved with patient being able to walk up to cone with less frequent shuffle/freezing patterns. Occasional rest breaks required due to fatigue/shortness of breath. Patient remains highly motivated and eager for progression of care.   The patient continues to benefit from additional skilled PT services to improve gait pattern, balance and LE strength for improved quality of life                       PT Education - 05/20/20 0849    Education provided Yes    Education Details exercise technique, body mechanics    Person(s) Educated Patient    Methods Explanation;Demonstration;Tactile cues;Verbal cues    Comprehension Verbalized understanding;Returned demonstration;Verbal cues required;Tactile cues required            PT Short Term Goals - 04/19/20 1039      PT SHORT TERM GOAL #1   Title Pt to demonstrate improved hip extension strength AEB ability to perform 5 full height hooklying bridges.    Baseline unable to rise from chair hands free due to hip extension weakness    Time 4    Period Weeks    Status Partially Met    Target Date 05/04/20      PT SHORT TERM GOAL #2   Title Pt to demonstrate SLS balance >3 sec hands free to facilitate weight shift in gait and single limb gross motor coordination.    Baseline Requires modA for weightshift, unable to remain in SLS without BUE 4/4: unable to perform    Period Weeks    Status On-going    Target Date 05/04/20      PT SHORT TERM GOAL #3   Title Pt to demonstrate 5xSTS hands free from single airex pad in <20sec s LOB.    Baseline unable to rise from chair without BUE 4/4: 31.75 ft    Time 4    Period Weeks    Status Partially Met    Target Date 05/04/20             PT Long Term Goals - 04/19/20 1039      PT LONG TERM GOAL #1   Title Patient  will increase FOTO score to equal to or greater than 65% to demonstrate statistically significant improvement in mobility and quality of life.    Baseline 10/20/19: 32% 11/24: 50.7%; 04/06/20    Time 8    Period Weeks    Status New    Target Date 06/01/20      PT LONG TERM GOAL #2   Title Patient will complete five times sit to stand hands free from chair + airex in <  16 seconds.    Baseline Unable to rise from airex without use of hands 4/4: 31.75 seconds with no UE support    Time 8    Period Weeks    Status Partially Met    Target Date 06/01/20      PT LONG TERM GOAL #3   Title Pt to demonstrate tolerance to 6MWT c completion of 86f c 4WW, no LOB, supervision level assistance.    Baseline limited by fatigue, baseline deferred to next session. 4/4: 673 ft with rollator    Time 8    Period Weeks    Status Partially Met    Target Date 06/01/20      PT LONG TERM GOAL #4   Title Pt to tolerate 6MWT c 4WW, supervision level assessment >10031fto facilitate confidence and safety in limited community distance AMB.    Baseline Not performed yet. 4/4: 673 ft with rollator    Time 12    Period Weeks    Status Partially Met    Target Date 06/29/20      PT LONG TERM GOAL #5   Title Pt to perform 5xSTS from chair + airex hands free in <14sec to demonstrate improved motor control in transfers and bilat hip extension strength.    Baseline no baseline yet 4/4: 31.75 seconds with no UE support    Time 12    Period Weeks    Status Partially Met                 Plan - 05/20/20 0915    Clinical Impression Statement Patient is able to ambulate with decreased episodes of shuffle steppage. Additional ambulation within gym for scanning and picking up cones was improved with patient being able to walk up to cone with less frequent shuffle/freezing patterns. Occasional rest breaks required due to fatigue/shortness of breath. Patient remains highly motivated and eager for progression of care.    The patient continues to benefit from additional skilled PT services to improve gait pattern, balance and LE strength for improved quality of life    Personal Factors and Comorbidities Time since onset of injury/illness/exacerbation;Age;Comorbidity 3+;Past/Current Experience;Transportation    Comorbidities osteoporosis, anemia, hx cancer, hyperlipidemia    Examination-Activity Limitations Bathing;Bed Mobility;Dressing;Reach Overhead;Stairs;Stand;Toileting;Locomotion Level;Squat;Transfers;Bend;Hygiene/Grooming;Carry;Sit    Examination-Participation Restrictions Community Activity;Driving;Shop;Yard Work;Cleaning;Meal Prep    Stability/Clinical Decision Making Evolving/Moderate complexity    Rehab Potential Good    PT Frequency 2x / week    PT Duration 8 weeks    PT Treatment/Interventions ADLs/Self Care Home Management;Aquatic Therapy;Cryotherapy;Electrical Stimulation;DME Instruction;Gait training;Stair training;Functional mobility training;Therapeutic activities;Therapeutic exercise;Balance training;Neuromuscular re-education;Patient/family education;Manual techniques;Passive range of motion;Dry needling;Spinal Manipulations;Joint Manipulations;Orthotic Fit/Training;Energy conservation    PT Next Visit Plan Reducing shuffling gait with turns. LLE strengthening.    PT Home Exercise Plan not administered this date    Consulted and Agree with Plan of Care Patient           Patient will benefit from skilled therapeutic intervention in order to improve the following deficits and impairments:  Abnormal gait,Decreased balance,Decreased mobility,Difficulty walking,Hypomobility,Decreased strength,Decreased knowledge of use of DME,Decreased coordination,Postural dysfunction,Impaired flexibility,Improper body mechanics,Impaired perceived functional ability,Decreased activity tolerance  Visit Diagnosis: Unsteadiness on feet  Abnormality of gait and mobility  Muscle weakness  (generalized)     Problem List Patient Active Problem List   Diagnosis Date Noted  . Cognitive deficits   . Labile blood pressure   . Acute blood loss anemia   . Slow transit constipation   . Vascular headache   .  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 Hydrocephalus ([place]) 07/03/2017  . 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 Pelvic fracture ([place]) 04/18/2016  . CD (celiac disease) 08/04/2014  . Cancer of upper lobe of right lung ([place]) 08/04/2014  . Age related osteoporosis 11/26/2013  . Absolute anemia 05/21/2013  . H/O malignant neoplasm 05/21/2013  . HLD (hyperlipidemia) 05/21/2013   Janna Arch, PT, DPT   05/20/2020, 9:30 AM  [place] MAIN [facility] SERVICES [address] [place], [place], [address] Phone: [phone]   Fax:  [phone]  Name: Krista Evans MRN: [id number] Date of Birth: 1945-10-03

## 2020-05-24 ENCOUNTER — Other Ambulatory Visit: Payer: Self-pay

## 2020-05-24 ENCOUNTER — Ambulatory Visit: Payer: PPO

## 2020-05-24 DIAGNOSIS — R269 Unspecified abnormalities of gait and mobility: Secondary | ICD-10-CM

## 2020-05-24 DIAGNOSIS — R2681 Unsteadiness on feet: Secondary | ICD-10-CM

## 2020-05-24 DIAGNOSIS — M6281 Muscle weakness (generalized): Secondary | ICD-10-CM

## 2020-05-24 NOTE — Therapy (Signed)
[place] MAIN [facility] SERVICES [address] [place], [place], [address] Phone: [phone]   Fax:  [phone]  Physical Therapy Treatment  Patient Details  Name: Krista Evans MRN: [id number] Date of Birth: 1945-06-01 Referring Provider (PT): Ramonita Lab   Encounter Date: 05/24/2020   PT End of Session - 05/24/20 0935    Visit Number 49    Number of Visits 65    Date for PT Re-Evaluation 06/29/20    Authorization Type [employer]    Authorization Time Period 02/10/20-04/06/20; 04/06/20-06/29/20    PT Start Time 0930    PT Stop Time 1014    PT Time Calculation (min) 44 min    Equipment Utilized During Treatment Gait belt    Activity Tolerance Patient tolerated treatment well;No increased pain    Behavior During Therapy WFL for tasks assessed/performed           Past Medical History:  Diagnosis Date  . Abnormal Q waves on electrocardiogram   . Anemia   . Aortic atherosclerosis ([place])    "I could possibly have it, my grandmother had it"  . Cancer [facility]) 05/2011   Right upper Lung CA with partial Lobectomy.  . Celiac disease   . Celiac disease   . Dyspnea    with exertion  . Hyperlipidemia   . Hyperlipidemia   . Lung mass   . Meningioma ([place])   . Osteoporosis   . Personal history of chemotherapy   . Personal history of radiation therapy     Past Surgical History:  Procedure Laterality Date  . LUNG LOBECTOMY     right lung  . VENTRICULOPERITONEAL SHUNT Right 07/03/2017   Procedure: SHUNT INSERTION VENTRICULAR-PERITONEAL;  Surgeon: Newman Pies, MD;  Location: [place];  Service: Neurosurgery;  Laterality: Right;    There were no vitals filed for this visit.   Subjective Assessment - 05/24/20 0934    Subjective Patient reports having a good mothers day. No falls or LOB since last session.    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 Patient has  attended OP PT in the past year for LLE strengthening and balance with positive results, but has not been in attendance recently due to COVID-19 pandemic. PMH includes osteoporosis, anemia, celiac disease, hyperlipidemia, hx of R upper lobe mass with resection 6/13, hx of pneumonia, meningioma, major atherosclerosis. VP shunt has been placed 06/2017.    Currently in Pain? No/denies                 neuro re-ed By support bar:  static stand 30 seconds x2 trials no UE support    2 cone tap: BUE support, cues for L foot to fully tap cone; 12x LLE, 10x RLE   Lateral step over orange hurdle 10x each side, heavy BUE support    Single leg stance with BUE support 30 second holds   Seated balloon taps reaching inside/outside BOS x 2 minutes for coordination, spatial awareness and reaction timing.       Therex:  Nustep Lvl 3 seat position 9 RPM> 60 for cardiovascular challenge and UE/LE coordination 4 minutes   Standing with # 3lb ankle weight: CGA for stability  -Hip extension with bilat upper extremity support, cueing for neutral hip alignment, upright posture for optimal muscle recruitment, and sequencing, 10x each LE, x2 sets -Hip abduction with bilat upper extremity support, cueing for neutral foot alignment for correct muscle  activation, 10x each LE; x2 sets  -Hip flexion with bilat upper extremity support, cueing for body mechanics, speed of muscle recruitment for optimal strengthening and stabilization 10x each LE; x 2 trials -Hamstring curl with bilateral upper extremity support, cueing for knee alignment for recruitment of hamstring musculature, 10x each LE   Seated with # 3lb ankle weights  -Seated marches with upright posture, back away from back of chair for abdominal/trunk activation/stabilization, 10x each LE -Seated LAQ with 3 second holds, 10x each LE, cueing for muscle activation and sequencing for neutral alignment -Seated IR/ER with cueing for stabilizing knee placement  with lateral foot movement for optimal muscle recruitment, 10x each LE  Sit to stands 10x with focus with decreased UE support to SUE support         Patient's vitals monitored throughout the session.  Pt educated throughout session about proper posture and technique with exercises. Improved exercise technique, movement at target joints, use of target muscles after min to mod verbal, visual, tactile cues   Patient tolerates increased sets for strengthening interventions indicating improved capacity for functional mobility. She remains highly motivated however does continue to require UE support for strengthening interventions.  Occasional rest breaks required due to fatigue/shortness of breath. LLE spatial awareness is limited with limited coordination that improves with visual and verbal cueing. The patient continues to benefit from additional skilled PT services to improve gait pattern, balance and LE strength for improved quality of life                         PT Education - 05/24/20 0934    Education provided Yes    Education Details exercise technique, body mechanics    Person(s) Educated Patient    Methods Explanation;Demonstration;Tactile cues;Verbal cues    Comprehension Verbalized understanding;Returned demonstration;Verbal cues required;Tactile cues required            PT Short Term Goals - 04/19/20 1039      PT SHORT TERM GOAL #1   Title Pt to demonstrate improved hip extension strength AEB ability to perform 5 full height hooklying bridges.    Baseline unable to rise from chair hands free due to hip extension weakness    Time 4    Period Weeks    Status Partially Met    Target Date 05/04/20      PT SHORT TERM GOAL #2   Title Pt to demonstrate SLS balance >3 sec hands free to facilitate weight shift in gait and single limb gross motor coordination.    Baseline Requires modA for weightshift, unable to remain in SLS without BUE 4/4: unable to perform     Period Weeks    Status On-going    Target Date 05/04/20      PT SHORT TERM GOAL #3   Title Pt to demonstrate 5xSTS hands free from single airex pad in <20sec s LOB.    Baseline unable to rise from chair without BUE 4/4: 31.75 ft    Time 4    Period Weeks    Status Partially Met    Target Date 05/04/20             PT Long Term Goals - 04/19/20 1039      PT LONG TERM GOAL #1   Title Patient will increase FOTO score to equal to or greater than 65% to demonstrate statistically significant improvement in mobility and quality of life.    Baseline 10/20/19: 32% 11/24:  50.7%; 04/06/20    Time 8    Period Weeks    Status New    Target Date 06/01/20      PT LONG TERM GOAL #2   Title Patient will complete five times sit to stand hands free from chair + airex in < 16 seconds.    Baseline Unable to rise from airex without use of hands 4/4: 31.75 seconds with no UE support    Time 8    Period Weeks    Status Partially Met    Target Date 06/01/20      PT LONG TERM GOAL #3   Title Pt to demonstrate tolerance to 6MWT c completion of 881f c 4WW, no LOB, supervision level assistance.    Baseline limited by fatigue, baseline deferred to next session. 4/4: 673 ft with rollator    Time 8    Period Weeks    Status Partially Met    Target Date 06/01/20      PT LONG TERM GOAL #4   Title Pt to tolerate 6MWT c 4WW, supervision level assessment >10062fto facilitate confidence and safety in limited community distance AMB.    Baseline Not performed yet. 4/4: 673 ft with rollator    Time 12    Period Weeks    Status Partially Met    Target Date 06/29/20      PT LONG TERM GOAL #5   Title Pt to perform 5xSTS from chair + airex hands free in <14sec to demonstrate improved motor control in transfers and bilat hip extension strength.    Baseline no baseline yet 4/4: 31.75 seconds with no UE support    Time 12    Period Weeks    Status Partially Met                 Plan - 05/24/20  0951    Clinical Impression Statement Patient tolerates increased sets for strengthening interventions indicating improved capacity for functional mobility. She remains highly motivated however does continue to require UE support for strengthening interventions.  Occasional rest breaks required due to fatigue/shortness of breath. LLE spatial awareness is limited with limited coordination that improves with visual and verbal cueing. The patient continues to benefit from additional skilled PT services to improve gait pattern, balance and LE strength for improved quality of life    Personal Factors and Comorbidities Time since onset of injury/illness/exacerbation;Age;Comorbidity 3+;Past/Current Experience;Transportation    Comorbidities osteoporosis, anemia, hx cancer, hyperlipidemia    Examination-Activity Limitations Bathing;Bed Mobility;Dressing;Reach Overhead;Stairs;Stand;Toileting;Locomotion Level;Squat;Transfers;Bend;Hygiene/Grooming;Carry;Sit    Examination-Participation Restrictions Community Activity;Driving;Shop;Yard Work;Cleaning;Meal Prep    Stability/Clinical Decision Making Evolving/Moderate complexity    Rehab Potential Good    PT Frequency 2x / week    PT Duration 8 weeks    PT Treatment/Interventions ADLs/Self Care Home Management;Aquatic Therapy;Cryotherapy;Electrical Stimulation;DME Instruction;Gait training;Stair training;Functional mobility training;Therapeutic activities;Therapeutic exercise;Balance training;Neuromuscular re-education;Patient/family education;Manual techniques;Passive range of motion;Dry needling;Spinal Manipulations;Joint Manipulations;Orthotic Fit/Training;Energy conservation    PT Next Visit Plan Reducing shuffling gait with turns. LLE strengthening.    PT Home Exercise Plan not administered this date    Consulted and Agree with Plan of Care Patient           Patient will benefit from skilled therapeutic intervention in order to improve the following deficits  and impairments:  Abnormal gait,Decreased balance,Decreased mobility,Difficulty walking,Hypomobility,Decreased strength,Decreased knowledge of use of DME,Decreased coordination,Postural dysfunction,Impaired flexibility,Improper body mechanics,Impaired perceived functional ability,Decreased activity tolerance  Visit Diagnosis: Unsteadiness on feet  Abnormality of gait and mobility  Muscle  weakness (generalized)     Problem List Patient Active Problem List   Diagnosis Date Noted  . Cognitive deficits   . Labile blood pressure   . Acute blood loss anemia   . Slow transit constipation   . Vascular headache   . 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 Hydrocephalus ([place]) 07/03/2017  . 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 Pelvic fracture ([place]) 04/18/2016  . CD (celiac disease) 08/04/2014  . Cancer of upper lobe of right lung ([place]) 08/04/2014  . Age related osteoporosis 11/26/2013  . Absolute anemia 05/21/2013  . H/O malignant neoplasm 05/21/2013  . HLD (hyperlipidemia) 05/21/2013   Janna Arch, PT, DPT   05/24/2020, 10:15 AM  [place] MAIN [facility] SERVICES [address] [place], [place], [address] Phone: [phone]   Fax:  [phone]  Name: Krista Evans MRN: [id number] Date of Birth: [birth year]

## 2020-05-26 ENCOUNTER — Ambulatory Visit: Payer: PPO

## 2020-05-26 ENCOUNTER — Other Ambulatory Visit: Payer: Self-pay

## 2020-05-26 DIAGNOSIS — R269 Unspecified abnormalities of gait and mobility: Secondary | ICD-10-CM

## 2020-05-26 DIAGNOSIS — R2681 Unsteadiness on feet: Secondary | ICD-10-CM | POA: Diagnosis not present

## 2020-05-26 DIAGNOSIS — M6281 Muscle weakness (generalized): Secondary | ICD-10-CM

## 2020-05-26 NOTE — Therapy (Signed)
[place] MAIN [facility] SERVICES [address] [place], [place], [address] Phone: [phone]   Fax:  [phone]  Physical Therapy Treatment Physical Therapy Progress Note   Dates of reporting period  04/19/20  to   05/26/20   Patient Details  Name: Krista Evans MRN: [id number] Date of Birth: [birth year] Referring Provider (PT): Ramonita Lab   Encounter Date: 05/26/2020   PT End of Session - 05/26/20 0951    Visit Number 50    Number of Visits 65    Date for PT Re-Evaluation 06/29/20    Authorization Type [employer]; next session 1/10 PN 05/26/20    Authorization Time Period 02/10/20-04/06/20; 04/06/20-06/29/20    PT Start Time 0930    PT Stop Time 1014    PT Time Calculation (min) 44 min    Equipment Utilized During Treatment Gait belt    Activity Tolerance Patient tolerated treatment well;No increased pain    Behavior During Therapy WFL for tasks assessed/performed           Past Medical History:  Diagnosis Date  . Abnormal Q waves on electrocardiogram   . Anemia   . Aortic atherosclerosis ([place])    "I could possibly have it, my grandmother had it"  . Cancer [facility]) 05/2011   Right upper Lung CA with partial Lobectomy.  . Celiac disease   . Celiac disease   . Dyspnea    with exertion  . Hyperlipidemia   . Hyperlipidemia   . Lung mass   . Meningioma ([place])   . Osteoporosis   . Personal history of chemotherapy   . Personal history of radiation therapy     Past Surgical History:  Procedure Laterality Date  . LUNG LOBECTOMY     right lung  . 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   Subjective Assessment - 05/26/20 0932    Subjective Patient reports compliance with HEP. No falls or LOB since last session. Her daughter in law  and grandson came by.    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 Patient has attended OP PT in the past year for LLE strengthening and balance with positive results, but has not been in attendance recently due to COVID-19 pandemic. PMH includes osteoporosis, anemia, celiac disease, hyperlipidemia, hx of R upper lobe mass with resection 6/13, hx of pneumonia, meningioma, major atherosclerosis. VP shunt has been placed 06/2017.    Currently in Pain? No/denies                Goals  Hip extension : perform Hooklying bridge SLS : able to lift 1-2 seconds without UE support  5x STS (with airex pad in chair): 17.23 seconds with BUE support, able to perform two consecutive not timed sit to stands without UE support.  FOTO: 45%  6 min walk test: 665 ft ; patient very challenged with the turn portion of test with 10-15 seconds per turn.    Treatment:  By support bar.  -single arm support single arm march with ball in right hand to prevent from holding onto bar x10x each LE   seated: RTB around bilateral LE, LAQ with cues for keeping feet abducted and in neutral alignment 12x each LE RTB around bilateral LE: alternating ER/IER 12x  each LE RTB abduction 15x  RTB hamstring curl 10x each LE Seated sticky note taps for coordination/spatial awareness and sequencing 30 seconds x 2 trials    Patient's condition has the potential to improve in response to therapy. Maximum improvement is yet to be obtained. The anticipated improvement is attainable and reasonable in a generally predictable time.  Patient reports she wants to work on walking without the walker.   Patient's sit to stands are improving as well as ability to lift leg without UE support however her six minute walk test did decrease slightly. Patient is very fearful of picking up limbs without UE support and requires encouragement and use of placing objects in hands  for decreased use of hands. Patient's condition has the potential to improve in response to therapy. Maximum improvement is yet to be obtained. The anticipated improvement is attainable and reasonable in a generally predictable time. The patient continues to benefit from additional skilled PT services to improve gait pattern, balance and LE strength for improved quality of life                 PT Education - 05/26/20 1028    Education provided Yes    Education Details exercise technique, body mechanics, goals    Person(s) Educated Patient    Methods Explanation;Demonstration;Tactile cues;Verbal cues    Comprehension Verbalized understanding;Returned demonstration;Verbal cues required;Tactile cues required            PT Short Term Goals - 05/26/20 1004      PT SHORT TERM GOAL #1   Title Pt to demonstrate improved hip extension strength AEB ability to perform 5 full height hooklying bridges.    Baseline unable to rise from chair hands free due to hip extension weakness    Time 4    Period Weeks    Status Partially Met    Target Date 05/04/20      PT SHORT TERM GOAL #2   Title Pt to demonstrate SLS balance >3 sec hands free to facilitate weight shift in gait and single limb gross motor coordination.    Baseline Requires modA for weightshift, unable to remain in SLS without BUE 4/4: unable to perform 5/11: 1-2 seconds    Period Weeks    Status Partially Met    Target Date 05/04/20      PT SHORT TERM GOAL #3   Title Pt to demonstrate 5xSTS hands free from single airex pad in <20sec s LOB.    Baseline unable to rise from chair without BUE 4/4: 31.75 ft    Time 4    Period Weeks    Status Partially Met    Target Date 05/04/20             PT Long Term Goals - 05/26/20 1002      PT LONG TERM GOAL #1   Title Patient will increase FOTO score to equal to or greater than 65% to demonstrate statistically significant improvement in mobility and quality of life.     Baseline 10/20/19: 32% 11/24: 50.7%; 04/06/20 5/11: 45%    Time 8    Period Weeks    Status On-going    Target Date 06/29/20      PT LONG TERM GOAL #2   Title Patient will complete five times sit to stand hands free from chair + airex in < 16 seconds.    Baseline Unable to rise from airex without use of hands 4/4: 31.75 seconds with no UE support 5/11:  17.23 seconds with BUE support    Time 8    Period Weeks    Status Partially Met    Target Date 06/29/20      PT LONG TERM GOAL #3   Title Pt to demonstrate tolerance to 6MWT c completion of 868f c 4WW, no LOB, supervision level assistance.    Baseline limited by fatigue, baseline deferred to next session. 4/4: 673 ft with rollator 5/11: 665 ft    Time 8    Period Weeks    Status Partially Met    Target Date 06/29/20      PT LONG TERM GOAL #4   Title Pt to tolerate 6MWT c 4WW, supervision level assessment >10037fto facilitate confidence and safety in limited community distance AMB.    Baseline Not performed yet. 4/4: 673 ft with rollator 5/11: 665 ft with rollator    Time 12    Period Weeks    Status Partially Met    Target Date 06/29/20      PT LONG TERM GOAL #5   Title Pt to perform 5xSTS from chair + airex hands free in <14sec to demonstrate improved motor control in transfers and bilat hip extension strength.    Baseline no baseline yet 4/4: 31.75 seconds with no UE support 5/11: 17.23 seconds with BUE support    Time 12    Period Weeks    Status Partially Met    Target Date 06/29/20                 Plan - 05/26/20 1005    Clinical Impression Statement Patient's sit to stands are improving as well as ability to lift leg without UE support however her six minute walk test did decrease slightly. Patient is very fearful of picking up limbs without UE support and requires encouragement and use of placing objects in hands for decreased use of hands. Patient's condition has the potential to improve in response to therapy.  Maximum improvement is yet to be obtained. The anticipated improvement is attainable and reasonable in a generally predictable time. The patient continues to benefit from additional skilled PT services to improve gait pattern, balance and LE strength for improved quality of life    Personal Factors and Comorbidities Time since onset of injury/illness/exacerbation;Age;Comorbidity 3+;Past/Current Experience;Transportation    Comorbidities osteoporosis, anemia, hx cancer, hyperlipidemia    Examination-Activity Limitations Bathing;Bed Mobility;Dressing;Reach Overhead;Stairs;Stand;Toileting;Locomotion Level;Squat;Transfers;Bend;Hygiene/Grooming;Carry;Sit    Examination-Participation Restrictions Community Activity;Driving;Shop;Yard Work;Cleaning;Meal Prep    Stability/Clinical Decision Making Evolving/Moderate complexity    Rehab Potential Good    PT Frequency 2x / week    PT Duration 8 weeks    PT Treatment/Interventions ADLs/Self Care Home Management;Aquatic Therapy;Cryotherapy;Electrical Stimulation;DME Instruction;Gait training;Stair training;Functional mobility training;Therapeutic activities;Therapeutic exercise;Balance training;Neuromuscular re-education;Patient/family education;Manual techniques;Passive range of motion;Dry needling;Spinal Manipulations;Joint Manipulations;Orthotic Fit/Training;Energy conservation    PT Next Visit Plan Reducing shuffling gait with turns. LLE strengthening.    PT Home Exercise Plan not administered this date    Consulted and Agree with Plan of Care Patient           Patient will benefit from skilled therapeutic intervention in order to improve the following deficits and impairments:  Abnormal gait,Decreased balance,Decreased mobility,Difficulty walking,Hypomobility,Decreased strength,Decreased knowledge of use of DME,Decreased coordination,Postural dysfunction,Impaired flexibility,Improper body mechanics,Impaired perceived functional ability,Decreased activity  tolerance  Visit Diagnosis: Unsteadiness on feet  Abnormality of gait and mobility  Muscle weakness (generalized)     Problem List Patient Active Problem List   Diagnosis Date Noted  . Cognitive  deficits   . Labile blood pressure   . Acute blood loss anemia   . Slow transit constipation   . Vascular headache   . 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 Hydrocephalus ([place]) 07/03/2017  . 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 Pelvic fracture ([place]) 04/18/2016  . CD (celiac disease) 08/04/2014  . Cancer of upper lobe of right lung ([place]) 08/04/2014  . Age related osteoporosis 11/26/2013  . Absolute anemia 05/21/2013  . H/O malignant neoplasm 05/21/2013  . HLD (hyperlipidemia) 05/21/2013   Janna Arch, PT, DPT   05/26/2020, 10:29 AM  [place] MAIN [facility] SERVICES [address] [place], [place], [address] Phone: [phone]   Fax:  [phone]  Name: REILYN NELSON MRN: [id number] Date of Birth: 01/23/1945

## 2020-05-31 ENCOUNTER — Ambulatory Visit: Payer: PPO

## 2020-06-02 ENCOUNTER — Ambulatory Visit: Payer: PPO

## 2020-06-02 ENCOUNTER — Other Ambulatory Visit: Payer: Self-pay

## 2020-06-02 DIAGNOSIS — R269 Unspecified abnormalities of gait and mobility: Secondary | ICD-10-CM

## 2020-06-02 DIAGNOSIS — R2681 Unsteadiness on feet: Secondary | ICD-10-CM | POA: Diagnosis not present

## 2020-06-02 DIAGNOSIS — M6281 Muscle weakness (generalized): Secondary | ICD-10-CM

## 2020-06-02 NOTE — Therapy (Signed)
[place] MAIN [facility] SERVICES [address] [place], [place], [address] Phone: [phone]   Fax:  [phone]  Physical Therapy Treatment  Patient Details  Name: Krista Evans MRN: [id number] Date of Birth: 24-Jan-1945 Referring Provider (PT): Ramonita Lab   Encounter Date: 06/02/2020   PT End of Session - 06/02/20 1252    Visit Number 51    Number of Visits 26    Date for PT Re-Evaluation 06/29/20    Authorization Type [employer];  1/10 PN 05/26/20    Authorization Time Period 02/10/20-04/06/20; 04/06/20-06/29/20    PT Start Time 0930    PT Stop Time 1013    PT Time Calculation (min) 43 min    Equipment Utilized During Treatment Gait belt    Activity Tolerance Patient tolerated treatment well;No increased pain    Behavior During Therapy WFL for tasks assessed/performed           Past Medical History:  Diagnosis Date  . Abnormal Q waves on electrocardiogram   . Anemia   . Aortic atherosclerosis ([place])    "I could possibly have it, my grandmother had it"  . Cancer [facility]) 05/2011   Right upper Lung CA with partial Lobectomy.  . Celiac disease   . Celiac disease   . Dyspnea    with exertion  . Hyperlipidemia   . Hyperlipidemia   . Lung mass   . Meningioma ([place])   . Osteoporosis   . Personal history of chemotherapy   . Personal history of radiation therapy     Past Surgical History:  Procedure Laterality Date  . LUNG LOBECTOMY     right lung  . VENTRICULOPERITONEAL SHUNT Right 07/03/2017   Procedure: SHUNT INSERTION VENTRICULAR-PERITONEAL;  Surgeon: Newman Pies, MD;  Location: [place];  Service: Neurosurgery;  Laterality: Right;    There were no vitals filed for this visit.   Subjective Assessment - 06/02/20 1251    Subjective Patient missed last session. Has been compliant with HEP, no falls or LOB since last session.    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 Patient has attended OP PT in the past year for LLE strengthening and balance with positive results, but has not been in attendance recently due to COVID-19 pandemic. PMH includes osteoporosis, anemia, celiac disease, hyperlipidemia, hx of R upper lobe mass with resection 6/13, hx of pneumonia, meningioma, major atherosclerosis. VP shunt has been placed 06/2017.    Currently in Pain? No/denies              ambulate across stable and unstable surface outside. Negotiating changing surfaces from grass to sidewalk, across brick with turns and obstacles in pathway without LOB. X 20 minutes    incline/decline training with RW and cues for increasing step length, decreasing shuffle steppage, widening BOS, and increasing velocity of movement. x6 minutes  Seated: LAQ 10x each LE, hold 3 seconds each  March with arms crossed and upright position 10x each LE Heel toe raises, very challenging for LLE.  Sit to stands from a variety of surfaces including benches, rocking chair, and chair with focus on decreasing need for UE support. x6       Pt educated throughout session about proper posture and technique with exercises. Improved exercise technique, movement at target joints, use of target muscles after min to mod verbal, visual, tactile cues.   Patient is very challenged with incline/decline negotiation with increased  episodes of freezing and shuffle steppage. Changing surfaces continue to additionally result in freezing of patient but did improve about 25% of the time this session. generally predictable time. The patient continues to benefit from additional skilled PT services to improve gait pattern, balance and LE strength for improved quality of life                  PT Education - 06/02/20 1252    Education provided Yes    Education Details exercise technique, body mechanics,    Person(s) Educated Patient    Methods Explanation;Demonstration;Tactile  cues;Verbal cues    Comprehension Verbalized understanding;Returned demonstration;Verbal cues required;Tactile cues required            PT Short Term Goals - 05/26/20 1004      PT SHORT TERM GOAL #1   Title Pt to demonstrate improved hip extension strength AEB ability to perform 5 full height hooklying bridges.    Baseline unable to rise from chair hands free due to hip extension weakness    Time 4    Period Weeks    Status Partially Met    Target Date 05/04/20      PT SHORT TERM GOAL #2   Title Pt to demonstrate SLS balance >3 sec hands free to facilitate weight shift in gait and single limb gross motor coordination.    Baseline Requires modA for weightshift, unable to remain in SLS without BUE 4/4: unable to perform 5/11: 1-2 seconds    Period Weeks    Status Partially Met    Target Date 05/04/20      PT SHORT TERM GOAL #3   Title Pt to demonstrate 5xSTS hands free from single airex pad in <20sec s LOB.    Baseline unable to rise from chair without BUE 4/4: 31.75 ft    Time 4    Period Weeks    Status Partially Met    Target Date 05/04/20             PT Long Term Goals - 05/26/20 1002      PT LONG TERM GOAL #1   Title Patient will increase FOTO score to equal to or greater than 65% to demonstrate statistically significant improvement in mobility and quality of life.    Baseline 10/20/19: 32% 11/24: 50.7%; 04/06/20 5/11: 45%    Time 8    Period Weeks    Status On-going    Target Date 06/29/20      PT LONG TERM GOAL #2   Title Patient will complete five times sit to stand hands free from chair + airex in < 16 seconds.    Baseline Unable to rise from airex without use of hands 4/4: 31.75 seconds with no UE support 5/11: 17.23 seconds with BUE support    Time 8    Period Weeks    Status Partially Met    Target Date 06/29/20      PT LONG TERM GOAL #3   Title Pt to demonstrate tolerance to 6MWT c completion of 874f c 4WW, no LOB, supervision level assistance.     Baseline limited by fatigue, baseline deferred to next session. 4/4: 673 ft with rollator 5/11: 665 ft    Time 8    Period Weeks    Status Partially Met    Target Date 06/29/20      PT LONG TERM GOAL #4   Title Pt to tolerate 6MWT c 4WW, supervision level assessment >10052fto facilitate confidence  and safety in limited community distance AMB.    Baseline Not performed yet. 4/4: 673 ft with rollator 5/11: 665 ft with rollator    Time 12    Period Weeks    Status Partially Met    Target Date 06/29/20      PT LONG TERM GOAL #5   Title Pt to perform 5xSTS from chair + airex hands free in <14sec to demonstrate improved motor control in transfers and bilat hip extension strength.    Baseline no baseline yet 4/4: 31.75 seconds with no UE support 5/11: 17.23 seconds with BUE support    Time 12    Period Weeks    Status Partially Met    Target Date 06/29/20                 Plan - 06/02/20 1425    Clinical Impression Statement Patient is very challenged with incline/decline negotiation with increased episodes of freezing and shuffle steppage. Changing surfaces continue to additionally result in freezing of patient but did improve about 25% of the time this session. generally predictable time. The patient continues to benefit from additional skilled PT services to improve gait pattern, balance and LE strength for improved quality of life    Personal Factors and Comorbidities Time since onset of injury/illness/exacerbation;Age;Comorbidity 3+;Past/Current Experience;Transportation    Comorbidities osteoporosis, anemia, hx cancer, hyperlipidemia    Examination-Activity Limitations Bathing;Bed Mobility;Dressing;Reach Overhead;Stairs;Stand;Toileting;Locomotion Level;Squat;Transfers;Bend;Hygiene/Grooming;Carry;Sit    Examination-Participation Restrictions Community Activity;Driving;Shop;Yard Work;Cleaning;Meal Prep    Stability/Clinical Decision Making Evolving/Moderate complexity    Rehab  Potential Good    PT Frequency 2x / week    PT Duration 8 weeks    PT Treatment/Interventions ADLs/Self Care Home Management;Aquatic Therapy;Cryotherapy;Electrical Stimulation;DME Instruction;Gait training;Stair training;Functional mobility training;Therapeutic activities;Therapeutic exercise;Balance training;Neuromuscular re-education;Patient/family education;Manual techniques;Passive range of motion;Dry needling;Spinal Manipulations;Joint Manipulations;Orthotic Fit/Training;Energy conservation    PT Next Visit Plan Reducing shuffling gait with turns. LLE strengthening.    PT Home Exercise Plan not administered this date    Consulted and Agree with Plan of Care Patient           Patient will benefit from skilled therapeutic intervention in order to improve the following deficits and impairments:  Abnormal gait,Decreased balance,Decreased mobility,Difficulty walking,Hypomobility,Decreased strength,Decreased knowledge of use of DME,Decreased coordination,Postural dysfunction,Impaired flexibility,Improper body mechanics,Impaired perceived functional ability,Decreased activity tolerance  Visit Diagnosis: Unsteadiness on feet  Abnormality of gait and mobility  Muscle weakness (generalized)     Problem List Patient Active Problem List   Diagnosis Date Noted  . Cognitive deficits   . Labile blood pressure   . Acute blood loss anemia   . Slow transit constipation   . Vascular headache   . 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 Hydrocephalus ([place]) 07/03/2017  . 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 Pelvic fracture ([place]) 04/18/2016  . CD (celiac disease) 08/04/2014  . Cancer of upper lobe of right lung ([place]) 08/04/2014  . Age related osteoporosis 11/26/2013  . Absolute anemia 05/21/2013  . H/O malignant neoplasm 05/21/2013  . HLD (hyperlipidemia) 05/21/2013   Janna Arch, PT, DPT   06/02/2020, 2:26 PM  [place] MAIN [facility]  SERVICES [address] [place], [place], [address] Phone: [phone]   Fax:  [phone]  Name: Krista Evans MRN: [id number] Date of Birth: [birth year]

## 2020-06-07 ENCOUNTER — Other Ambulatory Visit: Payer: Self-pay

## 2020-06-07 ENCOUNTER — Ambulatory Visit: Payer: PPO

## 2020-06-07 DIAGNOSIS — R2681 Unsteadiness on feet: Secondary | ICD-10-CM | POA: Diagnosis not present

## 2020-06-07 DIAGNOSIS — M6281 Muscle weakness (generalized): Secondary | ICD-10-CM

## 2020-06-07 DIAGNOSIS — R269 Unspecified abnormalities of gait and mobility: Secondary | ICD-10-CM

## 2020-06-07 NOTE — Therapy (Signed)
[place] MAIN [facility] SERVICES [address] [place], [place], [address] Phone: [phone]   Fax:  [phone]  Physical Therapy Treatment  Patient Details  Name: Krista Evans MRN: [id number] Date of Birth: 1945/11/27 Referring Provider (PT): Ramonita Lab   Encounter Date: 06/07/2020   PT End of Session - 06/07/20 0922    Visit Number 24    Number of Visits 35    Date for PT Re-Evaluation 06/29/20    Authorization Type [employer];  2/10 PN 05/26/20    Authorization Time Period 02/10/20-04/06/20; 04/06/20-06/29/20    PT Start Time 0929    PT Stop Time 1013    PT Time Calculation (min) 44 min    Equipment Utilized During Treatment Gait belt    Activity Tolerance Patient tolerated treatment well;No increased pain    Behavior During Therapy WFL for tasks assessed/performed           Past Medical History:  Diagnosis Date  . Abnormal Q waves on electrocardiogram   . Anemia   . Aortic atherosclerosis ([place])    "I could possibly have it, my grandmother had it"  . Cancer [facility]) 05/2011   Right upper Lung CA with partial Lobectomy.  . Celiac disease   . Celiac disease   . Dyspnea    with exertion  . Hyperlipidemia   . Hyperlipidemia   . Lung mass   . Meningioma ([place])   . Osteoporosis   . Personal history of chemotherapy   . Personal history of radiation therapy     Past Surgical History:  Procedure Laterality Date  . LUNG LOBECTOMY     right lung  . 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   Subjective Assessment - 06/07/20 0931    Subjective Patient reports she had a good weekend. No falls or LOB since last session.    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 Patient has attended OP PT in the past year for LLE strengthening and balance with positive results, but has not been in attendance recently due to COVID-19 pandemic. PMH includes osteoporosis, anemia, celiac disease, hyperlipidemia, hx of R upper lobe mass with resection 6/13, hx of pneumonia, meningioma, major atherosclerosis. VP shunt has been placed 06/2017.    Currently in Pain? No/denies                  neuro re-ed By support bar: Standing with CGA next to support surface:  Airex pad: static stand 30 seconds x 2 trials, noticeable trembling of ankles/LE's with fatigue and challenge to maintain stability Airex pad: horizontal head turns 30 seconds scanning room 10x ; cueing for arc of motion  Airex pad: vertical head turns 30 seconds, cueing for arc of motion, noticeable sway with upward gaze increasing demand on ankle righting reaction musculature  Lateral step over orange hurdle 10x each side, heavy BUE support    Forward step over and back orange hurdle 10x each LE; heavy BUE support   Seated soccer ball kicks for coordination, spatial awareness 10x each LE        Therex:  Nustep Lvl 3 seat position 9 RPM> 60 for cardiovascular challenge and UE/LE coordination 4 minutes   Green step on two platforms (purple ones);  toe taps 15x each LE, heavy BUE support  Standing with # 3lb ankle weight: CGA for stability   -Hip extension with bilat upper extremity support, cueing for neutral hip alignment, upright posture for optimal muscle recruitment, and sequencing, 10x each LE, x2 sets -Hip abduction with bilat upper extremity support, cueing for neutral foot alignment for correct muscle activation, 10x each LE; x2 sets  -Hip flexion with bilat upper extremity support, cueing for body mechanics, speed of muscle recruitment for optimal strengthening and stabilization 10x each LE; x 2 trials     Seated with # 3lb ankle weights  -Seated marches with upright posture, back  away from back of chair for abdominal/trunk activation/stabilization, 10x each LE -Seated LAQ with 3 second holds, 10x each LE, cueing for muscle activation and sequencing for neutral alignment -Seated IR/ER with cueing for stabilizing knee placement with lateral foot movement for optimal muscle recruitment, 10x each LE  -heel raise 15x  Seated:  Sit to stands 10x with focus with decreased UE support to SUE support; cues for nose over toes.   RTB row 10x ; 2 sets          Patient's vitals monitored throughout the session.  Pt educated throughout session about proper posture and technique with exercises. Improved exercise technique, movement at target joints, use of target muscles after min to mod verbal, visual, tactile cues   Patient is challenged stable and unstable surfaces with limited ankle righting reactions of LLE; she is more challenged with vertical head movements than lateral.  She is fearful of letting go of UE support due to fear of falling. Object negotiation will continue to be an area of improvement and focus throughout session.  The patient continues to benefit from additional skilled PT services to improve gait pattern, balance and LE strength for improved quality of life                        PT Education - 06/07/20 0918    Education provided Yes    Education Details exercise technique, body mechanics    Person(s) Educated Patient    Methods Explanation;Demonstration;Tactile cues;Verbal cues    Comprehension Verbalized understanding;Returned demonstration;Verbal cues required;Tactile cues required            PT Short Term Goals - 05/26/20 1004      PT SHORT TERM GOAL #1   Title Pt to demonstrate improved hip extension strength AEB ability to perform 5 full height hooklying bridges.    Baseline unable to rise from chair hands free due to hip extension weakness    Time 4    Period Weeks    Status Partially Met    Target Date 05/04/20      PT  SHORT TERM GOAL #2   Title Pt to demonstrate SLS balance >3 sec hands free to facilitate weight shift in gait and single limb gross motor coordination.    Baseline Requires modA for weightshift, unable to remain in SLS without BUE 4/4: unable to perform 5/11: 1-2 seconds    Period Weeks    Status Partially Met    Target Date 05/04/20      PT SHORT TERM GOAL #3   Title Pt to demonstrate 5xSTS hands free from single airex pad in <20sec s LOB.    Baseline unable to rise from chair without BUE 4/4: 31.75 ft    Time 4    Period Weeks    Status Partially Met  Target Date 05/04/20             PT Long Term Goals - 05/26/20 1002      PT LONG TERM GOAL #1   Title Patient will increase FOTO score to equal to or greater than 65% to demonstrate statistically significant improvement in mobility and quality of life.    Baseline 10/20/19: 32% 11/24: 50.7%; 04/06/20 5/11: 45%    Time 8    Period Weeks    Status On-going    Target Date 06/29/20      PT LONG TERM GOAL #2   Title Patient will complete five times sit to stand hands free from chair + airex in < 16 seconds.    Baseline Unable to rise from airex without use of hands 4/4: 31.75 seconds with no UE support 5/11: 17.23 seconds with BUE support    Time 8    Period Weeks    Status Partially Met    Target Date 06/29/20      PT LONG TERM GOAL #3   Title Pt to demonstrate tolerance to 6MWT c completion of 859f c 4WW, no LOB, supervision level assistance.    Baseline limited by fatigue, baseline deferred to next session. 4/4: 673 ft with rollator 5/11: 665 ft    Time 8    Period Weeks    Status Partially Met    Target Date 06/29/20      PT LONG TERM GOAL #4   Title Pt to tolerate 6MWT c 4WW, supervision level assessment >10094fto facilitate confidence and safety in limited community distance AMB.    Baseline Not performed yet. 4/4: 673 ft with rollator 5/11: 665 ft with rollator    Time 12    Period Weeks    Status Partially Met     Target Date 06/29/20      PT LONG TERM GOAL #5   Title Pt to perform 5xSTS from chair + airex hands free in <14sec to demonstrate improved motor control in transfers and bilat hip extension strength.    Baseline no baseline yet 4/4: 31.75 seconds with no UE support 5/11: 17.23 seconds with BUE support    Time 12    Period Weeks    Status Partially Met    Target Date 06/29/20                 Plan - 06/07/20 0948    Clinical Impression Statement Patient is challenged stable and unstable surfaces with limited ankle righting reactions of LLE; she is more challenged with vertical head movements than lateral.  She is fearful of letting go of UE support due to fear of falling. Object negotiation will continue to be an area of improvement and focus throughout session.  The patient continues to benefit from additional skilled PT services to improve gait pattern, balance and LE strength for improved quality of life    Personal Factors and Comorbidities Time since onset of injury/illness/exacerbation;Age;Comorbidity 3+;Past/Current Experience;Transportation    Comorbidities osteoporosis, anemia, hx cancer, hyperlipidemia    Examination-Activity Limitations Bathing;Bed Mobility;Dressing;Reach Overhead;Stairs;Stand;Toileting;Locomotion Level;Squat;Transfers;Bend;Hygiene/Grooming;Carry;Sit    Examination-Participation Restrictions Community Activity;Driving;Shop;Yard Work;Cleaning;Meal Prep    Stability/Clinical Decision Making Evolving/Moderate complexity    Rehab Potential Good    PT Frequency 2x / week    PT Duration 8 weeks    PT Treatment/Interventions ADLs/Self Care Home Management;Aquatic Therapy;Cryotherapy;Electrical Stimulation;DME Instruction;Gait training;Stair training;Functional mobility training;Therapeutic activities;Therapeutic exercise;Balance training;Neuromuscular re-education;Patient/family education;Manual techniques;Passive range of motion;Dry needling;Spinal  Manipulations;Joint Manipulations;Orthotic Fit/Training;Energy conservation  PT Next Visit Plan Reducing shuffling gait with turns. LLE strengthening.    PT Home Exercise Plan not administered this date    Consulted and Agree with Plan of Care Patient           Patient will benefit from skilled therapeutic intervention in order to improve the following deficits and impairments:  Abnormal gait,Decreased balance,Decreased mobility,Difficulty walking,Hypomobility,Decreased strength,Decreased knowledge of use of DME,Decreased coordination,Postural dysfunction,Impaired flexibility,Improper body mechanics,Impaired perceived functional ability,Decreased activity tolerance  Visit Diagnosis: Unsteadiness on feet  Abnormality of gait and mobility  Muscle weakness (generalized)     Problem List Patient Active Problem List   Diagnosis Date Noted  . Cognitive deficits   . Labile blood pressure   . Acute blood loss anemia   . Slow transit constipation   . Vascular headache   . 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 Hydrocephalus ([place]) 07/03/2017  . 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 Pelvic fracture ([place]) 04/18/2016  . CD (celiac disease) 08/04/2014  . Cancer of upper lobe of right lung ([place]) 08/04/2014  . Age related osteoporosis 11/26/2013  . Absolute anemia 05/21/2013  . H/O malignant neoplasm 05/21/2013  . HLD (hyperlipidemia) 05/21/2013   Janna Arch, PT, DPT   06/07/2020, 10:14 AM  [place] MAIN [facility] SERVICES [address] [place], [place], [address] Phone: [phone]   Fax:  [phone]  Name: SHINE SCROGHAM MRN: [id number] Date of Birth: 01/08/1946

## 2020-06-10 ENCOUNTER — Ambulatory Visit: Payer: PPO

## 2020-06-10 ENCOUNTER — Other Ambulatory Visit: Payer: Self-pay

## 2020-06-10 DIAGNOSIS — R269 Unspecified abnormalities of gait and mobility: Secondary | ICD-10-CM

## 2020-06-10 DIAGNOSIS — M6281 Muscle weakness (generalized): Secondary | ICD-10-CM

## 2020-06-10 DIAGNOSIS — R2681 Unsteadiness on feet: Secondary | ICD-10-CM

## 2020-06-10 NOTE — Therapy (Signed)
[place] MAIN [facility] SERVICES [address] [place], [place], [address] Phone: [phone]   Fax:  [phone]  Physical Therapy Treatment  Patient Details  Name: Krista Evans MRN: [id number] Date of Birth: 05-09-1945 Referring Provider (PT): Ramonita Lab   Encounter Date: 06/10/2020   PT End of Session - 06/10/20 0849    Visit Number 12    Number of Visits 65    Date for PT Re-Evaluation 06/29/20    Authorization Type [employer];  3/10 PN 05/26/20    Authorization Time Period 02/10/20-04/06/20; 04/06/20-06/29/20    PT Start Time 0845    PT Stop Time 0929    PT Time Calculation (min) 44 min    Equipment Utilized During Treatment Gait belt    Activity Tolerance Patient tolerated treatment well;No increased pain    Behavior During Therapy WFL for tasks assessed/performed           Past Medical History:  Diagnosis Date  . Abnormal Q waves on electrocardiogram   . Anemia   . Aortic atherosclerosis ([place])    "I could possibly have it, my grandmother had it"  . Cancer [facility]) 05/2011   Right upper Lung CA with partial Lobectomy.  . Celiac disease   . Celiac disease   . Dyspnea    with exertion  . Hyperlipidemia   . Hyperlipidemia   . Lung mass   . Meningioma ([place])   . Osteoporosis   . Personal history of chemotherapy   . Personal history of radiation therapy     Past Surgical History:  Procedure Laterality Date  . LUNG LOBECTOMY     right lung  . VENTRICULOPERITONEAL SHUNT Right 07/03/2017   Procedure: SHUNT INSERTION VENTRICULAR-PERITONEAL;  Surgeon: Newman Pies, MD;  Location: [place];  Service: Neurosurgery;  Laterality: Right;    There were no vitals filed for this visit.   Subjective Assessment - 06/10/20 0848    Subjective Patient reports compliance with HEP. No plans for the holiday this weekend.    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 Patient has attended OP PT in the past year for LLE strengthening and balance with positive results, but has not been in attendance recently due to COVID-19 pandemic. PMH includes osteoporosis, anemia, celiac disease, hyperlipidemia, hx of R upper lobe mass with resection 6/13, hx of pneumonia, meningioma, major atherosclerosis. VP shunt has been placed 06/2017.    Currently in Pain? No/denies                        neuro re-ed In hallway: Horizontal head turns reading cards while walking with rollator: cues for decreasing shuffle step, upright posture, and scanning of environment:  -scanning for letters 2x 86 ft, scanning for numbers 2x 86 ft; episodic freezing with fatigue       Therex:  Nustep Lvl 3 seat position 9; L arm position 10  RPM> 60 for cardiovascular challenge and UE/LE coordination 4 minutes   Green step on two platforms (purple ones); toe taps 15x each LE, heavy BUE support   Standing with # 4lb ankle weight: CGA for stability -Hip extension with bilat upper extremity support, cueing for neutral hip alignment, upright posture for optimal muscle recruitment, and sequencing, 10x each LE, x2 sets -Hip abduction with bilat upper extremity support, cueing for neutral foot alignment for correct muscle activation, 10x each LE;  -Hip flexion  with bilat upper extremity support, cueing for body mechanics, speed of muscle recruitment for optimal strengthening and stabilization 12x each LE; x 2 trials     Seated with # 4lb ankle weights  -Seated marches with upright posture, back away from back of chair for abdominal/trunk activation/stabilization, 10x each LE -Seated LAQ with 3 second holds, 10x each LE, cueing for muscle activation and sequencing for neutral alignment -Seated IR/ER with cueing for stabilizing knee placement with lateral foot movement for optimal muscle recruitment, 10x each LE  -heel raise 15x   Seated:  Sit to stands 10x with focus with  decreased UE support to SUE support; cues for nose over toes.   RTB row 10x ; 2 sets          Patient's vitals monitored throughout the session.  Pt educated throughout session about proper posture and technique with exercises. Improved exercise technique, movement at target joints, use of target muscles after min to mod verbal, visual, tactile cues   Patient is challenged with dual task ambulation with increased episodic freezing and shuffle stepping with horizontal head turns. She tolerates increased resistance with strengthening interventions indicating increased LE strengthening. The patient continues to benefit from additional skilled PT services to improve gait pattern, balance and LE strength for improved quality of life                 PT Education - 06/10/20 0848    Education provided Yes    Education Details exercise technique, body mechanics    Person(s) Educated Patient    Methods Explanation;Tactile cues;Demonstration;Verbal cues    Comprehension Verbalized understanding;Returned demonstration;Verbal cues required;Tactile cues required            PT Short Term Goals - 05/26/20 1004      PT SHORT TERM GOAL #1   Title Pt to demonstrate improved hip extension strength AEB ability to perform 5 full height hooklying bridges.    Baseline unable to rise from chair hands free due to hip extension weakness    Time 4    Period Weeks    Status Partially Met    Target Date 05/04/20      PT SHORT TERM GOAL #2   Title Pt to demonstrate SLS balance >3 sec hands free to facilitate weight shift in gait and single limb gross motor coordination.    Baseline Requires modA for weightshift, unable to remain in SLS without BUE 4/4: unable to perform 5/11: 1-2 seconds    Period Weeks    Status Partially Met    Target Date 05/04/20      PT SHORT TERM GOAL #3   Title Pt to demonstrate 5xSTS hands free from single airex pad in <20sec s LOB.    Baseline unable to rise from chair  without BUE 4/4: 31.75 ft    Time 4    Period Weeks    Status Partially Met    Target Date 05/04/20             PT Long Term Goals - 05/26/20 1002      PT LONG TERM GOAL #1   Title Patient will increase FOTO score to equal to or greater than 65% to demonstrate statistically significant improvement in mobility and quality of life.    Baseline 10/20/19: 32% 11/24: 50.7%; 04/06/20 5/11: 45%    Time 8    Period Weeks    Status On-going    Target Date 06/29/20      PT LONG  TERM GOAL #2   Title Patient will complete five times sit to stand hands free from chair + airex in < 16 seconds.    Baseline Unable to rise from airex without use of hands 4/4: 31.75 seconds with no UE support 5/11: 17.23 seconds with BUE support    Time 8    Period Weeks    Status Partially Met    Target Date 06/29/20      PT LONG TERM GOAL #3   Title Pt to demonstrate tolerance to 6MWT c completion of 825f c 4WW, no LOB, supervision level assistance.    Baseline limited by fatigue, baseline deferred to next session. 4/4: 673 ft with rollator 5/11: 665 ft    Time 8    Period Weeks    Status Partially Met    Target Date 06/29/20      PT LONG TERM GOAL #4   Title Pt to tolerate 6MWT c 4WW, supervision level assessment >10062fto facilitate confidence and safety in limited community distance AMB.    Baseline Not performed yet. 4/4: 673 ft with rollator 5/11: 665 ft with rollator    Time 12    Period Weeks    Status Partially Met    Target Date 06/29/20      PT LONG TERM GOAL #5   Title Pt to perform 5xSTS from chair + airex hands free in <14sec to demonstrate improved motor control in transfers and bilat hip extension strength.    Baseline no baseline yet 4/4: 31.75 seconds with no UE support 5/11: 17.23 seconds with BUE support    Time 12    Period Weeks    Status Partially Met    Target Date 06/29/20                 Plan - 06/10/20 0913    Clinical Impression Statement Patient is challenged  with dual task ambulation with increased episodic freezing and shuffle stepping with horizontal head turns. She tolerates increased resistance with strengthening interventions indicating increased LE strengthening. The patient continues to benefit from additional skilled PT services to improve gait pattern, balance and LE strength for improved quality of life    Personal Factors and Comorbidities Time since onset of injury/illness/exacerbation;Age;Comorbidity 3+;Past/Current Experience;Transportation    Comorbidities osteoporosis, anemia, hx cancer, hyperlipidemia    Examination-Activity Limitations Bathing;Bed Mobility;Dressing;Reach Overhead;Stairs;Stand;Toileting;Locomotion Level;Squat;Transfers;Bend;Hygiene/Grooming;Carry;Sit    Examination-Participation Restrictions Community Activity;Driving;Shop;Yard Work;Cleaning;Meal Prep    Stability/Clinical Decision Making Evolving/Moderate complexity    Rehab Potential Good    PT Frequency 2x / week    PT Duration 8 weeks    PT Treatment/Interventions ADLs/Self Care Home Management;Aquatic Therapy;Cryotherapy;Electrical Stimulation;DME Instruction;Gait training;Stair training;Functional mobility training;Therapeutic activities;Therapeutic exercise;Balance training;Neuromuscular re-education;Patient/family education;Manual techniques;Passive range of motion;Dry needling;Spinal Manipulations;Joint Manipulations;Orthotic Fit/Training;Energy conservation    PT Next Visit Plan Reducing shuffling gait with turns. LLE strengthening.    PT Home Exercise Plan not administered this date    Consulted and Agree with Plan of Care Patient           Patient will benefit from skilled therapeutic intervention in order to improve the following deficits and impairments:  Abnormal gait,Decreased balance,Decreased mobility,Difficulty walking,Hypomobility,Decreased strength,Decreased knowledge of use of DME,Decreased coordination,Postural dysfunction,Impaired  flexibility,Improper body mechanics,Impaired perceived functional ability,Decreased activity tolerance  Visit Diagnosis: Unsteadiness on feet  Abnormality of gait and mobility  Muscle weakness (generalized)     Problem List Patient Active Problem List   Diagnosis Date Noted  . Cognitive deficits   . Labile blood pressure   .  Acute blood loss anemia   . Slow transit constipation   . Vascular headache   . 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 Hydrocephalus ([place]) 07/03/2017  . 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 Pelvic fracture ([place]) 04/18/2016  . CD (celiac disease) 08/04/2014  . Cancer of upper lobe of right lung ([place]) 08/04/2014  . Age related osteoporosis 11/26/2013  . Absolute anemia 05/21/2013  . H/O malignant neoplasm 05/21/2013  . HLD (hyperlipidemia) 05/21/2013   Janna Arch, PT, DPT   06/10/2020, 9:30 AM  [place] MAIN [facility] SERVICES [address] [place], [place], [address] Phone: [phone]   Fax:  [phone]  Name: Krista Evans MRN: [id number] Date of Birth: 1945/06/18

## 2020-06-15 ENCOUNTER — Other Ambulatory Visit: Payer: Self-pay

## 2020-06-15 ENCOUNTER — Ambulatory Visit: Payer: PPO

## 2020-06-15 DIAGNOSIS — R269 Unspecified abnormalities of gait and mobility: Secondary | ICD-10-CM

## 2020-06-15 DIAGNOSIS — M6281 Muscle weakness (generalized): Secondary | ICD-10-CM

## 2020-06-15 DIAGNOSIS — R2681 Unsteadiness on feet: Secondary | ICD-10-CM | POA: Diagnosis not present

## 2020-06-15 NOTE — Therapy (Signed)
[place] MAIN [facility] SERVICES [address] [place], [place], [address] Phone: [phone]   Fax:  [phone]  Physical Therapy Treatment  Patient Details  Name: Krista Evans MRN: [id number] Date of Birth: April 13, 1945 Referring Provider (PT): Ramonita Lab   Encounter Date: 06/15/2020   PT End of Session - 06/15/20 0924    Visit Number 25    Number of Visits 65    Date for PT Re-Evaluation 06/29/20    Authorization Type [employer];  4/10 PN 05/26/20    Authorization Time Period 02/10/20-04/06/20; 04/06/20-06/29/20    PT Start Time 9507    PT Stop Time 1014    PT Time Calculation (min) 46 min    Equipment Utilized During Treatment Gait belt    Activity Tolerance Patient tolerated treatment well;No increased pain    Behavior During Therapy WFL for tasks assessed/performed           Past Medical History:  Diagnosis Date  . Abnormal Q waves on electrocardiogram   . Anemia   . Aortic atherosclerosis ([place])    "I could possibly have it, my grandmother had it"  . Cancer [facility]) 05/2011   Right upper Lung CA with partial Lobectomy.  . Celiac disease   . Celiac disease   . Dyspnea    with exertion  . Hyperlipidemia   . Hyperlipidemia   . Lung mass   . Meningioma ([place])   . Osteoporosis   . Personal history of chemotherapy   . Personal history of radiation therapy     Past Surgical History:  Procedure Laterality Date  . LUNG LOBECTOMY     right lung  . 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   Subjective Assessment - 06/15/20 0933    Subjective Patient reports she was able to go walking with her granddaughter yesterday. No falls or LOB since last session.    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 Patient has attended OP PT in the past year for LLE strengthening and balance with positive results, but has not been in attendance recently due to COVID-19 pandemic. PMH includes osteoporosis, anemia, celiac disease, hyperlipidemia, hx of R upper lobe mass with resection 6/13, hx of pneumonia, meningioma, major atherosclerosis. VP shunt has been placed 06/2017.    Currently in Pain? No/denies                   neuro re-ed In hallway: Horizontal head turns reading cards while walking with rollator: cues for decreasing shuffle step, upright posture, and scanning of environment:  -scanning for letters 2x 86 ft, scanning for numbers 2x 86 ft; episodic freezing with fatigue   initiation/termination of ambulation 2x 86 ft with occasional freezing and shuffle steppage with sudden change. Close CGA required.   Ambulate 8 ft with SUE support x 2 trials, max cueing for safety due to patient attempting to reach for armrest prior to safe region resulting in near LOB requiring PT to stabilize patient.    Seated toe taps onto sticky note for coordination, spatial awareness and sequencing x 12 each LE   Therex:  Nustep Lvl 3 seat position 9; L arm position 10  RPM> 60 for cardiovascular challenge and UE/LE coordination 4 minutes  Seated:  RTB around bilateral ankles:  -LAQ with focus on abduction and quad  Contraction 15x each LE -alternating IR/ER 15x each LE  RTB hamstring curls 12x each LE RTB around knees: single leg abduction 15x each LE, patient challenged with stabilizing opp LE.    RTB around knees: single limb march 10x with upright posture.  RTB row 10x ; 2 sets          Patient's vitals monitored throughout the session.  Pt educated throughout session about proper posture and technique with exercises. Improved exercise technique, movement at target joints, use of target muscles after min to mod verbal, visual, tactile cues   Patient  continues to have episodic shuffle steppage throughout physical therapy session. Education on safety with reaching for fall reduction technique performed with patient verbalizing understanding. Patient fatigues quickly throughout session requiring multiple rest breaks with LLE fatiguing quicker than right. The patient continues to benefit from additional skilled PT services to improve gait pattern, balance and LE strength for improved quality of life                      PT Education - 06/15/20 0923    Education provided Yes    Education Details exercise technique, body mechanics    Person(s) Educated Patient    Methods Explanation;Demonstration;Tactile cues;Verbal cues    Comprehension Verbalized understanding;Returned demonstration;Verbal cues required;Tactile cues required            PT Short Term Goals - 05/26/20 1004      PT SHORT TERM GOAL #1   Title Pt to demonstrate improved hip extension strength AEB ability to perform 5 full height hooklying bridges.    Baseline unable to rise from chair hands free due to hip extension weakness    Time 4    Period Weeks    Status Partially Met    Target Date 05/04/20      PT SHORT TERM GOAL #2   Title Pt to demonstrate SLS balance >3 sec hands free to facilitate weight shift in gait and single limb gross motor coordination.    Baseline Requires modA for weightshift, unable to remain in SLS without BUE 4/4: unable to perform 5/11: 1-2 seconds    Period Weeks    Status Partially Met    Target Date 05/04/20      PT SHORT TERM GOAL #3   Title Pt to demonstrate 5xSTS hands free from single airex pad in <20sec s LOB.    Baseline unable to rise from chair without BUE 4/4: 31.75 ft    Time 4    Period Weeks    Status Partially Met    Target Date 05/04/20             PT Long Term Goals - 05/26/20 1002      PT LONG TERM GOAL #1   Title Patient will increase FOTO score to equal to or greater than 65% to demonstrate  statistically significant improvement in mobility and quality of life.    Baseline 10/20/19: 32% 11/24: 50.7%; 04/06/20 5/11: 45%    Time 8    Period Weeks    Status On-going    Target Date 06/29/20      PT LONG TERM GOAL #2   Title Patient will complete five times sit to stand hands free from chair + airex in < 16 seconds.    Baseline Unable to rise from airex without use of hands 4/4: 31.75 seconds with  no UE support 5/11: 17.23 seconds with BUE support    Time 8    Period Weeks    Status Partially Met    Target Date 06/29/20      PT LONG TERM GOAL #3   Title Pt to demonstrate tolerance to 6MWT c completion of 815f c 4WW, no LOB, supervision level assistance.    Baseline limited by fatigue, baseline deferred to next session. 4/4: 673 ft with rollator 5/11: 665 ft    Time 8    Period Weeks    Status Partially Met    Target Date 06/29/20      PT LONG TERM GOAL #4   Title Pt to tolerate 6MWT c 4WW, supervision level assessment >10039fto facilitate confidence and safety in limited community distance AMB.    Baseline Not performed yet. 4/4: 673 ft with rollator 5/11: 665 ft with rollator    Time 12    Period Weeks    Status Partially Met    Target Date 06/29/20      PT LONG TERM GOAL #5   Title Pt to perform 5xSTS from chair + airex hands free in <14sec to demonstrate improved motor control in transfers and bilat hip extension strength.    Baseline no baseline yet 4/4: 31.75 seconds with no UE support 5/11: 17.23 seconds with BUE support    Time 12    Period Weeks    Status Partially Met    Target Date 06/29/20                 Plan - 06/15/20 0956    Clinical Impression Statement Patient continues to have episodic shuffle steppage throughout physical therapy session. Education on safety with reaching for fall reduction technique performed with patient verbalizing understanding. Patient fatigues quickly throughout session requiring multiple rest breaks with LLE fatiguing  quicker than right. The patient continues to benefit from additional skilled PT services to improve gait pattern, balance and LE strength for improved quality of life    Personal Factors and Comorbidities Time since onset of injury/illness/exacerbation;Age;Comorbidity 3+;Past/Current Experience;Transportation    Comorbidities osteoporosis, anemia, hx cancer, hyperlipidemia    Examination-Activity Limitations Bathing;Bed Mobility;Dressing;Reach Overhead;Stairs;Stand;Toileting;Locomotion Level;Squat;Transfers;Bend;Hygiene/Grooming;Carry;Sit    Examination-Participation Restrictions Community Activity;Driving;Shop;Yard Work;Cleaning;Meal Prep    Stability/Clinical Decision Making Evolving/Moderate complexity    Rehab Potential Good    PT Frequency 2x / week    PT Duration 8 weeks    PT Treatment/Interventions ADLs/Self Care Home Management;Aquatic Therapy;Cryotherapy;Electrical Stimulation;DME Instruction;Gait training;Stair training;Functional mobility training;Therapeutic activities;Therapeutic exercise;Balance training;Neuromuscular re-education;Patient/family education;Manual techniques;Passive range of motion;Dry needling;Spinal Manipulations;Joint Manipulations;Orthotic Fit/Training;Energy conservation    PT Next Visit Plan Reducing shuffling gait with turns. LLE strengthening.    PT Home Exercise Plan not administered this date    Consulted and Agree with Plan of Care Patient           Patient will benefit from skilled therapeutic intervention in order to improve the following deficits and impairments:  Abnormal gait,Decreased balance,Decreased mobility,Difficulty walking,Hypomobility,Decreased strength,Decreased knowledge of use of DME,Decreased coordination,Postural dysfunction,Impaired flexibility,Improper body mechanics,Impaired perceived functional ability,Decreased activity tolerance  Visit Diagnosis: Unsteadiness on feet  Abnormality of gait and mobility  Muscle weakness  (generalized)     Problem List Patient Active Problem List   Diagnosis Date Noted  . Cognitive deficits   . Labile blood pressure   . Acute blood loss anemia   . Slow transit constipation   . Vascular headache   . 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 Hydrocephalus ([place]06/18/2019  .  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 Pelvic fracture ([place]) 04/18/2016  . CD (celiac disease) 08/04/2014  . Cancer of upper lobe of right lung ([place]) 08/04/2014  . Age related osteoporosis 11/26/2013  . Absolute anemia 05/21/2013  . H/O malignant neoplasm 05/21/2013  . HLD (hyperlipidemia) 05/21/2013   Janna Arch, PT, DPT   06/15/2020, 10:14 AM  [place] MAIN [facility] SERVICES [address] [place], [place], [address] Phone: [phone]   Fax:  [phone]  Name: Krista Evans MRN: [id number] Date of Birth: 1945-11-09

## 2020-06-17 ENCOUNTER — Ambulatory Visit: Payer: PPO | Attending: Internal Medicine

## 2020-06-17 ENCOUNTER — Other Ambulatory Visit: Payer: Self-pay

## 2020-06-17 DIAGNOSIS — R269 Unspecified abnormalities of gait and mobility: Secondary | ICD-10-CM | POA: Diagnosis not present

## 2020-06-17 DIAGNOSIS — M6281 Muscle weakness (generalized): Secondary | ICD-10-CM | POA: Diagnosis not present

## 2020-06-17 DIAGNOSIS — R2681 Unsteadiness on feet: Secondary | ICD-10-CM | POA: Insufficient documentation

## 2020-06-17 NOTE — Therapy (Signed)
[place] MAIN [facility] SERVICES [address] [place], [place], [address] Phone: [phone]   Fax:  [phone]  Physical Therapy Treatment  Patient Details  Name: Krista Evans MRN: [id number] Date of Birth: 1945-05-15 Referring Provider (PT): Ramonita Lab   Encounter Date: 06/17/2020   PT End of Session - 06/17/20 0857    Visit Number 55    Number of Visits 65    Date for PT Re-Evaluation 06/29/20    Authorization Type [employer];  5/10 PN 05/26/20    Authorization Time Period 02/10/20-04/06/20; 04/06/20-06/29/20    PT Start Time 0845    PT Stop Time 0928    PT Time Calculation (min) 43 min    Equipment Utilized During Treatment Gait belt    Activity Tolerance Patient tolerated treatment well;No increased pain    Behavior During Therapy WFL for tasks assessed/performed           Past Medical History:  Diagnosis Date  . Abnormal Q waves on electrocardiogram   . Anemia   . Aortic atherosclerosis ([place])    "I could possibly have it, my grandmother had it"  . Cancer [facility]) 05/2011   Right upper Lung CA with partial Lobectomy.  . Celiac disease   . Celiac disease   . Dyspnea    with exertion  . Hyperlipidemia   . Hyperlipidemia   . Lung mass   . Meningioma ([place])   . Osteoporosis   . Personal history of chemotherapy   . Personal history of radiation therapy     Past Surgical History:  Procedure Laterality Date  . LUNG LOBECTOMY     right lung  . VENTRICULOPERITONEAL SHUNT Right 07/03/2017   Procedure: SHUNT INSERTION VENTRICULAR-PERITONEAL;  Surgeon: Newman Pies, MD;  Location: [place];  Service: Neurosurgery;  Laterality: Right;    There were no vitals filed for this visit.   Subjective Assessment - 06/17/20 0849    Subjective Patient reports no falls or LOB since last session. Reports she feels better than last session.    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 Patient has attended OP PT in the past year for LLE strengthening and balance with positive results, but has not been in attendance recently due to COVID-19 pandemic. PMH includes osteoporosis, anemia, celiac disease, hyperlipidemia, hx of R upper lobe mass with resection 6/13, hx of pneumonia, meningioma, major atherosclerosis. VP shunt has been placed 06/2017.    Currently in Pain? No/denies               neuro re-ed   SUE support march: (added to HEP) 10x each LE  Ambulate 8 ft with SUE support x 2 trials, max cueing for safety due to patient attempting to reach for armrest prior to safe region resulting in near LOB requiring PT to stabilize patient. -terminated due to patient fear and near LOB.    SLS with BUE support 30 seconds each LE  Therex:  Ambulate >700 ft with rollator and close CGA, patient requires cueing for step length, reduction of freezing when changing rooms or approaching obstacles.Cues for negotiationof walker required for negotiation of obstacles in pathway  Standing with # 3lb ankle weight: CGA for stability  -Hip extension with bilateral upper extremity support, cueing for neutral hip alignment, upright posture for optimal muscle recruitment, and sequencing, 15x each LE,  -Hip abduction with bilateral upper extremity support, cueing for neutral foot alignment  for correct muscle activation, 10x each LE -Hip flexion with bilateral upper extremity support, cueing for body mechanics, speed of muscle recruitment for optimal strengthening and stabilization 10x each LE -Hamstring curl with bilateral upper extremity support, cueing for knee alignment for recruitment of hamstring musculature, 10x each LE   Seated with # 3lb  ankle weights  -Seated marches with upright posture, back away from back of chair for abdominal/trunk activation/stabilization, 15x each LE -Seated LAQ with 3 second holds, 10x each LE, cueing for muscle activation and  sequencing for neutral alignment -Seated IR/ER with cueing for stabilizing knee placement with lateral foot movement for optimal muscle recruitment, 10x each LE - heel toe raise 15x      Patient's vitals monitored throughout the session.  Pt educated throughout session about proper posture and technique with exercises. Improved exercise technique, movement at target joints, use of target muscles after min to mod verbal, visual, tactile cues   Patient is very fearful of ambulation with SUE support and requires max encouragement. Her far and limited ability to ambulate without shuffle steppage/freezing results in near LOB. Patient fatigues quickly throughout session requiring multiple rest breaks with LLE fatiguing quicker than right. The patient continues to benefit from additional skilled PT services to improve gait pattern, balance and LE strength for improved quality of life                        PT Education - 06/17/20 0856    Education provided Yes    Education Details exercise technique, body mechanics    Person(s) Educated Patient    Methods Explanation;Demonstration;Tactile cues;Verbal cues    Comprehension Verbalized understanding;Returned demonstration;Verbal cues required;Tactile cues required            PT Short Term Goals - 05/26/20 1004      PT SHORT TERM GOAL #1   Title Pt to demonstrate improved hip extension strength AEB ability to perform 5 full height hooklying bridges.    Baseline unable to rise from chair hands free due to hip extension weakness    Time 4    Period Weeks    Status Partially Met    Target Date 05/04/20      PT SHORT TERM GOAL #2   Title Pt to demonstrate SLS balance >3 sec hands free to facilitate weight shift in gait and single limb gross motor coordination.    Baseline Requires modA for weightshift, unable to remain in SLS without BUE 4/4: unable to perform 5/11: 1-2 seconds    Period Weeks    Status Partially Met    Target  Date 05/04/20      PT SHORT TERM GOAL #3   Title Pt to demonstrate 5xSTS hands free from single airex pad in <20sec s LOB.    Baseline unable to rise from chair without BUE 4/4: 31.75 ft    Time 4    Period Weeks    Status Partially Met    Target Date 05/04/20             PT Long Term Goals - 05/26/20 1002      PT LONG TERM GOAL #1   Title Patient will increase FOTO score to equal to or greater than 65% to demonstrate statistically significant improvement in mobility and quality of life.    Baseline 10/20/19: 32% 11/24: 50.7%; 04/06/20 5/11: 45%    Time 8    Period Weeks    Status On-going  Target Date 06/29/20      PT LONG TERM GOAL #2   Title Patient will complete five times sit to stand hands free from chair + airex in < 16 seconds.    Baseline Unable to rise from airex without use of hands 4/4: 31.75 seconds with no UE support 5/11: 17.23 seconds with BUE support    Time 8    Period Weeks    Status Partially Met    Target Date 06/29/20      PT LONG TERM GOAL #3   Title Pt to demonstrate tolerance to 6MWT c completion of 843f c 4WW, no LOB, supervision level assistance.    Baseline limited by fatigue, baseline deferred to next session. 4/4: 673 ft with rollator 5/11: 665 ft    Time 8    Period Weeks    Status Partially Met    Target Date 06/29/20      PT LONG TERM GOAL #4   Title Pt to tolerate 6MWT c 4WW, supervision level assessment >10065fto facilitate confidence and safety in limited community distance AMB.    Baseline Not performed yet. 4/4: 673 ft with rollator 5/11: 665 ft with rollator    Time 12    Period Weeks    Status Partially Met    Target Date 06/29/20      PT LONG TERM GOAL #5   Title Pt to perform 5xSTS from chair + airex hands free in <14sec to demonstrate improved motor control in transfers and bilat hip extension strength.    Baseline no baseline yet 4/4: 31.75 seconds with no UE support 5/11: 17.23 seconds with BUE support    Time 12     Period Weeks    Status Partially Met    Target Date 06/29/20                 Plan - 06/17/20 0916    Clinical Impression Statement Patient is very fearful of ambulation with SUE support and requires max encouragement. Her far and limited ability to ambulate without shuffle steppage/freezing results in near LOB. Patient fatigues quickly throughout session requiring multiple rest breaks with LLE fatiguing quicker than right. The patient continues to benefit from additional skilled PT services to improve gait pattern, balance and LE strength for improved quality of life    Personal Factors and Comorbidities Time since onset of injury/illness/exacerbation;Age;Comorbidity 3+;Past/Current Experience;Transportation    Comorbidities osteoporosis, anemia, hx cancer, hyperlipidemia    Examination-Activity Limitations Bathing;Bed Mobility;Dressing;Reach Overhead;Stairs;Stand;Toileting;Locomotion Level;Squat;Transfers;Bend;Hygiene/Grooming;Carry;Sit    Examination-Participation Restrictions Community Activity;Driving;Shop;Yard Work;Cleaning;Meal Prep    Stability/Clinical Decision Making Evolving/Moderate complexity    Rehab Potential Good    PT Frequency 2x / week    PT Duration 8 weeks    PT Treatment/Interventions ADLs/Self Care Home Management;Aquatic Therapy;Cryotherapy;Electrical Stimulation;DME Instruction;Gait training;Stair training;Functional mobility training;Therapeutic activities;Therapeutic exercise;Balance training;Neuromuscular re-education;Patient/family education;Manual techniques;Passive range of motion;Dry needling;Spinal Manipulations;Joint Manipulations;Orthotic Fit/Training;Energy conservation    PT Next Visit Plan Reducing shuffling gait with turns. LLE strengthening.    PT Home Exercise Plan not administered this date    Consulted and Agree with Plan of Care Patient           Patient will benefit from skilled therapeutic intervention in order to improve the following  deficits and impairments:  Abnormal gait,Decreased balance,Decreased mobility,Difficulty walking,Hypomobility,Decreased strength,Decreased knowledge of use of DME,Decreased coordination,Postural dysfunction,Impaired flexibility,Improper body mechanics,Impaired perceived functional ability,Decreased activity tolerance  Visit Diagnosis: Unsteadiness on feet  Abnormality of gait and mobility  Muscle weakness (generalized)  Problem List Patient Active Problem List   Diagnosis Date Noted  . Cognitive deficits   . Labile blood pressure   . Acute blood loss anemia   . Slow transit constipation   . Vascular headache   . 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 Hydrocephalus ([place]) 07/03/2017  . 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 Pelvic fracture ([place]) 04/18/2016  . CD (celiac disease) 08/04/2014  . Cancer of upper lobe of right lung ([place]) 08/04/2014  . Age related osteoporosis 11/26/2013  . Absolute anemia 05/21/2013  . H/O malignant neoplasm 05/21/2013  . HLD (hyperlipidemia) 05/21/2013   Janna Arch, PT, DPT   06/17/2020, 9:28 AM  [place] MAIN [facility] SERVICES [address] [place], [place], [address] Phone: [phone]   Fax:  [phone]  Name: Krista Evans MRN: [id number] Date of Birth: 09-Nov-1945

## 2020-06-24 ENCOUNTER — Ambulatory Visit: Payer: PPO

## 2020-06-28 ENCOUNTER — Ambulatory Visit: Payer: PPO | Admitting: Physical Therapy

## 2020-06-28 ENCOUNTER — Other Ambulatory Visit: Payer: Self-pay

## 2020-06-28 ENCOUNTER — Encounter: Payer: Self-pay | Admitting: Physical Therapy

## 2020-06-28 DIAGNOSIS — R2681 Unsteadiness on feet: Secondary | ICD-10-CM | POA: Diagnosis not present

## 2020-06-28 DIAGNOSIS — M6281 Muscle weakness (generalized): Secondary | ICD-10-CM

## 2020-06-28 DIAGNOSIS — R269 Unspecified abnormalities of gait and mobility: Secondary | ICD-10-CM

## 2020-06-28 NOTE — Therapy (Signed)
[place] MAIN [facility] SERVICES [address] [place], [place], [address] Phone: [phone]   Fax:  [phone]  Physical Therapy Treatment  Patient Details  Name: Krista Evans MRN: [id number] Date of Birth: Apr 28, 1945 Referring Provider (PT): Ramonita Lab   Encounter Date: 06/28/2020   PT End of Session - 06/28/20 1016     Visit Number 19    Number of Visits 84    Date for PT Re-Evaluation 06/29/20    Authorization Type [employer];  5/10 PN 05/26/20    Authorization Time Period 02/10/20-04/06/20; 04/06/20-06/29/20    PT Start Time 1016    PT Stop Time 1100    PT Time Calculation (min) 44 min    Equipment Utilized During Treatment Gait belt    Activity Tolerance Patient tolerated treatment well;No increased pain    Behavior During Therapy WFL for tasks assessed/performed             Past Medical History:  Diagnosis Date   Abnormal Q waves on electrocardiogram    Anemia    Aortic atherosclerosis (HCC)    "I could possibly have it, my grandmother had it"   Cancer ([place]) 05/2011   Right upper Lung CA with partial Lobectomy.   Celiac disease    Celiac disease    Dyspnea    with exertion   Hyperlipidemia    Hyperlipidemia    Lung mass    Meningioma (HCC)    Osteoporosis    Personal history of chemotherapy    Personal history of radiation therapy     Past Surgical History:  Procedure Laterality Date   LUNG LOBECTOMY     right lung   VENTRICULOPERITONEAL SHUNT Right 07/03/2017   Procedure: SHUNT INSERTION VENTRICULAR-PERITONEAL;  Surgeon: Newman Pies, MD;  Location: [place];  Service: Neurosurgery;  Laterality: Right;    There were no vitals filed for this visit.   Subjective Assessment - 06/28/20 1025     Subjective Patient reports no falls or LOB since last session. Reports she was able to watch some baseball games over the weekend. 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 Patient has attended OP PT in the past year for LLE strengthening and balance with positive results, but has not been in attendance recently due to COVID-19 pandemic. PMH includes osteoporosis, anemia, celiac disease, hyperlipidemia, hx of R upper lobe mass with resection 6/13, hx of pneumonia, meningioma, major atherosclerosis. VP shunt has been placed 06/2017.    Currently in Pain? No/denies    Multiple Pain Sites No                  Therex:  Ambulate >700 ft x1, 250 feet x1 with rollator and close CGA, patient requires cueing for step length, reduction of freezing when changing rooms or approaching obstacles. Cues for negotiation of walker required for negotiation of obstacles in pathway   Standing with # 3lb ankle weight: CGA for stability   -Hip extension with bilateral upper extremity support, cueing for neutral hip alignment, upright posture for optimal muscle recruitment, and sequencing, 10x each LE x2 sets, -Hip abduction with single upper extremity support, cueing for neutral foot alignment for correct muscle activation, 10x each LE -Hip flexion with single upper extremity support, cueing for body mechanics, speed of muscle recruitment for optimal strengthening and stabilization 15x each LE -Side stepping with 1 rail assist (  1 hand hold assist) x10 feet x2 laps each direction with mod VCs to increase step length and slow down step to improve motor planning and mechanics. Patient often shuffles steps with less hand hold assist.   -BLE heel raises with no rail assist, min A for safety and mod VCS for proper positioning including to keep knee extended to isolate calf muscle activation;    Seated with # 3lb  ankle weights -Seated marches with upright posture, back away from back of chair for abdominal/trunk activation/stabilization, 15x each LE -Seated LAQ with 3 second holds, 15x each LE, cueing to increase ankle DF  for better stretch and ROM;        Patient's vitals monitored throughout the session. She was short of breath with prolonged standing, SPO2 96% + with HR in mid 90's;   Pt educated throughout session about proper posture and technique with exercises. Improved exercise technique, movement at target joints, use of target muscles after min to mod verbal, visual, tactile cues   Patient is very fearful of exercise with SUE support and requires max encouragement. Her limited ability to ambulate without shuffle steppage/freezing results in near LOB. Patient fatigues quickly throughout session requiring multiple rest breaks.  The patient continues to benefit from additional skilled PT services to improve gait pattern, balance and LE strength for improved quality of life               PT Education - 06/28/20 1016     Education provided Yes    Education Details LE strengthening, balance/gait safety;    Person(s) Educated Patient    Methods Explanation;Verbal cues    Comprehension Verbalized understanding;Returned demonstration;Verbal cues required;Need further instruction              PT Short Term Goals - 05/26/20 1004       PT SHORT TERM GOAL #1   Title Pt to demonstrate improved hip extension strength AEB ability to perform 5 full height hooklying bridges.    Baseline unable to rise from chair hands free due to hip extension weakness    Time 4    Period Weeks    Status Partially Met    Target Date 05/04/20      PT SHORT TERM GOAL #2   Title Pt to demonstrate SLS balance >3 sec hands free to facilitate weight shift in gait and single limb gross motor coordination.    Baseline Requires modA for weightshift, unable to remain in SLS without BUE 4/4: unable to perform 5/11: 1-2 seconds    Period Weeks    Status Partially Met    Target Date 05/04/20      PT SHORT TERM GOAL #3   Title Pt to demonstrate 5xSTS hands free from single airex pad in <20sec s LOB.    Baseline unable  to rise from chair without BUE 4/4: 31.75 ft    Time 4    Period Weeks    Status Partially Met    Target Date 05/04/20               PT Long Term Goals - 05/26/20 1002       PT LONG TERM GOAL #1   Title Patient will increase FOTO score to equal to or greater than 65% to demonstrate statistically significant improvement in mobility and quality of life.    Baseline 10/20/19: 32% 11/24: 50.7%; 04/06/20 5/11: 45%    Time 8    Period Weeks    Status On-going  Target Date 06/29/20      PT LONG TERM GOAL #2   Title Patient will complete five times sit to stand hands free from chair + airex in < 16 seconds.    Baseline Unable to rise from airex without use of hands 4/4: 31.75 seconds with no UE support 5/11: 17.23 seconds with BUE support    Time 8    Period Weeks    Status Partially Met    Target Date 06/29/20      PT LONG TERM GOAL #3   Title Pt to demonstrate tolerance to 6MWT c completion of 835f c 4WW, no LOB, supervision level assistance.    Baseline limited by fatigue, baseline deferred to next session. 4/4: 673 ft with rollator 5/11: 665 ft    Time 8    Period Weeks    Status Partially Met    Target Date 06/29/20      PT LONG TERM GOAL #4   Title Pt to tolerate 6MWT c 4WW, supervision level assessment >10023fto facilitate confidence and safety in limited community distance AMB.    Baseline Not performed yet. 4/4: 673 ft with rollator 5/11: 665 ft with rollator    Time 12    Period Weeks    Status Partially Met    Target Date 06/29/20      PT LONG TERM GOAL #5   Title Pt to perform 5xSTS from chair + airex hands free in <14sec to demonstrate improved motor control in transfers and bilat hip extension strength.    Baseline no baseline yet 4/4: 31.75 seconds with no UE support 5/11: 17.23 seconds with BUE support    Time 12    Period Weeks    Status Partially Met    Target Date 06/29/20                   Plan - 06/28/20 1104     Clinical Impression  Statement Patient motivated and participated well within session. She continues to have epsides of shuffled gait when walking in narrow environment or when changing rooms. She is able to self correct with slight pause and then starts back with larger steps and better reciprocal gait pattern. Progressed LE strengthening with instruction in LE exercise with 1 rail assist to challenge stance control. Patient does require CGA to min A with standing exercise and required cues for weight shift for better LE motor control. She does fatigue quickly with increased shortness of breath. However SPO2 levels were WNL. Patient would benefit from additional skilled PT Intervention to improve strength, balance and gait safety; Will address goals next session;    Personal Factors and Comorbidities Time since onset of injury/illness/exacerbation;Age;Comorbidity 3+;Past/Current Experience;Transportation    Comorbidities osteoporosis, anemia, hx cancer, hyperlipidemia    Examination-Activity Limitations Bathing;Bed Mobility;Dressing;Reach Overhead;Stairs;Stand;Toileting;Locomotion Level;Squat;Transfers;Bend;Hygiene/Grooming;Carry;Sit    Examination-Participation Restrictions Community Activity;Driving;Shop;Yard Work;Cleaning;Meal Prep    Stability/Clinical Decision Making Evolving/Moderate complexity    Rehab Potential Good    PT Frequency 2x / week    PT Duration 8 weeks    PT Treatment/Interventions ADLs/Self Care Home Management;Aquatic Therapy;Cryotherapy;Electrical Stimulation;DME Instruction;Gait training;Stair training;Functional mobility training;Therapeutic activities;Therapeutic exercise;Balance training;Neuromuscular re-education;Patient/family education;Manual techniques;Passive range of motion;Dry needling;Spinal Manipulations;Joint Manipulations;Orthotic Fit/Training;Energy conservation    PT Next Visit Plan Reducing shuffling gait with turns. LLE strengthening.    PT Home Exercise Plan not administered this  date    Consulted and Agree with Plan of Care Patient  Patient will benefit from skilled therapeutic intervention in order to improve the following deficits and impairments:  Abnormal gait, Decreased balance, Decreased mobility, Difficulty walking, Hypomobility, Decreased strength, Decreased knowledge of use of DME, Decreased coordination, Postural dysfunction, Impaired flexibility, Improper body mechanics, Impaired perceived functional ability, Decreased activity tolerance  Visit Diagnosis: Unsteadiness on feet  Abnormality of gait and mobility  Muscle weakness (generalized)  Neurologic gait disorder     Problem List Patient Active Problem List   Diagnosis Date Noted   Cognitive deficits    Labile blood pressure    Acute blood loss anemia    Slow transit constipation    Vascular headache    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   Hydrocephalus ([place]) 07/03/2017   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

## 2020-06-30 ENCOUNTER — Ambulatory Visit: Payer: PPO

## 2020-06-30 ENCOUNTER — Other Ambulatory Visit: Payer: Self-pay

## 2020-06-30 DIAGNOSIS — R2681 Unsteadiness on feet: Secondary | ICD-10-CM | POA: Diagnosis not present

## 2020-06-30 DIAGNOSIS — R269 Unspecified abnormalities of gait and mobility: Secondary | ICD-10-CM

## 2020-06-30 DIAGNOSIS — M6281 Muscle weakness (generalized): Secondary | ICD-10-CM

## 2020-06-30 NOTE — Therapy (Signed)
[place] MAIN [facility] SERVICES [address] [place], [place], [address] Phone: [phone]   Fax:  [phone]  Physical Therapy Treatment  Patient Details  Name: Krista Evans MRN: [id number] Date of Birth: 1945-11-16 Referring Provider (PT): Ramonita Lab   Encounter Date: 06/30/2020   PT End of Session - 06/30/20 1559     Visit Number 6    Number of Visits 55    Date for PT Re-Evaluation 09/22/20    Authorization Type [employer];  7/10 PN 05/26/20    Authorization Time Period 02/10/20-04/06/20; 04/06/20-06/29/20    PT Start Time 1345    PT Stop Time 1429    PT Time Calculation (min) 44 min    Equipment Utilized During Treatment Gait belt    Activity Tolerance Patient tolerated treatment well;No increased pain    Behavior During Therapy WFL for tasks assessed/performed             Past Medical History:  Diagnosis Date   Abnormal Q waves on electrocardiogram    Anemia    Aortic atherosclerosis (HCC)    "I could possibly have it, my grandmother had it"   Cancer ([place]) 05/2011   Right upper Lung CA with partial Lobectomy.   Celiac disease    Celiac disease    Dyspnea    with exertion   Hyperlipidemia    Hyperlipidemia    Lung mass    Meningioma (HCC)    Osteoporosis    Personal history of chemotherapy    Personal history of radiation therapy     Past Surgical History:  Procedure Laterality Date   LUNG LOBECTOMY     right lung   VENTRICULOPERITONEAL SHUNT Right 07/03/2017   Procedure: SHUNT INSERTION VENTRICULAR-PERITONEAL;  Surgeon: Newman Pies, MD;  Location: [place];  Service: Neurosurgery;  Laterality: Right;    There were no vitals filed for this visit.   Subjective Assessment - 06/30/20 1558     Subjective Patient remembers today is goals day. No falls or LOB since last session.    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  Patient has attended OP PT in the past year for LLE strengthening and balance with positive results, but has not been in attendance recently due to COVID-19 pandemic. PMH includes osteoporosis, anemia, celiac disease, hyperlipidemia, hx of R upper lobe mass with resection 6/13, hx of pneumonia, meningioma, major atherosclerosis. VP shunt has been placed 06/2017.    Currently in Pain? No/denies                RECERT Goals:  Hip extension strength: perform 5 full height bridges 5x STS from on airex pad: 20. 6 seconds with SUE support: 13.9 seconds with BUE support  FOTO: 45%  6 MWT: 702 ft with rollator  New goal:  Patient will walk with SUE support for 10 ft for increased stability and independence with mobility.     treatment:  Stand with SUE support hedgehog taps; 10x each LE, near LOB when attempting with RLE. Opposite UE reaches 20x Attempt at single limb lifting with finger tip support from PT; unable to perform.  Seated opposite arm leg raise 10x each side.    Pt also educated throughout session in the form of VC/TC and demo to facilitate movement at target joints and correct muscle activation with exercises.   Patient demonstrates excellent progression towards functional goals at this time. She is able  to stand with SUE support with decreased time and is able to ambulate for further durations. New goals addressing single limb support ambulation as well as no upper extremity support static standing will be added. She continues to have episodic shuffle steppage and freezing that worsens as she fatigues however is able to self correct with occasional cueing from therapist. The patient continues to benefit from additional skilled PT services to improve gait pattern, balance and LE strength for improved quality of life               PT Education - 06/30/20 1559     Education provided Yes    Education Details goals, POC    Person(s) Educated Patient    Methods  Explanation;Demonstration;Tactile cues;Verbal cues    Comprehension Verbalized understanding;Returned demonstration;Verbal cues required;Tactile cues required              PT Short Term Goals - 06/30/20 1652       PT SHORT TERM GOAL #1   Title Pt to demonstrate improved hip extension strength AEB ability to perform 5 full height hooklying bridges.    Baseline unable to rise from chair hands free due to hip extension weakness 6/15: reqiures SUE support for STS    Time 4    Period Weeks    Status Partially Met    Target Date 07/28/20      PT SHORT TERM GOAL #2   Title Pt to demonstrate SLS balance >3 sec hands free to facilitate weight shift in gait and single limb gross motor coordination.    Baseline Requires modA for weightshift, unable to remain in SLS without BUE 4/4: unable to perform 5/11: 1-2 seconds    Time 4    Period Weeks    Status Partially Met    Target Date 07/28/20      PT SHORT TERM GOAL #3   Title Pt to demonstrate 5xSTS hands free from single airex pad in <20sec s LOB.    Baseline unable to rise from chair without BUE 4/4: 31.75 ft 6/15: 20.6 seconds with SUE support    Time 4    Period Weeks    Status Partially Met    Target Date 07/28/20               PT Long Term Goals - 06/30/20 1404       PT LONG TERM GOAL #1   Title Patient will increase FOTO score to equal to or greater than 65% to demonstrate statistically significant improvement in mobility and quality of life.    Baseline 10/20/19: 32% 11/24: 50.7%; 04/06/20 5/11: 45% 6/15: 45%    Time 12    Period Weeks    Status On-going    Target Date 09/22/20      PT LONG TERM GOAL #2   Title Patient will complete five times sit to stand hands free from chair + airex in < 15 seconds for increased coordination and ADL performance    Baseline Unable to rise from airex without use of hands 4/4: 31.75 seconds with no UE support 5/11: 17.23 seconds with BUE support 6/15: 20. 6 seconds with SUE support:  13.9 seconds with BUE support    Time 12    Period Weeks    Status Partially Met    Target Date 09/22/20      PT LONG TERM GOAL #3   Title Patient will walk with SUE support for 10 ft for increased stability and independence with  mobility.    Baseline 6/15: able to walk 2-3 ft with heavy SUE support and max cueing    Time 12    Period Weeks    Status New    Target Date 09/22/20      PT LONG TERM GOAL #4   Title Pt to tolerate 6MWT c 4WW, supervision level assessment >1053f to facilitate confidence and safety in limited community distance AMB.    Baseline Not performed yet. 4/4: 673 ft with rollator 5/11: 665 ft with rollator 6/15: 702 ft with rollator    Time 12    Period Weeks    Status Partially Met    Target Date 09/22/20      PT LONG TERM GOAL #5   Title Patient will static stand without UE support for 5 minutes for ADL performance and decreased reliance upon UE's.    Baseline --    Time 12    Period Weeks    Status New    Target Date 09/22/20                   Plan - 06/30/20 1701     Clinical Impression Statement Patient demonstrates excellent progression towards functional goals at this time. She is able to stand with SUE support with decreased time and is able to ambulate for further durations. New goals addressing single limb support ambulation as well as no upper extremity support static standing will be added. She continues to have episodic shuffle steppage and freezing that worsens as she fatigues however is able to self correct with occasional cueing from therapist. The patient continues to benefit from additional skilled PT services to improve gait pattern, balance and LE strength for improved quality of life    Personal Factors and Comorbidities Time since onset of injury/illness/exacerbation;Age;Comorbidity 3+;Past/Current Experience;Transportation    Comorbidities osteoporosis, anemia, hx cancer, hyperlipidemia    Examination-Activity Limitations  Bathing;Bed Mobility;Dressing;Reach Overhead;Stairs;Stand;Toileting;Locomotion Level;Squat;Transfers;Bend;Hygiene/Grooming;Carry;Sit    Examination-Participation Restrictions Community Activity;Driving;Shop;Yard Work;Cleaning;Meal Prep    Stability/Clinical Decision Making Evolving/Moderate complexity    Rehab Potential Good    PT Frequency 2x / week    PT Duration 8 weeks    PT Treatment/Interventions ADLs/Self Care Home Management;Aquatic Therapy;Cryotherapy;Electrical Stimulation;DME Instruction;Gait training;Stair training;Functional mobility training;Therapeutic activities;Therapeutic exercise;Balance training;Neuromuscular re-education;Patient/family education;Manual techniques;Passive range of motion;Dry needling;Spinal Manipulations;Joint Manipulations;Orthotic Fit/Training;Energy conservation    PT Next Visit Plan Reducing shuffling gait with turns. LLE strengthening.    PT Home Exercise Plan not administered this date    Consulted and Agree with Plan of Care Patient             Patient will benefit from skilled therapeutic intervention in order to improve the following deficits and impairments:  Abnormal gait, Decreased balance, Decreased mobility, Difficulty walking, Hypomobility, Decreased strength, Decreased knowledge of use of DME, Decreased coordination, Postural dysfunction, Impaired flexibility, Improper body mechanics, Impaired perceived functional ability, Decreased activity tolerance  Visit Diagnosis: Unsteadiness on feet  Abnormality of gait and mobility  Muscle weakness (generalized)     Problem List Patient Active Problem List   Diagnosis Date Noted   Cognitive deficits    Labile blood pressure    Acute blood loss anemia    Slow transit constipation    Vascular headache    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 [place], [place], [address] Phone: [phone]   Fax:  [phone]  Name: Krista Evans MRN: [id number] Date of Birth: 29-Nov-1945

## 2020-07-05 ENCOUNTER — Ambulatory Visit: Payer: PPO

## 2020-07-05 ENCOUNTER — Other Ambulatory Visit: Payer: Self-pay

## 2020-07-05 DIAGNOSIS — R269 Unspecified abnormalities of gait and mobility: Secondary | ICD-10-CM

## 2020-07-05 DIAGNOSIS — M6281 Muscle weakness (generalized): Secondary | ICD-10-CM

## 2020-07-05 DIAGNOSIS — R2681 Unsteadiness on feet: Secondary | ICD-10-CM

## 2020-07-05 NOTE — Therapy (Addendum)
[place] MAIN [facility] SERVICES [address] [place], [place], [address] Phone: [phone]   Fax:  [phone]  Physical Therapy Treatment  Patient Details  Name: Krista Evans MRN: [id number] Date of Birth: 26-Jul-1945 Referring Provider (PT): Ramonita Lab   Encounter Date: 07/05/2020   PT End of Session - 07/05/20 1350     Visit Number 27    Number of Visits 8    Date for PT Re-Evaluation 09/22/20    Authorization Type [employer];  8/10 PN 05/26/20    Authorization Time Period 02/10/20-04/06/20; 04/06/20-06/29/20    PT Start Time 1345    PT Stop Time 1429    PT Time Calculation (min) 44 min    Equipment Utilized During Treatment Gait belt    Activity Tolerance Patient tolerated treatment well;No increased pain    Behavior During Therapy WFL for tasks assessed/performed             Past Medical History:  Diagnosis Date   Abnormal Q waves on electrocardiogram    Anemia    Aortic atherosclerosis (HCC)    "I could possibly have it, my grandmother had it"   Cancer ([place]) 05/2011   Right upper Lung CA with partial Lobectomy.   Celiac disease    Celiac disease    Dyspnea    with exertion   Hyperlipidemia    Hyperlipidemia    Lung mass    Meningioma (HCC)    Osteoporosis    Personal history of chemotherapy    Personal history of radiation therapy     Past Surgical History:  Procedure Laterality Date   LUNG LOBECTOMY     right lung   VENTRICULOPERITONEAL SHUNT Right 07/03/2017   Procedure: SHUNT INSERTION VENTRICULAR-PERITONEAL;  Surgeon: Newman Pies, MD;  Location: [place];  Service: Neurosurgery;  Laterality: Right;    There were no vitals filed for this visit.   Subjective Assessment - 07/05/20 1350     Subjective Patient reports having a good fathers day. No falls or LOB since last session.    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  Patient has attended OP PT in the past year for LLE strengthening and balance with positive results, but has not been in attendance recently due to COVID-19 pandemic. PMH includes osteoporosis, anemia, celiac disease, hyperlipidemia, hx of R upper lobe mass with resection 6/13, hx of pneumonia, meningioma, major atherosclerosis. VP shunt has been placed 06/2017.    Limitations Lifting;House hold activities;Standing;Walking    How long can you sit comfortably? n/a    How long can you stand comfortably? ~10 minutes (AM better than PM)    How long can you walk comfortably? a few mins until standing rest break with LLE off ground    Currently in Pain? No/denies                neuro re-ed    SUE support march: (added to HEP) 10x each LE   Ambulate 7 ft with SUE support x 6 trials, max cueing for safety due to patient attempting to reach for armrest prior to safe region resulting in near LOB requiring PT to stabilize patient.   Seated:  Forward toe tap red cone 12x each LE for coordination and muscle activation.    Therex:  Nustep Lvl 3 seat position 9 RPM> 60 for cardiovascular support  Ambulate >700 ft with rollator and close CGA, patient requires cueing  for step length, reduction of freezing when changing rooms or approaching obstacles. Cues for negotiation of walker required for negotiation of obstacles in pathway   Seated:  Lateral step over cone 15x each LE ; requires focus on task for performance.   Forward toe tap red cone 12x each LE for coordination and muscle activation.  RTB hamstring curls 12x each LE  RTB around bilateral toes: df with march 10x each LE     Patient's vitals monitored throughout the session.  Pt educated throughout session about proper posture and technique with exercises. Improved exercise technique, movement at target joints, use of target muscles after min to mod verbal, visual, tactile cues    Patient is highly fearful of LOB and is challenged with  ambulating with decreased UE support at this time. She has excessive shuffle steppage and freezing with fatigue as well as with increased challenge.              Plan - 07/07/20 1408     Clinical Impression Statement Patient is highly motivated throughout physical therapy session. She continues to have shuffle steppage when she is fatigued and/or afraid of LOB. She is eager to progress strengthenign and stability to regain independence. SUE ambulation is challenging for patient at this time. The patient continues to benefit from additional skilled PT services to improve gait pattern, balance and LE strength for improved quality of life    Personal Factors and Comorbidities Time since onset of injury/illness/exacerbation;Age;Comorbidity 3+;Past/Current Experience;Transportation    Comorbidities osteoporosis, anemia, hx cancer, hyperlipidemia    Examination-Activity Limitations Bathing;Bed Mobility;Dressing;Reach Overhead;Stairs;Stand;Toileting;Locomotion Level;Squat;Transfers;Bend;Hygiene/Grooming;Carry;Sit    Examination-Participation Restrictions Community Activity;Driving;Shop;Yard Work;Cleaning;Meal Prep    Stability/Clinical Decision Making Evolving/Moderate complexity    Rehab Potential Good    PT Frequency 2x / week    PT Duration 8 weeks    PT Treatment/Interventions ADLs/Self Care Home Management;Aquatic Therapy;Cryotherapy;Electrical Stimulation;DME Instruction;Gait training;Stair training;Functional mobility training;Therapeutic activities;Therapeutic exercise;Balance training;Neuromuscular re-education;Patient/family education;Manual techniques;Passive range of motion;Dry needling;Spinal Manipulations;Joint Manipulations;Orthotic Fit/Training;Energy conservation    PT Next Visit Plan Reducing shuffling gait with turns. LLE strengthening.    PT Home Exercise Plan not administered this date    Consulted and Agree with Plan of Care Patient                     PT  Education - 07/05/20 1350     Education provided Yes    Education Details exercise technique, body mechanics    Person(s) Educated Patient    Methods Explanation;Demonstration;Tactile cues;Verbal cues    Comprehension Verbalized understanding;Returned demonstration;Verbal cues required;Tactile cues required              PT Short Term Goals - 06/30/20 1652       PT SHORT TERM GOAL #1   Title Pt to demonstrate improved hip extension strength AEB ability to perform 5 full height hooklying bridges.    Baseline unable to rise from chair hands free due to hip extension weakness 6/15: reqiures SUE support for STS    Time 4    Period Weeks    Status Partially Met    Target Date 07/28/20      PT SHORT TERM GOAL #2   Title Pt to demonstrate SLS balance >3 sec hands free to facilitate weight shift in gait and single limb gross motor coordination.    Baseline Requires modA for weightshift, unable to remain in SLS without BUE 4/4: unable to perform 5/11: 1-2 seconds    Time 4  Period Weeks    Status Partially Met    Target Date 07/28/20      PT SHORT TERM GOAL #3   Title Pt to demonstrate 5xSTS hands free from single airex pad in <20sec s LOB.    Baseline unable to rise from chair without BUE 4/4: 31.75 ft 6/15: 20.6 seconds with SUE support    Time 4    Period Weeks    Status Partially Met    Target Date 07/28/20               PT Long Term Goals - 06/30/20 1404       PT LONG TERM GOAL #1   Title Patient will increase FOTO score to equal to or greater than 65% to demonstrate statistically significant improvement in mobility and quality of life.    Baseline 10/20/19: 32% 11/24: 50.7%; 04/06/20 5/11: 45% 6/15: 45%    Time 12    Period Weeks    Status On-going    Target Date 09/22/20      PT LONG TERM GOAL #2   Title Patient will complete five times sit to stand hands free from chair + airex in < 15 seconds for increased coordination and ADL performance    Baseline Unable  to rise from airex without use of hands 4/4: 31.75 seconds with no UE support 5/11: 17.23 seconds with BUE support 6/15: 20. 6 seconds with SUE support: 13.9 seconds with BUE support    Time 12    Period Weeks    Status Partially Met    Target Date 09/22/20      PT LONG TERM GOAL #3   Title Patient will walk with SUE support for 10 ft for increased stability and independence with mobility.    Baseline 6/15: able to walk 2-3 ft with heavy SUE support and max cueing    Time 12    Period Weeks    Status New    Target Date 09/22/20      PT LONG TERM GOAL #4   Title Pt to tolerate 6MWT c 4WW, supervision level assessment >1035f to facilitate confidence and safety in limited community distance AMB.    Baseline Not performed yet. 4/4: 673 ft with rollator 5/11: 665 ft with rollator 6/15: 702 ft with rollator    Time 12    Period Weeks    Status Partially Met    Target Date 09/22/20      PT LONG TERM GOAL #5   Title Patient will static stand without UE support for 5 minutes for ADL performance and decreased reliance upon UE's.    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 [place], [place], [address] Phone: [phone]   Fax:  [phone]  Name: RIHAM POLYAKOV MRN: [id number] Date of Birth: 1945-07-09

## 2020-07-07 ENCOUNTER — Ambulatory Visit: Payer: PPO

## 2020-07-07 ENCOUNTER — Other Ambulatory Visit: Payer: Self-pay

## 2020-07-07 DIAGNOSIS — R2681 Unsteadiness on feet: Secondary | ICD-10-CM

## 2020-07-07 DIAGNOSIS — M6281 Muscle weakness (generalized): Secondary | ICD-10-CM

## 2020-07-07 DIAGNOSIS — R269 Unspecified abnormalities of gait and mobility: Secondary | ICD-10-CM

## 2020-07-07 NOTE — Therapy (Signed)
[place] MAIN [facility] SERVICES [address] [place], [place], [address] Phone: [phone]   Fax:  [phone]  Physical Therapy Treatment  Patient Details  Name: Krista Evans MRN: [id number] Date of Birth: 18-Oct-1945 Referring Provider (PT): Ramonita Lab   Encounter Date: 07/07/2020   PT End of Session - 07/07/20 1352     Visit Number 25    Number of Visits 47    Date for PT Re-Evaluation 09/22/20    Authorization Type [employer];  9/10 PN 05/26/20    Authorization Time Period 02/10/20-04/06/20; 04/06/20-06/29/20    PT Start Time 1345    PT Stop Time 1429    PT Time Calculation (min) 44 min    Equipment Utilized During Treatment Gait belt    Activity Tolerance Patient tolerated treatment well;No increased pain    Behavior During Therapy WFL for tasks assessed/performed             Past Medical History:  Diagnosis Date   Abnormal Q waves on electrocardiogram    Anemia    Aortic atherosclerosis (HCC)    "I could possibly have it, my grandmother had it"   Cancer ([place]) 05/2011   Right upper Lung CA with partial Lobectomy.   Celiac disease    Celiac disease    Dyspnea    with exertion   Hyperlipidemia    Hyperlipidemia    Lung mass    Meningioma (HCC)    Osteoporosis    Personal history of chemotherapy    Personal history of radiation therapy     Past Surgical History:  Procedure Laterality Date   LUNG LOBECTOMY     right lung   VENTRICULOPERITONEAL SHUNT Right 07/03/2017   Procedure: SHUNT INSERTION VENTRICULAR-PERITONEAL;  Surgeon: Newman Pies, MD;  Location: [place];  Service: Neurosurgery;  Laterality: Right;    There were no vitals filed for this visit.   Subjective Assessment - 07/07/20 1352     Subjective Patient reports compliance with HEP. No falls or LOB since last session.    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 Patient  has attended OP PT in the past year for LLE strengthening and balance with positive results, but has not been in attendance recently due to COVID-19 pandemic. PMH includes osteoporosis, anemia, celiac disease, hyperlipidemia, hx of R upper lobe mass with resection 6/13, hx of pneumonia, meningioma, major atherosclerosis. VP shunt has been placed 06/2017.    Limitations Lifting;House hold activities;Standing;Walking    How long can you sit comfortably? n/a    How long can you stand comfortably? ~10 minutes (AM better than PM)    How long can you walk comfortably? a few mins until standing rest break with LLE off ground    Currently in Pain? No/denies                 neuro re-ed   Standing on LLE only with BUE support 30 seconds  Ambulate 7 ft with SUE support from chair to/from railing on wall x 3 trials (there and back), max cueing for safety due to patient attempting to reach for armrest prior to safe region resulting in near LOB requiring PT to stabilize patient.    Seated: Seated soccer ball kicks for coordination, spatial awareness and sequencing LLE x 14 kicks   Therex:  Ambulate 96 ft with RW with cues for foot clearance and decreased episodes of freezing with close  CGA  Standing with # 3lb ankle weight: CGA for stability   -Hip extension with bilateral upper extremity support, cueing for neutral hip alignment, upright posture for optimal muscle recruitment, and sequencing, 10x each LE x2 sets, -Hip abduction with bilateral upper extremity support, cueing for neutral foot alignment for correct muscle activation, 10x each LE -Hip flexion with single upper extremity support, cueing for body mechanics, speed of muscle recruitment for optimal strengthening and stabilization 10x each LE; very challenging to pick up RLE   -BLE heel raises with no rail assist, min A for safety and mod VCS for proper positioning including to keep knee extended to isolate calf muscle activation;      Seated with # 3lb  ankle weights -Seated marches with upright posture, back away from back of chair for abdominal/trunk activation/stabilization, 15x each LE; SUE on chair for stabilization  -Seated LAQ with 3 second holds, 15x each LE, cueing to increase ankle DF for better stretch and ROM;  -seated alternating ER/IR 15x each LE  Seated:  RTB hamstring curls 12x each LE       Patient's vitals monitored throughout the session.  Pt educated throughout session about proper posture and technique with exercises. Improved exercise technique, movement at target joints, use of target muscles after min to mod verbal, visual, tactile cues       Patient is highly fearful of LOB and is challenged with ambulating with decreased UE support at this time. She has excessive shuffle steppage and freezing with fatigue as well as with increased challenge.    Patient is challenged with RLE clearance due to limited weight acceptance onto affected LLE. She is highly motivated despite fear of instability. She is able to ambulate a short distance with SUE support this session. The patient continues to benefit from additional skilled PT services to improve gait pattern, balance and LE strength for improved quality of life                   PT Education - 07/07/20 1352     Education provided Yes    Education Details exercise technique, body mechanics    Person(s) Educated Patient    Methods Explanation;Demonstration;Tactile cues;Verbal cues    Comprehension Verbalized understanding;Returned demonstration;Verbal cues required;Tactile cues required              PT Short Term Goals - 06/30/20 1652       PT SHORT TERM GOAL #1   Title Pt to demonstrate improved hip extension strength AEB ability to perform 5 full height hooklying bridges.    Baseline unable to rise from chair hands free due to hip extension weakness 6/15: reqiures SUE support for STS    Time 4    Period Weeks    Status  Partially Met    Target Date 07/28/20      PT SHORT TERM GOAL #2   Title Pt to demonstrate SLS balance >3 sec hands free to facilitate weight shift in gait and single limb gross motor coordination.    Baseline Requires modA for weightshift, unable to remain in SLS without BUE 4/4: unable to perform 5/11: 1-2 seconds    Time 4    Period Weeks    Status Partially Met    Target Date 07/28/20      PT SHORT TERM GOAL #3   Title Pt to demonstrate 5xSTS hands free from single airex pad in <20sec s LOB.    Baseline unable to rise from chair  without BUE 4/4: 31.75 ft 6/15: 20.6 seconds with SUE support    Time 4    Period Weeks    Status Partially Met    Target Date 07/28/20               PT Long Term Goals - 06/30/20 1404       PT LONG TERM GOAL #1   Title Patient will increase FOTO score to equal to or greater than 65% to demonstrate statistically significant improvement in mobility and quality of life.    Baseline 10/20/19: 32% 11/24: 50.7%; 04/06/20 5/11: 45% 6/15: 45%    Time 12    Period Weeks    Status On-going    Target Date 09/22/20      PT LONG TERM GOAL #2   Title Patient will complete five times sit to stand hands free from chair + airex in < 15 seconds for increased coordination and ADL performance    Baseline Unable to rise from airex without use of hands 4/4: 31.75 seconds with no UE support 5/11: 17.23 seconds with BUE support 6/15: 20. 6 seconds with SUE support: 13.9 seconds with BUE support    Time 12    Period Weeks    Status Partially Met    Target Date 09/22/20      PT LONG TERM GOAL #3   Title Patient will walk with SUE support for 10 ft for increased stability and independence with mobility.    Baseline 6/15: able to walk 2-3 ft with heavy SUE support and max cueing    Time 12    Period Weeks    Status New    Target Date 09/22/20      PT LONG TERM GOAL #4   Title Pt to tolerate 6MWT c 4WW, supervision level assessment >1043f to facilitate confidence  and safety in limited community distance AMB.    Baseline Not performed yet. 4/4: 673 ft with rollator 5/11: 665 ft with rollator 6/15: 702 ft with rollator    Time 12    Period Weeks    Status Partially Met    Target Date 09/22/20      PT LONG TERM GOAL #5   Title Patient will static stand without UE support for 5 minutes for ADL performance and decreased reliance upon UE's.    Baseline --    Time 12    Period Weeks    Status New    Target Date 09/22/20                   Plan - 07/07/20 1411     Clinical Impression Statement Patient is challenged with RLE clearance due to limited weight acceptance onto affected LLE. She is highly motivated despite fear of instability. She is able to ambulate a short distance with SUE support this session. The patient continues to benefit from additional skilled PT services to improve gait pattern, balance and LE strength for improved quality of life    Personal Factors and Comorbidities Time since onset of injury/illness/exacerbation;Age;Comorbidity 3+;Past/Current Experience;Transportation    Comorbidities osteoporosis, anemia, hx cancer, hyperlipidemia    Examination-Activity Limitations Bathing;Bed Mobility;Dressing;Reach Overhead;Stairs;Stand;Toileting;Locomotion Level;Squat;Transfers;Bend;Hygiene/Grooming;Carry;Sit    Examination-Participation Restrictions Community Activity;Driving;Shop;Yard Work;Cleaning;Meal Prep    Stability/Clinical Decision Making Evolving/Moderate complexity    Rehab Potential Good    PT Frequency 2x / week    PT Duration 8 weeks    PT Treatment/Interventions ADLs/Self Care Home Management;Aquatic Therapy;Cryotherapy;Electrical Stimulation;DME Instruction;Gait training;Stair training;Functional mobility training;Therapeutic activities;Therapeutic exercise;Balance training;Neuromuscular  re-education;Patient/family education;Manual techniques;Passive range of motion;Dry needling;Spinal Manipulations;Joint  Manipulations;Orthotic Fit/Training;Energy conservation    PT Next Visit Plan Reducing shuffling gait with turns. LLE strengthening.    PT Home Exercise Plan not administered this date    Consulted and Agree with Plan of Care Patient             Patient will benefit from skilled therapeutic intervention in order to improve the following deficits and impairments:  Abnormal gait, Decreased balance, Decreased mobility, Difficulty walking, Hypomobility, Decreased strength, Decreased knowledge of use of DME, Decreased coordination, Postural dysfunction, Impaired flexibility, Improper body mechanics, Impaired perceived functional ability, Decreased activity tolerance  Visit Diagnosis: Unsteadiness on feet  Abnormality of gait and mobility  Muscle weakness (generalized)     Problem List Patient Active Problem List   Diagnosis Date Noted   Cognitive deficits    Labile blood pressure    Acute blood loss anemia    Slow transit constipation    Vascular headache    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   Hydrocephalus ([place]) 07/03/2017   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

## 2020-07-14 ENCOUNTER — Ambulatory Visit: Payer: PPO

## 2020-07-20 ENCOUNTER — Ambulatory Visit: Payer: PPO

## 2020-07-26 ENCOUNTER — Ambulatory Visit: Payer: PPO

## 2020-07-27 DIAGNOSIS — E441 Mild protein-calorie malnutrition: Secondary | ICD-10-CM | POA: Diagnosis not present

## 2020-07-27 DIAGNOSIS — I700 Atherosclerosis of aorta: Secondary | ICD-10-CM | POA: Diagnosis not present

## 2020-07-27 DIAGNOSIS — E7849 Other hyperlipidemia: Secondary | ICD-10-CM | POA: Diagnosis not present

## 2020-07-27 DIAGNOSIS — Z85118 Personal history of other malignant neoplasm of bronchus and lung: Secondary | ICD-10-CM | POA: Diagnosis not present

## 2020-07-28 ENCOUNTER — Other Ambulatory Visit: Payer: Self-pay

## 2020-07-28 ENCOUNTER — Ambulatory Visit: Payer: PPO | Attending: Internal Medicine

## 2020-07-28 DIAGNOSIS — R2681 Unsteadiness on feet: Secondary | ICD-10-CM | POA: Diagnosis not present

## 2020-07-28 DIAGNOSIS — R2689 Other abnormalities of gait and mobility: Secondary | ICD-10-CM | POA: Insufficient documentation

## 2020-07-28 DIAGNOSIS — M6281 Muscle weakness (generalized): Secondary | ICD-10-CM | POA: Insufficient documentation

## 2020-07-28 DIAGNOSIS — R269 Unspecified abnormalities of gait and mobility: Secondary | ICD-10-CM | POA: Diagnosis not present

## 2020-07-28 DIAGNOSIS — R278 Other lack of coordination: Secondary | ICD-10-CM | POA: Diagnosis not present

## 2020-07-28 NOTE — Therapy (Signed)
[place] MAIN [facility] SERVICES [address] [place], [place], [address] Phone: [phone]   Fax:  [phone]  Physical Therapy Treatment Physical Therapy Progress Note   Dates of reporting period  05/26/20   to   07/28/20   Patient Details  Name: Krista Evans MRN: [id number] Date of Birth: Sep 08, 1945 Referring Provider (PT): Ramonita Lab   Encounter Date: 07/28/2020   PT End of Session - 07/28/20 1034     Visit Number 60    Number of Visits 4    Date for PT Re-Evaluation 09/22/20    Authorization Type [employer];  10/10 PN 05/26/20; next session 1/10 PN 07/28/20    Authorization Time Period 02/10/20-04/06/20; 04/06/20-06/29/20    PT Start Time 1013    PT Stop Time 1058    PT Time Calculation (min) 45 min    Equipment Utilized During Treatment Gait belt    Activity Tolerance Patient tolerated treatment well;No increased pain    Behavior During Therapy WFL for tasks assessed/performed             Past Medical History:  Diagnosis Date   Abnormal Q waves on electrocardiogram    Anemia    Aortic atherosclerosis (HCC)    "I could possibly have it, my grandmother had it"   Cancer ([place]) 05/2011   Right upper Lung CA with partial Lobectomy.   Celiac disease    Celiac disease    Dyspnea    with exertion   Hyperlipidemia    Hyperlipidemia    Lung mass    Meningioma (HCC)    Osteoporosis    Personal history of chemotherapy    Personal history of radiation therapy     Past Surgical History:  Procedure Laterality Date   LUNG LOBECTOMY     right lung   VENTRICULOPERITONEAL SHUNT Right 07/03/2017   Procedure: SHUNT INSERTION VENTRICULAR-PERITONEAL;  Surgeon: Newman Pies, MD;  Location: [place];  Service: Neurosurgery;  Laterality: Right;    There were no vitals filed for this visit.   Subjective Assessment - 07/28/20 1054     Subjective Hasn't been seen in three weeks. Patient had an accident getting to the toilet  twice since she was seen last. First time was the week before the fourth and the second time the Saturday before the fourth. Lost balance and hit the stoppers with her back, and was very sore. Patient took two ibuprofin today for her pain in her back. Did not go to the doctor for her pain.    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 Patient has attended OP PT in the past year for LLE strengthening and balance with positive results, but has not been in attendance recently due to COVID-19 pandemic. PMH includes osteoporosis, anemia, celiac disease, hyperlipidemia, hx of R upper lobe mass with resection 6/13, hx of pneumonia, meningioma, major atherosclerosis. VP shunt has been placed 06/2017.    Limitations Lifting;House hold activities;Standing;Walking    How long can you sit comfortably? n/a    How long can you stand comfortably? ~10 minutes (AM better than PM)    How long can you walk comfortably? a few mins until standing rest break with LLE off ground    Currently in Pain? No/denies                Hasn't been seen in three weeks. Patient had an accident getting to the toilet twice  since she was seen last. First time was the week before the fourth and the second time the Saturday before the fourth. Lost balance and hit the stoppers with her back, and was very sore. Patient took two ibuprofin today for her pain in her back. Did not go to the doctor for her pain.   Goals:  SLS: unable to perform   5x STS from airex pad: 18 seconds with heavy BUE support ; 24 seconds with SUE with two near LOB (decreased) FOTO: 51% Walk with SUE support; able to perform with SUE (LUE) support 3 ft.  6 MWT : 545 ft with RW very fatiguing frequent standing rest breaks  Static stand 5 minutes: 1 min 36 seconds    Treatment:  seated RTB hamstring curl 10x each LE  seated RTB around bilateral ankles: alternating LAQ 10x each LE     Patient's condition has the potential to improve in response to therapy. Maximum improvement is yet to be obtained. The anticipated improvement is attainable and reasonable in a generally predictable time.  Patient reports she has lost strength since missing therapy.    Patient has been absent from therapy for three weeks. Since she has been absent her ambulation and transfers have declined indicating need for therapy for progression and to decrease fall risk. Patient had two falls since last seen. Patient's condition has the potential to improve in response to therapy. Maximum improvement is yet to be obtained. The anticipated improvement is attainable and reasonable in a generally predictable time.  The patient continues to benefit from additional skilled PT services to improve gait pattern, balance and LE strength for improved quality of life               PT Education - 07/28/20 1048     Education provided Yes    Education Details goals, compliance of therapy    Person(s) Educated Patient    Methods Explanation;Demonstration;Tactile cues;Verbal cues    Comprehension Verbalized understanding;Returned demonstration;Verbal cues required;Tactile cues required              PT Short Term Goals - 07/28/20 1033       PT SHORT TERM GOAL #1   Title Pt to demonstrate improved hip extension strength AEB ability to perform 5 full height hooklying bridges.    Baseline unable to rise from chair hands free due to hip extension weakness 6/15: reqiures SUE support for STS 7/13: didnt perform due to back pain    Time 4    Period Weeks    Status Partially Met    Target Date 07/28/20      PT SHORT TERM GOAL #2   Title Pt to demonstrate SLS balance >3 sec hands free to facilitate weight shift in gait and single limb gross motor coordination.    Baseline Requires modA for weightshift, unable to remain in SLS without BUE 4/4: unable to perform 5/11: 1-2 seconds 7/13: unable to perform     Time 4    Period Weeks    Status On-going    Target Date 07/28/20      PT SHORT TERM GOAL #3   Title Pt to demonstrate 5xSTS hands free from single airex pad in <20sec s LOB.    Baseline unable to rise from chair without BUE 4/4: 31.75 ft 6/15: 20.6 seconds with SUE support 7/13: 18 seconds with heavy BUE support ; 24 seconds    Time 4    Period Weeks    Status Partially  Met    Target Date 07/28/20               PT Long Term Goals - 07/28/20 1030       PT LONG TERM GOAL #1   Title Patient will increase FOTO score to equal to or greater than 65% to demonstrate statistically significant improvement in mobility and quality of life.    Baseline 10/20/19: 32% 11/24: 50.7%; 04/06/20 5/11: 45% 6/15: 45% 7/13: 51%    Time 12    Period Weeks    Status On-going    Target Date 09/22/20      PT LONG TERM GOAL #2   Title Patient will complete five times sit to stand hands free from chair + airex in < 15 seconds for increased coordination and ADL performance    Baseline Unable to rise from airex without use of hands 4/4: 31.75 seconds with no UE support 5/11: 17.23 seconds with BUE support 6/15: 20. 6 seconds with SUE support: 13.9 seconds with BUE support 7/13: 18 seconds with heavy BUE support ; 24 seconds with SUE with two near LOB (decreased)    Time 12    Period Weeks    Status On-going    Target Date 09/22/20      PT LONG TERM GOAL #3   Title Patient will walk with SUE support for 10 ft for increased stability and independence with mobility.    Baseline 6/15: able to walk 2-3 ft with heavy SUE support and max cueing 7/13: SUE (LUE) support 3 ft.    Time 12    Period Weeks    Status On-going    Target Date 09/22/20      PT LONG TERM GOAL #4   Title Pt to tolerate 6MWT c 4WW, supervision level assessment >1033f to facilitate confidence and safety in limited community distance AMB.    Baseline Not performed yet. 4/4: 673 ft with rollator 5/11: 665 ft with rollator 6/15: 702 ft  with rollator 7/13: 545 ft with frequent standing rest breaks    Time 12    Period Weeks    Status Partially Met    Target Date 09/22/20      PT LONG TERM GOAL #5   Title Patient will static stand without UE support for 5 minutes for ADL performance and decreased reliance upon UE's.    Baseline 7/13/ 1 min 36 seconds    Time 12    Period Weeks    Status Partially Met    Target Date 09/22/20                   Plan - 07/28/20 1101     Clinical Impression Statement Patient has been absent from therapy for three weeks. Since she has been absent her ambulation and transfers have declined indicating need for therapy for progression and to decrease fall risk. Patient had two falls since last seen. ?Patient's condition has the potential to improve in response to therapy. Maximum improvement is yet to be obtained. The anticipated improvement is attainable and reasonable in a generally predictable time.  The patient continues to benefit from additional skilled PT services to improve gait pattern, balance and LE strength for improved quality of life    Personal Factors and Comorbidities Time since onset of injury/illness/exacerbation;Age;Comorbidity 3+;Past/Current Experience;Transportation    Comorbidities osteoporosis, anemia, hx cancer, hyperlipidemia    Examination-Activity Limitations Bathing;Bed Mobility;Dressing;Reach Overhead;Stairs;Stand;Toileting;Locomotion Level;Squat;Transfers;Bend;Hygiene/Grooming;Carry;Sit    Examination-Participation Restrictions Community Activity;Driving;Shop;Yard Work;Cleaning;Meal Prep  Stability/Clinical Decision Making Evolving/Moderate complexity    Rehab Potential Good    PT Frequency 2x / week    PT Duration 8 weeks    PT Treatment/Interventions ADLs/Self Care Home Management;Aquatic Therapy;Cryotherapy;Electrical Stimulation;DME Instruction;Gait training;Stair training;Functional mobility training;Therapeutic activities;Therapeutic exercise;Balance  training;Neuromuscular re-education;Patient/family education;Manual techniques;Passive range of motion;Dry needling;Spinal Manipulations;Joint Manipulations;Orthotic Fit/Training;Energy conservation    PT Next Visit Plan Reducing shuffling gait with turns. LLE strengthening.    PT Home Exercise Plan not administered this date    Consulted and Agree with Plan of Care Patient             Patient will benefit from skilled therapeutic intervention in order to improve the following deficits and impairments:  Abnormal gait, Decreased balance, Decreased mobility, Difficulty walking, Hypomobility, Decreased strength, Decreased knowledge of use of DME, Decreased coordination, Postural dysfunction, Impaired flexibility, Improper body mechanics, Impaired perceived functional ability, Decreased activity tolerance  Visit Diagnosis: Unsteadiness on feet  Abnormality of gait and mobility  Muscle weakness (generalized)     Problem List Patient Active Problem List   Diagnosis Date Noted   Cognitive deficits    Labile blood pressure    Acute blood loss anemia    Slow transit constipation    Vascular headache    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 [place], [place], [address] Phone: [phone]   Fax:  [phone]  Name: OBERIA BEAUDOIN MRN: [id number] Date of Birth: 1945/11/07

## 2020-08-02 ENCOUNTER — Ambulatory Visit: Payer: PPO

## 2020-08-02 ENCOUNTER — Other Ambulatory Visit: Payer: Self-pay

## 2020-08-02 DIAGNOSIS — R2681 Unsteadiness on feet: Secondary | ICD-10-CM

## 2020-08-02 DIAGNOSIS — R269 Unspecified abnormalities of gait and mobility: Secondary | ICD-10-CM

## 2020-08-02 DIAGNOSIS — M6281 Muscle weakness (generalized): Secondary | ICD-10-CM

## 2020-08-02 NOTE — Therapy (Signed)
[place] MAIN [facility] SERVICES [address] [place], [place], [address] Phone: [phone]   Fax:  [phone]  Physical Therapy Treatment  Patient Details  Name: Krista Evans MRN: [id number] Date of Birth: [birth year] Referring Provider (PT): Ramonita Lab   Encounter Date: 08/02/2020   PT End of Session - 08/02/20 1349     Visit Number 61    Number of Visits 81    Date for PT Re-Evaluation 09/22/20    Authorization Type 1/10 PN 07/28/20    Authorization Time Period 02/10/20-04/06/20; 04/06/20-06/29/20    PT Start Time 1345    PT Stop Time 1429    PT Time Calculation (min) 44 min    Equipment Utilized During Treatment Gait belt    Activity Tolerance Patient tolerated treatment well;No increased pain    Behavior During Therapy WFL for tasks assessed/performed             Past Medical History:  Diagnosis Date   Abnormal Q waves on electrocardiogram    Anemia    Aortic atherosclerosis (HCC)    "I could possibly have it, my grandmother had it"   Cancer ([place]) 05/2011   Right upper Lung CA with partial Lobectomy.   Celiac disease    Celiac disease    Dyspnea    with exertion   Hyperlipidemia    Hyperlipidemia    Lung mass    Meningioma (HCC)    Osteoporosis    Personal history of chemotherapy    Personal history of radiation therapy     Past Surgical History:  Procedure Laterality Date   LUNG LOBECTOMY     right lung   VENTRICULOPERITONEAL SHUNT Right 07/03/2017   Procedure: SHUNT INSERTION VENTRICULAR-PERITONEAL;  Surgeon: Newman Pies, MD;  Location: [place];  Service: Neurosurgery;  Laterality: Right;    There were no vitals filed for this visit.   Subjective Assessment - 08/02/20 1348     Subjective Patient reports she has been exercising since her last session.    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 Patient has attended OP PT in the past  year for LLE strengthening and balance with positive results, but has not been in attendance recently due to COVID-19 pandemic. PMH includes osteoporosis, anemia, celiac disease, hyperlipidemia, hx of R upper lobe mass with resection 6/13, hx of pneumonia, meningioma, major atherosclerosis. VP shunt has been placed 06/2017.    Limitations Lifting;House hold activities;Standing;Walking    How long can you sit comfortably? n/a    How long can you stand comfortably? ~10 minutes (AM better than PM)    How long can you walk comfortably? a few mins until standing rest break with LLE off ground    Currently in Pain? No/denies                     neuro re-ed   Standing on LLE only with BUE support 30 seconds   Ambulate 7 ft with SUE support from chair to/from railing on wall x 3 trials (there and back), max cueing for safety due to patient attempting to reach for armrest prior to safe region resulting in near LOB requiring PT to stabilize patient.     Seated: Seated soccer ball kicks for coordination, spatial awareness and sequencing LLE x 14 kicks   Therex:  Nustep Lvl 3 seat position 9, L arm 11, R arm 10, 4 minutes for cardiovascular  challenge.   Ambulate 390 ft with RW with cues for foot clearance and decreased episodes of freezing with close CGA. Patient challenged with turns, people passing her, changing surfaces, and fatigue.    Standing with # 3lb ankle weight: CGA for stability   -Hip extension with bilateral upper extremity support, cueing for neutral hip alignment, upright posture for optimal muscle recruitment, and sequencing, 10x each LE x -Hip abduction with bilateral upper extremity support, cueing for neutral foot alignment for correct muscle activation, 10x each LE -Hip flexion with single upper extremity support, cueing for body mechanics, speed of muscle recruitment for optimal strengthening and stabilization 10x each LE; very challenging to pick up RLE  -hamstring  curl with BUE support 10x each LE, cue for proper body mechanics    Seated with # 3lb  ankle weights -Seated marches with upright posture, back away from back of chair for abdominal/trunk activation/stabilization, 15x each LE; SUE on chair for stabilization  -Seated LAQ with 3 second holds, 10x each LE, cueing to increase ankle DF for better stretch and ROM;  -seated alternating ER/IR 15x each LE -seated heel raise 15x     seated: Theraband taps for 30 seconds; for coordination, spatial awareness, and sequencing of muscle activation.     Patient's vitals monitored throughout the session.  Pt educated throughout session about proper posture and technique with exercises. Improved exercise technique, movement at target joints, use of target muscles after min to mod verbal, visual, tactile cues     Patient's rollator corrected for proper body mechanics with handles raised. Patient reports improved ability to ambulate after changes. She is highly motivated despite fatigue. Prolonged mobility is limited at this time and will be an area of continued focus now that patient has returned to therapy. The patient continues to benefit from additional skilled PT services to improve gait pattern, balance and LE strength for improved quality of life               PT Education - 08/02/20 1348     Education provided Yes    Education Details exercise technique, body mechanics    Person(s) Educated Patient    Methods Explanation;Demonstration;Tactile cues;Verbal cues    Comprehension Verbalized understanding;Returned demonstration;Verbal cues required;Tactile cues required              PT Short Term Goals - 07/28/20 1033       PT SHORT TERM GOAL #1   Title Pt to demonstrate improved hip extension strength AEB ability to perform 5 full height hooklying bridges.    Baseline unable to rise from chair hands free due to hip extension weakness 6/15: reqiures SUE support for STS 7/13: didnt  perform due to back pain    Time 4    Period Weeks    Status Partially Met    Target Date 07/28/20      PT SHORT TERM GOAL #2   Title Pt to demonstrate SLS balance >3 sec hands free to facilitate weight shift in gait and single limb gross motor coordination.    Baseline Requires modA for weightshift, unable to remain in SLS without BUE 4/4: unable to perform 5/11: 1-2 seconds 7/13: unable to perform    Time 4    Period Weeks    Status On-going    Target Date 07/28/20      PT SHORT TERM GOAL #3   Title Pt to demonstrate 5xSTS hands free from single airex pad in <20sec s LOB.  Baseline unable to rise from chair without BUE 4/4: 31.75 ft 6/15: 20.6 seconds with SUE support 7/13: 18 seconds with heavy BUE support ; 24 seconds    Time 4    Period Weeks    Status Partially Met    Target Date 07/28/20               PT Long Term Goals - 07/28/20 1030       PT LONG TERM GOAL #1   Title Patient will increase FOTO score to equal to or greater than 65% to demonstrate statistically significant improvement in mobility and quality of life.    Baseline 10/20/19: 32% 11/24: 50.7%; 04/06/20 5/11: 45% 6/15: 45% 7/13: 51%    Time 12    Period Weeks    Status On-going    Target Date 09/22/20      PT LONG TERM GOAL #2   Title Patient will complete five times sit to stand hands free from chair + airex in < 15 seconds for increased coordination and ADL performance    Baseline Unable to rise from airex without use of hands 4/4: 31.75 seconds with no UE support 5/11: 17.23 seconds with BUE support 6/15: 20. 6 seconds with SUE support: 13.9 seconds with BUE support 7/13: 18 seconds with heavy BUE support ; 24 seconds with SUE with two near LOB (decreased)    Time 12    Period Weeks    Status On-going    Target Date 09/22/20      PT LONG TERM GOAL #3   Title Patient will walk with SUE support for 10 ft for increased stability and independence with mobility.    Baseline 6/15: able to walk 2-3 ft  with heavy SUE support and max cueing 7/13: SUE (LUE) support 3 ft.    Time 12    Period Weeks    Status On-going    Target Date 09/22/20      PT LONG TERM GOAL #4   Title Pt to tolerate 6MWT c 4WW, supervision level assessment >1028f to facilitate confidence and safety in limited community distance AMB.    Baseline Not performed yet. 4/4: 673 ft with rollator 5/11: 665 ft with rollator 6/15: 702 ft with rollator 7/13: 545 ft with frequent standing rest breaks    Time 12    Period Weeks    Status Partially Met    Target Date 09/22/20      PT LONG TERM GOAL #5   Title Patient will static stand without UE support for 5 minutes for ADL performance and decreased reliance upon UE's.    Baseline 7/13/ 1 min 36 seconds    Time 12    Period Weeks    Status Partially Met    Target Date 09/22/20                   Plan - 08/02/20 1420     Clinical Impression Statement Patient's rollator corrected for proper body mechanics with handles raised. Patient reports improved ability to ambulate after changes. She is highly motivated despite fatigue. Prolonged mobility is limited at this time and will be an area of continued focus now that patient has returned to therapy. The patient continues to benefit from additional skilled PT services to improve gait pattern, balance and LE strength for improved quality of life    Personal Factors and Comorbidities Time since onset of injury/illness/exacerbation;Age;Comorbidity 3+;Past/Current Experience;Transportation    Comorbidities osteoporosis, anemia, hx cancer, hyperlipidemia  Examination-Activity Limitations Bathing;Bed Mobility;Dressing;Reach Overhead;Stairs;Stand;Toileting;Locomotion Level;Squat;Transfers;Bend;Hygiene/Grooming;Carry;Sit    Examination-Participation Restrictions Community Activity;Driving;Shop;Yard Work;Cleaning;Meal Prep    Stability/Clinical Decision Making Evolving/Moderate complexity    Rehab Potential Good    PT Frequency  2x / week    PT Duration 8 weeks    PT Treatment/Interventions ADLs/Self Care Home Management;Aquatic Therapy;Cryotherapy;Electrical Stimulation;DME Instruction;Gait training;Stair training;Functional mobility training;Therapeutic activities;Therapeutic exercise;Balance training;Neuromuscular re-education;Patient/family education;Manual techniques;Passive range of motion;Dry needling;Spinal Manipulations;Joint Manipulations;Orthotic Fit/Training;Energy conservation    PT Next Visit Plan Reducing shuffling gait with turns. LLE strengthening.    PT Home Exercise Plan not administered this date    Consulted and Agree with Plan of Care Patient             Patient will benefit from skilled therapeutic intervention in order to improve the following deficits and impairments:  Abnormal gait, Decreased balance, Decreased mobility, Difficulty walking, Hypomobility, Decreased strength, Decreased knowledge of use of DME, Decreased coordination, Postural dysfunction, Impaired flexibility, Improper body mechanics, Impaired perceived functional ability, Decreased activity tolerance  Visit Diagnosis: Unsteadiness on feet  Abnormality of gait and mobility  Muscle weakness (generalized)     Problem List Patient Active Problem List   Diagnosis Date Noted   Cognitive deficits    Labile blood pressure    Acute blood loss anemia    Slow transit constipation    Vascular headache    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  [place], [place], [address] Phone: [phone]   Fax:  [phone]  Name: Krista Evans MRN: [id number] Date of Birth: [birth year]

## 2020-08-04 ENCOUNTER — Ambulatory Visit: Payer: PPO

## 2020-08-04 ENCOUNTER — Other Ambulatory Visit: Payer: Self-pay

## 2020-08-04 DIAGNOSIS — R2681 Unsteadiness on feet: Secondary | ICD-10-CM

## 2020-08-04 DIAGNOSIS — M6281 Muscle weakness (generalized): Secondary | ICD-10-CM

## 2020-08-04 DIAGNOSIS — R269 Unspecified abnormalities of gait and mobility: Secondary | ICD-10-CM

## 2020-08-04 NOTE — Therapy (Signed)
[place] MAIN [facility] SERVICES [address] [place], [place], [address] Phone: [phone]   Fax:  [phone]  Physical Therapy Treatment  Patient Details  Name: Krista Evans MRN: [id number] Date of Birth: November 30, 1945 Referring Provider (PT): Ramonita Lab   Encounter Date: 08/04/2020   PT End of Session - 08/04/20 1353     Visit Number 62    Number of Visits 79    Date for PT Re-Evaluation 09/22/20    Authorization Type 2/10 PN 07/28/20    Authorization Time Period 02/10/20-04/06/20; 04/06/20-06/29/20    PT Start Time 1346    PT Stop Time 1430    PT Time Calculation (min) 44 min    Equipment Utilized During Treatment Gait belt    Activity Tolerance Patient tolerated treatment well;No increased pain    Behavior During Therapy WFL for tasks assessed/performed             Past Medical History:  Diagnosis Date   Abnormal Q waves on electrocardiogram    Anemia    Aortic atherosclerosis (HCC)    "I could possibly have it, my grandmother had it"   Cancer ([place]) 05/2011   Right upper Lung CA with partial Lobectomy.   Celiac disease    Celiac disease    Dyspnea    with exertion   Hyperlipidemia    Hyperlipidemia    Lung mass    Meningioma (HCC)    Osteoporosis    Personal history of chemotherapy    Personal history of radiation therapy     Past Surgical History:  Procedure Laterality Date   LUNG LOBECTOMY     right lung   VENTRICULOPERITONEAL SHUNT Right 07/03/2017   Procedure: SHUNT INSERTION VENTRICULAR-PERITONEAL;  Surgeon: Newman Pies, MD;  Location: [place];  Service: Neurosurgery;  Laterality: Right;    There were no vitals filed for this visit.   Subjective Assessment - 08/04/20 1352     Subjective Patient reports no falls, had a haircut yesterday. Has been compliant with HEP.    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 Patient has attended OP  PT in the past year for LLE strengthening and balance with positive results, but has not been in attendance recently due to COVID-19 pandemic. PMH includes osteoporosis, anemia, celiac disease, hyperlipidemia, hx of R upper lobe mass with resection 6/13, hx of pneumonia, meningioma, major atherosclerosis. VP shunt has been placed 06/2017.    Limitations Lifting;House hold activities;Standing;Walking    How long can you sit comfortably? n/a    How long can you stand comfortably? ~10 minutes (AM better than PM)    How long can you walk comfortably? a few mins until standing rest break with LLE off ground    Currently in Pain? No/denies                neuro re-ed   Standing on LLE only with BUE support 30 seconds   Ambulate 7 ft with SUE support from chair to/from railing on wall x 3 trials (there and back), max cueing for safety due to patient attempting to reach for armrest prior to safe region resulting in near LOB requiring PT to stabilize patient.     Seated: Seated soccer ball kicks for coordination, spatial awareness and sequencing LLE x 14 kicks   Therex:  Nustep Lvl 3 seat position 9, L arm 11, R arm 10, 4 minutes for cardiovascular challenge.  Ambulate 390 ft with RW with cues for foot clearance and decreased episodes of freezing with close CGA. Patient challenged with turns, people passing her, changing surfaces, and fatigue. cue for maintaining ambulation as passing people    Sit to stand 10x ; one posterior LOB requiring stabilization    Neuro Re-ed:  In // bars:  Ambulate with SUE support 4x length of // bars. Decreased to no UE support for 3 lengths  Step over and back orange hurdle with BUE support 10x each LE; cue for reduction of circumduction   Lateral step over and back orange hurdle BUE support 10x each LE   Seated: Alternate UE/LE raises 10x each side    Patient's vitals monitored throughout the session.  Pt educated throughout session about proper  posture and technique with exercises. Improved exercise technique, movement at target joints, use of target muscles after min to mod verbal, visual, tactile cues    Patient presents with excellent motivation. She is able to perform short duration ambulation in // bars without UE support with encouragement. She is challenged with LLE coordination and strength. Patient continues to require heavy BUE support for sit to stand transfers with excessive posterior LOB with eccentric portion. The patient continues to benefit from additional skilled PT services to improve gait pattern, balance and LE strength for improved quality of life                     PT Education - 08/04/20 1353     Education provided Yes    Education Details exercise technique, body mechanics    Person(s) Educated Patient    Methods Explanation;Demonstration;Tactile cues;Verbal cues    Comprehension Verbalized understanding;Returned demonstration;Verbal cues required;Tactile cues required              PT Short Term Goals - 07/28/20 1033       PT SHORT TERM GOAL #1   Title Pt to demonstrate improved hip extension strength AEB ability to perform 5 full height hooklying bridges.    Baseline unable to rise from chair hands free due to hip extension weakness 6/15: reqiures SUE support for STS 7/13: didnt perform due to back pain    Time 4    Period Weeks    Status Partially Met    Target Date 07/28/20      PT SHORT TERM GOAL #2   Title Pt to demonstrate SLS balance >3 sec hands free to facilitate weight shift in gait and single limb gross motor coordination.    Baseline Requires modA for weightshift, unable to remain in SLS without BUE 4/4: unable to perform 5/11: 1-2 seconds 7/13: unable to perform    Time 4    Period Weeks    Status On-going    Target Date 07/28/20      PT SHORT TERM GOAL #3   Title Pt to demonstrate 5xSTS hands free from single airex pad in <20sec s LOB.    Baseline unable to  rise from chair without BUE 4/4: 31.75 ft 6/15: 20.6 seconds with SUE support 7/13: 18 seconds with heavy BUE support ; 24 seconds    Time 4    Period Weeks    Status Partially Met    Target Date 07/28/20               PT Long Term Goals - 07/28/20 1030       PT LONG TERM GOAL #1   Title Patient will increase FOTO score to  equal to or greater than 65% to demonstrate statistically significant improvement in mobility and quality of life.    Baseline 10/20/19: 32% 11/24: 50.7%; 04/06/20 5/11: 45% 6/15: 45% 7/13: 51%    Time 12    Period Weeks    Status On-going    Target Date 09/22/20      PT LONG TERM GOAL #2   Title Patient will complete five times sit to stand hands free from chair + airex in < 15 seconds for increased coordination and ADL performance    Baseline Unable to rise from airex without use of hands 4/4: 31.75 seconds with no UE support 5/11: 17.23 seconds with BUE support 6/15: 20. 6 seconds with SUE support: 13.9 seconds with BUE support 7/13: 18 seconds with heavy BUE support ; 24 seconds with SUE with two near LOB (decreased)    Time 12    Period Weeks    Status On-going    Target Date 09/22/20      PT LONG TERM GOAL #3   Title Patient will walk with SUE support for 10 ft for increased stability and independence with mobility.    Baseline 6/15: able to walk 2-3 ft with heavy SUE support and max cueing 7/13: SUE (LUE) support 3 ft.    Time 12    Period Weeks    Status On-going    Target Date 09/22/20      PT LONG TERM GOAL #4   Title Pt to tolerate 6MWT c 4WW, supervision level assessment >1033f to facilitate confidence and safety in limited community distance AMB.    Baseline Not performed yet. 4/4: 673 ft with rollator 5/11: 665 ft with rollator 6/15: 702 ft with rollator 7/13: 545 ft with frequent standing rest breaks    Time 12    Period Weeks    Status Partially Met    Target Date 09/22/20      PT LONG TERM GOAL #5   Title Patient will static stand  without UE support for 5 minutes for ADL performance and decreased reliance upon UE's.    Baseline 7/13/ 1 min 36 seconds    Time 12    Period Weeks    Status Partially Met    Target Date 09/22/20                   Plan - 08/04/20 1432     Clinical Impression Statement Patient presents with excellent motivation. She is able to perform short duration ambulation in // bars without UE support with encouragement. She is challenged with LLE coordination and strength. Patient continues to require heavy BUE support for sit to stand transfers with excessive posterior LOB with eccentric portion. The patient continues to benefit from additional skilled PT services to improve gait pattern, balance and LE strength for improved quality of life    Personal Factors and Comorbidities Time since onset of injury/illness/exacerbation;Age;Comorbidity 3+;Past/Current Experience;Transportation    Comorbidities osteoporosis, anemia, hx cancer, hyperlipidemia    Examination-Activity Limitations Bathing;Bed Mobility;Dressing;Reach Overhead;Stairs;Stand;Toileting;Locomotion Level;Squat;Transfers;Bend;Hygiene/Grooming;Carry;Sit    Examination-Participation Restrictions Community Activity;Driving;Shop;Yard Work;Cleaning;Meal Prep    Stability/Clinical Decision Making Evolving/Moderate complexity    Rehab Potential Good    PT Frequency 2x / week    PT Duration 8 weeks    PT Treatment/Interventions ADLs/Self Care Home Management;Aquatic Therapy;Cryotherapy;Electrical Stimulation;DME Instruction;Gait training;Stair training;Functional mobility training;Therapeutic activities;Therapeutic exercise;Balance training;Neuromuscular re-education;Patient/family education;Manual techniques;Passive range of motion;Dry needling;Spinal Manipulations;Joint Manipulations;Orthotic Fit/Training;Energy conservation    PT Next Visit Plan Reducing shuffling gait with turns. LLE  strengthening.    PT Home Exercise Plan not administered  this date    Consulted and Agree with Plan of Care Patient             Patient will benefit from skilled therapeutic intervention in order to improve the following deficits and impairments:  Abnormal gait, Decreased balance, Decreased mobility, Difficulty walking, Hypomobility, Decreased strength, Decreased knowledge of use of DME, Decreased coordination, Postural dysfunction, Impaired flexibility, Improper body mechanics, Impaired perceived functional ability, Decreased activity tolerance  Visit Diagnosis: Unsteadiness on feet  Abnormality of gait and mobility  Muscle weakness (generalized)     Problem List Patient Active Problem List   Diagnosis Date Noted   Cognitive deficits    Labile blood pressure    Acute blood loss anemia    Slow transit constipation    Vascular headache    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 [place], [place], [address] Phone: [phone]   Fax:  [phone]  Name: Krista Evans MRN: [id number] Date of Birth: [birth year]

## 2020-08-09 ENCOUNTER — Ambulatory Visit: Payer: PPO

## 2020-08-09 ENCOUNTER — Other Ambulatory Visit: Payer: Self-pay

## 2020-08-09 DIAGNOSIS — R2681 Unsteadiness on feet: Secondary | ICD-10-CM

## 2020-08-09 DIAGNOSIS — M6281 Muscle weakness (generalized): Secondary | ICD-10-CM

## 2020-08-09 DIAGNOSIS — R278 Other lack of coordination: Secondary | ICD-10-CM

## 2020-08-09 DIAGNOSIS — R2689 Other abnormalities of gait and mobility: Secondary | ICD-10-CM

## 2020-08-09 NOTE — Therapy (Signed)
[place] MAIN [facility] SERVICES [address] [place], [place], [address] Phone: [phone]   Fax:  [phone]  Physical Therapy Treatment  Patient Details  Name: Krista Evans MRN: [id number] Date of Birth: Aug 07, 1945 Referring Provider (PT): Ramonita Lab   Encounter Date: 08/09/2020   PT End of Session - 08/09/20 1445     Visit Number 20    Number of Visits 28    Date for PT Re-Evaluation 09/22/20    Authorization Type 2/10 PN 07/28/20    Authorization Time Period 02/10/20-04/06/20; 04/06/20-06/29/20    PT Start Time 1348    PT Stop Time 1431    PT Time Calculation (min) 43 min    Equipment Utilized During Treatment Gait belt    Activity Tolerance Patient tolerated treatment well;No increased pain    Behavior During Therapy WFL for tasks assessed/performed             Past Medical History:  Diagnosis Date   Abnormal Q waves on electrocardiogram    Anemia    Aortic atherosclerosis (HCC)    "I could possibly have it, my grandmother had it"   Cancer ([place]) 05/2011   Right upper Lung CA with partial Lobectomy.   Celiac disease    Celiac disease    Dyspnea    with exertion   Hyperlipidemia    Hyperlipidemia    Lung mass    Meningioma (HCC)    Osteoporosis    Personal history of chemotherapy    Personal history of radiation therapy     Past Surgical History:  Procedure Laterality Date   LUNG LOBECTOMY     right lung   VENTRICULOPERITONEAL SHUNT Right 07/03/2017   Procedure: SHUNT INSERTION VENTRICULAR-PERITONEAL;  Surgeon: Newman Pies, MD;  Location: [place];  Service: Neurosurgery;  Laterality: Right;    There were no vitals filed for this visit.  Therex:   Pt ambulates approx. 444 ft with RW with cues for foot clearance, proximity to RW, heel strike and step-length with close CGA. Patient continues to be challenged with turns, changing surfaces, fatigue. Reports difficulty with L leg and L foot "stiffness."    Sit to  stand 2x10 use of BUEs to assist; rates "sort of easy"    Neuro Re-ed:   At support bar:  Step over and back orange hurdle with BUE support x multiple reps each LE; very challenging for pt, uses lateral approach, continues to compensate with circumduction as well  Lateral step over and back orange hurdle BUE support 10x each LE; cuing for larger steps to improve foot clearance. Able to intermittently correct  Step ups onto 6" block alternating BLEs as lead foot - more difficulty leading with LLE. BUE support; rates difficult   Seated: Alternate UE/LE raises 2x10 each side; rates medium, has some difficulty with seated balance, must hold on or rocks to back-rest Alternating LE 10x; difficulty with stability; rates medium  Pt educated throughout session about proper posture and technique with exercises. Improved exercise technique, movement at target joints, use of target muscles after min to mod verbal, visual, tactile cues    PT Education - 08/09/20 1437     Education provided Yes    Education Details exercise technique, body mechanics    Person(s) Educated Patient    Methods Explanation;Demonstration;Tactile cues;Verbal cues    Comprehension Verbalized understanding;Returned demonstration;Need further instruction              PT Short Term Goals - 07/28/20  1033       PT SHORT TERM GOAL #1   Title Pt to demonstrate improved hip extension strength AEB ability to perform 5 full height hooklying bridges.    Baseline unable to rise from chair hands free due to hip extension weakness 6/15: reqiures SUE support for STS 7/13: didnt perform due to back pain    Time 4    Period Weeks    Status Partially Met    Target Date 07/28/20      PT SHORT TERM GOAL #2   Title Pt to demonstrate SLS balance >3 sec hands free to facilitate weight shift in gait and single limb gross motor coordination.    Baseline Requires modA for weightshift, unable to remain in SLS without BUE 4/4: unable to  perform 5/11: 1-2 seconds 7/13: unable to perform    Time 4    Period Weeks    Status On-going    Target Date 07/28/20      PT SHORT TERM GOAL #3   Title Pt to demonstrate 5xSTS hands free from single airex pad in <20sec s LOB.    Baseline unable to rise from chair without BUE 4/4: 31.75 ft 6/15: 20.6 seconds with SUE support 7/13: 18 seconds with heavy BUE support ; 24 seconds    Time 4    Period Weeks    Status Partially Met    Target Date 07/28/20               PT Long Term Goals - 07/28/20 1030       PT LONG TERM GOAL #1   Title Patient will increase FOTO score to equal to or greater than 65% to demonstrate statistically significant improvement in mobility and quality of life.    Baseline 10/20/19: 32% 11/24: 50.7%; 04/06/20 5/11: 45% 6/15: 45% 7/13: 51%    Time 12    Period Weeks    Status On-going    Target Date 09/22/20      PT LONG TERM GOAL #2   Title Patient will complete five times sit to stand hands free from chair + airex in < 15 seconds for increased coordination and ADL performance    Baseline Unable to rise from airex without use of hands 4/4: 31.75 seconds with no UE support 5/11: 17.23 seconds with BUE support 6/15: 20. 6 seconds with SUE support: 13.9 seconds with BUE support 7/13: 18 seconds with heavy BUE support ; 24 seconds with SUE with two near LOB (decreased)    Time 12    Period Weeks    Status On-going    Target Date 09/22/20      PT LONG TERM GOAL #3   Title Patient will walk with SUE support for 10 ft for increased stability and independence with mobility.    Baseline 6/15: able to walk 2-3 ft with heavy SUE support and max cueing 7/13: SUE (LUE) support 3 ft.    Time 12    Period Weeks    Status On-going    Target Date 09/22/20      PT LONG TERM GOAL #4   Title Pt to tolerate 6MWT c 4WW, supervision level assessment >1066f to facilitate confidence and safety in limited community distance AMB.    Baseline Not performed yet. 4/4: 673 ft  with rollator 5/11: 665 ft with rollator 6/15: 702 ft with rollator 7/13: 545 ft with frequent standing rest breaks    Time 12    Period Weeks  Status Partially Met    Target Date 09/22/20      PT LONG TERM GOAL #5   Title Patient will static stand without UE support for 5 minutes for ADL performance and decreased reliance upon UE's.    Baseline 7/13/ 1 min 36 seconds    Time 12    Period Weeks    Status Partially Met    Target Date 09/22/20                   Plan - 08/09/20 1446     Clinical Impression Statement Pt continues to have excellent motivation to participate in PT. Author continued POC as laid out in previous sessions. Pt was able to progress with volume of therex performed, indicating improvements in strength and endurance. However, pt does continue to report fatigue and is challenged with gait mechanics/freezing. The pt will benefit from further skilled PT services to improve gait pattern, LE strength and balance to increase QOL.    Personal Factors and Comorbidities Time since onset of injury/illness/exacerbation;Age;Comorbidity 3+;Past/Current Experience;Transportation    Comorbidities osteoporosis, anemia, hx cancer, hyperlipidemia    Examination-Activity Limitations Bathing;Bed Mobility;Dressing;Reach Overhead;Stairs;Stand;Toileting;Locomotion Level;Squat;Transfers;Bend;Hygiene/Grooming;Carry;Sit    Examination-Participation Restrictions Community Activity;Driving;Shop;Yard Work;Cleaning;Meal Prep    Stability/Clinical Decision Making Evolving/Moderate complexity    Rehab Potential Good    PT Frequency 2x / week    PT Duration 8 weeks    PT Treatment/Interventions ADLs/Self Care Home Management;Aquatic Therapy;Cryotherapy;Electrical Stimulation;DME Instruction;Gait training;Stair training;Functional mobility training;Therapeutic activities;Therapeutic exercise;Balance training;Neuromuscular re-education;Patient/family education;Manual techniques;Passive range of  motion;Dry needling;Spinal Manipulations;Joint Manipulations;Orthotic Fit/Training;Energy conservation    PT Next Visit Plan Reducing shuffling gait with turns. LLE strengthening. continue POC as previously indicated    PT Home Exercise Plan not administered this date    Consulted and Agree with Plan of Care Patient             Patient will benefit from skilled therapeutic intervention in order to improve the following deficits and impairments:  Abnormal gait, Decreased balance, Decreased mobility, Difficulty walking, Hypomobility, Decreased strength, Decreased knowledge of use of DME, Decreased coordination, Postural dysfunction, Impaired flexibility, Improper body mechanics, Impaired perceived functional ability, Decreased activity tolerance  Visit Diagnosis: Other abnormalities of gait and mobility  Muscle weakness (generalized)  Other lack of coordination  Unsteadiness on feet     Problem List Patient Active Problem List   Diagnosis Date Noted   Cognitive deficits    Labile blood pressure    Acute blood loss anemia    Slow transit constipation    Vascular headache    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

## 2020-08-11 ENCOUNTER — Other Ambulatory Visit: Payer: Self-pay

## 2020-08-11 ENCOUNTER — Ambulatory Visit: Payer: PPO

## 2020-08-11 DIAGNOSIS — R2689 Other abnormalities of gait and mobility: Secondary | ICD-10-CM

## 2020-08-11 DIAGNOSIS — R2681 Unsteadiness on feet: Secondary | ICD-10-CM | POA: Diagnosis not present

## 2020-08-11 DIAGNOSIS — M6281 Muscle weakness (generalized): Secondary | ICD-10-CM

## 2020-08-11 NOTE — Therapy (Signed)
[place] MAIN [facility] SERVICES [address] [place], [place], [address] Phone: [phone]   Fax:  [phone]  Physical Therapy Treatment  Patient Details  Name: Krista Evans MRN: [id number] Date of Birth: [birth year] Referring Provider (PT): Ramonita Lab   Encounter Date: 08/11/2020   PT End of Session - 08/11/20 0934     Visit Number 29    Number of Visits 81    Date for PT Re-Evaluation 09/22/20    Authorization Type 4/10 PN 07/28/20    Authorization Time Period 02/10/20-04/06/20; 04/06/20-06/29/20    PT Start Time 0930    PT Stop Time 1014    PT Time Calculation (min) 44 min    Equipment Utilized During Treatment Gait belt    Activity Tolerance Patient tolerated treatment well;No increased pain    Behavior During Therapy WFL for tasks assessed/performed             Past Medical History:  Diagnosis Date   Abnormal Q waves on electrocardiogram    Anemia    Aortic atherosclerosis (HCC)    "I could possibly have it, my grandmother had it"   Cancer ([place]) 05/2011   Right upper Lung CA with partial Lobectomy.   Celiac disease    Celiac disease    Dyspnea    with exertion   Hyperlipidemia    Hyperlipidemia    Lung mass    Meningioma (HCC)    Osteoporosis    Personal history of chemotherapy    Personal history of radiation therapy     Past Surgical History:  Procedure Laterality Date   LUNG LOBECTOMY     right lung   VENTRICULOPERITONEAL SHUNT Right 07/03/2017   Procedure: SHUNT INSERTION VENTRICULAR-PERITONEAL;  Surgeon: Newman Pies, MD;  Location: [facility];  Service: Neurosurgery;  Laterality: Right;    There were no vitals filed for this visit.   Subjective Assessment - 08/11/20 0933     Subjective Patient reports no falls or LOB since last session. Has been compliant with HEP.    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 Patient has attended  OP PT in the past year for LLE strengthening and balance with positive results, but has not been in attendance recently due to COVID-19 pandemic. PMH includes osteoporosis, anemia, celiac disease, hyperlipidemia, hx of R upper lobe mass with resection 6/13, hx of pneumonia, meningioma, major atherosclerosis. VP shunt has been placed 06/2017.    Limitations Lifting;House hold activities;Standing;Walking    How long can you sit comfortably? n/a    How long can you stand comfortably? ~10 minutes (AM better than PM)    How long can you walk comfortably? a few mins until standing rest break with LLE off ground    Currently in Pain? No/denies                    Therex:  Nustep Lvl 1-3 seat position 9, L arm 11, R arm 10, 4 minutes for cardiovascular challenge. Very challenging for patient to perform.    Ambulate 390 ft with RW with cues for foot clearance and decreased episodes of freezing with close CGA. Patient challenged with turns, people passing her, changing surfaces, and fatigue. cue for maintaining ambulation as passing people   Sit to stand 10x ; one posterior LOB requiring stabilization  Seated: RTB hamstring curls 15x each LE  RTB around bilateral toes: dorsiflexion with march 12x each  LE  RTB around bilateral ankles: alternating IR/ER 10x each LE   Neuro Re-ed: In // bars:   Ambulate with SUE support 4x length of // bars. Decreased to no UE support for 3 lengths   Step over and back orange hurdle with BUE support 10x each LE; cue for reduction of circumduction   Lateral step over and back orange hurdle BUE support 10x each LE     Patient's vitals monitored throughout the session.  Pt educated throughout session about proper posture and technique with exercises. Improved exercise technique, movement at target joints, use of target muscles after min to mod verbal, visual, tactile cues    Patient has increased shuffle steppage with fatigue and fear of LOB. She continues  to be highly motivated despite fatigue and fear of LOB. She is able to perform prolonged ambulation with decreased standing rest breaks indicating improved capacity for mobility this session. The pt will benefit from further skilled PT services to improve gait pattern, LE strength and balance to increase QOL.                   PT Education - 08/11/20 0933     Education provided Yes    Education Details exercise technique, body mechanics    Person(s) Educated Patient    Methods Explanation;Demonstration;Tactile cues;Verbal cues    Comprehension Verbalized understanding;Returned demonstration;Verbal cues required;Tactile cues required              PT Short Term Goals - 07/28/20 1033       PT SHORT TERM GOAL #1   Title Pt to demonstrate improved hip extension strength AEB ability to perform 5 full height hooklying bridges.    Baseline unable to rise from chair hands free due to hip extension weakness 6/15: reqiures SUE support for STS 7/13: didnt perform due to back pain    Time 4    Period Weeks    Status Partially Met    Target Date 07/28/20      PT SHORT TERM GOAL #2   Title Pt to demonstrate SLS balance >3 sec hands free to facilitate weight shift in gait and single limb gross motor coordination.    Baseline Requires modA for weightshift, unable to remain in SLS without BUE 4/4: unable to perform 5/11: 1-2 seconds 7/13: unable to perform    Time 4    Period Weeks    Status On-going    Target Date 07/28/20      PT SHORT TERM GOAL #3   Title Pt to demonstrate 5xSTS hands free from single airex pad in <20sec s LOB.    Baseline unable to rise from chair without BUE 4/4: 31.75 ft 6/15: 20.6 seconds with SUE support 7/13: 18 seconds with heavy BUE support ; 24 seconds    Time 4    Period Weeks    Status Partially Met    Target Date 07/28/20               PT Long Term Goals - 07/28/20 1030       PT LONG TERM GOAL #1   Title Patient will increase FOTO  score to equal to or greater than 65% to demonstrate statistically significant improvement in mobility and quality of life.    Baseline 10/20/19: 32% 11/24: 50.7%; 04/06/20 5/11: 45% 6/15: 45% 7/13: 51%    Time 12    Period Weeks    Status On-going    Target Date 09/22/20  PT LONG TERM GOAL #2   Title Patient will complete five times sit to stand hands free from chair + airex in < 15 seconds for increased coordination and ADL performance    Baseline Unable to rise from airex without use of hands 4/4: 31.75 seconds with no UE support 5/11: 17.23 seconds with BUE support 6/15: 20. 6 seconds with SUE support: 13.9 seconds with BUE support 7/13: 18 seconds with heavy BUE support ; 24 seconds with SUE with two near LOB (decreased)    Time 12    Period Weeks    Status On-going    Target Date 09/22/20      PT LONG TERM GOAL #3   Title Patient will walk with SUE support for 10 ft for increased stability and independence with mobility.    Baseline 6/15: able to walk 2-3 ft with heavy SUE support and max cueing 7/13: SUE (LUE) support 3 ft.    Time 12    Period Weeks    Status On-going    Target Date 09/22/20      PT LONG TERM GOAL #4   Title Pt to tolerate 6MWT c 4WW, supervision level assessment >1047f to facilitate confidence and safety in limited community distance AMB.    Baseline Not performed yet. 4/4: 673 ft with rollator 5/11: 665 ft with rollator 6/15: 702 ft with rollator 7/13: 545 ft with frequent standing rest breaks    Time 12    Period Weeks    Status Partially Met    Target Date 09/22/20      PT LONG TERM GOAL #5   Title Patient will static stand without UE support for 5 minutes for ADL performance and decreased reliance upon UE's.    Baseline 7/13/ 1 min 36 seconds    Time 12    Period Weeks    Status Partially Met    Target Date 09/22/20                   Plan - 08/11/20 00233    Clinical Impression Statement Patient has increased shuffle steppage with  fatigue and fear of LOB. She continues to be highly motivated despite fatigue and fear of LOB. She is able to perform prolonged ambulation with decreased standing rest breaks indicating improved capacity for mobility this session. The pt will benefit from further skilled PT services to improve gait pattern, LE strength and balance to increase QOL.    Personal Factors and Comorbidities Time since onset of injury/illness/exacerbation;Age;Comorbidity 3+;Past/Current Experience;Transportation    Comorbidities osteoporosis, anemia, hx cancer, hyperlipidemia    Examination-Activity Limitations Bathing;Bed Mobility;Dressing;Reach Overhead;Stairs;Stand;Toileting;Locomotion Level;Squat;Transfers;Bend;Hygiene/Grooming;Carry;Sit    Examination-Participation Restrictions Community Activity;Driving;Shop;Yard Work;Cleaning;Meal Prep    Stability/Clinical Decision Making Evolving/Moderate complexity    Rehab Potential Good    PT Frequency 2x / week    PT Duration 8 weeks    PT Treatment/Interventions ADLs/Self Care Home Management;Aquatic Therapy;Cryotherapy;Electrical Stimulation;DME Instruction;Gait training;Stair training;Functional mobility training;Therapeutic activities;Therapeutic exercise;Balance training;Neuromuscular re-education;Patient/family education;Manual techniques;Passive range of motion;Dry needling;Spinal Manipulations;Joint Manipulations;Orthotic Fit/Training;Energy conservation    PT Next Visit Plan Reducing shuffling gait with turns. LLE strengthening. continue POC as previously indicated    PT Home Exercise Plan not administered this date    Consulted and Agree with Plan of Care Patient             Patient will benefit from skilled therapeutic intervention in order to improve the following deficits and impairments:  Abnormal gait, Decreased balance, Decreased mobility, Difficulty walking, Hypomobility, Decreased  strength, Decreased knowledge of use of DME, Decreased coordination,  Postural dysfunction, Impaired flexibility, Improper body mechanics, Impaired perceived functional ability, Decreased activity tolerance  Visit Diagnosis: Other abnormalities of gait and mobility  Muscle weakness (generalized)  Unsteadiness on feet     Problem List Patient Active Problem List   Diagnosis Date Noted   Cognitive deficits    Labile blood pressure    Acute blood loss anemia    Slow transit constipation    Vascular headache    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   Hydrocephalus ([place]) 07/03/2017   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

## 2020-08-16 ENCOUNTER — Ambulatory Visit: Payer: PPO | Attending: Internal Medicine

## 2020-08-16 ENCOUNTER — Other Ambulatory Visit: Payer: Self-pay

## 2020-08-16 DIAGNOSIS — R2689 Other abnormalities of gait and mobility: Secondary | ICD-10-CM | POA: Insufficient documentation

## 2020-08-16 DIAGNOSIS — R2681 Unsteadiness on feet: Secondary | ICD-10-CM | POA: Insufficient documentation

## 2020-08-16 DIAGNOSIS — M6281 Muscle weakness (generalized): Secondary | ICD-10-CM | POA: Diagnosis not present

## 2020-08-16 DIAGNOSIS — I714 Abdominal aortic aneurysm, without rupture, unspecified: Secondary | ICD-10-CM

## 2020-08-16 HISTORY — DX: Abdominal aortic aneurysm, without rupture, unspecified: I71.40

## 2020-08-16 NOTE — Therapy (Signed)
[place] MAIN [facility] SERVICES [address] [place], [place], [address] Phone: [phone]   Fax:  [phone]  Physical Therapy Treatment  Patient Details  Name: Krista Evans MRN: [id number] Date of Birth: November 15, 1945 Referring Provider (PT): Ramonita Lab   Encounter Date: 08/16/2020   PT End of Session - 08/16/20 0934     Visit Number 65    Number of Visits 81    Date for PT Re-Evaluation 09/22/20    Authorization Type 5/10 PN 07/28/20    Authorization Time Period 02/10/20-04/06/20; 04/06/20-06/29/20    PT Start Time 0930    PT Stop Time 1014    PT Time Calculation (min) 44 min    Equipment Utilized During Treatment Gait belt    Activity Tolerance Patient tolerated treatment well;No increased pain    Behavior During Therapy WFL for tasks assessed/performed             Past Medical History:  Diagnosis Date   Abnormal Q waves on electrocardiogram    Anemia    Aortic atherosclerosis (HCC)    "I could possibly have it, my grandmother had it"   Cancer ([place]) 05/2011   Right upper Lung CA with partial Lobectomy.   Celiac disease    Celiac disease    Dyspnea    with exertion   Hyperlipidemia    Hyperlipidemia    Lung mass    Meningioma (HCC)    Osteoporosis    Personal history of chemotherapy    Personal history of radiation therapy     Past Surgical History:  Procedure Laterality Date   LUNG LOBECTOMY     right lung   VENTRICULOPERITONEAL SHUNT Right 07/03/2017   Procedure: SHUNT INSERTION VENTRICULAR-PERITONEAL;  Surgeon: Newman Pies, MD;  Location: [facility];  Service: Neurosurgery;  Laterality: Right;    There were no vitals filed for this visit.   Subjective Assessment - 08/16/20 0933     Subjective Patient reports no falls or LOB since last session. Did not do much over the weekend besides her exercises.    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 Patient has attended OP PT in the past year for LLE strengthening and balance with positive results, but has not been in attendance recently due to COVID-19 pandemic. PMH includes osteoporosis, anemia, celiac disease, hyperlipidemia, hx of R upper lobe mass with resection 6/13, hx of pneumonia, meningioma, major atherosclerosis. VP shunt has been placed 06/2017.    Limitations Lifting;House hold activities;Standing;Walking    How long can you sit comfortably? n/a    How long can you stand comfortably? ~10 minutes (AM better than PM)    How long can you walk comfortably? a few mins until standing rest break with LLE off ground    Currently in Pain? No/denies                Therex:  Nustep Lvl 1-3 seat position 9, L arm 11, R arm 10, 4 minutes for cardiovascular challenge. Very challenging for patient to perform.    Sit to stand 10x ; one posterior LOB requiring stabilization   Standing with # 3lb ankle weight: CGA for stability  -Hip extension with bilateral upper extremity support, cueing for neutral hip alignment, upright posture for optimal muscle recruitment, and sequencing, 15x each LE,  -Hip abduction with bilateral upper extremity support, cueing for neutral foot alignment for correct muscle activation, 15x each LE -  Hip flexion with bilateral upper extremity support, cueing for body mechanics, speed of muscle recruitment for optimal strengthening and stabilization 15x each LE -Hamstring curl with bilateral upper extremity support, cueing for knee alignment for recruitment of hamstring musculature, 15x each LE   Seated with # 3lb ankle weights  -Seated marches with upright posture, back away from back of chair for abdominal/trunk activation/stabilization, 10x each LE -Seated LAQ with 3 second holds, 10x each LE, cueing for muscle activation and sequencing for neutral alignment -Seated IR/ER with cueing for stabilizing knee placement with lateral foot movement for  optimal muscle recruitment, 10x each LE -heel raises 15x    Neuro Re-ed: Next to support bar    Ambulate with SUE support to bar and back to chair (~ 4 ft each direction) LUE support with PT hand held position. Cue for not reaching early to chair. X 4 trials   Lateral step over and back orange hurdle BUE support 10x each LE      Patient's vitals monitored throughout the session.  Pt educated throughout session about proper posture and technique with exercises. Improved exercise technique, movement at target joints, use of target muscles after min to mod verbal, visual, tactile cues    Patient is improving with her ability to ambulate with SUE support with decreased shuffle steppage and improved safety when returning to chair. Eccentric control of sit to stand is an area of continued focus as she frequently "plops" when fatigued. She tolerates strengthening interventions well. The pt will benefit from further skilled PT services to improve gait pattern, LE strength and balance to increase QOL.                       PT Education - 08/16/20 0934     Education provided Yes    Education Details exercise technique, body mechanics    Person(s) Educated Patient    Methods Explanation;Demonstration;Tactile cues;Verbal cues    Comprehension Verbalized understanding;Returned demonstration;Verbal cues required;Tactile cues required              PT Short Term Goals - 07/28/20 1033       PT SHORT TERM GOAL #1   Title Pt to demonstrate improved hip extension strength AEB ability to perform 5 full height hooklying bridges.    Baseline unable to rise from chair hands free due to hip extension weakness 6/15: reqiures SUE support for STS 7/13: didnt perform due to back pain    Time 4    Period Weeks    Status Partially Met    Target Date 07/28/20      PT SHORT TERM GOAL #2   Title Pt to demonstrate SLS balance >3 sec hands free to facilitate weight shift in gait and single  limb gross motor coordination.    Baseline Requires modA for weightshift, unable to remain in SLS without BUE 4/4: unable to perform 5/11: 1-2 seconds 7/13: unable to perform    Time 4    Period Weeks    Status On-going    Target Date 07/28/20      PT SHORT TERM GOAL #3   Title Pt to demonstrate 5xSTS hands free from single airex pad in <20sec s LOB.    Baseline unable to rise from chair without BUE 4/4: 31.75 ft 6/15: 20.6 seconds with SUE support 7/13: 18 seconds with heavy BUE support ; 24 seconds    Time 4    Period Weeks    Status Partially Met  Target Date 07/28/20               PT Long Term Goals - 07/28/20 1030       PT LONG TERM GOAL #1   Title Patient will increase FOTO score to equal to or greater than 65% to demonstrate statistically significant improvement in mobility and quality of life.    Baseline 10/20/19: 32% 11/24: 50.7%; 04/06/20 5/11: 45% 6/15: 45% 7/13: 51%    Time 12    Period Weeks    Status On-going    Target Date 09/22/20      PT LONG TERM GOAL #2   Title Patient will complete five times sit to stand hands free from chair + airex in < 15 seconds for increased coordination and ADL performance    Baseline Unable to rise from airex without use of hands 4/4: 31.75 seconds with no UE support 5/11: 17.23 seconds with BUE support 6/15: 20. 6 seconds with SUE support: 13.9 seconds with BUE support 7/13: 18 seconds with heavy BUE support ; 24 seconds with SUE with two near LOB (decreased)    Time 12    Period Weeks    Status On-going    Target Date 09/22/20      PT LONG TERM GOAL #3   Title Patient will walk with SUE support for 10 ft for increased stability and independence with mobility.    Baseline 6/15: able to walk 2-3 ft with heavy SUE support and max cueing 7/13: SUE (LUE) support 3 ft.    Time 12    Period Weeks    Status On-going    Target Date 09/22/20      PT LONG TERM GOAL #4   Title Pt to tolerate 6MWT c 4WW, supervision level assessment  >1018f to facilitate confidence and safety in limited community distance AMB.    Baseline Not performed yet. 4/4: 673 ft with rollator 5/11: 665 ft with rollator 6/15: 702 ft with rollator 7/13: 545 ft with frequent standing rest breaks    Time 12    Period Weeks    Status Partially Met    Target Date 09/22/20      PT LONG TERM GOAL #5   Title Patient will static stand without UE support for 5 minutes for ADL performance and decreased reliance upon UE's.    Baseline 7/13/ 1 min 36 seconds    Time 12    Period Weeks    Status Partially Met    Target Date 09/22/20                   Plan - 08/16/20 0957     Clinical Impression Statement Patient is improving with her ability to ambulate with SUE support with decreased shuffle steppage and improved safety when returning to chair. Eccentric control of sit to stand is an area of continued focus as she frequently "plops" when fatigued. She tolerates strengthening interventions well. The pt will benefit from further skilled PT services to improve gait pattern, LE strength and balance to increase QOL.    Personal Factors and Comorbidities Time since onset of injury/illness/exacerbation;Age;Comorbidity 3+;Past/Current Experience;Transportation    Comorbidities osteoporosis, anemia, hx cancer, hyperlipidemia    Examination-Activity Limitations Bathing;Bed Mobility;Dressing;Reach Overhead;Stairs;Stand;Toileting;Locomotion Level;Squat;Transfers;Bend;Hygiene/Grooming;Carry;Sit    Examination-Participation Restrictions Community Activity;Driving;Shop;Yard Work;Cleaning;Meal Prep    Stability/Clinical Decision Making Evolving/Moderate complexity    Rehab Potential Good    PT Frequency 2x / week    PT Duration 8 weeks    PT Treatment/Interventions  ADLs/Self Care Home Management;Aquatic Therapy;Cryotherapy;Electrical Stimulation;DME Instruction;Gait training;Stair training;Functional mobility training;Therapeutic activities;Therapeutic  exercise;Balance training;Neuromuscular re-education;Patient/family education;Manual techniques;Passive range of motion;Dry needling;Spinal Manipulations;Joint Manipulations;Orthotic Fit/Training;Energy conservation    PT Next Visit Plan Reducing shuffling gait with turns. LLE strengthening. continue POC as previously indicated    PT Home Exercise Plan not administered this date    Consulted and Agree with Plan of Care Patient             Patient will benefit from skilled therapeutic intervention in order to improve the following deficits and impairments:  Abnormal gait, Decreased balance, Decreased mobility, Difficulty walking, Hypomobility, Decreased strength, Decreased knowledge of use of DME, Decreased coordination, Postural dysfunction, Impaired flexibility, Improper body mechanics, Impaired perceived functional ability, Decreased activity tolerance  Visit Diagnosis: Other abnormalities of gait and mobility  Muscle weakness (generalized)  Unsteadiness on feet     Problem List Patient Active Problem List   Diagnosis Date Noted   Cognitive deficits    Labile blood pressure    Acute blood loss anemia    Slow transit constipation    Vascular headache    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 [place], [place], [address] Phone: [phone]   Fax:  [phone]  Name: Krista Evans MRN: [id number] Date of Birth: 1945-11-04

## 2020-08-18 ENCOUNTER — Ambulatory Visit: Payer: PPO

## 2020-08-18 DIAGNOSIS — D329 Benign neoplasm of meninges, unspecified: Secondary | ICD-10-CM | POA: Diagnosis not present

## 2020-08-18 DIAGNOSIS — M810 Age-related osteoporosis without current pathological fracture: Secondary | ICD-10-CM | POA: Diagnosis not present

## 2020-08-18 DIAGNOSIS — Z0000 Encounter for general adult medical examination without abnormal findings: Secondary | ICD-10-CM | POA: Diagnosis not present

## 2020-08-18 DIAGNOSIS — D649 Anemia, unspecified: Secondary | ICD-10-CM | POA: Diagnosis not present

## 2020-08-18 DIAGNOSIS — R779 Abnormality of plasma protein, unspecified: Secondary | ICD-10-CM | POA: Diagnosis not present

## 2020-08-18 DIAGNOSIS — Z85118 Personal history of other malignant neoplasm of bronchus and lung: Secondary | ICD-10-CM | POA: Diagnosis not present

## 2020-08-18 DIAGNOSIS — E441 Mild protein-calorie malnutrition: Secondary | ICD-10-CM | POA: Diagnosis not present

## 2020-08-18 DIAGNOSIS — E7849 Other hyperlipidemia: Secondary | ICD-10-CM | POA: Diagnosis not present

## 2020-08-18 DIAGNOSIS — I700 Atherosclerosis of aorta: Secondary | ICD-10-CM | POA: Diagnosis not present

## 2020-08-18 DIAGNOSIS — R634 Abnormal weight loss: Secondary | ICD-10-CM | POA: Diagnosis not present

## 2020-08-18 DIAGNOSIS — K900 Celiac disease: Secondary | ICD-10-CM | POA: Diagnosis not present

## 2020-08-18 DIAGNOSIS — G912 (Idiopathic) normal pressure hydrocephalus: Secondary | ICD-10-CM | POA: Diagnosis not present

## 2020-08-19 ENCOUNTER — Other Ambulatory Visit: Payer: Self-pay | Admitting: Internal Medicine

## 2020-08-19 DIAGNOSIS — R634 Abnormal weight loss: Secondary | ICD-10-CM

## 2020-08-19 DIAGNOSIS — Z85118 Personal history of other malignant neoplasm of bronchus and lung: Secondary | ICD-10-CM

## 2020-08-19 DIAGNOSIS — E441 Mild protein-calorie malnutrition: Secondary | ICD-10-CM

## 2020-08-23 ENCOUNTER — Other Ambulatory Visit: Payer: Self-pay

## 2020-08-23 ENCOUNTER — Ambulatory Visit: Payer: PPO

## 2020-08-23 DIAGNOSIS — R2689 Other abnormalities of gait and mobility: Secondary | ICD-10-CM | POA: Diagnosis not present

## 2020-08-23 DIAGNOSIS — M6281 Muscle weakness (generalized): Secondary | ICD-10-CM

## 2020-08-23 DIAGNOSIS — R2681 Unsteadiness on feet: Secondary | ICD-10-CM

## 2020-08-23 NOTE — Therapy (Signed)
[place] MAIN [facility] SERVICES [address] [place], [place], [address] Phone: [phone]   Fax:  [phone]  Physical Therapy Treatment  Patient Details  Name: Krista Evans MRN: [id number] Date of Birth: 12-Jan-1946 Referring Provider (PT): Ramonita Lab   Encounter Date: 08/23/2020   PT End of Session - 08/23/20 0935     Visit Number 69    Number of Visits 54    Date for PT Re-Evaluation 09/22/20    Authorization Type 6/10 PN 07/28/20    Authorization Time Period 02/10/20-04/06/20; 04/06/20-06/29/20    PT Start Time 0930    PT Stop Time 1015    PT Time Calculation (min) 45 min    Equipment Utilized During Treatment Gait belt    Activity Tolerance Patient tolerated treatment well;No increased pain    Behavior During Therapy WFL for tasks assessed/performed             Past Medical History:  Diagnosis Date   Abnormal Q waves on electrocardiogram    Anemia    Aortic atherosclerosis (HCC)    "I could possibly have it, my grandmother had it"   Cancer ([place]) 05/2011   Right upper Lung CA with partial Lobectomy.   Celiac disease    Celiac disease    Dyspnea    with exertion   Hyperlipidemia    Hyperlipidemia    Lung mass    Meningioma (HCC)    Osteoporosis    Personal history of chemotherapy    Personal history of radiation therapy     Past Surgical History:  Procedure Laterality Date   LUNG LOBECTOMY     right lung   VENTRICULOPERITONEAL SHUNT Right 07/03/2017   Procedure: SHUNT INSERTION VENTRICULAR-PERITONEAL;  Surgeon: Newman Pies, MD;  Location: [place];  Service: Neurosurgery;  Laterality: Right;    There were no vitals filed for this visit.   Subjective Assessment - 08/23/20 0934     Subjective Patient reports her HEP but didn't get much walking in. No falls or LOB since last session.    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 Patient has  attended OP PT in the past year for LLE strengthening and balance with positive results, but has not been in attendance recently due to COVID-19 pandemic. PMH includes osteoporosis, anemia, celiac disease, hyperlipidemia, hx of R upper lobe mass with resection 6/13, hx of pneumonia, meningioma, major atherosclerosis. VP shunt has been placed 06/2017.    Limitations Lifting;House hold activities;Standing;Walking    How long can you sit comfortably? n/a    How long can you stand comfortably? ~10 minutes (AM better than PM)    How long can you walk comfortably? a few mins until standing rest break with LLE off ground    Currently in Pain? No/denies                   Therex:  Nustep Lvl 1-3 seat position 9, L arm 11, R arm 10, 4 minutes for cardiovascular challenge. Very challenging for patient to perform.    Sit to stand 10x ; one posterior LOB requiring stabilization   6" step toe taps 12x each LE; BUE support  6" step step up/down BUE support 8x each LE  Neuro Re-ed: Next to support bar    Ambulate with SUE support to bar and back to chair (~ 4 ft each direction) LUE support with PT hand held position. Cue  for not reaching early to chair. X1 trials Ambulate in // bars with SUE support close CGA ; decreased to no UE support 4x length of // bars Lateral step over and back orange hurdle BUE support 10x each LE Forward step over/back orange hurdle BUE support 10x each LE       Patient's vitals monitored throughout the session.  Pt educated throughout session about proper posture and technique with exercises. Improved exercise technique, movement at target joints, use of target muscles after min to mod verbal, visual, tactile cues    Patient is highly motivated throughout session. She demonstrate extensor tone with frequent c/o stiffness with ambulation and/or negotiation of obstacles. Rest breaks are required throughout session due to fatigue. She is able to clear obstacles with her  LLE with decreased tripping this session; she continues to require external cues for sequencing of tasks. The pt will benefit from further skilled PT services to improve gait pattern, LE strength and balance to increase QOL.                  PT Education - 08/23/20 0935     Education provided Yes    Education Details exercise technique, body mechanics    Person(s) Educated Patient    Methods Explanation;Demonstration;Tactile cues;Verbal cues    Comprehension Verbalized understanding;Returned demonstration;Verbal cues required;Tactile cues required              PT Short Term Goals - 07/28/20 1033       PT SHORT TERM GOAL #1   Title Pt to demonstrate improved hip extension strength AEB ability to perform 5 full height hooklying bridges.    Baseline unable to rise from chair hands free due to hip extension weakness 6/15: reqiures SUE support for STS 7/13: didnt perform due to back pain    Time 4    Period Weeks    Status Partially Met    Target Date 07/28/20      PT SHORT TERM GOAL #2   Title Pt to demonstrate SLS balance >3 sec hands free to facilitate weight shift in gait and single limb gross motor coordination.    Baseline Requires modA for weightshift, unable to remain in SLS without BUE 4/4: unable to perform 5/11: 1-2 seconds 7/13: unable to perform    Time 4    Period Weeks    Status On-going    Target Date 07/28/20      PT SHORT TERM GOAL #3   Title Pt to demonstrate 5xSTS hands free from single airex pad in <20sec s LOB.    Baseline unable to rise from chair without BUE 4/4: 31.75 ft 6/15: 20.6 seconds with SUE support 7/13: 18 seconds with heavy BUE support ; 24 seconds    Time 4    Period Weeks    Status Partially Met    Target Date 07/28/20               PT Long Term Goals - 07/28/20 1030       PT LONG TERM GOAL #1   Title Patient will increase FOTO score to equal to or greater than 65% to demonstrate statistically significant improvement  in mobility and quality of life.    Baseline 10/20/19: 32% 11/24: 50.7%; 04/06/20 5/11: 45% 6/15: 45% 7/13: 51%    Time 12    Period Weeks    Status On-going    Target Date 09/22/20      PT LONG TERM GOAL #2  Title Patient will complete five times sit to stand hands free from chair + airex in < 15 seconds for increased coordination and ADL performance    Baseline Unable to rise from airex without use of hands 4/4: 31.75 seconds with no UE support 5/11: 17.23 seconds with BUE support 6/15: 20. 6 seconds with SUE support: 13.9 seconds with BUE support 7/13: 18 seconds with heavy BUE support ; 24 seconds with SUE with two near LOB (decreased)    Time 12    Period Weeks    Status On-going    Target Date 09/22/20      PT LONG TERM GOAL #3   Title Patient will walk with SUE support for 10 ft for increased stability and independence with mobility.    Baseline 6/15: able to walk 2-3 ft with heavy SUE support and max cueing 7/13: SUE (LUE) support 3 ft.    Time 12    Period Weeks    Status On-going    Target Date 09/22/20      PT LONG TERM GOAL #4   Title Pt to tolerate 6MWT c 4WW, supervision level assessment >109f to facilitate confidence and safety in limited community distance AMB.    Baseline Not performed yet. 4/4: 673 ft with rollator 5/11: 665 ft with rollator 6/15: 702 ft with rollator 7/13: 545 ft with frequent standing rest breaks    Time 12    Period Weeks    Status Partially Met    Target Date 09/22/20      PT LONG TERM GOAL #5   Title Patient will static stand without UE support for 5 minutes for ADL performance and decreased reliance upon UE's.    Baseline 7/13/ 1 min 36 seconds    Time 12    Period Weeks    Status Partially Met    Target Date 09/22/20                   Plan - 08/23/20 1000     Clinical Impression Statement Patient is highly motivated throughout session. She demonstrate extensor tone with frequent c/o stiffness with ambulation and/or  negotiation of obstacles. Rest breaks are required throughout session due to fatigue. She is able to clear obstacles with her LLE with decreased tripping this session; she continues to require external cues for sequencing of tasks. The pt will benefit from further skilled PT services to improve gait pattern, LE strength and balance to increase QOL.    Personal Factors and Comorbidities Time since onset of injury/illness/exacerbation;Age;Comorbidity 3+;Past/Current Experience;Transportation    Comorbidities osteoporosis, anemia, hx cancer, hyperlipidemia    Examination-Activity Limitations Bathing;Bed Mobility;Dressing;Reach Overhead;Stairs;Stand;Toileting;Locomotion Level;Squat;Transfers;Bend;Hygiene/Grooming;Carry;Sit    Examination-Participation Restrictions Community Activity;Driving;Shop;Yard Work;Cleaning;Meal Prep    Stability/Clinical Decision Making Evolving/Moderate complexity    Rehab Potential Good    PT Frequency 2x / week    PT Duration 8 weeks    PT Treatment/Interventions ADLs/Self Care Home Management;Aquatic Therapy;Cryotherapy;Electrical Stimulation;DME Instruction;Gait training;Stair training;Functional mobility training;Therapeutic activities;Therapeutic exercise;Balance training;Neuromuscular re-education;Patient/family education;Manual techniques;Passive range of motion;Dry needling;Spinal Manipulations;Joint Manipulations;Orthotic Fit/Training;Energy conservation    PT Next Visit Plan Reducing shuffling gait with turns. LLE strengthening. continue POC as previously indicated    PT Home Exercise Plan not administered this date    Consulted and Agree with Plan of Care Patient             Patient will benefit from skilled therapeutic intervention in order to improve the following deficits and impairments:  Abnormal gait, Decreased balance, Decreased mobility,  Difficulty walking, Hypomobility, Decreased strength, Decreased knowledge of use of DME, Decreased coordination,  Postural dysfunction, Impaired flexibility, Improper body mechanics, Impaired perceived functional ability, Decreased activity tolerance  Visit Diagnosis: Other abnormalities of gait and mobility  Muscle weakness (generalized)  Unsteadiness on feet     Problem List Patient Active Problem List   Diagnosis Date Noted   Cognitive deficits    Labile blood pressure    Acute blood loss anemia    Slow transit constipation    Vascular headache    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 [place], [place], [address] Phone: [phone]   Fax:  [phone]  Name: Krista Evans MRN: [id number] Date of Birth: [birth year]

## 2020-08-25 ENCOUNTER — Ambulatory Visit: Payer: PPO

## 2020-08-25 ENCOUNTER — Other Ambulatory Visit: Payer: Self-pay

## 2020-08-25 DIAGNOSIS — R2681 Unsteadiness on feet: Secondary | ICD-10-CM

## 2020-08-25 DIAGNOSIS — R2689 Other abnormalities of gait and mobility: Secondary | ICD-10-CM

## 2020-08-25 DIAGNOSIS — M6281 Muscle weakness (generalized): Secondary | ICD-10-CM

## 2020-08-25 NOTE — Therapy (Signed)
[place] MAIN [facility] SERVICES [address] [place], [place], [address] Phone: [phone]   Fax:  [phone]  Physical Therapy Treatment  Patient Details  Name: Krista Evans MRN: [id number] Date of Birth: 18-Oct-1945 Referring Provider (PT): Ramonita Lab   Encounter Date: 08/25/2020   PT End of Session - 08/25/20 0934     Visit Number 11    Number of Visits 81    Date for PT Re-Evaluation 09/22/20    Authorization Type 7/10 PN 07/28/20    Authorization Time Period 02/10/20-04/06/20; 04/06/20-06/29/20    PT Start Time 0930    PT Stop Time 1014    PT Time Calculation (min) 44 min    Equipment Utilized During Treatment Gait belt    Activity Tolerance Patient tolerated treatment well;No increased pain    Behavior During Therapy WFL for tasks assessed/performed             Past Medical History:  Diagnosis Date   Abnormal Q waves on electrocardiogram    Anemia    Aortic atherosclerosis (HCC)    "I could possibly have it, my grandmother had it"   Cancer ([place]) 05/2011   Right upper Lung CA with partial Lobectomy.   Celiac disease    Celiac disease    Dyspnea    with exertion   Hyperlipidemia    Hyperlipidemia    Lung mass    Meningioma (HCC)    Osteoporosis    Personal history of chemotherapy    Personal history of radiation therapy     Past Surgical History:  Procedure Laterality Date   LUNG LOBECTOMY     right lung   VENTRICULOPERITONEAL SHUNT Right 07/03/2017   Procedure: SHUNT INSERTION VENTRICULAR-PERITONEAL;  Surgeon: Newman Pies, MD;  Location: [place];  Service: Neurosurgery;  Laterality: Right;    There were no vitals filed for this visit.   Subjective Assessment - 08/25/20 0933     Subjective Patient reports staying inside yesterday but did her HEP. No falls or LOB since last session.    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 Patient  has attended OP PT in the past year for LLE strengthening and balance with positive results, but has not been in attendance recently due to COVID-19 pandemic. PMH includes osteoporosis, anemia, celiac disease, hyperlipidemia, hx of R upper lobe mass with resection 6/13, hx of pneumonia, meningioma, major atherosclerosis. VP shunt has been placed 06/2017.    Limitations Lifting;House hold activities;Standing;Walking    How long can you sit comfortably? n/a    How long can you stand comfortably? ~10 minutes (AM better than PM)    How long can you walk comfortably? a few mins until standing rest break with LLE off ground    Currently in Pain? No/denies                    Therex:   Nustep Lvl 1-3 seat position 9, L arm 11, R arm 10, 4 minutes for cardiovascular challenge. Very challenging for patient to perform.    Ambulate 390 ft with RW with cues for foot clearance and decreased episodes of freezing with close CGA. Patient challenged with turns, people passing her, changing surfaces, and fatigue.  Sit to stand 10x ; one posterior LOB requiring stabilization Lunges with BUE on bar: modified due to limited LLE flexion   6" step toe taps 12x each LE; BUE support 6"  step step up/down BUE support 10x each LE  Seated:  RTB around bilateral ankles: alternating LAQ 12x each LE  RTB around bilateral ankles: alternating IR/ER 12x each LE   RTB hamstring curl 10x each LE  Neuro Re-ed: Next to support bar  Ambulate with SUE support to bar and back to chair (~ 4 ft each direction) LUE support with PT hand held position. Cue for not reaching early to chair. X1 trials Airex pad: static stand and let go with Ue's 30 seconds x 2 trials, very challenging for patient to let go with Ue's       Patient's vitals monitored throughout the session.  Pt educated throughout session about proper posture and technique with exercises. Improved exercise technique, movement at target joints, use of target  muscles after min to mod verbal, visual, tactile cues    Patient is highly motivated throughout physical therapy session. She tolerates progressive mobility and stability interventions well despite high fear of falling. LLE extension tone is present throughout session requiring extensive cueing for flexion. She is able to perform lunges with cue for increased hip extension for wider step. The pt will benefit from further skilled PT services to improve gait pattern, LE strength and balance to increase QOL.                  PT Education - 08/25/20 0934     Education provided Yes    Education Details exercise technique, body mechanics    Person(s) Educated Patient    Methods Explanation;Demonstration;Tactile cues;Verbal cues    Comprehension Verbalized understanding;Returned demonstration;Verbal cues required;Tactile cues required              PT Short Term Goals - 07/28/20 1033       PT SHORT TERM GOAL #1   Title Pt to demonstrate improved hip extension strength AEB ability to perform 5 full height hooklying bridges.    Baseline unable to rise from chair hands free due to hip extension weakness 6/15: reqiures SUE support for STS 7/13: didnt perform due to back pain    Time 4    Period Weeks    Status Partially Met    Target Date 07/28/20      PT SHORT TERM GOAL #2   Title Pt to demonstrate SLS balance >3 sec hands free to facilitate weight shift in gait and single limb gross motor coordination.    Baseline Requires modA for weightshift, unable to remain in SLS without BUE 4/4: unable to perform 5/11: 1-2 seconds 7/13: unable to perform    Time 4    Period Weeks    Status On-going    Target Date 07/28/20      PT SHORT TERM GOAL #3   Title Pt to demonstrate 5xSTS hands free from single airex pad in <20sec s LOB.    Baseline unable to rise from chair without BUE 4/4: 31.75 ft 6/15: 20.6 seconds with SUE support 7/13: 18 seconds with heavy BUE support ; 24 seconds     Time 4    Period Weeks    Status Partially Met    Target Date 07/28/20               PT Long Term Goals - 07/28/20 1030       PT LONG TERM GOAL #1   Title Patient will increase FOTO score to equal to or greater than 65% to demonstrate statistically significant improvement in mobility and quality of life.  Baseline 10/20/19: 32% 11/24: 50.7%; 04/06/20 5/11: 45% 6/15: 45% 7/13: 51%    Time 12    Period Weeks    Status On-going    Target Date 09/22/20      PT LONG TERM GOAL #2   Title Patient will complete five times sit to stand hands free from chair + airex in < 15 seconds for increased coordination and ADL performance    Baseline Unable to rise from airex without use of hands 4/4: 31.75 seconds with no UE support 5/11: 17.23 seconds with BUE support 6/15: 20. 6 seconds with SUE support: 13.9 seconds with BUE support 7/13: 18 seconds with heavy BUE support ; 24 seconds with SUE with two near LOB (decreased)    Time 12    Period Weeks    Status On-going    Target Date 09/22/20      PT LONG TERM GOAL #3   Title Patient will walk with SUE support for 10 ft for increased stability and independence with mobility.    Baseline 6/15: able to walk 2-3 ft with heavy SUE support and max cueing 7/13: SUE (LUE) support 3 ft.    Time 12    Period Weeks    Status On-going    Target Date 09/22/20      PT LONG TERM GOAL #4   Title Pt to tolerate 6MWT c 4WW, supervision level assessment >1037f to facilitate confidence and safety in limited community distance AMB.    Baseline Not performed yet. 4/4: 673 ft with rollator 5/11: 665 ft with rollator 6/15: 702 ft with rollator 7/13: 545 ft with frequent standing rest breaks    Time 12    Period Weeks    Status Partially Met    Target Date 09/22/20      PT LONG TERM GOAL #5   Title Patient will static stand without UE support for 5 minutes for ADL performance and decreased reliance upon UE's.    Baseline 7/13/ 1 min 36 seconds    Time 12     Period Weeks    Status Partially Met    Target Date 09/22/20                   Plan - 08/25/20 1005     Clinical Impression Statement Patient is highly motivated throughout physical therapy session. She tolerates progressive mobility and stability interventions well despite high fear of falling. LLE extension tone is present throughout session requiring extensive cueing for flexion. She is able to perform lunges with cue for increased hip extension for wider step. The pt will benefit from further skilled PT services to improve gait pattern, LE strength and balance to increase QOL.    Personal Factors and Comorbidities Time since onset of injury/illness/exacerbation;Age;Comorbidity 3+;Past/Current Experience;Transportation    Comorbidities osteoporosis, anemia, hx cancer, hyperlipidemia    Examination-Activity Limitations Bathing;Bed Mobility;Dressing;Reach Overhead;Stairs;Stand;Toileting;Locomotion Level;Squat;Transfers;Bend;Hygiene/Grooming;Carry;Sit    Examination-Participation Restrictions Community Activity;Driving;Shop;Yard Work;Cleaning;Meal Prep    Stability/Clinical Decision Making Evolving/Moderate complexity    Rehab Potential Good    PT Frequency 2x / week    PT Duration 8 weeks    PT Treatment/Interventions ADLs/Self Care Home Management;Aquatic Therapy;Cryotherapy;Electrical Stimulation;DME Instruction;Gait training;Stair training;Functional mobility training;Therapeutic activities;Therapeutic exercise;Balance training;Neuromuscular re-education;Patient/family education;Manual techniques;Passive range of motion;Dry needling;Spinal Manipulations;Joint Manipulations;Orthotic Fit/Training;Energy conservation    PT Next Visit Plan Reducing shuffling gait with turns. LLE strengthening. continue POC as previously indicated    PT Home Exercise Plan not administered this date    Consulted and Agree  with Plan of Care Patient             Patient will benefit from skilled  therapeutic intervention in order to improve the following deficits and impairments:  Abnormal gait, Decreased balance, Decreased mobility, Difficulty walking, Hypomobility, Decreased strength, Decreased knowledge of use of DME, Decreased coordination, Postural dysfunction, Impaired flexibility, Improper body mechanics, Impaired perceived functional ability, Decreased activity tolerance  Visit Diagnosis: Other abnormalities of gait and mobility  Muscle weakness (generalized)  Unsteadiness on feet     Problem List Patient Active Problem List   Diagnosis Date Noted   Cognitive deficits    Labile blood pressure    Acute blood loss anemia    Slow transit constipation    Vascular headache    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

## 2020-08-30 ENCOUNTER — Other Ambulatory Visit: Payer: Self-pay

## 2020-08-30 ENCOUNTER — Ambulatory Visit: Payer: PPO

## 2020-08-30 DIAGNOSIS — R2681 Unsteadiness on feet: Secondary | ICD-10-CM

## 2020-08-30 DIAGNOSIS — M6281 Muscle weakness (generalized): Secondary | ICD-10-CM

## 2020-08-30 DIAGNOSIS — R2689 Other abnormalities of gait and mobility: Secondary | ICD-10-CM

## 2020-08-30 NOTE — Therapy (Signed)
[place] MAIN [facility] SERVICES [address] [place], [place], [address] Phone: [phone]   Fax:  [phone]  Physical Therapy Treatment  Patient Details  Name: Krista Evans MRN: [id number] Date of Birth: 02-Oct-1945 Referring Provider (PT): Ramonita Lab   Encounter Date: 08/30/2020   PT End of Session - 08/30/20 0935     Visit Number 110    Number of Visits 81    Date for PT Re-Evaluation 09/22/20    Authorization Type 8/10 PN 07/28/20    Authorization Time Period 02/10/20-04/06/20; 04/06/20-06/29/20    PT Start Time 0930    PT Stop Time 1014    PT Time Calculation (min) 44 min    Equipment Utilized During Treatment Gait belt    Activity Tolerance Patient tolerated treatment well;No increased pain    Behavior During Therapy WFL for tasks assessed/performed             Past Medical History:  Diagnosis Date   Abnormal Q waves on electrocardiogram    Anemia    Aortic atherosclerosis (HCC)    "I could possibly have it, my grandmother had it"   Cancer ([place]) 05/2011   Right upper Lung CA with partial Lobectomy.   Celiac disease    Celiac disease    Dyspnea    with exertion   Hyperlipidemia    Hyperlipidemia    Lung mass    Meningioma (HCC)    Osteoporosis    Personal history of chemotherapy    Personal history of radiation therapy     Past Surgical History:  Procedure Laterality Date   LUNG LOBECTOMY     right lung   VENTRICULOPERITONEAL SHUNT Right 07/03/2017   Procedure: SHUNT INSERTION VENTRICULAR-PERITONEAL;  Surgeon: Newman Pies, MD;  Location: [facility];  Service: Neurosurgery;  Laterality: Right;    There were no vitals filed for this visit.   Subjective Assessment - 08/30/20 0937     Subjective Patient reports no falls or LOB since last session. She has been compliant with HEP but is not walking due to not having someone to walk with her outside.    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 Patient has attended OP PT in the past year for LLE strengthening and balance with positive results, but has not been in attendance recently due to COVID-19 pandemic. PMH includes osteoporosis, anemia, celiac disease, hyperlipidemia, hx of R upper lobe mass with resection 6/13, hx of pneumonia, meningioma, major atherosclerosis. VP shunt has been placed 06/2017.    Limitations Lifting;House hold activities;Standing;Walking    How long can you sit comfortably? n/a    How long can you stand comfortably? ~10 minutes (AM better than PM)    How long can you walk comfortably? a few mins until standing rest break with LLE off ground    Currently in Pain? No/denies                 Therex:    Nustep Lvl 1-3 seat position 9, L arm 11, R arm 10, 4 minutes for cardiovascular challenge. Very challenging for patient to perform.    Ambulate 590 ft with RW with cues for foot clearance and decreased episodes of freezing with close CGA. Patient challenged with turns, people passing her, changing surfaces, and fatigue.    6" step toe taps 12x each LE; BUE support 6" step step up/down BUE support 10x each LE; patient challenged  with reciprocating pattern 6" step lateral step up/down with BUE support    Seated:  RTB around bilateral ankles: alternating LAQ 12x each LE  RTB around bilateral ankles: alternating IR/ER 12x each LE      Neuro Re-ed: Next to support bar  Ambulate with SUE support to bar and back to chair (~ 4 ft each direction) LUE support with PT hand held position. Cue for not reaching early to chair. X1 trials Airex pad: static stand and let go with Ue's 30 seconds x 2 trials, very challenging for patient to let go with Ue's       Patient's vitals monitored throughout the session.  Pt educated throughout session about proper posture and technique with exercises. Improved exercise technique, movement at target joints, use of target  muscles after min to mod verbal, visual, tactile cues    Patient is highly motivated throughout physical therapy session. Step negotiation is very challenging for patient to perform with heavy reliance upon Ue's at this time. Patient is challenged with breaking tone in LLE and flexing leg to clear object/floor. Her static stability and strength is improving with decreased instability. The pt will benefit from further skilled PT services to improve gait pattern, LE strength and balance to increase QOL                    PT Education - 08/30/20 0934     Education provided Yes    Education Details exercise technique, body mechanics    Person(s) Educated Patient    Methods Explanation;Demonstration;Tactile cues;Verbal cues    Comprehension Verbalized understanding;Returned demonstration;Verbal cues required;Tactile cues required              PT Short Term Goals - 07/28/20 1033       PT SHORT TERM GOAL #1   Title Pt to demonstrate improved hip extension strength AEB ability to perform 5 full height hooklying bridges.    Baseline unable to rise from chair hands free due to hip extension weakness 6/15: reqiures SUE support for STS 7/13: didnt perform due to back pain    Time 4    Period Weeks    Status Partially Met    Target Date 07/28/20      PT SHORT TERM GOAL #2   Title Pt to demonstrate SLS balance >3 sec hands free to facilitate weight shift in gait and single limb gross motor coordination.    Baseline Requires modA for weightshift, unable to remain in SLS without BUE 4/4: unable to perform 5/11: 1-2 seconds 7/13: unable to perform    Time 4    Period Weeks    Status On-going    Target Date 07/28/20      PT SHORT TERM GOAL #3   Title Pt to demonstrate 5xSTS hands free from single airex pad in <20sec s LOB.    Baseline unable to rise from chair without BUE 4/4: 31.75 ft 6/15: 20.6 seconds with SUE support 7/13: 18 seconds with heavy BUE support ; 24 seconds     Time 4    Period Weeks    Status Partially Met    Target Date 07/28/20               PT Long Term Goals - 07/28/20 1030       PT LONG TERM GOAL #1   Title Patient will increase FOTO score to equal to or greater than 65% to demonstrate statistically significant improvement in mobility and quality of  life.    Baseline 10/20/19: 32% 11/24: 50.7%; 04/06/20 5/11: 45% 6/15: 45% 7/13: 51%    Time 12    Period Weeks    Status On-going    Target Date 09/22/20      PT LONG TERM GOAL #2   Title Patient will complete five times sit to stand hands free from chair + airex in < 15 seconds for increased coordination and ADL performance    Baseline Unable to rise from airex without use of hands 4/4: 31.75 seconds with no UE support 5/11: 17.23 seconds with BUE support 6/15: 20. 6 seconds with SUE support: 13.9 seconds with BUE support 7/13: 18 seconds with heavy BUE support ; 24 seconds with SUE with two near LOB (decreased)    Time 12    Period Weeks    Status On-going    Target Date 09/22/20      PT LONG TERM GOAL #3   Title Patient will walk with SUE support for 10 ft for increased stability and independence with mobility.    Baseline 6/15: able to walk 2-3 ft with heavy SUE support and max cueing 7/13: SUE (LUE) support 3 ft.    Time 12    Period Weeks    Status On-going    Target Date 09/22/20      PT LONG TERM GOAL #4   Title Pt to tolerate 6MWT c 4WW, supervision level assessment >1053f to facilitate confidence and safety in limited community distance AMB.    Baseline Not performed yet. 4/4: 673 ft with rollator 5/11: 665 ft with rollator 6/15: 702 ft with rollator 7/13: 545 ft with frequent standing rest breaks    Time 12    Period Weeks    Status Partially Met    Target Date 09/22/20      PT LONG TERM GOAL #5   Title Patient will static stand without UE support for 5 minutes for ADL performance and decreased reliance upon UE's.    Baseline 7/13/ 1 min 36 seconds    Time 12     Period Weeks    Status Partially Met    Target Date 09/22/20                   Plan - 08/30/20 1004     Clinical Impression Statement Patient is highly motivated throughout physical therapy session. Step negotiation is very challenging for patient to perform with heavy reliance upon Ue's at this time. Patient is challenged with breaking tone in LLE and flexing leg to clear object/floor. Her static stability and strength is improving with decreased instability. The pt will benefit from further skilled PT services to improve gait pattern, LE strength and balance to increase QOL    Personal Factors and Comorbidities Time since onset of injury/illness/exacerbation;Age;Comorbidity 3+;Past/Current Experience;Transportation    Comorbidities osteoporosis, anemia, hx cancer, hyperlipidemia    Examination-Activity Limitations Bathing;Bed Mobility;Dressing;Reach Overhead;Stairs;Stand;Toileting;Locomotion Level;Squat;Transfers;Bend;Hygiene/Grooming;Carry;Sit    Examination-Participation Restrictions Community Activity;Driving;Shop;Yard Work;Cleaning;Meal Prep    Stability/Clinical Decision Making Evolving/Moderate complexity    Rehab Potential Good    PT Frequency 2x / week    PT Duration 8 weeks    PT Treatment/Interventions ADLs/Self Care Home Management;Aquatic Therapy;Cryotherapy;Electrical Stimulation;DME Instruction;Gait training;Stair training;Functional mobility training;Therapeutic activities;Therapeutic exercise;Balance training;Neuromuscular re-education;Patient/family education;Manual techniques;Passive range of motion;Dry needling;Spinal Manipulations;Joint Manipulations;Orthotic Fit/Training;Energy conservation    PT Next Visit Plan Reducing shuffling gait with turns. LLE strengthening. continue POC as previously indicated    PT Home Exercise Plan not administered this date  Consulted and Agree with Plan of Care Patient             Patient will benefit from skilled  therapeutic intervention in order to improve the following deficits and impairments:  Abnormal gait, Decreased balance, Decreased mobility, Difficulty walking, Hypomobility, Decreased strength, Decreased knowledge of use of DME, Decreased coordination, Postural dysfunction, Impaired flexibility, Improper body mechanics, Impaired perceived functional ability, Decreased activity tolerance  Visit Diagnosis: Other abnormalities of gait and mobility  Muscle weakness (generalized)  Unsteadiness on feet     Problem List Patient Active Problem List   Diagnosis Date Noted   Cognitive deficits    Labile blood pressure    Acute blood loss anemia    Slow transit constipation    Vascular headache    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) 08/04/2014   Age related osteoporosis 11/26/2013   Absolute anemia 05/21/2013   H/O malignant neoplasm 05/21/2013   HLD (hyperlipidemia) 05/21/2013    Janna Arch, PT, DPT  08/30/2020, 10:15 AM  [place] [facility] MAIN [facility] SERVICES [address] [place], [place], [address] Phone: [phone]   Fax:  [phone]  Name: Krista Evans MRN: [id number] Date of Birth: 1945/12/12

## 2020-09-01 ENCOUNTER — Ambulatory Visit: Payer: PPO

## 2020-09-01 ENCOUNTER — Other Ambulatory Visit: Payer: Self-pay

## 2020-09-01 DIAGNOSIS — M6281 Muscle weakness (generalized): Secondary | ICD-10-CM

## 2020-09-01 DIAGNOSIS — R2689 Other abnormalities of gait and mobility: Secondary | ICD-10-CM | POA: Diagnosis not present

## 2020-09-01 DIAGNOSIS — R2681 Unsteadiness on feet: Secondary | ICD-10-CM

## 2020-09-01 NOTE — Therapy (Signed)
[place] MAIN [facility] SERVICES [address] [place], [place], [address] Phone: [phone]   Fax:  [phone]  Physical Therapy Treatment  Patient Details  Name: Krista Evans MRN: [id number] Date of Birth: 1945/05/21 Referring Provider (PT): Ramonita Lab   Encounter Date: 09/01/2020   PT End of Session - 09/01/20 0935     Visit Number 11    Number of Visits 82    Date for PT Re-Evaluation 09/22/20    Authorization Type 9/10 PN 07/28/20    Authorization Time Period 02/10/20-04/06/20; 04/06/20-06/29/20    PT Start Time 0930    PT Stop Time 1015    PT Time Calculation (min) 45 min    Equipment Utilized During Treatment Gait belt    Activity Tolerance Patient tolerated treatment well;No increased pain    Behavior During Therapy WFL for tasks assessed/performed             Past Medical History:  Diagnosis Date   Abnormal Q waves on electrocardiogram    Anemia    Aortic atherosclerosis (HCC)    "I could possibly have it, my grandmother had it"   Cancer ([place]) 05/2011   Right upper Lung CA with partial Lobectomy.   Celiac disease    Celiac disease    Dyspnea    with exertion   Hyperlipidemia    Hyperlipidemia    Lung mass    Meningioma (HCC)    Osteoporosis    Personal history of chemotherapy    Personal history of radiation therapy     Past Surgical History:  Procedure Laterality Date   LUNG LOBECTOMY     right lung   VENTRICULOPERITONEAL SHUNT Right 07/03/2017   Procedure: SHUNT INSERTION VENTRICULAR-PERITONEAL;  Surgeon: Newman Pies, MD;  Location: [facility];  Service: Neurosurgery;  Laterality: Right;    There were no vitals filed for this visit.   Subjective Assessment - 09/01/20 0934     Subjective Patient reports no falls or LOB since last session. Has been compliant with HEP.    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 Patient has attended  OP PT in the past year for LLE strengthening and balance with positive results, but has not been in attendance recently due to COVID-19 pandemic. PMH includes osteoporosis, anemia, celiac disease, hyperlipidemia, hx of R upper lobe mass with resection 6/13, hx of pneumonia, meningioma, major atherosclerosis. VP shunt has been placed 06/2017.    Limitations Lifting;House hold activities;Standing;Walking    How long can you sit comfortably? n/a    How long can you stand comfortably? ~10 minutes (AM better than PM)    How long can you walk comfortably? a few mins until standing rest break with LLE off ground    Currently in Pain? No/denies                    Treatment:  ambulate across stable and unstable surface outside. Negotiating changing surfaces from grass to sidewalk, across brick with turns and obstacles in pathway without LOB. Incline/decline negotiation with 4ww, cues for keeping self close to walker, large steps and to decrease dragging of R foot  Standing reach for top of rollator for stabilization training x 5 trials no LOB Sit to stands 5x   Seated:  RTB around bilateral knees: abduction 20x  RTB marches 15x each LE Heel toe raises 15x     Pt educated throughout session about  proper posture and technique with exercises. Improved exercise technique, movement at target joints, use of target muscles after min to mod verbal, visual, tactile cues.    Patient is able to ambulate prolonged distances with occasional standing rest breaks.  Standing negotiation and reach tolerated well however patient is very fearful initiatively. Decline negotiation is more challenging than incline with patient verbalizing fear of instability. The pt will benefit from further skilled PT services to improve gait pattern, LE strength and balance to increase QOL           PT Education - 09/01/20 0934     Education provided Yes    Education Details exercise technique, body mechanics     Person(s) Educated Patient    Methods Explanation;Demonstration;Tactile cues;Verbal cues    Comprehension Verbalized understanding;Returned demonstration;Verbal cues required;Tactile cues required              PT Short Term Goals - 07/28/20 1033       PT SHORT TERM GOAL #1   Title Pt to demonstrate improved hip extension strength AEB ability to perform 5 full height hooklying bridges.    Baseline unable to rise from chair hands free due to hip extension weakness 6/15: reqiures SUE support for STS 7/13: didnt perform due to back pain    Time 4    Period Weeks    Status Partially Met    Target Date 07/28/20      PT SHORT TERM GOAL #2   Title Pt to demonstrate SLS balance >3 sec hands free to facilitate weight shift in gait and single limb gross motor coordination.    Baseline Requires modA for weightshift, unable to remain in SLS without BUE 4/4: unable to perform 5/11: 1-2 seconds 7/13: unable to perform    Time 4    Period Weeks    Status On-going    Target Date 07/28/20      PT SHORT TERM GOAL #3   Title Pt to demonstrate 5xSTS hands free from single airex pad in <20sec s LOB.    Baseline unable to rise from chair without BUE 4/4: 31.75 ft 6/15: 20.6 seconds with SUE support 7/13: 18 seconds with heavy BUE support ; 24 seconds    Time 4    Period Weeks    Status Partially Met    Target Date 07/28/20               PT Long Term Goals - 07/28/20 1030       PT LONG TERM GOAL #1   Title Patient will increase FOTO score to equal to or greater than 65% to demonstrate statistically significant improvement in mobility and quality of life.    Baseline 10/20/19: 32% 11/24: 50.7%; 04/06/20 5/11: 45% 6/15: 45% 7/13: 51%    Time 12    Period Weeks    Status On-going    Target Date 09/22/20      PT LONG TERM GOAL #2   Title Patient will complete five times sit to stand hands free from chair + airex in < 15 seconds for increased coordination and ADL performance    Baseline  Unable to rise from airex without use of hands 4/4: 31.75 seconds with no UE support 5/11: 17.23 seconds with BUE support 6/15: 20. 6 seconds with SUE support: 13.9 seconds with BUE support 7/13: 18 seconds with heavy BUE support ; 24 seconds with SUE with two near LOB (decreased)    Time 12    Period Weeks  Status On-going    Target Date 09/22/20      PT LONG TERM GOAL #3   Title Patient will walk with SUE support for 10 ft for increased stability and independence with mobility.    Baseline 6/15: able to walk 2-3 ft with heavy SUE support and max cueing 7/13: SUE (LUE) support 3 ft.    Time 12    Period Weeks    Status On-going    Target Date 09/22/20      PT LONG TERM GOAL #4   Title Pt to tolerate 6MWT c 4WW, supervision level assessment >1025f to facilitate confidence and safety in limited community distance AMB.    Baseline Not performed yet. 4/4: 673 ft with rollator 5/11: 665 ft with rollator 6/15: 702 ft with rollator 7/13: 545 ft with frequent standing rest breaks    Time 12    Period Weeks    Status Partially Met    Target Date 09/22/20      PT LONG TERM GOAL #5   Title Patient will static stand without UE support for 5 minutes for ADL performance and decreased reliance upon UE's.    Baseline 7/13/ 1 min 36 seconds    Time 12    Period Weeks    Status Partially Met    Target Date 09/22/20                   Plan - 09/01/20 1138     Clinical Impression Statement Patient is able to ambulate prolonged distances with occasional standing rest breaks.  Standing negotiation and reach tolerated well however patient is very fearful initiatively. Decline negotiation is more challenging than incline with patient verbalizing fear of instability. The pt will benefit from further skilled PT services to improve gait pattern, LE strength and balance to increase QOL    Personal Factors and Comorbidities Time since onset of injury/illness/exacerbation;Age;Comorbidity  3+;Past/Current Experience;Transportation    Comorbidities osteoporosis, anemia, hx cancer, hyperlipidemia    Examination-Activity Limitations Bathing;Bed Mobility;Dressing;Reach Overhead;Stairs;Stand;Toileting;Locomotion Level;Squat;Transfers;Bend;Hygiene/Grooming;Carry;Sit    Examination-Participation Restrictions Community Activity;Driving;Shop;Yard Work;Cleaning;Meal Prep    Stability/Clinical Decision Making Evolving/Moderate complexity    Rehab Potential Good    PT Frequency 2x / week    PT Duration 8 weeks    PT Treatment/Interventions ADLs/Self Care Home Management;Aquatic Therapy;Cryotherapy;Electrical Stimulation;DME Instruction;Gait training;Stair training;Functional mobility training;Therapeutic activities;Therapeutic exercise;Balance training;Neuromuscular re-education;Patient/family education;Manual techniques;Passive range of motion;Dry needling;Spinal Manipulations;Joint Manipulations;Orthotic Fit/Training;Energy conservation    PT Next Visit Plan Reducing shuffling gait with turns. LLE strengthening. continue POC as previously indicated    PT Home Exercise Plan not administered this date    Consulted and Agree with Plan of Care Patient             Patient will benefit from skilled therapeutic intervention in order to improve the following deficits and impairments:  Abnormal gait, Decreased balance, Decreased mobility, Difficulty walking, Hypomobility, Decreased strength, Decreased knowledge of use of DME, Decreased coordination, Postural dysfunction, Impaired flexibility, Improper body mechanics, Impaired perceived functional ability, Decreased activity tolerance  Visit Diagnosis: Other abnormalities of gait and mobility  Muscle weakness (generalized)  Unsteadiness on feet     Problem List Patient Active Problem List   Diagnosis Date Noted   Cognitive deficits    Labile blood pressure    Acute blood loss anemia    Slow transit constipation    Vascular headache     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 [place], [place], [address] Phone: [phone]   Fax:  [phone]  Name: Krista Evans MRN: [id number] Date of Birth: 1945/11/21

## 2020-09-03 DIAGNOSIS — M810 Age-related osteoporosis without current pathological fracture: Secondary | ICD-10-CM | POA: Diagnosis not present

## 2020-09-06 ENCOUNTER — Other Ambulatory Visit: Payer: Self-pay

## 2020-09-06 ENCOUNTER — Ambulatory Visit: Payer: PPO

## 2020-09-06 DIAGNOSIS — R2681 Unsteadiness on feet: Secondary | ICD-10-CM

## 2020-09-06 DIAGNOSIS — M6281 Muscle weakness (generalized): Secondary | ICD-10-CM

## 2020-09-06 DIAGNOSIS — R2689 Other abnormalities of gait and mobility: Secondary | ICD-10-CM | POA: Diagnosis not present

## 2020-09-06 NOTE — Therapy (Signed)
[place] MAIN [facility] SERVICES [address] [place], [place], [address] Phone: [phone]   Fax:  [phone]  Physical Therapy Treatment Physical Therapy Progress Note   Dates of reporting period  07/28/20   to   09/06/20   Patient Details  Name: Krista Evans MRN: [id number] Date of Birth: May 05, 1945 Referring Provider (PT): Ramonita Lab   Encounter Date: 09/06/2020   PT End of Session - 09/06/20 1028     Visit Number 70    Number of Visits 32    Date for PT Re-Evaluation 09/22/20    Authorization Type 10/10 PN 07/28/20; next session 1/10 PN 8/22    Authorization Time Period 02/10/20-04/06/20; 04/06/20-06/29/20    PT Start Time 0930    PT Stop Time 1016    PT Time Calculation (min) 46 min    Equipment Utilized During Treatment Gait belt    Activity Tolerance Patient tolerated treatment well;No increased pain    Behavior During Therapy WFL for tasks assessed/performed             Past Medical History:  Diagnosis Date   Abnormal Q waves on electrocardiogram    Anemia    Aortic atherosclerosis (HCC)    "I could possibly have it, my grandmother had it"   Cancer ([place]) 05/2011   Right upper Lung CA with partial Lobectomy.   Celiac disease    Celiac disease    Dyspnea    with exertion   Hyperlipidemia    Hyperlipidemia    Lung mass    Meningioma (HCC)    Osteoporosis    Personal history of chemotherapy    Personal history of radiation therapy     Past Surgical History:  Procedure Laterality Date   LUNG LOBECTOMY     right lung   VENTRICULOPERITONEAL SHUNT Right 07/03/2017   Procedure: SHUNT INSERTION VENTRICULAR-PERITONEAL;  Surgeon: Newman Pies, MD;  Location: [place];  Service: Neurosurgery;  Laterality: Right;    There were no vitals filed for this visit.   Subjective Assessment - 09/06/20 1115     Subjective Patient reports she is nervous to do her goals today.    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 Patient has attended OP PT in the past year for LLE strengthening and balance with positive results, but has not been in attendance recently due to COVID-19 pandemic. PMH includes osteoporosis, anemia, celiac disease, hyperlipidemia, hx of R upper lobe mass with resection 6/13, hx of pneumonia, meningioma, major atherosclerosis. VP shunt has been placed 06/2017.    Limitations Lifting;House hold activities;Standing;Walking    How long can you sit comfortably? n/a    How long can you stand comfortably? ~10 minutes (AM better than PM)    How long can you walk comfortably? a few mins until standing rest break with LLE off ground    Currently in Pain? No/denies                   Progress note SLS: unable to perform  5xSTS on airex pad: 20 seconds with SUE support, one near LOB  FOTO: 67%  Walk 10 ft with SUE support: 10 ft with one rest break and one near LOB  6 MWT : 610 with frequent standing rest breaks  Static stand without UE support for 5 minutes : 3 mins 6 seconds   Seated: 3lb ankle weight:  -LAQ holding for 3 seconds;  10x each LE  -march 10x each LE  Patient's condition has the potential to improve in response to therapy. Maximum improvement is yet to be obtained. The anticipated improvement is attainable and reasonable in a generally predictable time.  Patient reports she is still fearful of falling at this time.    Patient has excellent progression towards functional goals at this time. Her sit to stands and walking has improved as well as ability to ambulate with SUE support. Patient remains highly fearful of falling and is unable to fully let go of UE support to move LE's, she is able to static stand without Ue's however indicating progression. atient's condition has the potential to improve in response to therapy. Maximum improvement is yet to be obtained. The anticipated improvement is attainable and  reasonable in a generally predictable time.The pt will benefit from further skilled PT services to improve gait pattern, LE strength and balance to increase QOL                 PT Education - 09/06/20 1115     Education provided Yes    Education Details exercise technique, body mechanics    Person(s) Educated Patient    Methods Explanation;Demonstration;Tactile cues;Verbal cues    Comprehension Verbalized understanding;Returned demonstration;Verbal cues required;Tactile cues required              PT Short Term Goals - 09/06/20 1025       PT SHORT TERM GOAL #1   Title Pt to demonstrate improved hip extension strength AEB ability to perform 5 full height hooklying bridges.    Baseline unable to rise from chair hands free due to hip extension weakness 6/15: reqiures SUE support for STS 7/13: didnt perform due to back pain    Time 4    Period Weeks    Status Partially Met    Target Date 07/28/20      PT SHORT TERM GOAL #2   Title Pt to demonstrate SLS balance >3 sec hands free to facilitate weight shift in gait and single limb gross motor coordination.    Baseline Requires modA for weightshift, unable to remain in SLS without BUE 4/4: unable to perform 5/11: 1-2 seconds 7/13: unable to perform 8/22 :unable to perform    Time 4    Period Weeks    Status On-going    Target Date 07/28/20      PT SHORT TERM GOAL #3   Title Pt to demonstrate 5xSTS hands free from single airex pad in <20sec s LOB.    Baseline unable to rise from chair without BUE 4/4: 31.75 ft 6/15: 20.6 seconds with SUE support 7/13: 18 seconds with heavy BUE support ; 24 seconds 8/22: 20 seconds one LOB with SUE support    Time 4    Period Weeks    Status Partially Met    Target Date 07/28/20               PT Long Term Goals - 09/06/20 1003       PT LONG TERM GOAL #1   Title Patient will increase FOTO score to equal to or greater than 65% to demonstrate statistically significant improvement  in mobility and quality of life.    Baseline 10/20/19: 32% 11/24: 50.7%; 04/06/20 5/11: 45% 6/15: 45% 7/13: 51% 8/22: 67%    Time 12    Period Weeks    Status Partially Met    Target Date 09/22/20      PT LONG  TERM GOAL #2   Title Patient will complete five times sit to stand hands free from chair + airex in < 15 seconds for increased coordination and ADL performance    Baseline Unable to rise from airex without use of hands 4/4: 31.75 seconds with no UE support 5/11: 17.23 seconds with BUE support 6/15: 20. 6 seconds with SUE support: 13.9 seconds with BUE support 7/13: 18 seconds with heavy BUE support ; 24 seconds with SUE with two near LOB (decreased) 8/22: 20 seconds with SUE support one near LOB    Time 12    Period Weeks    Status Partially Met    Target Date 09/22/20      PT LONG TERM GOAL #3   Title Patient will walk with SUE support for 10 ft for increased stability and independence with mobility.    Baseline 6/15: able to walk 2-3 ft with heavy SUE support and max cueing 7/13: SUE (LUE) support 3 ft. 8/22: 10 ft with one rest break and one near LOB    Time 12    Period Weeks    Status Partially Met    Target Date 09/22/20      PT LONG TERM GOAL #4   Title Pt to tolerate 6MWT c 4WW, supervision level assessment >1033f to facilitate confidence and safety in limited community distance AMB.    Baseline Not performed yet. 4/4: 673 ft with rollator 5/11: 665 ft with rollator 6/15: 702 ft with rollator 7/13: 545 ft with frequent standing rest breaks 8/22: 610 ft with frequent standing rest breaks    Time 12    Period Weeks    Status Partially Met    Target Date 09/22/20      PT LONG TERM GOAL #5   Title Patient will static stand without UE support for 5 minutes for ADL performance and decreased reliance upon UE's.    Baseline 7/13/ 1 min 36 seconds 8/22: 3 mins 6 seconds    Time 12    Period Weeks    Status Partially Met    Target Date 09/22/20                    Plan - 09/06/20 1119     Clinical Impression Statement Patient has excellent progression towards functional goals at this time. Her sit to stands and walking has improved as well as ability to ambulate with SUE support. Patient remains highly fearful of falling and is unable to fully let go of UE support to move LE's, she is able to static stand without Ue's however indicating progression. atient's condition has the potential to improve in response to therapy. Maximum improvement is yet to be obtained. The anticipated improvement is attainable and reasonable in a generally predictable time.The pt will benefit from further skilled PT services to improve gait pattern, LE strength and balance to increase QOL    Personal Factors and Comorbidities Time since onset of injury/illness/exacerbation;Age;Comorbidity 3+;Past/Current Experience;Transportation    Comorbidities osteoporosis, anemia, hx cancer, hyperlipidemia    Examination-Activity Limitations Bathing;Bed Mobility;Dressing;Reach Overhead;Stairs;Stand;Toileting;Locomotion Level;Squat;Transfers;Bend;Hygiene/Grooming;Carry;Sit    Examination-Participation Restrictions Community Activity;Driving;Shop;Yard Work;Cleaning;Meal Prep    Stability/Clinical Decision Making Evolving/Moderate complexity    Rehab Potential Good    PT Frequency 2x / week    PT Duration 8 weeks    PT Treatment/Interventions ADLs/Self Care Home Management;Aquatic Therapy;Cryotherapy;Electrical Stimulation;DME Instruction;Gait training;Stair training;Functional mobility training;Therapeutic activities;Therapeutic exercise;Balance training;Neuromuscular re-education;Patient/family education;Manual techniques;Passive range of motion;Dry needling;Spinal Manipulations;Joint Manipulations;Orthotic Fit/Training;Energy conservation  PT Next Visit Plan Reducing shuffling gait with turns. LLE strengthening. continue POC as previously indicated    PT Home Exercise Plan not administered this  date    Consulted and Agree with Plan of Care Patient             Patient will benefit from skilled therapeutic intervention in order to improve the following deficits and impairments:  Abnormal gait, Decreased balance, Decreased mobility, Difficulty walking, Hypomobility, Decreased strength, Decreased knowledge of use of DME, Decreased coordination, Postural dysfunction, Impaired flexibility, Improper body mechanics, Impaired perceived functional ability, Decreased activity tolerance  Visit Diagnosis: Other abnormalities of gait and mobility  Muscle weakness (generalized)  Unsteadiness on feet     Problem List Patient Active Problem List   Diagnosis Date Noted   Cognitive deficits    Labile blood pressure    Acute blood loss anemia    Slow transit constipation    Vascular headache    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   Hydrocephalus ([place]) 07/03/2017   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

## 2020-09-08 ENCOUNTER — Ambulatory Visit: Payer: PPO

## 2020-09-08 ENCOUNTER — Other Ambulatory Visit: Payer: Self-pay

## 2020-09-08 DIAGNOSIS — R2681 Unsteadiness on feet: Secondary | ICD-10-CM

## 2020-09-08 DIAGNOSIS — M6281 Muscle weakness (generalized): Secondary | ICD-10-CM

## 2020-09-08 DIAGNOSIS — R2689 Other abnormalities of gait and mobility: Secondary | ICD-10-CM

## 2020-09-08 NOTE — Therapy (Signed)
[place] MAIN [facility] SERVICES [address] [place], [place], [address] Phone: [phone]   Fax:  [phone]  Physical Therapy Treatment  Patient Details  Name: Krista Evans MRN: [id number] Date of Birth: 1945/04/26 Referring Provider (PT): Ramonita Lab   Encounter Date: 09/08/2020   PT End of Session - 09/08/20 1010     Visit Number 41    Number of Visits 81    Date for PT Re-Evaluation 09/22/20    Authorization Type 1/10 PN 8/22    Authorization Time Period 02/10/20-04/06/20; 04/06/20-06/29/20    PT Start Time 1014    PT Stop Time 1059    PT Time Calculation (min) 45 min    Equipment Utilized During Treatment Gait belt    Activity Tolerance Patient tolerated treatment well;No increased pain    Behavior During Therapy WFL for tasks assessed/performed             Past Medical History:  Diagnosis Date   Abnormal Q waves on electrocardiogram    Anemia    Aortic atherosclerosis (HCC)    "I could possibly have it, my grandmother had it"   Cancer ([place]) 05/2011   Right upper Lung CA with partial Lobectomy.   Celiac disease    Celiac disease    Dyspnea    with exertion   Hyperlipidemia    Hyperlipidemia    Lung mass    Meningioma (HCC)    Osteoporosis    Personal history of chemotherapy    Personal history of radiation therapy     Past Surgical History:  Procedure Laterality Date   LUNG LOBECTOMY     right lung   VENTRICULOPERITONEAL SHUNT Right 07/03/2017   Procedure: SHUNT INSERTION VENTRICULAR-PERITONEAL;  Surgeon: Newman Pies, MD;  Location: [place];  Service: Neurosurgery;  Laterality: Right;    There were no vitals filed for this visit.   Subjective Assessment - 09/08/20 1045     Subjective Patient reports compliance with HEP. No falls or LOB since last session.    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 Patient has attended OP PT in the  past year for LLE strengthening and balance with positive results, but has not been in attendance recently due to COVID-19 pandemic. PMH includes osteoporosis, anemia, celiac disease, hyperlipidemia, hx of R upper lobe mass with resection 6/13, hx of pneumonia, meningioma, major atherosclerosis. VP shunt has been placed 06/2017.    Limitations Lifting;House hold activities;Standing;Walking    How long can you sit comfortably? n/a    How long can you stand comfortably? ~10 minutes (AM better than PM)    How long can you walk comfortably? a few mins until standing rest break with LLE off ground    Currently in Pain? No/denies                 Treatment:  ambulate across stable and unstable surface outside. Negotiating changing surfaces from grass to sidewalk, across brick with turns and obstacles in pathway without LOB. Incline/decline negotiation with 4ww, cues for keeping self close to walker, large steps and to decrease dragging of R foot  Standing reach for top of rollator for stabilization training x 5 trials no LOB Sit to stands 5x      Seated:  RTB around bilateral knees: abduction 20x  RTB around bilateral ankles: alternating LAQ 12x each LE RTB marches 15x each LE Heel toe raises 15x  Pt educated throughout session about proper posture and technique with exercises. Improved exercise technique, movement at target joints, use of target muscles after min to mod verbal, visual, tactile cues.    Patient is able to negotiate changing surfaces from brick to cement well with decreased shuffle steppage. Patient is challenged with prolonged walking however and requires occasion standing rest breaks due to shortness of breath and limited ability to lift LLE upon command. The pt will benefit from further skilled PT services to improve gait pattern, LE strength and balance to increase QOL                    PT Education - 09/08/20 1011     Education provided Yes     Education Details exercise technique, body mechanics    Person(s) Educated Patient    Methods Explanation;Demonstration;Tactile cues;Verbal cues    Comprehension Verbalized understanding;Returned demonstration;Verbal cues required;Tactile cues required              PT Short Term Goals - 09/06/20 1025       PT SHORT TERM GOAL #1   Title Pt to demonstrate improved hip extension strength AEB ability to perform 5 full height hooklying bridges.    Baseline unable to rise from chair hands free due to hip extension weakness 6/15: reqiures SUE support for STS 7/13: didnt perform due to back pain    Time 4    Period Weeks    Status Partially Met    Target Date 07/28/20      PT SHORT TERM GOAL #2   Title Pt to demonstrate SLS balance >3 sec hands free to facilitate weight shift in gait and single limb gross motor coordination.    Baseline Requires modA for weightshift, unable to remain in SLS without BUE 4/4: unable to perform 5/11: 1-2 seconds 7/13: unable to perform 8/22 :unable to perform    Time 4    Period Weeks    Status On-going    Target Date 07/28/20      PT SHORT TERM GOAL #3   Title Pt to demonstrate 5xSTS hands free from single airex pad in <20sec s LOB.    Baseline unable to rise from chair without BUE 4/4: 31.75 ft 6/15: 20.6 seconds with SUE support 7/13: 18 seconds with heavy BUE support ; 24 seconds 8/22: 20 seconds one LOB with SUE support    Time 4    Period Weeks    Status Partially Met    Target Date 07/28/20               PT Long Term Goals - 09/06/20 1003       PT LONG TERM GOAL #1   Title Patient will increase FOTO score to equal to or greater than 65% to demonstrate statistically significant improvement in mobility and quality of life.    Baseline 10/20/19: 32% 11/24: 50.7%; 04/06/20 5/11: 45% 6/15: 45% 7/13: 51% 8/22: 67%    Time 12    Period Weeks    Status Partially Met    Target Date 09/22/20      PT LONG TERM GOAL #2   Title Patient will  complete five times sit to stand hands free from chair + airex in < 15 seconds for increased coordination and ADL performance    Baseline Unable to rise from airex without use of hands 4/4: 31.75 seconds with no UE support 5/11: 17.23 seconds with BUE support 6/15: 20. 6 seconds with SUE  support: 13.9 seconds with BUE support 7/13: 18 seconds with heavy BUE support ; 24 seconds with SUE with two near LOB (decreased) 8/22: 20 seconds with SUE support one near LOB    Time 12    Period Weeks    Status Partially Met    Target Date 09/22/20      PT LONG TERM GOAL #3   Title Patient will walk with SUE support for 10 ft for increased stability and independence with mobility.    Baseline 6/15: able to walk 2-3 ft with heavy SUE support and max cueing 7/13: SUE (LUE) support 3 ft. 8/22: 10 ft with one rest break and one near LOB    Time 12    Period Weeks    Status Partially Met    Target Date 09/22/20      PT LONG TERM GOAL #4   Title Pt to tolerate 6MWT c 4WW, supervision level assessment >1041f to facilitate confidence and safety in limited community distance AMB.    Baseline Not performed yet. 4/4: 673 ft with rollator 5/11: 665 ft with rollator 6/15: 702 ft with rollator 7/13: 545 ft with frequent standing rest breaks 8/22: 610 ft with frequent standing rest breaks    Time 12    Period Weeks    Status Partially Met    Target Date 09/22/20      PT LONG TERM GOAL #5   Title Patient will static stand without UE support for 5 minutes for ADL performance and decreased reliance upon UE's.    Baseline 7/13/ 1 min 36 seconds 8/22: 3 mins 6 seconds    Time 12    Period Weeks    Status Partially Met    Target Date 09/22/20                   Plan - 09/08/20 1052     Clinical Impression Statement Patient is able to negotiate changing surfaces from brick to cement well with decreased shuffle steppage. Patient is challenged with prolonged walking however and requires occasion standing rest  breaks due to shortness of breath and limited ability to lift LLE upon command. The pt will benefit from further skilled PT services to improve gait pattern, LE strength and balance to increase QOL    Personal Factors and Comorbidities Time since onset of injury/illness/exacerbation;Age;Comorbidity 3+;Past/Current Experience;Transportation    Comorbidities osteoporosis, anemia, hx cancer, hyperlipidemia    Examination-Activity Limitations Bathing;Bed Mobility;Dressing;Reach Overhead;Stairs;Stand;Toileting;Locomotion Level;Squat;Transfers;Bend;Hygiene/Grooming;Carry;Sit    Examination-Participation Restrictions Community Activity;Driving;Shop;Yard Work;Cleaning;Meal Prep    Stability/Clinical Decision Making Evolving/Moderate complexity    Rehab Potential Good    PT Frequency 2x / week    PT Duration 8 weeks    PT Treatment/Interventions ADLs/Self Care Home Management;Aquatic Therapy;Cryotherapy;Electrical Stimulation;DME Instruction;Gait training;Stair training;Functional mobility training;Therapeutic activities;Therapeutic exercise;Balance training;Neuromuscular re-education;Patient/family education;Manual techniques;Passive range of motion;Dry needling;Spinal Manipulations;Joint Manipulations;Orthotic Fit/Training;Energy conservation    PT Next Visit Plan Reducing shuffling gait with turns. LLE strengthening. continue POC as previously indicated    PT Home Exercise Plan not administered this date    Consulted and Agree with Plan of Care Patient             Patient will benefit from skilled therapeutic intervention in order to improve the following deficits and impairments:  Abnormal gait, Decreased balance, Decreased mobility, Difficulty walking, Hypomobility, Decreased strength, Decreased knowledge of use of DME, Decreased coordination, Postural dysfunction, Impaired flexibility, Improper body mechanics, Impaired perceived functional ability, Decreased activity tolerance  Visit  Diagnosis: Other abnormalities  of gait and mobility  Muscle weakness (generalized)  Unsteadiness on feet     Problem List Patient Active Problem List   Diagnosis Date Noted   Cognitive deficits    Labile blood pressure    Acute blood loss anemia    Slow transit constipation    Vascular headache    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

## 2020-09-09 DIAGNOSIS — R779 Abnormality of plasma protein, unspecified: Secondary | ICD-10-CM | POA: Diagnosis not present

## 2020-09-09 DIAGNOSIS — E039 Hypothyroidism, unspecified: Secondary | ICD-10-CM | POA: Diagnosis not present

## 2020-09-09 DIAGNOSIS — D649 Anemia, unspecified: Secondary | ICD-10-CM | POA: Diagnosis not present

## 2020-09-10 ENCOUNTER — Other Ambulatory Visit: Payer: Self-pay

## 2020-09-10 ENCOUNTER — Ambulatory Visit
Admission: RE | Admit: 2020-09-10 | Discharge: 2020-09-10 | Disposition: A | Discharge status: Home / Self Care | Payer: PPO | Source: Ambulatory Visit | Attending: Internal Medicine | Admitting: Internal Medicine

## 2020-09-10 DIAGNOSIS — R634 Abnormal weight loss: Secondary | ICD-10-CM | POA: Insufficient documentation

## 2020-09-10 DIAGNOSIS — E441 Mild protein-calorie malnutrition: Secondary | ICD-10-CM | POA: Diagnosis not present

## 2020-09-10 DIAGNOSIS — J432 Centrilobular emphysema: Secondary | ICD-10-CM | POA: Diagnosis not present

## 2020-09-10 DIAGNOSIS — I714 Abdominal aortic aneurysm, without rupture: Secondary | ICD-10-CM | POA: Diagnosis not present

## 2020-09-10 DIAGNOSIS — Z85118 Personal history of other malignant neoplasm of bronchus and lung: Secondary | ICD-10-CM | POA: Diagnosis not present

## 2020-09-10 DIAGNOSIS — S3282XA Multiple fractures of pelvis without disruption of pelvic ring, initial encounter for closed fracture: Secondary | ICD-10-CM | POA: Diagnosis not present

## 2020-09-10 LAB — POCT I-STAT CREATININE: Creatinine, Ser: 0.7 mg/dL (ref 0.44–1.00)

## 2020-09-10 MED ORDER — IOHEXOL 350 MG/ML SOLN
75.0000 mL | Freq: Once | INTRAVENOUS | Status: AC | PRN
  Administered 2020-09-10: 75 mL via INTRAVENOUS

## 2020-09-13 ENCOUNTER — Ambulatory Visit: Payer: PPO

## 2020-09-15 ENCOUNTER — Other Ambulatory Visit: Payer: Self-pay

## 2020-09-15 ENCOUNTER — Ambulatory Visit: Payer: PPO

## 2020-09-15 DIAGNOSIS — M6281 Muscle weakness (generalized): Secondary | ICD-10-CM

## 2020-09-15 DIAGNOSIS — R2681 Unsteadiness on feet: Secondary | ICD-10-CM

## 2020-09-15 DIAGNOSIS — R2689 Other abnormalities of gait and mobility: Secondary | ICD-10-CM

## 2020-09-15 DIAGNOSIS — I77810 Thoracic aortic ectasia: Secondary | ICD-10-CM

## 2020-09-15 HISTORY — DX: Thoracic aortic ectasia: I77.810

## 2020-09-15 NOTE — Therapy (Signed)
[place] MAIN [facility] SERVICES [address] [place], [place], [address] Phone: [phone]   Fax:  [phone]  Physical Therapy Treatment  Patient Details  Name: Krista Evans MRN: [id number] Date of Birth: [birth year] Referring Provider (PT): Ramonita Lab   Encounter Date: 09/15/2020   PT End of Session - 09/15/20 1010     Visit Number 72    Number of Visits 81    Date for PT Re-Evaluation 09/22/20    Authorization Type 2/10 PN 8/22    Authorization Time Period 02/10/20-04/06/20; 04/06/20-06/29/20    PT Start Time 1014    PT Stop Time 1059    PT Time Calculation (min) 45 min    Equipment Utilized During Treatment Gait belt    Activity Tolerance Patient tolerated treatment well;No increased pain    Behavior During Therapy WFL for tasks assessed/performed             Past Medical History:  Diagnosis Date   Abnormal Q waves on electrocardiogram    Anemia    Aortic atherosclerosis (HCC)    "I could possibly have it, my grandmother had it"   Cancer ([place]) 05/2011   Right upper Lung CA with partial Lobectomy.   Celiac disease    Celiac disease    Dyspnea    with exertion   Hyperlipidemia    Hyperlipidemia    Lung mass    Meningioma (HCC)    Osteoporosis    Personal history of chemotherapy    Personal history of radiation therapy     Past Surgical History:  Procedure Laterality Date   LUNG LOBECTOMY     right lung   VENTRICULOPERITONEAL SHUNT Right 07/03/2017   Procedure: SHUNT INSERTION VENTRICULAR-PERITONEAL;  Surgeon: Newman Pies, MD;  Location: [facility];  Service: Neurosurgery;  Laterality: Right;    There were no vitals filed for this visit.   Subjective Assessment - 09/15/20 1016     Subjective Patient reports she fell on monday, backing up  into bathroom, fell to ground and required help from husband to get back up.    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 Patient has attended OP PT in the past year for LLE strengthening and balance with positive results, but has not been in attendance recently due to COVID-19 pandemic. PMH includes osteoporosis, anemia, celiac disease, hyperlipidemia, hx of R upper lobe mass with resection 6/13, hx of pneumonia, meningioma, major atherosclerosis. VP shunt has been placed 06/2017.    Limitations Lifting;House hold activities;Standing;Walking    How long can you sit comfortably? n/a    How long can you stand comfortably? ~10 minutes (AM better than PM)    How long can you walk comfortably? a few mins until standing rest break with LLE off ground    Currently in Pain? No/denies                  Therex:    Nustep Lvl 1-3 seat position 9, L arm 11, R arm 10, 4 minutes for cardiovascular challenge. Very challenging for patient to perform.       6" step toe taps 12x each LE; BUE support cue with sticky note for foot placement  6" step step up/down BUE support 10x each LE; patient challenged with reciprocating pattern 6" step lateral step up/down with BUE support     Seated:  RTB around bilateral ankles: alternating LAQ 12x each LE  Neuro Re-ed: In // bars:  -forward backwards ambulation 8x length of // bars, cues for decreasing UE support; very slow very challenging for patient to step back with LLE.   Ambulate with SUE support to bar and back to chair (~ 4 ft each direction) LUE support with PT hand held position.  Cue for not reaching early to chair. X1 trials Airex pad: static stand and let go with Ue's 30 seconds x 2 trials, very challenging for patient to let go with Ue's       Patient's vitals monitored throughout the session.  Pt educated throughout session about proper posture and technique with exercises. Improved exercise technique, movement at target joints, use of target muscles after min to mod verbal, visual, tactile cues    Focus on backwards walking and  turning to prevent future falls performed and tolerated well despite patient fear of LOB. Her LLE is challenged with spatial awareness and sequencing. Step negotiation requires heavy BUE support with patient fatiguing after repetitions. The pt will benefit from further skilled PT services to improve gait pattern, LE strength and balance to increase QOL                  PT Education - 09/15/20 1010     Education provided Yes    Education Details exercise technique, body mechanics    Person(s) Educated Patient    Methods Explanation;Demonstration;Tactile cues;Verbal cues    Comprehension Verbalized understanding;Returned demonstration;Verbal cues required;Tactile cues required              PT Short Term Goals - 09/06/20 1025       PT SHORT TERM GOAL #1   Title Pt to demonstrate improved hip extension strength AEB ability to perform 5 full height hooklying bridges.    Baseline unable to rise from chair hands free due to hip extension weakness 6/15: reqiures SUE support for STS 7/13: didnt perform due to back pain    Time 4    Period Weeks    Status Partially Met    Target Date 07/28/20      PT SHORT TERM GOAL #2   Title Pt to demonstrate SLS balance >3 sec hands free to facilitate weight shift in gait and single limb gross motor coordination.    Baseline Requires modA for weightshift, unable to remain in SLS without BUE 4/4: unable to perform 5/11: 1-2 seconds 7/13: unable to perform 8/22 :unable to perform    Time 4    Period Weeks    Status On-going    Target Date 07/28/20      PT SHORT TERM GOAL #3   Title Pt to demonstrate 5xSTS hands free from single airex pad in <20sec s LOB.    Baseline unable to rise from chair without BUE 4/4: 31.75 ft 6/15: 20.6 seconds with SUE support 7/13: 18 seconds with heavy BUE support ; 24 seconds 8/22: 20 seconds one LOB with SUE support    Time 4    Period Weeks    Status Partially Met    Target Date 07/28/20                PT Long Term Goals - 09/06/20 1003       PT LONG TERM GOAL #1   Title Patient will increase FOTO score to equal to or greater than 65% to demonstrate statistically significant improvement in mobility and quality of life.    Baseline 10/20/19: 32% 11/24: 50.7%; 04/06/20 5/11: 45% 6/15: 45%  7/13: 51% 8/22: 67%    Time 12    Period Weeks    Status Partially Met    Target Date 09/22/20      PT LONG TERM GOAL #2   Title Patient will complete five times sit to stand hands free from chair + airex in < 15 seconds for increased coordination and ADL performance    Baseline Unable to rise from airex without use of hands 4/4: 31.75 seconds with no UE support 5/11: 17.23 seconds with BUE support 6/15: 20. 6 seconds with SUE support: 13.9 seconds with BUE support 7/13: 18 seconds with heavy BUE support ; 24 seconds with SUE with two near LOB (decreased) 8/22: 20 seconds with SUE support one near LOB    Time 12    Period Weeks    Status Partially Met    Target Date 09/22/20      PT LONG TERM GOAL #3   Title Patient will walk with SUE support for 10 ft for increased stability and independence with mobility.    Baseline 6/15: able to walk 2-3 ft with heavy SUE support and max cueing 7/13: SUE (LUE) support 3 ft. 8/22: 10 ft with one rest break and one near LOB    Time 12    Period Weeks    Status Partially Met    Target Date 09/22/20      PT LONG TERM GOAL #4   Title Pt to tolerate 6MWT c 4WW, supervision level assessment >1035f to facilitate confidence and safety in limited community distance AMB.    Baseline Not performed yet. 4/4: 673 ft with rollator 5/11: 665 ft with rollator 6/15: 702 ft with rollator 7/13: 545 ft with frequent standing rest breaks 8/22: 610 ft with frequent standing rest breaks    Time 12    Period Weeks    Status Partially Met    Target Date 09/22/20      PT LONG TERM GOAL #5   Title Patient will static stand without UE support for 5 minutes for ADL performance and  decreased reliance upon UE's.    Baseline 7/13/ 1 min 36 seconds 8/22: 3 mins 6 seconds    Time 12    Period Weeks    Status Partially Met    Target Date 09/22/20                   Plan - 09/15/20 1049     Clinical Impression Statement Focus on backwards walking and turning to prevent future falls performed and tolerated well despite patient fear of LOB. Her LLE is challenged with spatial awareness and sequencing. Step negotiation requires heavy BUE support with patient fatiguing after repetitions. The pt will benefit from further skilled PT services to improve gait pattern, LE strength and balance to increase QOL    Personal Factors and Comorbidities Time since onset of injury/illness/exacerbation;Age;Comorbidity 3+;Past/Current Experience;Transportation    Comorbidities osteoporosis, anemia, hx cancer, hyperlipidemia    Examination-Activity Limitations Bathing;Bed Mobility;Dressing;Reach Overhead;Stairs;Stand;Toileting;Locomotion Level;Squat;Transfers;Bend;Hygiene/Grooming;Carry;Sit    Examination-Participation Restrictions Community Activity;Driving;Shop;Yard Work;Cleaning;Meal Prep    Stability/Clinical Decision Making Evolving/Moderate complexity    Rehab Potential Good    PT Frequency 2x / week    PT Duration 8 weeks    PT Treatment/Interventions ADLs/Self Care Home Management;Aquatic Therapy;Cryotherapy;Electrical Stimulation;DME Instruction;Gait training;Stair training;Functional mobility training;Therapeutic activities;Therapeutic exercise;Balance training;Neuromuscular re-education;Patient/family education;Manual techniques;Passive range of motion;Dry needling;Spinal Manipulations;Joint Manipulations;Orthotic Fit/Training;Energy conservation    PT Next Visit Plan Reducing shuffling gait with turns. LLE strengthening. continue POC as  previously indicated    PT Home Exercise Plan not administered this date    Consulted and Agree with Plan of Care Patient              Patient will benefit from skilled therapeutic intervention in order to improve the following deficits and impairments:  Abnormal gait, Decreased balance, Decreased mobility, Difficulty walking, Hypomobility, Decreased strength, Decreased knowledge of use of DME, Decreased coordination, Postural dysfunction, Impaired flexibility, Improper body mechanics, Impaired perceived functional ability, Decreased activity tolerance  Visit Diagnosis: Other abnormalities of gait and mobility  Muscle weakness (generalized)  Unsteadiness on feet     Problem List Patient Active Problem List   Diagnosis Date Noted   Cognitive deficits    Labile blood pressure    Acute blood loss anemia    Slow transit constipation    Vascular headache    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   Hydrocephalus ([place]) 07/03/2017   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

## 2020-09-16 DIAGNOSIS — K900 Celiac disease: Secondary | ICD-10-CM | POA: Diagnosis not present

## 2020-09-16 DIAGNOSIS — D649 Anemia, unspecified: Secondary | ICD-10-CM | POA: Diagnosis not present

## 2020-09-16 DIAGNOSIS — M810 Age-related osteoporosis without current pathological fracture: Secondary | ICD-10-CM | POA: Diagnosis not present

## 2020-09-16 DIAGNOSIS — R768 Other specified abnormal immunological findings in serum: Secondary | ICD-10-CM | POA: Diagnosis not present

## 2020-09-16 DIAGNOSIS — E441 Mild protein-calorie malnutrition: Secondary | ICD-10-CM | POA: Diagnosis not present

## 2020-09-16 DIAGNOSIS — I714 Abdominal aortic aneurysm, without rupture: Secondary | ICD-10-CM | POA: Diagnosis not present

## 2020-09-16 DIAGNOSIS — G912 (Idiopathic) normal pressure hydrocephalus: Secondary | ICD-10-CM | POA: Diagnosis not present

## 2020-09-16 DIAGNOSIS — I700 Atherosclerosis of aorta: Secondary | ICD-10-CM | POA: Diagnosis not present

## 2020-09-16 DIAGNOSIS — E7849 Other hyperlipidemia: Secondary | ICD-10-CM | POA: Diagnosis not present

## 2020-09-16 DIAGNOSIS — D329 Benign neoplasm of meninges, unspecified: Secondary | ICD-10-CM | POA: Diagnosis not present

## 2020-09-16 DIAGNOSIS — I77810 Thoracic aortic ectasia: Secondary | ICD-10-CM | POA: Diagnosis not present

## 2020-09-22 ENCOUNTER — Other Ambulatory Visit: Payer: Self-pay

## 2020-09-22 ENCOUNTER — Ambulatory Visit: Payer: PPO | Attending: Internal Medicine

## 2020-09-22 DIAGNOSIS — R2681 Unsteadiness on feet: Secondary | ICD-10-CM | POA: Diagnosis not present

## 2020-09-22 DIAGNOSIS — R269 Unspecified abnormalities of gait and mobility: Secondary | ICD-10-CM | POA: Diagnosis not present

## 2020-09-22 DIAGNOSIS — M6281 Muscle weakness (generalized): Secondary | ICD-10-CM | POA: Insufficient documentation

## 2020-09-22 DIAGNOSIS — R2689 Other abnormalities of gait and mobility: Secondary | ICD-10-CM | POA: Diagnosis not present

## 2020-09-22 DIAGNOSIS — R278 Other lack of coordination: Secondary | ICD-10-CM | POA: Insufficient documentation

## 2020-09-22 NOTE — Therapy (Signed)
[place] MAIN [facility] SERVICES [address] [place], [place], [address] Phone: [phone]   Fax:  [phone]  Physical Therapy Treatment/RECERT  Patient Details  Name: Krista Evans MRN: [id number] Date of Birth: [birth year] Referring Provider (PT): Ramonita Lab   Encounter Date: 09/22/2020   PT End of Session - 09/22/20 0952     Visit Number 70    Number of Visits 89    Date for PT Re-Evaluation 11/17/20    Authorization Type 3/10 PN 8/22    Authorization Time Period 02/10/20-04/06/20; 04/06/20-06/29/20    PT Start Time 0930    PT Stop Time 1014    PT Time Calculation (min) 44 min    Equipment Utilized During Treatment Gait belt    Activity Tolerance Patient tolerated treatment well;No increased pain    Behavior During Therapy WFL for tasks assessed/performed             Past Medical History:  Diagnosis Date   Abnormal Q waves on electrocardiogram    Anemia    Aortic atherosclerosis (HCC)    "I could possibly have it, my grandmother had it"   Cancer ([place]) 05/2011   Right upper Lung CA with partial Lobectomy.   Celiac disease    Celiac disease    Dyspnea    with exertion   Hyperlipidemia    Hyperlipidemia    Lung mass    Meningioma (HCC)    Osteoporosis    Personal history of chemotherapy    Personal history of radiation therapy     Past Surgical History:  Procedure Laterality Date   LUNG LOBECTOMY     right lung   VENTRICULOPERITONEAL SHUNT Right 07/03/2017   Procedure: SHUNT INSERTION VENTRICULAR-PERITONEAL;  Surgeon: Newman Pies, MD;  Location: [facility];  Service: Neurosurgery;  Laterality: Right;    There were no vitals filed for this visit.   Subjective Assessment - 09/22/20 0949     Subjective Patient reports no falls or LOB since last session. Has been compliant with HEP.    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 Patient has  attended OP PT in the past year for LLE strengthening and balance with positive results, but has not been in attendance recently due to COVID-19 pandemic. PMH includes osteoporosis, anemia, celiac disease, hyperlipidemia, hx of R upper lobe mass with resection 6/13, hx of pneumonia, meningioma, major atherosclerosis. VP shunt has been placed 06/2017.    Limitations Lifting;House hold activities;Standing;Walking    How long can you sit comfortably? n/a    How long can you stand comfortably? ~10 minutes (AM better than PM)    How long can you walk comfortably? a few mins until standing rest break with LLE off ground    Currently in Pain? No/denies                Recert/goals SLS: 2 seconds RLE, unable to perform without UE support LLE  FOTO 60%  Add TUG goal: 35 seconds with RW   Goals performed 09/06/20 please refer to this note fur further details    Treatment  Therex:  Seated IR/ER step over hedgehog 10x each LE: very challenging LLE Standing: lateral step over and back half foam roller 10x each LE, BUE Support 10x STS is reduced upper extremity support     Neuro Re-ed:   Ambulate with SUE support to bar and back to chair (~ 4 ft each direction)  LUE support with PT hand held position.  Cue for not reaching early to chair. X8 trials; frequent shuffle steppage      Patient's vitals monitored throughout the session.  Pt educated throughout session about proper posture and technique with exercises. Improved exercise technique, movement at target joints, use of target muscles after min to mod verbal, visual, tactile cues    Note: Portions of this document were prepared using Dragon voice recognition software and although reviewed may contain unintentional dictation errors in syntax, grammar, or spelling.     Patient is progressing with functional goals, some goals performed on 09/06/20 please refer to this note for further details. New goal of TUG added due to patient limited  ability to turn from sitting to standing and immediately ambulating.  Has high fear of falling and was educated on need to perform tasks that are more uncomfortable with PT guarding to continue improving. Patient agreeable to current and additional new plan of care. The pt will benefit from further skilled PT services to improve gait pattern, LE strength and balance to increase QOL             Upper Extremity Functional Index Score :   /80   PT Education - 09/22/20 0951     Education provided Yes    Education Details goals, POC exercise technique    Person(s) Educated Patient    Methods Explanation;Demonstration;Tactile cues;Verbal cues    Comprehension Verbalized understanding;Returned demonstration;Verbal cues required;Tactile cues required              PT Short Term Goals - 09/22/20 0957       PT SHORT TERM GOAL #1   Title Pt to demonstrate improved hip extension strength AEB ability to perform 5 full height hooklying bridges.    Baseline unable to rise from chair hands free due to hip extension weakness 6/15: reqiures SUE support for STS 7/13: didnt perform due to back pain    Time 4    Period Weeks    Status Deferred      PT SHORT TERM GOAL #2   Title Pt to demonstrate SLS balance >3 sec hands free to facilitate weight shift in gait and single limb gross motor coordination.    Baseline Requires modA for weightshift, unable to remain in SLS without BUE 4/4: unable to perform 5/11: 1-2 seconds 7/13: unable to perform 8/22 :unable to perform 9/7: 2 seconds RLE unable to perform with LLE    Time 4    Period Weeks    Status Partially Met    Target Date 10/20/20      PT SHORT TERM GOAL #3   Title Pt to demonstrate 5xSTS hands free from single airex pad in <20sec s LOB.    Baseline unable to rise from chair without BUE 4/4: 31.75 ft 6/15: 20.6 seconds with SUE support 7/13: 18 seconds with heavy BUE support ; 24 seconds 8/22: 20 seconds one LOB with SUE support    Time  4    Period Weeks    Status Partially Met    Target Date 10/20/20               PT Long Term Goals - 09/22/20 0958       PT LONG TERM GOAL #1   Title Patient will increase FOTO score to equal to or greater than 65% to demonstrate statistically significant improvement in mobility and quality of life.    Baseline 10/20/19: 32% 11/24: 50.7%; 04/06/20 5/11:  45% 6/15: 45% 7/13: 51% 8/22: 67%    Time 12    Period Weeks    Status Achieved      PT LONG TERM GOAL #2   Title Patient will complete five times sit to stand hands free from chair + airex in < 15 seconds for increased coordination and ADL performance    Baseline Unable to rise from airex without use of hands 4/4: 31.75 seconds with no UE support 5/11: 17.23 seconds with BUE support 6/15: 20. 6 seconds with SUE support: 13.9 seconds with BUE support 7/13: 18 seconds with heavy BUE support ; 24 seconds with SUE with two near LOB (decreased) 8/22: 20 seconds with SUE support one near LOB    Time 8    Period Weeks    Status Partially Met    Target Date 11/17/20      PT LONG TERM GOAL #3   Title Patient will walk with SUE support for 10 ft for increased stability and independence with mobility.    Baseline 6/15: able to walk 2-3 ft with heavy SUE support and max cueing 7/13: SUE (LUE) support 3 ft. 8/22: 10 ft with one rest break and one near LOB    Time 8    Period Weeks    Status Partially Met    Target Date 11/17/20      PT LONG TERM GOAL #4   Title Pt to tolerate 6MWT c 4WW, supervision level assessment >1074f to facilitate confidence and safety in limited community distance AMB.    Baseline Not performed yet. 4/4: 673 ft with rollator 5/11: 665 ft with rollator 6/15: 702 ft with rollator 7/13: 545 ft with frequent standing rest breaks 8/22: 610 ft with frequent standing rest breaks    Time 8    Period Weeks    Status Partially Met    Target Date 11/17/20      PT LONG TERM GOAL #5   Title Patient will static stand  without UE support for 5 minutes for ADL performance and decreased reliance upon UE's.    Baseline 7/13/ 1 min 36 seconds 8/22: 3 mins 6 seconds    Time 8    Period Weeks    Status Partially Met    Target Date 11/17/20      PT LONG TERM GOAL #6   Title Patient will reduce timed up and go to <11 seconds to reduce fall risk and demonstrate improved transfer/gait ability.    Baseline 9/7: 35 seconds with RW    Time 8    Period Weeks    Status New    Target Date 11/17/20                   Plan - 09/22/20 1447     Clinical Impression Statement Patient is progressing with functional goals, some goals performed on 09/06/20 please refer to this note for further details. New goal of TUG added due to patient limited ability to turn from sitting to standing and immediately ambulating.  Has high fear of falling and was educated on need to perform tasks that are more uncomfortable with PT guarding to continue improving. Patient agreeable to current and additional new plan of care. The pt will benefit from further skilled PT services to improve gait pattern, LE strength and balance to increase QOL    Personal Factors and Comorbidities Time since onset of injury/illness/exacerbation;Age;Comorbidity 3+;Past/Current Experience;Transportation    Comorbidities osteoporosis, anemia, hx cancer, hyperlipidemia  Examination-Activity Limitations Bathing;Bed Mobility;Dressing;Reach Overhead;Stairs;Stand;Toileting;Locomotion Level;Squat;Transfers;Bend;Hygiene/Grooming;Carry;Sit    Examination-Participation Restrictions Community Activity;Driving;Shop;Yard Work;Cleaning;Meal Prep    Stability/Clinical Decision Making Evolving/Moderate complexity    Rehab Potential Good    PT Frequency 2x / week    PT Duration 8 weeks    PT Treatment/Interventions ADLs/Self Care Home Management;Aquatic Therapy;Cryotherapy;Electrical Stimulation;DME Instruction;Gait training;Stair training;Functional mobility  training;Therapeutic activities;Therapeutic exercise;Balance training;Neuromuscular re-education;Patient/family education;Manual techniques;Passive range of motion;Dry needling;Spinal Manipulations;Joint Manipulations;Orthotic Fit/Training;Energy conservation    PT Next Visit Plan Reducing shuffling gait with turns. LLE strengthening. continue POC as previously indicated    PT Home Exercise Plan not administered this date    Consulted and Agree with Plan of Care Patient             Patient will benefit from skilled therapeutic intervention in order to improve the following deficits and impairments:  Abnormal gait, Decreased balance, Decreased mobility, Difficulty walking, Hypomobility, Decreased strength, Decreased knowledge of use of DME, Decreased coordination, Postural dysfunction, Impaired flexibility, Improper body mechanics, Impaired perceived functional ability, Decreased activity tolerance  Visit Diagnosis: Other abnormalities of gait and mobility  Muscle weakness (generalized)  Unsteadiness on feet     Problem List Patient Active Problem List   Diagnosis Date Noted   Cognitive deficits    Labile blood pressure    Acute blood loss anemia    Slow transit constipation    Vascular headache    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 [place], [place], [address] Phone: [phone]   Fax:  [phone]  Name: Krista Evans MRN: [id number] Date of Birth: [birth year]

## 2020-09-27 ENCOUNTER — Other Ambulatory Visit: Payer: Self-pay

## 2020-09-27 ENCOUNTER — Ambulatory Visit: Payer: PPO

## 2020-09-27 DIAGNOSIS — R2681 Unsteadiness on feet: Secondary | ICD-10-CM

## 2020-09-27 DIAGNOSIS — D649 Anemia, unspecified: Secondary | ICD-10-CM | POA: Diagnosis not present

## 2020-09-27 DIAGNOSIS — R634 Abnormal weight loss: Secondary | ICD-10-CM | POA: Diagnosis not present

## 2020-09-27 DIAGNOSIS — R2689 Other abnormalities of gait and mobility: Secondary | ICD-10-CM

## 2020-09-27 DIAGNOSIS — M6281 Muscle weakness (generalized): Secondary | ICD-10-CM

## 2020-09-27 NOTE — Therapy (Signed)
[place] MAIN [facility] SERVICES [address] [place], [place], [address] Phone: [phone]   Fax:  [phone]  Physical Therapy Treatment  Patient Details  Name: Krista Evans MRN: [id number] Date of Birth: December 03, 1945 Referring Provider (PT): Ramonita Lab   Encounter Date: 09/27/2020   PT End of Session - 09/27/20 0937     Visit Number 54    Number of Visits 89    Date for PT Re-Evaluation 11/17/20    Authorization Type 4/10 PN 8/22    Authorization Time Period 02/10/20-04/06/20; 04/06/20-06/29/20    PT Start Time 0930    PT Stop Time 1014    PT Time Calculation (min) 44 min    Equipment Utilized During Treatment Gait belt    Activity Tolerance Patient tolerated treatment well;No increased pain    Behavior During Therapy WFL for tasks assessed/performed             Past Medical History:  Diagnosis Date   Abnormal Q waves on electrocardiogram    Anemia    Aortic atherosclerosis (HCC)    "I could possibly have it, my grandmother had it"   Cancer ([place]) 05/2011   Right upper Lung CA with partial Lobectomy.   Celiac disease    Celiac disease    Dyspnea    with exertion   Hyperlipidemia    Hyperlipidemia    Lung mass    Meningioma (HCC)    Osteoporosis    Personal history of chemotherapy    Personal history of radiation therapy     Past Surgical History:  Procedure Laterality Date   LUNG LOBECTOMY     right lung   VENTRICULOPERITONEAL SHUNT Right 07/03/2017   Procedure: SHUNT INSERTION VENTRICULAR-PERITONEAL;  Surgeon: Newman Pies, MD;  Location: [facility];  Service: Neurosurgery;  Laterality: Right;    There were no vitals filed for this visit.   Subjective Assessment - 09/27/20 0936     Subjective Patient reports she got a new walker on thursday. Has had a little bit of practice walking but not much, was fearful of walking down hill.    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 Patient has attended OP PT in the past year for LLE strengthening and balance with positive results, but has not been in attendance recently due to COVID-19 pandemic. PMH includes osteoporosis, anemia, celiac disease, hyperlipidemia, hx of R upper lobe mass with resection 6/13, hx of pneumonia, meningioma, major atherosclerosis. VP shunt has been placed 06/2017.    Limitations Lifting;House hold activities;Standing;Walking    How long can you sit comfortably? n/a    How long can you stand comfortably? ~10 minutes (AM better than PM)    How long can you walk comfortably? a few mins until standing rest break with LLE off ground    Currently in Pain? No/denies                       Therex:    Nustep Lvl 1-3 seat position 9, L arm 11, R arm 10, 4 minutes for cardiovascular challenge. Very challenging for patient to perform.     ambulate >600 ft with new trust walker/rollator. Close CGA with cues for sequencing placement of walker. Patient fearful of walker moving too quickly and too far in front of her, height adjusted.    Seated:  RTB around bilateral ankles: alternating LAQ 12x each LE  RTB  around bilateral  ankles: alternating ER/IR 12x each LE  RTB abduction 15x each LE   RTB alternating marches 12x each LE, cue for not holding onto chair.    Neuro Re-ed:    Ambulate with SUE support to bar and back to chair (~ 4 ft each direction) LUE support with PT hand held position.  Cue for not reaching early to chair. X4 trials Airex pad: static stand and let go with Ue's 30 seconds x 2 trials, very challenging for patient to let go with Ue's       Patient's vitals monitored throughout the session.  Pt educated throughout session about proper posture and technique with exercises. Improved exercise technique, movement at target joints, use of target muscles after min to mod verbal, visual, tactile cues    Patient requires education on proper  ambulation technique with new rollator due to pushing rollator too far in front of self and losing stability. She is highly motivated throughout physical therapy session. Seated interventions performed at end of session due to LE exhaustion. The pt will benefit from further skilled PT services to improve gait pattern, LE strength and balance to increase QOL               PT Education - 09/27/20 0936     Education provided Yes    Education Details exercise technique, body mechanics    Person(s) Educated Patient    Methods Explanation;Demonstration;Tactile cues;Verbal cues    Comprehension Verbalized understanding;Returned demonstration;Verbal cues required              PT Short Term Goals - 09/22/20 0957       PT SHORT TERM GOAL #1   Title Pt to demonstrate improved hip extension strength AEB ability to perform 5 full height hooklying bridges.    Baseline unable to rise from chair hands free due to hip extension weakness 6/15: reqiures SUE support for STS 7/13: didnt perform due to back pain    Time 4    Period Weeks    Status Deferred      PT SHORT TERM GOAL #2   Title Pt to demonstrate SLS balance >3 sec hands free to facilitate weight shift in gait and single limb gross motor coordination.    Baseline Requires modA for weightshift, unable to remain in SLS without BUE 4/4: unable to perform 5/11: 1-2 seconds 7/13: unable to perform 8/22 :unable to perform 9/7: 2 seconds RLE unable to perform with LLE    Time 4    Period Weeks    Status Partially Met    Target Date 10/20/20      PT SHORT TERM GOAL #3   Title Pt to demonstrate 5xSTS hands free from single airex pad in <20sec s LOB.    Baseline unable to rise from chair without BUE 4/4: 31.75 ft 6/15: 20.6 seconds with SUE support 7/13: 18 seconds with heavy BUE support ; 24 seconds 8/22: 20 seconds one LOB with SUE support    Time 4    Period Weeks    Status Partially Met    Target Date 10/20/20                PT Long Term Goals - 09/22/20 0958       PT LONG TERM GOAL #1   Title Patient will increase FOTO score to equal to or greater than 65% to demonstrate statistically significant improvement in mobility and quality of life.    Baseline 10/20/19: 32% 11/24:  50.7%; 04/06/20 5/11: 45% 6/15: 45% 7/13: 51% 8/22: 67%    Time 12    Period Weeks    Status Achieved      PT LONG TERM GOAL #2   Title Patient will complete five times sit to stand hands free from chair + airex in < 15 seconds for increased coordination and ADL performance    Baseline Unable to rise from airex without use of hands 4/4: 31.75 seconds with no UE support 5/11: 17.23 seconds with BUE support 6/15: 20. 6 seconds with SUE support: 13.9 seconds with BUE support 7/13: 18 seconds with heavy BUE support ; 24 seconds with SUE with two near LOB (decreased) 8/22: 20 seconds with SUE support one near LOB    Time 8    Period Weeks    Status Partially Met    Target Date 11/17/20      PT LONG TERM GOAL #3   Title Patient will walk with SUE support for 10 ft for increased stability and independence with mobility.    Baseline 6/15: able to walk 2-3 ft with heavy SUE support and max cueing 7/13: SUE (LUE) support 3 ft. 8/22: 10 ft with one rest break and one near LOB    Time 8    Period Weeks    Status Partially Met    Target Date 11/17/20      PT LONG TERM GOAL #4   Title Pt to tolerate 6MWT c 4WW, supervision level assessment >1075f to facilitate confidence and safety in limited community distance AMB.    Baseline Not performed yet. 4/4: 673 ft with rollator 5/11: 665 ft with rollator 6/15: 702 ft with rollator 7/13: 545 ft with frequent standing rest breaks 8/22: 610 ft with frequent standing rest breaks    Time 8    Period Weeks    Status Partially Met    Target Date 11/17/20      PT LONG TERM GOAL #5   Title Patient will static stand without UE support for 5 minutes for ADL performance and decreased reliance upon UE's.     Baseline 7/13/ 1 min 36 seconds 8/22: 3 mins 6 seconds    Time 8    Period Weeks    Status Partially Met    Target Date 11/17/20      PT LONG TERM GOAL #6   Title Patient will reduce timed up and go to <11 seconds to reduce fall risk and demonstrate improved transfer/gait ability.    Baseline 9/7: 35 seconds with RW    Time 8    Period Weeks    Status New    Target Date 11/17/20                   Plan - 09/27/20 1004     Clinical Impression Statement Patient requires education on proper ambulation technique with new rollator due to pushing rollator too far in front of self and losing stability. She is highly motivated throughout physical therapy session. Seated interventions performed at end of session due to LE exhaustion. The pt will benefit from further skilled PT services to improve gait pattern, LE strength and balance to increase QOL    Personal Factors and Comorbidities Time since onset of injury/illness/exacerbation;Age;Comorbidity 3+;Past/Current Experience;Transportation    Comorbidities osteoporosis, anemia, hx cancer, hyperlipidemia    Examination-Activity Limitations Bathing;Bed Mobility;Dressing;Reach Overhead;Stairs;Stand;Toileting;Locomotion Level;Squat;Transfers;Bend;Hygiene/Grooming;Carry;Sit    Examination-Participation Restrictions Community Activity;Driving;Shop;Yard Work;Cleaning;Meal Prep    Stability/Clinical Decision Making Evolving/Moderate complexity    Rehab  Potential Good    PT Frequency 2x / week    PT Duration 8 weeks    PT Treatment/Interventions ADLs/Self Care Home Management;Aquatic Therapy;Cryotherapy;Electrical Stimulation;DME Instruction;Gait training;Stair training;Functional mobility training;Therapeutic activities;Therapeutic exercise;Balance training;Neuromuscular re-education;Patient/family education;Manual techniques;Passive range of motion;Dry needling;Spinal Manipulations;Joint Manipulations;Orthotic Fit/Training;Energy conservation     PT Next Visit Plan Reducing shuffling gait with turns. LLE strengthening. continue POC as previously indicated    PT Home Exercise Plan not administered this date    Consulted and Agree with Plan of Care Patient             Patient will benefit from skilled therapeutic intervention in order to improve the following deficits and impairments:  Abnormal gait, Decreased balance, Decreased mobility, Difficulty walking, Hypomobility, Decreased strength, Decreased knowledge of use of DME, Decreased coordination, Postural dysfunction, Impaired flexibility, Improper body mechanics, Impaired perceived functional ability, Decreased activity tolerance  Visit Diagnosis: Other abnormalities of gait and mobility  Muscle weakness (generalized)  Unsteadiness on feet     Problem List Patient Active Problem List   Diagnosis Date Noted   Cognitive deficits    Labile blood pressure    Acute blood loss anemia    Slow transit constipation    Vascular headache    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 [place], [place], [address] Phone: [phone]   Fax:  [phone]  Name: Krista Evans MRN: [id number] Date of Birth: 1945-06-24

## 2020-09-29 ENCOUNTER — Ambulatory Visit: Payer: PPO | Admitting: Physical Therapy

## 2020-09-29 ENCOUNTER — Other Ambulatory Visit: Payer: Self-pay

## 2020-09-29 DIAGNOSIS — R2689 Other abnormalities of gait and mobility: Secondary | ICD-10-CM | POA: Diagnosis not present

## 2020-09-29 DIAGNOSIS — R2681 Unsteadiness on feet: Secondary | ICD-10-CM

## 2020-09-29 DIAGNOSIS — R269 Unspecified abnormalities of gait and mobility: Secondary | ICD-10-CM

## 2020-09-29 DIAGNOSIS — R278 Other lack of coordination: Secondary | ICD-10-CM

## 2020-09-29 DIAGNOSIS — M6281 Muscle weakness (generalized): Secondary | ICD-10-CM

## 2020-09-29 NOTE — Therapy (Signed)
[place] MAIN [facility] SERVICES [address] [place], [place], [address] Phone: [phone]   Fax:  [phone]  Physical Therapy Treatment  Patient Details  Name: Krista Evans MRN: [id number] Date of Birth: Nov 14, 1945 Referring Provider (PT): Ramonita Lab   Encounter Date: 09/29/2020   PT End of Session - 09/29/20 1035     Visit Number 19    Number of Visits 32    Date for PT Re-Evaluation 11/17/20    Authorization Type 4/10 PN 8/22    Authorization Time Period 02/10/20-04/06/20; 04/06/20-06/29/20    PT Start Time 0935    PT Stop Time 1015    PT Time Calculation (min) 40 min    Equipment Utilized During Treatment Gait belt    Activity Tolerance Patient tolerated treatment well;No increased pain    Behavior During Therapy WFL for tasks assessed/performed             Past Medical History:  Diagnosis Date   Abnormal Q waves on electrocardiogram    Anemia    Aortic atherosclerosis (HCC)    "I could possibly have it, my grandmother had it"   Cancer ([place]) 05/2011   Right upper Lung CA with partial Lobectomy.   Celiac disease    Celiac disease    Dyspnea    with exertion   Hyperlipidemia    Hyperlipidemia    Lung mass    Meningioma (HCC)    Osteoporosis    Personal history of chemotherapy    Personal history of radiation therapy     Past Surgical History:  Procedure Laterality Date   LUNG LOBECTOMY     right lung   VENTRICULOPERITONEAL SHUNT Right 07/03/2017   Procedure: SHUNT INSERTION VENTRICULAR-PERITONEAL;  Surgeon: Newman Pies, MD;  Location: [facility];  Service: Neurosurgery;  Laterality: Right;    There were no vitals filed for this visit.   Subjective Assessment - 09/29/20 0939     Subjective Patient reports she is well today. States she is still adjusting to her new walker. No falls since last session. No questions or concerns.    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 Patient has attended OP PT in the past year for LLE strengthening and balance with positive results, but has not been in attendance recently due to COVID-19 pandemic. PMH includes osteoporosis, anemia, celiac disease, hyperlipidemia, hx of R upper lobe mass with resection 6/13, hx of pneumonia, meningioma, major atherosclerosis. VP shunt has been placed 06/2017.    Limitations Lifting;House hold activities;Standing;Walking    How long can you sit comfortably? n/a    How long can you stand comfortably? ~10 minutes (AM better than PM)    How long can you walk comfortably? a few mins until standing rest break with LLE off ground    Currently in Pain? No/denies            Therex:    Nustep LE only Lvl 1-2 seat position 9, 4 minutes for cardiovascular challenge. Very challenging for patient to perform with fatigue reported.    Ambulate 348f x 2 trials with new trust walker/rollator. Close CGA with cues for sequencing placement of walker. Patient fearful of walker moving too quickly and too far in front of her.   Seated:  RTB around bilateral ankles: alternating LAQ 12x each LE  RTB around bilateral  ankles: alternating ER/IR 12x each LE RTB abduction 15x each LE  RTB  alternating marches 15x each LE, cue for not holding onto chair.    Neuro Re-ed:  Airex pad: static stand with no minimal UE support, 60 seconds x 2 trials, very challenging for patient to maintain no UE support due to consistent reaching for bars.     Pt educated throughout session about proper posture and technique with exercises. Improved exercise technique, movement at target joints, use of target muscles after min to mod verbal, visual, tactile cues     Patient pleasant and motivated throughout session. She reported fatigue while on NuStep asking to decrease level to a 2. Ambulation distance was decreased today due to fatigue as well. Pt came to a full stop multiple times during  ambulation with a festinating pattern to initiate steps after each stop and during turns. Decreased right foot clearance with cueing to correct, minimal correction noted. Seated interventions performed at end of session due to LE exhaustion. The pt will benefit from further skilled PT services to improve gait pattern, LE strength and balance to increase QOL.         PT Short Term Goals - 09/22/20 0957       PT SHORT TERM GOAL #1   Title Pt to demonstrate improved hip extension strength AEB ability to perform 5 full height hooklying bridges.    Baseline unable to rise from chair hands free due to hip extension weakness 6/15: reqiures SUE support for STS 7/13: didnt perform due to back pain    Time 4    Period Weeks    Status Deferred      PT SHORT TERM GOAL #2   Title Pt to demonstrate SLS balance >3 sec hands free to facilitate weight shift in gait and single limb gross motor coordination.    Baseline Requires modA for weightshift, unable to remain in SLS without BUE 4/4: unable to perform 5/11: 1-2 seconds 7/13: unable to perform 8/22 :unable to perform 9/7: 2 seconds RLE unable to perform with LLE    Time 4    Period Weeks    Status Partially Met    Target Date 10/20/20      PT SHORT TERM GOAL #3   Title Pt to demonstrate 5xSTS hands free from single airex pad in <20sec s LOB.    Baseline unable to rise from chair without BUE 4/4: 31.75 ft 6/15: 20.6 seconds with SUE support 7/13: 18 seconds with heavy BUE support ; 24 seconds 8/22: 20 seconds one LOB with SUE support    Time 4    Period Weeks    Status Partially Met    Target Date 10/20/20               PT Long Term Goals - 09/22/20 0958       PT LONG TERM GOAL #1   Title Patient will increase FOTO score to equal to or greater than 65% to demonstrate statistically significant improvement in mobility and quality of life.    Baseline 10/20/19: 32% 11/24: 50.7%; 04/06/20 5/11: 45% 6/15: 45% 7/13: 51% 8/22: 67%    Time 12     Period Weeks    Status Achieved      PT LONG TERM GOAL #2   Title Patient will complete five times sit to stand hands free from chair + airex in < 15 seconds for increased coordination and ADL performance    Baseline Unable to rise from airex without use of hands 4/4: 31.75 seconds with no UE support 5/11: 17.23  seconds with BUE support 6/15: 20. 6 seconds with SUE support: 13.9 seconds with BUE support 7/13: 18 seconds with heavy BUE support ; 24 seconds with SUE with two near LOB (decreased) 8/22: 20 seconds with SUE support one near LOB    Time 8    Period Weeks    Status Partially Met    Target Date 11/17/20      PT LONG TERM GOAL #3   Title Patient will walk with SUE support for 10 ft for increased stability and independence with mobility.    Baseline 6/15: able to walk 2-3 ft with heavy SUE support and max cueing 7/13: SUE (LUE) support 3 ft. 8/22: 10 ft with one rest break and one near LOB    Time 8    Period Weeks    Status Partially Met    Target Date 11/17/20      PT LONG TERM GOAL #4   Title Pt to tolerate 6MWT c 4WW, supervision level assessment >10109f to facilitate confidence and safety in limited community distance AMB.    Baseline Not performed yet. 4/4: 673 ft with rollator 5/11: 665 ft with rollator 6/15: 702 ft with rollator 7/13: 545 ft with frequent standing rest breaks 8/22: 610 ft with frequent standing rest breaks    Time 8    Period Weeks    Status Partially Met    Target Date 11/17/20      PT LONG TERM GOAL #5   Title Patient will static stand without UE support for 5 minutes for ADL performance and decreased reliance upon UE's.    Baseline 7/13/ 1 min 36 seconds 8/22: 3 mins 6 seconds    Time 8    Period Weeks    Status Partially Met    Target Date 11/17/20      PT LONG TERM GOAL #6   Title Patient will reduce timed up and go to <11 seconds to reduce fall risk and demonstrate improved transfer/gait ability.    Baseline 9/7: 35 seconds with RW     Time 8    Period Weeks    Status New    Target Date 11/17/20                   Plan - 09/29/20 1036     Clinical Impression Statement Patient pleasant and motivated throughout session. She reported fatigue while on NuStep asking to decrease level to a 2. Ambulation distance was decreased today due to fatigue as well. Pt came to a full stop multiple times during ambulation with a festinating pattern to initiate steps after each stop and during turns. Decreased right foot clearance with cueing to correct, minimal correction noted. Seated interventions performed at end of session due to LE exhaustion. The pt will benefit from further skilled PT services to improve gait pattern, LE strength and balance to increase QOL.    Personal Factors and Comorbidities Time since onset of injury/illness/exacerbation;Age;Comorbidity 3+;Past/Current Experience;Transportation    Comorbidities osteoporosis, anemia, hx cancer, hyperlipidemia    Examination-Activity Limitations Bathing;Bed Mobility;Dressing;Reach Overhead;Stairs;Stand;Toileting;Locomotion Level;Squat;Transfers;Bend;Hygiene/Grooming;Carry;Sit    Examination-Participation Restrictions Community Activity;Driving;Shop;Yard Work;Cleaning;Meal Prep    Stability/Clinical Decision Making Evolving/Moderate complexity    Rehab Potential Good    PT Frequency 2x / week    PT Duration 8 weeks    PT Treatment/Interventions ADLs/Self Care Home Management;Aquatic Therapy;Cryotherapy;Electrical Stimulation;DME Instruction;Gait training;Stair training;Functional mobility training;Therapeutic activities;Therapeutic exercise;Balance training;Neuromuscular re-education;Patient/family education;Manual techniques;Passive range of motion;Dry needling;Spinal Manipulations;Joint Manipulations;Orthotic Fit/Training;Energy conservation    PT Next  Visit Plan Reducing shuffling gait with turns. LLE strengthening. continue POC as previously indicated    PT Home Exercise  Plan not administered this date    Consulted and Agree with Plan of Care Patient             Patient will benefit from skilled therapeutic intervention in order to improve the following deficits and impairments:  Abnormal gait, Decreased balance, Decreased mobility, Difficulty walking, Hypomobility, Decreased strength, Decreased knowledge of use of DME, Decreased coordination, Postural dysfunction, Impaired flexibility, Improper body mechanics, Impaired perceived functional ability, Decreased activity tolerance  Visit Diagnosis: Other abnormalities of gait and mobility  Unsteadiness on feet  Muscle weakness (generalized)  Other lack of coordination  Abnormality of gait and mobility  Neurologic gait disorder     Problem List Patient Active Problem List   Diagnosis Date Noted   Cognitive deficits    Labile blood pressure    Acute blood loss anemia    Slow transit constipation    Vascular headache    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   Hydrocephalus ([place]) 07/03/2017   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

## 2020-10-04 ENCOUNTER — Ambulatory Visit: Payer: PPO

## 2020-10-04 NOTE — Progress Notes (Signed)
[place]  [phone] [phone]  ID: Krista Evans OB: February 09, 1945  MR#: [id number]  WNU#:[id number]  Patient Care Team: Adin Hector, MD as PCP - General (Internal Medicine)  CHIEF COMPLAINT: Anemia, unspecified.  INTERVAL HISTORY: Patient is a 75 year old female who was last evaluated in clinic in August 2018 for stage IIb lung cancer.  She is referred back for further evaluation of declining hemoglobin.  She recently had a stroke and has some residual weakness, but otherwise feels well.  She has no new neurologic complaints.  She denies any recent fevers or illnesses.  She has a good appetite and denies weight loss.  She has no chest pain, shortness of breath, cough, or hemoptysis.  She denies any nausea, vomiting, constipation, or diarrhea.  She has no melena or hematochezia.  She has no urinary complaints.  Patient offers no further specific complaints today.  REVIEW OF SYSTEMS:   Review of Systems  Constitutional: Negative.  Negative for fever, malaise/fatigue and weight loss.  Respiratory: Negative.  Negative for cough, hemoptysis and shortness of breath.   Cardiovascular: Negative.  Negative for chest pain and leg swelling.  Gastrointestinal: Negative.  Negative for abdominal pain, blood in stool and melena.  Genitourinary: Negative.  Negative for hematuria.  Musculoskeletal: Negative.  Negative for back pain.  Skin: Negative.  Negative for rash.  Neurological:  Positive for focal weakness. Negative for dizziness, weakness and headaches.  Psychiatric/Behavioral: Negative.  The patient is not nervous/anxious.    As per HPI. Otherwise, a complete review of systems is negative.  PAST MEDICAL HISTORY: Past Medical History:  Diagnosis Date   Abnormal Q waves on electrocardiogram    Anemia    Aortic atherosclerosis ([place])    "I could possibly have it, my grandmother had it"   Cancer ([place]) 05/2011   Right upper Lung CA with partial  Lobectomy.   Celiac disease    Celiac disease    Dyspnea    with exertion   Hyperlipidemia    Hyperlipidemia    Lung mass    Meningioma ([facility])    Osteoporosis    Personal history of chemotherapy    Personal history of radiation therapy     PAST SURGICAL HISTORY: Past Surgical History:  Procedure Laterality Date   LUNG LOBECTOMY     right lung   VENTRICULOPERITONEAL SHUNT Right 07/03/2017   Procedure: SHUNT INSERTION VENTRICULAR-PERITONEAL;  Surgeon: Newman Pies, MD;  Location: [facility];  Service: Neurosurgery;  Laterality: Right;    FAMILY HISTORY: Family History  Problem Relation Age of Onset   Breast cancer Cousin 8   CAD Sister    Asthma Mother    Heart disease Mother    Hypertension Mother    COPD Father    Diabetes Maternal Grandmother    Diabetes Maternal Grandfather     ADVANCED DIRECTIVES (Y/N):  N  HEALTH MAINTENANCE: Social History   Tobacco Use   Smoking status: Former    Types: Cigarettes    Quit date: 02/28/1986    Years since quitting: 34.6   Smokeless tobacco: Never  Vaping Use   Vaping Use: Never used  Substance Use Topics   Alcohol use: No    Alcohol/week: 0.0 standard drinks   Drug use: No     Colonoscopy:  PAP:  Bone density:  Lipid panel:  Allergies  Allergen Reactions   Barley Grass Other (See Comments)    And rye Doesn't absorb in small intestines   Gluten  Meal Other (See Comments)    Cant absorb in small intestines    Wheat Bran Other (See Comments)    Doesn't absorb in small intestines    Current Outpatient Medications  Medication Sig Dispense Refill   acetaminophen (TYLENOL) 325 MG tablet Take 650 mg by mouth every 4 (four) hours as needed. Patient states she takes as needed.     calcium carbonate (TUMS - DOSED IN MG ELEMENTAL CALCIUM) 500 MG chewable tablet Chew 2 tablets by mouth daily.      Cholecalciferol (VITAMIN D) 2000 units tablet Take 2,000 Units by mouth daily.     COVID-19 mRNA vaccine, Pfizer, 30  MCG/0.3ML injection USE AS DIRECTED .3 mL 0   Multiple Vitamin (MULTI-VITAMINS) TABS Take 1 tablet by mouth daily.      docusate sodium (COLACE) 100 MG capsule Take 1 capsule (100 mg total) by mouth 2 (two) times daily. (Patient not taking: Reported on 10/23/2017) 10 capsule 0   HYDROcodone-acetaminophen (NORCO/VICODIN) 5-325 MG tablet Take 1 tablet by mouth every 6 (six) hours as needed for severe pain. (Patient not taking: Reported on 07/23/2017) 30 tablet 0   No current facility-administered medications for this visit.    OBJECTIVE: Vitals:   10/07/20 1137  BP: (!) 145/65  Pulse: 73  Resp: 18  Temp: (!) 97.2 F (36.2 C)  SpO2: 98%     Body mass index is 17.67 kg/m.    ECOG FS:1 - Symptomatic but completely ambulatory  General: Well-developed, well-nourished, no acute distress.  Sitting in a wheelchair. Eyes: Pink conjunctiva, anicteric sclera. HEENT: Normocephalic, moist mucous membranes. Lungs: No audible wheezing or coughing. Heart: Regular rate and rhythm. Abdomen: Soft, nontender, no obvious distention. Musculoskeletal: No edema, cyanosis, or clubbing. Neuro: Alert, answering all questions appropriately. Cranial nerves grossly intact. Skin: No rashes or petechiae noted. Psych: Normal affect. Lymphatics: No cervical, calvicular, axillary or inguinal LAD.   LAB RESULTS:  Lab Results  Component Value Date   NA 136 07/08/2017   K 4.1 07/08/2017   CL 102 07/08/2017   CO2 26 07/08/2017   GLUCOSE 97 07/08/2017   BUN 22 (H) 07/08/2017   CREATININE 0.70 09/10/2020   CALCIUM 9.1 07/08/2017   PROT 7.5 07/08/2017   ALBUMIN 3.5 07/08/2017   AST 27 07/08/2017   ALT 21 07/08/2017   ALKPHOS 64 07/08/2017   BILITOT 0.6 07/08/2017   GFRNONAA >60 07/08/2017   GFRAA >60 07/08/2017    Lab Results  Component Value Date   WBC 6.4 10/07/2020   NEUTROABS 4.2 07/08/2017   HGB 10.7 (L) 10/07/2020   HCT 32.5 (L) 10/07/2020   MCV 94.5 10/07/2020   PLT 263 10/07/2020   Lab  Results  Component Value Date   IRON 48 10/07/2020   TIBC 300 10/07/2020   IRONPCTSAT 16 10/07/2020   Lab Results  Component Value Date   FERRITIN 91 10/07/2020     STUDIES: CT CHEST ABDOMEN PELVIS W CONTRAST  Result Date: 09/12/2020 CLINICAL DATA:  Weight loss. History of right lung cancer, status post lumpectomy and radiation. EXAM: CT CHEST, ABDOMEN, AND PELVIS WITH CONTRAST TECHNIQUE: Multidetector CT imaging of the chest, abdomen and pelvis was performed following the standard protocol during bolus administration of intravenous contrast. CONTRAST:  35m OMNIPAQUE IOHEXOL 350 MG/ML SOLN COMPARISON:  CTA chest dated 04/20/2016.  PET-CT dated 06/13/2011. FINDINGS: CT CHEST FINDINGS Cardiovascular: Ectasia of the ascending thoracic aorta, measuring 3.8 cm. Atherosclerotic calcifications of the thoracic aorta with eccentric mural thrombus along the proximal  descending thoracic aorta. The heart is normal in size.  No pericardial effusion. Coronary atherosclerosis of the LAD and right coronary artery. Mediastinum/Nodes: No suspicious mediastinal lymphadenopathy. Visualized thyroid is unremarkable. Lungs/Pleura: Mild right apical pleural-parenchymal scarring. Status post right lower lobectomy with postprocedural changes/scarring in the posterior right hemithorax. Moderate centrilobular and paraseptal emphysematous changes, upper lung predominant. No focal consolidation. No suspicious pulmonary nodules. No pleural effusion or pneumothorax. Musculoskeletal: Visualized osseous structures are within normal limits. CT ABDOMEN PELVIS FINDINGS Hepatobiliary: Liver is within normal limits. Gallbladder is unremarkable. No intrahepatic or extrahepatic ductal dilatation. Pancreas: Within normal limits. Spleen: Within normal limits. Adrenals/Urinary Tract: Adrenal glands are within normal limits. Subcentimeter bilateral renal cysts, measuring up to 8 mm in the right lower kidney (series 7/image 23). No  hydronephrosis. Bladder is within normal limits. Stomach/Bowel: Stomach is within normal limits. No evidence of bowel obstruction. Normal appendix (series 2/image 39). No colonic wall thickening or inflammatory changes. Vascular/Lymphatic: 3.3 x 3.2 cm infrarenal abdominal aortic aneurysm (series 2/image 61). Atherosclerotic calcifications of the abdominal aorta and branch vessels. No suspicious abdominopelvic lymphadenopathy. Reproductive: Status post hysterectomy. Bilateral ovaries are within normal limits. Other: VP shunt catheter terminates in the left pelvis. Small volume pelvic ascites. Musculoskeletal: Mild degenerative changes at L4-5. Old bilateral pelvic ring fractures. IMPRESSION: Status post right upper lobectomy. No evidence of recurrent or metastatic disease. 3.3 cm infrarenal abdominal aortic aneurysm. Recommend follow-up ultrasound every 3 years. This recommendation follows ACR consensus guidelines: White Paper of the ACR Incidental Findings Committee II on Vascular Findings. J Am Coll Radiol 2013; 10:789-794. Additional ancillary findings as above. Aortic Atherosclerosis (ICD10-I70.0) and Emphysema (ICD10-J43.9). Electronically Signed   By: Julian Hy M.D.   On: 09/12/2020 03:32    ASSESSMENT: Anemia, unspecified.  PLAN:    Anemia, unspecified: Hemoglobin remains decreased, but stable at 10.7.  Iron stores, B12, folate, and hemolysis labs are all either negative or within normal limits.  No intervention is needed at this time.  Patient may have a component of underlying MDS, but this would take a bone marrow biopsy to diagnose which is not necessary at this point.  No intervention is needed.  Return to clinic in 4 weeks with repeat laboratory work and further evaluation. History lung cancer: Patient underwent lobectomy greater than 10 years ago.  CT scan from September 12, 2020 reviewed independently and reported as above with no obvious evidence of recurrent or progressive disease.  I  spent a total of 45 minutes reviewing chart data, face-to-face evaluation with the patient, counseling and coordination of care as detailed above.   Patient expressed understanding and was in agreement with this plan. She also understands that She can call clinic at any time with any questions, concerns, or complaints.   Cancer Staging Cancer of upper lobe of right lung [facility]) Staging form: Lung, AJCC 7th Edition - Clinical stage from 08/16/2015: Stage IIB (T3, N0, M0) - Signed by Lloyd Huger, MD on 08/16/2015 Laterality: Right   Lloyd Huger, MD   10/09/2020 8:07 AM

## 2020-10-06 ENCOUNTER — Ambulatory Visit: Payer: PPO

## 2020-10-06 DIAGNOSIS — R2689 Other abnormalities of gait and mobility: Secondary | ICD-10-CM | POA: Diagnosis not present

## 2020-10-06 DIAGNOSIS — R2681 Unsteadiness on feet: Secondary | ICD-10-CM

## 2020-10-06 DIAGNOSIS — M6281 Muscle weakness (generalized): Secondary | ICD-10-CM

## 2020-10-06 NOTE — Therapy (Signed)
[place] MAIN [facility] SERVICES [address] [place], [place], [address] Phone: [phone]   Fax:  [phone]  Physical Therapy Treatment  Patient Details  Name: Krista Evans MRN: [id number] Date of Birth: [birth year] Referring Provider (PT): [facility]   Encounter Date: 10/06/2020   PT End of Session - 10/06/20 0939     Visit Number 84    Number of Visits 17    Date for PT Re-Evaluation 11/17/20    Authorization Type 6/10 PN 8/22    Authorization Time Period 02/10/20-04/06/20; 04/06/20-06/29/20    PT Start Time 0930    PT Stop Time 1014    PT Time Calculation (min) 44 min    Equipment Utilized During Treatment Gait belt    Activity Tolerance Patient tolerated treatment well;No increased pain    Behavior During Therapy WFL for tasks assessed/performed             Past Medical History:  Diagnosis Date   Abnormal Q waves on electrocardiogram    Anemia    Aortic atherosclerosis ([facility])    "I could possibly have it, my grandmother had it"   Cancer ([place]) 05/2011   Right upper Lung CA with partial Lobectomy.   Celiac disease    Celiac disease    Dyspnea    with exertion   Hyperlipidemia    Hyperlipidemia    Lung mass    Meningioma ([facility])    Osteoporosis    Personal history of chemotherapy    Personal history of radiation therapy     Past Surgical History:  Procedure Laterality Date   LUNG LOBECTOMY     right lung   VENTRICULOPERITONEAL SHUNT Right 07/03/2017   Procedure: SHUNT INSERTION VENTRICULAR-PERITONEAL;  Surgeon: Newman Pies, MD;  Location: [facility];  Service: Neurosurgery;  Laterality: Right;    There were no vitals filed for this visit.   Subjective Assessment - 10/06/20 0938     Subjective Patient presents with increased shortness of breath.   Patient reports feeling congested. No falls or LOB since last session.    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 Patient has attended OP PT in the past year for LLE strengthening and balance with positive results, but has not been in attendance recently due to COVID-19 pandemic. PMH includes osteoporosis, anemia, celiac disease, hyperlipidemia, hx of R upper lobe mass with resection 6/13, hx of pneumonia, meningioma, major atherosclerosis. VP shunt has been placed 06/2017.    Limitations Lifting;House hold activities;Standing;Walking    How long can you sit comfortably? n/a    How long can you stand comfortably? ~10 minutes (AM better than PM)    How long can you walk comfortably? a few mins until standing rest break with LLE off ground    Currently in Pain? No/denies               Patient presents with increased shortness of breath.   Sp02 monitored with oxygen dropping to 91%.   Therex:    Nustep Lvl 1-3 seat position 9, L arm 11, R arm 10, 4 minutes for cardiovascular challenge. Very challenging for patient to perform.        Seated:   RTB abduction 15x each LE   RTB alternating marches 12x each LE, cue for not holding onto chair.    Neuro Re-ed:  forward/backwards ambulation in // bars with cues for decreasing to SUE support 4x  length of //bars, backwards very challenging for patient : sp02 drop to 92% Static stand and throw ball at target x 15 attempts for pertubation's inside/outside BOS  Static stand raising rainbow ball 10x, chest press 10x  Step over half foam roller and back with BUE support, 12x each LE, reports more challenging on LLE.   Lateral step over half foam roller and back 10x each LE      Patient's vitals monitored throughout the session.  Pt educated throughout session about proper posture and technique with exercises. Improved exercise technique, movement at target joints, use of target muscles after min to mod verbal, visual, tactile cues    Patient's session primarily focused on stability with patient tolerating interventions well despite  fear of LOB. Patient is challenged with throwing of ball with frequent inability to release ball initially requiring extensive cueing. LLE fatigued quicker than RLE requiring occasional rest breaks. Occasional rest breaks required due to Sp02 dropping to 91% throughout session, able to return quickly to >98% with seated rest. The pt will benefit from further skilled PT services to improve gait pattern, LE strength and balance to increase QOL.                      PT Education - 10/06/20 0939     Education provided Yes    Education Details exercise technique, body mechanics    Person(s) Educated Patient    Methods Explanation;Demonstration;Tactile cues;Verbal cues    Comprehension Verbalized understanding;Returned demonstration;Verbal cues required;Tactile cues required              PT Short Term Goals - 09/22/20 0957       PT SHORT TERM GOAL #1   Title Pt to demonstrate improved hip extension strength AEB ability to perform 5 full height hooklying bridges.    Baseline unable to rise from chair hands free due to hip extension weakness 6/15: reqiures SUE support for STS 7/13: didnt perform due to back pain    Time 4    Period Weeks    Status Deferred      PT SHORT TERM GOAL #2   Title Pt to demonstrate SLS balance >3 sec hands free to facilitate weight shift in gait and single limb gross motor coordination.    Baseline Requires modA for weightshift, unable to remain in SLS without BUE 4/4: unable to perform 5/11: 1-2 seconds 7/13: unable to perform 8/22 :unable to perform 9/7: 2 seconds RLE unable to perform with LLE    Time 4    Period Weeks    Status Partially Met    Target Date 10/20/20      PT SHORT TERM GOAL #3   Title Pt to demonstrate 5xSTS hands free from single airex pad in <20sec s LOB.    Baseline unable to rise from chair without BUE 4/4: 31.75 ft 6/15: 20.6 seconds with SUE support 7/13: 18 seconds with heavy BUE support ; 24 seconds 8/22: 20 seconds  one LOB with SUE support    Time 4    Period Weeks    Status Partially Met    Target Date 10/20/20               PT Long Term Goals - 09/22/20 0958       PT LONG TERM GOAL #1   Title Patient will increase FOTO score to equal to or greater than 65% to demonstrate statistically significant improvement in mobility and quality of life.  Baseline 10/20/19: 32% 11/24: 50.7%; 04/06/20 5/11: 45% 6/15: 45% 7/13: 51% 8/22: 67%    Time 12    Period Weeks    Status Achieved      PT LONG TERM GOAL #2   Title Patient will complete five times sit to stand hands free from chair + airex in < 15 seconds for increased coordination and ADL performance    Baseline Unable to rise from airex without use of hands 4/4: 31.75 seconds with no UE support 5/11: 17.23 seconds with BUE support 6/15: 20. 6 seconds with SUE support: 13.9 seconds with BUE support 7/13: 18 seconds with heavy BUE support ; 24 seconds with SUE with two near LOB (decreased) 8/22: 20 seconds with SUE support one near LOB    Time 8    Period Weeks    Status Partially Met    Target Date 11/17/20      PT LONG TERM GOAL #3   Title Patient will walk with SUE support for 10 ft for increased stability and independence with mobility.    Baseline 6/15: able to walk 2-3 ft with heavy SUE support and max cueing 7/13: SUE (LUE) support 3 ft. 8/22: 10 ft with one rest break and one near LOB    Time 8    Period Weeks    Status Partially Met    Target Date 11/17/20      PT LONG TERM GOAL #4   Title Pt to tolerate 6MWT c 4WW, supervision level assessment >1036f to facilitate confidence and safety in limited community distance AMB.    Baseline Not performed yet. 4/4: 673 ft with rollator 5/11: 665 ft with rollator 6/15: 702 ft with rollator 7/13: 545 ft with frequent standing rest breaks 8/22: 610 ft with frequent standing rest breaks    Time 8    Period Weeks    Status Partially Met    Target Date 11/17/20      PT LONG TERM GOAL #5    Title Patient will static stand without UE support for 5 minutes for ADL performance and decreased reliance upon UE's.    Baseline 7/13/ 1 min 36 seconds 8/22: 3 mins 6 seconds    Time 8    Period Weeks    Status Partially Met    Target Date 11/17/20      PT LONG TERM GOAL #6   Title Patient will reduce timed up and go to <11 seconds to reduce fall risk and demonstrate improved transfer/gait ability.    Baseline 9/7: 35 seconds with RW    Time 8    Period Weeks    Status New    Target Date 11/17/20                   Plan - 10/06/20 1012     Clinical Impression Statement Patient's session primarily focused on stability with patient tolerating interventions well despite fear of LOB. Patient is challenged with throwing of ball with frequent inability to release ball initially requiring extensive cueing. LLE fatigued quicker than RLE requiring occasional rest breaks. Occasional rest breaks required due to Sp02 dropping to 91% throughout session, able to return quickly to >98% with seated rest. The pt will benefit from further skilled PT services to improve gait pattern, LE strength and balance to increase QOL.    Personal Factors and Comorbidities Time since onset of injury/illness/exacerbation;Age;Comorbidity 3+;Past/Current Experience;Transportation    Comorbidities osteoporosis, anemia, hx cancer, hyperlipidemia    Examination-Activity Limitations Bathing;Bed  Mobility;Dressing;Reach Overhead;Stairs;Stand;Toileting;Locomotion Level;Squat;Transfers;Bend;Hygiene/Grooming;Carry;Sit    Examination-Participation Restrictions Community Activity;Driving;Shop;Yard Work;Cleaning;Meal Prep    Stability/Clinical Decision Making Evolving/Moderate complexity    Rehab Potential Good    PT Frequency 2x / week    PT Duration 8 weeks    PT Treatment/Interventions ADLs/Self Care Home Management;Aquatic Therapy;Cryotherapy;Electrical Stimulation;DME Instruction;Gait training;Stair training;Functional  mobility training;Therapeutic activities;Therapeutic exercise;Balance training;Neuromuscular re-education;Patient/family education;Manual techniques;Passive range of motion;Dry needling;Spinal Manipulations;Joint Manipulations;Orthotic Fit/Training;Energy conservation    PT Next Visit Plan Reducing shuffling gait with turns. LLE strengthening. continue POC as previously indicated    PT Home Exercise Plan not administered this date    Consulted and Agree with Plan of Care Patient             Patient will benefit from skilled therapeutic intervention in order to improve the following deficits and impairments:  Abnormal gait, Decreased balance, Decreased mobility, Difficulty walking, Hypomobility, Decreased strength, Decreased knowledge of use of DME, Decreased coordination, Postural dysfunction, Impaired flexibility, Improper body mechanics, Impaired perceived functional ability, Decreased activity tolerance  Visit Diagnosis: Other abnormalities of gait and mobility  Unsteadiness on feet  Muscle weakness (generalized)     Problem List Patient Active Problem List   Diagnosis Date Noted   Cognitive deficits    Labile blood pressure    Acute blood loss anemia    Slow transit constipation    Vascular headache    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

## 2020-10-07 ENCOUNTER — Inpatient Hospital Stay: Payer: PPO | Attending: Oncology | Admitting: Oncology

## 2020-10-07 ENCOUNTER — Inpatient Hospital Stay: Payer: PPO

## 2020-10-07 VITALS — BP 145/65 | HR 73 | Temp 97.2°F | Resp 18 | Wt 106.2 lb

## 2020-10-07 DIAGNOSIS — Z87891 Personal history of nicotine dependence: Secondary | ICD-10-CM | POA: Diagnosis not present

## 2020-10-07 DIAGNOSIS — Z803 Family history of malignant neoplasm of breast: Secondary | ICD-10-CM

## 2020-10-07 DIAGNOSIS — D649 Anemia, unspecified: Secondary | ICD-10-CM

## 2020-10-07 DIAGNOSIS — Z85118 Personal history of other malignant neoplasm of bronchus and lung: Secondary | ICD-10-CM

## 2020-10-07 LAB — CBC
HCT: 32.5 % — ABNORMAL LOW (ref 36.0–46.0)
Hemoglobin: 10.7 g/dL — ABNORMAL LOW (ref 12.0–15.0)
MCH: 31.1 pg (ref 26.0–34.0)
MCHC: 32.9 g/dL (ref 30.0–36.0)
MCV: 94.5 fL (ref 80.0–100.0)
Platelets: 263 10*3/uL (ref 150–400)
RBC: 3.44 MIL/uL — ABNORMAL LOW (ref 3.87–5.11)
RDW: 13.6 % (ref 11.5–15.5)
WBC: 6.4 10*3/uL (ref 4.0–10.5)
nRBC: 0 % (ref 0.0–0.2)

## 2020-10-07 LAB — IRON AND TIBC
Iron: 48 ug/dL (ref 28–170)
Saturation Ratios: 16 % (ref 10.4–31.8)
TIBC: 300 ug/dL (ref 250–450)
UIBC: 252 ug/dL

## 2020-10-07 LAB — FERRITIN: Ferritin: 91 ng/mL (ref 11–307)

## 2020-10-07 LAB — LACTATE DEHYDROGENASE: LDH: 132 U/L (ref 98–192)

## 2020-10-07 LAB — FOLATE: Folate: 35 ng/mL (ref >5.9)

## 2020-10-07 LAB — DAT, POLYSPECIFIC AHG (ARMC ONLY): Polyspecific AHG test: NEGATIVE

## 2020-10-07 LAB — VITAMIN B12: Vitamin B-12: 518 pg/mL (ref 180–914)

## 2020-10-07 NOTE — Progress Notes (Signed)
Pt reports being more short of breath and tired than ususal. Denies any blood in stools. No other concerns at this time.

## 2020-10-08 LAB — HAPTOGLOBIN: Haptoglobin: 274 mg/dL (ref 42–346)

## 2020-10-11 ENCOUNTER — Ambulatory Visit: Payer: PPO

## 2020-10-13 ENCOUNTER — Other Ambulatory Visit: Payer: Self-pay | Admitting: Student

## 2020-10-13 ENCOUNTER — Ambulatory Visit: Payer: PPO

## 2020-10-13 DIAGNOSIS — G910 Communicating hydrocephalus: Secondary | ICD-10-CM

## 2020-10-18 ENCOUNTER — Ambulatory Visit: Payer: PPO

## 2020-10-20 ENCOUNTER — Ambulatory Visit: Payer: PPO | Attending: Internal Medicine

## 2020-10-20 DIAGNOSIS — R269 Unspecified abnormalities of gait and mobility: Secondary | ICD-10-CM | POA: Insufficient documentation

## 2020-10-20 DIAGNOSIS — M6281 Muscle weakness (generalized): Secondary | ICD-10-CM | POA: Insufficient documentation

## 2020-10-20 DIAGNOSIS — R2689 Other abnormalities of gait and mobility: Secondary | ICD-10-CM | POA: Insufficient documentation

## 2020-10-20 DIAGNOSIS — R2681 Unsteadiness on feet: Secondary | ICD-10-CM | POA: Insufficient documentation

## 2020-10-20 DIAGNOSIS — R278 Other lack of coordination: Secondary | ICD-10-CM | POA: Insufficient documentation

## 2020-10-25 ENCOUNTER — Ambulatory Visit: Payer: PPO

## 2020-10-25 DIAGNOSIS — Z23 Encounter for immunization: Secondary | ICD-10-CM | POA: Diagnosis not present

## 2020-10-26 ENCOUNTER — Other Ambulatory Visit: Payer: Self-pay

## 2020-10-26 ENCOUNTER — Ambulatory Visit
Admission: RE | Admit: 2020-10-26 | Discharge: 2020-10-26 | Disposition: A | Discharge status: Home / Self Care | Payer: PPO | Source: Ambulatory Visit | Attending: Student | Admitting: Student

## 2020-10-26 DIAGNOSIS — G910 Communicating hydrocephalus: Secondary | ICD-10-CM | POA: Insufficient documentation

## 2020-10-26 DIAGNOSIS — R531 Weakness: Secondary | ICD-10-CM | POA: Diagnosis not present

## 2020-10-26 DIAGNOSIS — R030 Elevated blood-pressure reading, without diagnosis of hypertension: Secondary | ICD-10-CM | POA: Diagnosis not present

## 2020-10-26 MED ORDER — GADOBUTROL 1 MMOL/ML IV SOLN
5.0000 mL | Freq: Once | INTRAVENOUS | Status: AC | PRN
  Administered 2020-10-26: 5 mL via INTRAVENOUS

## 2020-10-27 ENCOUNTER — Other Ambulatory Visit (HOSPITAL_COMMUNITY): Payer: Self-pay | Admitting: Student

## 2020-10-27 ENCOUNTER — Ambulatory Visit: Payer: PPO

## 2020-10-27 ENCOUNTER — Other Ambulatory Visit: Payer: Self-pay | Admitting: Student

## 2020-10-27 DIAGNOSIS — G910 Communicating hydrocephalus: Secondary | ICD-10-CM

## 2020-11-01 ENCOUNTER — Ambulatory Visit: Payer: PPO

## 2020-11-01 NOTE — Progress Notes (Signed)
[place]  [phone] [phone]  ID: Krista Evans OB: Oct 11, 1945  MR#: [id number]  HOZ#:[id number]  Patient Care Team: Adin Hector, MD as PCP - General (Internal Medicine)  CHIEF COMPLAINT: Anemia, unspecified.  INTERVAL HISTORY: Patient returns to clinic today for repeat laboratory work and further evaluation.  She currently feels well and is at her baseline. She recently had a stroke and has some residual weakness, but otherwise feels well.  She has no new neurologic complaints.  She denies any recent fevers or illnesses.  She has a good appetite and denies weight loss.  She has no chest pain, shortness of breath, cough, or hemoptysis.  She denies any nausea, vomiting, constipation, or diarrhea.  She has no melena or hematochezia.  She has no urinary complaints.  Patient offers no further specific complaints today.  REVIEW OF SYSTEMS:   Review of Systems  Constitutional: Negative.  Negative for fever, malaise/fatigue and weight loss.  Respiratory: Negative.  Negative for cough, hemoptysis and shortness of breath.   Cardiovascular: Negative.  Negative for chest pain and leg swelling.  Gastrointestinal: Negative.  Negative for abdominal pain, blood in stool and melena.  Genitourinary: Negative.  Negative for hematuria.  Musculoskeletal: Negative.  Negative for back pain.  Skin: Negative.  Negative for rash.  Neurological:  Positive for focal weakness. Negative for dizziness, weakness and headaches.  Psychiatric/Behavioral: Negative.  The patient is not nervous/anxious.    As per HPI. Otherwise, a complete review of systems is negative.  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   Procedure: SHUNT INSERTION VENTRICULAR-PERITONEAL;  Surgeon: Newman Pies, MD;  Location: [place];  Service: Neurosurgery;  Laterality: Right;    FAMILY HISTORY: Family History  Problem Relation Age of Onset   Breast cancer Cousin 71   CAD Sister    Asthma Mother    Heart disease Mother    Hypertension Mother    COPD Father    Diabetes Maternal Grandmother    Diabetes Maternal Grandfather     ADVANCED DIRECTIVES (Y/N):  N  HEALTH MAINTENANCE: Social History   Tobacco Use   Smoking status: Former    Types: Cigarettes    Quit date: 02/28/1986    Years since quitting: 34.7   Smokeless tobacco: Never  Vaping Use   Vaping Use: Never used  Substance Use Topics   Alcohol use: No    Alcohol/week: 0.0 standard drinks   Drug use: No     Colonoscopy:  PAP:  Bone density:  Lipid panel:  Allergies  Allergen Reactions   Barley Grass Other (See Comments)    And rye Doesn't absorb in small intestines   Gluten Meal Other (See Comments)    Cant absorb  in small intestines    Wheat Bran Other (See Comments)    Doesn't absorb in small intestines    Current Outpatient Medications  Medication Sig Dispense Refill   acetaminophen (TYLENOL) 325 MG tablet Take 650 mg by mouth every 4 (four) hours as needed. Patient states she takes as needed.     calcium carbonate (TUMS - DOSED IN MG ELEMENTAL CALCIUM) 500 MG chewable tablet Chew 2 tablets by mouth daily.      Cholecalciferol (VITAMIN D) 2000 units tablet Take 2,000 Units by mouth daily.     COVID-19 mRNA vaccine, Pfizer, 30 MCG/0.3ML injection USE AS DIRECTED .3 mL 0   Multiple  Vitamin (MULTI-VITAMINS) TABS Take 1 tablet by mouth daily.      docusate sodium (COLACE) 100 MG capsule Take 1 capsule (100 mg total) by mouth 2 (two) times daily. (Patient not taking: No sig reported) 10 capsule 0   HYDROcodone-acetaminophen (NORCO/VICODIN) 5-325 MG tablet Take 1 tablet by mouth every 6 (six) hours as needed for severe pain. (Patient not taking: No sig reported) 30 tablet 0   No current facility-administered medications for this visit.    OBJECTIVE: Vitals:   11/04/20 1049  BP: (!) 151/64  Pulse: 79  Resp: 16  Temp: 98.3 F (36.8 C)  SpO2: 97%     Body mass index is 18.09 kg/m.    ECOG FS:1 - Symptomatic but completely ambulatory  General: Well-developed, well-nourished, no acute distress.  Sitting in a wheelchair. Eyes: Pink conjunctiva, anicteric sclera. HEENT: Normocephalic, moist mucous membranes. Lungs: No audible wheezing or coughing. Heart: Regular rate and rhythm. Abdomen: Soft, nontender, no obvious distention. Musculoskeletal: No edema, cyanosis, or clubbing. Neuro: Alert, answering all questions appropriately. Cranial nerves grossly intact. Skin: No rashes or petechiae noted. Psych: Normal affect.    LAB RESULTS:  Lab Results  Component Value Date   NA 136 07/08/2017   K 4.1 07/08/2017   CL 102 07/08/2017   CO2 26 07/08/2017   GLUCOSE 97 07/08/2017   BUN 22 (H) 07/08/2017   CREATININE 0.70 09/10/2020   CALCIUM 9.1 07/08/2017   PROT 7.5 07/08/2017   ALBUMIN 3.5 07/08/2017   AST 27 07/08/2017   ALT 21 07/08/2017   ALKPHOS 64 07/08/2017   BILITOT 0.6 07/08/2017   GFRNONAA >60 07/08/2017   GFRAA >60 07/08/2017    Lab Results  Component Value Date   WBC 5.8 11/04/2020   NEUTROABS 3.9 11/04/2020   HGB 11.0 (L) 11/04/2020   HCT 33.4 (L) 11/04/2020   MCV 94.9 11/04/2020   PLT 251 11/04/2020   Lab Results  Component Value Date   IRON 48 10/07/2020   TIBC 300 10/07/2020   IRONPCTSAT 16 10/07/2020   Lab Results  Component Value  Date   FERRITIN 91 10/07/2020     STUDIES: MR BRAIN W WO CONTRAST  Result Date: 10/26/2020 CLINICAL DATA:  Left arm and leg weakness, several falls, history of shunt placed in 2019 EXAM: MRI HEAD WITHOUT AND WITH CONTRAST TECHNIQUE: Multiplanar, multiecho pulse sequences of the brain and surrounding structures were obtained without and with intravenous contrast. CONTRAST:  59m GADAVIST GADOBUTROL 1 MMOL/ML IV SOLN COMPARISON:  CT head 10/28/2019 FINDINGS: Brain: There is a right parietal approach ventricular catheter terminating in the body of the left lateral ventricle, similar to the prior CT. The ventricles are similar in size compared to the prior CT, with no new hydrocephalus. There are new subdural collections overlying the bilateral cerebral hemispheres measuring up to 2-3  mm. There is no significant mass effect on the underlying brain parenchyma. The collections demonstrate postcontrast enhancement. There is no evidence of acute intracranial hemorrhage or infarct. 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 A small right frontal convexity meningioma seen within the subdural collection is stable in size (18-130). There is no new mass lesion. There is no midline shift. The pituitary is normal in size. Vascular: Normal flow voids. Skull and upper cervical spine: Normal marrow signal. Sinuses/Orbits: The paranasal sinuses are clear. The globes and orbits are unremarkable. Other: None. IMPRESSION: 1. Stable position of the right parietal ventricular catheter. 2. Stable size and configuration of the ventricular system. 3. New subdural collections overlying the bilateral cerebral convexities measuring up to approximately 3 mm. This can be seen in the setting of over shunting. Infectious pachymeningitis or metastatic pachymeningeal disease are considered unlikely. These  results were called by telephone at the time of interpretation on 10/26/2020 at 11:44 am to provider Dr Newman Pies, who verbally acknowledged these results. Electronically Signed   By: Valetta Mole M.D.   On: 10/26/2020 11:46    ASSESSMENT: Anemia, unspecified.  PLAN:    Anemia, unspecified: Patient's most recent hemoglobin has trended slightly up to 11.0.  Previously, all of her other laboratory work including iron stores, B12, folate, and hemolysis labs are all either negative or within normal limits.  No intervention is needed at this time.  Patient may have a component of underlying MDS, but this would take a bone marrow biopsy to diagnose which is not necessary at this point.  No intervention is needed.  Return to clinic in 6 months with repeat laboratory work and further evaluation.  If her hemoglobin remains stable at that time, patient can likely be discharged from clinic.   History lung cancer: Patient underwent lobectomy greater than 10 years ago.  CT scan from September 12, 2020 reviewed independently and reported as above with no obvious evidence of recurrent or progressive disease.  I spent a total of 20 minutes reviewing chart data, face-to-face evaluation with the patient, counseling and coordination of care as detailed above.   Patient expressed understanding and was in agreement with this plan. She also understands that She can call clinic at any time with any questions, concerns, or complaints.   Cancer Staging Cancer of upper lobe of right lung [facility]) Staging form: Lung, AJCC 7th Edition - Clinical stage from 08/16/2015: Stage IIB (T3, N0, M0) - Signed by Lloyd Huger, MD on 08/16/2015 Laterality: Right   Lloyd Huger, MD   11/04/2020 1:18 PM

## 2020-11-03 ENCOUNTER — Ambulatory Visit: Payer: PPO

## 2020-11-04 ENCOUNTER — Telehealth: Payer: Self-pay | Admitting: *Deleted

## 2020-11-04 ENCOUNTER — Inpatient Hospital Stay: Payer: PPO | Attending: Oncology | Admitting: Oncology

## 2020-11-04 ENCOUNTER — Inpatient Hospital Stay: Payer: PPO

## 2020-11-04 ENCOUNTER — Other Ambulatory Visit: Payer: Self-pay

## 2020-11-04 VITALS — BP 151/64 | HR 79 | Temp 98.3°F | Resp 16 | Wt 108.7 lb

## 2020-11-04 DIAGNOSIS — D649 Anemia, unspecified: Secondary | ICD-10-CM | POA: Insufficient documentation

## 2020-11-04 DIAGNOSIS — Z85118 Personal history of other malignant neoplasm of bronchus and lung: Secondary | ICD-10-CM | POA: Insufficient documentation

## 2020-11-04 DIAGNOSIS — Z8673 Personal history of transient ischemic attack (TIA), and cerebral infarction without residual deficits: Secondary | ICD-10-CM | POA: Insufficient documentation

## 2020-11-04 DIAGNOSIS — Z923 Personal history of irradiation: Secondary | ICD-10-CM | POA: Insufficient documentation

## 2020-11-04 LAB — CBC WITH DIFFERENTIAL/PLATELET
Abs Immature Granulocytes: 0.02 10*3/uL (ref 0.00–0.07)
Basophils Absolute: 0.1 10*3/uL (ref 0.0–0.1)
Basophils Relative: 1 %
Eosinophils Absolute: 0.1 10*3/uL (ref 0.0–0.5)
Eosinophils Relative: 1 %
HCT: 33.4 % — ABNORMAL LOW (ref 36.0–46.0)
Hemoglobin: 11 g/dL — ABNORMAL LOW (ref 12.0–15.0)
Immature Granulocytes: 0 %
Lymphocytes Relative: 20 %
Lymphs Abs: 1.1 10*3/uL (ref 0.7–4.0)
MCH: 31.3 pg (ref 26.0–34.0)
MCHC: 32.9 g/dL (ref 30.0–36.0)
MCV: 94.9 fL (ref 80.0–100.0)
Monocytes Absolute: 0.6 10*3/uL (ref 0.1–1.0)
Monocytes Relative: 10 %
Neutro Abs: 3.9 10*3/uL (ref 1.7–7.7)
Neutrophils Relative %: 68 %
Platelets: 251 10*3/uL (ref 150–400)
RBC: 3.52 MIL/uL — ABNORMAL LOW (ref 3.87–5.11)
RDW: 13.6 % (ref 11.5–15.5)
WBC: 5.8 10*3/uL (ref 4.0–10.5)
nRBC: 0 % (ref 0.0–0.2)

## 2020-11-04 NOTE — Telephone Encounter (Signed)
Patient called stating she forgot to get a copy of her lab results while in office this morning and is asking that you mail her a copy please

## 2020-11-04 NOTE — Progress Notes (Signed)
Pt has no concerns at this time.

## 2020-11-04 NOTE — Telephone Encounter (Signed)
Lab results printed and taken to mailbox to be sent to patient per request.

## 2020-11-08 ENCOUNTER — Ambulatory Visit: Payer: PPO

## 2020-11-09 DIAGNOSIS — H2513 Age-related nuclear cataract, bilateral: Secondary | ICD-10-CM | POA: Diagnosis not present

## 2020-11-09 DIAGNOSIS — H5213 Myopia, bilateral: Secondary | ICD-10-CM | POA: Diagnosis not present

## 2020-11-09 DIAGNOSIS — H25043 Posterior subcapsular polar age-related cataract, bilateral: Secondary | ICD-10-CM | POA: Diagnosis not present

## 2020-11-09 DIAGNOSIS — H2589 Other age-related cataract: Secondary | ICD-10-CM | POA: Diagnosis not present

## 2020-11-10 ENCOUNTER — Ambulatory Visit
Admission: RE | Admit: 2020-11-10 | Discharge: 2020-11-10 | Disposition: A | Discharge status: Home / Self Care | Payer: PPO | Source: Ambulatory Visit | Attending: Student | Admitting: Student

## 2020-11-10 ENCOUNTER — Ambulatory Visit: Payer: PPO

## 2020-11-10 DIAGNOSIS — G910 Communicating hydrocephalus: Secondary | ICD-10-CM | POA: Diagnosis not present

## 2020-11-15 ENCOUNTER — Ambulatory Visit: Payer: PPO

## 2020-11-17 ENCOUNTER — Ambulatory Visit: Payer: PPO

## 2020-11-22 ENCOUNTER — Ambulatory Visit: Payer: PPO

## 2020-11-24 ENCOUNTER — Ambulatory Visit: Payer: PPO

## 2020-11-29 ENCOUNTER — Ambulatory Visit: Payer: PPO

## 2020-12-01 ENCOUNTER — Ambulatory Visit: Payer: PPO

## 2020-12-06 ENCOUNTER — Ambulatory Visit: Payer: PPO

## 2020-12-08 ENCOUNTER — Ambulatory Visit: Payer: PPO

## 2020-12-13 ENCOUNTER — Ambulatory Visit: Payer: PPO

## 2020-12-15 ENCOUNTER — Ambulatory Visit: Payer: PPO

## 2020-12-16 DIAGNOSIS — E7849 Other hyperlipidemia: Secondary | ICD-10-CM | POA: Diagnosis not present

## 2020-12-16 DIAGNOSIS — I700 Atherosclerosis of aorta: Secondary | ICD-10-CM | POA: Diagnosis not present

## 2020-12-16 DIAGNOSIS — I77810 Thoracic aortic ectasia: Secondary | ICD-10-CM | POA: Diagnosis not present

## 2020-12-20 ENCOUNTER — Ambulatory Visit: Payer: PPO

## 2020-12-22 ENCOUNTER — Ambulatory Visit: Payer: PPO

## 2020-12-23 DIAGNOSIS — K900 Celiac disease: Secondary | ICD-10-CM | POA: Diagnosis not present

## 2020-12-23 DIAGNOSIS — Z23 Encounter for immunization: Secondary | ICD-10-CM | POA: Diagnosis not present

## 2020-12-23 DIAGNOSIS — E7849 Other hyperlipidemia: Secondary | ICD-10-CM | POA: Diagnosis not present

## 2020-12-23 DIAGNOSIS — I700 Atherosclerosis of aorta: Secondary | ICD-10-CM | POA: Diagnosis not present

## 2020-12-23 DIAGNOSIS — D329 Benign neoplasm of meninges, unspecified: Secondary | ICD-10-CM | POA: Diagnosis not present

## 2020-12-23 DIAGNOSIS — M810 Age-related osteoporosis without current pathological fracture: Secondary | ICD-10-CM | POA: Diagnosis not present

## 2020-12-23 DIAGNOSIS — E038 Other specified hypothyroidism: Secondary | ICD-10-CM | POA: Diagnosis not present

## 2020-12-23 DIAGNOSIS — E441 Mild protein-calorie malnutrition: Secondary | ICD-10-CM | POA: Diagnosis not present

## 2020-12-23 DIAGNOSIS — I714 Abdominal aortic aneurysm, without rupture, unspecified: Secondary | ICD-10-CM | POA: Diagnosis not present

## 2020-12-23 DIAGNOSIS — G912 (Idiopathic) normal pressure hydrocephalus: Secondary | ICD-10-CM | POA: Diagnosis not present

## 2020-12-23 DIAGNOSIS — Z85118 Personal history of other malignant neoplasm of bronchus and lung: Secondary | ICD-10-CM | POA: Diagnosis not present

## 2020-12-23 DIAGNOSIS — I77810 Thoracic aortic ectasia: Secondary | ICD-10-CM | POA: Diagnosis not present

## 2020-12-27 ENCOUNTER — Ambulatory Visit: Payer: PPO

## 2020-12-29 ENCOUNTER — Ambulatory Visit: Payer: PPO

## 2021-01-03 ENCOUNTER — Ambulatory Visit: Payer: PPO

## 2021-01-05 ENCOUNTER — Ambulatory Visit: Payer: PPO

## 2021-01-12 ENCOUNTER — Ambulatory Visit: Payer: PPO

## 2021-01-19 ENCOUNTER — Ambulatory Visit: Payer: PPO

## 2021-01-24 ENCOUNTER — Ambulatory Visit: Payer: PPO

## 2021-01-24 DIAGNOSIS — E038 Other specified hypothyroidism: Secondary | ICD-10-CM | POA: Diagnosis not present

## 2021-01-26 ENCOUNTER — Ambulatory Visit: Payer: PPO

## 2021-03-16 DIAGNOSIS — E7849 Other hyperlipidemia: Secondary | ICD-10-CM | POA: Diagnosis not present

## 2021-03-16 DIAGNOSIS — E038 Other specified hypothyroidism: Secondary | ICD-10-CM | POA: Diagnosis not present

## 2021-03-16 DIAGNOSIS — E441 Mild protein-calorie malnutrition: Secondary | ICD-10-CM | POA: Diagnosis not present

## 2021-04-18 DIAGNOSIS — I700 Atherosclerosis of aorta: Secondary | ICD-10-CM | POA: Diagnosis not present

## 2021-04-18 DIAGNOSIS — I714 Abdominal aortic aneurysm, without rupture, unspecified: Secondary | ICD-10-CM | POA: Diagnosis not present

## 2021-04-18 DIAGNOSIS — D329 Benign neoplasm of meninges, unspecified: Secondary | ICD-10-CM | POA: Diagnosis not present

## 2021-04-18 DIAGNOSIS — G912 (Idiopathic) normal pressure hydrocephalus: Secondary | ICD-10-CM | POA: Diagnosis not present

## 2021-04-18 DIAGNOSIS — E785 Hyperlipidemia, unspecified: Secondary | ICD-10-CM | POA: Diagnosis not present

## 2021-04-18 DIAGNOSIS — R202 Paresthesia of skin: Secondary | ICD-10-CM | POA: Diagnosis not present

## 2021-04-18 DIAGNOSIS — Z7409 Other reduced mobility: Secondary | ICD-10-CM | POA: Diagnosis not present

## 2021-04-18 DIAGNOSIS — I77810 Thoracic aortic ectasia: Secondary | ICD-10-CM | POA: Diagnosis not present

## 2021-04-18 DIAGNOSIS — E441 Mild protein-calorie malnutrition: Secondary | ICD-10-CM | POA: Diagnosis not present

## 2021-04-19 DIAGNOSIS — E441 Mild protein-calorie malnutrition: Secondary | ICD-10-CM | POA: Diagnosis not present

## 2021-04-19 DIAGNOSIS — R202 Paresthesia of skin: Secondary | ICD-10-CM | POA: Diagnosis not present

## 2021-04-19 DIAGNOSIS — I700 Atherosclerosis of aorta: Secondary | ICD-10-CM | POA: Diagnosis not present

## 2021-04-19 DIAGNOSIS — E785 Hyperlipidemia, unspecified: Secondary | ICD-10-CM | POA: Diagnosis not present

## 2021-04-19 DIAGNOSIS — Z7409 Other reduced mobility: Secondary | ICD-10-CM | POA: Diagnosis not present

## 2021-04-19 DIAGNOSIS — G912 (Idiopathic) normal pressure hydrocephalus: Secondary | ICD-10-CM | POA: Diagnosis not present

## 2021-04-19 DIAGNOSIS — I77810 Thoracic aortic ectasia: Secondary | ICD-10-CM | POA: Diagnosis not present

## 2021-04-19 DIAGNOSIS — D329 Benign neoplasm of meninges, unspecified: Secondary | ICD-10-CM | POA: Diagnosis not present

## 2021-04-19 DIAGNOSIS — I714 Abdominal aortic aneurysm, without rupture, unspecified: Secondary | ICD-10-CM | POA: Diagnosis not present

## 2021-05-02 ENCOUNTER — Telehealth: Payer: Self-pay | Admitting: Oncology

## 2021-05-02 ENCOUNTER — Telehealth: Payer: Self-pay | Admitting: Nurse Practitioner

## 2021-05-02 ENCOUNTER — Telehealth: Payer: Self-pay | Admitting: *Deleted

## 2021-05-02 NOTE — Telephone Encounter (Signed)
Patient called to cancel appointment 05/05/21, Please call her to reschedule it ?

## 2021-05-02 NOTE — Telephone Encounter (Signed)
Per triage, patient would like cancel today's appointment. Attempt made to reach patient in order to get her rescheduled. No answer, VM left.  ?

## 2021-05-02 NOTE — Telephone Encounter (Signed)
LVM for pt to give [place] a call back to resch appt that was cancelled KJ  ?

## 2021-05-05 ENCOUNTER — Ambulatory Visit: Payer: PPO | Admitting: Nurse Practitioner

## 2021-05-05 ENCOUNTER — Other Ambulatory Visit: Payer: PPO

## 2021-05-17 ENCOUNTER — Encounter: Payer: Self-pay | Admitting: *Deleted

## 2021-05-18 ENCOUNTER — Telehealth: Payer: Self-pay | Admitting: Neurology

## 2021-05-18 ENCOUNTER — Ambulatory Visit: Payer: PPO | Admitting: Neurology

## 2021-05-18 NOTE — Telephone Encounter (Signed)
Pt cancelled appt due to not be able to get down the stairs. ?

## 2021-07-06 DIAGNOSIS — G912 (Idiopathic) normal pressure hydrocephalus: Secondary | ICD-10-CM | POA: Diagnosis not present

## 2021-07-18 DIAGNOSIS — E441 Mild protein-calorie malnutrition: Secondary | ICD-10-CM | POA: Diagnosis not present

## 2021-07-18 DIAGNOSIS — M6281 Muscle weakness (generalized): Secondary | ICD-10-CM | POA: Diagnosis not present

## 2021-07-18 DIAGNOSIS — Z923 Personal history of irradiation: Secondary | ICD-10-CM | POA: Diagnosis not present

## 2021-07-18 DIAGNOSIS — R2681 Unsteadiness on feet: Secondary | ICD-10-CM | POA: Diagnosis not present

## 2021-07-18 DIAGNOSIS — M810 Age-related osteoporosis without current pathological fracture: Secondary | ICD-10-CM | POA: Diagnosis not present

## 2021-07-18 DIAGNOSIS — Z9221 Personal history of antineoplastic chemotherapy: Secondary | ICD-10-CM | POA: Diagnosis not present

## 2021-07-18 DIAGNOSIS — I714 Abdominal aortic aneurysm, without rupture, unspecified: Secondary | ICD-10-CM | POA: Diagnosis not present

## 2021-07-18 DIAGNOSIS — R471 Dysarthria and anarthria: Secondary | ICD-10-CM | POA: Diagnosis not present

## 2021-07-18 DIAGNOSIS — G912 (Idiopathic) normal pressure hydrocephalus: Secondary | ICD-10-CM | POA: Diagnosis not present

## 2021-07-18 DIAGNOSIS — Z85118 Personal history of other malignant neoplasm of bronchus and lung: Secondary | ICD-10-CM | POA: Diagnosis not present

## 2021-07-27 DIAGNOSIS — M810 Age-related osteoporosis without current pathological fracture: Secondary | ICD-10-CM | POA: Diagnosis not present

## 2021-07-27 DIAGNOSIS — R471 Dysarthria and anarthria: Secondary | ICD-10-CM | POA: Diagnosis not present

## 2021-07-27 DIAGNOSIS — R2681 Unsteadiness on feet: Secondary | ICD-10-CM | POA: Diagnosis not present

## 2021-07-27 DIAGNOSIS — M6281 Muscle weakness (generalized): Secondary | ICD-10-CM | POA: Diagnosis not present

## 2021-07-27 DIAGNOSIS — E441 Mild protein-calorie malnutrition: Secondary | ICD-10-CM | POA: Diagnosis not present

## 2021-07-27 DIAGNOSIS — G912 (Idiopathic) normal pressure hydrocephalus: Secondary | ICD-10-CM | POA: Diagnosis not present

## 2021-07-27 DIAGNOSIS — I714 Abdominal aortic aneurysm, without rupture, unspecified: Secondary | ICD-10-CM | POA: Diagnosis not present

## 2021-08-11 ENCOUNTER — Encounter: Payer: Self-pay | Admitting: *Deleted

## 2021-08-15 ENCOUNTER — Telehealth: Payer: Self-pay | Admitting: Neurology

## 2021-08-15 ENCOUNTER — Ambulatory Visit (INDEPENDENT_AMBULATORY_CARE_PROVIDER_SITE_OTHER): Payer: PPO | Admitting: Neurology

## 2021-08-15 ENCOUNTER — Encounter: Payer: Self-pay | Admitting: Neurology

## 2021-08-15 VITALS — BP 166/84 | HR 76 | Ht 65.0 in | Wt 95.0 lb

## 2021-08-15 DIAGNOSIS — Z982 Presence of cerebrospinal fluid drainage device: Secondary | ICD-10-CM | POA: Diagnosis not present

## 2021-08-15 DIAGNOSIS — R29898 Other symptoms and signs involving the musculoskeletal system: Secondary | ICD-10-CM

## 2021-08-15 DIAGNOSIS — R296 Repeated falls: Secondary | ICD-10-CM

## 2021-08-15 DIAGNOSIS — R471 Dysarthria and anarthria: Secondary | ICD-10-CM | POA: Diagnosis not present

## 2021-08-15 DIAGNOSIS — G20 Parkinson's disease: Secondary | ICD-10-CM

## 2021-08-15 DIAGNOSIS — R29818 Other symptoms and signs involving the nervous system: Secondary | ICD-10-CM

## 2021-08-15 DIAGNOSIS — R634 Abnormal weight loss: Secondary | ICD-10-CM

## 2021-08-15 DIAGNOSIS — G912 (Idiopathic) normal pressure hydrocephalus: Secondary | ICD-10-CM | POA: Diagnosis not present

## 2021-08-15 DIAGNOSIS — R5381 Other malaise: Secondary | ICD-10-CM

## 2021-08-15 NOTE — Progress Notes (Signed)
Subjective:    Patient ID: Krista Evans is a 76 y.o. female.  HPI    Star Age, MD, PhD [facility] [address], [address] [place], [place] [address]   Dear Dr. Caryl Comes,    I saw your patient, Krista Evans, upon your kind request in my neurologic clinic today for initial consultation of her generalized weakness and dysarthria.  The patient is accompanied by her husband and her son today. Of note, she missed an appointment on 05/18/21. As you know, 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  She has fallen repeatedly, she does not really use her walker with safety.  She needs help with all her ADLs.  Her husband supplements her history as well as her son.  She feels that she does not have control over her left upper extremity.  She just recently started home health PT and OT through [facility].  She lives with her husband, her son lives about 20 minutes away.  She has a reasonably good appetite in the morning and for lunch but does not eat anything after 2 PM except for maybe a snack, she has had weight loss.  Her son feels that she is not fully safe to use her walker any longer.  She reports having had a contracture in her left arm and cannot really use it any longer.  I reviewed your office note from 04/18/2021.  Her daughter reported that patient was having difficulty caring for herself.  She did not have any recent headaches or vision changes.  She has had some near falls, she was using a walker.  She had blood work through your  office including CBC, CMP, lipid panel, TSH.  She also had a urinalysis.  She was advised to follow-up with neurosurgery.  Blood work on 04/18/2021 showed a vitamin B12 level of 672, CBC with differential showed RBC below normal at 3.62, hemoglobin mildly low at 11.2, hematocrit mildly low at 34.1, CMP showed benign findings with glucose 95, sodium 140, potassium 4.0, AST 18, ALT 12, alk phos 59, BUN 24, creatinine 0.7.  Urinalysis showed cloudy urine, few bacteria, otherwise benign.    She had a head CT without contrast on 11/12/2020 and I reviewed the results: IMPRESSION: 1. When comparing across modalities to recent MRI from October 26, 2020, similar size of the ventricular system with ventricular catheter in place. 2. Similar bilateral subdural collections, measuring up to 5 mm in thickness on the right and 3 mm on the left. While nonspecific, this finding can be seen with over shunting in the correct clinical setting.  She had a brain MRI with and without contrast on 10/26/2020 and I reviewed the results: IMPRESSION: 1. Stable position of the right parietal ventricular catheter. 2. Stable size and configuration of the ventricular system. 3. New subdural collections overlying the bilateral cerebral convexities measuring up to approximately 3 mm. This can be seen in the setting of over shunting. Infectious pachymeningitis or metastatic pachymeningeal disease are considered unlikely.  She has followed with neurosurgery, Dr. Arnoldo Morale as well as Viona Gilmore, NP.  I had evaluated  her about 4 years ago for her NPH and she was referred to neurosurgery, she requested to see Dr. Arnoldo Morale.  Previously:   05/30/17: 76 year old right-handed woman with an underlying medical history of lung cancer status post partial lobectomy, celiac disease managed with gluten-free diet, fall with pelvic fracture in April 2018 with subsequent inpatient rehabilitation, who has had a gait disorder for the past few  years, about 3-4 years, per son. Gait disorder was gradual in onset, he noted that she started walking more wider based on slowly and cautiously. Up until her fall last year she was able to walk independently however. Her fall made her gait disorder much worse, this was a big setback for her. Her husband adds that she lost confidence and she is afraid of falling. I reviewed your office note from 04/10/2017, which you kindly included. She was seen at [facility] clinic neurology in October 2018. I reviewed the note. She has been in physical therapy. She recently had a brain MRI with and without contrast on 03/17/2017, I reviewed the results, including the report and images through the PACS system: IMPRESSION: 1. No acute or subacute infarct. Moderate for age cerebral white matter signal changes which are most commonly due to chronic small vessel disease. But no prior cortical infarct is identified, and no prior ischemia identified in the deep gray matter nuclei, brainstem, or cerebellum. 2. Lateral and third ventriculomegaly is nonspecific but favored to be ex vacuo in nature. In the appropriate clinical setting consider normal pressure hydrocephalus. 3. Subtle right frontal convexity meningioma, appears inconsequential. 4.  No metastatic disease identified.   She does not endorse much in the way of memory loss. She has had some slurring of speech from time to time, some slowness in speech, denies any bladder dysfunction, no incontinence reported. She has not had any serious or recent falls but has had a couple of falls since her significant fall with pelvic fracture last year. Her husband reports that she slipped one time and had a forceful sitting down event.  She is fearful of falling. She has to either hold onto someone or uses her walker for shorter distances but feels insecure. When she was in physical therapy last year she did not feel she made any progress. Her husband feels that she had regressed  after that. She lives with her husband in a single story home but there are 3 steps to get inside the house. She has a daughter who is not involved in the care of his far as I understand. She quit smoking in 1988, she does not utilize alcohol on a regular basis, she drinks some caffeine.  Her Past Medical History Is Significant For: Past Medical History:  Diagnosis Date   AAA (abdominal aortic aneurysm) ([place]) 08/2020   without rupture; 3.3 cm by CT 08/2020   Abnormal Q waves on electrocardiogram    Anemia    followed by Dr Oliva Bustard   Aortic atherosclerosis [facility])    "I could possibly have it, my grandmother had it"   Cancer ([place]) 05/2011   Right upper Lung CA with partial Lobectomy.   Celiac disease    Celiac disease    by endoscopy   Dyspnea    with exertion   History of pneumonia    Hyperlipidemia    Hyperlipidemia    Lung mass    right upper lobe lung mass, 3.3x2.7 cm by CT 5/13. Resected by Dr Marta Lamas 6/13; path with adenocarcinoma, stage T3, with close but  negative margins. S/p chemotherapy and radiation therapy.   Meningioma ([facility])    right frontal meningioma, 15 mm x 5 mm, on MRI 3/19   Onychomycosis    followed by Dr Milinda Pointer. Lamisil treatment initiated 3/11   Osteoporosis    age related   Personal history of chemotherapy    Personal history of radiation therapy    Thoracic aortic ectasia ([place]) 09/15/2020   3.8 cm by CT 8/22    Her Past Surgical History Is Significant For: Past Surgical History:  Procedure Laterality Date   COLONOSCOPY     ESOPHAGOGASTRODUODENOSCOPY     LUNG LOBECTOMY     right lung   VENTRICULOPERITONEAL SHUNT Right 07/03/2017   Procedure: SHUNT INSERTION VENTRICULAR-PERITONEAL;  Surgeon: Newman Pies, MD;  Location: [facility];  Service: Neurosurgery;  Laterality: Right;    Her Family History Is Significant For: Family History  Problem Relation Age of Onset   Asthma Mother    Heart disease Mother    Hypertension Mother    COPD Father    CAD  Sister    Diabetes Maternal Grandmother    Diabetes Maternal Grandfather    Breast cancer Cousin 11   Parkinson's disease Neg Hx     Her Social History Is Significant For: Social History   Socioeconomic History   Marital status: Married    Spouse name: Not on file   Number of children: Not on file   Years of education: Not on file   Highest education level: Not on file  Occupational History   Not on file  Tobacco Use   Smoking status: Former    Types: Cigarettes    Quit date: 02/28/1986    Years since quitting: 35.4   Smokeless tobacco: Never  Vaping Use   Vaping Use: Never used  Substance and Sexual Activity   Alcohol use: No    Alcohol/week: 0.0 standard drinks of alcohol   Drug use: No   Sexual activity: Not on file  Other Topics Concern   Not on file  Social History Narrative   Lives at home with husband   Social Determinants of Health   Financial Resource Strain: Not on file  Food Insecurity: Not on file  Transportation Needs: Not on file  Physical Activity: Not on file  Stress: Not on file  Social Connections: Not on file    Her Allergies Are:  Allergies  Allergen Reactions   Barley Grass Other (See Comments)    And rye Doesn't absorb in small intestines   Gluten Meal Other (See Comments)    Cant absorb in small intestines    Wheat Bran Other (See Comments)    Doesn't absorb in small intestines  :   Her Current Medications Are:  Outpatient Encounter Medications as of 08/15/2021  Medication Sig   acetaminophen (TYLENOL) 325 MG tablet Take 650 mg by mouth every 4 (four) hours as needed. Patient states she takes as needed.   alendronate (FOSAMAX) 70 MG tablet Take 70 mg by mouth once a week. Take with a full glass of water on an empty stomach.   Calcium Carb-Cholecalciferol (CALCIUM 600 + D PO) Take by mouth daily.   Cholecalciferol (VITAMIN D3) 125 MCG (5000 UT) CAPS Take by mouth.   docusate sodium (COLACE) 100 MG capsule Take 1 capsule (100 mg  total) by mouth 2 (two) times daily. (Patient taking differently: Take 100 mg by mouth as needed for mild constipation.)   HYDROcodone-acetaminophen (NORCO/VICODIN) 5-325 MG tablet Take 1 tablet  by mouth every 6 (six) hours as needed for severe pain.   calcium carbonate (TUMS - DOSED IN MG ELEMENTAL CALCIUM) 500 MG chewable tablet Chew 2 tablets by mouth daily.    Multiple Vitamin (MULTI-VITAMINS) TABS Take 1 tablet by mouth daily.    No facility-administered encounter medications on file as of 08/15/2021.  :   Review of Systems:  Out of a complete 14 point review of systems, all are reviewed and negative with the exception of these symptoms as listed below:   Review of Systems  Neurological:        Pt here weakness  and speech declining  Husband states patient is losing strength in left arm and hand Pt unable to write Son states patient had shunt placed in 2019  it worked for a while     Objective:  Neurological Exam  Physical Exam Physical Examination:   Vitals:   08/15/21 1449  BP: (!) 166/84  Pulse: 76   General Examination: The patient is a very pleasant 76 y.o. female in no acute distress.  She is situated in her wheelchair, she is frail and deconditioned appearing, unable to provide her history with details provided by her husband and son, she has difficulty with her speech   HEENT: Normocephalic, atraumatic, pupils are equal, round and reactive to light, corrective eyeglasses in place.  She seems to be mildly photophobic, she has difficulty tracking with significant saccadic breakdown.  She has moderate facial masking and moderate nuchal rigidity, she has dysarthria without voice tremor, no lip or jaw tremor.  She has no carotid bruits.  Hearing grossly intact.  Airway examination reveals mild mouth dryness.  Slow tongue movements.  Chest: Clear to auscultation without wheezing, rhonchi or crackles noted.  Heart: S1+S2+0, regular and normal without murmurs, rubs or gallops  noted.   Abdomen: Soft, non-tender and non-distended with normal bowel sounds appreciated on auscultation.  Extremities: There is no pitting edema in the distal lower extremities bilaterally.   Skin: Warm and dry without trophic changes noted.   Musculoskeletal: exam reveals thin extremities.  Increase in tone in left upper extremity with gegenhalten noted.  Neurologically:  Mental status: The patient is awake, alert and oriented in all 4 spheres. Her immediate and remote memory, attention, language skills and fund of knowledge are fairly appropriate.  She has slowness in her thinking.  She has difficulty with enunciation and dysarthria but it is intelligible speech.   Thought process is linear. Mood is normal and affect is normal.  Cranial nerves II - XII are as described above under HEENT exam.  Motor exam: Thin bulk, global strength of about 4 out of 5, slightly weaker in left hand grip strength, increase in tone in all 4 extremities, left upper extremity with gegenhalten or paratonia noted.  No resting tremor.  Reflexes about 1+ in the lower extremities, 1-2+ in the upper extremities, Romberg is not testable safely.  Fine motor skills are moderately impaired on the right and moderate to severely impaired on the left.   Cerebellar testing: No dysmetria or intention tremor. There is no truncal or gait ataxia.  Sensory exam: intact to light touch in the upper and lower extremities.  Gait, station and balance: I did not have her stand or walk for me today.  She is in a wheelchair and did not bring her walker and son also reports that she is unstable even with a walker and has fallen.   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  She had VP shunt placement for NPH in 2019 and initially did quite well according to her husband and son and has had decline in her overall functioning, deconditioning, recurrent falls, appetite loss and weight loss as well as fine motor dyscontrol over the past several months, more so in the past 3 months.  Her history is complicated, differential diagnosis is vast, examination in part concerning for parkinsonism with the main differential diagnosis of PSP versus cortical basal ganglionic degeneration.  I highly doubt that she has idiopathic Parkinson's disease.  I would not favor trying her on levodopa therapy for fear of side effects.  She brain MRI in October 2022.  She had lab through your office in the recent past which were extensive.  I would like to proceed with another brain MRI with and without contrast given her NPH history as well is lung cancer history.  We may proceed with a DaTscan in the near future and I explained the scan to the patient and her family at length today, it may help [place] diagnosing an underlying parkinsonian syndrome.  Unfortunately, treatment options are very limited and I think the focus will have to stay on supportive treatment, fall prevention, improving nutrition and weight gain as well as strengthening.  She is already in a home health therapy with OT and PT and I made a referral to home health nursing.  We will call them with the MRI results and think about proceeding with a DaTscan next.  I answered all their questions today and the patient and her husband and son were in agreement. Thank you very much for allowing me to participate in the care of this nice patient. If I can be of any further assistance to you please do not hesitate to call me at  [phone].  Sincerely,   Star Age, MD, PhD This was an extended visit of over 1 hour.

## 2021-08-15 NOTE — Telephone Encounter (Signed)
[employer] sent to [facility]

## 2021-08-15 NOTE — Patient Instructions (Addendum)
I think you have signs and symptoms of parkinson's like disease, called parkinsonism.   You may have some symptoms that can mimic parkinson's disease. This can affect your balance, your memory, your mood, your bowel and bladder function, your posture, balance and walking and your activities of daily living. However, we can consider supportive treatments and even medication down the road for symptomatic treatment.   I do want to suggest a few things today:  Remember to drink plenty of fluid at least 6 glasses (8 oz each), eat healthy meals and do not skip any meals. Try to eat protein with a every meal and eat a healthy snack such as fruit or nuts in between meals.   I will make a referral to home health nursing. I am not convinced that you are safe to use a walker by yourself any longer.  As far as your medications are concerned, I would like hold off on any meds at this time.   As far as diagnostic testing, I will order: We will do a brain scan, called MRI and call you with the test results. We will have to schedule you for this on a separate date. This test requires authorization from your insurance, and we will take care of the insurance process.  We may consider a different scan in the near future, called a DaT scan: This is a specialized brain scan designed to help with diagnosis of tremor disorders. A radioactive marker gets injected and the uptake is measured in the brain and compared to normal controls and right side is compared to the left, a change in uptake can help with diagnosis of certain tremor disorders. A brain MRI on the other hand is a brain scan that helps look at the brain structure in more detail overall and look for age-related changes, blood vessel related changes and look for stroke and volume loss which we call atrophy.

## 2021-08-16 ENCOUNTER — Telehealth: Payer: Self-pay | Admitting: Neurology

## 2021-08-16 DIAGNOSIS — I714 Abdominal aortic aneurysm, without rupture, unspecified: Secondary | ICD-10-CM | POA: Diagnosis not present

## 2021-08-16 DIAGNOSIS — Z923 Personal history of irradiation: Secondary | ICD-10-CM | POA: Diagnosis not present

## 2021-08-16 DIAGNOSIS — M810 Age-related osteoporosis without current pathological fracture: Secondary | ICD-10-CM | POA: Diagnosis not present

## 2021-08-16 DIAGNOSIS — R471 Dysarthria and anarthria: Secondary | ICD-10-CM | POA: Diagnosis not present

## 2021-08-16 DIAGNOSIS — G912 (Idiopathic) normal pressure hydrocephalus: Secondary | ICD-10-CM | POA: Diagnosis not present

## 2021-08-16 DIAGNOSIS — E441 Mild protein-calorie malnutrition: Secondary | ICD-10-CM | POA: Diagnosis not present

## 2021-08-16 DIAGNOSIS — E785 Hyperlipidemia, unspecified: Secondary | ICD-10-CM | POA: Diagnosis not present

## 2021-08-16 DIAGNOSIS — M6281 Muscle weakness (generalized): Secondary | ICD-10-CM | POA: Diagnosis not present

## 2021-08-16 DIAGNOSIS — Z9221 Personal history of antineoplastic chemotherapy: Secondary | ICD-10-CM | POA: Diagnosis not present

## 2021-08-16 DIAGNOSIS — R2681 Unsteadiness on feet: Secondary | ICD-10-CM | POA: Diagnosis not present

## 2021-08-16 DIAGNOSIS — Z85118 Personal history of other malignant neoplasm of bronchus and lung: Secondary | ICD-10-CM | POA: Diagnosis not present

## 2021-08-16 NOTE — Telephone Encounter (Signed)
[facility] is taking this patient.

## 2021-08-23 ENCOUNTER — Telehealth: Payer: Self-pay | Admitting: Neurology

## 2021-08-23 DIAGNOSIS — Z982 Presence of cerebrospinal fluid drainage device: Secondary | ICD-10-CM

## 2021-08-23 DIAGNOSIS — R29818 Other symptoms and signs involving the nervous system: Secondary | ICD-10-CM

## 2021-08-23 DIAGNOSIS — G912 (Idiopathic) normal pressure hydrocephalus: Secondary | ICD-10-CM

## 2021-08-23 DIAGNOSIS — D329 Benign neoplasm of meninges, unspecified: Secondary | ICD-10-CM | POA: Diagnosis not present

## 2021-08-23 DIAGNOSIS — I714 Abdominal aortic aneurysm, without rupture, unspecified: Secondary | ICD-10-CM | POA: Diagnosis not present

## 2021-08-23 DIAGNOSIS — R5381 Other malaise: Secondary | ICD-10-CM

## 2021-08-23 DIAGNOSIS — M810 Age-related osteoporosis without current pathological fracture: Secondary | ICD-10-CM | POA: Diagnosis not present

## 2021-08-23 DIAGNOSIS — D649 Anemia, unspecified: Secondary | ICD-10-CM | POA: Diagnosis not present

## 2021-08-23 DIAGNOSIS — E7849 Other hyperlipidemia: Secondary | ICD-10-CM | POA: Diagnosis not present

## 2021-08-23 DIAGNOSIS — J069 Acute upper respiratory infection, unspecified: Secondary | ICD-10-CM | POA: Diagnosis not present

## 2021-08-23 DIAGNOSIS — Z0000 Encounter for general adult medical examination without abnormal findings: Secondary | ICD-10-CM | POA: Diagnosis not present

## 2021-08-23 DIAGNOSIS — I77810 Thoracic aortic ectasia: Secondary | ICD-10-CM | POA: Diagnosis not present

## 2021-08-23 DIAGNOSIS — G20 Parkinson's disease: Secondary | ICD-10-CM

## 2021-08-23 DIAGNOSIS — I700 Atherosclerosis of aorta: Secondary | ICD-10-CM | POA: Diagnosis not present

## 2021-08-23 DIAGNOSIS — E441 Mild protein-calorie malnutrition: Secondary | ICD-10-CM | POA: Diagnosis not present

## 2021-08-23 DIAGNOSIS — Z85118 Personal history of other malignant neoplasm of bronchus and lung: Secondary | ICD-10-CM | POA: Diagnosis not present

## 2021-08-23 NOTE — Telephone Encounter (Signed)
This patient is already established with [facility] for PT and OT. I sent a message to Mearl Latin asking her to add nursing and a home health aide.

## 2021-08-23 NOTE — Telephone Encounter (Signed)
From [facility]:  We do not have [place].  Please have that removed and we can pull the new referral for nursing.

## 2021-08-23 NOTE — Telephone Encounter (Signed)
New HH referral placed.

## 2021-08-23 NOTE — Telephone Encounter (Signed)
I let [facility] know a new referral has been placed.

## 2021-08-23 NOTE — Addendum Note (Signed)
Addended by: Star Age on: 08/23/2021 12:51 PM   Modules accepted: Orders

## 2021-08-26 ENCOUNTER — Ambulatory Visit: Admission: RE | Admit: 2021-08-26 | Payer: PPO | Source: Ambulatory Visit

## 2021-09-07 ENCOUNTER — Ambulatory Visit
Admission: RE | Admit: 2021-09-07 | Discharge: 2021-09-07 | Disposition: A | Discharge status: Home / Self Care | Payer: PPO | Source: Ambulatory Visit | Attending: Neurology | Admitting: Neurology

## 2021-09-07 DIAGNOSIS — G20 Parkinson's disease: Secondary | ICD-10-CM | POA: Diagnosis not present

## 2021-09-07 DIAGNOSIS — R471 Dysarthria and anarthria: Secondary | ICD-10-CM | POA: Diagnosis not present

## 2021-09-07 DIAGNOSIS — R634 Abnormal weight loss: Secondary | ICD-10-CM | POA: Diagnosis not present

## 2021-09-07 DIAGNOSIS — R5381 Other malaise: Secondary | ICD-10-CM | POA: Insufficient documentation

## 2021-09-07 DIAGNOSIS — R29818 Other symptoms and signs involving the nervous system: Secondary | ICD-10-CM | POA: Diagnosis not present

## 2021-09-07 DIAGNOSIS — R29898 Other symptoms and signs involving the musculoskeletal system: Secondary | ICD-10-CM | POA: Diagnosis not present

## 2021-09-07 DIAGNOSIS — Z982 Presence of cerebrospinal fluid drainage device: Secondary | ICD-10-CM | POA: Insufficient documentation

## 2021-09-07 DIAGNOSIS — G912 (Idiopathic) normal pressure hydrocephalus: Secondary | ICD-10-CM | POA: Insufficient documentation

## 2021-09-07 DIAGNOSIS — R296 Repeated falls: Secondary | ICD-10-CM | POA: Insufficient documentation

## 2021-09-07 DIAGNOSIS — R531 Weakness: Secondary | ICD-10-CM | POA: Diagnosis not present

## 2021-09-07 MED ORDER — GADOBUTROL 1 MMOL/ML IV SOLN
4.0000 mL | Freq: Once | INTRAVENOUS | Status: AC | PRN
  Administered 2021-09-07: 4 mL via INTRAVENOUS

## 2021-09-08 ENCOUNTER — Telehealth: Payer: Self-pay | Admitting: *Deleted

## 2021-09-08 DIAGNOSIS — G910 Communicating hydrocephalus: Secondary | ICD-10-CM | POA: Diagnosis not present

## 2021-09-08 NOTE — Telephone Encounter (Signed)
-----   Message from Star Age, MD sent at 09/08/2021 10:56 AM EDT ----- Please call patient and advise her or her family member on DPR that her recent brain MRI with and without contrast did not show any acute findings.  Chronic findings were seen which appear to be stable compared to her brain MRI from 2022. Stable appearance of her shunt catheter.

## 2021-09-08 NOTE — Telephone Encounter (Signed)
LVM for patient to call back to go over test results.

## 2021-09-12 NOTE — Telephone Encounter (Signed)
Patient returned the call and I discussed her brain MRI results with her. Pt was very appreciative and she asked for a hard copy of the results.

## 2021-09-21 DIAGNOSIS — Z923 Personal history of irradiation: Secondary | ICD-10-CM | POA: Diagnosis not present

## 2021-09-21 DIAGNOSIS — M6281 Muscle weakness (generalized): Secondary | ICD-10-CM | POA: Diagnosis not present

## 2021-09-21 DIAGNOSIS — G912 (Idiopathic) normal pressure hydrocephalus: Secondary | ICD-10-CM | POA: Diagnosis not present

## 2021-09-21 DIAGNOSIS — R471 Dysarthria and anarthria: Secondary | ICD-10-CM | POA: Diagnosis not present

## 2021-09-21 DIAGNOSIS — E441 Mild protein-calorie malnutrition: Secondary | ICD-10-CM | POA: Diagnosis not present

## 2021-09-21 DIAGNOSIS — Z85118 Personal history of other malignant neoplasm of bronchus and lung: Secondary | ICD-10-CM | POA: Diagnosis not present

## 2021-09-21 DIAGNOSIS — Z9221 Personal history of antineoplastic chemotherapy: Secondary | ICD-10-CM | POA: Diagnosis not present

## 2021-09-21 DIAGNOSIS — M810 Age-related osteoporosis without current pathological fracture: Secondary | ICD-10-CM | POA: Diagnosis not present

## 2021-09-21 DIAGNOSIS — I714 Abdominal aortic aneurysm, without rupture, unspecified: Secondary | ICD-10-CM | POA: Diagnosis not present

## 2021-09-21 DIAGNOSIS — R2681 Unsteadiness on feet: Secondary | ICD-10-CM | POA: Diagnosis not present

## 2021-09-26 DIAGNOSIS — I77810 Thoracic aortic ectasia: Secondary | ICD-10-CM | POA: Diagnosis not present

## 2021-09-26 DIAGNOSIS — E7849 Other hyperlipidemia: Secondary | ICD-10-CM | POA: Diagnosis not present

## 2021-09-26 DIAGNOSIS — I700 Atherosclerosis of aorta: Secondary | ICD-10-CM | POA: Diagnosis not present

## 2021-09-26 DIAGNOSIS — D649 Anemia, unspecified: Secondary | ICD-10-CM | POA: Diagnosis not present

## 2021-09-26 DIAGNOSIS — G912 (Idiopathic) normal pressure hydrocephalus: Secondary | ICD-10-CM | POA: Diagnosis not present

## 2021-09-26 DIAGNOSIS — E441 Mild protein-calorie malnutrition: Secondary | ICD-10-CM | POA: Diagnosis not present

## 2021-09-26 DIAGNOSIS — R2681 Unsteadiness on feet: Secondary | ICD-10-CM | POA: Diagnosis not present

## 2021-09-26 DIAGNOSIS — Z85118 Personal history of other malignant neoplasm of bronchus and lung: Secondary | ICD-10-CM | POA: Diagnosis not present

## 2021-09-26 DIAGNOSIS — M6281 Muscle weakness (generalized): Secondary | ICD-10-CM | POA: Diagnosis not present

## 2021-09-26 DIAGNOSIS — I714 Abdominal aortic aneurysm, without rupture, unspecified: Secondary | ICD-10-CM | POA: Diagnosis not present

## 2021-09-26 DIAGNOSIS — D329 Benign neoplasm of meninges, unspecified: Secondary | ICD-10-CM | POA: Diagnosis not present

## 2021-09-26 DIAGNOSIS — Z9221 Personal history of antineoplastic chemotherapy: Secondary | ICD-10-CM | POA: Diagnosis not present

## 2021-09-26 DIAGNOSIS — M810 Age-related osteoporosis without current pathological fracture: Secondary | ICD-10-CM | POA: Diagnosis not present

## 2021-09-26 DIAGNOSIS — R471 Dysarthria and anarthria: Secondary | ICD-10-CM | POA: Diagnosis not present

## 2021-10-20 DIAGNOSIS — M6281 Muscle weakness (generalized): Secondary | ICD-10-CM | POA: Diagnosis not present

## 2021-10-20 DIAGNOSIS — E441 Mild protein-calorie malnutrition: Secondary | ICD-10-CM | POA: Diagnosis not present

## 2021-10-20 DIAGNOSIS — R2681 Unsteadiness on feet: Secondary | ICD-10-CM | POA: Diagnosis not present

## 2021-10-20 DIAGNOSIS — G912 (Idiopathic) normal pressure hydrocephalus: Secondary | ICD-10-CM | POA: Diagnosis not present

## 2021-10-20 DIAGNOSIS — Z923 Personal history of irradiation: Secondary | ICD-10-CM | POA: Diagnosis not present

## 2021-10-20 DIAGNOSIS — R471 Dysarthria and anarthria: Secondary | ICD-10-CM | POA: Diagnosis not present

## 2021-10-20 DIAGNOSIS — I714 Abdominal aortic aneurysm, without rupture, unspecified: Secondary | ICD-10-CM | POA: Diagnosis not present

## 2021-10-20 DIAGNOSIS — M810 Age-related osteoporosis without current pathological fracture: Secondary | ICD-10-CM | POA: Diagnosis not present

## 2021-10-20 DIAGNOSIS — Z85118 Personal history of other malignant neoplasm of bronchus and lung: Secondary | ICD-10-CM | POA: Diagnosis not present

## 2021-10-20 DIAGNOSIS — Z9221 Personal history of antineoplastic chemotherapy: Secondary | ICD-10-CM | POA: Diagnosis not present

## 2021-11-02 DIAGNOSIS — Z23 Encounter for immunization: Secondary | ICD-10-CM | POA: Diagnosis not present

## 2021-11-23 DIAGNOSIS — M6281 Muscle weakness (generalized): Secondary | ICD-10-CM | POA: Diagnosis not present

## 2021-11-23 DIAGNOSIS — Z85118 Personal history of other malignant neoplasm of bronchus and lung: Secondary | ICD-10-CM | POA: Diagnosis not present

## 2021-11-23 DIAGNOSIS — Z9221 Personal history of antineoplastic chemotherapy: Secondary | ICD-10-CM | POA: Diagnosis not present

## 2021-11-23 DIAGNOSIS — M810 Age-related osteoporosis without current pathological fracture: Secondary | ICD-10-CM | POA: Diagnosis not present

## 2021-11-23 DIAGNOSIS — I714 Abdominal aortic aneurysm, without rupture, unspecified: Secondary | ICD-10-CM | POA: Diagnosis not present

## 2021-11-23 DIAGNOSIS — R471 Dysarthria and anarthria: Secondary | ICD-10-CM | POA: Diagnosis not present

## 2021-11-23 DIAGNOSIS — G912 (Idiopathic) normal pressure hydrocephalus: Secondary | ICD-10-CM | POA: Diagnosis not present

## 2021-11-23 DIAGNOSIS — R2681 Unsteadiness on feet: Secondary | ICD-10-CM | POA: Diagnosis not present

## 2021-11-23 DIAGNOSIS — E441 Mild protein-calorie malnutrition: Secondary | ICD-10-CM | POA: Diagnosis not present

## 2021-11-23 DIAGNOSIS — Z923 Personal history of irradiation: Secondary | ICD-10-CM | POA: Diagnosis not present

## 2021-11-26 DIAGNOSIS — R2681 Unsteadiness on feet: Secondary | ICD-10-CM | POA: Diagnosis not present

## 2021-11-26 DIAGNOSIS — M6281 Muscle weakness (generalized): Secondary | ICD-10-CM | POA: Diagnosis not present

## 2021-11-26 DIAGNOSIS — E441 Mild protein-calorie malnutrition: Secondary | ICD-10-CM | POA: Diagnosis not present

## 2021-11-26 DIAGNOSIS — R471 Dysarthria and anarthria: Secondary | ICD-10-CM | POA: Diagnosis not present

## 2021-11-26 DIAGNOSIS — G912 (Idiopathic) normal pressure hydrocephalus: Secondary | ICD-10-CM | POA: Diagnosis not present

## 2021-11-26 DIAGNOSIS — I714 Abdominal aortic aneurysm, without rupture, unspecified: Secondary | ICD-10-CM | POA: Diagnosis not present

## 2021-11-26 DIAGNOSIS — M810 Age-related osteoporosis without current pathological fracture: Secondary | ICD-10-CM | POA: Diagnosis not present

## 2021-11-29 DIAGNOSIS — H2513 Age-related nuclear cataract, bilateral: Secondary | ICD-10-CM | POA: Diagnosis not present

## 2021-12-16 DIAGNOSIS — R2681 Unsteadiness on feet: Secondary | ICD-10-CM | POA: Diagnosis not present

## 2021-12-16 DIAGNOSIS — M6281 Muscle weakness (generalized): Secondary | ICD-10-CM | POA: Diagnosis not present

## 2021-12-16 DIAGNOSIS — R471 Dysarthria and anarthria: Secondary | ICD-10-CM | POA: Diagnosis not present

## 2021-12-16 DIAGNOSIS — G912 (Idiopathic) normal pressure hydrocephalus: Secondary | ICD-10-CM | POA: Diagnosis not present

## 2021-12-16 DIAGNOSIS — Z9221 Personal history of antineoplastic chemotherapy: Secondary | ICD-10-CM | POA: Diagnosis not present

## 2021-12-16 DIAGNOSIS — I714 Abdominal aortic aneurysm, without rupture, unspecified: Secondary | ICD-10-CM | POA: Diagnosis not present

## 2021-12-16 DIAGNOSIS — Z923 Personal history of irradiation: Secondary | ICD-10-CM | POA: Diagnosis not present

## 2021-12-16 DIAGNOSIS — E441 Mild protein-calorie malnutrition: Secondary | ICD-10-CM | POA: Diagnosis not present

## 2021-12-16 DIAGNOSIS — M810 Age-related osteoporosis without current pathological fracture: Secondary | ICD-10-CM | POA: Diagnosis not present

## 2021-12-16 DIAGNOSIS — Z85118 Personal history of other malignant neoplasm of bronchus and lung: Secondary | ICD-10-CM | POA: Diagnosis not present

## 2021-12-21 DIAGNOSIS — E7849 Other hyperlipidemia: Secondary | ICD-10-CM | POA: Diagnosis not present

## 2021-12-21 DIAGNOSIS — I700 Atherosclerosis of aorta: Secondary | ICD-10-CM | POA: Diagnosis not present

## 2021-12-21 DIAGNOSIS — E441 Mild protein-calorie malnutrition: Secondary | ICD-10-CM | POA: Diagnosis not present

## 2021-12-21 DIAGNOSIS — I77810 Thoracic aortic ectasia: Secondary | ICD-10-CM | POA: Diagnosis not present

## 2021-12-21 DIAGNOSIS — D649 Anemia, unspecified: Secondary | ICD-10-CM | POA: Diagnosis not present

## 2021-12-27 DIAGNOSIS — H2512 Age-related nuclear cataract, left eye: Secondary | ICD-10-CM | POA: Diagnosis not present

## 2021-12-28 DIAGNOSIS — K900 Celiac disease: Secondary | ICD-10-CM | POA: Diagnosis not present

## 2021-12-28 DIAGNOSIS — I77810 Thoracic aortic ectasia: Secondary | ICD-10-CM | POA: Diagnosis not present

## 2021-12-28 DIAGNOSIS — D649 Anemia, unspecified: Secondary | ICD-10-CM | POA: Diagnosis not present

## 2021-12-28 DIAGNOSIS — E7849 Other hyperlipidemia: Secondary | ICD-10-CM | POA: Diagnosis not present

## 2021-12-28 DIAGNOSIS — Z7409 Other reduced mobility: Secondary | ICD-10-CM | POA: Diagnosis not present

## 2021-12-28 DIAGNOSIS — I700 Atherosclerosis of aorta: Secondary | ICD-10-CM | POA: Diagnosis not present

## 2021-12-28 DIAGNOSIS — D329 Benign neoplasm of meninges, unspecified: Secondary | ICD-10-CM | POA: Diagnosis not present

## 2021-12-28 DIAGNOSIS — E441 Mild protein-calorie malnutrition: Secondary | ICD-10-CM | POA: Diagnosis not present

## 2021-12-28 DIAGNOSIS — I714 Abdominal aortic aneurysm, without rupture, unspecified: Secondary | ICD-10-CM | POA: Diagnosis not present

## 2021-12-28 DIAGNOSIS — G912 (Idiopathic) normal pressure hydrocephalus: Secondary | ICD-10-CM | POA: Diagnosis not present

## 2021-12-29 ENCOUNTER — Encounter: Payer: Self-pay | Admitting: Ophthalmology

## 2022-01-02 NOTE — Discharge Instructions (Signed)

## 2022-01-03 ENCOUNTER — Encounter: Payer: Self-pay | Admitting: Ophthalmology

## 2022-01-04 ENCOUNTER — Other Ambulatory Visit: Payer: Self-pay

## 2022-01-04 ENCOUNTER — Ambulatory Visit: Payer: PPO | Admitting: Anesthesiology

## 2022-01-04 ENCOUNTER — Ambulatory Visit
Admission: RE | Admit: 2022-01-04 | Discharge: 2022-01-04 | Disposition: A | Discharge status: Home / Self Care | Payer: PPO | Attending: Ophthalmology | Admitting: Ophthalmology

## 2022-01-04 ENCOUNTER — Encounter: Payer: Self-pay | Admitting: Ophthalmology

## 2022-01-04 ENCOUNTER — Encounter
Admission: RE | Disposition: A | Discharge status: Home / Self Care | Payer: Self-pay | Source: Home / Self Care | Attending: Ophthalmology

## 2022-01-04 DIAGNOSIS — G20A1 Parkinson's disease without dyskinesia, without mention of fluctuations: Secondary | ICD-10-CM | POA: Diagnosis not present

## 2022-01-04 DIAGNOSIS — Z87891 Personal history of nicotine dependence: Secondary | ICD-10-CM | POA: Insufficient documentation

## 2022-01-04 DIAGNOSIS — Z85118 Personal history of other malignant neoplasm of bronchus and lung: Secondary | ICD-10-CM | POA: Insufficient documentation

## 2022-01-04 DIAGNOSIS — K900 Celiac disease: Secondary | ICD-10-CM | POA: Insufficient documentation

## 2022-01-04 DIAGNOSIS — Z982 Presence of cerebrospinal fluid drainage device: Secondary | ICD-10-CM | POA: Diagnosis not present

## 2022-01-04 DIAGNOSIS — H269 Unspecified cataract: Secondary | ICD-10-CM | POA: Diagnosis not present

## 2022-01-04 DIAGNOSIS — D649 Anemia, unspecified: Secondary | ICD-10-CM | POA: Diagnosis not present

## 2022-01-04 DIAGNOSIS — E785 Hyperlipidemia, unspecified: Secondary | ICD-10-CM | POA: Diagnosis not present

## 2022-01-04 DIAGNOSIS — H2512 Age-related nuclear cataract, left eye: Secondary | ICD-10-CM | POA: Insufficient documentation

## 2022-01-04 HISTORY — PX: CATARACT EXTRACTION W/PHACO: SHX586

## 2022-01-04 SURGERY — PHACOEMULSIFICATION, CATARACT, WITH IOL INSERTION
Anesthesia: Monitor Anesthesia Care | Site: Eye | Laterality: Left

## 2022-01-04 MED ORDER — SIGHTPATH DOSE#1 BSS IO SOLN
INTRAOCULAR | Status: DC | PRN
  Administered 2022-01-04: 1 mL

## 2022-01-04 MED ORDER — SIGHTPATH DOSE#1 NA HYALUR & NA CHOND-NA HYALUR IO KIT
PACK | INTRAOCULAR | Status: DC | PRN
  Administered 2022-01-04: 1 via OPHTHALMIC

## 2022-01-04 MED ORDER — CEFUROXIME OPHTHALMIC INJECTION 1 MG/0.1 ML
INJECTION | OPHTHALMIC | Status: DC | PRN
  Administered 2022-01-04: .1 mL via INTRACAMERAL

## 2022-01-04 MED ORDER — TETRACAINE HCL 0.5 % OP SOLN
1.0000 [drp] | OPHTHALMIC | Status: DC | PRN
  Administered 2022-01-04 (×3): 1 [drp] via OPHTHALMIC

## 2022-01-04 MED ORDER — SIGHTPATH DOSE#1 BSS IO SOLN
INTRAOCULAR | Status: DC | PRN
  Administered 2022-01-04: 15 mL

## 2022-01-04 MED ORDER — BRIMONIDINE TARTRATE-TIMOLOL 0.2-0.5 % OP SOLN
OPHTHALMIC | Status: DC | PRN
  Administered 2022-01-04: 1 [drp] via OPHTHALMIC

## 2022-01-04 MED ORDER — ARMC OPHTHALMIC DILATING DROPS
1.0000 | OPHTHALMIC | Status: DC | PRN
  Administered 2022-01-04 (×3): 1 via OPHTHALMIC

## 2022-01-04 MED ORDER — SIGHTPATH DOSE#1 BSS IO SOLN: INTRAOCULAR | Status: DC | PRN

## 2022-01-04 MED ORDER — PROPOFOL 10 MG/ML IV BOLUS
INTRAVENOUS | Status: DC | PRN
  Administered 2022-01-04: 10 mg via INTRAVENOUS
  Administered 2022-01-04 (×2): 20 mg via INTRAVENOUS

## 2022-01-04 MED ORDER — NEOMYCIN-POLYMYXIN-DEXAMETH 3.5-10000-0.1 OP OINT
TOPICAL_OINTMENT | OPHTHALMIC | Status: DC | PRN
  Administered 2022-01-04: 1 via OPHTHALMIC

## 2022-01-04 SURGICAL SUPPLY — 20 items
CANNULA ANT/CHMB 27G (MISCELLANEOUS) IMPLANT
CANNULA ANT/CHMB 27GA (MISCELLANEOUS) IMPLANT
CATARACT SUITE SIGHTPATH (MISCELLANEOUS) ×1 IMPLANT
FEE CATARACT SUITE SIGHTPATH (MISCELLANEOUS) ×1 IMPLANT
GLOVE SRG 8 PF TXTR STRL LF DI (GLOVE) ×1 IMPLANT
GLOVE SURG ENC TEXT LTX SZ7.5 (GLOVE) ×1 IMPLANT
GLOVE SURG GAMMEX PI TX LF 7.5 (GLOVE) IMPLANT
GLOVE SURG UNDER POLY LF SZ8 (GLOVE) ×1
LENS IOL TECNIS EYHANCE 22.0 (Intraocular Lens) IMPLANT
NDL FILTER BLUNT 18X1 1/2 (NEEDLE) ×1 IMPLANT
NDL RETROBULBAR .5 NSTRL (NEEDLE) IMPLANT
NEEDLE FILTER BLUNT 18X1 1/2 (NEEDLE) ×1 IMPLANT
PACK VIT ANT 23G (MISCELLANEOUS) IMPLANT
RING MALYGIN 7.0 (MISCELLANEOUS) IMPLANT
SUT ETHILON 10-0 CS-B-6CS-B-6 (SUTURE)
SUT VICRYL  9 0 (SUTURE)
SUT VICRYL 9 0 (SUTURE) IMPLANT
SUTURE EHLN 10-0 CS-B-6CS-B-6 (SUTURE) IMPLANT
SYR 3ML LL SCALE MARK (SYRINGE) ×1 IMPLANT
WATER STERILE IRR 250ML POUR (IV SOLUTION) ×1 IMPLANT

## 2022-01-04 NOTE — Op Note (Signed)
LOCATION:  [place]  PREOPERATIVE DIAGNOSIS:  Nuclear sclerotic cataract left eye.  H25.12  POSTOPERATIVE DIAGNOSIS:  Nuclear sclerotic cataract left eye.   PROCEDURE:  Phacoemulsification with posterior chamber intraocular lens implantation of the left eye.   LENS: DIB00 22.0 D PCIOL   ULTRASOUND TIME:   1 minutes 21 seconds, CDE 11.7 SURGEON:  Wyonia Hough, MD   ANESTHESIA:  Retrobulbar block of Xylocaine and Bupivacaine.   COMPLICATIONS:  None.   DESCRIPTION OF PROCEDURE:  The patient was identified in the holding room and transported to the operating room and placed in the supine position under the operating microscope.  The left eye was identified as the operative eye and a retrobulbar block was performed under intravenous sedation.  It was then prepped and draped in the usual sterile ophthalmic fashion.   A 1 millimeter clear-corneal paracentesis was made at the 1:30 position.  The anterior chamber was filled with Viscoat viscoelastic.  A 2.4 millimeter keratome was used to make a near-clear corneal incision at the 10:30 position.  A curvilinear capsulorrhexis was made with a cystotome and capsulorrhexis forceps.  Balanced salt solution was used to hydrodissect and hydrodelineate the nucleus.   Phacoemulsification was then used in stop and chop fashion to remove the lens nucleus and epinucleus.  The remaining cortex was then removed using the irrigation and aspiration handpiece.  Provisc was then placed into the capsular bag to distend it for lens placement.  A 22.0 -diopter lens was then injected into the capsular bag.  The remaining viscoelastic was aspirated.   Wounds were hydrated with balanced salt solution.  The anterior chamber was inflated to a physiologic pressure with balanced salt solution.  No wound leaks were noted. Cefuroxime 0.1 ml of a 21m/ml solution was injected into the anterior chamber for a dose of 1 mg of intracameral antibiotic at the  completion of the case.   Timolol and Brimonidine drops were placed followed by Maxitrol ointment.  The eye was patched.  The patient was taken to the recovery room in stable condition without complications of anesthesia or surgery.   Krista Evans 01/04/2022, 1:52 PM

## 2022-01-04 NOTE — Anesthesia Postprocedure Evaluation (Signed)
Anesthesia Post Note  Patient: Krista Evans  Procedure(s) Performed: CATARACT EXTRACTION PHACO AND INTRAOCULAR LENS PLACEMENT (IOC) LEFT (Left: Eye)  Patient location during evaluation: PACU Anesthesia Type: MAC Level of consciousness: awake and alert Pain management: pain level controlled Vital Signs Assessment: post-procedure vital signs reviewed and stable Respiratory status: spontaneous breathing, nonlabored ventilation and respiratory function stable Cardiovascular status: blood pressure returned to baseline and stable Postop Assessment: no apparent nausea or vomiting Anesthetic complications: no   No notable events documented.   Last Vitals:  Vitals:   01/04/22 1352 01/04/22 1400  BP: 139/65 (!) 153/69  Pulse: 76   Resp: 18   Temp: (!) 36.4 C   SpO2: 96%     Last Pain:  Vitals:   01/04/22 1352  TempSrc:   PainSc: 0-No pain                 [place] Ouch

## 2022-01-04 NOTE — H&P (Signed)
[facility]   Primary Care Physician:  Adin Hector, MD Ophthalmologist: Dr. Leandrew Koyanagi  Pre-Procedure History & Physical: HPI:  Krista Evans is a 76 y.o. female here for ophthalmic surgery.   Past Medical History:  Diagnosis Date   AAA (abdominal aortic aneurysm) ([place]) 08/2020   without rupture; 3.3 cm by CT 08/2020   Abnormal Q waves on electrocardiogram    Anemia    followed by Dr Oliva Bustard   Aortic atherosclerosis [facility])    "I could possibly have it, my grandmother had it"   Cancer ([place]) 05/2011   Right upper Lung CA with partial Lobectomy.   Celiac disease    Celiac disease    by endoscopy   Dyspnea    with exertion   History of pneumonia    Hyperlipidemia    Hyperlipidemia    Lung mass    right upper lobe lung mass, 3.3x2.7 cm by CT 5/13. Resected by Dr Marta Lamas 6/13; path with adenocarcinoma, stage T3, with close but negative margins. S/p chemotherapy and radiation therapy.   Meningioma (HCC)    right frontal meningioma, 15 mm x 5 mm, on MRI 3/19   Onychomycosis    followed by Dr Milinda Pointer. Lamisil treatment initiated 3/11   Osteoporosis    age related   Personal history of chemotherapy    Personal history of radiation therapy    Thoracic aortic ectasia ([place]) 09/15/2020   3.8 cm by CT 8/22    Past Surgical History:  Procedure Laterality Date   COLONOSCOPY     ESOPHAGOGASTRODUODENOSCOPY     LUNG LOBECTOMY     right lung   VENTRICULOPERITONEAL SHUNT Right 07/03/2017   Procedure: SHUNT INSERTION VENTRICULAR-PERITONEAL;  Surgeon: Newman Pies, MD;  Location: [place];  Service: Neurosurgery;  Laterality: Right;    Prior to Admission medications   Medication Sig Start Date End Date Taking? Authorizing Provider  acetaminophen (TYLENOL) 325 MG tablet Take 650 mg by mouth every 4 (four) hours as needed. Patient states she takes as needed.   Yes [provider]  alendronate (FOSAMAX) 70 MG tablet Take 70 mg by mouth once a week. Take  with a full glass of water on an empty stomach.   Yes [provider]  Calcium Carb-Cholecalciferol (CALCIUM 600 + D PO) Take by mouth daily.   Yes [provider]  calcium carbonate (TUMS - DOSED IN MG ELEMENTAL CALCIUM) 500 MG chewable tablet Chew 2 tablets by mouth daily.    Yes [provider]  Cholecalciferol (VITAMIN D3) 125 MCG (5000 UT) CAPS Take by mouth.   Yes [provider]  Multiple Vitamin (MULTI-VITAMINS) TABS Take 1 tablet by mouth daily.    Yes [provider]  rosuvastatin (CRESTOR) 5 MG tablet Take 5 mg by mouth daily.   Yes [provider]  triamcinolone (KENALOG) 0.025 % cream Apply 1 Application topically 2 (two) times daily.   Yes [provider]  docusate sodium (COLACE) 100 MG capsule Take 1 capsule (100 mg total) by mouth 2 (two) times daily. Patient taking differently: Take 100 mg by mouth as needed for mild constipation. 07/13/17   Angiulli, Lavon Paganini, PA-C  HYDROcodone-acetaminophen (NORCO/VICODIN) 5-325 MG tablet Take 1 tablet by mouth every 6 (six) hours as needed for severe pain. Patient not taking: Reported on 12/29/2021 07/13/17   Cathlyn Parsons, PA-C    Allergies as of 12/01/2021 - Review Complete 08/15/2021  Allergen Reaction Noted   Barley grass  Other (See Comments) 08/04/2014   Gluten meal Other (See Comments)    Wheat bran Other (See Comments) 08/04/2014    Family History  Problem Relation Age of Onset   Asthma Mother    Heart disease Mother    Hypertension Mother    COPD Father    CAD Sister    Diabetes Maternal Grandmother    Diabetes Maternal Grandfather    Breast cancer Cousin 38   Parkinson's disease Neg Hx     Social History   Socioeconomic History   Marital status: Married    Spouse name: Not on file   Number of children: Not on file   Years of education: Not on file   Highest education level: Not on file  Occupational History   Not on file  Tobacco Use   Smoking  status: Former    Types: Cigarettes    Quit date: 02/28/1986    Years since quitting: 35.8   Smokeless tobacco: Never  Vaping Use   Vaping Use: Never used  Substance and Sexual Activity   Alcohol use: No    Alcohol/week: 0.0 standard drinks of alcohol   Drug use: No   Sexual activity: Not on file  Other Topics Concern   Not on file  Social History Narrative   Lives at home with husband   Social Determinants of Health   Financial Resource Strain: Not on file  Food Insecurity: Not on file  Transportation Needs: Not on file  Physical Activity: Not on file  Stress: Not on file  Social Connections: Not on file  Intimate Partner Violence: Not on file    Review of Systems: See HPI, otherwise negative ROS  Physical Exam: Ht 5' 5.5" (1.664 m)   Wt 45.4 kg   BMI 16.39 kg/m  General:   Alert,  pleasant and cooperative in NAD Head:  Normocephalic and atraumatic. Lungs:  Clear to auscultation.    Heart:  Regular rate and rhythm.   Impression/Plan: Krista Evans is here for ophthalmic surgery.  Risks, benefits, limitations, and alternatives regarding ophthalmic surgery have been reviewed with the patient.  Questions have been answered.  All parties agreeable.   Leandrew Koyanagi, MD  01/04/2022, 12:38 PM

## 2022-01-04 NOTE — Transfer of Care (Signed)
Immediate Anesthesia Transfer of Care Note  Patient: Krista Evans  Procedure(s) Performed: CATARACT EXTRACTION PHACO AND INTRAOCULAR LENS PLACEMENT (IOC) LEFT (Left: Eye)  Patient Location: PACU  Anesthesia Type: MAC  Level of Consciousness: awake, alert  and patient cooperative  Airway and Oxygen Therapy: Patient Spontanous Breathing and Patient connected to supplemental oxygen  Post-op Assessment: Post-op Vital signs reviewed, Patient's Cardiovascular Status Stable, Respiratory Function Stable, Patent Airway and No signs of Nausea or vomiting  Post-op Vital Signs: Reviewed and stable  Complications: No notable events documented.

## 2022-01-04 NOTE — Anesthesia Preprocedure Evaluation (Addendum)
Anesthesia Evaluation  Patient identified by MRN, date of birth, ID band Patient awake    Reviewed: Allergy & Precautions, NPO status , Patient's Chart, lab work & pertinent test results  History of Anesthesia Complications Negative for: history of anesthetic complications  Airway Mallampati: II  TM Distance: >3 FB Neck ROM: Full    Dental  (+) Upper Dentures, Lower Dentures   Pulmonary former smoker Right upper Lung CA with partial Lobectomy.   breath sounds clear to auscultation       Cardiovascular Exercise Tolerance: Poor  Rhythm:Regular Rate:Normal     Neuro/Psych Hydrocephalus s/p VP shunt H/o meningioma Parkinson's.  Reports left sided weakness. Son reports she was evaluated for stroke. No new neuro deficits.     GI/Hepatic Neg liver ROS,neg GERD  ,,celiac   Endo/Other  negative endocrine ROS    Renal/GU negative Renal ROS     Musculoskeletal  (+) Arthritis , Osteoarthritis,    Abdominal Normal abdominal exam  (+)   Peds  Hematology  (+) Blood dyscrasia (Hb 11.4), anemia   Anesthesia Other Findings   Reproductive/Obstetrics                             Anesthesia Physical Anesthesia Plan  ASA: III  Anesthesia Plan: MAC   Post-op Pain Management:    Induction: Intravenous  PONV Risk Score and Plan: 3 and TIVA, Midazolam and Treatment may vary due to age or medical condition  Airway Management Planned:   Additional Equipment:   Intra-op Plan:   Post-operative Plan:   Informed Consent: I have reviewed the patients History and Physical, chart, labs and discussed the procedure including the risks, benefits and alternatives for the proposed anesthesia with the patient or authorized representative who has indicated his/her understanding and acceptance.       Plan Discussed with: CRNA and Surgeon  Anesthesia Plan Comments:         Anesthesia Quick  Evaluation

## 2022-01-05 ENCOUNTER — Encounter: Payer: Self-pay | Admitting: Ophthalmology

## 2022-01-12 NOTE — Discharge Instructions (Signed)

## 2022-01-13 DIAGNOSIS — H2511 Age-related nuclear cataract, right eye: Secondary | ICD-10-CM | POA: Diagnosis not present

## 2022-01-18 ENCOUNTER — Ambulatory Visit: Payer: PPO | Admitting: Anesthesiology

## 2022-01-18 ENCOUNTER — Ambulatory Visit
Admission: RE | Admit: 2022-01-18 | Discharge: 2022-01-18 | Disposition: A | Discharge status: Home / Self Care | Payer: PPO | Attending: Ophthalmology | Admitting: Ophthalmology

## 2022-01-18 ENCOUNTER — Other Ambulatory Visit: Payer: Self-pay

## 2022-01-18 ENCOUNTER — Encounter
Admission: RE | Disposition: A | Discharge status: Home / Self Care | Payer: Self-pay | Source: Home / Self Care | Attending: Ophthalmology

## 2022-01-18 DIAGNOSIS — D759 Disease of blood and blood-forming organs, unspecified: Secondary | ICD-10-CM | POA: Diagnosis not present

## 2022-01-18 DIAGNOSIS — Z902 Acquired absence of lung [part of]: Secondary | ICD-10-CM | POA: Diagnosis not present

## 2022-01-18 DIAGNOSIS — H2511 Age-related nuclear cataract, right eye: Secondary | ICD-10-CM | POA: Diagnosis not present

## 2022-01-18 DIAGNOSIS — Z85118 Personal history of other malignant neoplasm of bronchus and lung: Secondary | ICD-10-CM | POA: Diagnosis not present

## 2022-01-18 DIAGNOSIS — Z87891 Personal history of nicotine dependence: Secondary | ICD-10-CM | POA: Insufficient documentation

## 2022-01-18 DIAGNOSIS — E785 Hyperlipidemia, unspecified: Secondary | ICD-10-CM | POA: Diagnosis not present

## 2022-01-18 DIAGNOSIS — D649 Anemia, unspecified: Secondary | ICD-10-CM | POA: Diagnosis not present

## 2022-01-18 HISTORY — PX: CATARACT EXTRACTION W/PHACO: SHX586

## 2022-01-18 SURGERY — PHACOEMULSIFICATION, CATARACT, WITH IOL INSERTION
Anesthesia: Monitor Anesthesia Care | Site: Eye | Laterality: Right

## 2022-01-18 MED ORDER — LIDOCAINE HCL 2 % IJ SOLN
INTRAMUSCULAR | Status: DC | PRN
  Administered 2022-01-18: 4.4 mL via OPHTHALMIC

## 2022-01-18 MED ORDER — SIGHTPATH DOSE#1 BSS IO SOLN
INTRAOCULAR | Status: DC | PRN
  Administered 2022-01-18: 1 mL via INTRAMUSCULAR

## 2022-01-18 MED ORDER — SIGHTPATH DOSE#1 BSS IO SOLN
INTRAOCULAR | Status: DC | PRN
  Administered 2022-01-18: 15 mL

## 2022-01-18 MED ORDER — MOXIFLOXACIN HCL 0.5 % OP SOLN: OPHTHALMIC | Status: DC | PRN

## 2022-01-18 MED ORDER — SODIUM CHLORIDE 0.9% FLUSH
INTRAVENOUS | Status: DC | PRN
  Administered 2022-01-18: 10 mL via INTRAVENOUS

## 2022-01-18 MED ORDER — BRIMONIDINE TARTRATE-TIMOLOL 0.2-0.5 % OP SOLN
OPHTHALMIC | Status: DC | PRN
  Administered 2022-01-18: 1 [drp] via OPHTHALMIC

## 2022-01-18 MED ORDER — SIGHTPATH DOSE#1 BSS IO SOLN
INTRAOCULAR | Status: DC | PRN
  Administered 2022-01-18: 77 mL via OPHTHALMIC

## 2022-01-18 MED ORDER — SIGHTPATH DOSE#1 NA HYALUR & NA CHOND-NA HYALUR IO KIT
PACK | INTRAOCULAR | Status: DC | PRN
  Administered 2022-01-18: 1 via OPHTHALMIC

## 2022-01-18 MED ORDER — FENTANYL CITRATE (PF) 100 MCG/2ML IJ SOLN
INTRAMUSCULAR | Status: DC | PRN
  Administered 2022-01-18: 25 ug via INTRAVENOUS

## 2022-01-18 MED ORDER — ARMC OPHTHALMIC DILATING DROPS
1.0000 | OPHTHALMIC | Status: DC | PRN
  Administered 2022-01-18 (×3): 1 via OPHTHALMIC

## 2022-01-18 MED ORDER — LACTATED RINGERS IV SOLN: INTRAVENOUS | Status: DC

## 2022-01-18 MED ORDER — TETRACAINE HCL 0.5 % OP SOLN
1.0000 [drp] | OPHTHALMIC | Status: DC | PRN
  Administered 2022-01-18 (×3): 1 [drp] via OPHTHALMIC

## 2022-01-18 MED ORDER — PROPOFOL 10 MG/ML IV BOLUS
INTRAVENOUS | Status: DC | PRN
  Administered 2022-01-18: 20 mg via INTRAVENOUS

## 2022-01-18 MED ORDER — CEFUROXIME OPHTHALMIC INJECTION 1 MG/0.1 ML
INJECTION | OPHTHALMIC | Status: DC | PRN
  Administered 2022-01-18: .1 mL via INTRACAMERAL

## 2022-01-18 SURGICAL SUPPLY — 13 items
CATARACT SUITE SIGHTPATH (MISCELLANEOUS) ×1 IMPLANT
FEE CATARACT SUITE SIGHTPATH (MISCELLANEOUS) ×1 IMPLANT
GLOVE SRG 8 PF TXTR STRL LF DI (GLOVE) ×1 IMPLANT
GLOVE SURG ENC TEXT LTX SZ7.5 (GLOVE) ×1 IMPLANT
GLOVE SURG UNDER POLY LF SZ8 (GLOVE) ×1
LENS IOL TECNIS EYHANCE 20.5 (Intraocular Lens) IMPLANT
NDL FILTER BLUNT 18X1 1/2 (NEEDLE) ×1 IMPLANT
NDL RETROBULBAR 25GX1.5 STRL (NEEDLE) IMPLANT
NEEDLE FILTER BLUNT 18X1 1/2 (NEEDLE) ×1 IMPLANT
SPONGE SURG I SPEAR (MISCELLANEOUS) IMPLANT
SYR 3ML LL SCALE MARK (SYRINGE) ×1 IMPLANT
WATER STERILE IRR 250ML POUR (IV SOLUTION) ×1 IMPLANT
WICK EYE OCUCEL (MISCELLANEOUS) IMPLANT

## 2022-01-18 NOTE — Anesthesia Postprocedure Evaluation (Signed)
Anesthesia Post Note  Patient: Krista Evans  Procedure(s) Performed: CATARACT EXTRACTION PHACO AND INTRAOCULAR LENS PLACEMENT (IOC) RIGHT  19.67  01:42.2 (Right: Eye)  Patient location during evaluation: PACU Anesthesia Type: MAC Level of consciousness: awake and alert Pain management: pain level controlled Vital Signs Assessment: post-procedure vital signs reviewed and stable Respiratory status: spontaneous breathing, nonlabored ventilation, respiratory function stable and patient connected to nasal cannula oxygen Cardiovascular status: stable and blood pressure returned to baseline Postop Assessment: no apparent nausea or vomiting Anesthetic complications: no  No notable events documented.   Last Vitals:  Vitals:   01/18/22 1345 01/18/22 1350  BP: 123/62 115/63  Pulse: 72   Resp: 18   Temp: (!) 36.4 C (!) 36.2 C  SpO2: 99%     Last Pain:  Vitals:   01/18/22 1350  TempSrc:   PainSc: 0-No pain                 Dimas Millin

## 2022-01-18 NOTE — Op Note (Signed)
LOCATION:  [place]   PREOPERATIVE DIAGNOSIS:  Nuclear sclerotic cataract right eye.  H25.11   POSTOPERATIVE DIAGNOSIS:  Nuclear sclerotic cataract right eye.   PROCEDURE:  Phacoemulsification with posterior chamber intraocular lens implantation of the right eye.   LENS:  DIB00 20.5 D PCIOL   ULTRASOUND TIME:  1 minutes 42 seconds, CDE 19.7  SURGEON:  Wyonia Hough, MD   ANESTHESIA:  Retrobulbar block of Xylocaine and Bupivacaine plus intracameral lidocaine.   COMPLICATIONS:  None.   DESCRIPTION OF PROCEDURE:  The patient was identified in the holding room and transported to the operating room and placed in the supine position under the operating microscope.  The right eye was identified as the operative eye and a retrobulbar block was performed under intravenous sedation.  It was then prepped and draped in the usual sterile ophthalmic fashion.   A 1 millimeter clear-corneal paracentesis was made at the 12:00 position.  The anterior chamber was filled with Viscoat viscoelastic.  A 2.4 millimeter keratome was used to make a near-clear corneal incision at the 9:00 position.  A curvilinear capsulorrhexis was made with a cystotome and capsulorrhexis forceps.  Balanced salt solution was used to hydrodissect and hydrodelineate the nucleus.   Phacoemulsification was then used in stop and chop fashion to remove the lens nucleus and epinucleus.  The remaining cortex was then removed using the irrigation and aspiration handpiece.  Provisc was then placed into the capsular bag to distend it for lens placement.  A 20.5 -diopter lens was then injected into the capsular bag.  The remaining viscoelastic was aspirated.   Wounds were hydrated with balanced salt solution.  The anterior chamber was inflated to a physiologic pressure with balanced salt solution.  No wound leaks were noted. Cefuroxime 0.1 ml of a 10mg /ml solution was injected into the anterior chamber for a dose of 1 mg of  intracameral antibiotic at the completion of the case.   Timolol and Brimonidine drops were placed followed by erythromycin ointment.  The eye was patched.  The patient was taken to the recovery room in stable condition without complications of anesthesia or surgery.     Krista Evans 01/18/2022, 1:43 PM

## 2022-01-18 NOTE — Transfer of Care (Signed)
Immediate Anesthesia Transfer of Care Note  Patient: Krista Evans  Procedure(s) Performed: CATARACT EXTRACTION PHACO AND INTRAOCULAR LENS PLACEMENT (IOC) RIGHT  19.67  01:42.2 (Right: Eye)  Patient Location: PACU  Anesthesia Type: MAC  Level of Consciousness: awake, alert  and patient cooperative  Airway and Oxygen Therapy: Patient Spontanous Breathing and Patient connected to supplemental oxygen  Post-op Assessment: Post-op Vital signs reviewed, Patient's Cardiovascular Status Stable, Respiratory Function Stable, Patent Airway and No signs of Nausea or vomiting  Post-op Vital Signs: Reviewed and stable  Complications: No notable events documented.

## 2022-01-18 NOTE — Anesthesia Preprocedure Evaluation (Signed)
Anesthesia Evaluation  Patient identified by MRN, date of birth, ID band Patient awake    Reviewed: Allergy & Precautions, NPO status , Patient's Chart, lab work & pertinent test results  History of Anesthesia Complications Negative for: history of anesthetic complications  Airway Mallampati: II  TM Distance: >3 FB Neck ROM: Full    Dental  (+) Upper Dentures, Lower Dentures   Pulmonary former smoker Right upper Lung CA with partial Lobectomy.   breath sounds clear to auscultation       Cardiovascular Exercise Tolerance: Poor  Rhythm:Regular Rate:Normal     Neuro/Psych Hydrocephalus s/p VP shunt H/o meningioma Parkinson's.  Reports left sided weakness. Son reports she was evaluated for stroke. No new neuro deficits.     GI/Hepatic Neg liver ROS,neg GERD  ,,celiac   Endo/Other  negative endocrine ROS    Renal/GU negative Renal ROS     Musculoskeletal  (+) Arthritis , Osteoarthritis,    Abdominal Normal abdominal exam  (+)   Peds  Hematology  (+) Blood dyscrasia (Hb 11.4), anemia   Anesthesia Other Findings   Reproductive/Obstetrics                              Anesthesia Physical Anesthesia Plan  ASA: 3  Anesthesia Plan: MAC   Post-op Pain Management:    Induction: Intravenous  PONV Risk Score and Plan: 3 and TIVA, Midazolam and Treatment may vary due to age or medical condition  Airway Management Planned: Natural Airway and Nasal Cannula  Additional Equipment:   Intra-op Plan:   Post-operative Plan:   Informed Consent: I have reviewed the patients History and Physical, chart, labs and discussed the procedure including the risks, benefits and alternatives for the proposed anesthesia with the patient or authorized representative who has indicated his/her understanding and acceptance.     Dental Advisory Given  Plan Discussed with: Anesthesiologist, CRNA and  Surgeon  Anesthesia Plan Comments: (Patient consented for risks of anesthesia including but not limited to:  - adverse reactions to medications - damage to eyes, teeth, lips or other oral mucosa - nerve damage due to positioning  - sore throat or hoarseness - Damage to heart, brain, nerves, lungs, other parts of body or loss of life  Patient voiced understanding.)         Anesthesia Quick Evaluation

## 2022-01-18 NOTE — H&P (Signed)
[facility]   Primary Care Physician:  Adin Hector, MD Ophthalmologist: Dr. Leandrew Koyanagi  Pre-Procedure History & Physical: HPI:  Krista Evans is a 77 y.o. female here for ophthalmic surgery.   Past Medical History:  Diagnosis Date   AAA (abdominal aortic aneurysm) ([place]) 08/2020   without rupture; 3.3 cm by CT 08/2020   Abnormal Q waves on electrocardiogram    Anemia    followed by Dr Oliva Bustard   Aortic atherosclerosis [facility])    "I could possibly have it, my grandmother had it"   Cancer ([place]) 05/2011   Right upper Lung CA with partial Lobectomy.   Celiac disease    Celiac disease    by endoscopy   Dyspnea    with exertion   History of pneumonia    Hyperlipidemia    Hyperlipidemia    Lung mass    right upper lobe lung mass, 3.3x2.7 cm by CT 5/13. Resected by Dr Marta Lamas 6/13; path with adenocarcinoma, stage T3, with close but negative margins. S/p chemotherapy and radiation therapy.   Meningioma (HCC)    right frontal meningioma, 15 mm x 5 mm, on MRI 3/19   Onychomycosis    followed by Dr Milinda Pointer. Lamisil treatment initiated 3/11   Osteoporosis    age related   Personal history of chemotherapy    Personal history of radiation therapy    Thoracic aortic ectasia ([place]) 09/15/2020   3.8 cm by CT 8/22    Past Surgical History:  Procedure Laterality Date   CATARACT EXTRACTION W/PHACO Left 01/04/2022   Procedure: CATARACT EXTRACTION PHACO AND INTRAOCULAR LENS PLACEMENT ([place]) LEFT;  Surgeon: Leandrew Koyanagi, MD;  Location: [place];  Service: Ophthalmology;  Laterality: Left;  11.70 1:21.4   COLONOSCOPY     ESOPHAGOGASTRODUODENOSCOPY     LUNG LOBECTOMY     right lung   VENTRICULOPERITONEAL SHUNT Right 07/03/2017   Procedure: SHUNT INSERTION VENTRICULAR-PERITONEAL;  Surgeon: Newman Pies, MD;  Location: [place];  Service: Neurosurgery;  Laterality: Right;    Prior to Admission medications   Medication Sig Start Date End Date Taking?  Authorizing Provider  acetaminophen (TYLENOL) 325 MG tablet Take 650 mg by mouth every 4 (four) hours as needed. Patient states she takes as needed.   Yes [provider]  alendronate (FOSAMAX) 70 MG tablet Take 70 mg by mouth once a week. Take with a full glass of water on an empty stomach.   Yes [provider]  Calcium Carb-Cholecalciferol (CALCIUM 600 + D PO) Take by mouth daily.   Yes [provider]  calcium carbonate (TUMS - DOSED IN MG ELEMENTAL CALCIUM) 500 MG chewable tablet Chew 2 tablets by mouth daily.    Yes [provider]  Cholecalciferol (VITAMIN D3) 125 MCG (5000 UT) CAPS Take by mouth.   Yes [provider]  docusate sodium (COLACE) 100 MG capsule Take 1 capsule (100 mg total) by mouth 2 (two) times daily. Patient taking differently: Take 100 mg by mouth as needed for mild constipation. 07/13/17  Yes Angiulli, Lavon Paganini, PA-C  Multiple Vitamin (MULTI-VITAMINS) TABS Take 1 tablet by mouth daily.    Yes [provider]  rosuvastatin (CRESTOR) 5 MG tablet Take 5 mg by mouth daily.   Yes [provider]  triamcinolone (KENALOG) 0.025 % cream Apply 1 Application topically 2 (two) times daily.   Yes [provider]  HYDROcodone-acetaminophen (NORCO/VICODIN) 5-325 MG tablet Take 1 tablet by mouth every 6 (six)  hours as needed for severe pain. Patient not taking: Reported on 12/29/2021 07/13/17   Cathlyn Parsons, PA-C    Allergies as of 12/01/2021 - Review Complete 08/15/2021  Allergen Reaction Noted   Barley grass Other (See Comments) 08/04/2014   Gluten meal Other (See Comments)    Wheat bran Other (See Comments) 08/04/2014    Family History  Problem Relation Age of Onset   Asthma Mother    Heart disease Mother    Hypertension Mother    COPD Father    CAD Sister    Diabetes Maternal Grandmother    Diabetes Maternal Grandfather    Breast cancer Cousin 33   Parkinson's disease Neg Hx     Social  History   Socioeconomic History   Marital status: Married    Spouse name: Not on file   Number of children: Not on file   Years of education: Not on file   Highest education level: Not on file  Occupational History   Not on file  Tobacco Use   Smoking status: Former    Types: Cigarettes    Quit date: 02/28/1986    Years since quitting: 35.9   Smokeless tobacco: Never  Vaping Use   Vaping Use: Never used  Substance and Sexual Activity   Alcohol use: No    Alcohol/week: 0.0 standard drinks of alcohol   Drug use: No   Sexual activity: Not on file  Other Topics Concern   Not on file  Social History Narrative   Lives at home with husband   Social Determinants of Health   Financial Resource Strain: Not on file  Food Insecurity: Not on file  Transportation Needs: Not on file  Physical Activity: Not on file  Stress: Not on file  Social Connections: Not on file  Intimate Partner Violence: Not on file    Review of Systems: See HPI, otherwise negative ROS  Physical Exam: BP 127/76   Pulse 87   Temp 99.9 F (37.7 C) (Temporal)   Resp 17   Ht 5\' 5"  (1.651 m)   Wt 45.4 kg   SpO2 95%   BMI 16.64 kg/m  General:   Alert,  pleasant and cooperative in NAD Head:  Normocephalic and atraumatic. Lungs:  Clear to auscultation.    Heart:  Regular rate and rhythm.   Impression/Plan: [name] is here for ophthalmic surgery.  Risks, benefits, limitations, and alternatives regarding ophthalmic surgery have been reviewed with the patient.  Questions have been answered.  All parties agreeable.   Leandrew Koyanagi, MD  01/18/2022, 12:41 PM

## 2022-01-19 ENCOUNTER — Encounter: Payer: Self-pay | Admitting: Ophthalmology

## 2022-01-20 DIAGNOSIS — R471 Dysarthria and anarthria: Secondary | ICD-10-CM | POA: Diagnosis not present

## 2022-01-20 DIAGNOSIS — E441 Mild protein-calorie malnutrition: Secondary | ICD-10-CM | POA: Diagnosis not present

## 2022-01-20 DIAGNOSIS — Z85118 Personal history of other malignant neoplasm of bronchus and lung: Secondary | ICD-10-CM | POA: Diagnosis not present

## 2022-01-20 DIAGNOSIS — R2681 Unsteadiness on feet: Secondary | ICD-10-CM | POA: Diagnosis not present

## 2022-01-20 DIAGNOSIS — Z9221 Personal history of antineoplastic chemotherapy: Secondary | ICD-10-CM | POA: Diagnosis not present

## 2022-01-20 DIAGNOSIS — Z923 Personal history of irradiation: Secondary | ICD-10-CM | POA: Diagnosis not present

## 2022-01-20 DIAGNOSIS — M6281 Muscle weakness (generalized): Secondary | ICD-10-CM | POA: Diagnosis not present

## 2022-01-20 DIAGNOSIS — G912 (Idiopathic) normal pressure hydrocephalus: Secondary | ICD-10-CM | POA: Diagnosis not present

## 2022-01-20 DIAGNOSIS — M810 Age-related osteoporosis without current pathological fracture: Secondary | ICD-10-CM | POA: Diagnosis not present

## 2022-01-20 DIAGNOSIS — I714 Abdominal aortic aneurysm, without rupture, unspecified: Secondary | ICD-10-CM | POA: Diagnosis not present

## 2022-02-10 DIAGNOSIS — Z961 Presence of intraocular lens: Secondary | ICD-10-CM | POA: Diagnosis not present

## 2022-02-23 DIAGNOSIS — R471 Dysarthria and anarthria: Secondary | ICD-10-CM | POA: Diagnosis not present

## 2022-02-23 DIAGNOSIS — Z85118 Personal history of other malignant neoplasm of bronchus and lung: Secondary | ICD-10-CM | POA: Diagnosis not present

## 2022-02-23 DIAGNOSIS — Z923 Personal history of irradiation: Secondary | ICD-10-CM | POA: Diagnosis not present

## 2022-02-23 DIAGNOSIS — R2681 Unsteadiness on feet: Secondary | ICD-10-CM | POA: Diagnosis not present

## 2022-02-23 DIAGNOSIS — M6281 Muscle weakness (generalized): Secondary | ICD-10-CM | POA: Diagnosis not present

## 2022-02-23 DIAGNOSIS — I714 Abdominal aortic aneurysm, without rupture, unspecified: Secondary | ICD-10-CM | POA: Diagnosis not present

## 2022-02-23 DIAGNOSIS — G912 (Idiopathic) normal pressure hydrocephalus: Secondary | ICD-10-CM | POA: Diagnosis not present

## 2022-02-23 DIAGNOSIS — E441 Mild protein-calorie malnutrition: Secondary | ICD-10-CM | POA: Diagnosis not present

## 2022-02-23 DIAGNOSIS — Z9221 Personal history of antineoplastic chemotherapy: Secondary | ICD-10-CM | POA: Diagnosis not present

## 2022-02-23 DIAGNOSIS — M810 Age-related osteoporosis without current pathological fracture: Secondary | ICD-10-CM | POA: Diagnosis not present

## 2022-06-17 IMAGING — CT CT HEAD W/O CM
3 series · 15 of 47 positions shown, 18 images · non-contrast
Comparison: MRI October 26, 2020.  CT head October 28, 2019.

CLINICAL DATA: Communicating hydrocephalus (HCC) OMI.6 ([id number]-CM)

EXAM:
CT HEAD WITHOUT CONTRAST
TECHNIQUE: Contiguous axial images were obtained from the base of the skull
through the vertex without intravenous contrast.

[Series 2: head wo · axial · 0.40mm/px · z∈[-72,+53]mm · 9 of 31 slices shown, 12 images]
[im 3/31  brain]
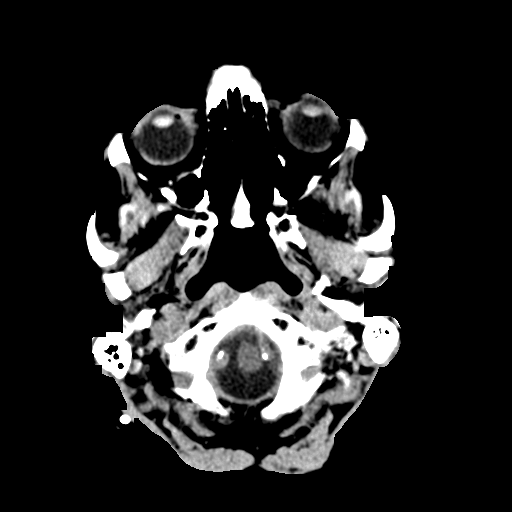
[im 3/31  bone]
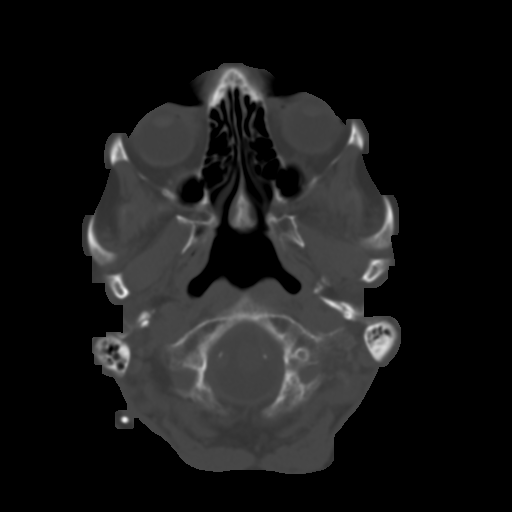
[im 6/31  brain]
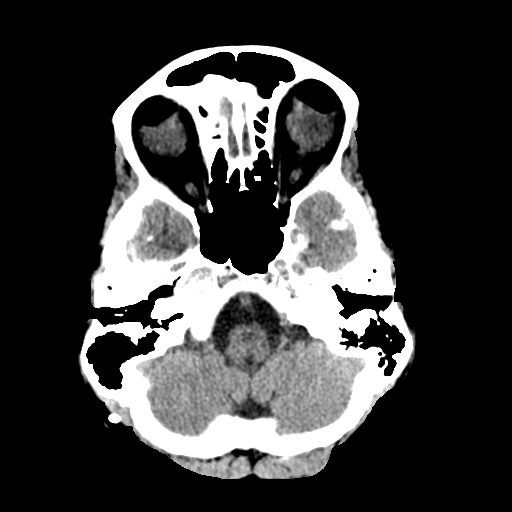
[im 9/31  brain]
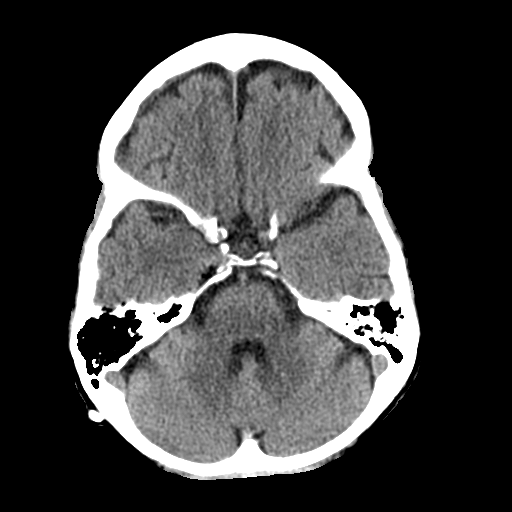
[im 12/31  brain]
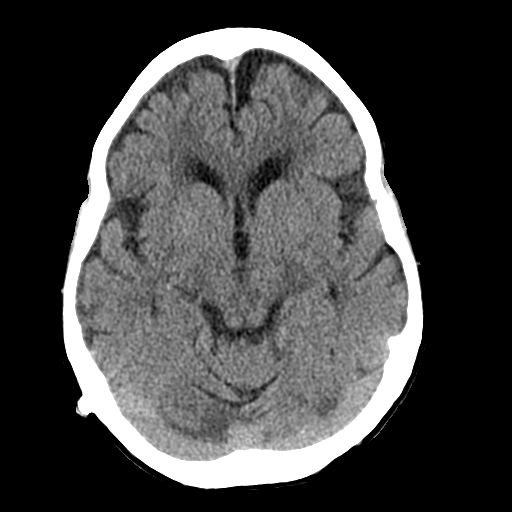
[im 16/31  brain]
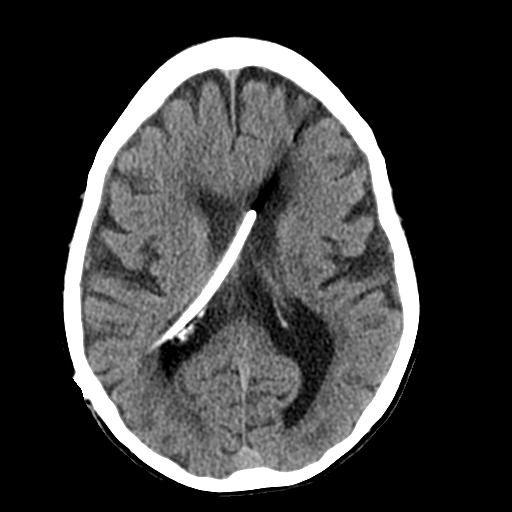
[im 16/31  bone]
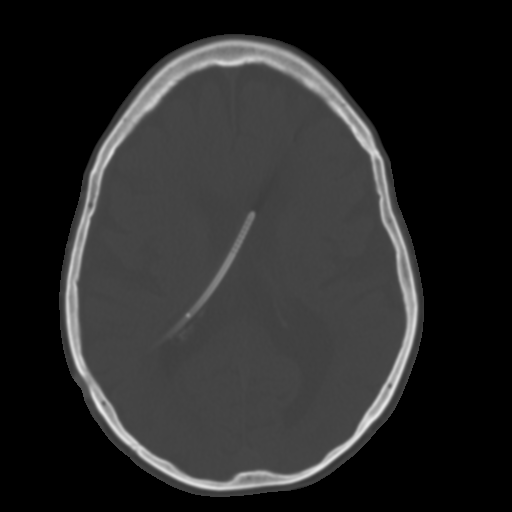
[im 19/31  brain]
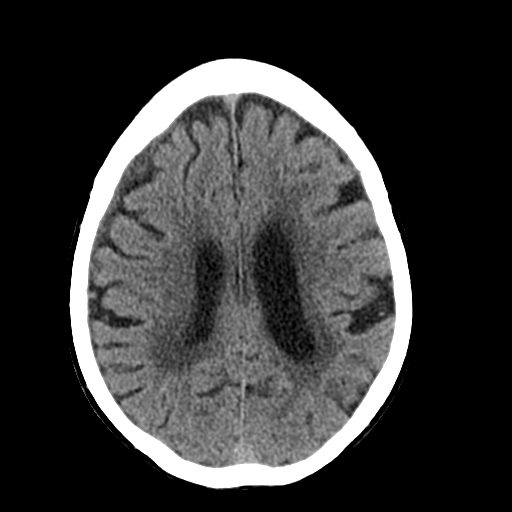
[im 22/31  brain]
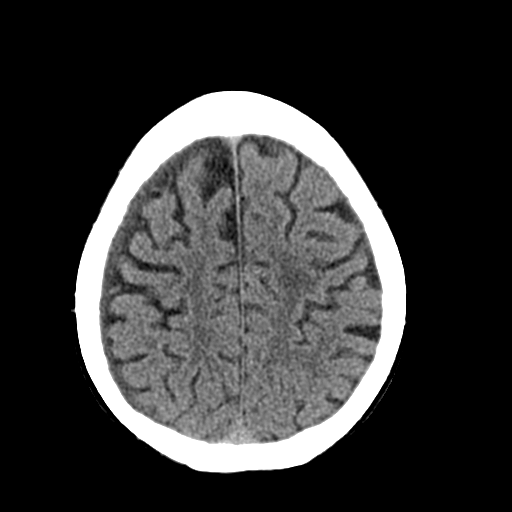
[im 25/31  brain]
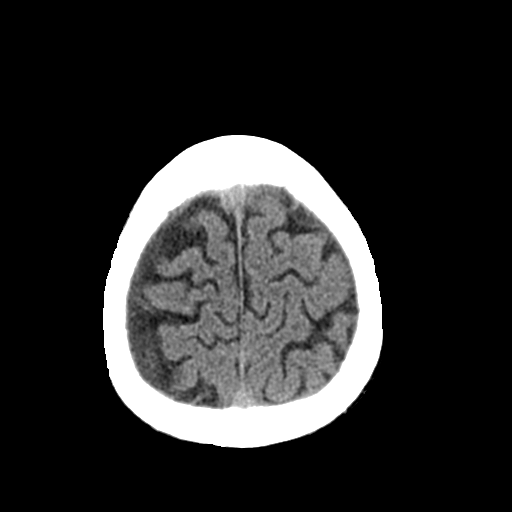
[im 28/31  brain]
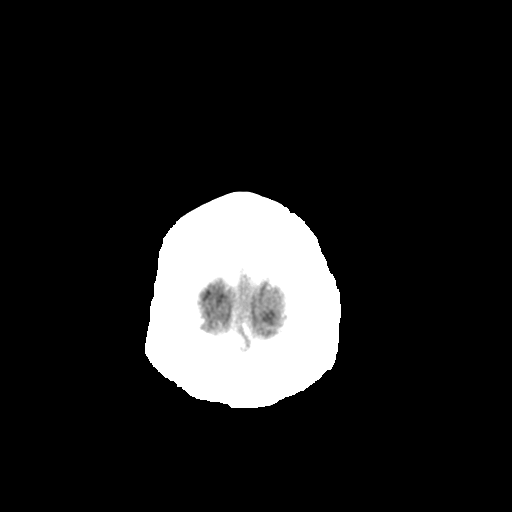
[im 28/31  bone]
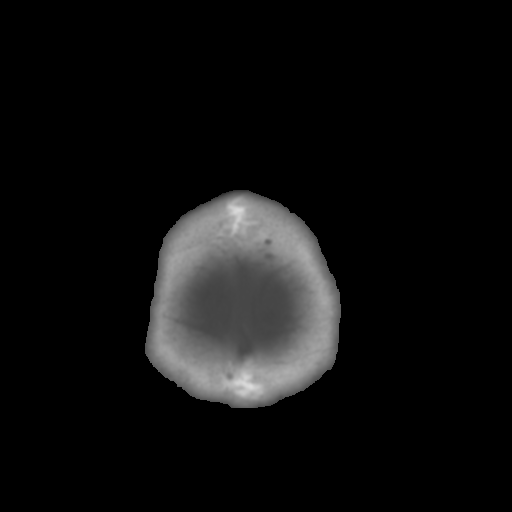

[Series 4: coronal soft tissue · coronal · 0.33mm/px · 3 of 67 slices shown]
[im 23/67  brain]
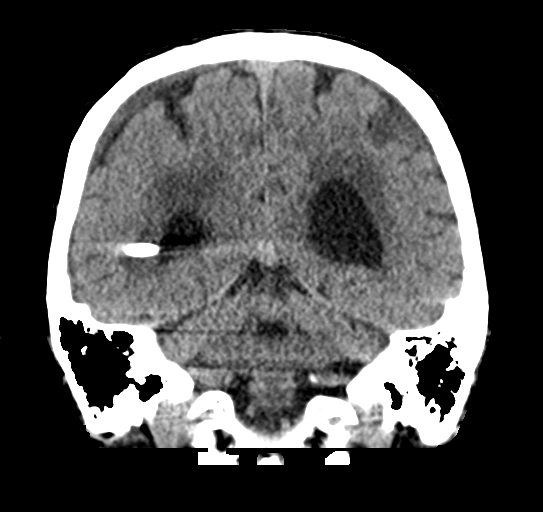
[im 30/67  brain]
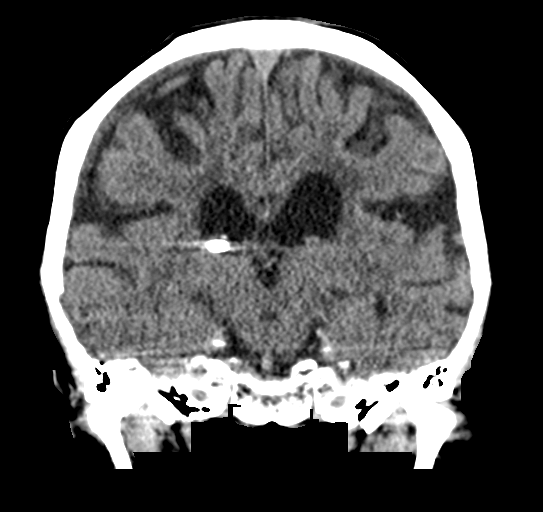
[im 37/67  brain]
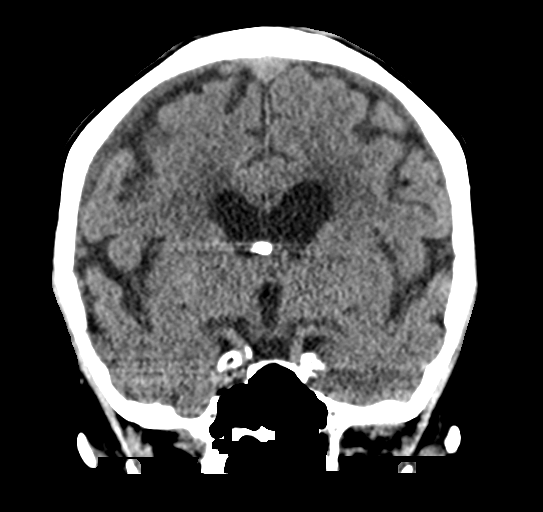

[Series 5: sagittal soft tissue · sagittal · 0.33mm/px · 3 of 59 slices shown]
[im 20/59  brain]
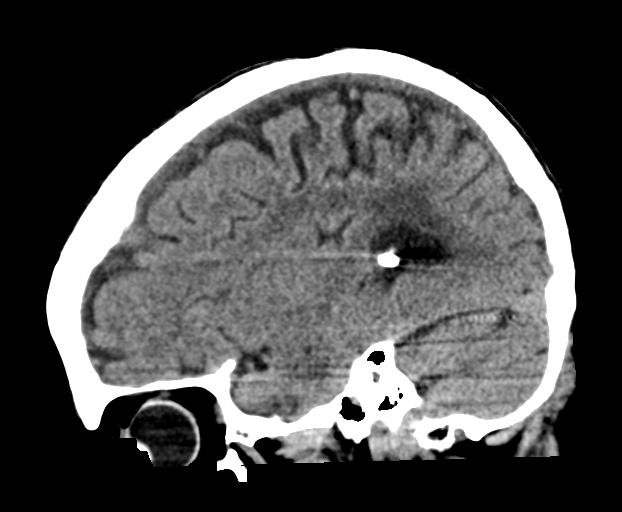
[im 30/59  brain]
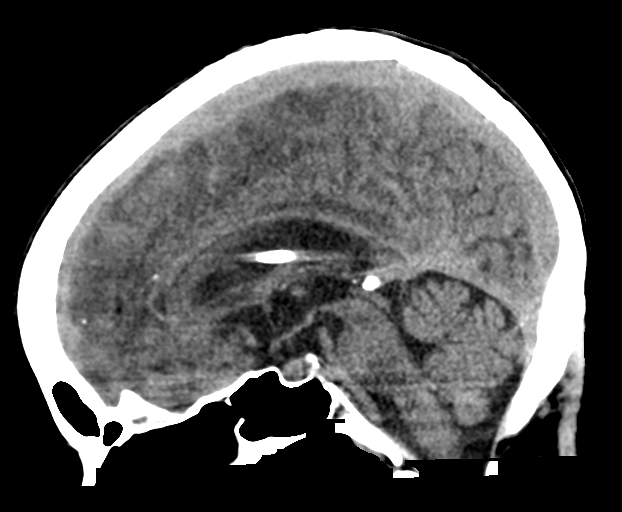
[im 39/59  brain]
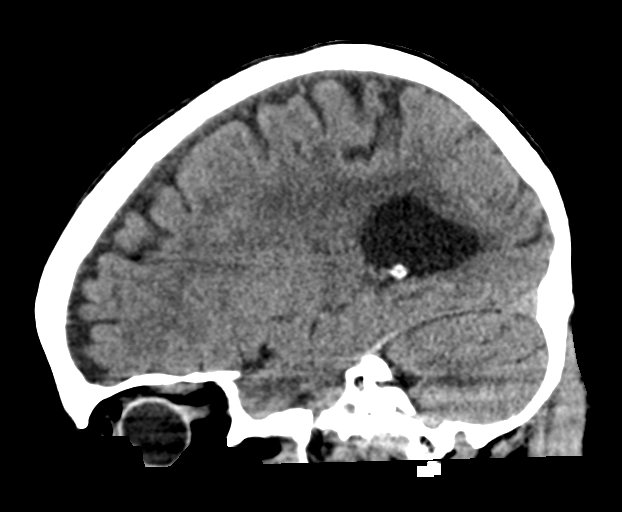

[15 of 47 positions shown; findings below may reference images not displayed]

FINDINGS: Brain: When comparing across modalities to MRI from October 26, 2020, similar size of the ventricular system. For example, the
lateral ventricles measure approximately 37 mm in transverse
dimension on coronal image 33, similar when measured in a similar
location on the prior. Redemonstrated bilateral subdural
collections, which are similar in size (when remeasured on the
prior) measuring up to 5 mm in thickness on the right and 3 mm on
the left. Right parietal approach ventricular catheter which crosses
midline. Visualized portions of the catheter appear intact. No
evidence of acute large vascular territory infarct, acute
hemorrhage, mass lesion or midline shift. Periventricular
hypodensity, similar to prior.

Vascular: No hyperdense vessel identified. Calcific intracranial
atherosclerosis.

Skull: No acute fracture.  Right parietal burr hole.

Sinuses/Orbits: Visualized sinuses are clear.  Unremarkable orbits.

Other: No mastoid effusions.
IMPRESSION: 1. When comparing across modalities to recent MRI from October 26, 2020, similar size of the ventricular system with ventricular
catheter in place.
2. Similar bilateral subdural collections, measuring up to 5 mm in
thickness on the right and 3 mm on the left. While nonspecific, this
finding can be seen with over shunting in the correct clinical
setting.

## 2022-07-14 DIAGNOSIS — G912 (Idiopathic) normal pressure hydrocephalus: Secondary | ICD-10-CM | POA: Diagnosis not present

## 2022-07-14 DIAGNOSIS — E441 Mild protein-calorie malnutrition: Secondary | ICD-10-CM | POA: Diagnosis not present

## 2022-07-14 DIAGNOSIS — E7849 Other hyperlipidemia: Secondary | ICD-10-CM | POA: Diagnosis not present

## 2022-07-14 DIAGNOSIS — D649 Anemia, unspecified: Secondary | ICD-10-CM | POA: Diagnosis not present

## 2022-07-21 DIAGNOSIS — G912 (Idiopathic) normal pressure hydrocephalus: Secondary | ICD-10-CM | POA: Diagnosis not present

## 2022-07-21 DIAGNOSIS — I714 Abdominal aortic aneurysm, without rupture, unspecified: Secondary | ICD-10-CM | POA: Diagnosis not present

## 2022-07-21 DIAGNOSIS — D329 Benign neoplasm of meninges, unspecified: Secondary | ICD-10-CM | POA: Diagnosis not present

## 2022-07-21 DIAGNOSIS — D649 Anemia, unspecified: Secondary | ICD-10-CM | POA: Diagnosis not present

## 2022-07-21 DIAGNOSIS — I77810 Thoracic aortic ectasia: Secondary | ICD-10-CM | POA: Diagnosis not present

## 2022-07-21 DIAGNOSIS — E7849 Other hyperlipidemia: Secondary | ICD-10-CM | POA: Diagnosis not present

## 2022-07-21 DIAGNOSIS — I700 Atherosclerosis of aorta: Secondary | ICD-10-CM | POA: Diagnosis not present

## 2022-07-21 DIAGNOSIS — K900 Celiac disease: Secondary | ICD-10-CM | POA: Diagnosis not present

## 2022-07-21 DIAGNOSIS — E441 Mild protein-calorie malnutrition: Secondary | ICD-10-CM | POA: Diagnosis not present

## 2022-07-21 DIAGNOSIS — M810 Age-related osteoporosis without current pathological fracture: Secondary | ICD-10-CM | POA: Diagnosis not present

## 2022-07-21 DIAGNOSIS — Z85118 Personal history of other malignant neoplasm of bronchus and lung: Secondary | ICD-10-CM | POA: Diagnosis not present

## 2022-07-24 DIAGNOSIS — I714 Abdominal aortic aneurysm, without rupture, unspecified: Secondary | ICD-10-CM | POA: Diagnosis not present

## 2022-07-24 DIAGNOSIS — D649 Anemia, unspecified: Secondary | ICD-10-CM | POA: Diagnosis not present

## 2022-07-24 DIAGNOSIS — G912 (Idiopathic) normal pressure hydrocephalus: Secondary | ICD-10-CM | POA: Diagnosis not present

## 2022-07-24 DIAGNOSIS — I700 Atherosclerosis of aorta: Secondary | ICD-10-CM | POA: Diagnosis not present

## 2022-07-24 DIAGNOSIS — I77810 Thoracic aortic ectasia: Secondary | ICD-10-CM | POA: Diagnosis not present

## 2022-07-24 DIAGNOSIS — K900 Celiac disease: Secondary | ICD-10-CM | POA: Diagnosis not present

## 2022-07-24 DIAGNOSIS — E441 Mild protein-calorie malnutrition: Secondary | ICD-10-CM | POA: Diagnosis not present

## 2022-07-24 DIAGNOSIS — E7849 Other hyperlipidemia: Secondary | ICD-10-CM | POA: Diagnosis not present

## 2022-07-24 DIAGNOSIS — M810 Age-related osteoporosis without current pathological fracture: Secondary | ICD-10-CM | POA: Diagnosis not present

## 2022-07-24 DIAGNOSIS — D329 Benign neoplasm of meninges, unspecified: Secondary | ICD-10-CM | POA: Diagnosis not present

## 2022-08-03 DIAGNOSIS — D649 Anemia, unspecified: Secondary | ICD-10-CM | POA: Diagnosis not present

## 2022-08-03 DIAGNOSIS — I700 Atherosclerosis of aorta: Secondary | ICD-10-CM | POA: Diagnosis not present

## 2022-08-03 DIAGNOSIS — G912 (Idiopathic) normal pressure hydrocephalus: Secondary | ICD-10-CM | POA: Diagnosis not present

## 2022-08-03 DIAGNOSIS — M810 Age-related osteoporosis without current pathological fracture: Secondary | ICD-10-CM | POA: Diagnosis not present

## 2022-08-03 DIAGNOSIS — I714 Abdominal aortic aneurysm, without rupture, unspecified: Secondary | ICD-10-CM | POA: Diagnosis not present

## 2022-08-03 DIAGNOSIS — I77810 Thoracic aortic ectasia: Secondary | ICD-10-CM | POA: Diagnosis not present

## 2022-08-03 DIAGNOSIS — E7849 Other hyperlipidemia: Secondary | ICD-10-CM | POA: Diagnosis not present

## 2022-09-27 DIAGNOSIS — G912 (Idiopathic) normal pressure hydrocephalus: Secondary | ICD-10-CM | POA: Diagnosis not present

## 2022-09-27 DIAGNOSIS — I77810 Thoracic aortic ectasia: Secondary | ICD-10-CM | POA: Diagnosis not present

## 2022-09-27 DIAGNOSIS — M810 Age-related osteoporosis without current pathological fracture: Secondary | ICD-10-CM | POA: Diagnosis not present

## 2022-09-27 DIAGNOSIS — I714 Abdominal aortic aneurysm, without rupture, unspecified: Secondary | ICD-10-CM | POA: Diagnosis not present

## 2022-09-27 DIAGNOSIS — D649 Anemia, unspecified: Secondary | ICD-10-CM | POA: Diagnosis not present

## 2022-09-27 DIAGNOSIS — E7849 Other hyperlipidemia: Secondary | ICD-10-CM | POA: Diagnosis not present

## 2022-09-27 DIAGNOSIS — I700 Atherosclerosis of aorta: Secondary | ICD-10-CM | POA: Diagnosis not present

## 2022-11-02 ENCOUNTER — Ambulatory Visit: Payer: Self-pay

## 2022-11-02 ENCOUNTER — Ambulatory Visit: Payer: PPO

## 2022-11-02 DIAGNOSIS — Z23 Encounter for immunization: Secondary | ICD-10-CM

## 2022-11-02 DIAGNOSIS — Z719 Counseling, unspecified: Secondary | ICD-10-CM

## 2022-11-02 NOTE — Progress Notes (Signed)
Homevisit for flu vaccine.  See vaccine administration screening questions (sent for scanning).   VIS given.  Post vaccine care reviewed with pt and husband.  Fluzone high dose IIV3 PF administered by Marin Roberts RN; tolerated well.  [facility] updated and copy given.  Cherlynn Polo, RN

## 2023-03-17 DEATH — deceased
